# Patient Record
Sex: Male | Born: 1935
Health system: Southern US, Community
[De-identification: ages and names within clinical notes are randomized; demographics above are authoritative.]

## PROBLEM LIST (undated history)

## (undated) DIAGNOSIS — C189 Malignant neoplasm of colon, unspecified: Secondary | ICD-10-CM

## (undated) DIAGNOSIS — I509 Heart failure, unspecified: Secondary | ICD-10-CM

## (undated) DIAGNOSIS — F32A Depression, unspecified: Secondary | ICD-10-CM

## (undated) DIAGNOSIS — F329 Major depressive disorder, single episode, unspecified: Secondary | ICD-10-CM

## (undated) DIAGNOSIS — D649 Anemia, unspecified: Secondary | ICD-10-CM

## (undated) DIAGNOSIS — E119 Type 2 diabetes mellitus without complications: Secondary | ICD-10-CM

## (undated) DIAGNOSIS — I739 Peripheral vascular disease, unspecified: Secondary | ICD-10-CM

## (undated) DIAGNOSIS — I10 Essential (primary) hypertension: Secondary | ICD-10-CM

## (undated) DIAGNOSIS — J449 Chronic obstructive pulmonary disease, unspecified: Secondary | ICD-10-CM

## (undated) DIAGNOSIS — R0600 Dyspnea, unspecified: Secondary | ICD-10-CM

## (undated) DIAGNOSIS — E785 Hyperlipidemia, unspecified: Secondary | ICD-10-CM

## (undated) DIAGNOSIS — J189 Pneumonia, unspecified organism: Secondary | ICD-10-CM

## (undated) DIAGNOSIS — F0280 Dementia in other diseases classified elsewhere without behavioral disturbance: Secondary | ICD-10-CM

## (undated) DIAGNOSIS — G309 Alzheimer's disease, unspecified: Secondary | ICD-10-CM

## (undated) HISTORY — DX: Essential (primary) hypertension: I10

## (undated) HISTORY — DX: Dyspnea, unspecified: R06.00

## (undated) HISTORY — DX: Hyperlipidemia, unspecified: E78.5

## (undated) HISTORY — PX: SPINE SURGERY: SHX786

## (undated) HISTORY — DX: Pneumonia, unspecified organism: J18.9

## (undated) HISTORY — DX: Peripheral vascular disease, unspecified: I73.9

## (undated) HISTORY — PX: FEMORAL BYPASS: SHX50

## (undated) HISTORY — DX: Dementia in other diseases classified elsewhere without behavioral disturbance: F02.80

## (undated) HISTORY — PX: FEMORAL ARTERY - FEMORAL ARTERY BYPASS GRAFT: SUR179

## (undated) HISTORY — DX: Alzheimer's disease, unspecified: G30.9

## (undated) HISTORY — DX: Malignant neoplasm of colon, unspecified: C18.9

## (undated) HISTORY — PX: VASCULAR SURGERY: SHX849

## (undated) HISTORY — DX: Chronic obstructive pulmonary disease, unspecified: J44.9

## (undated) HISTORY — PX: FIBEROPTIC BRONCHOSCOPY: SHX5367

---

## 1994-03-26 HISTORY — PX: COLON SURGERY: SHX602

## 1995-03-27 HISTORY — PX: COLON RESECTION: SHX5231

## 1999-12-28 ENCOUNTER — Ambulatory Visit (HOSPITAL_COMMUNITY): Admission: RE | Admit: 1999-12-28 | Discharge: 1999-12-28 | Payer: Self-pay | Admitting: *Deleted

## 1999-12-28 ENCOUNTER — Encounter (INDEPENDENT_AMBULATORY_CARE_PROVIDER_SITE_OTHER): Payer: Self-pay | Admitting: *Deleted

## 2000-01-11 ENCOUNTER — Ambulatory Visit (HOSPITAL_COMMUNITY): Admission: RE | Admit: 2000-01-11 | Discharge: 2000-01-11 | Payer: Self-pay | Admitting: Family Medicine

## 2000-05-05 ENCOUNTER — Encounter: Payer: Self-pay | Admitting: Internal Medicine

## 2000-05-05 ENCOUNTER — Emergency Department (HOSPITAL_COMMUNITY): Admission: EM | Admit: 2000-05-05 | Discharge: 2000-05-05 | Payer: Self-pay | Admitting: Emergency Medicine

## 2001-03-11 ENCOUNTER — Encounter: Payer: Self-pay | Admitting: Vascular Surgery

## 2001-03-13 ENCOUNTER — Ambulatory Visit (HOSPITAL_COMMUNITY): Admission: RE | Admit: 2001-03-13 | Discharge: 2001-03-13 | Payer: Self-pay | Admitting: Vascular Surgery

## 2001-04-02 ENCOUNTER — Encounter: Payer: Self-pay | Admitting: Vascular Surgery

## 2001-04-02 ENCOUNTER — Inpatient Hospital Stay (HOSPITAL_COMMUNITY): Admission: RE | Admit: 2001-04-02 | Discharge: 2001-04-05 | Payer: Self-pay | Admitting: Vascular Surgery

## 2003-01-19 ENCOUNTER — Ambulatory Visit (HOSPITAL_COMMUNITY): Admission: RE | Admit: 2003-01-19 | Discharge: 2003-01-19 | Payer: Self-pay | Admitting: Gastroenterology

## 2003-01-19 ENCOUNTER — Encounter (INDEPENDENT_AMBULATORY_CARE_PROVIDER_SITE_OTHER): Payer: Self-pay | Admitting: *Deleted

## 2003-03-27 HISTORY — PX: LUMBAR DISC SURGERY: SHX700

## 2003-08-24 ENCOUNTER — Emergency Department (HOSPITAL_COMMUNITY): Admission: EM | Admit: 2003-08-24 | Discharge: 2003-08-24 | Payer: Self-pay | Admitting: Emergency Medicine

## 2004-05-14 ENCOUNTER — Inpatient Hospital Stay (HOSPITAL_COMMUNITY): Admission: EM | Admit: 2004-05-14 | Discharge: 2004-05-16 | Payer: Self-pay | Admitting: Emergency Medicine

## 2004-12-02 ENCOUNTER — Emergency Department (HOSPITAL_COMMUNITY): Admission: EM | Admit: 2004-12-02 | Discharge: 2004-12-02 | Payer: Self-pay | Admitting: Emergency Medicine

## 2005-03-14 ENCOUNTER — Encounter: Payer: Self-pay | Admitting: Emergency Medicine

## 2005-03-15 ENCOUNTER — Inpatient Hospital Stay (HOSPITAL_COMMUNITY): Admission: EM | Admit: 2005-03-15 | Discharge: 2005-03-17 | Payer: Self-pay | Admitting: Internal Medicine

## 2005-03-15 ENCOUNTER — Ambulatory Visit: Payer: Self-pay | Admitting: Pulmonary Disease

## 2005-03-16 ENCOUNTER — Ambulatory Visit: Payer: Self-pay | Admitting: Cardiovascular Disease

## 2005-06-12 ENCOUNTER — Ambulatory Visit: Payer: Self-pay

## 2006-01-28 ENCOUNTER — Ambulatory Visit: Payer: Self-pay | Admitting: Internal Medicine

## 2006-02-17 ENCOUNTER — Ambulatory Visit: Payer: Self-pay | Admitting: Emergency Medicine

## 2006-02-17 ENCOUNTER — Inpatient Hospital Stay (HOSPITAL_COMMUNITY): Admission: EM | Admit: 2006-02-17 | Discharge: 2006-02-27 | Payer: Self-pay | Admitting: Emergency Medicine

## 2006-03-06 ENCOUNTER — Ambulatory Visit: Payer: Self-pay | Admitting: Internal Medicine

## 2006-03-17 ENCOUNTER — Emergency Department (HOSPITAL_COMMUNITY): Admission: EM | Admit: 2006-03-17 | Discharge: 2006-03-17 | Payer: Self-pay | Admitting: Emergency Medicine

## 2006-03-21 ENCOUNTER — Ambulatory Visit: Payer: Self-pay | Admitting: Critical Care Medicine

## 2006-05-10 ENCOUNTER — Ambulatory Visit: Payer: Self-pay | Admitting: Internal Medicine

## 2006-08-13 ENCOUNTER — Ambulatory Visit: Payer: Self-pay | Admitting: Vascular Surgery

## 2006-09-26 ENCOUNTER — Encounter: Admission: RE | Admit: 2006-09-26 | Discharge: 2006-09-26 | Payer: Self-pay | Admitting: Family Medicine

## 2006-10-17 ENCOUNTER — Ambulatory Visit: Payer: Self-pay | Admitting: Vascular Surgery

## 2007-04-19 ENCOUNTER — Encounter: Admission: RE | Admit: 2007-04-19 | Discharge: 2007-04-19 | Payer: Self-pay | Admitting: Family Medicine

## 2007-05-06 ENCOUNTER — Telehealth (INDEPENDENT_AMBULATORY_CARE_PROVIDER_SITE_OTHER): Payer: Self-pay | Admitting: *Deleted

## 2007-05-07 DIAGNOSIS — I739 Peripheral vascular disease, unspecified: Secondary | ICD-10-CM

## 2007-05-07 DIAGNOSIS — I10 Essential (primary) hypertension: Secondary | ICD-10-CM

## 2007-05-07 DIAGNOSIS — E785 Hyperlipidemia, unspecified: Secondary | ICD-10-CM | POA: Insufficient documentation

## 2007-05-07 DIAGNOSIS — J449 Chronic obstructive pulmonary disease, unspecified: Secondary | ICD-10-CM

## 2007-05-07 DIAGNOSIS — R0602 Shortness of breath: Secondary | ICD-10-CM

## 2007-05-08 ENCOUNTER — Encounter: Payer: Self-pay | Admitting: Internal Medicine

## 2007-05-14 ENCOUNTER — Encounter: Admission: RE | Admit: 2007-05-14 | Discharge: 2007-05-14 | Payer: Self-pay | Admitting: Cardiology

## 2007-05-16 ENCOUNTER — Ambulatory Visit: Payer: Self-pay | Admitting: Internal Medicine

## 2007-05-20 ENCOUNTER — Ambulatory Visit (HOSPITAL_COMMUNITY): Admission: RE | Admit: 2007-05-20 | Discharge: 2007-05-20 | Payer: Self-pay | Admitting: Cardiology

## 2007-05-30 ENCOUNTER — Ambulatory Visit: Payer: Self-pay | Admitting: Internal Medicine

## 2007-06-02 DIAGNOSIS — R05 Cough: Secondary | ICD-10-CM

## 2007-06-26 ENCOUNTER — Encounter: Admission: RE | Admit: 2007-06-26 | Discharge: 2007-06-26 | Payer: Self-pay | Admitting: Internal Medicine

## 2007-07-01 ENCOUNTER — Ambulatory Visit: Payer: Self-pay | Admitting: Vascular Surgery

## 2007-07-11 ENCOUNTER — Encounter: Payer: Self-pay | Admitting: Internal Medicine

## 2007-07-24 ENCOUNTER — Ambulatory Visit: Payer: Self-pay | Admitting: Internal Medicine

## 2007-08-29 ENCOUNTER — Ambulatory Visit: Payer: Self-pay | Admitting: Internal Medicine

## 2007-09-30 ENCOUNTER — Inpatient Hospital Stay (HOSPITAL_COMMUNITY): Admission: EM | Admit: 2007-09-30 | Discharge: 2007-10-04 | Payer: Self-pay | Admitting: Emergency Medicine

## 2007-10-08 ENCOUNTER — Telehealth: Payer: Self-pay | Admitting: Internal Medicine

## 2008-01-02 ENCOUNTER — Ambulatory Visit: Payer: Self-pay | Admitting: Vascular Surgery

## 2008-01-28 ENCOUNTER — Inpatient Hospital Stay (HOSPITAL_COMMUNITY): Admission: RE | Admit: 2008-01-28 | Discharge: 2008-02-04 | Payer: Self-pay | Admitting: Neurosurgery

## 2008-07-15 ENCOUNTER — Telehealth (INDEPENDENT_AMBULATORY_CARE_PROVIDER_SITE_OTHER): Payer: Self-pay | Admitting: *Deleted

## 2008-08-18 ENCOUNTER — Ambulatory Visit: Payer: Self-pay | Admitting: Vascular Surgery

## 2008-12-01 ENCOUNTER — Telehealth (INDEPENDENT_AMBULATORY_CARE_PROVIDER_SITE_OTHER): Payer: Self-pay | Admitting: *Deleted

## 2008-12-16 ENCOUNTER — Encounter: Admission: RE | Admit: 2008-12-16 | Discharge: 2008-12-16 | Payer: Self-pay | Admitting: Internal Medicine

## 2009-01-06 ENCOUNTER — Ambulatory Visit: Payer: Self-pay | Admitting: Internal Medicine

## 2009-02-11 ENCOUNTER — Ambulatory Visit: Payer: Self-pay | Admitting: Vascular Surgery

## 2009-07-05 ENCOUNTER — Encounter: Admission: RE | Admit: 2009-07-05 | Discharge: 2009-07-05 | Payer: Self-pay | Admitting: Neurology

## 2009-07-27 ENCOUNTER — Encounter: Admission: RE | Admit: 2009-07-27 | Discharge: 2009-07-27 | Payer: Self-pay | Admitting: Neurosurgery

## 2009-09-14 ENCOUNTER — Telehealth (INDEPENDENT_AMBULATORY_CARE_PROVIDER_SITE_OTHER): Payer: Self-pay | Admitting: *Deleted

## 2010-01-09 ENCOUNTER — Encounter: Admission: RE | Admit: 2010-01-09 | Discharge: 2010-01-09 | Payer: Self-pay | Admitting: Internal Medicine

## 2010-01-23 ENCOUNTER — Ambulatory Visit: Payer: Self-pay | Admitting: Internal Medicine

## 2010-03-28 ENCOUNTER — Ambulatory Visit
Admission: RE | Admit: 2010-03-28 | Discharge: 2010-03-28 | Payer: Self-pay | Source: Home / Self Care | Attending: Vascular Surgery | Admitting: Vascular Surgery

## 2010-03-28 ENCOUNTER — Ambulatory Visit: Admit: 2010-03-28 | Payer: Self-pay | Admitting: Vascular Surgery

## 2010-04-16 ENCOUNTER — Encounter: Payer: Self-pay | Admitting: Neurology

## 2010-04-25 NOTE — Progress Notes (Signed)
Summary: sample  Phone Note Call from Patient   Caller: Patient Call For: wert Summary of Call: sample of symbicort Initial call taken by: Rickard Patience,  September 14, 2009 8:16 AM  Follow-up for Phone Call        1 sample given of symbicort lot [id number] exp 11/2010 Follow-up by: Lacinda Axon,  September 14, 2009 9:13 AM

## 2010-04-25 NOTE — Assessment & Plan Note (Signed)
Summary: Pulmonary/ ext ov with hfa teaching 90% p coaching   Primary Provider/Referring Provider:  Dorothyann Peng, MD  CC:  Dyspnea-the same.  History of Present Illness: 76   yo bm s/p smoking cessation in 1995 previously felt to have COPD with the last spirometry in the chart dated 11/18/2000 showing classic upper airway obstruction with no convincing evidence of significant COPD or other forms of intrathoracic airflow obstruction based on the effort independent portion of the FV loop  January 06, 2009 Followup with PFT's.  Pt states that overall breathing has been okay.  He gets SOB when he gets in a rush.  [name] states that pt gets out of breath just walking from one room to the next.  Also he c/o dry cough- especially at night x 4-6 weeks eval by [name] with cxr and rx cough syrup > no better.  no purulent sputum.   Try higher strength of Symbicort 160 2 puffs first thing  in am and 2 puffs again in pm about 12 hours later and if happy with it fill the prescription. as long as you are coughing take the zegerid otc at bedtime  >  seemed to help to pt and wife's satisfaction.  January 23, 2010 ov cc worse cough / sob since around 1st october p getting flu shot a week earlier and seen [name] twice rx zpak and cough syrup then cxr with copd only.  Pt denies any significant sore throat, dysphagia, itching, sneezing,  nasal congestion or excess secretions,  fever, chills, sweats, unintended wt loss, pleuritic or exertional cp, hempoptysis, change in activity tolerance  orthopnea pnd or leg swelling Pt also denies any obvious fluctuation in symptoms with weather or environmental change or other alleviating or aggravating factors.     saba helps some   Current Medications (verified): 1)  Aricept 10 Mg  Tabs (Donepezil Hcl) .... Take 1 Tablet By Mouth Once A Day 2)  Bayer Aspirin Ec Low Dose 81 Mg  Tbec (Aspirin) .... Take 1 Tablet By Mouth Once A Day 3)  Crestor 10 Mg Tabs (Rosuvastatin  Calcium) .Marland Kitchen.. 1 At Bedtime 4)  Benicar 40 Mg  Tabs (Olmesartan Medoxomil) .... Take 1 Tablet By Mouth Once A Day 5)  Albuterol Sulfate (2.5 Mg/31ml) 0.083% Nebu (Albuterol Sulfate) .Marland Kitchen.. 1 Vial in Nebulizer Once Daily As Needed 6)  Mucinex Dm 30-600 Mg  Tb12 (Dextromethorphan-Guaifenesin) .Marland Kitchen.. 1-2 Every 12 Hours As Needed 7)  Symbicort 160-4.5 Mcg/act  Aero (Budesonide-Formoterol Fumarate) .... 2 Puffs First Thing  in Am and 2 Puffs Again in Pm About 12 Hours Later 8)  Zegerid 20-1680 Mg Pack (Omeprazole-Sodium Bicarbonate) .... One At Bedtime As Long Coughing 9)  Metformin (? Strength) .Marland Kitchen.. 1 Once Daily 10)  Zoloft 50 Mg Tabs (Sertraline Hcl) .... 1/2 Once Daily  Allergies (verified): No Known Drug Allergies  Past History:  Past Medical History: HYPERLIPIDEMIA (ICD-272.4) PVD (ICD-443.9) HYPERTENSION NEC  DYSPNEA (ICD-786.05) COPD        PFT's  11/18/00  FEV1 is 1.94 or 60%     - PFT's 01/06/09  FEV1 1.75 (57%) ratio 49 and 21 % better FVC after B2  DLC0 71      - HFA 90% better p coaching 01/23/10  Vital Signs:  Patient profile:   76 year old male Weight:      186 pounds BMI:     25.32 O2 Sat:      98 % on Room air Temp:  97.8 degrees F oral Pulse rate:   58 / minute BP sitting:   104 / 68  (left arm)  Vitals Entered By: [name] (January 23, 2010 10:19 AM)  O2 Flow:  Room air  Physical Exam  Additional Exam:  elderly black male ambulatory but hoarse NAD Wt 180 > 187 January 06, 2009 > 186  01/23/10 HEENT mild turbinate edema.  Oropharynx no thrush or excess pnd or cobblestoning.  No JVD or cervical adenopathy. Mild accessory muscle hypertrophy. Trachea midline, nl thryroid. Chest was hyperinflated by percussion with diminished breath sounds and moderate increased exp time with trace bilateral mid expiratory wheeze. Hoover sign positive at mid inspiration. Regular rate and rhythm without murmur gallop or rub or increase P2. No edema  Abd: no hsm, nl excursion. Ext  warm without cyanosis or clubbing..     Impression & Recommendations:  Problem # 1:  COPD (ICD-496)   DDX of  difficult airways managment all start with A and  include Adherence, Ace Inhibitors, Acid Reflux, Active Sinus Disease, Alpha 1 Antitripsin deficiency, Anxiety masquerading as Airways dz,  ABPA,  allergy(esp in young), Aspiration (esp in elderly), Adverse effects of DPI,  Active smokers, plus one B  = Beta blocker use..   ? adherence:  worked on hfa technique > improved to 90% with coaching   ? acid reflux:  resume diet/ zegrid   Each maintenance medication was reviewed in detail including most importantly the difference between maintenance and as needed and under what circumstances the prns are to be used. See instructions for specific recommendations   Medications Added to Medication List This Visit: 1)  Zegerid 20-1680 Mg Pack (Omeprazole-sodium bicarbonate) .... One in am and one  at bedtime as long coughing for any reason 2)  Metformin (? Strength)  .Marland Kitchen.. 1 once daily 3)  Zoloft 50 Mg Tabs (Sertraline hcl) .... 1/2 once daily 4)  Albuterol Sulfate (2.5 Mg/60ml) 0.083% Nebu (Albuterol sulfate) .Marland Kitchen.. 1 treatment every 4 hours if needed for breathing or real congested cough 5)  Albuterol Sulfate (2.5 Mg/54ml) 0.083% Nebu (Albuterol sulfate) .Marland Kitchen.. 1 vial in nebulizer once daily as needed 6)  Prednisone 10 Mg Tabs (Prednisone) .... 4 each am x 2days, 2x2days, 1x2days and stop  Complete Medication List: 1)  Aricept 10 Mg Tabs (Donepezil hcl) .... Take 1 tablet by mouth once a day 2)  Bayer Aspirin Ec Low Dose 81 Mg Tbec (Aspirin) .... Take 1 tablet by mouth once a day 3)  Crestor 10 Mg Tabs (Rosuvastatin calcium) .Marland Kitchen.. 1 at bedtime 4)  Benicar 40 Mg Tabs (Olmesartan medoxomil) .... Take 1 tablet by mouth once a day 5)  Symbicort 160-4.5 Mcg/act Aero (Budesonide-formoterol fumarate) .... 2 puffs first thing  in am and 2 puffs again in pm about 12 hours later 6)  Zegerid 20-1680 Mg Pack  (Omeprazole-sodium bicarbonate) .... One in am and one  at bedtime as long coughing for any reason 7)  Metformin (? Strength)  .Marland Kitchen.. 1 once daily 8)  Zoloft 50 Mg Tabs (Sertraline hcl) .... 1/2 once daily 9)  Albuterol Sulfate (2.5 Mg/63ml) 0.083% Nebu (Albuterol sulfate) .Marland Kitchen.. 1 treatment every 4 hours if needed for breathing or real congested cough 10)  Mucinex Dm 30-600 Mg Tb12 (Dextromethorphan-guaifenesin) .Marland Kitchen.. 1-2 every 12 hours as needed 11)  Prednisone 10 Mg Tabs (Prednisone) .... 4 each am x 2days, 2x2days, 1x2days and stop  Other Orders: Est. Patient Level IV ([address]109)  Patient Instructions: 1)  Work on perfect  inhaler technique:  relax and blow all the way out then take a nice smooth deep breath back in, triggering the inhaler at same time you start breathing in  2)  Zergerid should be taken one in am and one in pm until no coughing at all (acid reflux is to cough what gasoline is to fire)  3)  GERD (REFLUX)  is a common cause of respiratory symptoms. It commonly presents without heartburn and can be treated with medication, but also with lifestyle changes including avoidance of late meals, excessive alcohol, smoking cessation, and avoid fatty foods, chocolate, peppermint, colas, red wine, and acidic juices such as orange juice. NO MINT OR MENTHOL PRODUCTS SO NO COUGH DROPS  4)  USE SUGARLESS CANDY INSTEAD (jolley ranchers)  5)  NO OIL BASED VITAMINS  6)  Use nebulizer if needed up to every 4 hours but should be able to resume previous level of use (rare) 7)  Prednisone 10 mg  4 each am x 2days, 2x2days, 1x2days and stop  8)  Please schedule a follow-up appointment as needed. Prescriptions: PREDNISONE 10 MG  TABS (PREDNISONE) 4 each am x 2days, 2x2days, 1x2days and stop  #14 x 0   Entered and Authorized by:   Nyoka Cowden MD   Signed by:   Nyoka Cowden MD on 01/23/2010   Method used:   Electronically to        [facility] [address] (retail)       [phone]       [place], [place]  [address]       Ph: [phone]       Fax: [phone]   RxID:   [id number]

## 2010-08-08 NOTE — H&P (Signed)
NAME:  [name], [name] NO.:  [id number]   MEDICAL RECORD NO.:  [id number]          PATIENT TYPE:  INP   LOCATION:  1436                         FACILITY:  [facility]   PHYSICIAN:  Lucita Ferrara, MD         DATE OF BIRTH:  28-Nov-1934   DATE OF ADMISSION:  09/29/2007  DATE OF DISCHARGE:                              HISTORY & PHYSICAL   The patient is a 76 year old African American male who presents to the  [facility] with a chief complaint of headache that is  intractable in nature.  The patient has a history of Alzheimer's  dementia.  Per family, it has been ongoing, with a progressive yet slow  decline since 2006-2007.  The patient has been evaluated by neurology as  an outpatient.  Family tells me that he has been doing quite well until  several months ago, after which he sustained a fall in [place].  He  then presented to [facility] and followed up here  thereafter.  He has seen orthopedic surgery in regards to his  intractable back pain status post a fall, and in review of his scans he  does look like he has severe spinal stenosis along the posterior  vertebral bodies and severe impinging L5 nerve root.  He is status post  epidural injections, with no relief.  Family tells me that his decline  in his mental status occurred status post the fall, although he had no  head trauma status post fall.  His current headache is accompanied by  generalized weakness and low-grade fever that is 99.2 per family.  He  also has some cough.  He has a history of chronic obstructive pulmonary  disease, not O2 dependent.  Cough is nonproductive.  There is no dysuria  or urinary incontinence.  His weakness is really generalized.  He has no  focal neurological deficits.  There is no posturing or seizure activity  noted.  His headache and generalized weakness is accompanied by  difficulty ambulating and, per family, it is hard to tell whether the  patient's difficulty in ambulating is secondary to his back pain or to  generalized weakness.  His headache is bilateral neck and back, and it  is 9/10 in intensity.  Otherwise, the rest of his review of systems is  only taking from his family's interpretation of his symptoms, and he  cannot tell me any other history secondary to his dementia, but he tells  me he has no stiff neck.  There were no documented fevers.  He has no  focal neurological deficits.  There is no recent travel.  No sick  contacts.   PAST MEDICAL HISTORY:  1. COPD.  2. Hypertension.  3. Emphysema.  4. Alzheimer's dementia.  5. Spinal stenosis.  6. Peripheral vascular disease, status post femoral-femoral bypass and      femoral-bipopliteal bypass graft.  7. Hypertension.   SOCIAL HISTORY:  He is a former smoker.  Currently, he lives with his  wife.  He has a very supportive family.  He denies  drugs or alcohol.  No  current smoking.  He is a retired Physicist, medical.   ALLERGIES:  NO KNOWN DRUG ALLERGIES.   MEDICATIONS AT HOME:  1. Aricept 10 mg p.o. daily  2. Dicyclomine 20 mg p.o. daily.  3. Hydrochlorothiazide 25 mg p.o. daily.  4. Advair 250/50, 1 puff p.o. daily.  5. Aspirin 325 mg daily.  6. Klor-Con 10 mEq p.o. daily.  7. Crestor 20 mg daily.  8. Verapamil 120 mg p.o. daily.  9. Benicar.  10.As needed, he takes ProAir and Mucinex.   REVIEW OF SYSTEMS:  Otherwise negative for 12 points.  He denies any  chest pain, palpitation, shortness of breath.  He denies any nausea,  vomiting, or focal abdominal pain.   PHYSICAL EXAMINATION:  GENERAL:  The patient is in no acute distress.  He seems well hydrated, stated age.  HEENT: Normocephalic, atraumatic.  Sclera anicteric.  NECK:  Supple.  No JVD, no carotid bruits.  PERRLA.  Extraocular muscles  intact.  CARDIOVASCULAR:  S1, S2.  Regular rate and rhythm.  No murmurs, rubs, or  clicks.  LUNGS:  There are bilateral expiratory wheezes that are  mild, diffuse,  in all lung fields.  Upon coughing, there are coarse breath sounds  throughout.  EXTREMITIES:  There is no clubbing, cyanosis, or edema.  NEUROLOGIC:  Cranial nerves II-XII grossly intact.  There is no facial  droop.  There is no nystagmus.  SKIN:  There are no rashes.   EKG:  Rate 82, no ST-T wave changes, unchanged from prior EKG.  The  patient had a chest x-ray which showed COPD, emphysema, without evidence  of acute cardiopulmonary disease.  The patient had a CT scan of the head  secondary to altered mental status and headache which showed no  significant interval changes.  There is stable hydrocephalus when  compared to previous examination, MRI dated September 26, 2006, and there is  stable small vessel disease.   ASSESSMENT AND PLAN:  A 76 year old with:  1. Headache, intractable in nature.  The patient has a CT scan finding      normal-pressure hydrocephalus, unchanged from the past.  No focal      neurological deficits.  2. Altered mental status from baseline, progressive yet gradual      decline, with known history of Alzheimer's disease.  3. Decrease in ambulation/generalized weakness, with known history of      back pain secondary to spinal stenosis, status post evaluation by      orthopedic outpatient, status post epidural injections, with no      improvement  4. Chronic obstructive pulmonary disease, fairly compensated.  5. Cough, low-grade fevers.  Rule out influenza H1N1.  6. Peripheral vascular disease, stable.   PLAN:  Will go ahead and admit the patient to the medical telemetry  unit.  Neuro checks.  Question if the patient needs an MRI, MRA of the  brain to rule any focal lesions.  Will go ahead and institute altered  mental status workup, TSH, B12, folate, RPR.  We will get physical  therapy and occupational therapy involved.  I do think that most of the  patient's problems are gradual, nonfocal, and likely related to  combination of his Alzheimer's  dementia and back  pain.  Yet, because he is having headaches, we must rule out viral  etiologies, viral meningismus. He complains of no back pain or  photophobia to met, yet he is I suppose a poor historian.  The rest of  plans are dependent on his progress.  We may be needing to get neurology  involved at some point.      Lucita Ferrara, MD  Electronically Signed     RR/MEDQ  D:  09/30/2007  T:  09/30/2007  Job:  [id number]

## 2010-08-08 NOTE — Discharge Summary (Signed)
[name], [name]                ACCOUNT NO.:  [id number]   MEDICAL RECORD NO.:  [id number]          PATIENT TYPE:  INP   LOCATION:  1436                         FACILITY:  [facility]   PHYSICIAN:  Lonia Blood, M.D.DATE OF BIRTH:  22-Feb-1935   DATE OF ADMISSION:  09/29/2007  DATE OF DISCHARGE:  10/04/2007                               DISCHARGE SUMMARY   DISCHARGE DIAGNOSES:  1. Viral meningitis.      a.     Primary complaint of headache.      b.     Symptoms resolved with volume resuscitation.  2. Queried normal pressure hydrocephalus.      a.     No response to high-volume lumbar puncture.      b.     Neurology consultation.  3. Chronic low back pain.  4. Chronic obstructive pulmonary disease, well controlled.  5. Peripheral vascular disease, status post fem/fem bypass and fem/pop      bypass.  6. Alzheimer's type dementia.  7. Hypertension.  8. Severe lumbar spinal disease with spinal stenosis at the L5 nerve      root level.   ALLERGIES:  NO KNOWN DRUG ALLERGIES.   DISCHARGE MEDICATIONS:  1. Aricept 10 mg p.o. daily.  2. Crestor 20 mg p.o. daily  3. Dicyclomine 20 mg p.o. t.i.d.  4. Symbicort 2 puffs b.i.d.  5. Aspirin 325 mg p.o. daily.  6. Verapamil 120 mg p.o. daily.  7. Benicar 40 mg p.o. daily   FOLLOW UP:  The patient is advised to follow with his primary care  physician in 7-10 days.   PROCEDURE:  1. CT scan of the head September 29, 2007:  No significant interval change.      Stable hydrocephalus.  Correlate clinically with normal pressure      hydrocephalus.  Stable small vessel ischemic change.  2. MRI and MRA of the brain September 30, 2007:  No acute intracranial      abnormality.  Mild interval progression, nonspecific cerebral white      matter disease, favor small vessel ischemia.  Diffuse ventricular      enlargement is mildly out of portion to the degree of  generalized      volume loss.  This raises the possibility of normal pressure      hydrocephalus.   Clinical correlation is recommended.  Advanced      general volume loss.  Diffuse carotid siphon atherosclerosis with      mild stenosis of the right MCA origin.  No intracranial occlusions.  3. LP under fluoroscopic guidance October 02, 2007.   CONSULTATIONS:  1. Interventional radiology.  2. Neurology/[facility].   HOSPITAL COURSE:  Mr. [name] is a 76 year old gentleman with a  complex medical history as noted above.  With his dementia, he is cared  for by multiple family members including his wife.  He presented to the  hospital on date of admission for evaluation of significant decline in  mental status.  The patient had suffered a fall while visiting in [place] several months ago.  The patient's  family states that he has  simply been failing to thrive since that time.  However, there was no  head trauma appreciated.  The patient began to complain of significant  headaches and noted a low-grade fever at 99.2.  As a result, he was  brought in for evaluation.  The patient was admitted to the acute units.  CT scan of the head was carried out and revealed no evidence of acute  abnormality.  There was concern for the possibility of a CVA.  MRI and  MRA were accomplished.  This revealed no evidence of a CVA.  There was  significant lower extremity weakness appreciated, however.  Given the  concern of normal pressure hydrocephalus on MRI, decision was made to  evaluate further.  Neurology was consulted.  A high-volume LP was  carried out using fluoroscopic guidance in radiology.  In it, there was  concern for viral meningitis.  Not only was this carried out for  evaluation of NPH, but it was also carried out to allow diagnostic  evaluation of cerebrospinal fluid.  While the patient's lower extremity  weakness and mental status did not improve to a significant extent  status post his spinal tap, the lab essay of the obtained fluid was in  fact consistent with  viral meningitis.  Ultimately it is not felt that  the patient suffers with normal pressure hydrocephalus.  Even if he  does, it is not felt that a VP shunt would likely bring about any change  in symptoms at this point.  With ongoing hydration, however, the  patient's symptoms improved significantly in regard to his headache.  This was consistent with resolution of a viral meningitis.  For  completeness sake, an urinalysis was also obtained was found to be  normal.  Sed rate was normal with a normal TSH and a normal B12 level.  Some difficulty was encountered with a moderate hyponatremia during his  hospital stay.  This was felt to be related to the patient's HCTZ  therapy.  This was discontinued and the patient's blood pressure  remained in the 120-130 systolic range without it.  It is therefore not  felt that he needs to continue this medication in the outpatient  setting.   By October 04, 2007, the patient is deemed medically stable for discharge.  He has been suffering some alteration in mental status which is felt to  be due to sundowning.  This has been discussed with the patient's family  and it is explained that this will not improve unless he is able to be  discharged to his home environment.  All arrangements have been made to  assist the family with home health.  The patient is now cleared for  discharge home on the above listed medical regimen.      Lonia Blood, M.D.  Electronically Signed     JTM/MEDQ  D:  10/04/2007  T:  10/04/2007  Job:  [id number]

## 2010-08-08 NOTE — Assessment & Plan Note (Signed)
OFFICE VISIT   [name], [name]  DOB:  [birth year]                                       07/01/2007  ZOXWR#:[id number]   Patient previously underwent right to left femoral-femoral bypass and  left femoropopliteal bypass grafting by me in 2003.  Both grafts are  patent.  He does apparently complain of a lot of his legs without his  legs hurting when he walks at home and is very frustrating for his wife.  He does have some degree of dementia.  He tells me that he is able to  walk from the examining room to the parking lot without discomfort but  tells his wife he is only able to walk short distances.  It is unclear  whether the right leg is worse than the left.  He recently had a cardiac  cath by Dr. Jacinto Halim, who looked at his aorta, and his right to left  femoral-femoral graft, which looked widely patent, but he did not look  below that level.   PHYSICAL EXAMINATION:  Blood pressure 121/69, heart rate 65,  respirations are 14.  Carotid pulses are 3+ with no audible bruits.  Neurologic exam is normal.  He has 2-3+ femoral pulses bilaterally.  Both feet are well perfused with no evidence of any ischemia.   ABIs on both legs have gradually diminished, the right leg being 0.47,  the left leg being 0.68, which are slightly down from July, 2008.   I suspect he has a right superficial femoral occlusion, and his left  femoropopliteal bypass graft is patent, but he has distal disease.  I am  not sure that a right femoropopliteal graft would improve his overall  situation at all, and I discussed that at length with his wife.  If his  symptoms worsen on the right side, we can consider angiography and  femoropopliteal bypass grafting, but I would not recommend at this time.  He will return on a regular basis for the protocol.   [name], M.D.  Electronically Signed   JDL/MEDQ  D:  07/01/2007  T:  07/02/2007  Job:  [id number]

## 2010-08-08 NOTE — Discharge Summary (Signed)
[name], [name]                ACCOUNT NO.:  [id number]   MEDICAL RECORD NO.:  [id number]          PATIENT TYPE:  INP   LOCATION:  3027                         FACILITY:  [facility]   PHYSICIAN:  Coletta Memos, M.D.     DATE OF BIRTH:  [birth year]   DATE OF ADMISSION:  01/28/2008  DATE OF DISCHARGE:  02/04/2008                               DISCHARGE SUMMARY   ADMITTING DIAGNOSES:  1. Lumbar stenosis L4-5.  2. Lumbar spondylolisthesis L4-5.  3. Lumbar spondylosis L4-5 without myelopathy.  4. Dementia.   DISCHARGE DIAGNOSES:  1. Lumbar stenosis L4-5.  2. Lumbar spondylolisthesis L4-5.  3. Lumbar spondylosis L4-5 without myelopathy.   PROCEDURE:  Posterior lumbar interbody arthrodesis with Synthes PEEK  cages filled with morselized allograft 15 x 24 cages and a spider plate  placement large.   COMPLICATIONS:  None.   DISCHARGE STATUS:  Alive and well.   [name] is a very pleasant gentleman who presents with severe  stenosis at L4-5.  He is demented.  His wife states that he has a great  deal of pain in his lower extremities whenever he stands or walks and  see whether or not any surgical intervention was possible.  He had a  good deal of stenosis present at L4-5 with facet arthropathy.  I felt  that in order to perform an adequate decompression, a fusion was  necessary as a result to removing enough bone to make the decompression  adequate.  She is agreed.  He was admitted and taken to the operating  room and had an uncomplicated procedure.  Postoperatively, he did well.  He will receive home health PT.  He is voiding and ambulating at  discharge.  His wound is clean and dry without signs of infection.           ______________________________  Coletta Memos, M.D.     KC/MEDQ  D:  02/04/2008  T:  02/04/2008  Job:  [id number]

## 2010-08-08 NOTE — Procedures (Signed)
BYPASS GRAFT EVALUATION   INDICATION:  Follow-up bypass graft.  Patient complaining of increasing  leg pain bilaterally.   HISTORY:  Diabetes:  No.  Cardiac:  No.  Hypertension:  No.  Smoking:  No.  Previous Surgery:  Left femoral-to-popliteal artery bypass graft and  right to left femoral-femoral bypass graft with Gore-Tex on 04/02/01 by  Dr. Hart Rochester.   SINGLE LEVEL ARTERIAL EXAM                               RIGHT              LEFT  Brachial:                    144                140  Anterior tibial:             44                 96  Posterior tibial:            68                 98  Peroneal:  Ankle/brachial index:        0.47               0.68   PREVIOUS ABI:  Date: 10/17/06  RIGHT:  0.45  LEFT:  0.81   LOWER EXTREMITY BYPASS GRAFT DUPLEX EXAM:   DUPLEX:  Doppler arterial waveforms are monophasic proximal to,  throughout, and distal to the graft.  Velocities are within normal  limits in the femoral-femoral bypass graft and left femoral-to-popliteal  artery bypass graft.   IMPRESSION:  1. Patent femoral-femoral bypass graft.  2. Patent left femoral-popliteal artery bypass graft.  3. Right ankle brachial index stable.  4. Left ankle brachial index decreased from previous examination on      10/17/06.   ___________________________________________  Quita Skye. Hart Rochester, M.D.   DP/MEDQ  D:  07/01/2007  T:  07/01/2007  Job:  [id number]

## 2010-08-08 NOTE — Procedures (Signed)
BYPASS GRAFT EVALUATION   INDICATION:  Follow-up evaluation of lower extremity bypass graft.   HISTORY:  Diabetes:  No.  Cardiac:  No.  Hypertension:  No.  Smoking:  No.  Previous Surgery:  Left fem-pop bypass graft and right-to-left fem-fem  bypass graft with Gore-Tex on 04/02/01 by Dr. Hart Rochester.   SINGLE LEVEL ARTERIAL EXAM                               RIGHT              LEFT  Brachial:                    148                150  Anterior tibial:  Posterior tibial:            80                 112  Peroneal:                    74                 136  Ankle/brachial index:        0.53               0.91   PREVIOUS ABI:  Date: 07/01/07  RIGHT:  0.47  LEFT:  0.68   LOWER EXTREMITY BYPASS GRAFT DUPLEX EXAM:   DUPLEX:  Doppler arterial waveforms are biphasic proximal to, within,  and distal to the right iliac fem-fem bypass graft and left fem-pop  bypass graft.   IMPRESSION:  1. Right ankle brachial index is stable from previous study.  2. The left ankle brachial index is higher than previously recorded.  3. Patent right-to-left femoral-femoral bypass graft.  4. Patent left femoropopliteal bypass graft.   ___________________________________________  Larina Earthly, M.D.   MC/MEDQ  D:  01/02/2008  T:  01/02/2008  Job:  [id number]

## 2010-08-08 NOTE — Procedures (Signed)
BYPASS GRAFT EVALUATION   INDICATION:  Followup lower extremity bypass grafts.   HISTORY:  Diabetes:  No.  Cardiac:  No.  Hypertension:  No.  Smoking:  No.  Previous Surgery:  Left femoral-popliteal artery bypass graft and (right  to left) fem-fem bypass graft 04/02/2001 by Dr. Hart Rochester.   SINGLE LEVEL ARTERIAL EXAM                               RIGHT              LEFT  Brachial:                    156                155  Anterior tibial:             85                 132  Posterior tibial:            96                 133  Peroneal:                    94                 130  Ankle/brachial index:        0.62               0.85   PREVIOUS ABI:  Date:  01/02/2008  RIGHT:  0.53  LEFT:  0.91   LOWER EXTREMITY BYPASS GRAFT DUPLEX EXAM:   DUPLEX:  Doppler arterial waveforms appear biphasic proximal to, within  and distal to fem-fem (right to left) and left femoral-popliteal artery  bypass grafts.   IMPRESSION:  1. Patent right to left fem-fem bypass graft.  2. Patent left fem-pop bypass graft.  3. ABIs appear stable bilaterally.  4. No significant changes from previous study.   ___________________________________________  [name], M.D.   AS/MEDQ  D:  08/18/2008  T:  08/18/2008  Job:  [id number]

## 2010-08-08 NOTE — Procedures (Signed)
BYPASS GRAFT EVALUATION   INDICATION:  Bilateral claudication.  Symptoms have not changed since  his last doctor visit in May 2008.   HISTORY:  Diabetes:  No  Cardiac:  No  Hypertension:  No  Smoking:  No  Previous Surgery:  Left fem-pop bypass graft and right to left fem-fem  bypass graft on April 02, 2001, by Dr. Hart Rochester.   SINGLE LEVEL ARTERIAL EXAM                               RIGHT              LEFT  Brachial:                    157                161  Anterior tibial:             68                 118  Posterior tibial:            73                 131  Peroneal:  Ankle/brachial index:        0.45               0.81   PREVIOUS ABI:  Date: Aug 13, 2006  RIGHT:  0.72  LEFT:  > 1.0   LOWER EXTREMITY BYPASS GRAFT DUPLEX EXAM:   DUPLEX:  Arterial Doppler waveforms are biphasic proximal to, within and  distal to the fem-fem bypass graft and fem-pop bypass graft.   The tibial artery Doppler signal in the right leg was monophasic.  There  appears to be calcified plaque throughout the right superficial femoral  artery.   IMPRESSION:  ABIs are lower than previously recorded bilaterally.   Patent right to left fem-fem bypass graft.   Patent left fem-pop bypass graft.   ___________________________________________  Quita Skye. Hart Rochester, M.D.   MC/MEDQ  D:  10/17/2006  T:  10/18/2006  Job:  [id number]

## 2010-08-08 NOTE — Op Note (Signed)
NAMEBRAYSON, [name]                ACCOUNT NO.:  [id number]   MEDICAL RECORD NO.:  [id number]          PATIENT TYPE:  INP   LOCATION:  3040                         FACILITY:  [facility]   PHYSICIAN:  Coletta Memos, M.D.     DATE OF BIRTH:  15-[name]-1934   DATE OF PROCEDURE:  DATE OF DISCHARGE:                               OPERATIVE REPORT   PREOPERATIVE DIAGNOSES:  Lumbar stenosis L4-5, lumbar spondylolisthesis  L4-5, lumbar spondylosis L4-5 without myelopathy.   POSTOPERATIVE DIAGNOSES:  Lumbar stenosis L4-5, lumbar spondylolisthesis  L4-5, lumbar spondylosis L4-5 without myelopathy.   PROCEDURE:  1. Posterior lumbar interbody arthrodesis with 15- x 24-mm Synthes      PEEK cages filled with morselized allograft, disk space also packed      with morselized allograft.  2. SPIRE plate placement.   INDICATIONS:  [name] is a 76 year old gentleman who presented with  severe pain in his back and lower extremities.  MRI showed significant  lumbar stenosis at L4-5 and facet arthropathy.  Secondary to this, I  recommended and he agreed to undergo operative decompression at L4-5.   OPERATIVE NOTE:  Mr. Edelen was brought to the operating room,  intubated, and placed under general anesthetic.  Foley catheter was  placed under sterile conditions also.  He was rolled prone onto a  Jackson table and all pressure points were properly padded.  His back  was prepped, and he was draped in a sterile fashion.  I opened the skin  with a #10 blade and took this down to the thoracolumbar fascia.  I then  was able to positively identify the L4-5 space.  I then proceeded with a  high-speed drill to perform a semihemilaminectomy of L4 and L5  bilaterally.  This was done, and I removed the ligamentum flavum.  I  exposed the thecal sac and then proceeded to perform diskectomies.   With the interbody arthrodesis, I performed very aggressive diskectomies  bilaterally.  I used curettes, pituitary rongeurs  and disk space shavers  from Synthes.  I removed enough material and had decorticated the bony  surfaces so that I thought I was ready for placement of the cages.  I  first placed a 12-mm cage and felt that was too small.  A 13-mm cage was  placed on the wound left side.  Then, I placed one on the right side and  that actually was again too loose.  I then went up to the 15-mm cages  placing it first on the right side.  That was packed with morselized  allograft.  I then removed the 13-mm cage and placed a 15-mm cage on the  left side, also packed with morselized allograft.  I then placed more  allograft lateral to the cages without difficulty.   I then placed a SPIRE plate between the L4 and L5 spinous processes.  It  was a large plate.  This was locked into position.  X-ray showed that  the plate and interbody cages were in good position.  I then closed the  wound in layered  fashion using Vicryl  sutures to reapproximate the thoracolumbar fascia.  I placed sutures in the subcutaneous and subcuticular layers.  I used  Dermabond for sterile dressing.  The patient was extubated, brought to  the recovery room in stable condition.           ______________________________  Coletta Memos, M.D.     KC/MEDQ  D:  01/28/2008  T:  01/29/2008  Job:  [id number]

## 2010-08-08 NOTE — Consult Note (Signed)
NAMERONON, FERGER                ACCOUNT NO.:  [id number]   MEDICAL RECORD NO.:  [id number]          PATIENT TYPE:  INP   LOCATION:  1436                         [facility]ACILITY:  [facility]   PHYSICIAN:  Catherine A. Orlin Hilding, M.D.DATE OF BIRTH:  1934/11/06   DATE OF CONSULTATION:  DATE OF DISCHARGE:                                 CONSULTATION   REASON FOR CONSULTATION:  Possible normal pressure hydrocephalus.   HISTORY OF PRESENT ILLNESS:  This patient is a 76 year old African-  American male who was admitted on September 29, 2007, due to having a headache  and abnormal gait.  He has a past medical history that is significant  for COPD, hypertension, emphysema, Alzheimer's, spinal stenosis,  peripheral vascular disease.  According to the information gathered from  the H&P in the chart due to the patient having decreased recall of  events, the patient had had a fall several months ago which initiated a  decline in his memory.  He has had a previous slow decline in memory;  however. his fall marked a definite decline in short-term memory,  according to his wife.  On admission, he had a temperature of 99.2, a  nonproductive cough, and a concurrent headache.  On physical examination  at admission, he stated he had a headache that was 3/10.  Today on exam  he states that his headache is approximately a 3/10, right greater than  left, localized over his right orbit.  He is slightly confused at this  time as to where he is.  He states that the headache is tight in  quality.  He has no nausea, vomiting, photophobia, phonophobia, tearing,  scotoma, double vision, blurred vision associated with his headache.  He  denies any current incontinence; however, the patient is slightly  confused at this time.  The patient does state that he does believe that  he had a problem in the past and he might have taken medication for  incontinence in the past.  He denies any trouble with walking, but if  asked if he has  noticed any difference in his walking he does state that  he feels as though there are magnets on his feet and he does admit to  having periods of imbalance.  This patient has been followed by [facility], by Dr. Vickey Huger, for his Alzheimer's and sleep disturbance in  the past.   PAST MEDICAL HISTORY:  COPD, hypertension, emphysema, Alzheimer's,  spinal stenosis, peripheral vascular disease.   SOCIAL HISTORY:  Positive former smoker.  Negative ETOH.  Lives with his  wife at the current time.   ALLERGIES:  Negative.   MEDICATIONS:  Aricept, dicyclomine, hydrochlorothiazide, Advair,  aspirin, Klor-Con, Crestor, verapamil and Benicar.   REVIEW OF SYSTEMS:  GENERAL:  The patient denies any weight gain, fever,  or weakness.  HEMATOLOGICAL:  Denies any anemia, increased bleeding or  coagulopathy.  HEENT:  He does admit to some lightheadedness and  dizziness.  No trauma.  No nausea, vomiting.  No diplopia, decrease or  loss of vision, tinnitus or hearing loss.  RESPIRATORY:  Does admit to  shortness of breath in the past.  No asthma or bronchitis.  CARDIOVASCULAR:  He admits to hypertension.  He denies palpitations,  angina, dyspnea on exertion, murmur.  GASTROINTESTINAL:  Negative for  diarrhea, constipation, abdominal pain.  GENITOURINARY:  Negative for  polyuria, dysuria, nocturia.  He does admit to possibly having urgency  in the past.  ENDOCRINE:  Negative for diabetes.  VASCULAR:  Positive  for peripheral vascular disease.  Negative for claudication or leg  edema.  MUSCULOSKELETAL:  Positive for joint pain, stiffness, and  arthritis.  NEUROLOGICAL:  Negative for numbness, tingling, blackouts.  Positive for dementia.  PSYCHIATRIC:  Negative for anxiety, depression,  but positive for decreased memory.   PHYSICAL EXAMINATION:  VITAL SIGNS:  Temperature 98, pulse 84, blood  pressure 124/66.  NEUROLOGIC:  The patient is confused, but he is alert at this time.  He  is not  oriented to place, date, city or month.  He is appropriately  dressed at this time.  He can hold a conversation but does have  difficulty with recent and past memory.  At this time, he is able to  recall 2 of 3 objects but unable to spell the word world  backwards.  He has difficulty finishing serial 7s.  He is capable of following 2-  step commands.  He scored on a Mini-Mental exam a score of 11.  Cranial  nerves:  PERRLA, EOMI.  TML.  Uvula midline.  Sensation intact.  Auditory nerve intact.  Smile equal and symmetrical.  Head turn and  shoulder shrug equal, 5/5 strength.  Coordination:  Finger to nose, heel  to shin are normal.  No orbiting.  Fast finger motion is normal.  Motor:  He has 5/5 strength globally.  No tremor.  Good tone and bulk.  Negative  clonus.  Negative asterixis.  His gait is wide-based and nonspecific.  He does have difficulty with tandem walking in which he loses balance  quite easily.  His reflexes are 1/4 on patella, Achilles, biceps,  triceps, brachialis.  He has downgoing toes.  He has a negative rooting  reflex.  Negative palmomental reflex.  Negative grasping reflex.  Sensation is globally intact to pinprick and light touch.  CARDIOVASCULAR:  Regular rate and rhythm.  S1 and S2.  RESPIRATORY:  Clear to auscultation bilaterally.  ABDOMEN:  Soft, nontender, nondistended.   LABORATORIES:  No new labs.   CT on September 29, 2007:  Head CT shows no significant internal changes,  stable hydrocephalus and stable small vessel changes.   MRI on September 30, 2007, shows no acute intracranial abnormalities.  Mild  progression of nonspecific white matter disease, diffuse ventricular  enlargement, mildly out of proportion, age-related generalized volume  loss, possibly normal pressure hydrocephalus.   ASSESSMENT/PLAN:  This is a pleasant 72-year African American male who  has had progressive dementia over the last few years, recently was  admitted due to headache and flu-like  symptoms.  MRI does exhibit  enlarged ventricles out of proportion to the degreeof atrophy he has at  his age.  Given that he does have dementia and difficulty recalling  recent events, it is not inappropriate to consider normal pressure  hydrocephalus.  At this time, he is scheduled to receive a lumbar  puncture tomorrow morning in conjunction with physical therapy who will  assess his gait before and after to note any changes.  If no changes are  noted, most likely this is not normal pressure hydrocephalus.  If  changes are noted, consider consulting neurosurgery for shunt placement.  As stated, if no changes, have followup in the office with Dr. Porfirio Mylar  Dohmeier as scheduled.     ______________________________  Felicie Morn, PA      Gustavus Messing. Orlin Hilding, M.D.  Electronically Signed    DS/MEDQ  D:  10/01/2007  T:  10/01/2007  Job:  [id number]

## 2010-08-08 NOTE — Procedures (Signed)
BYPASS GRAFT EVALUATION   INDICATION:  Followup femoral to femoral with Dacron material graft and  left common femoral artery to popliteal artery bypass graft.   HISTORY:  Diabetes:  Yes.  Cardiac:  No.  Hypertension:  Yes.  Smoking:  Previous.  Previous Surgery:  Femoral to femoral with Dacron graft and a left  common femoral to popliteal bypass graft with Gore-Tex material.   SINGLE LEVEL ARTERIAL EXAM                               RIGHT              LEFT  Brachial:                    164                176  Anterior tibial:             Inaudible          Inaudible  Posterior tibial:            120                116  Peroneal:  Ankle/brachial index:        0.68               0.66   PREVIOUS ABI:  Date:  02/11/2009  RIGHT:  0.45  LEFT:  0.65   LOWER EXTREMITY BYPASS GRAFT DUPLEX EXAM:   DUPLEX:  Patent femoral to femoral, left common femoral to popliteal  bypass graft with biphasic and monophasic waveforms and normal  velocities.   IMPRESSION:  Stable ankle brachial indices.  Patent bypass grafts with  normal velocities, biphasic and monophasic waveforms.         ___________________________________________  [name], M.D.   OD/MEDQ  D:  03/31/2010  T:  03/31/2010  Job:  [id number]

## 2010-08-08 NOTE — Procedures (Signed)
BYPASS GRAFT EVALUATION   INDICATION:  Followup of lower extremity bypass graft.   HISTORY:  Diabetes:  No.  Cardiac:  No.  Hypertension:  No.  Smoking:  No.  Previous Surgery:  Left fem to popliteal bypass graft, right to left fem-  fem bypass graft.   SINGLE LEVEL ARTERIAL EXAM                               RIGHT              LEFT  Brachial:                    164                165  Anterior tibial:             74                 65  Posterior tibial:            69                 59  Peroneal:  Ankle/brachial index:        0.45               0.65   PREVIOUS ABI:  Date:  08/18/2008  RIGHT:  0.62  LEFT:  0.85   LOWER EXTREMITY BYPASS GRAFT DUPLEX EXAM:   DUPLEX:  Biphasic Doppler waveforms throughout both bypass grafts and  native arteries.   IMPRESSION:  1. Patent bypass graft with no evidence of stenosis.  2. Ankle brachial index suggests moderate to severe disease      bilaterally.   ___________________________________________  Larina Earthly, M.D.   CJ/MEDQ  D:  02/11/2009  T:  02/11/2009  Job:  [id number]

## 2010-08-08 NOTE — Cardiovascular Report (Signed)
[name], [name]                ACCOUNT NO.:  [id number]   MEDICAL RECORD NO.:  [id number]          PATIENT TYPE:  OIB   LOCATION:  2899                         FACILITY:  [facility]   PHYSICIAN:  Cristy Hilts. Jacinto Halim, MD       DATE OF BIRTH:  1934/07/22   DATE OF PROCEDURE:  05/20/2007  DATE OF DISCHARGE:                            CARDIAC CATHETERIZATION   OPERATION/PROCEDURE:  1. Left heart catheterization including obtuse marginal 1, left.  2. Selective left coronary angiography.  3. Abdominal aortogram.   INDICATIONS:  [name] is a 76 year old gentleman with known  peripheral arterial disease.  He has previously remotely undergone a  right femoral to left femoral bypass and a left femoral to brachial  bypass surgery above the knee January 2003.  He presents for evaluation  of an abnormal stress test and shortness of breath.  The stress test  revealing severe diaphragmatic attenuation artifact with mild ischemia.  Given his peripheral arterial disease, hypertension, hyperlipidemia, he  is now brought back to the cardiac catheterization lab to evaluate his  coronary anatomy.  Abdominal aortogram is performed to evaluate for  abdominal atherosclerosis and to evaluate graft patency as he does have  claudication.   HEMODYNAMIC DATA:  Left ventricular pressure was 129/10 mmHg.  Aortic  pressure was 124/57 with a mean of 85 mmHg.  There is no pressure  gradient across the aortic valve.   ANGIOGRAPHIC DATA:  Left ventricle.  Left ventricular systolic function  was at the lower limit of normal.  Ejection fraction of 50-55% with mild  global hypokinesis.  There is no significant mitral regurgitation.   Right coronary artery. The right coronary artery is a large vessel and  dominant vessel.  It had mild calcification, but is otherwise smooth and  normal.   Left main.  The left main is large vessel.  Smooth and normal.   Left anterior descending.  The left anterior descending is a  large  vessel.  There is ostial and proximal 30% stenosis, but otherwise shows  moderate amount of diffuse calcification.  However, there is no luminal  obstruction.  The LAD gives origin to several small diagonals.   Circumflex artery.  The circumflex artery is a large vessel that origin  to several small obtuse marginals.  Smooth and normal.   ABDOMINAL AORTOGRAM:  Abdominal aortogram revealed presence of two renal  arteries on the left, one renal artery on the right and they are widely  patent.  The aortoiliac bifurcation is widely patent on the right and  left.  The common iliac artery is occluded 100%.   The right femoral-femoral bypass graft is also widely patent.   IMPRESSION:  False positive stress Myoview, probably related to severe  diaphragmatic attenuation artifact.  Hence, ischemia could not be  excluded.  He did have positive EKG response to stress test.  However,  the cardiac catheterization did not reveal any significant coronary  disease.  There is moderate amount of calcification of the LAD,  but no  luminal obstruction.   RECOMMENDATIONS:  The patient will be continued  on aggressive risk  modification.  I suspect his shortness of breath is probably related to  his underlying COPD.   A total of 100 mL of contrast was utilized for diagnostic angiography.   DATA:  Using the usual sterile precautions using a micropuncture  technique and a left brachial approach a short 6-French sheath was  introduced in the brachial artery by micropuncture technique.  A 6-  [place] multipurpose B2 catheter was advanced to the ascending aorta and  then into the left ventricle.  Left ventriculography was performed both  in LAO and RAO projection.  The catheter was then pulled into the  ascending and right coronary selectively engaged angiography was  performed.  Then the left main coronary artery selectively engaged  angiography was performed.  Then the catheter was then pulled back  into  the arch of the aorta and was advanced into the descending aorta over a  J-wire and abdominal aortogram was performed.  Then the catheter was  pulled out of body in the usual fashion.  During the procedure, 2000  units of heparin was administered via brachial sheath.  There was no  immediate complication noted.  I did attempt a right femoral approach.  However, because of inability to obtain access, I proceeded with  obtaining left brachial approach.      Cristy Hilts. Jacinto Halim, MD  Electronically Signed     JRG/MEDQ  D:  05/20/2007  T:  05/21/2007  Job:  [id number]   cc:   R. Roetta Sessions, M.D.  [name], M.D.

## 2010-08-08 NOTE — Assessment & Plan Note (Signed)
OFFICE VISIT   MACKENZIE, [name]  DOB:  January 05, 1935                                       03/28/2010  ZOXWR#:[id number]   Patient is a 76 year old male patient known to me from previous right  and left femoral-femoral bypass grafting and left femoral-popliteal  bypass grafting with Gore-Tex performed January 2003 for severe  claudication in the left leg.  He has done well since that time and has  continued to have patent bypass graft over the years through  surveillance.  A few weeks ago he had some pain in his left leg when  trying to arise, and this occurred a few days in a row.  His wife states  he also had some cramping in his legs at night, requiring him to get up  and ambulate.  She states that the discomfort in the leg was better as  the day progressed, and he walked more.  He has no history of infection,  ulcer, or gangrene.   CHRONIC MEDICAL PROBLEMS:  1. Diabetes.  2. Hypertension.  3. Hyperlipidemia.  4. COPD.  5. Dementia.  6. History of colon cancer.   SOCIAL HISTORY:  The patient is married, has 3 children, is retired.  Does not use tobacco or alcohol.   FAMILY HISTORY:  Positive for coronary artery disease in a brother who  had a massive myocardial infarction.   REVIEW OF SYSTEMS:  Positive for asthma, leg discomfort with instability  at times, dyspnea on exertion, urinary frequency, decreased memory.  All  other systems are negative.   PHYSICAL EXAMINATION:  Blood pressure 143/78, heart rate 69,  respirations 20.  Generally, he is a chronically ill-appearing elderly  male in no apparent distress, alert and oriented x3.  General:  He is a  well-developed and well-nourished male in no apparent distress, alert  and oriented x3.  HEENT:  Normal for age.  EOMs intact.  Lungs:  Clear  to auscultation.  No rhonchi or wheezing.  Cardiovascular:  Regular  rhythm.  No murmurs.  Carotid pulses are 3+.  No bruits.  Abdomen:  Soft, nontender with  no masses.  He has a 3+ pulse in the right-to-left  femoral-femoral graft and a 2+ left femoral-popliteal graft pulse at the  knee level.  Both feet are adequately perfused.   Today I ordered lower extremity arterial Dopplers which revealed patency  of both bypass grafts.  An ABI of 0.68 on the right and 0.66 on the  left.  I have reassured him regarding these findings.  He does have  significant tibial disease bilaterally, but his lower extremity  circulation is quite stable.  We will continue to follow with vascular  labs.     [name], M.D.  Electronically Signed   JDL/MEDQ  D:  03/28/2010  T:  03/28/2010  Job:  [id number]

## 2010-08-11 NOTE — Discharge Summary (Signed)
[name], [name]                ACCOUNT NO.:  [id number]   MEDICAL RECORD NO.:  [id number]          PATIENT TYPE:  INP   LOCATION:  4733                         FACILITY:  [facility]   PHYSICIAN:  Casimiro Needle B. Sherene Sires, MD, FCCPDATE OF BIRTH:  1935/03/23   DATE OF ADMISSION:  02/17/2006  DATE OF DISCHARGE:  02/27/2006                               DISCHARGE SUMMARY   DISCHARGE DIAGNOSES:  1. Asthmatic bronchitis flare of chronic obstructive pulmonary      disease.  2. Hypertension.  3. Probable Alzheimer's disease.   LABORATORY DATA:  February 20, 2006: Sodium 136, potassium 4.7, chloride  103, CO2 26, glucose 126, BUN 23, creatinine 1.0, calcium 9.  February 18, 2006: IgE level 1733.3.  February 18, 2006: BNP 105.  February 18, 2006: CK 259, CK-MB 23.8, relative index 9.2, troponin I 0.09.   RADIOLOGY:  February 22, 2006, CT scan of sinuses: Maxillary and  ethmoidal sinusitis with mucosal thickening in the left frontal and  sphenoid sinus as well.   February 21, 2006, CT of chest: Stable chronic peribronchial thickening  with endobronchial matter in bilateral lower lobes and lingula, greatest  in the left lower lobe, probably representing mucus.  Stable, mildly  enlarged left hilar lymph nodes as well as postinflammatory or  infectious changes and atelectasis or scarring in the right lung base.   BRIEF HISTORY:  [name] is a very pleasant 76 year old man with a  history of former tobacco abuse, chronic obstructive pulmonary disease,  as well as hypertension, colon cancer status post resection in 1997,  peripheral vascular disease status post lower extremity bypass, and very  functional Alzheimer's dementia.  He presented on February 17, 2006, and  was initially evaluated by Dr. Levy Pupa with the Chief Complaint  reporting 2 to 3-day history of increased cough and upper respiratory  infection symptoms.  He had not noted any purulent sputum.  Over the 24  hours prior to  admission, he noted significant shortness of breath to  the point where he could not exert himself to any extent.  He had been  using his albuterol MDI all of the time.  He presented to [facility] in respiratory distress.  He was placed on noninvasive positive-  pressure ventilation.  Arterial blood gas demonstrated mild respiratory  acidosis.  He received nebulized albuterol, Atrovent, and IV Solu-Medrol  in the emergency room in addition to supplemental oxygen.  Chest x-ray  on initial evaluation demonstrated no evidence of focal infiltrate.  He  was admitted to pulmonary critical care service for further evaluation  and therapy.   HOSPITAL COURSE BY DISCHARGE DIAGNOSES:  #1.  ACUTE BRONCHITIS FLARE OF CHRONIC OBSTRUCTIVE PULMONARY DISEASE  WITH SIGNIFICANT ASTHMATIC COMPONENT: [name] was admitted to the  intensive care unit after being evaluated in the emergency room by Dr.  Levy Pupa.  Therapy consisted of noninvasive positive-pressure  ventilation, IV systemic steroids, empiric antibiotics, aggressive  nebulizer regimen, supplemental oxygen, and close observation.  An IgE  level was obtained as reported in the above lab findings.  It was  felt  that Mr. Mayorquin has a significant asthmatic component to his asthmatic  bronchitis flare.  He completed 6 days of empiric antibiotic therapy.  His cough is markedly improved.  He continues to have inspiratory and  expiratory wheeze but feels he is much better and ready for discharge to  home.  He is currently on room air with adequate oxygenation.  He will  be discharged to home on his maintenance respiratory regimen which  consists of twice daily Advair, daily Spiriva, and p.r.n. albuterol.  In  addition to this regimen, Singulair and aggressive proton pump inhibitor  has been added to assist with the asthmatic component of his chronic  obstructive pulmonary disease.  In addition, he will also complete a  slow prednisone  taper.  He will follow up with Dr. Sherene Sires on March 06, 2006.  He was instructed to call the office should he observe increased  shortness of breath, increased cough with discolored sputum, new fever,  or chest discomfort.  Additionally, he was reminded that, should these  symptoms be significant, that he should report to the nearest emergency  room.   #2.  HYPERTENSION:  [name] had been on Avalide prior to admission.  He was trialed on Avapro during the hospitalization.  Several  reevaluations of his medical regimen were carried out by Dr. Sherene Sires during  this hospitalization.  At time of discharge, he will go home on daily  hydrochlorothiazide and daily Pletal.  Adjustments on his medical  reconciliation sheet were made.   #3.  HISTORY OF PROBABLE ALZHEIMER'S DEMENTIA:  He was maintained on  Aricept throughout the hospitalization.  No evidence of acute delirium  was noted during this hospitalization. Although [name] appeared to  have full understanding of discharge instructions, his discharge  instructions were also reviewed with his wife by the nursing staff.   DISCHARGE INSTRUCTIONS:   DIET:  Without restriction.   ACTIVITY:  Increase as tolerated.   SPECIAL INSTRUCTIONS:  Use flutter valve 4 times a day.   DISCHARGE MEDICATIONS:  1. Aricept 10 mg daily.  2. Dicyclomine 20 mg p.o. 3 times a day.  3. Hydrochlorothiazide 12.5 mg once daily.  4. Mucinex 600 mg tablets 2 tablets twice a day.  5. Pletal 100 mg p.o. daily.  6. Protonix 40 mg 1 p.o. twice daily before meals.  7. Reglan 10 mg tablets 1 p.o. before meals or food and nightly.  8. Singulair 10 mg 1 p.o. daily.  9. Advair 500/50 one puff b.i.d.  10.Spiriva 18 mcg capsule, 1 capsule inhaled daily.  11.Potassium chloride 10 mEq 1 p.o. daily.  12.Prednisone tape, 10 mg tablets, instructed to take 6 tablets daily      x3 days, the 4 tablets daily x3 days, then 2 tablets daily x3 days,     the 1 tablet daily x3  days, the discontinue use.  13.Albuterol 90 mcg MDI p.r.n.  He was instructed to take 2 puffs as      needed for increased shortness of breath every 4 hours.   FOLLOW UP:  Appointment with Dr. Sandrea Hughs on March 06, 2006.   PHYSICAL EXAMINATION ON DISCHARGE:  VITAL SIGNS: Temperature 97.8, pulse  rate 71, respirations 18-20 breaths per minute, blood pressure 138/75,  room air saturation 91-97%.  GENERAL:  Mr. Pry is in no distress and eager to go home.  HEENT:  Normocephalic.  Sclerae nonicteric.  NECK:  No JVD or adenopathy.  PULMONARY:  He does have  scattered inspiratory and expiratory wheeze  throughout all lung fields; however, he does have good air movement.  I  hear no rales on exam.  CARDIAC: Regular rate and rhythm.  EXTREMITIES:  Without edema, 2+ pulses, with brisk capillary refill.  ABDOMEN: Soft and nontender with no organomegaly.  GU: Voids without assistance.  NEUROLOGIC: Grossly intact.   DISPOSITION:  Currently stable for discharge to home with followup in  the outpatient setting as described above.   Over 30 minutes of time was dedicated to discharge planning and patient  counseling.      Zenia Resides, NP      Charlaine Dalton. Sherene Sires, MD, [facility]  Electronically Signed    PB/MEDQ  D:  02/27/2006  T:  02/27/2006  Job:  [id number]

## 2010-08-11 NOTE — Assessment & Plan Note (Signed)
[facility]                               PULMONARY OFFICE NOTE   [name], [name]                         MRN:          [id number]  DATE:01/28/2006                            DOB:          09-16-1934    REFERRING PHYSICIAN:  Fleet Contras, M.D.   REASON FOR CONSULTATION:  Cough and dyspnea.   HISTORY:  A 76 year old black male who quit smoking in 1995 with onset of  stiffness when he walks along with dyspnea in August of this year.  He  states it has gotten so bad that he no longer gets out and walks anywhere  with his wife including at grocery stores I let her do all the shopping.  He actually drove himself to the office today without his wife but only  walked about 50 feet before his legs gave out.  He denies any variability  with environmental or weather changes.  Denies any subjective wheeze,  associated orthopnea, cough, fevers, chills, sweats, chest pain or leg  swelling.   PAST MEDICAL HISTORY:  1. Significant for colon cancer, status post resection.  2. COPD.  3. Hypertension.  4. Peripheral vascular disease.   ALLERGIES:  None known.   MEDICATIONS:  Taken on detail, on the worksheet dated January 28, 2006 and  include lisinopril and Zocor.   SOCIAL HISTORY:  He quit smoking in 1995.  He is retired.  No unusual  travel, pet, or hobby exposure.   FAMILY HISTORY:  Negative for respiratory diseases or atypia.   REVIEW OF SYSTEMS:  Taken in detail and on the worksheet.  Significant for  the problems as outlined above.  His main complaint of fatigue and leg  stiffening up while walking is a perfectly symmetric sensation that he has  in both thighs and calves.   PHYSICAL EXAMINATION:  This is a somewhat depressed appearing ambulatory  black male in no acute distress.  Stable vital signs.  HEENT is significant  for dentures.  Oropharynx, however, is clear with no evidence of excessive  postnasal drip.  Nasal turbinates normal.  Ear  canals clear bilaterally.  Neck was supple without cervical adenopathy or tenderness.  Trachea is  midline.  Lung fields reveal slight hyperresonant percussion with diminished  breath sounds but no true wheeze.  He did have prominent pseudowheeze.  There was a regular rhythm without murmur, rub, or gallop present.  Abdomen  was soft and benign.  Extremities were warm without calf tenderness,  cyanosis, clubbing or edema.   IMPRESSION:  1. Poor exercise tolerance:  I believe is multifactorial and can only be      partly blamed on chronic obstructive pulmonary disease.  The most      prominent aspect of his history was the leg stiffness and aching that      occurs with minimal ambulation in the absence of classic claudication      which does not usually cause such symmetric complaints.  This may well      be a reaction to Zocor.  I am going to ask him to  stop the Zocor for up      to four weeks and then review his progress.  2. The pseudowheezing he has on exam is classic for upper airway      dysfunction and is probably related to ACE inhibitors but may be      secondary to occult reflux as well.  I have asked him to stop the      lisinopril, replace it with Avalide 150/12.5 one daily which I would      like him to take in the place of lisinopril plus hydrochlorothiazide.      We will recheck his blood pressure in two weeks and then have him      return for pulmonary function tests to determine whether or not chronic      obstructive pulmonary disease is truly a limiting factor in terms of      his ambulation, which seems unlikely at this point since it did not      limit him when he stopped smoking in 1995.    ______________________________  [name]. Sherene Sires, MD, [facility]    MBW/MedQ  DD: 01/28/2006  DT: 01/29/2006  Job #: [id number]   cc:   Fleet Contras, M.D.

## 2010-08-11 NOTE — H&P (Signed)
[facility]  Patient:    [name], BARKAN Visit Number: [id number] MRN: [id number]          Service Type: DSU Location: [facility] 2899 22 Attending Physician:  Colvin Caroli Dictated by:   [name], M.D. Admit Date:  03/13/2001 Discharge Date: 03/13/2001   CC:         Catheterization Laboratory   History and Physical  PREOPERATIVE DIAGNOSIS:  Severe bilateral lower extremity claudication secondary to aortoiliac and bilateral femoropopliteal occlusive disease.  OPERATION:  Abdominal aortogram with bilateral lower extremity runoff via right common femoral approach.  SURGEON:  Quita Skye. Hart [place], M.D.  ANESTHESIA:  Local with Xylocaine and Versed 2 mg intravenously.  DESCRIPTION OF PROCEDURE:  The patient was taken to [facility] Peripheral Endovascular Laboratory, placed in the supine position at which time both groins were prepped with Betadine solution and draped in routine sterile manner.  After infiltration with 1% Xylocaine, the right common femoral artery was entered percutaneously, guide wire passed into the suprarenal aorta under fluoroscopic guidance.  A 5 French sheath and dilator were cast over the guide wire and the dilator removed and a standard pigtail catheter positioned in the suprarenal aorta.  Flush abdominal aortogram was performed injecting 20 cc of contrast at 20 cc/sec.  This revealed the aorta to be widely patent although slightly small in caliber, with single widely patent renal arteries bilaterally.  The inferior mesenteric artery was patent.  There was severe left iliac occlusive disease involving primarily the left external iliac artery, with the right common, internal, and external iliac arteries all appearing widely patent with mild disease.  The catheter was withdrawn into the terminal aorta and bilateral iliac angiography performed.  This revealed the left common iliac artery to be mildly narrowed with a  severe stenosis at the origin of the external iliac over a distance of about 3 cm but approximately 80% stenotic with a second lesion approximating 95% over 2 cm distal to this point with irregularity down to the inguinal ligament.  There was also disease in the left common femoral artery causing a moderate stenosis.  Bilateral lower extremity runoff was then performed using the step technique with 88 cc of contrast injected at 8 cc/sec.  This revealed the right common femoral artery to be mildly narrowed with patency of the right profunda femoris with total occlusion of the right superficial femoral artery at its origin with reconstitution about 10 cm above the knee joint.  On the right there was significant tibial disease, worse in the anterior tibial artery, the best vessel appearing to be the posterior tibial artery in the peroneal.  On the left side, the common femoral artery had a moderate stenosis with a patent left profunda.  Left superficial femoral artery was totally occluded with reconstitution above the knee approximately 10 cm above the knee joint with significant tibial disease on the left involving mostly the anterior tibial artery with the best vessel appearing to be the posterior tibial and peroneal arteries.  Having tolerated the procedure well, the pigtail catheter was removed over the guide wire, sheath removed after compression applied.  No complications ensued.  FINDINGS: 1. Severe left external iliac occlusive disease with two areas of 80-99%    stenosis. 2. Bilateral superficial femoral occlusions. 3. Moderate left common femoral artery stenosis. 4. Significant tibial disease bilaterally with best vessels being the    posterior tibial and peroneal in both legs. Dictated by:   [name],  M.D. Attending Physician:  Colvin Caroli DD:  03/13/01 TD:  03/13/01 Job: [id number]

## 2010-08-11 NOTE — H&P (Signed)
NAMETERRIS, GERMANO                ACCOUNT NO.:  [id number]   MEDICAL RECORD NO.:  [id number]          PATIENT TYPE:  INP   LOCATION:  1830                         FACILITY:  [facility]   PHYSICIAN:  Leslye Peer, MD    DATE OF BIRTH:  1935/02/24   DATE OF ADMISSION:  02/17/2006  DATE OF DISCHARGE:                                HISTORY & PHYSICAL   CHIEF COMPLAINT:  Respiratory distress and wheezing.   HISTORY:  Mr. Westrup is a 76 year old man with a history of former tobacco  use and COPD, hypertension, colon cancer status post resection and 1997, and  peripheral vascular disease status post lower extremity bypass.  He tells me  that he was feeling well with normal exercise tolerance and no significant  wheezing until about 2 or 3 days ago when he developed cough and upper  respiratory infection symptoms.  He has not had any purulent sputum.  Over  the last 24 hours, he has been very short of breath and has been unable to  exert himself.  He has been using albuterol all the time.  He presented to  [facility] in respiratory distress.  He was placed on BiPAP, and a  subsequent ABG showed a mild respiratory acidosis.  He has received  nebulized albuterol, Atrovent, intravenous Solu-Medrol and supplemental  oxygen.  Chest x-ray in the emergency department shows no evidence of focal  infiltrate.  He will be admitted to critical care service for further care.   PAST MEDICAL HISTORY:  Is as above.  Also, please note the patient has  probable Alzheimer's dementia.  He also has history of a left lower lobe  collapse status post mucus plugging and fiberoptic bronchoscopy.  Finally,  he has been treated for irritable bowel syndrome.   ALLERGIES:  No known drug allergies.   CURRENT MEDICATIONS:  1. Aricept 10 mg daily.  2. Avalide 150/12.5 mg daily.  3. Advair 500/50 b.i.d.  4. Dicyclomine 20 mg 3 times a day.  5. Potassium chloride 10 mEq daily.  6. Pletal 100 orders daily.  7. Spiriva 1 inhalation daily.  8. Albuterol q.4 h p.r.n. for shortness of breath.   SOCIAL HISTORY:  He is a former tobacco user; the amount is not clear, but  he quit 15 years ago.  He is retired and used to sell life insurance.   FAMILY HISTORY:  Noncontributory.   PHYSICAL EXAMINATION:  VITAL SIGNS: Afebrile, blood pressure 140/82, heart  rate 128, respiratory rate 28-35, SPO2 99% on BiPAP at 14/7 with 40% FIO2.  GENERAL: This is a thin, elderly man who is retracting and using his  accessory muscles for breathing.  He looks moderately uncomfortable.  HEENT:  BiPAP is in place.  Pupils equal, round, and reactive to light and  accommodation.  LUNGS:  Have diffuse bilateral expiratory wheezes in all lung fields.  HEART: Tachycardiac, regular without murmur.  ABDOMEN: Soft, nontender, nondistended.  Positive bowel sounds.  EXTREMITIES:  No cyanosis, clubbing, or edema.  SKIN: Dry.  Neurologic: He is awake, alert, a bit anxious but has a  nonfocal exam.   LABORATORY EVALUATION:  White blood cell count is 9.0 with a normal  differential.  Hematocrit is 50.5 which is elevated.  His platelets are 272.  Sodium 136, potassium 4.6, chloride 108, CO2 23.5, BUN 13, creatinine 1.2,  glucose 154.  ABG on 40% FIO2 and a BiPAP was 7.30, 52, 169, 24. INR 1.0,  PTT 28. D-dimer 1.63.   Chest x-ray showed changes consistent with COPD with basilar lucency. There  are no discrete infiltrates.   IMPRESSION:  1. Hypoxemic and hypercapnic respiratory failure.  We will continue him on      noninvasive positive pressure for now, and I will consider changing      this to p.r.n. if he stabilizes.  Will follow his clinical symptoms and      his ABG. I did discuss mechanical ventilation with the family and with      the patient. At this time, he desires to be full code.  2. Chronic obstructive pulmonary disease exacerbation in the setting of      apparent upper respiratory infection.  We will continue  Solu-Medrol 60      mg q.6 h.  He will be given azithromycin, and I suspect he will be able      to tolerate p.o. medications.  He will continue to get bronchodilators      via nebulizer, and I will convert him back to Spiriva and Advair when      he stabilizes.  3. Hypertension.  I will continue his home dose of Avalide.  4. Peripheral vascular disease.  Continue his Pletal.  5. Irritable bowel syndrome.  Continue his Bentyl.  6. Dementia.  Continue his Aricept.  7. Prophylaxis.  He will be started on proton pump inhibitor and      enoxaparin for DVT prophylaxis.      Leslye Peer, MD  Electronically Signed     RSB/MEDQ  D:  02/17/2006  T:  02/17/2006  Job:  [id number]   cc:   Charlaine Dalton. Sherene Sires, MD, FCCP

## 2010-08-11 NOTE — Op Note (Signed)
[place]. [facility]  Patient:    [name], [name] Visit Number: [id number] MRN: [id number]          Service Type: Attending:  Quita Skye. Hart Rochester, M.D. Dictated by:   [name], M.D. Proc. Date: 04/02/01                             Operative Report  PREOPERATIVE DIAGNOSIS:  Severe claudication left leg secondary to left iliac occlusive disease and left superficial occlusion.  POSTOPERATIVE DIAGNOSIS:  Severe claudication left leg secondary to left iliac occlusive disease and left superficial occlusion.  OPERATION: 1. Right femoral to left femoral bypass using an 8 mm Hemashield Dacron graft. 2. Left common femoral to popliteal (above-knee) bypass using 6 mm stretch    Gore-Tex graft with intraoperative arteriogram.  SURGEON:  Quita Skye. Hart Rochester, M.D.  FIRST ASSISTANT:  Caralee Ates, M.D.  SECOND ASSISTANT:  Loura Pardon, P.A.  ANESTHESIA:  General endotracheal.  PROCEDURE:  The patient was taken to the operating room, placed in the supine position, at which time satisfactory general endotracheal anesthesia was administered.  Both groins and the left lower extremity were prepped with Betadine scrub and solution, draped in a routine sterile manner.  A longitudinal incision was made on the right side and the common, superficial, and profunda femoris arteries dissected free.  There was a posterior plaque in the common femoral artery extending up beneath the inguinal ligament but the anterior portion of the vessel was soft, had an excellent pulse.  The right superficial femoral artery was known to be totally occluded.  Similar incision was made on the left side and the femoral vessels were dissected free, and there was a very weak - less than 1+ - pulse in the left common femoral artery with similar disease as on the right side but more severe external iliac disease.  Saphenous vein was then examined.  It was patent with a moderate-sized anterior branch  but was not excellent, appearing to be only about 2.5 to 3 mm in size.  It was decided to use a Gore-Tex graft to the above-knee popliteal artery.  The popliteal artery was then exposed through a medial incision, being careful not to injure the saphenous vein.  The popliteal artery was mildly diseased but widely patent on the angiogram.  A subfascial anatomic tunnel was created and a suprapubic tunnel created, and the patient was heparinized.  An 8 mm Hemashield Dacron graft was delivered through the suprapubic tunnel on the right side, the femoral vessels occluded with vascular clamps, a longitudinal opening made with the 15 blade, extended with the Pott scissors.  The Dacron was slightly spatulated and anastomosed end-to-side using continuous 5-0 Prolene.  A similar procedure was then performed on the left side where the arteriotomy extended down to the origin of the profunda and there was very poor inflow through the native system.  The graft was anastomosed end-to-side to the common femoral artery with 5-0 Prolene.  A site for anastomosis of the Gore-Tex to the distal end of the Dacron was selected and the graft was opened with an 11 blade, a portion of the graft excised with the Metzenbaum scissors, and the 6 mm Gore-Tex graft which had been tunneled from the left inguinal region to the above-knee popliteal artery was spatulated and anastomosed end-to-side using continuous 6-0 Prolene.  Following this it was anastomosed end-to-side with the popliteal artery above the knee, which  was a 4 to 4.5 mm vessel.  When this was completed the clamps were released and there was an excellent pulse in all branches.  There was good Doppler flow in the posterior tibial artery at the ankle with the graft open.  Intraoperative arteriogram revealed a widely patent anastomosis with two-vessel runoff through the posterior tibial and peroneal arteries - both of which appeared diseased.  The anterior  tibial artery was occluded in the proximal third of the leg.  Protamine was given to reverse the heparin.  Following adequate hemostasis, the wounds were irrigated with saline, closed in layers with Vicryl and clips, sterile dressing applied. Patient taken to the recovery room in satisfactory condition. Dictated by:   [name], M.D. Attending:  Quita Skye. Hart Rochester, M.D. DD:  04/02/01 TD:  04/02/01 Job: [id number]

## 2010-08-11 NOTE — H&P (Signed)
NAMEKRAMER, HANRAHAN                ACCOUNT NO.:  [id number]   MEDICAL RECORD NO.:  [id number]          PATIENT TYPE:  EMS   LOCATION:  ED                           FACILITY:  [facility]   PHYSICIAN:  Kela Millin, M.D.DATE OF BIRTH:  July 16, 1934   DATE OF ADMISSION:  03/14/2005  DATE OF DISCHARGE:                                HISTORY & PHYSICAL   PRIMARY CARE PHYSICIAN:  Duncan Dull, M.D.   CHIEF COMPLAINT:  Chest pain.   HISTORY OF PRESENT ILLNESS:  The patient is a 76 year old black male with  past medical history significant for COPD, history of colon cancer - status  post surgical resection in 1996, hypertension, hypercholesterolemia, and  history of peripheral vascular disease - status post bypass graft surgery,  who presents with complaints of chest pain x1 day.  He states that he  started having the chest pain while he was driving, it lasted for less than  5 minutes, described as left precordial in location, squeezing in character,  and 8 out of 10 in intensity.  No radiation, no associated shortness of  breath, diaphoresis, and no nausea and vomiting.  He states that the pain  recurred and so he came to the ER for further evaluation.  He states that he  has had URI symptoms x2 weeks - cough sometimes productive of whitish  phlegm.  He denies fever, denies hemoptysis.  The patient reports that  earlier in the day, he had some shortness of breath which resolved after he  used his inhalers.  He denies orthopnea, also denies PND.   The patient was seen in the ER and his point of care enzymes were negative.  He had a chest x-ray done which showed air space opacity in the medial left  lower lobe and a CT angiogram was done and it showed new left lower lobe  collapse with no obvious mass, negative pulmonary embolus and also negative  for aortic dissection.  The patient is admitted to the [facility] for further evaluation and management.   PAST MEDICAL  HISTORY:  As stated above.   MEDICATIONS:  1.  Lipitor 20 mg daily.  2.  Norvasc 10 mg daily.  3.  Advair.  4.  Albuterol.  5.  Pletal.   ALLERGIES:  No known drug allergies.   SOCIAL HISTORY:  He quit tobacco 20 years ago, occasional alcohol.   FAMILY HISTORY:  His mother and father are deceased and he did not know  their medical conditions.   REVIEW OF SYSTEMS:  As per HPI, otherwise negative.   PHYSICAL EXAMINATION:  GENERAL:  The patient is an elderly black male, alert  and oriented in no acute distress.  VITAL SIGNS:  Temperature 98.1, blood pressure 136/72, pulse 84, and his O2  saturation is 97%.  HEENT:  PERRL, EOMI, moist mucous membranes, sclerae anicteric.  NECK:  Supple, no adenopathy and no thyromegaly.  LUNGS:  Scattered wheezes bilaterally, bronchial breath sounds in the left  lower lung field and also decreased breath sounds.  CARDIOVASCULAR:  Regular rate and rhythm.  Normal S1 and S2.  ABDOMEN:  Soft, bowel sounds present.  Nontender and nondistended, no masses  palpable and no organomegaly.  NEUROLOGY:  Alert and oriented x3.  Cranial nerves II-XII grossly intact.  Nonfocal examination.   LABORATORY DATA:  Chest x-ray; air space opacity in the medial left lower  lobe compatible with atelectasis or infection.  Also COPD/emphysema changes  noted.   CT angiogram; new left lower lobe collapse.  No obvious mass (central tumor  must be excluded per radiology).  Negative for pulmonary embolus, negative  for aortic dissection.   Sodium 132, potassium 3.5, chloride 99, CO2 25, glucose 119, BUN 14,  creatinine 1.2.  Point of care markers are negative.   ASSESSMENT:  Problem 1.  Chest pain/cardiac versus pulmonary.  As noted  above, the patient's chest x-ray with left lower lobe opacity and the CT  angiogram with collapse of the left lower lobe.  We will start the patient  on empiric antibiotics, follow and consult pulmonology for further  recommendations.  Also  noted above is the patient's risk factors for MI,  hypertension, history of hypercholesterolemia.  I will obtain serial cardiac  enzymes and EKG.  Start nitrates, aspirin, follow, and consider cardiology  consultation pending cardiac enzymes.   Problem 2.  Left lower lobe opacity/collapse - as discussed above.   Problem 3.  Chronic obstructive pulmonary disease.  Nebulized  bronchodilators, antibiotics, continue Advair.   Problem 4.  Hypertension.  We will continue Norvasc.   Problem 5.  History of hyperlipidemia.  Continue Lipitor.           ______________________________  Kela Millin, M.D.     ACV/MEDQ  D:  03/15/2005  T:  03/15/2005  Job:  [id number]   cc:   Duncan Dull, M.D.  Fax: [phone]

## 2010-08-11 NOTE — Discharge Summary (Signed)
[place]. [facility]  Patient:    [name], COUFAL Visit Number: [id number] MRN: [id number]          Service Type: SUR Location: 2000 2017 01 Attending Physician:  Colvin Caroli Dictated by:   Adair Patter, P.A. Admit Date:  04/02/2001 Discharge Date: 04/05/2001                             Discharge Summary  DATE OF BIRTH:  December 03, 1934.  ADMISSION DIAGNOSIS:  Bilateral lower extremity claudication.  DISCHARGE DIAGNOSES: 1. Left iliac artery stenosis. 2. Bilateral superficial femoral artery occlusions.  HOSPITAL PROCEDURES: 1. Right to left femoral artery to femoral artery bypass graft. 2. Left femoral popliteal bypass graft.  HOSPITAL COURSE:  The patient was admitted to [facility] on 04/02/01 secondary to bilateral lower extremity claudication.  Dr. Hart Rochester had previously seen this patient as an outpatient and recommended surgical correction of his problem.  On the day of admission the patient underwent a right to left femoral to femoral bypass graft and left femoral to above knee popliteal bypass graft.  There were no complications noted during the procedure.  Postoperatively the patient had an uneventful hospital course. His vascular grafts remained patent throughout his entire postoperative course. His hemoglobin and hematocrit remained stable at 10.6 and 30.5.  His BUN and creatinine were stable at 8 and 1.0 respectively.  The patient ambulated well and was subsequently discharged home in stable condition on postoperative day #3.  MEDICATIONS AT DISCHARGE: 1. Percocet one to two tablets q.4-6h p.r.n. pain. 2. ______ 100 mg daily. 3. Norvasc 10 mg p.o. q.d. 4. Hydrochlorothiazide 25 mg daily. 5. Lipitor 20 mg daily. 6. Lisinopril 10 mg daily. 7. Aspirin 81 mg daily. 8. Advair inhaler one puff as needed.  ACTIVITY:  Patient is told no driving, strenuous activity or heavy lifting of any objects. He is told to walk daily.  DIET:   Low fat, low salt.  WOUND CARE:  The patient was told to go to the CVTS office on April 15, 2001 at 10:00 a.m. for staple removal. He is also to shower and clean his incision with soap and water.  DISPOSITION:  Home.  FOLLOW UP:  Patient is told to see Dr. Hart Rochester at the CVTS office on April 29, 2001 at 9:00 a.m. Dictated by:   Adair Patter, P.A. Attending Physician:  Colvin Caroli DD:  04/28/01 TD:  04/28/01 Job: [id number]

## 2010-08-11 NOTE — H&P (Signed)
NAMEJERMY, COUPER                ACCOUNT NO.:  [id number]   MEDICAL RECORD NO.:  [id number]          PATIENT TYPE:  INP   LOCATION:  3733                         FACILITY:  [facility]   PHYSICIAN:  Charlton Haws, M.D.     DATE OF BIRTH:  February 15, 1935   DATE OF PROCEDURE:  03/16/2005  DATE OF DISCHARGE:                      STAT - MUST CHANGE TO CORRECT WORK TYPE   REASON FOR CONSULTATION:  Chest pain.   HISTORY OF PRESENT ILLNESS:  Mr. Mcginness is a 76 year old patient of Dr.  Kevan Ny. He was admitted to the hospital with chest pain. Chest pain was not  typical of angina.   The patient has significant dementia which makes taking a history somewhat  difficult.   However he was admitted on March 14, 2005 with chest pain while he was  driving. It lasted less than 5 minutes, it was left precordium in location.  It was 8 out of 10 in intensity with no radiation. He has some chronic  shortness of breath.   He has had URI type symptoms for 2 weeks and a cough productive of whitish  sputum. He denies any hemoptysis.   The patient has ruled out for myocardial infarction. He had no acute EKG  changes. He was found to have left lower lobe collapse on x-ray. I actually  saw the patient while he was in recovery from having a bronchoscopy, Dr.  Shelle Iron found a large left lower lobe mucus plug which he aspirated.   The patient's coronary risk factors include peripheral vascular disease. He  has been seen extensively by Dr. Hart Rochester. He has had a fem-fem bypass and a  left fem-pop bypass. I do not know that he has had any cardiac work up  before these procedures.   There are no records of a Myoview study.   He also has a history of colon cancer with resection, underlying COPD,  hypertension and peripheral vascular disease as noted.   ALLERGIES:  No known drug allergies.   MEDICATIONS:  1.  Amlodipine 10 mg daily.  2.  Aspirin daily.  3.  Albuterol.  4.  He is on Lovenox in the hospital SQ.  5.  Advair b.i.d.  6.  Guaifenesin.  7.  Atrovent.  8.  Avelox.  9.  Simvastatin 40 daily.   REVIEW OF SYSTEMS:  Otherwise unremarkable.   ALLERGIES:  No known drug allergies.   PHYSICAL EXAMINATION:  GENERAL:  He is post-bronchoscopy and somewhat  sedated.  VITAL SIGNS:  Blood pressure is 130/70, pulse is 70 and regular.  CHEST:  He has some expiratory wheezes and decreased breath sounds in the  left.  CARDIOVASCULAR:  JVP is normal. There is an S1, S2 with normal heart sounds.  He has a midline scar in the abdomen. He has bruits in both femoral arteries  and across the fem-fem graft below the umbilicus. He is status post left fem-  pop. He does have palpable PTs bilaterally.   The patient's EKG shows sinus rhythm with no acute changes.   His point of care enzymes are negative. His creatinine is 0.9. White count  is 6500, hematocrit is 39.7.   He is currently saturating 98% on 2 liters.   IMPRESSION:  The patient's pain is not typical of angina. Certainly he could  have coronary disease given his peripheral vascular disease.   However I suspect that the entire hospitalization was secondary to his large  mucus plug and left lower lobe collapse.   From a cardiac stand-point I do not think any further inpatient work up is  needed. I would have the medical doctors and pulmonary doctors follow him up  in regards to continued antibiotics and to make sure he does not developed a  post obstructive pneumonia from his Atelectasis.   The patient can be seen as an outpatient to have a Myoview and a 2D  echocardiogram after his hospital discharge.   I would not necessarily start the patient on beta blockers given his  reactive airway disease and severe COPD. His blood pressure seems to be  under reasonable control.  He is already on Simvastatin and a baby aspirin.   The patient will be followed by the [facility] and pulmonary and any  cardiac work up can be done as an  outpatient.           ______________________________  Charlton Haws, M.D.     PN/MEDQ  D:  03/16/2005  T:  03/16/2005  Job:  [id number]   cc:   Candyce Churn, M.D.  Fax: [phone]

## 2010-08-11 NOTE — Op Note (Signed)
NAMEDINO, BORNTREGER                            ACCOUNT NO.:  [id number]   MEDICAL RECORD NO.:  [id number]                   PATIENT TYPE:  AMB   LOCATION:  ENDO                                 FACILITY:  [facility]   PHYSICIAN:  James L. Malon Kindle., M.D.          DATE OF BIRTH:  [birth year]   DATE OF PROCEDURE:  01/19/2003  DATE OF DISCHARGE:                                 OPERATIVE REPORT   PROCEDURE:  Colonoscopy and biopsy.   MEDICATIONS:  Fentanyl 50 mcg and Versed 7.5 mg IV.   INDICATIONS:  Previous history of colon cancer.   SCOPE:  Adult colonoscope.   DESCRIPTION OF PROCEDURE:  The procedure had been explained to the patient  and consent obtained.  He has moved here from [place].  He had colon  cancer resected in [place].  The location of the cancer was unknown.  He  is referred over for colonoscopy.  The last one was five years ago down in  [facility].  A digital exam was performed, and a narrowed area was felt at  the tip of the finger. I inserted the scope, and there was a strictured  area.  We were able to pass it.  It was somewhat friable. It did not appear  to be ulcerated.  We were able to advance easily to the cecum using  abdominal pressure and position changes.  The ileocecal valve and  appendiceal orifice were seen.  The scope was withdrawn to the cecum.  The  ascending colon and transverse colon were seen well.  In the descending  colon was what appeared to be a 2-3 cm lipoma.  The submucosa was covered  with apparently normal mucosa.  Biopsy forceps were pushed in, there was a  positive pillow sign.  Biopsies were taken.  The remainder of the descending  and sigmoid colon were unremarkable.  15 cm from the anal verge was a  strictured area.  Just the passage of the scope did cause friability.  There  was bleeding.  There was a slight ulceration.  Several biopsies were taken.  It had the appearance more of a benign stricture than obvious cancer, but  biopsies were taken any way.  The remainder of the rectum was free of  polyps.  The scope was withdrawn.  The patient tolerated the procedure well.   ASSESSMENT:  1. Rectal stricture, likely benign, based on the endoscopic appearance, but     we will need to rule out recurrent cancer.  2. Descending colon lipoma.    PLAN:  Will check the pathology.  Will start him empirically on MiraLax.  Will see back in the office in three to four weeks.  Will likely need a  repeat colon in five years.  James L. Malon Kindle., M.D.    Waldron Session  D:  01/19/2003  T:  01/19/2003  Job:  [id number]   cc:   Duncan Dull, M.D.  [address]  [place]  [place] [address]  Fax: [phone]

## 2010-08-11 NOTE — H&P (Signed)
[name], [name]                ACCOUNT NO.:  [id number]   MEDICAL RECORD NO.:  [id number]          PATIENT TYPE:  INP   LOCATION:  0356                         FACILITY:  [facility]   PHYSICIAN:  Melissa L. Ladona Ridgel, MD  DATE OF BIRTH:  11/21/1934   DATE OF ADMISSION:  05/14/2004  DATE OF DISCHARGE:  05/16/2004                                HISTORY & PHYSICAL   A full history and physical is already present on the chart.   CHIEF COMPLAINT:  Shortness of breath.   PRIMARY CARE PHYSICIAN:  Duncan Dull, M.D.   HISTORY OF PRESENT ILLNESS:  The patient is a 76 year old African-American  male who began with shortness of breath Thursday both at rest and with  exertion. He has had no sick contacts, he denies any chest pain but  complains of congestion. He denied cough without sputum. The patient has  very poor recall for his medical history but otherwise does not appear  disoriented.  The patient apparently just returned from a cruise and states  that his shortness of breath was present prior to flying to the cruise but  again he is unsure when it really started.   REVIEW OF SYMPTOMS:  No fever, chills, nausea, vomiting, diarrhea. No chest  pain, all other review of systems are negative.   PAST MEDICAL HISTORY:  The patient states he has no emphysema; however,  review of the records indicates that he likely does have underlying COPD. He  denies hypertension but he is taking medication for hypertension. He denies  diabetes, does have a history of colon cancer for which he has had his  surgery and old records indicate that he has ileooclussive disease.   PAST SURGICAL HISTORY:  He had colon resection in 1997, he has had right to  left femoral femoral bypass and left femoral to popliteal bypass.   SOCIAL HISTORY:  He quit tobacco 20 years ago, he drinks socially and is a  retired Chief Financial Officer.   FAMILY HISTORY:  His mom is deceased and dad are deceased. He does not know  their  medical conditions.   ALLERGIES:  No known drug allergies.   MEDICATIONS:  He gets medications at [facility] [employer] and does not know  the appropriate doses but states he takes albuterol, had been on doxycycline  for an unknown reason, lisinopril 10 mg q.d., Lipitor 20 mg q.d.,  hydrochlorothiazide 25 mg q.d., Advair 250/50 b.i.d.   PHYSICAL EXAMINATION:  VITAL SIGNS:  Temperature is 98.5, blood pressure is  142/84, initially it was 178/96.  Pulse is 86, respirations are 20,  saturations are 94%.  GENERAL:  He is in no acute distress, he is well-developed, well-nourished,  normocephalic, atraumatic.  HEENT:  Pupils equal round and reactive to light. Extraocular muscles are  intact. Mucous membranes are moist.  NECK:  Supple, there of no JVD, no lymph nodes.  CHEST:  Shows bilateral wheezing with no rhonchi, rales or wheezes.  CARDIOVASCULAR:  Regular rate and rhythm, positive S1, S2. No S3, S4.  ABDOMEN:  Soft, nontender, nondistended with positive bowel  sounds.  EXTREMITIES:  Show no cyanosis, clubbing or edema.  NEUROLOGIC:  He is awake, alert, oriented x3 but has poor memory for his  medical history.   LABORATORY DATA:  Reveal white count of 6.4, hemoglobin of 16.3, hematocrit  of 48.3, platelets are 332, sodium is 132, potassium is 3.4, chloride is 98,  CO2 is 26, BUN is 9, creatinine 1.0.  His glucose was 142, calcium is 9.1.   His chest x-ray showed COPD without infiltrate, there is a question of a  nodule in the posterior mid shaft.   ASSESSMENT/PLAN:  This 76 year old African-American male with one week of  shortness of breath at rest with no cough or sputum production, he is not  febrile. Past medical history is for colon cancer status post resection.  There is no report of an interim occurrence. At this time; however, his  chest x-ray shows possible pulmonary nodule. The patient also had recent  plane travel and was on a cruise which puts him at risk for pulmonary   emboli.   1.  Pulmonary.  Chronic obstructive pulmonary disease exacerbation with      pulmonary nodule on x-ray.  In light of the nodule and recent air      travel, I would like to rule out pulmonary emboli and also examine this      nodule for potential malignancy.  I will order a chest CT with PE      protocol. Will start him on community acquired pneumonia medications in      light of his recent cruise to cover atypicals.  He will be treated with      nebulizers, flutter valve ,steroids and we will take a D-dimer.  2.  Cardiovascular.  He is stable. He has no complaints.  He has a history      of CAD with bypass.  Will continue his lisinopril, hydrochlorothiazide      and Lipitor.  He also likely should be on an aspirin but we will allow      his primary care physician to address this, he may already be taking      this but with poor recall it likely is not showing up on his medication      list.  3.  GU.  He has no complaints but will check a UA, C&S.  4.  Endocrine.  Increased blood sugars.  I would like to start him on      sliding scale insulin while on steroids, check a hemoglobin A1C and a      fasting lipid panel. We will continue his anticholesterol medications.      MLT/MEDQ  D:  05/17/2004  T:  05/17/2004  Job:  [id number]   cc:   Duncan Dull, M.D.  [address]  [place]  [place] [address]  Fax: [phone]

## 2010-08-11 NOTE — Discharge Summary (Signed)
NAMEZALMEN, WRIGHTSMAN                ACCOUNT NO.:  [id number]   MEDICAL RECORD NO.:  [id number]          PATIENT TYPE:  INP   LOCATION:  3733                         FACILITY:  [facility]   PHYSICIAN:  Kela Millin, M.D.DATE OF BIRTH:  [birth year]   DATE OF ADMISSION:  03/15/2005  DATE OF DISCHARGE:  03/17/2005                                 DISCHARGE SUMMARY   DISCHARGE DIAGNOSES:  1.  Mucous blood/probable pneumonia.  2.  Chronic obstructive pulmonary disease.  3.  Chest pain:  Myocardial infarction ruled out. Patient to follow up as an      outpatient for a Myoview.  4.  Hypertension.  5.  History of hyperlipidemia.  6.  History of peripheral vascular disease-status post bypass graft surgery.   PROCEDURES:  1.  Bronchoscopy-per Dr. Shelle Iron with [facility].  2.  CT angiogram-new left lower lobe prolapse. Mucous plug versus neoplasm.      No evidence of acute pulmonary embolism or thoracic dissection. No      mediastinal mass demonstrated.   HISTORY:  The patient is a 76 year old black male with past medical history  significant for COPD, colon cancer-status post surgical resection in 1996,  hypertension, hypercholesterolemia and history of peripheral vascular  disease who presented with complaints of chest pain x1 day. He also admitted  to URI symptoms x2 weeks and a cough that was sometimes productive of  whitish phlegm. The patient denied fevers, hemoptysis. He also denied  orthopnea and PND. He reported that he had some shortness of breath which  resolved after he used his inhalers that day. The patient was seen in the ER  and his point of care enzymes were negative and a chest x-ray showed air  space opacity in the medial left lower lobe and a CT angiogram was done and  the results are as stated above. The patient was admitted to the [facility] for further evaluation and management.   PHYSICAL EXAM:  Upon admission, revealed a temperature of 98.1, blood  pressure of 136/72, pulse of 84, O2 sat of 96%. The pertinent findings on  exam were on his lung exam. He has scattered wheezes bilaterally, bronchial  breath sounds in the left lower lung field and also diminished breath  sounds. The rest of his physical exam was noted to be within normal limits  and on the laboratory data the CT angiogram and chest x-ray results are as  stated above and his sodium was 132 with the potassium of 3.5, chloride of  99, CO2 of 25, glucose of 119, BUN of 14, creatinine of 1.2 and his point of  care markers were negative. He has a white cell count was 7.2 with a  hemoglobin of 13.9, hematocrit of 41.3, platelet count of 267,000.   HOSPITAL COURSE:  Problem 1:  PROBABLE PNEUMONIA/MUCOUS PLUG:  On admission,  the patient was started on empiric antibiotics and was also started on  expectorants. Pulmonology was consulted regarding the left lower lobe  collapse and [place] [facility]ology saw the patient. A bronchoscopy was done on  March 16, 2005 and it  showed large mucus plug in the left lower lobe. It  was suction and lavaged. Per Dr. Shelle Iron, there was no underlying  endobronchial abnormality and no further follow-up was required. The  patient's symptoms have improved, and he has remained afebrile with no  leukocytosis and hemodynamically stable. He will be discharged on oral  antibiotics and he is to follow up with his primary care physician.   Problem 2:  CHRONIC OBSTRUCTIVE PULMONARY DISEASE:  The patient was  maintained on his Advair and nebulized bronchodilators and antibiotics as  above. The patient's shortness of breath resolved. She will be discharged at  this time and he is to follow up with his primary care physician.   Problem 3:  CHEST PAIN:  The patient had serial cardiac enzymes done and  these were negative. His chest pain resolved and he did not have any chest  pain for the rest of his hospital stay. Cardiology was consulted in light of  his risk  factors for risk stratification. [facility] saw the patient  and he is to follow up with Dr. Eden Emms as an outpatient for a Myoview.   Problem 4:  HYPERTENSION:  The patient was maintained on his outpatient  antihypertensives while in the hospital.   Problem 5:  HISTORY OF HYPERCHOLESTEROLEMIA:  The patient was maintained on  Lipitor in the hospital.   Problem 6:  HISTORY OF PERIPHERAL VASCULAR DISEASE:  Status post bypass  graft surgery. He was maintained on Pletal in the hospital.   Problem 7:  HISTORY OF PROSTATE CANCER:  The patient is status post surgical  resection in 1996.   Problem 8:  HISTORY OF COLON CANCER:  Status post surgical resection in  1996.   DISCHARGE MEDICATIONS:  1.  Avelox 400 grams p.o. q.d. x7 more days.  2.  Guaifenesin 600 milligrams two p.o. b.i.d.  3.  Patient to continue preadmission medications.   FOLLOW-UP CARE:  1.  Dr. Shaune Pollack in 1-2 weeks.  2.  Dr. [facility] for Myoview study.   DISCHARGE CONDITION:  Improved/stable.           ______________________________  Kela Millin, M.D.     ACV/MEDQ  D:  03/17/2005  T:  03/20/2005  Job:  [id number]   cc:   Duncan Dull, M.D.  Fax: [phone]   Charlton Haws, M.D.  [address]  [address]  [place]  [place] [address]

## 2010-08-11 NOTE — Procedures (Signed)
[place]  Patient:    [name], [name]                         MRN: [id number] Proc. Date: 12/28/99 Adm. Date:  [id number] Attending:  Mingo Amber CC:         Duncan Dull, M.D.   Procedure Report  PROCEDURE:  Video colonoscopy.  ENDOSCOPIST:  Roosvelt Harps, M.D.  INDICATIONS:  [age]-year-old male with a history of colon cancer.  It should be noted that preprocedure hemoglobin was normal.  CEA level was negative at less than 0.5 and liver function tests were normal.  PREPARATION:  He is NPO since midnight having taken Phospho-Soda prep and a clear liquid diet.  The mucosa throughout is clean.  DEPTH OF INSERTION:  Cecum.  SEDATION:  He received 70 mg of 7 mg of Versed intravenously.  In addition, he was on two liters of nasal cannula O2.  DESCRIPTION OF PROCEDURE:  The Olympus video colonoscope was inserted via the rectum and advanced easily to the cecum.  Noted immediately was the anastomosis at 15 cm which appeared unremarkable.  The cecal landmarks after insertion were identified and photographed.  On withdrawal, the mucosa was carefully evaluated.  There was a 2 cm probable lipoma at the hepatic flexure that was biopsied.  It did not truly appear to be a polyp.  No other abnormalities were noted.  There were no noted complications.  The patient tolerated the procedure well.  Pulse, blood pressure and oximetry testing were stable throughout.  He will be observed in recovery for one hour and discharged if alert with a benign abdomen.  IMPRESSION:  Probable normal colonoscopy post sigmoid resection for cancer. There is an apparent lipoma at the hepatic flexure.  There is no evidence of recurrent neoplasia.  PLAN:  Repeat colonoscopy in three years is recommended. DD:  12/28/99 TD:  12/28/99 Job: [id number]

## 2010-08-11 NOTE — Assessment & Plan Note (Signed)
[facility]                             PULMONARY OFFICE NOTE   [name], [name]                         MRN:          [id number]  DATE:05/10/2006                            DOB:          [birth year]    This is a pulmonary/extended final followup office visit.   HISTORY:  A 76 year old black male who returns for followup evaluation  of dyspnea, which I thought was probably upper airway-related when I saw  him on March 06, 2006.  ACE inhibitors were eliminated and now he  returns with no significant dyspnea.  He says his legs give out long  before his breathing does.  He denies any exertional chest pain,  orthopnea, PND or leg swelling, cough.   MEDICATIONS:  For a full inventory of medications, please see the face  sheet column dated May 10, 2006, but note that the patient's wife  is the one responsible for his medicines, and he did not bring his wife  or the list that was provided for him in a medicine calendar format with  him to the office today as requested.   PHYSICAL EXAMINATION:  He is a pleasant ambulatory black male in no  acute distress.  He is afebrile, stable vital signs.  HEENT:  Unremarkable, oropharynx is clear.  LUNGS:  Lung fields reveal diminished breath sounds bilaterally but  there is no wheezing, pseudo or otherwise.  CARDIAC:  There is a regular rhythm without murmur, gallop or rub.  ABDOMEN:  Soft, benign.  EXTREMITIES:  Warm without calf tenderness, cyanosis, clubbing or edema.   IMPRESSION:  Complete elimination of all of his pulmonary symptoms on  his present regimen.  My plan was to first wean him off of the high-dose  Advair and metoclopramide to see if at any point his symptoms flared  (using the reverse of the therapeutic challenge to arrive at the lowest  number of medicines needed to maintain the same level of function he  presently enjoys).   However, because he is a bit confused with his medicines and  his wife is  in charge of them anyway, I have asked him to make an appointment to  see Dr. Shaune Pollack with his wife and the medications as well as the  list we made him, and then let one physician titrate the medications  downward to reduce confusion.  Although this patient may have underlying  chronic obstructive pulmonary disease, at this point it is not limiting  him in terms of activity and unless the symptoms arise I would continue  to taper him first off of Advair and then even consider stopping  Spiriva.  Pulmonary followup can be p.r.n.    Charlaine Dalton. Sherene Sires, MD, [facility]  Electronically Signed   MBW/MedQ  DD: 05/10/2006  DT: 05/11/2006  Job #: [id number]   cc:   Duncan Dull, M.D.

## 2010-08-11 NOTE — H&P (Signed)
[place]. [facility]  Patient:    ARSALAN, BRISBIN Visit Number: [id number] MRN: [id number]          Service Type: Attending:  Quita Skye. Hart Rochester, M.D. Dictated by:   Loura Pardon, P.A. Adm. Date:  04/02/01                           History and Physical  DATE OF BIRTH:  02-14-1935  PRIMARY CARE PHYSICIAN:  Duncan Dull, M.D.  HISTORY OF PRESENT ILLNESS:  Mr. Westwood is a 76 year old male who has experienced lifestyle-limiting claudication to both lower extremities for a number of years.  He explains his symptoms as an aching in the thighs that spreads down his legs on ambulation.  He can currently cover about one block before onset of symptoms, which resolve at rest.  Ankle-brachial indexes at the office of [facility] demonstrated 0.49% on the left, 0.64% on the right, with finding of moderate left iliac stenosis and bilateral superficial femoral artery occlusions by ultrasound. He underwent angiography at [facility] on March 13, 2001. Results of this study are unavailable at present.  PAST MEDICAL HISTORY: 1. Known infrainguinal arterial occlusive disease. 2. History of colon cancer. 3. Hypertension. 4. Hypercholesterolemia. 5. COPD/emphysema.  PAST SURGICAL HISTORY:  Status post colectomy, October 1996.  MEDICATIONS: 1. Pletal 100 mg daily. 2. Norvasc 10 mg daily. 3. Hydrochlorothiazide 25 mg daily. 4. Lipitor 20 mg daily. 5. Lisinopril 10 mg daily. 6. Aspirin 81 mg daily. 7. Advair Diskus 250/50 one puff as needed. 8. Albuterol metered dose inhaler two puffs as needed.  ALLERGIES:  No known drug allergies.  REVIEW OF SYSTEMS:  Patient denies any prior history of myocardial infarction, coronary artery disease, cerebrovascular accident, transient ischemic accident, amaurosis fugax, asthma, peptic ulcer disease, GI bleed, nephrolithiasis, diabetes mellitus, syncope, or  presyncope, or cardiac dysrhythmias.  SOCIAL HISTORY:  The patient is married, he has three children and a supportive spouse.  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  He has a remote history of tobacco habituation, having quit in 1993.  Occasionally partakes of wine.  FAMILY HISTORY:  Mother died at age 28 of a cerebrovascular accident.  Father died at age 73 of old age.  He has four brothers living who are in good health, six sisters who are elderly but also in good health.  One brother died in 31 at age 65 of a myocardial infarction.  One brother died at age 41 of pancreatic cancer.  PHYSICAL EXAMINATION:  GENERAL:  This is an alert, oriented, well-nourished, vigorous gentleman in no acute distress presently.  VITAL SIGNS:  He is afebrile, pulse 80 and regular, respirations 20 and even. Blood pressure in the right upper extremity is 126/60, left upper extremity 138/64.  HEENT:  Eyes show pupils are equal, round and reactive to light.  Extraocular movements intact.  Ears and hearing grossly normal.  Oropharynx shows that he wears both upper and lower dentures without lesions.  Nares are patent, sinuses clear.  Ophthalmoscopic exam deferred.  NECK:  No carotid bruits auscultated.  No jugular venous distention, no cervical lymphadenopathy.  LUNGS:  Clear to auscultation and percussion bilaterally.  HEART:  Regular rate and rhythm without murmur.  ABDOMEN:  Soft, nondistended.  Bowel sounds present.  No hepatosplenomegaly. The abdominal aorta is nonpulsatile and nonexpansile.  EXTREMITIES:  Show no evidence of ischemic changes in the lower extremities.  He has a 4/4 radial pulse bilaterally.  The right femoral pulse is 3/4, the left femoral pulse is hard to palpate.  The pedal pulses were not palpated. Upper and lower extremities both have normal range of motion and motor strength.  Patellar reflexes 2+ bilaterally.  NEUROLOGIC:  Grossly intact.  RECTAL:   Deferred.  IMPRESSION:  Lower extremity claudication bilaterally with occlusion of bilateral superficial femoral arteries, moderate left iliac stenosis.  PLAN:  Right to left femoral to femoral bypass, left femoral to popliteal bypass, with recruitment of the greater saphenous vein as conduit; Dr. Quita Skye. Hart Rochester, Careers adviser. Dictated by:   Loura Pardon, P.A. Attending:  Quita Skye. Hart Rochester, M.D. DD:  04/01/01 TD:  04/01/01 Job: [id number]

## 2010-08-11 NOTE — Op Note (Signed)
[name], [name]                ACCOUNT NO.:  [id number]   MEDICAL RECORD NO.:  [id number]          PATIENT TYPE:  INP   LOCATION:  3733                         FACILITY:  [facility]   PHYSICIAN:  Marcelyn Bruins, M.D. [facility] DATE OF BIRTH:  11-10-1934   DATE OF PROCEDURE:  03/16/2005  DATE OF DISCHARGE:  03/17/2005                                 OPERATIVE REPORT   PROCEDURE:  Flexible fiberoptic bronchoscopy.   INDICATION:  Left lower lobe collapse of unknown etiology in a long-time  smoker.   OPERATOR:  [facility].   ANESTHESIA:  Demerol 50 mg IV, Versed 5 mg IV, and topical 1% lidocaine of  vocal cords and airways during the procedure.   DESCRIPTION:  After obtaining informed and under close cardiopulmonary  monitoring, the above preoperative anesthesia was given and the fiberoptic  scope was passed through the left naris and into the posterior pharynx where  there were no lesions or other abnormalities seen.  Vocal cords appeared to  be normal and moved bilaterally on coughing.  The scope was then passed into  the trachea where it was examined down its entire length down to the level  of the carina, all of which was normal.  The left and right tracheobronchial  trees were examined serially to the subsegmental level with no endobronchial  abnormality being found except in the left lower lobe.  In the left lower  lobe there was a very large mucous plug that was thick and tenacious however  nonpurulent.  This plug was removed with aggressive suctioning and lavage as  well as topical Mucomyst.  Once the mucous plug was removed, the underlying  endobronchial epithelium was examined and there was no obvious abnormality.  The left lower lobe was patent at the end of the procedure.  Overall, the  patient tolerated the procedure well and there were no complications.           ______________________________  Marcelyn Bruins, M.D. [facility]     KC/MEDQ  D:  03/16/2005  T:  03/19/2005  Job:   [id number]   cc:   ______, Dr.   Janae Bridgeman. Eloise Harman., M.D.  Fax: [phone]

## 2010-08-11 NOTE — Assessment & Plan Note (Signed)
[facility]                             PULMONARY OFFICE NOTE   HERSHY, FLENNER                         MRN:          [id number]  DATE:03/06/2006                            DOB:          June 10, 1934    HISTORY:  This is a 76 year old black male seen on November 5 with  classic pseudo wheeze that I thought was probably due to ACE inhibitors  and was not able to really process the changes that were made then  including the elimination of ACE inhibitors.  He ended up in the  hospital with a COPD exacerbation and was found at that point to have  an IgE level of 1200 and treated as an asthmatic but continued to have  quite a bit of difficulty with refractory upper airway wheezing.  He  returns today feeling a new man on a complex medical regimen that was  reviewed in the column dated March 06, 2006 and includes Advair  500/50 one b.i.d..  Denies any exertional chest pain, orthopnea, PND or  leg swelling.  He denies any overt sinus or reflux symptoms.  Has  actually no more cough.   MEDICATIONS:  For more list of medications, please see face sheet column  dated March 06, 2006 which correlates with the medication calendar  that he carries with him.   PHYSICAL EXAMINATION:  GENERAL:  He is a pleasant,  minimally hoarse,  black male in no acute distress.  VITAL SIGNS:  Stable.  HEENT:  Unremarkable.  Oropharynx is clear.  CHEST:  Lung fields reveal diminished breath sounds but there is no  wheezing.  HEART:  Regular rhythm without murmur, gallop or rub.  ABDOMEN:  Soft, benign.  EXTREMITIES:  Warm without calf tenderness, cyanosis or clubbing or  edema.   IMPRESSION:  Dramatic improvement in functional status having eliminated  the ACE inhibitor and treated the asthmatic component of his problem.  It remains to be seen whether prednisone can be tapered off completely  and then whether or not the treatment directed at reflux (which probably  may  or may not have been present but certainly if it was present,  destabilized the airway in the contest of chronic ACE exposure).  I  recommended first trying to eliminate Reglan and then tapering down the  Protonix if he is able to successfully wean off the prednisone.  At that  point, I would try to also reduce Advair down to a lower level since it  may irritate the upper airway in such concentrations.   He will return in  two weeks for full medication reconciliation and to  continue to adjust the maintenance medicines to a lower number.   We need to keep in mind, however, that such high levels of IgE may be  seen in allergic bronchopulmonary aspergillosis which may remain  steroid-dependent, regarding of the efforts  to control reflux and eliminate ACE inhibitors from the list of  suspects for airway instability in general.     Casimiro Needle B. Sherene Sires, MD, [facility]  Electronically Signed    MBW/MedQ  DD:  03/06/2006  DT: 03/07/2006  Job #: [id number]   cc:   Duncan Dull, M.D.

## 2010-08-11 NOTE — Discharge Summary (Signed)
[name], [name]                ACCOUNT NO.:  [id number]   MEDICAL RECORD NO.:  [id number]          PATIENT TYPE:  INP   LOCATION:  3733                         FACILITY:  [facility]   PHYSICIAN:  Kela Millin, M.D.DATE OF BIRTH:  [birth year]   DATE OF ADMISSION:  03/15/2005  DATE OF DISCHARGE:  03/17/2005                                 DISCHARGE SUMMARY   DISCHARGE DIAGNOSES:  1.  Mucous plug/probable pneumonia:  Status post bronchoscopy with      suctioning and lavage.  2.  Chest pain:  Myocardial infarction ruled out. Patient to follow up for      outpatient Myoview.  3.  Chronic obstructive pulmonary disease.  4.  History of hyperlipidemia.   Dictation ended at this point.           ______________________________  Kela Millin, M.D.     ACV/MEDQ  D:  03/17/2005  T:  03/20/2005  Job:  [id number]

## 2010-09-23 ENCOUNTER — Emergency Department (HOSPITAL_COMMUNITY): Payer: Medicare Other

## 2010-09-23 ENCOUNTER — Emergency Department (HOSPITAL_COMMUNITY)
Admission: EM | Admit: 2010-09-23 | Discharge: 2010-09-23 | Disposition: A | Discharge status: Home / Self Care | Payer: Medicare Other | Attending: Emergency Medicine | Admitting: Emergency Medicine

## 2010-09-23 DIAGNOSIS — R262 Difficulty in walking, not elsewhere classified: Secondary | ICD-10-CM | POA: Insufficient documentation

## 2010-09-23 DIAGNOSIS — I10 Essential (primary) hypertension: Secondary | ICD-10-CM | POA: Insufficient documentation

## 2010-09-23 DIAGNOSIS — F0280 Dementia in other diseases classified elsewhere without behavioral disturbance: Secondary | ICD-10-CM | POA: Insufficient documentation

## 2010-09-23 DIAGNOSIS — J4489 Other specified chronic obstructive pulmonary disease: Secondary | ICD-10-CM | POA: Insufficient documentation

## 2010-09-23 DIAGNOSIS — G309 Alzheimer's disease, unspecified: Secondary | ICD-10-CM | POA: Insufficient documentation

## 2010-09-23 DIAGNOSIS — M76899 Other specified enthesopathies of unspecified lower limb, excluding foot: Secondary | ICD-10-CM | POA: Insufficient documentation

## 2010-09-23 DIAGNOSIS — I739 Peripheral vascular disease, unspecified: Secondary | ICD-10-CM

## 2010-09-23 DIAGNOSIS — J449 Chronic obstructive pulmonary disease, unspecified: Secondary | ICD-10-CM | POA: Insufficient documentation

## 2010-09-23 DIAGNOSIS — M25559 Pain in unspecified hip: Secondary | ICD-10-CM | POA: Insufficient documentation

## 2010-09-23 LAB — DIFFERENTIAL
Eosinophils Absolute: 0.7 10*3/uL (ref 0.0–0.7)
Lymphocytes Relative: 24 % (ref 12–46)
Lymphs Abs: 1.4 10*3/uL (ref 0.7–4.0)
Neutrophils Relative %: 55 % (ref 43–77)

## 2010-09-23 LAB — BASIC METABOLIC PANEL
CO2: 26 mEq/L (ref 19–32)
Chloride: 105 mEq/L (ref 96–112)
Creatinine, Ser: 1.08 mg/dL (ref 0.50–1.35)

## 2010-09-23 LAB — CBC
MCV: 87.7 fL (ref 78.0–100.0)
Platelets: 234 10*3/uL (ref 150–400)
RBC: 4.38 MIL/uL (ref 4.22–5.81)
WBC: 5.7 10*3/uL (ref 4.0–10.5)

## 2010-09-25 ENCOUNTER — Ambulatory Visit (INDEPENDENT_AMBULATORY_CARE_PROVIDER_SITE_OTHER): Payer: Medicare Other | Admitting: Vascular Surgery

## 2010-09-25 DIAGNOSIS — I70219 Atherosclerosis of native arteries of extremities with intermittent claudication, unspecified extremity: Secondary | ICD-10-CM

## 2010-09-26 NOTE — Assessment & Plan Note (Signed)
OFFICE VISIT  [name], [name] DOB:  11/21/1924                                       09/25/2010 [id number]#:[id number]  The patient is a 76 year old male patient well-known to me having undergone a right to left femoral-femoral bypass and left femoral- popliteal bypass graft in January of 2003 for severe claudication.  I saw him in January of this year and his bypass was stable with ABIs in the 60%-70% range bilaterally.  Recently he developed severe hip discomfort which occurs at rest and when changing positions and his wife took him to the emergency department on June 30.  He had a CT scan of the hip which revealed no acute findings, no dislocation and ABIs were performed which were quite stable from the previous studies at 52% on the right and 71% on the left.  He came in today for further evaluation.  CHRONIC MEDICAL PROBLEMS: 1. Dementia. 2. Diabetes. 3. Hypertension. 4. Hyperlipidemia. 5. COPD. 6. History of colon cancer.  SOCIAL HISTORY:  He is married, has 3 children, is retired.  Does not use tobacco or alcohol.  FAMILY HISTORY:  Positive for coronary artery disease in a brother and a massive MI.  REVIEW OF SYSTEMS:  The patient does have dementia.  He does not ambulate very long distances.  He does have asthma, dyspnea on exertion. All other systems are negative although he has had previous back surgery.  PHYSICAL EXAM:  Vital signs:  Blood pressure is 150/70, heart rate 80, respirations 14.  General:  He is an elderly male who is in no apparent distress, alert and oriented x3.  HEENT:  Normal for age.  EOMs intact. Chest:  Clear to auscultation.  No rhonchi or wheezing.  Cardiovascular: Regular rhythm.  No murmurs.  Abdomen:  Soft, nontender with no masses. Lower extremities:  Exam reveals 3+ femoral-femoral graft pulse with a 1- 2+ left popliteal pulse.  Both feet are well-perfused.  He does have some discomfort in the left hip with  flexion and extension.   DICTATION ENDS HERE    Quita Skye. Hart Rochester, M.D. Electronically Signed  JDL/MEDQ  D:  09/25/2010  T:  09/26/2010  Job:  [id number]  cc:   Candyce Churn. Allyne Gee, M.D.

## 2010-10-06 ENCOUNTER — Telehealth: Payer: Self-pay | Admitting: Internal Medicine

## 2010-10-06 NOTE — Telephone Encounter (Signed)
Samples of Symbicort 160/4.5 mcg left for pt to pick .

## 2010-10-10 ENCOUNTER — Other Ambulatory Visit: Payer: Self-pay | Admitting: Physical Medicine and Rehabilitation

## 2010-10-10 DIAGNOSIS — M48061 Spinal stenosis, lumbar region without neurogenic claudication: Secondary | ICD-10-CM

## 2010-10-12 MED ORDER — DIAZEPAM 2 MG PO TABS
5.0000 mg | ORAL_TABLET | Freq: Once | ORAL | Status: AC
  Administered 2010-10-13: 5 mg via ORAL

## 2010-10-13 ENCOUNTER — Ambulatory Visit
Admission: RE | Admit: 2010-10-13 | Discharge: 2010-10-13 | Disposition: A | Discharge status: Home / Self Care | Payer: Medicare Other | Source: Ambulatory Visit | Attending: Physical Medicine and Rehabilitation | Admitting: Physical Medicine and Rehabilitation

## 2010-10-13 DIAGNOSIS — M48061 Spinal stenosis, lumbar region without neurogenic claudication: Secondary | ICD-10-CM

## 2010-10-13 DIAGNOSIS — M545 Low back pain: Secondary | ICD-10-CM

## 2010-10-13 MED ORDER — IOHEXOL 180 MG/ML  SOLN
15.0000 mL | Freq: Once | INTRAMUSCULAR | Status: AC | PRN
  Administered 2010-10-13: 15 mL via INTRATHECAL

## 2010-10-13 NOTE — Progress Notes (Signed)
Explained discharge orders and headache precautions in detail to pt's daughter post myelo.dd

## 2010-12-21 LAB — DIFFERENTIAL
Basophils Absolute: 0.1
Basophils Relative: 1
Eosinophils Absolute: 0.2
Lymphocytes Relative: 22
Lymphs Abs: 1.5
Monocytes Relative: 11
Neutro Abs: 4.3

## 2010-12-21 LAB — CBC
HCT: 34.6 — ABNORMAL LOW
HCT: 34.6 — ABNORMAL LOW
HCT: 39.9
Hemoglobin: 13.1
MCHC: 34.3
Platelets: 199
Platelets: 210
RBC: 4.42
RDW: 14.6
WBC: 5.1
WBC: 6.8

## 2010-12-21 LAB — VITAMIN B12: Vitamin B-12: 433 (ref 211–911)

## 2010-12-21 LAB — BASIC METABOLIC PANEL
BUN: 10
BUN: 11
Calcium: 8.9
Creatinine, Ser: 0.99
GFR calc Af Amer: >60
GFR calc non Af Amer: >60
GFR calc non Af Amer: >60
Glucose, Bld: 117 — ABNORMAL HIGH
Potassium: 4.1
Potassium: 4.6

## 2010-12-21 LAB — COMPREHENSIVE METABOLIC PANEL
AST: <5
Albumin: 3.6
Calcium: 8.8
Creatinine, Ser: 1.14
GFR calc Af Amer: >60
Total Protein: 6.8

## 2010-12-21 LAB — CSF CULTURE W GRAM STAIN
Culture: NO GROWTH
Gram Stain: NONE SEEN

## 2010-12-21 LAB — AFB CULTURE WITH SMEAR (NOT AT ARMC): Acid Fast Smear: NONE SEEN

## 2010-12-21 LAB — CSF CELL COUNT WITH DIFFERENTIAL
Lymphs, CSF: 95 — ABNORMAL HIGH
Monocyte-Macrophage-Spinal Fluid: 2 — ABNORMAL LOW
RBC Count, CSF: 4 — ABNORMAL HIGH
Tube #: 1
WBC, CSF: 278 — ABNORMAL HIGH

## 2010-12-21 LAB — URINALYSIS, ROUTINE W REFLEX MICROSCOPIC
Glucose, UA: NEGATIVE
Ketones, ur: NEGATIVE
Specific Gravity, Urine: 1.026
pH: 5.5

## 2010-12-21 LAB — VDRL, CSF: VDRL Quant, CSF: NONREACTIVE

## 2010-12-21 LAB — APTT: aPTT: 32

## 2010-12-21 LAB — PROTIME-INR: INR: 0.9

## 2010-12-21 LAB — AMMONIA: Ammonia: 16

## 2010-12-21 LAB — SEDIMENTATION RATE: Sed Rate: 12

## 2010-12-26 LAB — BASIC METABOLIC PANEL
BUN: 12
BUN: 9
Calcium: 10
Chloride: 102
Creatinine, Ser: 1.15
GFR calc non Af Amer: >60
Glucose, Bld: 123 — ABNORMAL HIGH
Potassium: 3.9
Potassium: 5.1

## 2010-12-26 LAB — ABO/RH: ABO/RH(D): O POS

## 2010-12-26 LAB — TYPE AND SCREEN
ABO/RH(D): O POS
Antibody Screen: NEGATIVE

## 2010-12-26 LAB — CBC
HCT: 43.9
Platelets: 246
WBC: 5.8

## 2011-03-27 DIAGNOSIS — F0280 Dementia in other diseases classified elsewhere without behavioral disturbance: Secondary | ICD-10-CM

## 2011-03-27 HISTORY — DX: Dementia in other diseases classified elsewhere, unspecified severity, without behavioral disturbance, psychotic disturbance, mood disturbance, and anxiety: F02.80

## 2011-04-02 ENCOUNTER — Encounter (INDEPENDENT_AMBULATORY_CARE_PROVIDER_SITE_OTHER): Payer: Medicare Other | Admitting: Vascular Surgery

## 2011-04-02 DIAGNOSIS — Z48812 Encounter for surgical aftercare following surgery on the circulatory system: Secondary | ICD-10-CM

## 2011-04-02 DIAGNOSIS — I739 Peripheral vascular disease, unspecified: Secondary | ICD-10-CM

## 2011-04-05 ENCOUNTER — Ambulatory Visit
Admission: RE | Admit: 2011-04-05 | Discharge: 2011-04-05 | Disposition: A | Discharge status: Home / Self Care | Payer: Medicare Other | Source: Ambulatory Visit | Attending: Family Medicine | Admitting: Family Medicine

## 2011-04-05 ENCOUNTER — Other Ambulatory Visit: Payer: Self-pay | Admitting: Family Medicine

## 2011-04-05 ENCOUNTER — Telehealth: Payer: Self-pay | Admitting: Internal Medicine

## 2011-04-05 DIAGNOSIS — J449 Chronic obstructive pulmonary disease, unspecified: Secondary | ICD-10-CM

## 2011-04-05 DIAGNOSIS — R05 Cough: Secondary | ICD-10-CM

## 2011-04-05 DIAGNOSIS — R0600 Dyspnea, unspecified: Secondary | ICD-10-CM

## 2011-04-05 NOTE — Telephone Encounter (Signed)
Will let pt know he has not been seen since 01/23/10 and so therefore he need's OV to be seen since it has been over year

## 2011-04-05 NOTE — Telephone Encounter (Signed)
Pt is scheduled for OV 05/02/11 at 3:00. Nothing further was needed

## 2011-04-05 NOTE — Telephone Encounter (Signed)
Pt is downstairs getting Chest XRays completed right now-would like to pick up symbicort samples this evening.  [phone]  Antionette Fairy

## 2011-04-10 ENCOUNTER — Encounter: Payer: Self-pay | Admitting: Vascular Surgery

## 2011-04-10 NOTE — Procedures (Unsigned)
BYPASS GRAFT EVALUATION  INDICATION:  Follow up peripheral vascular disease.  HISTORY: Diabetes:  Yes. Cardiac:  MI. Hypertension:  Yes. Smoking:  Previous. Previous Surgery:  Right to left femoral-to-femoral bypass graft and left femoral-to-popliteal artery bypass graft on 04/02/2001.  SINGLE LEVEL ARTERIAL EXAM                              RIGHT              LEFT Brachial: Anterior tibial: Posterior tibial: Peroneal: Ankle/brachial index:        0.44               0.49 Toe/brachial index:          0.25               0.38  PREVIOUS ABI:  Date:  03/28/2010  RIGHT:  0.68  LEFT:  0.66  LOWER EXTREMITY BYPASS GRAFT DUPLEX EXAM:  DUPLEX:  Elevated velocities suggestive of stenosis at the proximal anastomosis of the right to left femoral-to-femoral bypass graft with a peak systolic velocity of 223 cm/s.  IMPRESSION: 1. Patent right to left femoral-femoral bypass graft with elevated     velocity as noted above. 2. Left femoral-to-popliteal artery bypass graft appears patent. 3. Dense calcific plaque noted at the right distal external iliac     artery/arterial inflow, making Doppler interrogation difficult and     maybe underestimating disease. 4. Bilateral ankle brachial indices are in the moderate-to-severe     claudication range. 5. Bilateral toe brachial indices are in the claudicant to critical     ischemia range. 6. Decrease in the ankle brachial indices since the previous study on     03/28/2010.        ___________________________________________ Quita Skye. Hart Rochester, M.D.  SH/MEDQ  D:  04/02/2011  T:  04/02/2011  Job:  [id number]

## 2011-05-01 ENCOUNTER — Encounter: Payer: Self-pay | Admitting: Internal Medicine

## 2011-05-02 ENCOUNTER — Ambulatory Visit: Payer: Medicare Other | Admitting: Internal Medicine

## 2011-05-04 ENCOUNTER — Encounter: Payer: Self-pay | Admitting: Internal Medicine

## 2011-05-04 ENCOUNTER — Ambulatory Visit (INDEPENDENT_AMBULATORY_CARE_PROVIDER_SITE_OTHER): Payer: Medicare Other | Admitting: Internal Medicine

## 2011-05-04 VITALS — BP 124/66 | HR 63 | Temp 98.4°F | Ht 71.0 in | Wt 191.0 lb

## 2011-05-04 DIAGNOSIS — J449 Chronic obstructive pulmonary disease, unspecified: Secondary | ICD-10-CM

## 2011-05-04 MED ORDER — BUDESONIDE-FORMOTEROL FUMARATE 160-4.5 MCG/ACT IN AERO: 2.0000 | INHALATION_SPRAY | Freq: Two times a day (BID) | RESPIRATORY_TRACT | Status: DC

## 2011-05-04 NOTE — Progress Notes (Signed)
Subjective:     Patient ID: [name], male   DOB: 1934-12-03  MRN: [id number]  HPI  Brief patient profile:  32  yobm s/p smoking cessation in 1995 previously felt to have COPD with the last spirometry in the chart dated 11/18/2000 showing classic upper airway obstruction with no convincing evidence of significant COPD or other forms of intrathoracic airflow obstruction based on the effort independent portion of the FV loop    HPI January 06, 2009 Followup with PFT's. Pt states that overall breathing has been okay. He gets SOB when he gets in a rush. [name] states that pt gets out of breath just walking from one room to the next. Also he c/o dry cough- especially at night x 4-6 weeks eval by Allyne Gee with cxr and rx cough syrup > no better. no purulent sputum. Try higher strength of Symbicort 160 2 puffs first thing in am and 2 puffs again in pm about 12 hours later and if happy with it fill the prescription.  as long as you are coughing take the zegerid otc at bedtime > seemed to help to pt and wife's satisfaction.  January 23, 2010 ov cc worse cough / sob since around 1st october p getting flu shot a week earlier and seen [facility] twice rx zpak and cough syrup then cxr with copd only. Pt denies any significant sore throat, dysphagia, itching, sneezing, nasal congestion or excess secretions, fever, chills, sweats, unintended wt loss, pleuritic or exertional cp, hempoptysis, change in activity tolerance orthopnea pnd or leg swelling Pt also denies any obvious fluctuation in symptoms with weather or environmental change or other alleviating or aggravating factors.  saba helps some    Past Medical History:  HYPERLIPIDEMIA (ICD-272.4)  PVD (ICD-443.9)  HYPERTENSION NEC  DYSPNEA (ICD-786.05)  COPD  PFT's 11/18/00 FEV1 is 1.94 or 60%  - PFT's 01/06/09 FEV1 1.75 (57%) ratio 49 and 21 % better FVC after B2 DLC0 71  - HFA 90% better p coaching 01/23/10   Problem # 1: COPD (ICD-496)   ?  adherence: worked on hfa technique > improved to 90% with coaching  ? acid reflux: resume diet/ zegrid     05/04/2011 ov/ Jena Tegeler cc sob much on symbicort to point where don't need the neb at all, nothing limited from doing by sob and no cough  Sleeping ok without nocturnal  or early am exacerbation  of respiratory  c/o's or need for noct saba. Also denies any obvious fluctuation of symptoms with weather or environmental changes or other aggravating or alleviating factors except as outlined above   ROS  At present neg for  any significant sore throat, dysphagia, itching, sneezing,  nasal congestion or excess/ purulent secretions,  fever, chills, sweats, unintended wt loss, pleuritic or exertional cp, hempoptysis, orthopnea pnd or leg swelling.  Also denies presyncope, palpitations, heartburn, abdominal pain, nausea, vomiting, diarrhea  or change in bowel or urinary habits, dysuria,hematuria,  rash, arthralgias, visual complaints, headache, numbness weakness or ataxia.      Review of Systems     Objective:   Physical Exam  elderly black male ambulatory mildly hoarse NAD  Wt 180 > 187 January 06, 2009 > 186 01/23/10 > 05/04/2011  191  HEENT mild turbinate edema. Oropharynx no thrush or excess pnd or cobblestoning. No JVD or cervical adenopathy. Mild accessory muscle hypertrophy. Trachea midline, nl thryroid. Chest was hyperinflated by percussion with diminished breath sounds and moderate increased exp time with trace bilateral mid expiratory  wheeze. Hoover sign positive at mid inspiration. Regular rate and rhythm without murmur gallop or rub or increase P2. No edema Abd: no hsm, nl excursion. Ext warm without cyanosis or clubbing.    Assessment:          Plan:

## 2011-05-04 NOTE — Patient Instructions (Signed)
If you are satisfied with your treatment plan let your doctor know and he/she can either refill your medications or you can return here when your prescription runs out.     If in any way you are not 100% satisfied,  please tell us.  If 100% better, tell your friends!  

## 2011-05-05 NOTE — Assessment & Plan Note (Signed)
PFT's 11/18/00 FEV1 is 1.94 or 60%  - PFT's 01/06/09 FEV1 1.75 (57%) ratio 49 and 21 % better FVC after B2 DLC0 71    GOLD II but significant AB component well addressed on symbicort  The proper method of use, as well as anticipated side effects, of this metered-dose inhaler are discussed and demonstrated to the patient. Improved to 90% with coaching.  Contingencies discussed, f/u can be prn

## 2011-10-02 ENCOUNTER — Encounter: Payer: Self-pay | Admitting: Neurosurgery

## 2011-10-03 ENCOUNTER — Ambulatory Visit: Payer: Medicare Other | Admitting: Neurosurgery

## 2011-10-16 ENCOUNTER — Encounter: Payer: Self-pay | Admitting: Neurosurgery

## 2011-10-17 ENCOUNTER — Ambulatory Visit (INDEPENDENT_AMBULATORY_CARE_PROVIDER_SITE_OTHER): Payer: Medicare Other | Admitting: *Deleted

## 2011-10-17 ENCOUNTER — Ambulatory Visit (INDEPENDENT_AMBULATORY_CARE_PROVIDER_SITE_OTHER): Payer: Medicare Other | Admitting: Neurosurgery

## 2011-10-17 ENCOUNTER — Encounter: Payer: Self-pay | Admitting: Neurosurgery

## 2011-10-17 ENCOUNTER — Encounter (INDEPENDENT_AMBULATORY_CARE_PROVIDER_SITE_OTHER): Payer: Medicare Other | Admitting: *Deleted

## 2011-10-17 VITALS — BP 139/72 | HR 62 | Resp 16 | Ht 72.0 in | Wt 176.0 lb

## 2011-10-17 DIAGNOSIS — I739 Peripheral vascular disease, unspecified: Secondary | ICD-10-CM

## 2011-10-17 DIAGNOSIS — Z48812 Encounter for surgical aftercare following surgery on the circulatory system: Secondary | ICD-10-CM

## 2011-10-17 NOTE — Progress Notes (Signed)
[facility] [place] [facility] Office Note  CC: Six-month bypass graft evaluation and ABIs Referring Physician: Hart Rochester  History of Present Illness: 77 year old male patient of Dr. Hart Rochester who status post a right to left fem-fem graft with a left femoropopliteal graft in January 2003. The patient and his wife state his tolerance ambulation has diminished however the patient states he has no pain in his lower extremities he just feels "weak". The patient's wife's states the patient's cardiologist has told him his and tolerance to activities do to his heart disease given the fact his bypass graft is usually shown to be patent when he visits our office. The patient's wife also states his cardiologist has requested the patient walk at least 15 minutes a day but has not been doing so. The patient and his wife deny any new medical diagnoses or recent surgery.  Past Medical History  Diagnosis Date  . Hyperlipidemia   . PVD (peripheral vascular disease)   . Hypertension   . Dyspnea   . COPD (chronic obstructive pulmonary disease)   . Colon cancer     ROS: [x]  Positive   [ ]  Denies    General: [ ]  Weight loss, [ ]  Fever, [ ]  chills Neurologic: [ ]  Dizziness, [ ]  Blackouts, [ ]  Seizure [ ]  Stroke, [ ]  "Mini stroke", [ ]  Slurred speech, [ ]  Temporary blindness; [ ]  weakness in arms or legs, [ ]  Hoarseness Cardiac: [ ]  Chest pain/pressure, [ ]  Shortness of breath at rest [ ]  Shortness of breath with exertion, [ ]  Atrial fibrillation or irregular heartbeat Vascular: [ ]  Pain in legs with walking, [ ]  Pain in legs at rest, [ ]  Pain in legs at night,  [ ]  Non-healing ulcer, [ ]  Blood clot in vein/DVT,   Pulmonary: [ ]  Home oxygen, [ ]  Productive cough, [ ]  Coughing up blood, [ ]  Asthma,  [ ]  Wheezing Musculoskeletal:  [ ]  Arthritis, [ ]  Low back pain, [ ]  Joint pain Hematologic: [ ]  Easy Bruising, [ ]  Anemia; [ ]  Hepatitis Gastrointestinal: [ ]  Blood in stool, [ ]  Gastroesophageal  Reflux/heartburn, [ ]  Trouble swallowing Urinary: [ ]  chronic Kidney disease, [ ]  on HD - [ ]  MWF or [ ]  TTHS, [ ]  Burning with urination, [ ]  Difficulty urinating Skin: [ ]  Rashes, [ ]  Wounds Psychological: [ ]  Anxiety, [ ]  Depression   Social History History  Substance Use Topics  . Smoking status: Former Smoker -- 0.3 packs/day for 40 years    Types: Cigarettes    Quit date: 03/26/1993  . Smokeless tobacco: Never Used  . Alcohol Use: No    Family History Family History  Problem Relation Age of Onset  . Stroke Mother 59    No Known Allergies  Current Outpatient Prescriptions  Medication Sig Dispense Refill  . albuterol (PROVENTIL) (2.5 MG/3ML) 0.083% nebulizer solution Take 2.5 mg by nebulization every 4 (four) hours as needed.      . budesonide-formoterol (SYMBICORT) 160-4.5 MCG/ACT inhaler Inhale 2 puffs into the lungs 2 (two) times daily.  1 Inhaler  11  . cholecalciferol (VITAMIN D) 1000 UNITS tablet Take 5,000 Units by mouth daily.      Marland Kitchen donepezil (ARICEPT) 5 MG tablet Take 12 mg by mouth 2 (two) times daily.       Marland Kitchen METFORMIN HCL PO Take by mouth daily.      Marland Kitchen olmesartan (BENICAR) 40 MG tablet Take 40 mg by mouth daily.      Marland Kitchen  rosuvastatin (CRESTOR) 20 MG tablet Take 20 mg by mouth daily.      . sertraline (ZOLOFT) 50 MG tablet Take 25 mg by mouth daily.        Physical Examination  Filed Vitals:   10/17/11 1605  BP: 139/72  Pulse: 62  Resp: 16    Body mass index is 23.87 kg/(m^2).  General:  WDWN in NAD Gait: Normal HEENT: WNL Eyes: Pupils equal Pulmonary: normal non-labored breathing , without Rales, rhonchi,  wheezing Cardiac: RRR, without  Murmurs, rubs or gallops; No carotid bruits Abdomen: soft, NT, no masses Skin: no rashes, ulcers noted Vascular Exam/Pulses:  Palpable femoral pulses bilaterally, lower extremity pulses to Doppler.  Extremities without ischemic changes, no Gangrene , no cellulitis; no open wounds;  Musculoskeletal: no muscle  wasting or atrophy  Neurologic: A&O X 3; Appropriate Affect ; SENSATION: normal; MOTOR FUNCTION:  moving all extremities equally. Speech is fluent/normal  Non-Invasive Vascular Imaging: Lower extremity duplex of the graft shows a patent right to left fem-fem graft with turbulent waveforms. Patent left femoropopliteal graft with velocities suggestive 50-75% stenosis of the distal anastomosis. ABIs today are unchanged from previous at 0.44 and monophasic on the right, 0.56 and monophasic on the left  ASSESSMENT/PLAN: Patient with significant activity intolerance question primary etiology. The patient reports no pain in his lower extremities, the patient's wife states they do not want to investigate this with further diagnostics at this time and have requested to followup with Dr. Hart Rochester in 6 months. The patient return for the above studies in 6 months, their questions were encouraged and answered.  Lauree Chandler ANP  Clinic M.D.: Edilia Bo

## 2011-10-18 NOTE — Addendum Note (Signed)
Addended by: Sharee Pimple on: 10/18/2011 07:59 AM   Modules accepted: Orders

## 2011-10-22 NOTE — Procedures (Unsigned)
BYPASS GRAFT EVALUATION  INDICATION:  Followup left fem-pop graft.  HISTORY: Diabetes:  Yes Cardiac:  Yes Hypertension:  Yes Smoking:  Previous Previous Surgery:  Right to left fem-fem graft with left fem-pop graft placed 04/02/2001.  SINGLE LEVEL ARTERIAL EXAM                              RIGHT              LEFT Brachial: Anterior tibial: Posterior tibial: Peroneal: Ankle/brachial index:        0.44               0.56  PREVIOUS ABI:  Date: 04/02/2011  RIGHT:  0.44  LEFT:  0.49  LOWER EXTREMITY BYPASS GRAFT DUPLEX EXAM:  DUPLEX:  Patent right to left fem-fem graft with significantly turbulent wave forms in the native right iliac inflow. Elevated velocities observed at the graft to graft anastomosis, likely due to angle of takeoff and caliber change. Patent left fem-pop graft with velocities suggestive of 50% to 75% stenosis at the distal anastomosis. All wave forms are monophasic.  IMPRESSION:  Patent right to left fem-fem graft with evidence of severe inflow stenosis. Patent left fem-pop graft with 50% to 75% stenosis of the distal anastomosis. Please see attached diagram for details.  ___________________________________________ [name], M.D.  LT/MEDQ  D:  10/17/2011  T:  10/17/2011  Job:  [id number]

## 2012-03-26 DIAGNOSIS — J189 Pneumonia, unspecified organism: Secondary | ICD-10-CM

## 2012-03-26 HISTORY — DX: Pneumonia, unspecified organism: J18.9

## 2012-04-08 ENCOUNTER — Other Ambulatory Visit: Payer: Self-pay | Admitting: Gastroenterology

## 2012-04-17 ENCOUNTER — Encounter: Payer: Self-pay | Admitting: Neurosurgery

## 2012-04-18 ENCOUNTER — Encounter (INDEPENDENT_AMBULATORY_CARE_PROVIDER_SITE_OTHER): Payer: Medicare Other | Admitting: *Deleted

## 2012-04-18 ENCOUNTER — Ambulatory Visit (INDEPENDENT_AMBULATORY_CARE_PROVIDER_SITE_OTHER): Payer: Medicare Other | Admitting: Neurosurgery

## 2012-04-18 ENCOUNTER — Encounter: Payer: Self-pay | Admitting: Neurosurgery

## 2012-04-18 VITALS — BP 140/65 | HR 71 | Resp 16 | Ht 72.0 in | Wt 175.0 lb

## 2012-04-18 DIAGNOSIS — I739 Peripheral vascular disease, unspecified: Secondary | ICD-10-CM

## 2012-04-18 DIAGNOSIS — Z48812 Encounter for surgical aftercare following surgery on the circulatory system: Secondary | ICD-10-CM

## 2012-04-18 DIAGNOSIS — M79609 Pain in unspecified limb: Secondary | ICD-10-CM

## 2012-04-18 DIAGNOSIS — M79606 Pain in leg, unspecified: Secondary | ICD-10-CM | POA: Insufficient documentation

## 2012-04-18 NOTE — Progress Notes (Signed)
[facility] [place] [facility] Office Note  CC: AAA surveillance Referring Physician: Hart Rochester  History of Present Illness: 78 year old male patient of Dr. Hart Rochester status post a right to left fem-fem bypass and a left femoral to above-the-knee popliteal bypass in January of 03. The patient has Alzheimer's and per his wife she feels his lower extremity claudication and pain have worsened over the past 3 or 4 months. The patient's wife denies any new medical diagnoses or recent surgery other than his primary care telling them he has "hardening of the arteries".  Past Medical History  Diagnosis Date  . Hyperlipidemia   . PVD (peripheral vascular disease)   . Hypertension   . Dyspnea   . COPD (chronic obstructive pulmonary disease)   . Colon cancer   . Diabetes mellitus   . Alzheimer's dementia 2013  . Pneumonia     ROS: [x]  Positive   [ ]  Denies    General: [ ]  Weight loss, [ ]  Fever, [ ]  chills Neurologic: [ ]  Dizziness, [ ]  Blackouts, [ ]  Seizure [ ]  Stroke, [ ]  "Mini stroke", [ ]  Slurred speech, [ ]  Temporary blindness; [ ]  weakness in arms or legs, [ ]  Hoarseness Cardiac: [ ]  Chest pain/pressure, [ ]  Shortness of breath at rest [ ]  Shortness of breath with exertion, [ ]  Atrial fibrillation or irregular heartbeat Vascular: [ ]  Pain in legs with walking, [ ]  Pain in legs at rest, [ ]  Pain in legs at night,  [ ]  Non-healing ulcer, [ ]  Blood clot in vein/DVT,   Pulmonary: [ ]  Home oxygen, [ ]  Productive cough, [ ]  Coughing up blood, [ ]  Asthma,  [ ]  Wheezing Musculoskeletal:  [ ]  Arthritis, [ ]  Low back pain, [ ]  Joint pain Hematologic: [ ]  Easy Bruising, [ ]  Anemia; [ ]  Hepatitis Gastrointestinal: [ ]  Blood in stool, [ ]  Gastroesophageal Reflux/heartburn, [ ]  Trouble swallowing Urinary: [ ]  chronic Kidney disease, [ ]  on HD - [ ]  MWF or [ ]  TTHS, [ ]  Burning with urination, [ ]  Difficulty urinating Skin: [ ]  Rashes, [ ]  Wounds Psychological: [ ]  Anxiety, [ ]   Depression   Social History History  Substance Use Topics  . Smoking status: Former Smoker -- 0.3 packs/day for 40 years    Types: Cigarettes    Quit date: 03/26/1993  . Smokeless tobacco: Never Used  . Alcohol Use: Yes    Family History Family History  Problem Relation Age of Onset  . Stroke Mother 75  . Hypertension Mother   . Cancer Sister   . Heart disease Brother     Heart Disease before age 64  . Heart attack Brother   . Cancer Sister   . Cancer Brother     No Known Allergies  Current Outpatient Prescriptions  Medication Sig Dispense Refill  . albuterol (PROVENTIL) (2.5 MG/3ML) 0.083% nebulizer solution Take 2.5 mg by nebulization every 4 (four) hours as needed.      . budesonide-formoterol (SYMBICORT) 160-4.5 MCG/ACT inhaler Inhale 2 puffs into the lungs 2 (two) times daily.  1 Inhaler  11  . clopidogrel (PLAVIX) 75 MG tablet 75 mg daily.       Marland Kitchen donepezil (ARICEPT) 5 MG tablet Take 12 mg by mouth 2 (two) times daily.       Marland Kitchen METFORMIN HCL PO Take by mouth daily.      Marland Kitchen olmesartan (BENICAR) 40 MG tablet Take 40 mg by mouth daily.      Marland Kitchen  rosuvastatin (CRESTOR) 20 MG tablet Take 20 mg by mouth daily.      . cholecalciferol (VITAMIN D) 1000 UNITS tablet Take 5,000 Units by mouth daily.      . sertraline (ZOLOFT) 50 MG tablet Take 25 mg by mouth daily.        Physical Examination  Filed Vitals:   04/18/12 1535  BP: 140/65  Pulse: 71  Resp: 16    Body mass index is 23.73 kg/(m^2).  General:  WDWN in NAD Gait: Normal HEENT: WNL Eyes: Pupils equal Pulmonary: normal non-labored breathing , without Rales, rhonchi,  wheezing Cardiac: RRR, without  Murmurs, rubs or gallops; No carotid bruits Abdomen: soft, NT, no masses Skin: no rashes, ulcers noted Vascular Exam/Pulses: Lower extremity pulses are not palpable however there is no discoloration or coolness to his feet  Extremities without ischemic changes, no Gangrene , no cellulitis; no open wounds;    Musculoskeletal: no muscle wasting or atrophy  Neurologic: A&O X 3; Appropriate Affect ; SENSATION: normal; MOTOR FUNCTION:  moving all extremities equally. Speech is fluent/normal  Non-Invasive Vascular Imaging: ABIs today are 0.43 and monophasic on the right, 0.45 and monophasic on the left with an occluded left femoropopliteal bypass in comparison with the last exam in July 2013.  ASSESSMENT/PLAN: I offered the patient's wife discussion with Dr. Imogene Burn, she has declined and asked to return to speak with Dr. Hart Rochester in the next one to 2 weeks. We will have that scheduled So she may return with her husband to discuss treatment options with Dr. Hart Rochester. The patient and his wife are in agreement with this plan.  Lauree Chandler ANP  Clinic M.D.: Imogene Burn

## 2012-04-21 ENCOUNTER — Ambulatory Visit (INDEPENDENT_AMBULATORY_CARE_PROVIDER_SITE_OTHER): Payer: Medicare Other | Admitting: Vascular Surgery

## 2012-04-21 ENCOUNTER — Encounter: Payer: Self-pay | Admitting: Vascular Surgery

## 2012-04-21 VITALS — BP 151/98 | HR 86 | Resp 20 | Ht 72.0 in | Wt 178.0 lb

## 2012-04-21 DIAGNOSIS — I739 Peripheral vascular disease, unspecified: Secondary | ICD-10-CM

## 2012-04-21 NOTE — Progress Notes (Signed)
Subjective:     Patient ID: [name], male   DOB: Apr 24, 1934, 78 y.o.   MRN: [id number]  HPI this 78 year old male and his wife return today to further discuss the recent finding of an occluded left femoral-popliteal bypass graft which I placed in 2003 along with a right to left femoral-femoral bypass. Patient does have dementia. He was seen by Kallie Edward on last Friday, January 24 and they requested further discussion with me. Patient has had some increasing claudication according to the wife. He is limited by his leg discomfort and dyspnea on exertion due to COPD. He has no history of gangrene, infection, cellulitis, or rest pain. He sleeps well at night.  Past Medical History  Diagnosis Date  . Hyperlipidemia   . PVD (peripheral vascular disease)   . Hypertension   . Dyspnea   . COPD (chronic obstructive pulmonary disease)   . Colon cancer   . Diabetes mellitus   . Alzheimer's dementia 2013  . Pneumonia     History  Substance Use Topics  . Smoking status: Former Smoker -- 0.3 packs/day for 40 years    Types: Cigarettes    Quit date: 03/26/1993  . Smokeless tobacco: Never Used  . Alcohol Use: Yes    Family History  Problem Relation Age of Onset  . Stroke Mother 39  . Hypertension Mother   . Cancer Sister   . Heart disease Brother     Heart Disease before age 66  . Heart attack Brother   . Cancer Sister   . Cancer Brother     No Known Allergies  Current outpatient prescriptions:albuterol (PROVENTIL) (2.5 MG/3ML) 0.083% nebulizer solution, Take 2.5 mg by nebulization every 4 (four) hours as needed., Disp: , Rfl: ;  budesonide-formoterol (SYMBICORT) 160-4.5 MCG/ACT inhaler, Inhale 2 puffs into the lungs 2 (two) times daily., Disp: 1 Inhaler, Rfl: 11;  cholecalciferol (VITAMIN D) 1000 UNITS tablet, Take 5,000 Units by mouth daily., Disp: , Rfl:  clopidogrel (PLAVIX) 75 MG tablet, 75 mg daily. , Disp: , Rfl: ;  donepezil (ARICEPT) 5 MG tablet, Take 12 mg by mouth 2 (two)  times daily. , Disp: , Rfl: ;  METFORMIN HCL PO, Take by mouth daily., Disp: , Rfl: ;  olmesartan (BENICAR) 40 MG tablet, Take 40 mg by mouth daily., Disp: , Rfl: ;  rosuvastatin (CRESTOR) 20 MG tablet, Take 20 mg by mouth daily., Disp: , Rfl: ;  sertraline (ZOLOFT) 50 MG tablet, Take 25 mg by mouth daily., Disp: , Rfl:   BP 151/98  Pulse 86  Resp 20  Ht 6' (1.829 m)  Wt 178 lb (80.74 kg)  BMI 24.14 kg/m2  Body mass index is 24.14 kg/(m^2).          Review of Systems denies chest pain but does have dyspnea on exertion and poor memory    Objective:   Physical Exam blood pressure 151/98 heart rate 86 respirations 20 General well-developed well-nourished male no apparent stress alert and oriented x3 Lungs no rhonchi or wheezing Lower extremities with 2-3+ femoral femoral bypass pulse palpable. Both feet have absent popliteal and distal pulses. No evidence of ischemia gangrene or cellulitis in the lower extremity. Motion and sensation intact.  ABI last week was 0.45 on the left and 0.43 on the right similar to previous studies. Most recent study on the left was 0.56 and July 2013 when the graft was patent.    Assessment:     Occlusion of left femoral-popliteal bypass graft  placed 10 years ago-no evidence of limb threatening ischemia-ABI 0.43    Plan:     No indication for redo bypass grafting unless patient develops limb threatening ischemia Discussed this at length with patient and his wife Will return in one year for repeat ABIs

## 2012-04-23 ENCOUNTER — Other Ambulatory Visit: Payer: Self-pay | Admitting: *Deleted

## 2012-04-23 DIAGNOSIS — Z48812 Encounter for surgical aftercare following surgery on the circulatory system: Secondary | ICD-10-CM

## 2012-04-23 DIAGNOSIS — I739 Peripheral vascular disease, unspecified: Secondary | ICD-10-CM

## 2012-06-02 ENCOUNTER — Telehealth: Payer: Self-pay | Admitting: Internal Medicine

## 2012-06-02 MED ORDER — ALBUTEROL SULFATE (2.5 MG/3ML) 0.083% IN NEBU: 2.5000 mg | INHALATION_SOLUTION | RESPIRATORY_TRACT | Status: DC | PRN

## 2012-06-02 NOTE — Telephone Encounter (Signed)
Called and spoke with patient spouse, requesting refill on patient albuterol neb. Pharmacy verified, medication filled and nothing further needed at this time.

## 2012-06-06 ENCOUNTER — Emergency Department (HOSPITAL_COMMUNITY): Payer: Medicare Other

## 2012-06-06 ENCOUNTER — Emergency Department (HOSPITAL_COMMUNITY)
Admission: EM | Admit: 2012-06-06 | Discharge: 2012-06-06 | Disposition: A | Discharge status: Home / Self Care | Payer: Medicare Other | Attending: Emergency Medicine | Admitting: Emergency Medicine

## 2012-06-06 ENCOUNTER — Encounter (HOSPITAL_COMMUNITY): Payer: Self-pay | Admitting: *Deleted

## 2012-06-06 DIAGNOSIS — Z79899 Other long term (current) drug therapy: Secondary | ICD-10-CM | POA: Insufficient documentation

## 2012-06-06 DIAGNOSIS — E785 Hyperlipidemia, unspecified: Secondary | ICD-10-CM | POA: Insufficient documentation

## 2012-06-06 DIAGNOSIS — F0280 Dementia in other diseases classified elsewhere without behavioral disturbance: Secondary | ICD-10-CM | POA: Insufficient documentation

## 2012-06-06 DIAGNOSIS — G309 Alzheimer's disease, unspecified: Secondary | ICD-10-CM | POA: Insufficient documentation

## 2012-06-06 DIAGNOSIS — J189 Pneumonia, unspecified organism: Secondary | ICD-10-CM | POA: Insufficient documentation

## 2012-06-06 DIAGNOSIS — Z85038 Personal history of other malignant neoplasm of large intestine: Secondary | ICD-10-CM | POA: Insufficient documentation

## 2012-06-06 DIAGNOSIS — Z8679 Personal history of other diseases of the circulatory system: Secondary | ICD-10-CM | POA: Insufficient documentation

## 2012-06-06 DIAGNOSIS — Z87891 Personal history of nicotine dependence: Secondary | ICD-10-CM | POA: Insufficient documentation

## 2012-06-06 DIAGNOSIS — E119 Type 2 diabetes mellitus without complications: Secondary | ICD-10-CM | POA: Insufficient documentation

## 2012-06-06 DIAGNOSIS — J441 Chronic obstructive pulmonary disease with (acute) exacerbation: Secondary | ICD-10-CM | POA: Insufficient documentation

## 2012-06-06 DIAGNOSIS — Z8701 Personal history of pneumonia (recurrent): Secondary | ICD-10-CM | POA: Insufficient documentation

## 2012-06-06 LAB — CBC WITH DIFFERENTIAL/PLATELET
Basophils Absolute: 0 10*3/uL (ref 0.0–0.1)
Basophils Relative: 0 % (ref 0–1)
Eosinophils Relative: 1 % (ref 0–5)
HCT: 36.5 % — ABNORMAL LOW (ref 39.0–52.0)
MCH: 24.9 pg — ABNORMAL LOW (ref 26.0–34.0)
MCHC: 31.5 g/dL (ref 30.0–36.0)
MCV: 79.2 fL (ref 78.0–100.0)
Monocytes Absolute: 1 10*3/uL (ref 0.1–1.0)
RDW: 15.2 % (ref 11.5–15.5)

## 2012-06-06 LAB — TROPONIN I: Troponin I: <0.3 ng/mL (ref <0.30)

## 2012-06-06 LAB — BASIC METABOLIC PANEL
Calcium: 9.4 mg/dL (ref 8.4–10.5)
Creatinine, Ser: 0.96 mg/dL (ref 0.50–1.35)
GFR calc Af Amer: >90 mL/min (ref >90)

## 2012-06-06 MED ORDER — PREDNISONE 20 MG PO TABS: 40.0000 mg | ORAL_TABLET | Freq: Every day | ORAL | Status: DC

## 2012-06-06 MED ORDER — IPRATROPIUM BROMIDE 0.02 % IN SOLN
0.5000 mg | Freq: Once | RESPIRATORY_TRACT | Status: AC
  Administered 2012-06-06: 0.5 mg via RESPIRATORY_TRACT
  Filled 2012-06-06: qty 2.5

## 2012-06-06 MED ORDER — LEVOFLOXACIN 750 MG PO TABS: 750.0000 mg | ORAL_TABLET | Freq: Every day | ORAL | Status: DC

## 2012-06-06 MED ORDER — PREDNISONE 20 MG PO TABS
60.0000 mg | ORAL_TABLET | Freq: Once | ORAL | Status: AC
  Administered 2012-06-06: 60 mg via ORAL
  Filled 2012-06-06: qty 3

## 2012-06-06 MED ORDER — ALBUTEROL SULFATE (5 MG/ML) 0.5% IN NEBU
10.0000 mg | INHALATION_SOLUTION | Freq: Once | RESPIRATORY_TRACT | Status: AC
  Administered 2012-06-06: 10 mg via RESPIRATORY_TRACT
  Filled 2012-06-06: qty 0.5
  Filled 2012-06-06: qty 1.5

## 2012-06-06 MED ORDER — SODIUM CHLORIDE 0.9 % IV SOLN
Freq: Once | INTRAVENOUS | Status: AC
  Administered 2012-06-06: 10:00:00 via INTRAVENOUS

## 2012-06-06 MED ORDER — LEVOFLOXACIN IN D5W 750 MG/150ML IV SOLN
750.0000 mg | Freq: Once | INTRAVENOUS | Status: AC
  Administered 2012-06-06: 750 mg via INTRAVENOUS
  Filled 2012-06-06: qty 150

## 2012-06-06 NOTE — ED Notes (Signed)
At the beginning of ambulation patient O2 saturation was 96% and heart rate of 80. During ambulation O2 sats fell to 91% and heart rate 130 but then came back up to 95% while walking back to bed. Pt did not complain of any additional shortness of breath and states "I feel all right".

## 2012-06-06 NOTE — ED Notes (Signed)
Pt ambulated in hallway O2 sat began at 100% then dropped down to 91% but quickly returned to 95 %.

## 2012-06-06 NOTE — ED Provider Notes (Signed)
History     CSN: [id number]  Arrival date & time 06/06/12  4098   First MD Initiated Contact with Patient 06/06/12 [phone]      Chief Complaint  Patient presents with  . Shortness of Breath    (Consider location/radiation/quality/duration/timing/severity/associated sxs/prior treatment) The history is provided by the patient and the spouse.  [name] is a 78 y.o. male hx of COPD, DM here presenting with shortness of breath. Intermittent cough and shortness of breath for the last 4 days. His wife been trying to give him albuterol and symbicort with minimal relief. Overnight he was in the bathroom and was unable to get up due to shortness of breath. No recent admission for COPD. He has not smoked for over 30 years. He has peripheral vascular disease with leg bypass but no CAD or cardiac stents. Denies any chest pain. Patient demented and unable to give a full history history as per wife/   Level V caveat- dementia    Past Medical History  Diagnosis Date  . Hyperlipidemia   . PVD (peripheral vascular disease)   . Hypertension   . Dyspnea   . COPD (chronic obstructive pulmonary disease)   . Colon cancer   . Diabetes mellitus   . Alzheimer's dementia 2013  . Pneumonia     Past Surgical History  Procedure Laterality Date  . Colon surgery  1996    cancer  . Spine surgery  2005    Family History  Problem Relation Age of Onset  . Stroke Mother 14  . Hypertension Mother   . Cancer Sister   . Heart disease Brother     Heart Disease before age 10  . Heart attack Brother   . Cancer Sister   . Cancer Brother     History  Substance Use Topics  . Smoking status: Former Smoker -- 0.30 packs/day for 40 years    Types: Cigarettes    Quit date: 03/26/1993  . Smokeless tobacco: Never Used  . Alcohol Use: Yes      Review of Systems  Respiratory: Positive for shortness of breath.   All other systems reviewed and are negative.    Allergies  Review of patient's  allergies indicates no known allergies.  Home Medications   Current Outpatient Rx  Name  Route  Sig  Dispense  Refill  . albuterol (PROVENTIL) (2.5 MG/3ML) 0.083% nebulizer solution   Nebulization   Take 3 mLs (2.5 mg total) by nebulization every 4 (four) hours as needed.   75 mL   3   . budesonide-formoterol (SYMBICORT) 160-4.5 MCG/ACT inhaler   Inhalation   Inhale 2 puffs into the lungs 2 (two) times daily.   1 Inhaler   11   . clopidogrel (PLAVIX) 75 MG tablet   Oral   Take 75 mg by mouth daily.          Marland Kitchen donepezil (ARICEPT) 5 MG tablet   Oral   Take 12 mg by mouth 2 (two) times daily.          . metFORMIN (GLUCOPHAGE) 500 MG tablet   Oral   Take 500 mg by mouth at bedtime.         Marland Kitchen olmesartan (BENICAR) 40 MG tablet   Oral   Take 40 mg by mouth daily.         . rosuvastatin (CRESTOR) 20 MG tablet   Oral   Take 20 mg by mouth daily.  BP 171/91  Pulse 86  Temp(Src) 97.7 F (36.5 C) (Oral)  Resp 18  SpO2 100%  Physical Exam  Nursing note and vitals reviewed. Constitutional: He is oriented to person, place, and time.  Chronically ill, slightly tachypneic, talking in full sentences   HENT:  Head: Normocephalic.  Mouth/Throat: Oropharynx is clear and moist.  Eyes: Conjunctivae are normal. Pupils are equal, round, and reactive to light.  Neck: Normal range of motion. Neck supple.  Cardiovascular: Normal rate, regular rhythm and normal heart sounds.   Pulmonary/Chest:  Slightly tachypneic, + diffuse wheezing. No retractions. No accessory muscle use   Abdominal: Soft. Bowel sounds are normal. He exhibits no distension. There is no tenderness. There is no rebound and no guarding.  Musculoskeletal: Normal range of motion. He exhibits no edema and no tenderness.  Neurological: He is alert and oriented to person, place, and time.  Skin: Skin is warm and dry.  Psychiatric: He has a normal mood and affect. His behavior is normal. Judgment and  thought content normal.    ED Course  Procedures (including critical care time)  Labs Reviewed  CBC WITH DIFFERENTIAL - Abnormal; Notable for the following:    WBC 14.7 (*)    Hemoglobin 11.5 (*)    HCT 36.5 (*)    MCH 24.9 (*)    Neutrophils Relative 86 (*)    Neutro Abs 12.6 (*)    Lymphocytes Relative 7 (*)    All other components within normal limits  BASIC METABOLIC PANEL - Abnormal; Notable for the following:    Glucose, Bld 152 (*)    GFR calc non Af Amer 79 (*)    All other components within normal limits  TROPONIN I   Dg Chest 2 View  06/06/2012  *RADIOLOGY REPORT*  Clinical Data: Sob, copd, confusion, hx of alzheimers, htn, hx of colon Ca  CHEST - 2 VIEW  Comparison: 04/05/2011  Findings: Atherosclerotic calcification of the aortic arch noted. Cardiac and mediastinal contours appear unremarkable.  Retrocardiac airspace opacity is new.  Faint linear and nodular opacities noted at the right lung base.  Subsegmental atelectasis or scarring in the left lower lobe.  Left posterior costophrenic angle is indistinct.  IMPRESSION:  1.  Retrocardiac airspace opacity compatible with left lower lobe pneumonia or atelectasis.  I recommend either follow-up chest radiography to ensure clearance, or chest CT. 2.  Faint linear and nodular opacities at the right lung base are likely inflammatory but also merits observation to ensure resolution.   Original Report Authenticated By: Gaylyn Rong, M.D.      No diagnosis found.   Date: 06/06/2012  Rate: 93  Rhythm: sinus arrhythmia  QRS Axis: normal  Intervals: normal  ST/T Wave abnormalities: nonspecific ST changes  Conduction Disutrbances:none  Narrative Interpretation: + PVCs new since prior  Old EKG Reviewed: changes noted    MDM  [name] is a 78 y.o. male here with SOB. Likely COPD vs pneumonia. CXR showed pneumonia. Will give nebs, steroids, abx. Will check labs and reassess.   8:30 AM Felt slightly better after  nebs. Levaquin ordered. Became tachycardic with ambulation. Will hydrate and reassess.   11:31 AM No wheezing now. Not short of breath now. When ambulating, PO2 dec to 91-92% but patient not SOB. I think this is a mild COPD exacerbation. Will d/c home on levaquin and course of prednisone and nebs. Return precautions given.          Richardean Canal, MD 06/06/12 [phone]

## 2012-06-06 NOTE — ED Notes (Signed)
Per wife pt got up to go to bathroom; an hour later pt found by wife sitting on toilet having difficulty breathing; denies pain

## 2012-06-08 ENCOUNTER — Other Ambulatory Visit: Payer: Self-pay | Admitting: Internal Medicine

## 2012-06-09 ENCOUNTER — Other Ambulatory Visit: Payer: Self-pay | Admitting: Internal Medicine

## 2012-07-04 ENCOUNTER — Encounter (HOSPITAL_COMMUNITY): Payer: Self-pay | Admitting: Emergency Medicine

## 2012-07-04 ENCOUNTER — Inpatient Hospital Stay (HOSPITAL_COMMUNITY)
Admission: EM | Admit: 2012-07-04 | Discharge: 2012-07-05 | DRG: 153 | Disposition: A | Discharge status: Home Health | Payer: Medicare Other | Attending: Internal Medicine | Admitting: Internal Medicine

## 2012-07-04 ENCOUNTER — Emergency Department (HOSPITAL_COMMUNITY): Payer: Medicare Other

## 2012-07-04 DIAGNOSIS — E785 Hyperlipidemia, unspecified: Secondary | ICD-10-CM

## 2012-07-04 DIAGNOSIS — I739 Peripheral vascular disease, unspecified: Secondary | ICD-10-CM

## 2012-07-04 DIAGNOSIS — F0390 Unspecified dementia without behavioral disturbance: Secondary | ICD-10-CM

## 2012-07-04 DIAGNOSIS — E860 Dehydration: Secondary | ICD-10-CM

## 2012-07-04 DIAGNOSIS — G309 Alzheimer's disease, unspecified: Secondary | ICD-10-CM | POA: Diagnosis present

## 2012-07-04 DIAGNOSIS — J069 Acute upper respiratory infection, unspecified: Secondary | ICD-10-CM

## 2012-07-04 DIAGNOSIS — R0602 Shortness of breath: Secondary | ICD-10-CM

## 2012-07-04 DIAGNOSIS — E119 Type 2 diabetes mellitus without complications: Secondary | ICD-10-CM | POA: Diagnosis present

## 2012-07-04 DIAGNOSIS — R059 Cough, unspecified: Secondary | ICD-10-CM

## 2012-07-04 DIAGNOSIS — IMO0002 Reserved for concepts with insufficient information to code with codable children: Secondary | ICD-10-CM

## 2012-07-04 DIAGNOSIS — R410 Disorientation, unspecified: Secondary | ICD-10-CM

## 2012-07-04 DIAGNOSIS — R05 Cough: Secondary | ICD-10-CM

## 2012-07-04 DIAGNOSIS — F29 Unspecified psychosis not due to a substance or known physiological condition: Secondary | ICD-10-CM | POA: Diagnosis present

## 2012-07-04 DIAGNOSIS — Z87891 Personal history of nicotine dependence: Secondary | ICD-10-CM

## 2012-07-04 DIAGNOSIS — Z79899 Other long term (current) drug therapy: Secondary | ICD-10-CM

## 2012-07-04 DIAGNOSIS — J449 Chronic obstructive pulmonary disease, unspecified: Secondary | ICD-10-CM | POA: Diagnosis present

## 2012-07-04 DIAGNOSIS — J0130 Acute sphenoidal sinusitis, unspecified: Secondary | ICD-10-CM | POA: Diagnosis present

## 2012-07-04 DIAGNOSIS — J441 Chronic obstructive pulmonary disease with (acute) exacerbation: Secondary | ICD-10-CM

## 2012-07-04 DIAGNOSIS — J4489 Other specified chronic obstructive pulmonary disease: Secondary | ICD-10-CM

## 2012-07-04 DIAGNOSIS — F0280 Dementia in other diseases classified elsewhere without behavioral disturbance: Secondary | ICD-10-CM | POA: Diagnosis present

## 2012-07-04 DIAGNOSIS — I10 Essential (primary) hypertension: Secondary | ICD-10-CM | POA: Diagnosis present

## 2012-07-04 DIAGNOSIS — Z85038 Personal history of other malignant neoplasm of large intestine: Secondary | ICD-10-CM

## 2012-07-04 DIAGNOSIS — R531 Weakness: Secondary | ICD-10-CM

## 2012-07-04 LAB — CBC WITH DIFFERENTIAL/PLATELET
Basophils Absolute: 0 10*3/uL (ref 0.0–0.1)
Basophils Relative: 1 % (ref 0–1)
Hemoglobin: 11.3 g/dL — ABNORMAL LOW (ref 13.0–17.0)
MCHC: 31.6 g/dL (ref 30.0–36.0)
Neutro Abs: 2.2 10*3/uL (ref 1.7–7.7)
Neutrophils Relative %: 60 % (ref 43–77)
Platelets: 338 10*3/uL (ref 150–400)
RDW: 16.2 % — ABNORMAL HIGH (ref 11.5–15.5)

## 2012-07-04 LAB — URINALYSIS, ROUTINE W REFLEX MICROSCOPIC
Glucose, UA: NEGATIVE mg/dL
Leukocytes, UA: NEGATIVE
Nitrite: NEGATIVE
Protein, ur: 30 mg/dL — AB

## 2012-07-04 LAB — BLOOD GAS, ARTERIAL
Bicarbonate: 21.7 mEq/L (ref 20.0–24.0)
Drawn by: 295031
FIO2: 0.21 %
Patient temperature: 98.6
pH, Arterial: 7.473 — ABNORMAL HIGH (ref 7.350–7.450)
pO2, Arterial: 77.4 mmHg — ABNORMAL LOW (ref 80.0–100.0)

## 2012-07-04 LAB — COMPREHENSIVE METABOLIC PANEL
ALT: 10 U/L (ref 0–53)
AST: 34 U/L (ref 0–37)
Calcium: 9.1 mg/dL (ref 8.4–10.5)
Sodium: 134 mEq/L — ABNORMAL LOW (ref 135–145)
Total Protein: 7.3 g/dL (ref 6.0–8.3)

## 2012-07-04 LAB — GLUCOSE, CAPILLARY

## 2012-07-04 LAB — CK: Total CK: 172 U/L (ref 7–232)

## 2012-07-04 LAB — PROTIME-INR
INR: 0.93 (ref 0.00–1.49)
Prothrombin Time: 12.4 seconds (ref 11.6–15.2)

## 2012-07-04 LAB — URINE MICROSCOPIC-ADD ON

## 2012-07-04 MED ORDER — ATORVASTATIN CALCIUM 20 MG PO TABS
20.0000 mg | ORAL_TABLET | Freq: Every day | ORAL | Status: DC
  Administered 2012-07-04: 20 mg via ORAL
  Filled 2012-07-04 (×2): qty 1

## 2012-07-04 MED ORDER — LEVOFLOXACIN IN D5W 750 MG/150ML IV SOLN: 750.0000 mg | INTRAVENOUS | Status: DC

## 2012-07-04 MED ORDER — CLOPIDOGREL BISULFATE 75 MG PO TABS
75.0000 mg | ORAL_TABLET | Freq: Every day | ORAL | Status: DC
  Administered 2012-07-05: 75 mg via ORAL
  Filled 2012-07-04: qty 1

## 2012-07-04 MED ORDER — GUAIFENESIN-DM 100-10 MG/5ML PO SYRP: 5.0000 mL | ORAL_SOLUTION | ORAL | Status: DC | PRN

## 2012-07-04 MED ORDER — ALBUTEROL SULFATE (5 MG/ML) 0.5% IN NEBU
2.5000 mg | INHALATION_SOLUTION | Freq: Two times a day (BID) | RESPIRATORY_TRACT | Status: DC
  Administered 2012-07-04 – 2012-07-05 (×2): 2.5 mg via RESPIRATORY_TRACT
  Filled 2012-07-04 (×2): qty 0.5

## 2012-07-04 MED ORDER — ASPIRIN 325 MG PO TABS
325.0000 mg | ORAL_TABLET | Freq: Every day | ORAL | Status: DC
  Administered 2012-07-04 – 2012-07-05 (×2): 325 mg via ORAL
  Filled 2012-07-04 (×3): qty 1

## 2012-07-04 MED ORDER — BUDESONIDE-FORMOTEROL FUMARATE 160-4.5 MCG/ACT IN AERO
2.0000 | INHALATION_SPRAY | Freq: Two times a day (BID) | RESPIRATORY_TRACT | Status: DC
  Administered 2012-07-04 – 2012-07-05 (×2): 2 via RESPIRATORY_TRACT
  Filled 2012-07-04: qty 6

## 2012-07-04 MED ORDER — ALBUTEROL SULFATE (5 MG/ML) 0.5% IN NEBU: 2.5000 mg | INHALATION_SOLUTION | RESPIRATORY_TRACT | Status: DC | PRN

## 2012-07-04 MED ORDER — HALOPERIDOL LACTATE 5 MG/ML IJ SOLN: 1.0000 mg | Freq: Four times a day (QID) | INTRAMUSCULAR | Status: DC | PRN

## 2012-07-04 MED ORDER — ACETAMINOPHEN 650 MG RE SUPP: 650.0000 mg | Freq: Four times a day (QID) | RECTAL | Status: DC | PRN

## 2012-07-04 MED ORDER — LEVOFLOXACIN IN D5W 500 MG/100ML IV SOLN
500.0000 mg | INTRAVENOUS | Status: DC
Start: 2012-07-05 — End: 2012-07-05
  Filled 2012-07-04: qty 100

## 2012-07-04 MED ORDER — IPRATROPIUM BROMIDE 0.02 % IN SOLN
0.5000 mg | Freq: Once | RESPIRATORY_TRACT | Status: AC
  Administered 2012-07-04: 0.5 mg via RESPIRATORY_TRACT
  Filled 2012-07-04: qty 2.5

## 2012-07-04 MED ORDER — ACETAMINOPHEN 325 MG PO TABS
650.0000 mg | ORAL_TABLET | Freq: Once | ORAL | Status: AC
  Administered 2012-07-04: 650 mg via ORAL
  Filled 2012-07-04: qty 2

## 2012-07-04 MED ORDER — ONDANSETRON HCL 4 MG/2ML IJ SOLN: 4.0000 mg | Freq: Four times a day (QID) | INTRAMUSCULAR | Status: DC | PRN

## 2012-07-04 MED ORDER — PREDNISONE 20 MG PO TABS
60.0000 mg | ORAL_TABLET | Freq: Once | ORAL | Status: AC
  Administered 2012-07-04: 60 mg via ORAL
  Filled 2012-07-04: qty 3

## 2012-07-04 MED ORDER — SODIUM CHLORIDE 0.9 % IV BOLUS (SEPSIS)
1000.0000 mL | Freq: Once | INTRAVENOUS | Status: AC
  Administered 2012-07-04: 1000 mL via INTRAVENOUS

## 2012-07-04 MED ORDER — ACETAMINOPHEN 325 MG PO TABS: 650.0000 mg | ORAL_TABLET | Freq: Four times a day (QID) | ORAL | Status: DC | PRN

## 2012-07-04 MED ORDER — ONDANSETRON HCL 4 MG PO TABS: 4.0000 mg | ORAL_TABLET | Freq: Four times a day (QID) | ORAL | Status: DC | PRN

## 2012-07-04 MED ORDER — ALBUTEROL SULFATE (5 MG/ML) 0.5% IN NEBU
5.0000 mg | INHALATION_SOLUTION | Freq: Once | RESPIRATORY_TRACT | Status: AC
  Administered 2012-07-04: 5 mg via RESPIRATORY_TRACT
  Filled 2012-07-04: qty 1

## 2012-07-04 MED ORDER — DONEPEZIL HCL 23 MG PO TABS
12.0000 mg | ORAL_TABLET | Freq: Two times a day (BID) | ORAL | Status: DC
  Administered 2012-07-04 – 2012-07-05 (×2): 11.5 mg via ORAL
  Filled 2012-07-04 (×3): qty 0.5

## 2012-07-04 MED ORDER — LEVOFLOXACIN IN D5W 750 MG/150ML IV SOLN
750.0000 mg | INTRAVENOUS | Status: DC
  Administered 2012-07-04: 750 mg via INTRAVENOUS
  Filled 2012-07-04: qty 150

## 2012-07-04 MED ORDER — IRBESARTAN 150 MG PO TABS
150.0000 mg | ORAL_TABLET | Freq: Every day | ORAL | Status: DC
  Administered 2012-07-05: 150 mg via ORAL
  Filled 2012-07-04: qty 1

## 2012-07-04 MED ORDER — SODIUM CHLORIDE 0.9 % IJ SOLN
3.0000 mL | Freq: Two times a day (BID) | INTRAMUSCULAR | Status: DC
  Administered 2012-07-04 – 2012-07-05 (×2): 3 mL via INTRAVENOUS

## 2012-07-04 MED ORDER — INSULIN ASPART 100 UNIT/ML ~~LOC~~ SOLN
0.0000 [IU] | Freq: Three times a day (TID) | SUBCUTANEOUS | Status: DC
  Administered 2012-07-05: 13:00:00 via SUBCUTANEOUS
  Administered 2012-07-05: 1 [IU] via SUBCUTANEOUS

## 2012-07-04 MED ORDER — METOPROLOL TARTRATE 50 MG PO TABS
50.0000 mg | ORAL_TABLET | Freq: Two times a day (BID) | ORAL | Status: DC
  Administered 2012-07-04 – 2012-07-05 (×2): 50 mg via ORAL
  Filled 2012-07-04 (×3): qty 1

## 2012-07-04 NOTE — H&P (Addendum)
[facility]                                                                                    Patient Demographics  [name], is a 78 y.o. male  CSN: [id number]  MRN: [id number]  DOB - 1934-11-29  Admit Date - 07/04/2012  Outpatient Primary MD for the patient is Gwynneth Aliment, MD   With History of -  Past Medical History  Diagnosis Date  . Hyperlipidemia   . PVD (peripheral vascular disease)   . Hypertension   . Dyspnea   . COPD (chronic obstructive pulmonary disease)   . Colon cancer   . Diabetes mellitus   . Alzheimer's dementia 2013  . Pneumonia       Past Surgical History  Procedure Laterality Date  . Colon surgery  1996    cancer  . Spine surgery  2005    in for   Chief Complaint  Patient presents with  . Weakness  . Fever  . Cough     HPI  [name]  is a 78 y.o. male, with advanced dementia, COPD, hypertension, type 2 diabetes mellitus, dyslipidemia who was diagnosed with a URI by his primary care physician few days ago and was placed on azithromycin yesterday is brought in by his wife for gradually worsening generalized weakness along with worsening confusion. She says sometime last night he should walked out into the hallway of the house and slept there for a few hours, She says that she has noticed some increased nonproductive cough, no fever chills, no shortness of breath, no headache chest pain or abdominal pain. No diarrhea.    Patient himself is lying in hospital bed in a pleasant mood and pleasantly confused, he completely denies any subjective complaints whatsoever, in the ER workup suggestive of URI, I was called to admit the patient.     Review of Systems    In addition to the HPI above,   No Fever-chills, No Headache, No changes with Vision or hearing, No problems swallowing food or Liquids, No Chest pain, Mild non productive Cough but no  Shortness of Breath, No Abdominal pain, No Nausea or Vommitting, Bowel  movements are regular, No Blood in stool or Urine, No dysuria, No new skin rashes or bruises, No new joints pains-aches,  No new weakness, tingling, numbness in any extremity, but generalized weakness and increased confusion. No recent weight gain or loss, No polyuria, polydypsia or polyphagia, No significant Mental Stressors.  A full 10 point Review of Systems was done, except as stated above, all other Review of Systems were negative.   Social History History  Substance Use Topics  . Smoking status: Former Smoker -- 0.30 packs/day for 40 years    Types: Cigarettes    Quit date: 03/26/1993  . Smokeless tobacco: Never Used  . Alcohol Use: Yes      Family History Family History  Problem Relation Age of Onset  . Stroke Mother 53  . Hypertension Mother   . Cancer Sister   . Heart disease Brother     Heart Disease before age 19  . Heart attack Brother   . Cancer  Sister   . Cancer Brother      Prior to Admission medications   Medication Sig Start Date End Date Taking? Authorizing Provider  albuterol (PROVENTIL) (2.5 MG/3ML) 0.083% nebulizer solution Take 3 mLs (2.5 mg total) by nebulization every 4 (four) hours as needed. 06/02/12  Yes Nyoka Cowden, MD  aspirin 325 MG tablet Take 325 mg by mouth daily.   Yes Historical Provider, MD  clopidogrel (PLAVIX) 75 MG tablet Take 75 mg by mouth daily.  04/04/12  Yes Historical Provider, MD  donepezil (ARICEPT) 5 MG tablet Take 12 mg by mouth 2 (two) times daily.    Yes Historical Provider, MD  metFORMIN (GLUCOPHAGE) 500 MG tablet Take 500 mg by mouth at bedtime.   Yes Historical Provider, MD  olmesartan (BENICAR) 40 MG tablet Take 40 mg by mouth daily.   Yes Historical Provider, MD  rosuvastatin (CRESTOR) 20 MG tablet Take 20 mg by mouth daily.   Yes Historical Provider, MD  SYMBICORT 160-4.5 MCG/ACT inhaler INHALE TWO PUFFS BY MOUTH TWICE DAILY 06/09/12  Yes Nyoka Cowden, MD    No Known Allergies  Physical  Exam  Vitals  Blood pressure 171/79, pulse 106, temperature 99.2 F (37.3 C), temperature source Rectal, resp. rate 20, SpO2 95.00%.   1. General pleasant elderly African American male lying in bed in NAD,     2. Normal affect and insight, Not Suicidal or Homicidal, Awake , pleasantly confused, Oriented X 1  3. No F.N deficits, ALL C.Nerves Intact, Strength 5/5 all 4 extremities, Sensation intact all 4 extremities, Plantars down going.  4. Ears and Eyes appear Normal, Conjunctivae clear, PERRLA. Moist Oral Mucosa.  5. Supple Neck, No JVD, No cervical lymphadenopathy appriciated, No Carotid Bruits.  6. Symmetrical Chest wall movement, Good air movement bilaterally, CTAB.  7. RRR, No Gallops, Rubs or Murmurs, No Parasternal Heave.  8. Positive Bowel Sounds, Abdomen Soft, Non tender, No organomegaly appriciated,No rebound -guarding or rigidity.  9.  No Cyanosis, Normal Skin Turgor, No Skin Rash or Bruise.  10. Good muscle tone,  joints appear normal , no effusions, Normal ROM.  11. No Palpable Lymph Nodes in Neck or Axillae     Data Review  CBC  Recent Labs Lab 07/04/12 1349  WBC 3.6*  HGB 11.3*  HCT 35.8*  PLT 338  MCV 76.7*  MCH 24.2*  MCHC 31.6  RDW 16.2*  LYMPHSABS 0.8  MONOABS 0.7  EOSABS 0.0  BASOSABS 0.0   ------------------------------------------------------------------------------------------------------------------  Chemistries   Recent Labs Lab 07/04/12 1349  NA 134*  K 4.0  CL 98  CO2 23  GLUCOSE 150*  BUN 18  CREATININE 0.86  CALCIUM 9.1  AST 34  ALT 10  ALKPHOS 87  BILITOT 0.4   ------------------------------------------------------------------------------------------------------------------ CrCl is unknown because both a height and weight (above a minimum accepted value) are required for this calculation. ------------------------------------------------------------------------------------------------------------------ No results  found for this basename: TSH, T4TOTAL, FREET3, T3FREE, THYROIDAB,  in the last 72 hours   Coagulation profile  Recent Labs Lab 07/04/12 1349  INR 0.93   ------------------------------------------------------------------------------------------------------------------- No results found for this basename: DDIMER,  in the last 72 hours -------------------------------------------------------------------------------------------------------------------  Cardiac Enzymes No results found for this basename: CK, CKMB, TROPONINI, MYOGLOBIN,  in the last 168 hours ------------------------------------------------------------------------------------------------------------------ No components found with this basename: POCBNP,    ---------------------------------------------------------------------------------------------------------------  Urinalysis    Component Value Date/Time   COLORURINE YELLOW 09/29/2007 2145   APPEARANCEUR CLEAR 09/29/2007 2145   LABSPEC 1.026 09/29/2007 2145  PHURINE 5.5 09/29/2007 2145   GLUCOSEU NEGATIVE 09/29/2007 2145   HGBUR NEGATIVE 09/29/2007 2145   BILIRUBINUR NEGATIVE 09/29/2007 2145   KETONESUR NEGATIVE 09/29/2007 2145   PROTEINUR NEGATIVE 09/29/2007 2145   UROBILINOGEN 1.0 09/29/2007 2145   NITRITE NEGATIVE 09/29/2007 2145   LEUKOCYTESUR NEGATIVE MICROSCOPIC NOT DONE ON URINES WITH NEGATIVE PROTEIN, BLOOD, LEUKOCYTES, NITRITE, OR GLUCOSE <1000 mg/dL. 09/29/2007 2145    ----------------------------------------------------------------------------------------------------------------  Imaging results:   Dg Chest 2 View  07/04/2012  *RADIOLOGY REPORT*  Clinical Data: Fever.  Cough.  Generalized weakness.  Current history of COPD, diabetes, and Alzheimer's dementia.  CHEST - 2 VIEW  Comparison: Two-view chest x-ray 06/06/2012, 04/05/2011, 01/09/2010.  Findings: Hyperinflation and emphysematous changes throughout both lungs, unchanged.  Lungs clear.  Bronchovascular markings  normal. Pulmonary vascularity normal.  No pneumothorax.  No pleural effusions.  Cardiac silhouette normal in size, unchanged.  Thoracic aorta atherosclerotic, unchanged.  Mildly prominent central pulmonary arteries, unchanged.  IMPRESSION: COPD/emphysema.  No acute cardiopulmonary disease.   Original Report Authenticated By: Hulan Saas, M.D.    Dg Chest 2 View  06/06/2012  *RADIOLOGY REPORT*  Clinical Data: Sob, copd, confusion, hx of alzheimers, htn, hx of colon Ca  CHEST - 2 VIEW  Comparison: 04/05/2011  Findings: Atherosclerotic calcification of the aortic arch noted. Cardiac and mediastinal contours appear unremarkable.  Retrocardiac airspace opacity is new.  Faint linear and nodular opacities noted at the right lung base.  Subsegmental atelectasis or scarring in the left lower lobe.  Left posterior costophrenic angle is indistinct.  IMPRESSION:  1.  Retrocardiac airspace opacity compatible with left lower lobe pneumonia or atelectasis.  I recommend either follow-up chest radiography to ensure clearance, or chest CT. 2.  Faint linear and nodular opacities at the right lung base are likely inflammatory but also merits observation to ensure resolution.   Original Report Authenticated By: Gaylyn Rong, M.D.      EKG baseline ordered    Assessment & Plan    1.URI in a patient with advanced dementia and underlying COPD. Chest x-ray is unremarkable, lab work appears stable, will obtain sputum and blood cultures but I doubt if they would need any further information, will place him in Levaquin, CT of the head was obtained which suggest acute sinusitis, nebulizer treatments and oxygen as needed.      2. Acute left sphenoidal sinusitis which is noted incidentally on CT of the head. Levaquin as above then outpatient ENT followup, he has no sinus tenderness or subjective complaints. No headache.     3. Generalized weakness and worsening of underlying dementia. Likely due to #1 above plus  gradual progression of his advanced dementia, supportive care as above, PT evaluation, continue home dose Aricept, this could be his new baseline and this has been explained to the wife, wife made aware that patient might get delirious while in the hospital. Will check a UA with culture. Will check baseline TSH, will check CK as he was lying in the hallway for unclear duration. Baseline EKG has also been ordered. Although he has no chest symptoms whatsoever.    4. Hypertension. Place on low-dose Lopressor and monitor.      5. Peripheral vascular disease. Continue aspirin, Plavix & statin at home dose. No acute issues.    6. Dyslipidemia continue home dose statin. Monitor CK levels which were ordered for #3 above.     DVT Prophylaxis  - SCDs plus home aspirin and Plavix  AM Labs Ordered, also please review Full  Orders  Family Communication: Admission, patients condition and plan of care including tests being ordered have been discussed with the patient and family who indicate understanding and agree with the plan and Code Status.  Code Status Full  Likely DC to Home  Time spent in minutes : 35  Condition Marinell Blight K M.D on 07/04/2012 at 3:27 PM  Between 7am to 7pm - Pager - [phone]  After 7pm go to [email or web address] - password [email or web address]  And look for the night coverage person covering me after hours  [facility] Office  [phone]

## 2012-07-04 NOTE — ED Provider Notes (Addendum)
History     CSN: [id number]  Arrival date & time 07/04/12  1259   First MD Initiated Contact with Patient 07/04/12 1328      Chief Complaint  Patient presents with  . Weakness  . Fever  . Cough    (Consider location/radiation/quality/duration/timing/severity/associated sxs/prior treatment) Patient is a 78 y.o. male presenting with cough. The history is provided by the spouse.  Cough Cough characteristics:  Productive Sputum characteristics:  White and yellow Severity:  Moderate Onset quality:  Gradual Duration:  4 days Timing:  Constant Progression:  Worsening Chronicity:  New Smoker: no   Context: upper respiratory infection   Relieved by: mild improvement with inhalers/nebs. Associated symptoms: fever, shortness of breath and wheezing   Associated symptoms: no rhinorrhea   Associated symptoms comment:  Confusion Fever:    Duration:  4 days   Timing:  Intermittent   Max temp PTA (F):  100.7   Temp source:  Oral   Progression:  Unchanged Risk factors comment:  Seen by PCP 2 days ago and started on azithromycin which he took first dose yesterday   Past Medical History  Diagnosis Date  . Hyperlipidemia   . PVD (peripheral vascular disease)   . Hypertension   . Dyspnea   . COPD (chronic obstructive pulmonary disease)   . Colon cancer   . Diabetes mellitus   . Alzheimer's dementia 2013  . Pneumonia     Past Surgical History  Procedure Laterality Date  . Colon surgery  1996    cancer  . Spine surgery  2005    Family History  Problem Relation Age of Onset  . Stroke Mother 71  . Hypertension Mother   . Cancer Sister   . Heart disease Brother     Heart Disease before age 100  . Heart attack Brother   . Cancer Sister   . Cancer Brother     History  Substance Use Topics  . Smoking status: Former Smoker -- 0.30 packs/day for 40 years    Types: Cigarettes    Quit date: 03/26/1993  . Smokeless tobacco: Never Used  . Alcohol Use: Yes      Review  of Systems  Constitutional: Positive for fever.  HENT: Negative for rhinorrhea.   Respiratory: Positive for cough, shortness of breath and wheezing.   Neurological: Positive for weakness.  Psychiatric/Behavioral: Positive for confusion.  All other systems reviewed and are negative.    Allergies  Review of patient's allergies indicates no known allergies.  Home Medications   Current Outpatient Rx  Name  Route  Sig  Dispense  Refill  . albuterol (PROVENTIL) (2.5 MG/3ML) 0.083% nebulizer solution   Nebulization   Take 3 mLs (2.5 mg total) by nebulization every 4 (four) hours as needed.   75 mL   3   . clopidogrel (PLAVIX) 75 MG tablet   Oral   Take 75 mg by mouth daily.          Marland Kitchen donepezil (ARICEPT) 5 MG tablet   Oral   Take 12 mg by mouth 2 (two) times daily.          Marland Kitchen levofloxacin (LEVAQUIN) 750 MG tablet   Oral   Take 1 tablet (750 mg total) by mouth daily. X 7 days   7 tablet   0   . metFORMIN (GLUCOPHAGE) 500 MG tablet   Oral   Take 500 mg by mouth at bedtime.         Marland Kitchen olmesartan (BENICAR)  40 MG tablet   Oral   Take 40 mg by mouth daily.         . predniSONE (DELTASONE) 20 MG tablet   Oral   Take 2 tablets (40 mg total) by mouth daily.   8 tablet   0   . rosuvastatin (CRESTOR) 20 MG tablet   Oral   Take 20 mg by mouth daily.         . SYMBICORT 160-4.5 MCG/ACT inhaler      INHALE TWO PUFFS BY MOUTH TWICE DAILY   1 Inhaler   11     BP 171/79  Pulse 106  Temp(Src) 100.2 F (37.9 C) (Oral)  Resp 20  SpO2 100%  Physical Exam  Nursing note and vitals reviewed. Constitutional: He is oriented to person, place, and time. He appears well-developed and well-nourished. No distress.  HENT:  Head: Normocephalic and atraumatic.  Right Ear: Tympanic membrane and ear canal normal.  Left Ear: Tympanic membrane and ear canal normal.  Mouth/Throat: Oropharynx is clear and moist and mucous membranes are normal.  Eyes: Conjunctivae and EOM are  normal. Pupils are equal, round, and reactive to light.  Neck: Normal range of motion. Neck supple.  Cardiovascular: Regular rhythm and intact distal pulses.  Tachycardia present.   No murmur heard. Pulmonary/Chest: Tachypnea noted. No respiratory distress. He has wheezes. He has no rales.  Abdominal: Soft. He exhibits no distension. There is no tenderness. There is no rebound and no guarding.  Musculoskeletal: Normal range of motion. He exhibits no edema and no tenderness.  Neurological: He is alert and oriented to person, place, and time.  Skin: Skin is warm and dry. No rash noted. No erythema.  Psychiatric: He has a normal mood and affect. His behavior is normal.    ED Course  Procedures (including critical care time)  Labs Reviewed  CBC WITH DIFFERENTIAL - Abnormal; Notable for the following:    WBC 3.6 (*)    Hemoglobin 11.3 (*)    HCT 35.8 (*)    MCV 76.7 (*)    MCH 24.2 (*)    RDW 16.2 (*)    Monocytes Relative 18 (*)    All other components within normal limits  COMPREHENSIVE METABOLIC PANEL - Abnormal; Notable for the following:    Sodium 134 (*)    Glucose, Bld 150 (*)    GFR calc non Af Amer 83 (*)    All other components within normal limits  BLOOD GAS, ARTERIAL - Abnormal; Notable for the following:    pH, Arterial 7.473 (*)    pCO2 arterial 29.9 (*)    pO2, Arterial 77.4 (*)    All other components within normal limits  GLUCOSE, CAPILLARY - Abnormal; Notable for the following:    Glucose-Capillary 138 (*)    All other components within normal limits  PROTIME-INR  CG4 I-STAT (LACTIC ACID)   Dg Chest 2 View  07/04/2012  *RADIOLOGY REPORT*  Clinical Data: Fever.  Cough.  Generalized weakness.  Current history of COPD, diabetes, and Alzheimer's dementia.  CHEST - 2 VIEW  Comparison: Two-view chest x-ray 06/06/2012, 04/05/2011, 01/09/2010.  Findings: Hyperinflation and emphysematous changes throughout both lungs, unchanged.  Lungs clear.  Bronchovascular markings  normal. Pulmonary vascularity normal.  No pneumothorax.  No pleural effusions.  Cardiac silhouette normal in size, unchanged.  Thoracic aorta atherosclerotic, unchanged.  Mildly prominent central pulmonary arteries, unchanged.  IMPRESSION: COPD/emphysema.  No acute cardiopulmonary disease.   Original Report Authenticated By: Hulan Saas, M.D.  Date: 07/04/2012  Rate: 101  Rhythm: sinus tachycardia with PVC  QRS Axis: normal  Intervals: normal  ST/T Wave abnormalities: nonspecific ST/T changes  Conduction Disutrbances:none  Narrative Interpretation:   Old EKG Reviewed: unchanged   1. Dehydration   2. Confusion   3. Weakness   4. COPD exacerbation   5. URI (upper respiratory infection)       MDM   D. the had a fever for the last 4 days and productive cough with worsening shortness of breath. Patient saw his PCP 2 days ago and was prescribed azithromycin which he is taking 2 days. However wife states he's becoming more confused having worsening shortness of breath and persistent fever despite being on antibiotic.  Patient has diffuse wheezing on exam but is otherwise awake and alert without any chest or abdominal pain. Patient given albuterol and Atrovent. Concern for infectious etiology at this time. EKG showed sinus tachycardia but otherwise no significant change. CBC, INR, CMP, lactic acid, chest x-ray pending  2:35 PM Labs are unrevealing including a normal lactic acid and a chest x-ray was within normal limits. Patient had a UA done 2 days ago which was within normal limits.  After albuterol, Atrovent, prednisone and IV fluids patient's wheezing is improved.  2:54 PM However speaking with his wife she does not feel comfortable taking the patient home as he has been unable to ambulate around the home and she has been attempting to carry him. Also she stated last night that she found him in the living room on the floor and is unclear if he fell work laid down due to  weakness. He also had not been eating or drinking. Feel that it is reasonable that patient be admitted for COPD exacerbation fever of unknown origin most likely from an upper respiratory infection and dehydration.    Gwyneth Sprout, MD 07/04/12 1454  Gwyneth Sprout, MD 07/04/12 1455

## 2012-07-04 NOTE — ED Notes (Signed)
Report called to floor.  Patient being cathed to get urine then up to floor.

## 2012-07-04 NOTE — Progress Notes (Addendum)
ANTIBIOTIC CONSULT NOTE - INITIAL  Pharmacy Consult for Levaquin  Indication: URI  No Known Allergies  Patient Measurements: Height: 5\' 11"  (180.3 cm) Weight: 74 lb 4.8 oz (33.702 kg) IBW/kg (Calculated) : 75.3   Vital Signs: Temp: 97.7 F (36.5 C) (04/11 1709) Temp src: Oral (04/11 1709) BP: 145/68 mmHg (04/11 1709) Pulse Rate: 91 (04/11 1545) Intake/Output from previous day:   Intake/Output from this shift:    Labs:  Recent Labs  07/04/12 1349  WBC 3.6*  HGB 11.3*  PLT 338  CREATININE 0.86   Estimated Creatinine Clearance: 35.4 ml/min (by C-G formula based on Cr of 0.86). No results found for this basename: VANCOTROUGH, VANCOPEAK, VANCORANDOM, GENTTROUGH, GENTPEAK, GENTRANDOM, TOBRATROUGH, TOBRAPEAK, TOBRARND, AMIKACINPEAK, AMIKACINTROU, AMIKACIN,  in the last 72 hours   Microbiology: No results found for this or any previous visit (from the past 720 hour(s)).  Medical History: Past Medical History  Diagnosis Date  . Hyperlipidemia   . PVD (peripheral vascular disease)   . Hypertension   . Dyspnea   . COPD (chronic obstructive pulmonary disease)   . Colon cancer   . Diabetes mellitus   . Alzheimer's dementia 2013  . Pneumonia     Medications:  Scheduled:  . [COMPLETED] acetaminophen  650 mg Oral Once  . albuterol  2.5 mg Nebulization BID  . [COMPLETED] albuterol  5 mg Nebulization Once  . aspirin  325 mg Oral Daily  . atorvastatin  20 mg Oral q1800  . budesonide-formoterol  2 puff Inhalation BID  . clopidogrel  75 mg Oral Daily  . donepezil  11.5 mg Oral BID  . [START ON 07/05/2012] insulin aspart  0-9 Units Subcutaneous TID WC  . [COMPLETED] ipratropium  0.5 mg Nebulization Once  . irbesartan  150 mg Oral Daily  . levofloxacin (LEVAQUIN) IV  750 mg Intravenous Q24H  . metoprolol tartrate  50 mg Oral BID  . [COMPLETED] predniSONE  60 mg Oral Once  . [COMPLETED] sodium chloride  1,000 mL Intravenous Once  . sodium chloride  3 mL Intravenous Q12H    Infusions:   PRN: acetaminophen, acetaminophen, albuterol, guaiFENesin-dextromethorphan, haloperidol lactate, ondansetron (ZOFRAN) IV, ondansetron Assessment:  78 yo M with hx of COPD and dementia with upper respiratory infection, starting empiric Levaquin.   SCr is wnl, however patients weight is 33.7 kg (trying verify weight with RN on floor).  This makes his estimated CrCl 35 ml/min, Normalized CrCl is 72 ml/min.  Will reduce his dose to 500 mg Q24h  Blood and urine cultures sent   Goal of Therapy:  Levaquin per pharmacy   Plan:  1.) Levaquin 500 mg IV 24h 2.) Monitor renal function   Clydene Fake PharmD Pager #: [phone] 5:49 PM 07/04/2012

## 2012-07-04 NOTE — ED Notes (Signed)
Pt has not been eating or drinking, confused, not acting his normal per wife, pt was found on the floor unsure if he fell or not, fever, pt was also put on the z pack or possible infection of unknown cause said md checked the urine and said it was ok. Pt was seen her for pneumonia a few weeks ago. Alert  to person.

## 2012-07-05 DIAGNOSIS — F29 Unspecified psychosis not due to a substance or known physiological condition: Secondary | ICD-10-CM

## 2012-07-05 LAB — BASIC METABOLIC PANEL
CO2: 25 mEq/L (ref 19–32)
Calcium: 9.8 mg/dL (ref 8.4–10.5)
Chloride: 100 mEq/L (ref 96–112)
Potassium: 3.8 mEq/L (ref 3.5–5.1)
Sodium: 138 mEq/L (ref 135–145)

## 2012-07-05 LAB — CBC
Hemoglobin: 11.6 g/dL — ABNORMAL LOW (ref 13.0–17.0)
MCV: 77.5 fL — ABNORMAL LOW (ref 78.0–100.0)
Platelets: 391 10*3/uL (ref 150–400)
RBC: 4.8 MIL/uL (ref 4.22–5.81)
WBC: 3.4 10*3/uL — ABNORMAL LOW (ref 4.0–10.5)

## 2012-07-05 LAB — HEMOGLOBIN A1C
Hgb A1c MFr Bld: 6.6 % — ABNORMAL HIGH (ref <5.7)
Mean Plasma Glucose: 143 mg/dL — ABNORMAL HIGH (ref <117)

## 2012-07-05 LAB — MAGNESIUM: Magnesium: 2.3 mg/dL (ref 1.5–2.5)

## 2012-07-05 MED ORDER — LEVOFLOXACIN 500 MG PO TABS: 500.0000 mg | ORAL_TABLET | Freq: Every day | ORAL | Status: DC

## 2012-07-05 MED ORDER — LEVOFLOXACIN IN D5W 750 MG/150ML IV SOLN
750.0000 mg | INTRAVENOUS | Status: DC
  Filled 2012-07-05: qty 150

## 2012-07-05 NOTE — Care Management (Signed)
Cm spoke with patient at bedside with daughter present. Per pt spouse Cm to contact spouse for discharge planning. Spouse offered choice of HH agencies in [place]. Per spouse choice [facility] to provide [facility] services upon discharge. Spouse request RW. MD orders entered for [facility] services and DME. Spouse & daughter to assist in home care. Spouse takes pt to respite care 4 x weekly. No other needs stated.   Roxy Manns Kamea Dacosta,RN,BSN [phone]

## 2012-07-05 NOTE — Discharge Summary (Signed)
[facility]                                                                                   [name], is a 78 y.o. male  DOB [birth year]  MRN [id number].  Admission date:  07/04/2012  Discharge Date:  07/05/2012  Primary MD  Gwynneth Aliment, MD  Admitting Physician  Leroy Sea, MD  Admission Diagnosis  Chronic airway obstruction, not elsewhere classified [496] Complications affecting other specified body systems, hypertension [997.91] Dehydration [276.51] Confusion [298.9] Weakness [780.79] URI (upper respiratory infection) [465.9] Dementia [294.20] COPD exacerbation [491.21]  Discharge Diagnosis     Principal Problem:   URI (upper respiratory infection) Active Problems:   HYPERLIPIDEMIA   COPD   HYPERTENSION NEC   PVD (peripheral vascular disease)   Dementia    Past Medical History  Diagnosis Date  . Hyperlipidemia   . PVD (peripheral vascular disease)   . Hypertension   . Dyspnea   . COPD (chronic obstructive pulmonary disease)   . Colon cancer   . Diabetes mellitus   . Alzheimer's dementia 2013  . Pneumonia     Past Surgical History  Procedure Laterality Date  . Colon surgery  1996    cancer  . Spine surgery  2005     Recommendations for primary care physician for things to follow:   Please repeat CBC BMP in 3-4 days. If his dementia continues to worsen might require nursing home placement at a later date.   Discharge Diagnoses:   Principal Problem:   URI (upper respiratory infection) Active Problems:   HYPERLIPIDEMIA   COPD   HYPERTENSION NEC   PVD (peripheral vascular disease)   Dementia    Discharge Condition: Stable   Diet recommendation: See Discharge Instructions below   Consults PT, case manager    History of present illness and  Hospital Course:     Kindly see H&P for history of present illness and admission details, please review complete Labs, Consult reports and Test reports for all  details in brief [name], is a 78 y.o. male,  with history of advanced dementia, COPD, hypertension, P. 80, type 2 diabetes mellitus who is been living at home and doing fairly well however wife had noticed some cough for the last few days along with mild generalized weakness and worsening confusion, he wandered off and slept in the hallway a day before he came to the hospital, presented to the ER for above complaints in the ER workup was suggestive of URI and left sphenoid sinusitis and I was requested to admit the patient. Patient himself had no subjective complaints whatsoever, he was mildly confused pleasantly to, denied any headache chest pain cough phlegm shortness of breath or diarrhea. His EKG, chest x-ray and CK levels were stable, head CT was unremarkable except for sinusitis.   He was placed on Levaquin with good results this morning he is completely symptom free, Again remains pleasantly confused but conversing, denies any headache, no chest abdominal pain, no cough or shortness of breath. He walked in the hall with minimal assistance, PT will evaluate the patient, I will however order a walker along  with home PT and RN to help family D. With his dementia and generalized weakness, case management will see the patient. I have left a detailed message for patient's wife at home.  He has been advised to follow with his PCP on a close basis. We'll request PCP to kindly repeat CBC BMP in 3-4 days.   Will request PCP to please repeat CBC BMP in 3-4 days. If his dementia continues to worsen might require nursing home placement at a later date.      Today   Subjective:   [name] today has no headache,no chest abdominal pain,no new weakness tingling or numbness, feels much better wants to go home today.    Objective:   Blood pressure 175/86, pulse 69, temperature 97.5 F (36.4 C), temperature source Oral, resp. rate 20, height 5\' 11"  (1.803 m), weight 71.986 kg (158 lb 11.2 oz),  SpO2 100.00%.   Intake/Output Summary (Last 24 hours) at 07/05/12 1016 Last data filed at 07/05/12 0900  Gross per 24 hour  Intake    270 ml  Output    450 ml  Net   -180 ml    Exam Awake Alert, Oriented x 1, No new F.N deficits, Normal affect [place].AT,PERRAL Supple Neck,No JVD, No cervical lymphadenopathy appriciated.  Symmetrical Chest wall movement, Good air movement bilaterally, CTAB RRR,No Gallops,Rubs or new Murmurs, No Parasternal Heave +ve B.Sounds, Abd Soft, Non tender, No organomegaly appriciated, No rebound -guarding or rigidity. No Cyanosis, Clubbing or edema, No new Rash or bruise  Data Review   Major procedures and Radiology Reports - PLEASE review detailed and final reports for all details in brief -      Dg Chest 2 View  07/04/2012  *RADIOLOGY REPORT*  Clinical Data: Fever.  Cough.  Generalized weakness.  Current history of COPD, diabetes, and Alzheimer's dementia.  CHEST - 2 VIEW  Comparison: Two-view chest x-ray 06/06/2012, 04/05/2011, 01/09/2010.  Findings: Hyperinflation and emphysematous changes throughout both lungs, unchanged.  Lungs clear.  Bronchovascular markings normal. Pulmonary vascularity normal.  No pneumothorax.  No pleural effusions.  Cardiac silhouette normal in size, unchanged.  Thoracic aorta atherosclerotic, unchanged.  Mildly prominent central pulmonary arteries, unchanged.  IMPRESSION: COPD/emphysema.  No acute cardiopulmonary disease.   Original Report Authenticated By: Hulan Saas, M.D.        Micro Results     No results found for this or any previous visit (from the past 240 hour(s)).   CBC w Diff: Lab Results  Component Value Date   WBC 3.4* 07/05/2012   HGB 11.6* 07/05/2012   HCT 37.2* 07/05/2012   PLT 391 07/05/2012   LYMPHOPCT 21 07/04/2012   MONOPCT 18* 07/04/2012   EOSPCT 0 07/04/2012   BASOPCT 1 07/04/2012    CMP: Lab Results  Component Value Date   NA 138 07/05/2012   K 3.8 07/05/2012   CL 100 07/05/2012   CO2 25  07/05/2012   BUN 15 07/05/2012   CREATININE 0.88 07/05/2012   PROT 7.3 07/04/2012   ALBUMIN 3.5 07/04/2012   BILITOT 0.4 07/04/2012   ALKPHOS 87 07/04/2012   AST 34 07/04/2012   ALT 10 07/04/2012  .   Discharge Instructions     Follow with Primary MD Gwynneth Aliment, MD in 4 days   Get CBC, CMP, checked 4 days by Primary MD and again as instructed by your Primary MD. Get a 2 view Chest X ray done next visit.  Get Medicines reviewed and adjusted.  Please request your  Prim.MD to go over all Hospital Tests and Procedure/Radiological results at the follow up, please get all Hospital records sent to your Prim MD by signing hospital release before you go home.  Activity: As tolerated with Full fall precautions use walker/cane & assistance as needed   Diet:  Heart Healthy Low carb  For Heart failure patients - Check your Weight same time everyday, if you gain over 2 pounds, or you develop in leg swelling, experience more shortness of breath or chest pain, call your Primary MD immediately. Follow Cardiac Low Salt Diet and 1.8 lit/day fluid restriction.  Disposition Home    If you experience worsening of your admission symptoms, develop shortness of breath, life threatening emergency, suicidal or homicidal thoughts you must seek medical attention immediately by calling 911 or calling your MD immediately  if symptoms less severe.  You Must read complete instructions/literature along with all the possible adverse reactions/side effects for all the Medicines you take and that have been prescribed to you. Take any new Medicines after you have completely understood and accpet all the possible adverse reactions/side effects.   Do not drive and provide baby sitting services if your were admitted for syncope or siezures until you have seen by Primary MD or a Neurologist and advised to do so again.  Do not drive when taking Pain medications.    Do not take more than prescribed Pain, Sleep and Anxiety  Medications  Special Instructions: If you have smoked or chewed Tobacco  in the last 2 yrs please stop smoking, stop any regular Alcohol  and or any Recreational drug use.  Wear Seat belts while driving.    Follow-up Information   Follow up with Gwynneth Aliment, MD. Schedule an appointment as soon as possible for a visit in 4 days.   Contact information:   [address] [place] [address] [phone]         Discharge Medications     Medication List    TAKE these medications       albuterol (2.5 MG/3ML) 0.083% nebulizer solution  Commonly known as:  PROVENTIL  Take 3 mLs (2.5 mg total) by nebulization every 4 (four) hours as needed.     aspirin 325 MG tablet  Take 325 mg by mouth daily.     clopidogrel 75 MG tablet  Commonly known as:  PLAVIX  Take 75 mg by mouth daily.     donepezil 5 MG tablet  Commonly known as:  ARICEPT  Take 12 mg by mouth 2 (two) times daily.     levofloxacin 500 MG tablet  Commonly known as:  LEVAQUIN  Take 1 tablet (500 mg total) by mouth daily.     metFORMIN 500 MG tablet  Commonly known as:  GLUCOPHAGE  Take 500 mg by mouth at bedtime.     olmesartan 40 MG tablet  Commonly known as:  BENICAR  Take 40 mg by mouth daily.     rosuvastatin 20 MG tablet  Commonly known as:  CRESTOR  Take 20 mg by mouth daily.     SYMBICORT 160-4.5 MCG/ACT inhaler  Generic drug:  budesonide-formoterol  INHALE TWO PUFFS BY MOUTH TWICE DAILY           Total Time in preparing paper work, data evaluation and todays exam - 35 minutes  Leroy Sea M.D on 07/05/2012 at 10:16 AM  [facility] Office  [phone]

## 2012-07-05 NOTE — Evaluation (Signed)
Physical Therapy Evaluation Patient Details Name: [name] MRN: [id number] DOB: April 18, 1934 Today's Date: 07/05/2012 Time: 4098-1191 PT Time Calculation (min): 12 min  PT Assessment / Plan / Recommendation Clinical Impression  Pt is a 78 year old male with advanced dementia, COPD, hypertension, type 2 diabetes mellitus, dyslipidemia who was diagnosed with a URI by his primary care physician few days ago and was brought in by his wife for gradually worsening generalized weakness along with worsening confusion.  Pt able to ambulate in hallway without assistive device however intermittently used rail for support.  Pt to d/c home today and uncertain if SPC or RW would help or hinder mobility as pt with advanced dementia so will defer to HHPT.  Recommend 24/7 assist for pt safety upon d/c.  Pt reporting pain in "bottom" with ambulation then stated may be due to cramps as he states he feels weaker and hasn't ambulated in a few days.  Will see in acute care if pt remains for strenghtening, assistive device training, and improving independence with mobility.      PT Assessment  Patient needs continued PT services    Follow Up Recommendations  Home health PT;Supervision/Assistance - 24 hour    Does the patient have the potential to tolerate intense rehabilitation      Barriers to Discharge        Equipment Recommendations  Other (comment) (RW vs. SPC TBD by HHPT)    Recommendations for Other Services     Frequency Min 3X/week    Precautions / Restrictions Precautions Precautions: Fall Restrictions Weight Bearing Restrictions: No   Pertinent Vitals/Pain No pain upon leaving room     Mobility  Bed Mobility Bed Mobility: Supine to Sit;Sit to Supine Supine to Sit: HOB elevated;5: Supervision Sit to Supine: HOB elevated;5: Supervision Details for Bed Mobility Assistance: verbal cues for technique Transfers Transfers: Sit to Stand;Stand to Sit Sit to Stand: 5: Supervision;From  bed Stand to Sit: 5: Supervision;To bed Ambulation/Gait Ambulation/Gait Assistance: 4: Min guard Ambulation Distance (Feet): 90 Feet Assistive device: None Ambulation/Gait Assistance Details: pt ambulated without assistive device however halfway started gracing L rail for more support, able to turn 360* with increased time min/guard, ambulated a little further then pt reports increased "bottom" pain and chair brought behind pt, pt also stated very fatigued Gait Pattern: Step-through pattern;Trunk flexed Gait velocity: decreased General Gait Details: slightly unsteady, fatigues quickly, able to improve posture with one verbal cue    Exercises     PT Diagnosis: Difficulty walking;Generalized weakness  PT Problem List: Decreased strength;Decreased activity tolerance;Decreased mobility;Decreased balance;Decreased knowledge of use of DME;Decreased safety awareness PT Treatment Interventions: DME instruction;Gait training;Functional mobility training;Therapeutic activities;Therapeutic exercise;Stair training;Patient/family education;Balance training;Neuromuscular re-education   PT Goals Acute Rehab PT Goals PT Goal Formulation: With patient Time For Goal Achievement: 07/12/12 Potential to Achieve Goals: Good Pt will go Sit to Stand: with supervision PT Goal: Sit to Stand - Progress: Goal set today Pt will go Stand to Sit: with supervision PT Goal: Stand to Sit - Progress: Goal set today Pt will Ambulate: 51 - 150 feet;with supervision;with least restrictive assistive device PT Goal: Ambulate - Progress: Goal set today Pt will Go Up / Down Stairs: 1-2 stairs;with supervision;with rail(s) PT Goal: Up/Down Stairs - Progress: Goal set today Pt will Perform Home Exercise Program: with supervision, verbal cues required/provided PT Goal: Perform Home Exercise Program - Progress: Goal set today  Visit Information  Last PT Received On: 07/05/12 Assistance Needed: +1  Subjective Data   Subjective: I'm not yet there yet but I'm feeling better (not feeling back to normal)   Prior Functioning  Home Living Lives With: Spouse Home Access: Stairs to enter Entrance Stairs-Number of Steps: 2 Home Layout: One level Home Adaptive Equipment: None Prior Function Level of Independence: Independent Communication Communication: No difficulties    Cognition  Cognition Overall Cognitive Status: History of cognitive impairments - at baseline Arousal/Alertness: Awake/alert Behavior During Session: WFL for tasks performed Cognition - Other Comments: advanced dementia    Extremity/Trunk Assessment Right Lower Extremity Assessment RLE ROM/Strength/Tone: Deficits RLE ROM/Strength/Tone Deficits: not able to MMT due to cognition however pt reports increased weakness in LEs during gait Left Lower Extremity Assessment LLE ROM/Strength/Tone: Deficits LLE ROM/Strength/Tone Deficits: not able to MMT due to cognition however pt reports increased weakness in LEs during gait   Balance    End of Session PT - End of Session Activity Tolerance: Patient limited by fatigue Patient left: in bed;with call bell/phone within reach;with bed alarm set  GP     [name],KATHrine E 07/05/2012, 11:19 AM Zenovia Jarred, PT, DPT 07/05/2012 Pager: [phone]

## 2012-07-07 LAB — URINE CULTURE: Colony Count: 4000

## 2012-07-10 LAB — CULTURE, BLOOD (ROUTINE X 2): Culture: NO GROWTH

## 2013-01-24 HISTORY — PX: CATARACT EXTRACTION W/ INTRAOCULAR LENS IMPLANT: SHX1309

## 2013-02-23 HISTORY — PX: CATARACT EXTRACTION: SUR2

## 2013-03-23 ENCOUNTER — Encounter (HOSPITAL_COMMUNITY): Payer: Self-pay | Admitting: Emergency Medicine

## 2013-03-23 ENCOUNTER — Inpatient Hospital Stay (HOSPITAL_COMMUNITY)
Admission: EM | Admit: 2013-03-23 | Discharge: 2013-03-24 | DRG: 313 | Disposition: A | Discharge status: Home / Self Care | Payer: Medicare Other | Attending: Internal Medicine | Admitting: Internal Medicine

## 2013-03-23 ENCOUNTER — Emergency Department (HOSPITAL_COMMUNITY): Payer: Medicare Other

## 2013-03-23 DIAGNOSIS — R0602 Shortness of breath: Secondary | ICD-10-CM

## 2013-03-23 DIAGNOSIS — E1149 Type 2 diabetes mellitus with other diabetic neurological complication: Secondary | ICD-10-CM | POA: Diagnosis present

## 2013-03-23 DIAGNOSIS — R079 Chest pain, unspecified: Secondary | ICD-10-CM | POA: Diagnosis present

## 2013-03-23 DIAGNOSIS — R05 Cough: Secondary | ICD-10-CM

## 2013-03-23 DIAGNOSIS — I739 Peripheral vascular disease, unspecified: Secondary | ICD-10-CM | POA: Diagnosis present

## 2013-03-23 DIAGNOSIS — G309 Alzheimer's disease, unspecified: Secondary | ICD-10-CM | POA: Diagnosis present

## 2013-03-23 DIAGNOSIS — J449 Chronic obstructive pulmonary disease, unspecified: Secondary | ICD-10-CM | POA: Diagnosis present

## 2013-03-23 DIAGNOSIS — F0390 Unspecified dementia without behavioral disturbance: Secondary | ICD-10-CM | POA: Diagnosis present

## 2013-03-23 DIAGNOSIS — Z85038 Personal history of other malignant neoplasm of large intestine: Secondary | ICD-10-CM

## 2013-03-23 DIAGNOSIS — R1110 Vomiting, unspecified: Secondary | ICD-10-CM

## 2013-03-23 DIAGNOSIS — J4489 Other specified chronic obstructive pulmonary disease: Secondary | ICD-10-CM

## 2013-03-23 DIAGNOSIS — R059 Cough, unspecified: Secondary | ICD-10-CM

## 2013-03-23 DIAGNOSIS — I6529 Occlusion and stenosis of unspecified carotid artery: Secondary | ICD-10-CM | POA: Diagnosis present

## 2013-03-23 DIAGNOSIS — R9431 Abnormal electrocardiogram [ECG] [EKG]: Secondary | ICD-10-CM | POA: Diagnosis present

## 2013-03-23 DIAGNOSIS — I10 Essential (primary) hypertension: Secondary | ICD-10-CM | POA: Diagnosis present

## 2013-03-23 DIAGNOSIS — F0280 Dementia in other diseases classified elsewhere without behavioral disturbance: Secondary | ICD-10-CM | POA: Diagnosis present

## 2013-03-23 DIAGNOSIS — I2510 Atherosclerotic heart disease of native coronary artery without angina pectoris: Secondary | ICD-10-CM | POA: Diagnosis present

## 2013-03-23 DIAGNOSIS — IMO0002 Reserved for concepts with insufficient information to code with codable children: Secondary | ICD-10-CM

## 2013-03-23 DIAGNOSIS — Z7902 Long term (current) use of antithrombotics/antiplatelets: Secondary | ICD-10-CM

## 2013-03-23 DIAGNOSIS — Z7982 Long term (current) use of aspirin: Secondary | ICD-10-CM

## 2013-03-23 DIAGNOSIS — J069 Acute upper respiratory infection, unspecified: Secondary | ICD-10-CM

## 2013-03-23 DIAGNOSIS — D509 Iron deficiency anemia, unspecified: Secondary | ICD-10-CM | POA: Diagnosis present

## 2013-03-23 DIAGNOSIS — Z79899 Other long term (current) drug therapy: Secondary | ICD-10-CM

## 2013-03-23 DIAGNOSIS — R0989 Other specified symptoms and signs involving the circulatory and respiratory systems: Secondary | ICD-10-CM | POA: Diagnosis present

## 2013-03-23 DIAGNOSIS — Z87891 Personal history of nicotine dependence: Secondary | ICD-10-CM

## 2013-03-23 DIAGNOSIS — I70219 Atherosclerosis of native arteries of extremities with intermittent claudication, unspecified extremity: Secondary | ICD-10-CM | POA: Diagnosis present

## 2013-03-23 DIAGNOSIS — D72829 Elevated white blood cell count, unspecified: Secondary | ICD-10-CM | POA: Diagnosis present

## 2013-03-23 DIAGNOSIS — M25579 Pain in unspecified ankle and joints of unspecified foot: Secondary | ICD-10-CM | POA: Diagnosis present

## 2013-03-23 DIAGNOSIS — R0789 Other chest pain: Principal | ICD-10-CM | POA: Diagnosis present

## 2013-03-23 DIAGNOSIS — E785 Hyperlipidemia, unspecified: Secondary | ICD-10-CM | POA: Diagnosis present

## 2013-03-23 DIAGNOSIS — E1142 Type 2 diabetes mellitus with diabetic polyneuropathy: Secondary | ICD-10-CM | POA: Diagnosis present

## 2013-03-23 DIAGNOSIS — R112 Nausea with vomiting, unspecified: Secondary | ICD-10-CM | POA: Diagnosis present

## 2013-03-23 LAB — COMPREHENSIVE METABOLIC PANEL
ALT: 7 U/L (ref 0–53)
AST: 20 U/L (ref 0–37)
CO2: 21 mEq/L (ref 19–32)
Calcium: 9.7 mg/dL (ref 8.4–10.5)
Chloride: 104 mEq/L (ref 96–112)
Creatinine, Ser: 0.83 mg/dL (ref 0.50–1.35)
GFR calc Af Amer: >90 mL/min (ref >90)
GFR calc non Af Amer: 83 mL/min — ABNORMAL LOW (ref >90)
Glucose, Bld: 163 mg/dL — ABNORMAL HIGH (ref 70–99)
Total Bilirubin: 0.6 mg/dL (ref 0.3–1.2)

## 2013-03-23 LAB — CBC WITH DIFFERENTIAL/PLATELET
Basophils Relative: 0 % (ref 0–1)
Eosinophils Absolute: 0.2 10*3/uL (ref 0.0–0.7)
Eosinophils Relative: 1 % (ref 0–5)
HCT: 32.9 % — ABNORMAL LOW (ref 39.0–52.0)
Hemoglobin: 9.9 g/dL — ABNORMAL LOW (ref 13.0–17.0)
MCH: 21.8 pg — ABNORMAL LOW (ref 26.0–34.0)
MCHC: 30.1 g/dL (ref 30.0–36.0)
MCV: 72.3 fL — ABNORMAL LOW (ref 78.0–100.0)
Monocytes Absolute: 0.7 10*3/uL (ref 0.1–1.0)
Neutro Abs: 14.8 10*3/uL — ABNORMAL HIGH (ref 1.7–7.7)
RBC: 4.55 MIL/uL (ref 4.22–5.81)

## 2013-03-23 LAB — URINALYSIS, ROUTINE W REFLEX MICROSCOPIC
Glucose, UA: NEGATIVE mg/dL
Hgb urine dipstick: NEGATIVE
Ketones, ur: NEGATIVE mg/dL
Protein, ur: NEGATIVE mg/dL
Urobilinogen, UA: 0.2 mg/dL (ref 0.0–1.0)

## 2013-03-23 LAB — OCCULT BLOOD, POC DEVICE: Fecal Occult Bld: NEGATIVE

## 2013-03-23 LAB — URINE MICROSCOPIC-ADD ON

## 2013-03-23 LAB — HEMOGLOBIN A1C: Hgb A1c MFr Bld: 6.4 % — ABNORMAL HIGH (ref <5.7)

## 2013-03-23 LAB — POCT I-STAT TROPONIN I: Troponin i, poc: 0 ng/mL (ref 0.00–0.08)

## 2013-03-23 MED ORDER — INSULIN ASPART 100 UNIT/ML ~~LOC~~ SOLN
0.0000 [IU] | Freq: Three times a day (TID) | SUBCUTANEOUS | Status: DC
  Administered 2013-03-23: 17:00:00 1 [IU] via SUBCUTANEOUS

## 2013-03-23 MED ORDER — ASPIRIN 81 MG PO CHEW
324.0000 mg | CHEWABLE_TABLET | Freq: Once | ORAL | Status: AC
  Administered 2013-03-23: 324 mg via ORAL
  Filled 2013-03-23: qty 4

## 2013-03-23 MED ORDER — ACETAMINOPHEN 325 MG PO TABS: 650.0000 mg | ORAL_TABLET | ORAL | Status: DC | PRN

## 2013-03-23 MED ORDER — ATORVASTATIN CALCIUM 40 MG PO TABS
40.0000 mg | ORAL_TABLET | Freq: Every day | ORAL | Status: DC
  Administered 2013-03-23: 40 mg via ORAL
  Filled 2013-03-23 (×2): qty 1

## 2013-03-23 MED ORDER — HEPARIN SODIUM (PORCINE) 5000 UNIT/ML IJ SOLN
5000.0000 [IU] | Freq: Three times a day (TID) | INTRAMUSCULAR | Status: DC
  Administered 2013-03-23 – 2013-03-24 (×4): 5000 [IU] via SUBCUTANEOUS
  Filled 2013-03-23 (×7): qty 1

## 2013-03-23 MED ORDER — ALBUTEROL SULFATE (2.5 MG/3ML) 0.083% IN NEBU
2.5000 mg | INHALATION_SOLUTION | RESPIRATORY_TRACT | Status: DC | PRN
  Filled 2013-03-23: qty 3

## 2013-03-23 MED ORDER — IRBESARTAN 300 MG PO TABS
300.0000 mg | ORAL_TABLET | Freq: Every day | ORAL | Status: DC
  Administered 2013-03-23 – 2013-03-24 (×2): 300 mg via ORAL
  Filled 2013-03-23 (×2): qty 1

## 2013-03-23 MED ORDER — ONDANSETRON HCL 4 MG/2ML IJ SOLN: 4.0000 mg | Freq: Four times a day (QID) | INTRAMUSCULAR | Status: DC | PRN

## 2013-03-23 MED ORDER — GABAPENTIN 100 MG PO CAPS
100.0000 mg | ORAL_CAPSULE | Freq: Two times a day (BID) | ORAL | Status: DC
  Administered 2013-03-23 – 2013-03-24 (×3): 100 mg via ORAL
  Filled 2013-03-23 (×4): qty 1

## 2013-03-23 MED ORDER — METFORMIN HCL 500 MG PO TABS
500.0000 mg | ORAL_TABLET | Freq: Every day | ORAL | Status: DC
  Administered 2013-03-23: 500 mg via ORAL
  Filled 2013-03-23 (×2): qty 1

## 2013-03-23 MED ORDER — CLOPIDOGREL BISULFATE 75 MG PO TABS
75.0000 mg | ORAL_TABLET | Freq: Every day | ORAL | Status: DC
  Administered 2013-03-23 – 2013-03-24 (×2): 75 mg via ORAL
  Filled 2013-03-23 (×3): qty 1

## 2013-03-23 MED ORDER — ASPIRIN 325 MG PO TABS
325.0000 mg | ORAL_TABLET | Freq: Every day | ORAL | Status: DC
  Administered 2013-03-23 – 2013-03-24 (×2): 325 mg via ORAL
  Filled 2013-03-23 (×2): qty 1

## 2013-03-23 MED ORDER — BIOTENE DRY MOUTH MT LIQD
15.0000 mL | Freq: Two times a day (BID) | OROMUCOSAL | Status: DC
  Administered 2013-03-23 – 2013-03-24 (×3): 15 mL via OROMUCOSAL

## 2013-03-23 MED ORDER — BUDESONIDE-FORMOTEROL FUMARATE 160-4.5 MCG/ACT IN AERO
2.0000 | INHALATION_SPRAY | Freq: Two times a day (BID) | RESPIRATORY_TRACT | Status: DC
  Administered 2013-03-23 – 2013-03-24 (×2): 2 via RESPIRATORY_TRACT
  Filled 2013-03-23: qty 6

## 2013-03-23 NOTE — ED Notes (Signed)
Family at bedside. 

## 2013-03-23 NOTE — H&P (Addendum)
[facility] History and Physical  GUTHRIE LEMME NWG:[id number] DOB: July 02, 1934 DOA: 03/23/2013  Referring physician: EDP PCP: Gwynneth Aliment, MD   Chief Complaint: Chest pain, emesis   HPI: [name] is a 78 y.o. male who presents to the ED after and episode of vomiting at home followed by 5-10 mins of severe chest pain.  Unfortunately the patient has severe dementia at baseline, and dosent remember the episode at all (states he feels fine and has no complaints now).  History is taken from the patients wife.  All symptoms resolved spontaneously without intervention PTA to the ED.  Review of Systems: Systems reviewed.  As above, otherwise negative.  Not really effective however given the patients dementia.  Past Medical History  Diagnosis Date  . Hyperlipidemia   . PVD (peripheral vascular disease)   . Hypertension   . Dyspnea   . COPD (chronic obstructive pulmonary disease)   . Colon cancer   . Diabetes mellitus   . Alzheimer's dementia 2013  . Pneumonia    Past Surgical History  Procedure Laterality Date  . Colon surgery  1996    cancer  . Spine surgery  2005  . Vascular surgery     Social History:  reports that he quit smoking about 20 years ago. His smoking use included Cigarettes. He has a 12 pack-year smoking history. He has never used smokeless tobacco. He reports that he does not drink alcohol or use illicit drugs.  No Known Allergies  Family History  Problem Relation Age of Onset  . Stroke Mother 24  . Hypertension Mother   . Cancer Sister   . Heart disease Brother     Heart Disease before age 98  . Heart attack Brother   . Cancer Sister   . Cancer Brother      Prior to Admission medications   Medication Sig Start Date End Date Taking? Authorizing Provider  albuterol (PROVENTIL) (2.5 MG/3ML) 0.083% nebulizer solution Take 3 mLs (2.5 mg total) by nebulization every 4 (four) hours as needed. 06/02/12  Yes Nyoka Cowden, MD  aspirin 325 MG tablet  Take 325 mg by mouth daily.   Yes Historical Provider, MD  clopidogrel (PLAVIX) 75 MG tablet Take 75 mg by mouth daily.  04/04/12  Yes Historical Provider, MD  metFORMIN (GLUCOPHAGE) 500 MG tablet Take 500 mg by mouth at bedtime.   Yes Historical Provider, MD  olmesartan (BENICAR) 40 MG tablet Take 40 mg by mouth daily.   Yes Historical Provider, MD  rosuvastatin (CRESTOR) 20 MG tablet Take 20 mg by mouth daily.   Yes Historical Provider, MD  SYMBICORT 160-4.5 MCG/ACT inhaler INHALE TWO PUFFS BY MOUTH TWICE DAILY 06/09/12  Yes Nyoka Cowden, MD   Physical Exam: Filed Vitals:   03/23/13 0502  BP: 143/48  Pulse: 89  Temp:   Resp: 18    BP 143/48  Pulse 89  Temp(Src) 98 F (36.7 C) (Oral)  Resp 18  SpO2 98%  General Appearance:    Alert, pleasantly confused, no distress, appears stated age  Head:    Normocephalic, atraumatic  Eyes:    PERRL, EOMI, sclera non-icteric        Nose:   Nares without drainage or epistaxis. Mucosa, turbinates normal  Throat:   Moist mucous membranes. Oropharynx without erythema or exudate.  Neck:   Supple. No carotid bruits.  No thyromegaly.  No lymphadenopathy.   Back:     No CVA tenderness, no spinal tenderness  Lungs:     Clear to auscultation bilaterally, without wheezes, rhonchi or rales  Chest wall:    No tenderness to palpitation  Heart:    Regular rate and rhythm without murmurs, gallops, rubs  Abdomen:     Soft, non-tender, nondistended, normal bowel sounds, no organomegaly  Genitalia:    deferred  Rectal:    deferred  Extremities:   No clubbing, cyanosis or edema.  Pulses:   2+ and symmetric all extremities  Skin:   Skin color, texture, turgor normal, no rashes or lesions  Lymph nodes:   Cervical, supraclavicular, and axillary nodes normal  Neurologic:   CNII-XII intact. Normal strength, sensation and reflexes      throughout    Labs on Admission:  Basic Metabolic Panel:  Recent Labs Lab 03/23/13 0240  NA 139  K 4.8  CL 104  CO2  21  GLUCOSE 163*  BUN 23  CREATININE 0.83  CALCIUM 9.7   Liver Function Tests:  Recent Labs Lab 03/23/13 0240  AST 20  ALT 7  ALKPHOS 78  BILITOT 0.6  PROT 7.5  ALBUMIN 3.8    Recent Labs Lab 03/23/13 0240  LIPASE 26   No results found for this basename: AMMONIA,  in the last 168 hours CBC:  Recent Labs Lab 03/23/13 0240  WBC 16.4*  NEUTROABS 14.8*  HGB 9.9*  HCT 32.9*  MCV 72.3*  PLT 304   Cardiac Enzymes: No results found for this basename: CKTOTAL, CKMB, CKMBINDEX, TROPONINI,  in the last 168 hours  BNP (last 3 results) No results found for this basename: PROBNP,  in the last 8760 hours CBG: No results found for this basename: GLUCAP,  in the last 168 hours  Radiological Exams on Admission: Dg Chest 2 View  03/23/2013   CLINICAL DATA:  Chest pain.  Week.  Vomiting.  EXAM: CHEST  2 VIEW  COMPARISON:  07/04/2012  FINDINGS: The heart size and mediastinal contours are within normal limits. Emphysematous changes in the lungs. No focal airspace disease or consolidation. . The visualized skeletal structures are unremarkable.  IMPRESSION: No active cardiopulmonary disease.   Electronically Signed   By: Burman Nieves M.D.   On: 03/23/2013 02:53   Dg Abd 2 Views  03/23/2013   CLINICAL DATA:  Chest pain, weakness, vomiting.  EXAM: ABDOMEN - 2 VIEW  COMPARISON:  None.  FINDINGS: The bowel gas pattern is normal. There is no evidence of free air. No radio-opaque calculi or other significant radiographic abnormality is seen. Postoperative changes in the lower lumbar spine and pelvis. Degenerative changes in the spine and hips. Vascular calcifications.  IMPRESSION: Negative.   Electronically Signed   By: Burman Nieves M.D.   On: 03/23/2013 02:52    EKG: Independently reviewed.  Assessment/Plan Active Problems:   Chest pain   1. Chest pain - patient's Heart score is a 7 at best.  The patient, has never had a cardiac work up nor seen a cardiologist, but has an  almost perfect setup for CAD: he has T wave inversions and ST segment depressions (old) in his inferior and lateral leads, a long history of vascular disease including PAD requiring surgery in his legs.  Likely will require cardiology evaluation and stress test vs cath.  2d echo and serial trops ordered in the mean time.  DDX includes GI causes.    Code Status: Full  Family Communication: Wife at bedside Disposition Plan: Admit to inpatient   Time spent: 70 min  Kilo Eshelman,  Kordell Jafri M. [facility] Pager [phone]  If 7AM-7PM, please contact the day team taking care of the patient [email or web address] Password [facility] 03/23/2013, 5:54 AM

## 2013-03-23 NOTE — ED Provider Notes (Signed)
CSN: [id number]     Arrival date & time 03/23/13  0023 History   First MD Initiated Contact with Patient 03/23/13 0126     Chief Complaint  Patient presents with  . Chest Pain  . Weakness  . Emesis   (Consider location/radiation/quality/duration/timing/severity/associated sxs/prior Treatment) HPI Comments: 78 year old male with a history of "stage IV Alzheimers" presents with multiple episodes of vomiting. The wife and son brought the patient in a provided history. Otherwise history is not obtainable from the patient due to his dementia. Per the family the patient was found by the wife with multiple areas of emesis around him. He then describes 5-10 minutes of clutching his chest and having chest pain. After this he also stated he had some abdominal pain. They've not noticed any diarrhea or constipation. He was weak and seemed to have pain on his legs when he is standing. All the symptoms seemed to resolve prior to arrival to the ER.   Past Medical History  Diagnosis Date  . Hyperlipidemia   . PVD (peripheral vascular disease)   . Hypertension   . Dyspnea   . COPD (chronic obstructive pulmonary disease)   . Colon cancer   . Diabetes mellitus   . Alzheimer's dementia 2013  . Pneumonia    Past Surgical History  Procedure Laterality Date  . Colon surgery  1996    cancer  . Spine surgery  2005  . Vascular surgery     Family History  Problem Relation Age of Onset  . Stroke Mother 64  . Hypertension Mother   . Cancer Sister   . Heart disease Brother     Heart Disease before age 71  . Heart attack Brother   . Cancer Sister   . Cancer Brother    History  Substance Use Topics  . Smoking status: Former Smoker -- 0.30 packs/day for 40 years    Types: Cigarettes    Quit date: 03/26/1993  . Smokeless tobacco: Never Used  . Alcohol Use: No    Review of Systems  Unable to perform ROS: Dementia  Cardiovascular: Positive for chest pain.  Gastrointestinal: Positive for vomiting.   Neurological: Positive for weakness.    Allergies  Review of patient's allergies indicates no known allergies.  Home Medications   Current Outpatient Rx  Name  Route  Sig  Dispense  Refill  . albuterol (PROVENTIL) (2.5 MG/3ML) 0.083% nebulizer solution   Nebulization   Take 3 mLs (2.5 mg total) by nebulization every 4 (four) hours as needed.   75 mL   3   . aspirin 325 MG tablet   Oral   Take 325 mg by mouth daily.         . clopidogrel (PLAVIX) 75 MG tablet   Oral   Take 75 mg by mouth daily.          . metFORMIN (GLUCOPHAGE) 500 MG tablet   Oral   Take 500 mg by mouth at bedtime.         Marland Kitchen olmesartan (BENICAR) 40 MG tablet   Oral   Take 40 mg by mouth daily.         . rosuvastatin (CRESTOR) 20 MG tablet   Oral   Take 20 mg by mouth daily.         . SYMBICORT 160-4.5 MCG/ACT inhaler      INHALE TWO PUFFS BY MOUTH TWICE DAILY   1 Inhaler   11    BP 112/58  Pulse 84  Temp(Src) 98 F (36.7 C) (Oral)  Resp 19  SpO2 100% Physical Exam  Nursing note and vitals reviewed. Constitutional: He appears well-developed and well-nourished.  HENT:  Head: Normocephalic and atraumatic.  Right Ear: External ear normal.  Left Ear: External ear normal.  Nose: Nose normal.  Eyes: Right eye exhibits no discharge. Left eye exhibits no discharge.  Neck: Neck supple.  Cardiovascular: Normal rate, regular rhythm, normal heart sounds and intact distal pulses.   Pulmonary/Chest: Effort normal.  Abdominal: Soft. There is no tenderness.  Musculoskeletal: He exhibits no edema.  Neurological: He is alert. He has normal strength. He is disoriented. No cranial nerve deficit or sensory deficit. GCS eye subscore is 4. GCS verbal subscore is 4. GCS motor subscore is 6.  5 out of 5 strength in all 4 extremities  Skin: Skin is warm and dry.    ED Course  Procedures (including critical care time) Labs Review Labs Reviewed  CBC WITH DIFFERENTIAL - Abnormal; Notable for the  following:    WBC 16.4 (*)    Hemoglobin 9.9 (*)    HCT 32.9 (*)    MCV 72.3 (*)    MCH 21.8 (*)    RDW 18.0 (*)    Neutrophils Relative % 91 (*)    Lymphocytes Relative 4 (*)    Neutro Abs 14.8 (*)    All other components within normal limits  COMPREHENSIVE METABOLIC PANEL - Abnormal; Notable for the following:    Glucose, Bld 163 (*)    GFR calc non Af Amer 83 (*)    All other components within normal limits  URINALYSIS, ROUTINE W REFLEX MICROSCOPIC - Abnormal; Notable for the following:    Leukocytes, UA TRACE (*)    All other components within normal limits  URINE MICROSCOPIC-ADD ON - Abnormal; Notable for the following:    Squamous Epithelial / LPF FEW (*)    All other components within normal limits  LIPASE, BLOOD  POCT I-STAT TROPONIN I   Imaging Review Dg Chest 2 View  03/23/2013   CLINICAL DATA:  Chest pain.  Week.  Vomiting.  EXAM: CHEST  2 VIEW  COMPARISON:  07/04/2012  FINDINGS: The heart size and mediastinal contours are within normal limits. Emphysematous changes in the lungs. No focal airspace disease or consolidation. . The visualized skeletal structures are unremarkable.  IMPRESSION: No active cardiopulmonary disease.   Electronically Signed   By: Burman Nieves M.D.   On: 03/23/2013 02:53   Dg Abd 2 Views  03/23/2013   CLINICAL DATA:  Chest pain, weakness, vomiting.  EXAM: ABDOMEN - 2 VIEW  COMPARISON:  None.  FINDINGS: The bowel gas pattern is normal. There is no evidence of free air. No radio-opaque calculi or other significant radiographic abnormality is seen. Postoperative changes in the lower lumbar spine and pelvis. Degenerative changes in the spine and hips. Vascular calcifications.  IMPRESSION: Negative.   Electronically Signed   By: Burman Nieves M.D.   On: 03/23/2013 02:52    EKG Interpretation    Date/Time:  Monday March 23 2013 00:36:26 EST Ventricular Rate:  84 PR Interval:  123 QRS Duration: 82 QT Interval:  385 QTC Calculation: 455 R  Axis:   59 Text Interpretation:  Sinus rhythm Repol abnrm suggests ischemia, anterolateral Confirmed by Wilhelmina Hark  MD, Andrew Soria ([id number]) on 03/23/2013 1:48:19 AM            MDM   1. Vomiting   2. Chest pain    Patient is well-appearing  here. He has no complaints. It seems that he looked ill according to the wife after the vomiting and was clutching his chest. Rectal exam negative for bleeding. Given his abnormal EKG which has slight changes from before, I recommended admission for observation and serial enzymes. Dr. [name] Reil of the hospitalist will admit.    Audree Camel, MD 03/23/13 [phone]

## 2013-03-23 NOTE — Progress Notes (Signed)
Attempted three times to obtain flu PCR swab.  Patient is refusing and states " I can't stand to have something up my nose."  Dr. Arbutus Leas notified.

## 2013-03-23 NOTE — Progress Notes (Signed)
Echocardiogram 2D Echocardiogram has been performed.  [name] 03/23/2013, 9:25 AM

## 2013-03-23 NOTE — Care Management Note (Addendum)
    Page 1 of 2   03/24/2013     12:58:06 PM   CARE MANAGEMENT NOTE 03/24/2013  Patient:  [name], [name]   Account Number:  [id number]  Date Initiated:  03/23/2013  Documentation initiated by:  Lanier Clam  Subjective/Objective Assessment:   78 Y/O M ADMITTED W/CHEST PAIN.ZO:XWRU,EA,VWU,[id number].     Action/Plan:   FROM HOME W/SPOUSE.   Anticipated DC Date:  03/24/2013   Anticipated DC Plan:  HOME W HOME HEALTH SERVICES      DC Planning Services  CM consult      Choice offered to / List presented to:  C-1 Patient   DME arranged  Levan Hurst      DME agency  [facility]     HH arranged  HH-2 PT      [facility] agency  [facility]   Status of service:  Completed, signed off Medicare Important Message given?   (If response is "NO", the following Medicare IM given date fields will be blank) Date Medicare IM given:   Date Additional Medicare IM given:    Discharge Disposition:  HOME W HOME HEALTH SERVICES  Per UR Regulation:  Reviewed for med. necessity/level of care/duration of stay  If discussed at Long Length of Stay Meetings, dates discussed:    Comments:  03/24/13 Rahi Chandonnet RN,BSN NCM [phone] [facility] CHOSEN FOR HHPT,KRISTEN REP AWARE OF D/C & HHPT ORDER,ALSO ORDERED FOR ROLLATOR-RW W/4 WHEELS,& SEAT.  03/23/13 Smt Lokey RN,BSN NCM [phone]

## 2013-03-23 NOTE — Consult Note (Signed)
CARDIOLOGY CONSULT NOTE  Patient ID: [name] MRN: [id number] DOB/AGE: June 14, 1934 78 y.o.  Admit date: 03/23/2013 Referring Physician  Algis Downs, PA-C Primary Physician:  Gwynneth Aliment, MD Reason for Consultation  Chest pain and abnormal EKG  HPI: [name] is a 78 year old African American male with history of severe peripheral arterial disease and noncritical coronary artery disease by cardiac catheterization in 2009 admitted with new chest pain. Brought in by his wife to the hospital.   Patient has slowed down significantly due to dementia and is pretty much become homebound. He denies PND or orthopnea or chest pain. He does have claudication in his lower extremities but due to inactivity he has not had any pain in his legs. Patient is presently having his dinner at the bedside and states that he's doing well and does not recollect why he is in a hospital. 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  He also C/O chronic dyspnea and fatigue. No change in symptoms except worsening dementia and continued stable claudication. No ulcers or bluish discoloration or rest pain.   Past Medical History  Diagnosis Date  . Hyperlipidemia   . PVD (peripheral vascular disease)   . Hypertension   . Dyspnea   . COPD (chronic obstructive pulmonary disease)   . Colon cancer   . Diabetes mellitus   . Alzheimer's dementia 2013  . Pneumonia      Past Surgical History  Procedure Laterality Date  . Colon surgery  1996    cancer  . Spine surgery  2005  . Vascular surgery       Family History  Problem Relation Age of Onset  . Stroke Mother 73  . Hypertension Mother   . Cancer Sister   . Heart disease Brother     Heart Disease before age 65  . Heart attack Brother   . Cancer Sister   . Cancer Brother      Social History: History   Social History  . Marital Status: Married    Spouse Name: N/A    Number of Children: N/A  . Years  of Education: N/A   Occupational History  . Not on file.   Social History Main Topics  . Smoking status: Former Smoker -- 0.30 packs/day for 40 years    Types: Cigarettes    Quit date: 03/26/1993  . Smokeless tobacco: Never Used  . Alcohol Use: No  . Drug Use: No  . Sexual Activity: Not Currently   Other Topics Concern  . Not on file   Social History Narrative  . No narrative on file     Prescriptions prior to admission  Medication Sig Dispense Refill  . albuterol (PROVENTIL) (2.5 MG/3ML) 0.083% nebulizer solution Take 3 mLs (2.5 mg total) by nebulization every 4 (four) hours as needed.  75 mL  3  . aspirin 325 MG tablet Take 325 mg by mouth daily.      . clopidogrel (PLAVIX) 75 MG tablet Take 75 mg by mouth daily.       . metFORMIN (GLUCOPHAGE) 500 MG tablet Take 500 mg by mouth at bedtime.      Marland Kitchen olmesartan (BENICAR) 40 MG tablet Take 40 mg by mouth daily.      . rosuvastatin (CRESTOR) 20 MG tablet Take 20 mg by mouth daily.      . SYMBICORT 160-4.5 MCG/ACT inhaler INHALE TWO PUFFS BY MOUTH TWICE DAILY  1 Inhaler  11  Scheduled Meds: . antiseptic oral rinse  15 mL Mouth Rinse BID  . aspirin  325 mg Oral Daily  . atorvastatin  40 mg Oral q1800  . budesonide-formoterol  2 puff Inhalation BID  . clopidogrel  75 mg Oral Daily  . gabapentin  100 mg Oral BID  . heparin  5,000 Units Subcutaneous Q8H  . insulin aspart  0-9 Units Subcutaneous TID WC  . irbesartan  300 mg Oral Daily  . metFORMIN  500 mg Oral QHS   Continuous Infusions:  PRN Meds:.acetaminophen, albuterol, ondansetron (ZOFRAN) IV  ROS: General: no fevers/chills/night sweats Eyes: no blurry vision, diplopia, or amaurosis ENT: no sore throat or hearing loss Resp: no cough, wheezing, or hemoptysis CV: no edema or palpitations GI: no abdominal pain, nausea, vomiting, diarrhea, or constipation GU: no dysuria, frequency, or hematuria Skin: no rash Neuro: no headache, numbness, tingling, or weakness of  extremities Musculoskeletal: no joint pain or swelling Heme: no bleeding, DVT, or easy bruising Endo: no polydipsia or polyuria    Physical Exam: Blood pressure 141/60, pulse 86, temperature 97.7 F (36.5 C), temperature source Axillary, resp. rate 18, height 6' (1.829 m), weight 75.8 kg (167 lb 1.7 oz), SpO2 100.00%.   Well build and normal body habitus who is in no acute distress. Appears stated age. Alert Ox3.   There is no cyanosis. HEENT: normal limits. PERRLA, No JVD.   CARDIAC EXAM: S1 S2 normal, no gallop or murmur.   CHEST EXAM: No tenderness of chest wall. LUNGS: Clear to percuss and auscultate. No rales or ronchi.   ABDOMEN: No hepatosplenomegaly. BS normal in all 4 quadrants. Abdomen is non-tender.   EXTREMITY: Full range of movementes, No edema. MUSCULOSKELETAL EXAM: Intact with full range of motion in all 4 extremities.   NEUROLOGIC EXAM: Grossly intact without any focal deficits. Alert O x 2. Responds appropriately, does not recollect when he is.   VASCULAR EXAM: No skin breakdown. Carotids bilateral soft bruit. Extremities: Femoral pulse right 2 plus with bruit and left not felt. Popliteal pulse absent bilateral ; Pedal pulse absent bilateral. No prominent pulse felt in the abdomen. No varicose veins.  Labs:   Lab Results  Component Value Date   WBC 16.4* 03/23/2013   HGB 9.9* 03/23/2013   HCT 32.9* 03/23/2013   MCV 72.3* 03/23/2013   PLT 304 03/23/2013    Recent Labs Lab 03/23/13 0240  NA 139  K 4.8  CL 104  CO2 21  BUN 23  CREATININE 0.83  CALCIUM 9.7  PROT 7.5  BILITOT 0.6  ALKPHOS 78  ALT 7  AST 20  GLUCOSE 163*   Lab Results  Component Value Date   CKTOTAL 172 07/04/2012   TROPONINI <0.30 03/23/2013    EKG: 03/23/2013: Normal sinus rhythm, Normal axis, inferior and anterolateral ST segment depression with T wave inversion, consistent with ischemia.  Compared to prior EKG lateral ST segment changes and T-wave inversion is  new.   Radiology: Dg Chest 2 View  03/23/2013   CLINICAL DATA:  Chest pain.  Week.  Vomiting.  EXAM: CHEST  2 VIEW  COMPARISON:  07/04/2012  FINDINGS: The heart size and mediastinal contours are within normal limits. Emphysematous changes in the lungs. No focal airspace disease or consolidation. . The visualized skeletal structures are unremarkable.  IMPRESSION: No active cardiopulmonary disease.   Electronically Signed   By: Burman Nieves M.D.   On: 03/23/2013 02:53   Dg Abd 2 Views  03/23/2013   CLINICAL DATA:  Chest pain, weakness, vomiting.  EXAM: ABDOMEN - 2 VIEW  COMPARISON:  None.  FINDINGS: The bowel gas pattern is normal. There is no evidence of free air. No radio-opaque calculi or other significant radiographic abnormality is seen. Postoperative changes in the lower lumbar spine and pelvis. Degenerative changes in the spine and hips. Vascular calcifications.  IMPRESSION: Negative.   Electronically Signed   By: Burman Nieves M.D.   On: 03/23/2013 02:52   Scheduled Meds: . antiseptic oral rinse  15 mL Mouth Rinse BID  . aspirin  325 mg Oral Daily  . atorvastatin  40 mg Oral q1800  . budesonide-formoterol  2 puff Inhalation BID  . clopidogrel  75 mg Oral Daily  . gabapentin  100 mg Oral BID  . heparin  5,000 Units Subcutaneous Q8H  . insulin aspart  0-9 Units Subcutaneous TID WC  . irbesartan  300 mg Oral Daily  . metFORMIN  500 mg Oral QHS   Continuous Infusions:  PRN Meds:.acetaminophen, albuterol, ondansetron (ZOFRAN) IV  ASSESSMENT AND PLAN:   1. Chest pain, atypical however cannot exclude unstable angina pectoris with new EKG changes. 2.  Abnormal EKG 3. Other dyspnea and respiratory abnormality. Shortness of breath/Dyspnea on exertion. Due to deconditioning. Anginal equivalent probably less likely.  Echo 06/19/11: Mild LV systolic dysfunction EF 45-50%. Inferior and inf lat hypokinesis. Poor echo window.  Lexiscan stress 06/21/11: Inferior wall scar. No ischemia. EF  43%. Low risk.  Heart cath 05/20/07: Mid LAD 30% stenosis. Normal LVEF.  4. Atherosclerosis of native arteries of the extremities with intermittent claudication (440.21)  5. Carotid bruit   Carotid duplex 10/23/12: 1. Moderate stenosis of the bilateral distal internal carotid artery and mid internal carotid artery (>50% stenosis in the range of 50-69%). Mild stenosis of the bilateral proximal internal carotid artery and bulb (<50% stenosis in the range of 16-49%). The bilateral vessel geometry is tortuous.Moderate bilateral intimal thickening. 2. Follow up in six months is appropriate if clinically indicated. Compared to the study done on 04/24/2012, left ICA stenosis has progressed.  6. Dementia Moderate  Recommendation: Patient essentially asymptomatic and appears to be clinically stable, EKG does reveal new ST segment changes in the lateral leads, would recommend continued observation, and excluding myocardial injury by serial cardiac markers. Unless the cardiac markers are positive or patient has recurrent chest discomfort that he complains about while being observed in the hospital, I would recommend conservative therapy with continue medications which he is on presently. Would save invasive strategy only if he is clearly symptomatic or the cardiac markers show evidence of myocardial injury.  Pamella Pert, MD 03/23/2013, 6:09 PM [facility] Pager: [phone] Office: [phone] If no answer Cell [phone]

## 2013-03-23 NOTE — ED Notes (Signed)
Per wife pt was in the bathroom, vomit x1 then developed chest pain and weakness to both legs, was unable to ambulate by himself, pt denies CP at this time

## 2013-03-23 NOTE — ED Notes (Signed)
Pt has advanced Alzheimer disease and is a poor historian.  Family is at bedside.

## 2013-03-23 NOTE — Progress Notes (Addendum)
[facility] PROGRESS NOTE  [name] ZOX:[id number] DOB: [birth year] DOA: 03/23/2013 PCP: Gwynneth Aliment, MD  Assessment/Plan:  78 yo male with history of PAD, DM, COPD, HTN, HLD, Pleasantly demented but lives at home with wife.  Have a large episode of vomitus last night followed by approximately 10 minutes of severe chest pain.  Further the patient had pain in his feet so severe he could not stand.  His wife checked his BP and found it to be elevated 178 / 70.  She called her son and they brought him to the Emergency Department.  POC T1 were WNL, but EKG was abnormal.  Chest pain. Resolved. Abnormal EKG Patient is on Aspirin & Plavix for PAD ? Unstable angina.  Concerned due to patient's risk factors.  Have requested Cardiology to evaluate him.  Dr. Jacinto Halim.  Vomiting Single episode.  ?Viral vs something he ate. Appears resolved.  Supportive care. Patient's daughter has been ill. Wife requests Influenza PCR  Bilateral Foot pain. On exam toes 2,3 on right and toe 5 on left have significant pain with palpation.  Not erythematous or bruised. Diabetic neuropathy Will start low dose gabapentin. Request PT evaluation as patient was unable to walk.  Leukocytosis Uncertain etiology Likely infection given elevation in neutrophils Urinalysis - negative Chest xray - negative. AM cbc.  Microcytic Anemia Baseline hgb 11.6 Admission Hgb 9.9 guiac is negative. Likely stable for outpatient work up.  DM Continue Metformin Check Hgb A1C Add SSI-S   DVT Prophylaxis:  Heparin  Code Status: full Family Communication: Wife at bedside Disposition Plan: PT to eval.  Cardiology to eval.  Likely home when appropriate.   Consultants:  Cardiology  Procedures:    Antibiotics:    HPI/Subjective: Patient is pleasantly demented "I'm doing fine"  Objective: Filed Vitals:   03/23/13 0633 03/23/13 0647 03/23/13 0950 03/23/13 1402  BP:  160/77 151/66 141/60  Pulse:   85  86  Temp: 99.4 F (37.4 C) 98.7 F (37.1 C) 97.9 F (36.6 C) 97.7 F (36.5 C)  TempSrc: Oral Oral Axillary   Resp:  16 18 18   Height:  6' (1.829 m)    Weight:  75.8 kg (167 lb 1.7 oz)    SpO2:  100% 99% 100%    Intake/Output Summary (Last 24 hours) at 03/23/13 1406 Last data filed at 03/23/13 1300  Gross per 24 hour  Intake    120 ml  Output      0 ml  Net    120 ml   Filed Weights   03/23/13 0647  Weight: 75.8 kg (167 lb 1.7 oz)    Exam: General: Well developed, well nourished, NAD, appears stated age.  Awake, alert, not a reliable historian. HEENT:  PERR, EOMI, Anicteic Sclera, MMM. No pharyngeal erythema or exudates  Neck: Supple, no JVD, no masses  Cardiovascular: RRR, S1 S2 auscultated, no rubs, murmurs or gallops.   Respiratory: Clear to auscultation bilaterally with equal chest rise  Abdomen: Soft, nontender, nondistended, + bowel sounds  Extremities: warm dry without cyanosis clubbing or edema. Pain to palpation in toes (as mentioned above) Neuro: AAOx3, cranial nerves grossly intact. Strength 5/5 in upper and lower extremities.  Decreased sensation in heels bilaterally. Skin: Without rashes exudates or nodules.     Data Reviewed: Basic Metabolic Panel:  Recent Labs Lab 03/23/13 0240  NA 139  K 4.8  CL 104  CO2 21  GLUCOSE 163*  BUN 23  CREATININE 0.83  CALCIUM 9.7  Liver Function Tests:  Recent Labs Lab 03/23/13 0240  AST 20  ALT 7  ALKPHOS 78  BILITOT 0.6  PROT 7.5  ALBUMIN 3.8    Recent Labs Lab 03/23/13 0240  LIPASE 26   CBC:  Recent Labs Lab 03/23/13 0240  WBC 16.4*  NEUTROABS 14.8*  HGB 9.9*  HCT 32.9*  MCV 72.3*  PLT 304     Studies: Dg Chest 2 View  03/23/2013   CLINICAL DATA:  Chest pain.  Week.  Vomiting.  EXAM: CHEST  2 VIEW  COMPARISON:  07/04/2012  FINDINGS: The heart size and mediastinal contours are within normal limits. Emphysematous changes in the lungs. No focal airspace disease or consolidation. . The  visualized skeletal structures are unremarkable.  IMPRESSION: No active cardiopulmonary disease.   Electronically Signed   By: Burman Nieves M.D.   On: 03/23/2013 02:53   Dg Abd 2 Views  03/23/2013   CLINICAL DATA:  Chest pain, weakness, vomiting.  EXAM: ABDOMEN - 2 VIEW  COMPARISON:  None.  FINDINGS: The bowel gas pattern is normal. There is no evidence of free air. No radio-opaque calculi or other significant radiographic abnormality is seen. Postoperative changes in the lower lumbar spine and pelvis. Degenerative changes in the spine and hips. Vascular calcifications.  IMPRESSION: Negative.   Electronically Signed   By: Burman Nieves M.D.   On: 03/23/2013 02:52    Scheduled Meds: . antiseptic oral rinse  15 mL Mouth Rinse BID  . aspirin  325 mg Oral Daily  . atorvastatin  40 mg Oral q1800  . budesonide-formoterol  2 puff Inhalation BID  . clopidogrel  75 mg Oral Daily  . gabapentin  100 mg Oral BID  . heparin  5,000 Units Subcutaneous Q8H  . insulin aspart  0-9 Units Subcutaneous TID WC  . irbesartan  300 mg Oral Daily  . metFORMIN  500 mg Oral QHS   Continuous Infusions:   Active Problems:   HYPERLIPIDEMIA   HYPERTENSION NEC   PVD (peripheral vascular disease)   Dementia   Chest pain   Nausea with vomiting   Pain in joint, ankle and foot    Conley Canal  [facility] Pager [phone]. If 7PM-7AM, please contact night-coverage at [email or web address], password [facility] 03/23/2013, 2:06 PM  LOS: 0 days

## 2013-03-24 DIAGNOSIS — I739 Peripheral vascular disease, unspecified: Secondary | ICD-10-CM

## 2013-03-24 DIAGNOSIS — F0390 Unspecified dementia without behavioral disturbance: Secondary | ICD-10-CM

## 2013-03-24 LAB — GLUCOSE, CAPILLARY
Glucose-Capillary: 104 mg/dL — ABNORMAL HIGH (ref 70–99)
Glucose-Capillary: 103 mg/dL — ABNORMAL HIGH (ref 70–99)

## 2013-03-24 LAB — BASIC METABOLIC PANEL
BUN: 20 mg/dL (ref 6–23)
CO2: 25 mEq/L (ref 19–32)
Calcium: 8.9 mg/dL (ref 8.4–10.5)
Chloride: 104 mEq/L (ref 96–112)
Creatinine, Ser: 0.88 mg/dL (ref 0.50–1.35)
Sodium: 140 mEq/L (ref 137–147)

## 2013-03-24 LAB — CBC
HCT: 29.9 % — ABNORMAL LOW (ref 39.0–52.0)
MCH: 21.4 pg — ABNORMAL LOW (ref 26.0–34.0)
MCHC: 29.8 g/dL — ABNORMAL LOW (ref 30.0–36.0)
MCV: 72 fL — ABNORMAL LOW (ref 78.0–100.0)
Platelets: 305 10*3/uL (ref 150–400)
RBC: 4.15 MIL/uL — ABNORMAL LOW (ref 4.22–5.81)
WBC: 5 10*3/uL (ref 4.0–10.5)

## 2013-03-24 MED ORDER — GABAPENTIN 100 MG PO CAPS: 100.0000 mg | ORAL_CAPSULE | Freq: Two times a day (BID) | ORAL | Status: DC

## 2013-03-24 NOTE — Evaluation (Addendum)
Physical Therapy Evaluation Patient Details Name: [name] MRN: [id number] DOB: [birth year] Today's Date: 03/24/2013 Time: 0865-7846 PT Time Calculation (min): 23 min  PT Assessment / Plan / Recommendation History of Present Illness  78 yo male admitted with chest pain. Hx of Alz, PVD, COPD, colon cancer.   Clinical Impression  On eval, pt required Min assist for mobility-able to ambulate ~150 feet in hallway. Pt unsteady at times and held onto rail to stabilize intermittently. Pt also c/o bil foot pain during ambulation-so distance limited. Wife arrived at end of session. Explained to her that pt will require 24/7 assist/supervision for safety-close guard/hands on when pt is ambulating. Wife states she is able to provide current level of care. Encouraged her to follow up with PCP for 4 wheeled walker prescription (wife mentioned this to therapist during session)-also mentioned this to in house care manager.  Recommend HHPT and home health aide.     PT Assessment  Patient needs continued PT services    Follow Up Recommendations  Home health PT;Supervision/Assistance - 24 hour; Home health aide    Does the patient have the potential to tolerate intense rehabilitation      Barriers to Discharge        Equipment Recommendations  Other (comment) (wife states PCP recommended pt use 4 wheeled walker for longer distances. Encouraged wife to follow up with MD  to get this ordered for pt)    Recommendations for Other Services OT consult   Frequency Min 3X/week    Precautions / Restrictions Precautions Precautions: Fall Restrictions Weight Bearing Restrictions: No   Pertinent Vitals/Pain Feet-pt unable to rate. Gait appeared mildly antalgic at times.       Mobility  Bed Mobility Bed Mobility: Supine to Sit;Sit to Supine Supine to Sit: 6: Modified independent (Device/Increase time) Sit to Supine: 6: Modified independent (Device/Increase time) Transfers Transfers: Sit to  Stand;Stand to Sit Sit to Stand: 5: Supervision;From bed Stand to Sit: 6: Modified independent (Device/Increase time);To bed Details for Transfer Assistance: VCs safety. x 3 for hygiene-loss of bowels.  Ambulation/Gait Ambulation/Gait Assistance: 4: Min assist Ambulation Distance (Feet): 150 Feet Assistive device: None Ambulation/Gait Assistance Details: Pt held onto handrail intermittently. Unsteady at times. Pt c/o pain bilaterally in feet. slow gait speed.  Gait Pattern: Step-through pattern;wide BOS    Exercises     PT Diagnosis: Altered mental status;Generalized weakness;Abnormality of gait;Difficulty walking  PT Problem List: Decreased strength;Decreased activity tolerance;Decreased balance;Decreased mobility;Decreased safety awareness;Decreased cognition PT Treatment Interventions: Gait training;Functional mobility training;Therapeutic activities;Therapeutic exercise;Balance training;Patient/family education     PT Goals(Current goals can be found in the care plan section) Acute Rehab PT Goals Patient Stated Goal: wife states plan is for home.  PT Goal Formulation: With family Time For Goal Achievement: 04/07/13 Potential to Achieve Goals: Good  Visit Information  Last PT Received On: 03/24/13 Assistance Needed: +1 History of Present Illness: 78 yo male admitted with chest pain. Hx of Alz, PVD, COPD, colon cancer.        Prior Functioning  Home Living Family/patient expects to be discharged to:: Private residence Living Arrangements: Spouse/significant other Available Help at Discharge: Family Type of Home: House Home Access: Stairs to enter Secretary/administrator of Steps: 4 Entrance Stairs-Rails: Right Home Layout: One level Home Equipment: Bedside commode;Wheelchair - manual Additional Comments: WC borrowed from church Prior Function Level of Independence: Needs assistance ADL's / Homemaking Assistance Needed: aide comes in to bathe/dress  patient Communication Communication: No difficulties    Cognition  Cognition Arousal/Alertness: Awake/alert Behavior During Therapy: WFL for tasks assessed/performed Overall Cognitive Status: History of cognitive impairments - at baseline (Hx of Alz) Memory: Decreased short-term memory    Extremity/Trunk Assessment Upper Extremity Assessment Upper Extremity Assessment: Overall WFL for tasks assessed Lower Extremity Assessment Lower Extremity Assessment: Generalized weakness Cervical / Trunk Assessment Cervical / Trunk Assessment: Normal   Balance Balance Balance Assessed: Yes Static Standing Balance Static Standing - Balance Support: Right upper extremity supported Static Standing - Level of Assistance: 5: Stand by assistance Static Standing - Comment/# of Minutes: Pt held onto armrest of chair at times while therapist performed toilet hygiene. No LOB.  Dynamic Standing Balance Dynamic Standing - Balance Support: No upper extremity supported;Right upper extremity supported Dynamic Standing - Level of Assistance: 4: Min assist  End of Session PT - End of Session Equipment Utilized During Treatment: Gait belt Activity Tolerance: Patient tolerated treatment well Patient left: in bed;with call bell/phone within reach;with family/visitor present  GP     [name] Alert, MPT Pager: [phone]

## 2013-03-24 NOTE — Progress Notes (Signed)
Received report from previous nurse, agree with her assessment and will continue to monitor. 

## 2013-03-24 NOTE — Progress Notes (Signed)
Two attempts to obtain flu PCR but patient is not cooperating.  I even tried to get him to swab his nose himself and he rubbed it everyone on his face other than his nose.

## 2013-03-24 NOTE — Discharge Summary (Signed)
Physician Discharge Summary  [name] XBJ:[id number] DOB: 02-[name]-1934 DOA: 03/23/2013  PCP: Gwynneth Aliment, MD  Admit date: 03/23/2013 Discharge date: 03/24/2013  Recommendations for Outpatient Follow-up:  1. Pt will need to follow up with PCP in 2 weeks post discharge 2. Please obtain BMP to evaluate electrolytes and kidney function 3. Please also check CBC to evaluate Hg and Hct levels   Discharge Diagnoses:  Active Problems:   HYPERLIPIDEMIA   HYPERTENSION NEC   PVD (peripheral vascular disease)   Dementia   Chest pain   Nausea with vomiting   Pain in joint, ankle and foot  atypical Chest pain.  Resolved.  Abnormal EKG--seen by cardiology--Dr. Jacinto Halim during hospitalization -Dr. Jacinto Halim recommended conservative therapy unless he had abnormal cardiac markers or became symptomatic again -Patient is on Aspirin & Plavix for PAD  -The patient remained hemodynamically stable and asymptomatic throughout the hospitalization. -troponins neg x 2, iSTAT troponin neg x 2 Vomiting  Single episode. ?Viral vs something he ate.  Appears resolved. Supportive care.  -The patient's diet was advanced and he tolerated it without any further vomiting Patient's daughter has been ill.  Wife requests Influenza PCR--however, the patient refused testing x2 Bilateral Foot pain.  On exam toes 2,3 on right and toe 5 on left have significant pain with palpation. Not erythematous or bruised nor open wounds likelyDiabetic neuropathy  Will start low dose gabapentin.  -On the day of discharge, the patient denied any further foot pain Request PT evaluation as patient was unable to walk.  Leukocytosis  -Likely reactive from the patient's acute medical condition -Patient remained afebrile and hemodynamically stable -Improved without any antibiotics or anti-infectives -He remained afebrile and hemodynamically stable -WBC 5.0 on the day of discharge Urinalysis - negative  Chest xray - negative.  AM cbc.   Microcytic Anemia  Baseline hgb 11.6  Admission Hgb 9.9  guiac is negative.  stable for outpatient work up.  DM  Continue Metformin  Check Hgb A1C--6.4  Add SSI-S during the hospitalization Prior pertinent testing Echo 06/19/11: Mild LV systolic dysfunction EF 45-50%. Inferior and inf lat hypokinesis. Poor echo window.  Lexiscan stress 06/21/11: Inferior wall scar. No ischemia. EF 43%. Low risk.  Heart cath 05/20/07: Mid LAD 30% stenosis. Normal LVEF.  - Atherosclerosis of native arteries of the extremities with intermittent claudication  Carotid duplex 10/23/12:  1. Moderate stenosis of the bilateral distal internal carotid artery and mid internal carotid artery (>50% stenosis in the range of 50-69%). Mild stenosis of the bilateral proximal internal carotid artery and bulb (<50% stenosis in the range of 16-49%). The bilateral vessel geometry is tortuous.Moderate bilateral intimal thickening.  2. Follow up in six months is appropriate if clinically indicated. Compared to the study done on 04/24/2012, left ICA stenosis has progressed.  Discharge Condition: Stable  Disposition:  discharge home  Diet: Heart healthy Wt Readings from Last 3 Encounters:  03/23/13 75.8 kg (167 lb 1.7 oz)  07/05/12 71.986 kg (158 lb 11.2 oz)  04/21/12 80.74 kg (178 lb)    History of present illness:  78 y.o. male with a history of nonobstructive CAD, PVD, diabetes mellitus, hypertension, COPD, and dementia who presents to the ED after and episode of vomiting at home followed by 5-10 mins of severe chest pain. The patient was not able to provide any significant history because of his advanced dementia. EKG showed T wave inversions II, III, avF, V3-V6. During admission, troponins were negative x2. I-STAT troponins were also negative x2. The  patient was seen by cardiology, Dr. Jacinto Halim. He recommended continued observation to exclude myocardial injury. He did not feel that the patient needed any invasive testing unless  he became more symptomatic or the cardiac markers showed any evidence of myocardial injury. The patient denied any further chest discomfort or shortness of breath during the hospitalization. Dr. Jacinto Halim recommended conservative therapy and continued antiplatelet therapy. The patient's diet was advanced. He was able to eat well without vomiting. He did not have any further diarrhea. Influenza PCR was attempted x2, but the patient was not cooperative and refused testing. He remained in couple isolation throughout the hospitalization, but he remained afebrile and hemodynamically stable. The patient was discharged in stable condition.     Consultants: cardiology  Discharge Exam: Filed Vitals:   03/24/13 0500  BP: 146/63  Pulse: 80  Temp: 97.8 F (36.6 C)  Resp: 18   Filed Vitals:   03/23/13 1402 03/23/13 2142 03/24/13 0500 03/24/13 0802  BP: 141/60 132/60 146/63   Pulse: 86 79 80   Temp: 97.7 F (36.5 C) 97.4 F (36.3 C) 97.8 F (36.6 C)   TempSrc:  Axillary Oral   Resp: 18 18 18    Height:      Weight:      SpO2: 100% 100% 100% 100%   General: A&O x 3, NAD, pleasant, cooperative Cardiovascular: RRR, no rub, no gallop, no S3 Respiratory: CTAB, no wheeze, no rhonchi Abdomen:soft, nontender, nondistended, positive bowel sounds Extremities: No edema, No lymphangitis, no petechiae  Discharge Instructions      Discharge Orders   Future Appointments Provider Department Dept Phone   04/21/2013 3:00 PM [facility] [address] [phone]   04/21/2013 3:40 PM Carma Lair Nickel, NP [facility] [phone]   Future Orders Complete By Expires   Diet - low sodium heart healthy  As directed    Increase activity slowly  As directed        Medication List         albuterol (2.5 MG/3ML) 0.083% nebulizer solution  Commonly known as:  PROVENTIL  Take 3 mLs (2.5 mg total) by nebulization every 4 (four) hours as needed.      aspirin 325 MG tablet  Take 325 mg by mouth daily.     clopidogrel 75 MG tablet  Commonly known as:  PLAVIX  Take 75 mg by mouth daily.     gabapentin 100 MG capsule  Commonly known as:  NEURONTIN  Take 1 capsule (100 mg total) by mouth 2 (two) times daily.     metFORMIN 500 MG tablet  Commonly known as:  GLUCOPHAGE  Take 500 mg by mouth at bedtime.     olmesartan 40 MG tablet  Commonly known as:  BENICAR  Take 40 mg by mouth daily.     rosuvastatin 20 MG tablet  Commonly known as:  CRESTOR  Take 20 mg by mouth daily.     SYMBICORT 160-4.5 MCG/ACT inhaler  Generic drug:  budesonide-formoterol  INHALE TWO PUFFS BY MOUTH TWICE DAILY         The results of significant diagnostics from this hospitalization (including imaging, microbiology, ancillary and laboratory) are listed below for reference.    Significant Diagnostic Studies: Dg Chest 2 View  03/23/2013   CLINICAL DATA:  Chest pain.  Week.  Vomiting.  EXAM: CHEST  2 VIEW  COMPARISON:  07/04/2012  FINDINGS: The heart size and mediastinal contours are within normal limits. Emphysematous changes in  the lungs. No focal airspace disease or consolidation. . The visualized skeletal structures are unremarkable.  IMPRESSION: No active cardiopulmonary disease.   Electronically Signed   By: Burman Nieves M.D.   On: 03/23/2013 02:53   Dg Abd 2 Views  03/23/2013   CLINICAL DATA:  Chest pain, weakness, vomiting.  EXAM: ABDOMEN - 2 VIEW  COMPARISON:  None.  FINDINGS: The bowel gas pattern is normal. There is no evidence of free air. No radio-opaque calculi or other significant radiographic abnormality is seen. Postoperative changes in the lower lumbar spine and pelvis. Degenerative changes in the spine and hips. Vascular calcifications.  IMPRESSION: Negative.   Electronically Signed   By: Burman Nieves M.D.   On: 03/23/2013 02:52     Microbiology: No results found for this or any previous visit (from the past 240 hour(s)).    Labs: Basic Metabolic Panel:  Recent Labs Lab 03/23/13 0240 03/24/13 0513  NA 139 140  K 4.8 4.1  CL 104 104  CO2 21 25  GLUCOSE 163* 111*  BUN 23 20  CREATININE 0.83 0.88  CALCIUM 9.7 8.9   Liver Function Tests:  Recent Labs Lab 03/23/13 0240  AST 20  ALT 7  ALKPHOS 78  BILITOT 0.6  PROT 7.5  ALBUMIN 3.8    Recent Labs Lab 03/23/13 0240  LIPASE 26   No results found for this basename: AMMONIA,  in the last 168 hours CBC:  Recent Labs Lab 03/23/13 0240 03/24/13 0513  WBC 16.4* 5.0  NEUTROABS 14.8*  --   HGB 9.9* 8.9*  HCT 32.9* 29.9*  MCV 72.3* 72.0*  PLT 304 305   Cardiac Enzymes:  Recent Labs Lab 03/23/13 1405 03/24/13 0902  TROPONINI <0.30 <0.30   BNP: No components found with this basename: POCBNP,  CBG:  Recent Labs Lab 03/23/13 1638 03/23/13 2002 03/24/13 0740 03/24/13 1155  GLUCAP 124* 124* 104* 103*    Time coordinating discharge:  Greater than 30 minutes  Signed:  Meghanne Pletz, DO [facility] Pager: [phone] 03/24/2013, 12:41 PM

## 2013-03-27 MED ORDER — UNABLE TO FIND: Status: DC

## 2013-04-17 ENCOUNTER — Emergency Department (HOSPITAL_COMMUNITY)
Admission: EM | Admit: 2013-04-17 | Discharge: 2013-04-17 | Disposition: A | Discharge status: Home / Self Care | Payer: Medicare HMO | Attending: Emergency Medicine | Admitting: Emergency Medicine

## 2013-04-17 ENCOUNTER — Encounter (HOSPITAL_COMMUNITY): Payer: Self-pay | Admitting: Emergency Medicine

## 2013-04-17 DIAGNOSIS — E785 Hyperlipidemia, unspecified: Secondary | ICD-10-CM | POA: Insufficient documentation

## 2013-04-17 DIAGNOSIS — Z85038 Personal history of other malignant neoplasm of large intestine: Secondary | ICD-10-CM | POA: Insufficient documentation

## 2013-04-17 DIAGNOSIS — Z79899 Other long term (current) drug therapy: Secondary | ICD-10-CM | POA: Insufficient documentation

## 2013-04-17 DIAGNOSIS — Z7902 Long term (current) use of antithrombotics/antiplatelets: Secondary | ICD-10-CM | POA: Insufficient documentation

## 2013-04-17 DIAGNOSIS — I739 Peripheral vascular disease, unspecified: Secondary | ICD-10-CM | POA: Insufficient documentation

## 2013-04-17 DIAGNOSIS — Z8701 Personal history of pneumonia (recurrent): Secondary | ICD-10-CM | POA: Insufficient documentation

## 2013-04-17 DIAGNOSIS — F0280 Dementia in other diseases classified elsewhere without behavioral disturbance: Secondary | ICD-10-CM | POA: Insufficient documentation

## 2013-04-17 DIAGNOSIS — E119 Type 2 diabetes mellitus without complications: Secondary | ICD-10-CM | POA: Insufficient documentation

## 2013-04-17 DIAGNOSIS — J449 Chronic obstructive pulmonary disease, unspecified: Secondary | ICD-10-CM | POA: Insufficient documentation

## 2013-04-17 DIAGNOSIS — Z7982 Long term (current) use of aspirin: Secondary | ICD-10-CM | POA: Insufficient documentation

## 2013-04-17 DIAGNOSIS — I998 Other disorder of circulatory system: Secondary | ICD-10-CM | POA: Insufficient documentation

## 2013-04-17 DIAGNOSIS — I70229 Atherosclerosis of native arteries of extremities with rest pain, unspecified extremity: Secondary | ICD-10-CM

## 2013-04-17 DIAGNOSIS — IMO0002 Reserved for concepts with insufficient information to code with codable children: Secondary | ICD-10-CM | POA: Insufficient documentation

## 2013-04-17 DIAGNOSIS — I10 Essential (primary) hypertension: Secondary | ICD-10-CM | POA: Insufficient documentation

## 2013-04-17 DIAGNOSIS — Z9889 Other specified postprocedural states: Secondary | ICD-10-CM | POA: Insufficient documentation

## 2013-04-17 DIAGNOSIS — Z87891 Personal history of nicotine dependence: Secondary | ICD-10-CM | POA: Insufficient documentation

## 2013-04-17 DIAGNOSIS — J4489 Other specified chronic obstructive pulmonary disease: Secondary | ICD-10-CM | POA: Insufficient documentation

## 2013-04-17 DIAGNOSIS — G309 Alzheimer's disease, unspecified: Secondary | ICD-10-CM | POA: Insufficient documentation

## 2013-04-17 LAB — BASIC METABOLIC PANEL
BUN: 16 mg/dL (ref 6–23)
CALCIUM: 9.2 mg/dL (ref 8.4–10.5)
CO2: 24 mEq/L (ref 19–32)
CREATININE: 0.85 mg/dL (ref 0.50–1.35)
Chloride: 103 mEq/L (ref 96–112)
GFR calc Af Amer: >90 mL/min (ref >90)
GFR, EST NON AFRICAN AMERICAN: 83 mL/min — AB (ref >90)
Glucose, Bld: 121 mg/dL — ABNORMAL HIGH (ref 70–99)
Potassium: 4.4 mEq/L (ref 3.7–5.3)
SODIUM: 140 meq/L (ref 137–147)

## 2013-04-17 LAB — CBC WITH DIFFERENTIAL/PLATELET
Basophils Absolute: 0.1 10*3/uL (ref 0.0–0.1)
Basophils Relative: 1 % (ref 0–1)
EOS ABS: 0.7 10*3/uL (ref 0.0–0.7)
Eosinophils Relative: 9 % — ABNORMAL HIGH (ref 0–5)
HCT: 29.4 % — ABNORMAL LOW (ref 39.0–52.0)
Hemoglobin: 8.9 g/dL — ABNORMAL LOW (ref 13.0–17.0)
LYMPHS ABS: 1.8 10*3/uL (ref 0.7–4.0)
Lymphocytes Relative: 24 % (ref 12–46)
MCH: 21.5 pg — AB (ref 26.0–34.0)
MCHC: 30.3 g/dL (ref 30.0–36.0)
MCV: 71.2 fL — AB (ref 78.0–100.0)
MONO ABS: 0.7 10*3/uL (ref 0.1–1.0)
Monocytes Relative: 10 % (ref 3–12)
Neutro Abs: 4.1 10*3/uL (ref 1.7–7.7)
Neutrophils Relative %: 56 % (ref 43–77)
PLATELETS: 323 10*3/uL (ref 150–400)
RBC: 4.13 MIL/uL — ABNORMAL LOW (ref 4.22–5.81)
RDW: 18.1 % — AB (ref 11.5–15.5)
WBC: 7.4 10*3/uL (ref 4.0–10.5)

## 2013-04-17 LAB — SAMPLE TO BLOOD BANK

## 2013-04-17 LAB — PROTIME-INR
INR: 0.9 (ref 0.00–1.49)
Prothrombin Time: 12 seconds (ref 11.6–15.2)

## 2013-04-17 MED ORDER — OXYCODONE-ACETAMINOPHEN 5-325 MG PO TABS: 1.0000 | ORAL_TABLET | Freq: Four times a day (QID) | ORAL | Status: DC | PRN

## 2013-04-17 MED ORDER — OXYCODONE-ACETAMINOPHEN 5-325 MG PO TABS
1.0000 | ORAL_TABLET | Freq: Once | ORAL | Status: AC
  Administered 2013-04-17: 1 via ORAL
  Filled 2013-04-17: qty 1

## 2013-04-17 NOTE — Discharge Instructions (Signed)
Bypass Surgery, Arteries of the Legs Care After Please read the instructions outlined below. Refer to these instructions for the next few weeks. These discharge instructions provide you with general information on caring for yourself after surgery. Your caregiver may also give you specific instructions. While your treatment has been planned according to the most current medical practices available, unavoidable complications occasionally occur. If you have any problems or questions after discharge, please call your caregiver. HOME CARE INSTRUCTIONS   It will be normal to be sore for a couple weeks following surgery. See your caregiver if this seems to be getting worse rather than better.  Only take over-the-counter or prescription medicines for pain, discomfort, or fever as directed by your caregiver.  Use showers for bathing, until seen or as instructed. Pat incision sites dry after shower, do not rub.  Elevate your legs when sitting down and avoid standing for prolonged periods of time. This will help with pain and swelling.  Change dressings if necessary or as directed.  You may resume normal diet and activities as directed or allowed.  Avoid lifting or driving until you are instructed otherwise. Never drive while taking pain medicine.  Make an appointment to see your caregiver for stitches (suture) or staple removal when instructed. SEEK MEDICAL CARE IF:   You have increased bleeding from the wound.  You see redness, swelling, or have increasing pain in the wound.  You have pus coming from your wound.  You have an oral temperature above 102 F (38.9 C).  You have a bad smell coming from the wound or dressing.  Your leg becomes numb or tingles. SEEK IMMEDIATE MEDICAL CARE IF:   You develop a rash.  You have difficulty breathing, feel short of breath, or develop chest pain.  You develop lightheadedness or feel faint.  You develop any reaction or side effects to medications  given.  You develop pain or swelling in your leg or your leg turns cold and blue. Document Released: 03/12/2005 Document Revised: 06/04/2011 Document Reviewed: 07/29/2007 [facility] Patient Information 2014 [place].  Acute Ischemic Limb Acute ischemic limb develops when an arm or leg (limb) has trouble getting enough blood. This happens when an artery (a large blood vessel that carries oxygen and nutrients that feed your body) is partially or totally blocked. It is also called critical limb ischemia. The problem affects the legs more often than the arms. It is described as acute when the condition comes on suddenly. When this happens, it is considered a medical emergency. CAUSES  An ischemic limb almost always is the result of underlying atherosclerotic disease (when artery walls become thick and hard). It develops because of the buildup of fatty material that hardens (plaque) in arteries and veins. This buildup narrows the blood vessels and keeps tissues in the limb from getting enough blood. Small amounts of plaque can break off from the walls of the blood vessels and create a clot. This blocks the blood flow and causes ischemic limb. Other factors that can make ischemic limb more likely are:  Smoking.  High blood pressure.  Diabetes.  High cholesterol levels.  Abnormal heart rhythms (for example, a common one called "atrial fibrillation")  Past operations on the heart and/or blood vessels.  Past problems with blood clots.  Past injury (such as burns or a broken bone) that damaged blood vessels in the limb. Other conditions can cause ischemic limb, although these are rare. They include:  Buerger's disease, caused by inflamed blood vessels in  the hands and feet. This is also called thromboangiitis obliterans.  Some forms of arthritis.  Rare birth defects affecting the arteries of the legs. SYMPTOMS  The major signs and symptoms of ischemic limb are:   Pain in the leg while  resting (also called "rest pain"). This is sometimes described as a burning pain in the foot that gets worse when lying down.  The leg or arm is cool to the touch.  The leg or arm lacks color.  The leg or arm will not move.  There is numbness, a tingling or a prickling sensation in the limb.  Blood flow or pulse cannot be found by your caregiver in the arm or leg. DIAGNOSIS  In diagnosing ischemic limb a caregiver considers:   The person's description of how the leg or arm has been feeling.  The results of the caregiver's own examination.  Blood tests. PROGNOSIS  With timely emergency treatment, there is a greater than 50-50 chance that a caregiver can fix or remove the block in the artery that is causing the ischemic limb. This could require medication and, possibly, an operation. If medical care is not gotten quickly enough, the limb might need to be surgically removed (amputated). TREATMENT  Possible treatments for ischemic limb include:  Medications to break down blood clots and let blood flow more easily. These medicines must be given directly into a blood vessel and requires staying the hospital.  Revascularization, which is surgery to improve blood flow by putting in new blood vessels.  Angioplasty, which is surgery that places a small balloon in an artery to open blood flow.  Surgery to place a stent (a flexible metal tube) in an artery to keep it open.  Amputation, which is surgical removal of some or all of the affected limb. RELATED COMPLICATIONS People who are treated for acute ischemic limb are more likely to:  Have more-than-normal bleeding caused by blood-thinning medications.  Have ongoing pain.  Develop an infection in the arm or leg. If surgery is needed, possible complications could include:  A possible need for blood transfusions.  Infection.  Swelling inside the limb that squeezes the blood vessels. This is called "Compartment Syndrome", a  condition that requires more surgery.  A long recovery time.  Loss of mobility.  A need for amputation.  The need for more surgery. Document Released: 12/20/2007 Document Revised: 06/04/2011 Document Reviewed: 12/20/2007 [facility] Patient Information 2014 [place].

## 2013-04-17 NOTE — Consult Note (Signed)
Patient name: [name] MRN: [id number] DOB: 03-18-1935 Sex: male   Referred by: Tawnya Crook  Reason for referral:  Chief Complaint  Patient presents with  . no pulse rt leg     HISTORY OF PRESENT ILLNESS: The patient presents to the emergency room physician with rest pain in his lower extremity. This is worse on the right than on the left. He has a very complex past history. He does have a prior history of femoral to femoral bypass and left femoral to above-knee popliteal bypass with Gore-Tex graft. He was last seen in our office by Dr. Kellie Simmering approximately one year ago. At that time he had included his left femoropopliteal bypass which was present since 2003. He was noted to have 2-3+ femoral pulses bilaterally. He was not having any limiting claudication or rest pain and therefore was to be seen again in approximately this time this year. He did have an appointment for [place] in one week. He has had difficulty over the last several weeks with a recent admission for vomiting and some exacerbation of COPD. Does have a history of colon cancer. He has had no evidence of recurrence for 17 years. He does have dementia but does live at home. Over the past several lites he has had a worsening rest pain. His wife reports this is been present for approximately one week. He was taking Tylenol with Codeine with minimal relief. He was having some relief with Advil. He is chronically on Plavix last dose yesterday.  Past Medical History  Diagnosis Date  . Hyperlipidemia   . PVD (peripheral vascular disease)   . Hypertension   . Dyspnea   . COPD (chronic obstructive pulmonary disease)   . Colon cancer   . Diabetes mellitus   . Alzheimer's dementia 2013  . Pneumonia     Past Surgical History  Procedure Laterality Date  . Colon surgery  1996    cancer  . Spine surgery  2005  . Vascular surgery      History   Social History  . Marital Status: Married    Spouse Name: N/A    Number of  Children: N/A  . Years of Education: N/A   Occupational History  . Not on file.   Social History Main Topics  . Smoking status: Former Smoker -- 0.30 packs/day for 40 years    Types: Cigarettes    Quit date: 03/26/1993  . Smokeless tobacco: Never Used  . Alcohol Use: No  . Drug Use: No  . Sexual Activity: Not Currently   Other Topics Concern  . Not on file   Social History Narrative  . No narrative on file    Family History  Problem Relation Age of Onset  . Stroke Mother 22  . Hypertension Mother   . Cancer Sister   . Heart disease Brother     Heart Disease before age 1  . Heart attack Brother   . Cancer Sister   . Cancer Brother     Allergies as of 04/17/2013  . (No Known Allergies)    No current facility-administered medications on file prior to encounter.   Current Outpatient Prescriptions on File Prior to Encounter  Medication Sig Dispense Refill  . aspirin 325 MG tablet Take 325 mg by mouth daily.      . clopidogrel (PLAVIX) 75 MG tablet Take 75 mg by mouth daily.       Marland Kitchen gabapentin (NEURONTIN) 100 MG capsule Take 1 capsule (  100 mg total) by mouth 2 (two) times daily.  60 capsule  0  . metFORMIN (GLUCOPHAGE) 500 MG tablet Take 500 mg by mouth at bedtime.      Marland Kitchen olmesartan (BENICAR) 40 MG tablet Take 40 mg by mouth daily.      . rosuvastatin (CRESTOR) 20 MG tablet Take 20 mg by mouth at bedtime.          REVIEW OF SYSTEMS:  Review of systems all negative except for those listed in the past medical history  PHYSICAL EXAMINATION:  General: The patient is a well-nourished male, in no acute distress. Comfortable. A leak and he Vital signs are BP 169/74  Pulse 81  Temp(Src) 98.3 F (36.8 C)  Resp 18  Ht 5\' 11"  (1.803 m)  Wt 168 lb (76.204 kg)  BMI 23.44 kg/m2  SpO2 100% Pulmonary: There is a good air exchange  Abdomen: Soft and non-tender no masses noted. Fem-fem bypass occluded physical exam Musculoskeletal: There are no major deformities.     Neurologic: No focal weakness or paresthesias are detected, Skin: Does have some superficial blistering on the tips of several toes on the right foot. Nothing on the left. Psychiatric: The patient has normal affect. He is confused. He does carry on a conversation. Cardiovascular: 2+ radial pulses bilaterally. Absent femoral popliteal and pedal pulses Does have dorsalis pedis Doppler flow on the left foot and posterior tibial Doppler flow on the right foot. Both feet are warm and he has no tenderness in his calves.   Impression and Plan:  Rest pain right greater than left. Clearly has had progression of his occlusive disease. Next this does this at length with the patient and his wife present and also with their son. He does have motor and sensory function intact. I have recommended that we proceed with arteriogram on Monday morning with probable need for access fem-fem bypass. I explained that he has clearly lost inflow and this is why is having worsening symptoms. Fortunately has a creatinine was totally normal on his recent admission. He did not have any critical coronary disease but was to see his cardiologist back in several weeks. I explained to the wife that this really is not an elective with him having a rest pain. He will be discharged to home with Percocet 1-2 every 4 hours. I gave to the patient's wife my contact information that he is having difficulty with pain control over the weekend we will admit him for pain control. Plan is for outpatient arteriogram on Monday and surgery to follow.    Kohen Reither [facility] Office: [phone]

## 2013-04-17 NOTE — ED Notes (Signed)
The pt was sent from his doctors office with no pulse in his rt foot and leg with blue toes.  The pt has had rt leg pain since Sunday and just got an appointment today.  The pt has dementia

## 2013-04-17 NOTE — ED Provider Notes (Signed)
CSN: [id number]     Arrival date & time 04/17/13  1717 History   First MD Initiated Contact with Patient 04/17/13 1756     Chief Complaint  Patient presents with  . no pulse rt leg    (Consider location/radiation/quality/duration/timing/severity/associated sxs/prior Treatment) Patient is a 79 y.o. male presenting with leg pain.  Leg Pain Location:  Foot and toe Foot location:  L foot, R foot, sole of L foot, sole of R foot, dorsum of L foot and dorsum of R foot Toe pain location: all, R>L. Pain details:    Quality:  Dull and aching   Radiates to:  Does not radiate   Severity:  Moderate   Onset quality:  Gradual   Duration:  1 week   Timing:  Constant   Progression:  Worsening Chronicity:  New Dislocation: no   Foreign body present:  No foreign bodies Tetanus status:  Unknown Prior injury to area:  No Relieved by:  Nothing (advil) Worsened by:  Activity and bearing weight Ineffective treatments:  None tried Associated symptoms: no back pain, no decreased ROM, no fatigue, no fever, no muscle weakness, no numbness, no stiffness, no swelling and no tingling   Risk factors comment:  PVD   Past Medical History  Diagnosis Date  . Hyperlipidemia   . PVD (peripheral vascular disease)   . Hypertension   . Dyspnea   . COPD (chronic obstructive pulmonary disease)   . Colon cancer   . Diabetes mellitus   . Alzheimer's dementia 2013  . Pneumonia    Past Surgical History  Procedure Laterality Date  . Colon surgery  1996    cancer  . Spine surgery  2005  . Vascular surgery     Family History  Problem Relation Age of Onset  . Stroke Mother 71  . Hypertension Mother   . Cancer Sister   . Heart disease Brother     Heart Disease before age 45  . Heart attack Brother   . Cancer Sister   . Cancer Brother    History  Substance Use Topics  . Smoking status: Former Smoker -- 0.30 packs/day for 40 years    Types: Cigarettes    Quit date: 03/26/1993  . Smokeless tobacco:  Never Used  . Alcohol Use: No    Review of Systems  Constitutional: Negative for fever, activity change, appetite change and fatigue.  HENT: Negative for congestion, facial swelling, rhinorrhea and trouble swallowing.   Eyes: Negative for photophobia and pain.  Respiratory: Negative for cough, chest tightness and shortness of breath.   Cardiovascular: Negative for chest pain and leg swelling.  Gastrointestinal: Negative for nausea, vomiting, abdominal pain, diarrhea and constipation.  Endocrine: Negative for polydipsia and polyuria.  Genitourinary: Negative for dysuria, urgency, decreased urine volume and difficulty urinating.  Musculoskeletal: Negative for back pain, gait problem and stiffness.  Skin: Positive for color change. Negative for rash and wound.  Allergic/Immunologic: Negative for immunocompromised state.  Neurological: Negative for dizziness, facial asymmetry, speech difficulty, weakness, numbness and headaches.  Psychiatric/Behavioral: Negative for confusion, decreased concentration and agitation.    Allergies  Review of patient's allergies indicates no known allergies.  Home Medications   Current Outpatient Rx  Name  Route  Sig  Dispense  Refill  . acetaminophen-codeine (TYLENOL #3) 300-30 MG per tablet   Oral   Take 1 tablet by mouth every 4 (four) hours as needed for moderate pain.         Marland Kitchen albuterol (PROVENTIL) (2.5  MG/3ML) 0.083% nebulizer solution   Nebulization   Take 2.5 mg by nebulization every 6 (six) hours as needed for wheezing or shortness of breath.         Marland Kitchen aspirin 325 MG tablet   Oral   Take 325 mg by mouth daily.         . budesonide-formoterol (SYMBICORT) 160-4.5 MCG/ACT inhaler   Inhalation   Inhale 2 puffs into the lungs 2 (two) times daily.         . clopidogrel (PLAVIX) 75 MG tablet   Oral   Take 75 mg by mouth daily.          Marland Kitchen gabapentin (NEURONTIN) 100 MG capsule   Oral   Take 1 capsule (100 mg total) by mouth 2 (two)  times daily.   60 capsule   0   . metFORMIN (GLUCOPHAGE) 500 MG tablet   Oral   Take 500 mg by mouth at bedtime.         Marland Kitchen olmesartan (BENICAR) 40 MG tablet   Oral   Take 40 mg by mouth daily.         . rosuvastatin (CRESTOR) 20 MG tablet   Oral   Take 20 mg by mouth at bedtime.          Marland Kitchen oxyCODONE-acetaminophen (PERCOCET) 5-325 MG per tablet   Oral   Take 1 tablet by mouth every 6 (six) hours as needed for moderate pain.   30 tablet   0    BP 152/79  Pulse 84  Temp(Src) 98.3 F (36.8 C)  Resp 18  Ht 5\' 11"  (1.803 m)  Wt 168 lb (76.204 kg)  BMI 23.44 kg/m2  SpO2 100% Physical Exam  Constitutional: He is oriented to person, place, and time. He appears well-developed and well-nourished. No distress.  HENT:  Head: Normocephalic and atraumatic.  Mouth/Throat: No oropharyngeal exudate.  Eyes: Pupils are equal, round, and reactive to light.  Neck: Normal range of motion. Neck supple.  Cardiovascular: Normal rate, regular rhythm and normal heart sounds.  Exam reveals no gallop and no friction rub.   No murmur heard. Pulmonary/Chest: Effort normal and breath sounds normal. No respiratory distress. He has no wheezes. He has no rales.  Abdominal: Soft. Bowel sounds are normal. He exhibits no distension and no mass. There is no tenderness. There is no rebound and no guarding.  Musculoskeletal: Normal range of motion. He exhibits no edema and no tenderness.       Feet:  Decreased   Neurological: He is alert and oriented to person, place, and time.  Skin: Skin is warm and dry.  Psychiatric: He has a normal mood and affect.    ED Course  Procedures (including critical care time) Labs Review Labs Reviewed  CBC WITH DIFFERENTIAL - Abnormal; Notable for the following:    RBC 4.13 (*)    Hemoglobin 8.9 (*)    HCT 29.4 (*)    MCV 71.2 (*)    MCH 21.5 (*)    RDW 18.1 (*)    Eosinophils Relative 9 (*)    All other components within normal limits  BASIC METABOLIC  PANEL - Abnormal; Notable for the following:    Glucose, Bld 121 (*)    GFR calc non Af Amer 83 (*)    All other components within normal limits  PROTIME-INR  SAMPLE TO BLOOD BANK   Imaging Review No results found.  EKG Interpretation   None  MDM   1. PVD (peripheral vascular disease)   2. Ischemia of foot    Pt is a 79 y.o. male with Pmhx as above who presents with painful, blue toes. He has known severe PVD, worsening pain in feet for last 1 week, now w/ blue discoloration of toes R>L first noticed by wife 2 nights ago & worsening.  No known injury, no fever.  On PE, able to doppler L DP pulse but no others in BL feet. He has ischemia of multiple toes of R foot, and poor perfusion of BL feet.  Vascular, Dr. Donnetta Hutching consulted.  He reccomends improving pain control giving pt now having rest pain.  He would like to see him in F/u Early for arteriogram, likely axillary-fem, fem-fem bypass. Will d/c home with percocet. Return precautions given for new or worsening symptoms including fever, worsening redness, uncontrolled pain.        Neta Ehlers, MD 04/18/13 1102

## 2013-04-20 ENCOUNTER — Other Ambulatory Visit: Payer: Self-pay | Admitting: *Deleted

## 2013-04-20 ENCOUNTER — Encounter: Payer: Self-pay | Admitting: Family

## 2013-04-20 ENCOUNTER — Encounter (HOSPITAL_COMMUNITY)
Admission: AD | Disposition: A | Discharge status: Home / Self Care | Payer: Self-pay | Source: Ambulatory Visit | Attending: Vascular Surgery

## 2013-04-20 ENCOUNTER — Ambulatory Visit (HOSPITAL_COMMUNITY)
Admission: AD | Admit: 2013-04-20 | Discharge: 2013-04-20 | Disposition: A | Discharge status: Home / Self Care | Payer: Medicare HMO | Source: Ambulatory Visit | Attending: Vascular Surgery | Admitting: Vascular Surgery

## 2013-04-20 DIAGNOSIS — Z87891 Personal history of nicotine dependence: Secondary | ICD-10-CM | POA: Insufficient documentation

## 2013-04-20 DIAGNOSIS — Z7902 Long term (current) use of antithrombotics/antiplatelets: Secondary | ICD-10-CM | POA: Insufficient documentation

## 2013-04-20 DIAGNOSIS — I70219 Atherosclerosis of native arteries of extremities with intermittent claudication, unspecified extremity: Secondary | ICD-10-CM | POA: Insufficient documentation

## 2013-04-20 DIAGNOSIS — E119 Type 2 diabetes mellitus without complications: Secondary | ICD-10-CM | POA: Insufficient documentation

## 2013-04-20 DIAGNOSIS — Z7982 Long term (current) use of aspirin: Secondary | ICD-10-CM | POA: Insufficient documentation

## 2013-04-20 DIAGNOSIS — E785 Hyperlipidemia, unspecified: Secondary | ICD-10-CM | POA: Insufficient documentation

## 2013-04-20 DIAGNOSIS — J449 Chronic obstructive pulmonary disease, unspecified: Secondary | ICD-10-CM | POA: Insufficient documentation

## 2013-04-20 DIAGNOSIS — I739 Peripheral vascular disease, unspecified: Secondary | ICD-10-CM

## 2013-04-20 DIAGNOSIS — Z48812 Encounter for surgical aftercare following surgery on the circulatory system: Secondary | ICD-10-CM

## 2013-04-20 DIAGNOSIS — J4489 Other specified chronic obstructive pulmonary disease: Secondary | ICD-10-CM | POA: Insufficient documentation

## 2013-04-20 DIAGNOSIS — Z85038 Personal history of other malignant neoplasm of large intestine: Secondary | ICD-10-CM | POA: Insufficient documentation

## 2013-04-20 DIAGNOSIS — G309 Alzheimer's disease, unspecified: Secondary | ICD-10-CM | POA: Insufficient documentation

## 2013-04-20 DIAGNOSIS — F0280 Dementia in other diseases classified elsewhere without behavioral disturbance: Secondary | ICD-10-CM | POA: Insufficient documentation

## 2013-04-20 HISTORY — PX: ABDOMINAL ANGIOGRAM: SHX5499

## 2013-04-20 HISTORY — PX: PERCUTANEOUS STENT INTERVENTION: SHX5500

## 2013-04-20 HISTORY — PX: LOWER EXTREMITY ANGIOGRAM: SHX5508

## 2013-04-20 LAB — POCT I-STAT, CHEM 8
BUN: 19 mg/dL (ref 6–23)
Calcium, Ion: 1.28 mmol/L (ref 1.13–1.30)
Chloride: 102 mEq/L (ref 96–112)
Creatinine, Ser: 1 mg/dL (ref 0.50–1.35)
Glucose, Bld: 112 mg/dL — ABNORMAL HIGH (ref 70–99)
HEMATOCRIT: 33 % — AB (ref 39.0–52.0)
HEMOGLOBIN: 11.2 g/dL — AB (ref 13.0–17.0)
Potassium: 4.5 mEq/L (ref 3.7–5.3)
Sodium: 139 mEq/L (ref 137–147)
TCO2: 24 mmol/L (ref 0–100)

## 2013-04-20 LAB — POCT ACTIVATED CLOTTING TIME: ACTIVATED CLOTTING TIME: 171 s

## 2013-04-20 SURGERY — ABDOMINAL ANGIOGRAM
Anesthesia: LOCAL

## 2013-04-20 MED ORDER — HEPARIN (PORCINE) IN NACL 2-0.9 UNIT/ML-% IJ SOLN
INTRAMUSCULAR | Status: AC
  Filled 2013-04-20: qty 1000

## 2013-04-20 MED ORDER — LIDOCAINE HCL (PF) 1 % IJ SOLN
INTRAMUSCULAR | Status: AC
  Filled 2013-04-20: qty 30

## 2013-04-20 MED ORDER — SODIUM CHLORIDE 0.9 % IV SOLN: INTRAVENOUS | Status: DC

## 2013-04-20 MED ORDER — CLOPIDOGREL BISULFATE 75 MG PO TABS: 75.0000 mg | ORAL_TABLET | Freq: Every day | ORAL | Status: DC

## 2013-04-20 MED ORDER — HEPARIN SODIUM (PORCINE) 1000 UNIT/ML IJ SOLN
INTRAMUSCULAR | Status: AC
  Filled 2013-04-20: qty 1

## 2013-04-20 MED ORDER — NITROGLYCERIN 0.2 MG/ML ON CALL CATH LAB
INTRAVENOUS | Status: AC
  Filled 2013-04-20: qty 1

## 2013-04-20 MED ORDER — ONDANSETRON HCL 4 MG/2ML IJ SOLN: 4.0000 mg | Freq: Four times a day (QID) | INTRAMUSCULAR | Status: DC | PRN

## 2013-04-20 MED ORDER — ACETAMINOPHEN 325 MG PO TABS: 650.0000 mg | ORAL_TABLET | ORAL | Status: DC | PRN

## 2013-04-20 NOTE — Interval H&P Note (Signed)
History and Physical Interval Note:  04/20/2013 11:43 AM  [name]  has presented today for surgery, with the diagnosis of claudication  The various methods of treatment have been discussed with the patient and family. After consideration of risks, benefits and other options for treatment, the patient has consented to  Procedure(s): ABDOMINAL ANGIOGRAM (N/A) as a surgical intervention .  The patient's history has been reviewed, patient examined, no change in status, stable for surgery.  I have reviewed the patient's chart and labs.  Questions were answered to the patient's satisfaction.     Kaidyn Javid

## 2013-04-20 NOTE — Op Note (Signed)
OPERATIVE REPORT  DATE OF SURGERY: 04/20/2013  PATIENT: [name], 79 y.o. male MRN: [id number]  DOB: Jan 23, 1935  PRE-OPERATIVE DIAGNOSIS: Bilateral lower extremity arterial insufficiency right greater than left  POST-OPERATIVE DIAGNOSIS:  Same  PROCEDURE: #1 aortogram with bilateral lower extremity runoff via left brachial approach #2 angioplasty and balloon expandable stenting of right common iliac artery stenosis  SURGEON:  Curt Jews, M.D.  PHYSICIAN ASSISTANT: Nurse  ANESTHESIA:  1% lidocaine local  EBL: Minimal ml     BLOOD ADMINISTERED: None  DRAINS: None  SPECIMEN: None  COUNTS CORRECT:  YES  PLAN OF CARE: Holding area   PATIENT DISPOSITION:  PACU - hemodynamically stable  PROCEDURE DETAILS: The patient was taken to the professor cath lab placed supine position where the area of the both groins and the left arm were prepped and draped in usual sterile fashion. Patient had a history of a prior right to left fem-fem bypass and also a known occlusion of his left femoral popliteal bypass. Patient had no palpable femoral pulses assuming that he is occluded his inflow. Using local anesthesia the SonoSite for guidance the brachial artery was accessed just above the elbow. This was with a micropuncture sheath. The this was exchanged for [employer] sheath. The long Foley wire was passed centrally and this would not pass down the descending arch. For this reason a headhunter catheter was used to direct the wire down the descending thoracic aorta. This was taken down to the level of the suprarenal aorta. The pigtail catheter was then positioned at the level of the renal arteries and AP projections undertaken. This revealed widely patent single renal arteries and also widely patent infrarenal aorta. There was suggestion of irregularity in the common iliac artery on the right. There was complete occlusion of the common iliac artery on the left. The patient in fact did have a  patent right to left fem-fem bypass. A long leg runoff was obtained. This revealed bilateral superficial femoral artery occlusions. Oblique projections of the pelvis showed no technical difficulties regarding the bypass from the right to left femoral arteries. There was reconstitution of the above-knee popliteal on the right with posterior tibial being the dominant runoff vessel. The left femoropopliteal was occluded as none. The popliteal artery reconstituted at the above-knee position with smaller caliber. The peroneal artery was a dominant runoff vessel on the left. Oblique films of the pelvis revealed high-grade stenosis within the centric plaque in the common iliac artery on the right. Decision was made to intervene on this to improve flow to his feet.  The pigtail catheter was removed. The lesion was crossed using the headhunter catheter. Guidewire was left cross the lesion in the common iliac artery on the right. The 5 French sheath was removed and a long 6 [place] sheath was used to positioned through the level of the stenosis. This was then withdrawn back and a hand injection revealed the location of the the centric plaque stenosis. A 7 x 19 Omni link balloon and stent a combination were positioned at the level of the stenosis and inflated. Hand injection through the sheath again revealed excellent positioning and excellent result with no significant residual stenosis. The long sheath was removed and replaced with a short 6 sheath. The patient was taken to the cath lab holding area for sheath removal. There were no immediate complications.  Findings #1 high-grade common iliac artery stenosis on the right #2 patent right to left fem-fem bypass #3 bladder superficial femoral  artery occlusions with reconstituted popliteal and tibial runoff as described #4 successful angioplasty and stenting with a 7 mm balloon of the right common iliac artery stenosis   Curt Jews, M.D. 04/20/2013 1:30 PM

## 2013-04-20 NOTE — Discharge Instructions (Signed)
Angiography, Care After °Refer to this sheet in the next few weeks. These instructions provide you with information on caring for yourself after your procedure. Your health care provider may also give you more specific instructions. Your treatment has been planned according to current medical practices, but problems sometimes occur. Call your health care provider if you have any problems or questions after your procedure.  °WHAT TO EXPECT AFTER THE PROCEDURE °After your procedure, it is typical to have the following sensations: °· Minor discomfort or tenderness and a small bump at the catheter insertion site. The bump should usually decrease in size and tenderness within 1 to 2 weeks. °· Any bruising will usually fade within 2 to 4 weeks. °HOME CARE INSTRUCTIONS  °· You may need to keep taking blood thinners if they were prescribed for you. Only take over-the-counter or prescription medicines for pain, fever, or discomfort as directed by your health care provider. °· Do not apply powder or lotion to the site. °· Do not sit in a bathtub, swimming pool, or whirlpool for 5 to 7 days. °· You may shower 24 hours after the procedure. Remove the bandage (dressing) and gently wash the site with plain soap and water. Gently pat the site dry. °· Inspect the site at least twice daily. °· Limit your activity for the first 48 hours. Do not bend, squat, or lift anything over 20 lb (9 kg) or as directed by your health care provider. °· Do not drive home if you are discharged the day of the procedure. Have someone else drive you. Follow instructions about when you can drive or return to work. °SEEK MEDICAL CARE IF: °· You get lightheaded when standing up. °· You have drainage (other than a small amount of blood on the dressing). °· You have chills. °· You have a fever. °· You have redness, warmth, swelling, or pain at the insertion site. °SEEK IMMEDIATE MEDICAL CARE IF:  °· You develop chest pain or shortness of breath, feel faint,  or pass out. °· You have bleeding, swelling larger than a walnut, or drainage from the catheter insertion site. °· You develop pain, discoloration, coldness, or severe bruising in the leg or arm that held the catheter. °· You develop bleeding from any other place, such as the bowels. You may see bright red blood in your urine or stools, or your stools may appear black and tarry. °· You have heavy bleeding from the site. If this happens, hold pressure on the site. °MAKE SURE YOU: °· Understand these instructions. °· Will watch your condition. °· Will get help right away if you are not doing well or get worse. °Document Released: 09/28/2004 Document Revised: 11/12/2012 Document Reviewed: 08/04/2012 °Exit[employer]® Patient Information ©2014 [employer]. ° °

## 2013-04-20 NOTE — H&P (View-Only) (Signed)
    Patient name: [name] MRN: [id number] DOB: 06/17/1934 Sex: male   Referred by: Docherty  Reason for referral:  Chief Complaint  Patient presents with  . no pulse rt leg     HISTORY OF PRESENT ILLNESS: The patient presents to the emergency room physician with rest pain in his lower extremity. This is worse on the right than on the left. He has a very complex past history. He does have a prior history of femoral to femoral bypass and left femoral to above-knee popliteal bypass with Gore-Tex graft. He was last seen in our office by Dr. Lawson approximately one year ago. At that time he had included his left femoropopliteal bypass which was present since 2003. He was noted to have 2-3+ femoral pulses bilaterally. He was not having any limiting claudication or rest pain and therefore was to be seen again in approximately this time this year. He did have an appointment for us in one week. He has had difficulty over the last several weeks with a recent admission for vomiting and some exacerbation of COPD. Does have a history of colon cancer. He has had no evidence of recurrence for 17 years. He does have dementia but does live at home. Over the past several lites he has had a worsening rest pain. His wife reports this is been present for approximately one week. He was taking Tylenol with Codeine with minimal relief. He was having some relief with Advil. He is chronically on Plavix last dose yesterday.  Past Medical History  Diagnosis Date  . Hyperlipidemia   . PVD (peripheral vascular disease)   . Hypertension   . Dyspnea   . COPD (chronic obstructive pulmonary disease)   . Colon cancer   . Diabetes mellitus   . Alzheimer's dementia 2013  . Pneumonia     Past Surgical History  Procedure Laterality Date  . Colon surgery  1996    cancer  . Spine surgery  2005  . Vascular surgery      History   Social History  . Marital Status: Married    Spouse Name: N/A    Number of  Children: N/A  . Years of Education: N/A   Occupational History  . Not on file.   Social History Main Topics  . Smoking status: Former Smoker -- 0.30 packs/day for 40 years    Types: Cigarettes    Quit date: 03/26/1993  . Smokeless tobacco: Never Used  . Alcohol Use: No  . Drug Use: No  . Sexual Activity: Not Currently   Other Topics Concern  . Not on file   Social History Narrative  . No narrative on file    Family History  Problem Relation Age of Onset  . Stroke Mother 75  . Hypertension Mother   . Cancer Sister   . Heart disease Brother     Heart Disease before age 60  . Heart attack Brother   . Cancer Sister   . Cancer Brother     Allergies as of 04/17/2013  . (No Known Allergies)    No current facility-administered medications on file prior to encounter.   Current Outpatient Prescriptions on File Prior to Encounter  Medication Sig Dispense Refill  . aspirin 325 MG tablet Take 325 mg by mouth daily.      . clopidogrel (PLAVIX) 75 MG tablet Take 75 mg by mouth daily.       . gabapentin (NEURONTIN) 100 MG capsule Take 1 capsule (  100 mg total) by mouth 2 (two) times daily.  60 capsule  0  . metFORMIN (GLUCOPHAGE) 500 MG tablet Take 500 mg by mouth at bedtime.      Marland Kitchen olmesartan (BENICAR) 40 MG tablet Take 40 mg by mouth daily.      . rosuvastatin (CRESTOR) 20 MG tablet Take 20 mg by mouth at bedtime.          REVIEW OF SYSTEMS:  Review of systems all negative except for those listed in the past medical history  PHYSICAL EXAMINATION:  General: The patient is a well-nourished male, in no acute distress. Comfortable. A leak and he Vital signs are BP 169/74  Pulse 81  Temp(Src) 98.3 F (36.8 C)  Resp 18  Ht 5\' 11"  (1.803 m)  Wt 168 lb (76.204 kg)  BMI 23.44 kg/m2  SpO2 100% Pulmonary: There is a good air exchange  Abdomen: Soft and non-tender no masses noted. Fem-fem bypass occluded physical exam Musculoskeletal: There are no major deformities.     Neurologic: No focal weakness or paresthesias are detected, Skin: Does have some superficial blistering on the tips of several toes on the right foot. Nothing on the left. Psychiatric: The patient has normal affect. He is confused. He does carry on a conversation. Cardiovascular: 2+ radial pulses bilaterally. Absent femoral popliteal and pedal pulses Does have dorsalis pedis Doppler flow on the left foot and posterior tibial Doppler flow on the right foot. Both feet are warm and he has no tenderness in his calves.   Impression and Plan:  Rest pain right greater than left. Clearly has had progression of his occlusive disease. Next this does this at length with the patient and his wife present and also with their son. He does have motor and sensory function intact. I have recommended that we proceed with arteriogram on Monday morning with probable need for access fem-fem bypass. I explained that he has clearly lost inflow and this is why is having worsening symptoms. Fortunately has a creatinine was totally normal on his recent admission. He did not have any critical coronary disease but was to see his cardiologist back in several weeks. I explained to the wife that this really is not an elective with him having a rest pain. He will be discharged to home with Percocet 1-2 every 4 hours. I gave to the patient's wife my contact information that he is having difficulty with pain control over the weekend we will admit him for pain control. Plan is for outpatient arteriogram on Monday and surgery to follow.    Halli Equihua [facility] Office: [phone]

## 2013-04-21 ENCOUNTER — Ambulatory Visit: Payer: Medicare Other | Admitting: Family

## 2013-04-21 ENCOUNTER — Encounter (HOSPITAL_COMMUNITY): Payer: Medicare Other

## 2013-04-21 LAB — GLUCOSE, CAPILLARY: GLUCOSE-CAPILLARY: 84 mg/dL (ref 70–99)

## 2013-04-22 ENCOUNTER — Telehealth: Payer: Self-pay | Admitting: Vascular Surgery

## 2013-04-22 NOTE — Telephone Encounter (Addendum)
Message copied by Gena Fray on Wed Apr 22, 2013 10:24 AM ------      Message from: Peter Minium K      Created: Mon Apr 20, 2013  5:04 PM      Regarding: schedule                   ----- Message -----         From: Sherrye Payor, RN         Sent: 04/20/2013   1:52 PM           To: Mena Goes, CMA                        ----- Message -----         From: Rosetta Posner, MD         Sent: 04/20/2013   1:25 PM           To: Patrici Ranks, Sherrye Payor, RN            Patient underwent brachial artery access with SonoSite ultrasound, had aortogram with bilateral extremity runoff, had selective catheterization of right common iliac artery. At angioplasty and stenting of his right common iliac artery.            The patient needs to see Dr. Kellie Simmering in the office in 2-3 weeks better to be 2 weeks with lower extremity to ABI. Established patient of Dr. Kellie Simmering. ------  04/22/13: unable to reach patient by phone- vm not set up. Mailed letter for appt on 02/17 @ 2:30pm, dpm

## 2013-04-27 ENCOUNTER — Telehealth: Payer: Self-pay

## 2013-04-27 NOTE — Telephone Encounter (Signed)
[name] called to report (R) foot very swollen/tight, warm, and hot since Sunday.  States the (R) 2nd and 3rd toes have the skin peeling off, with the 3rd toe having dried blood on the toe and beneath the toenail; the 5th toenail is falling off/ and has bloody drainage. Reports BP 138/80, Pulse "OK" / cannot remember the pulse rate, Temp. 98.9.  States pt. has dementia, and isn't able to communicate any specific complaints.  Discussed w/ Dr. Kellie Simmering.  Recommends to schedule appt. 2/3 at 8:30 AM.  Discussed w/ Wife.  Agrees w/ plan.

## 2013-04-28 ENCOUNTER — Inpatient Hospital Stay (HOSPITAL_COMMUNITY)
Admission: AD | Admit: 2013-04-28 | Discharge: 2013-05-02 | DRG: 254 | Disposition: A | Discharge status: Home / Self Care | Payer: Medicare HMO | Source: Ambulatory Visit | Attending: Vascular Surgery | Admitting: Vascular Surgery

## 2013-04-28 ENCOUNTER — Encounter (HOSPITAL_COMMUNITY): Payer: Self-pay | Admitting: General Practice

## 2013-04-28 ENCOUNTER — Ambulatory Visit (INDEPENDENT_AMBULATORY_CARE_PROVIDER_SITE_OTHER): Payer: Commercial Managed Care - HMO | Admitting: Vascular Surgery

## 2013-04-28 ENCOUNTER — Inpatient Hospital Stay (HOSPITAL_COMMUNITY): Payer: Medicare HMO

## 2013-04-28 ENCOUNTER — Encounter: Payer: Self-pay | Admitting: Vascular Surgery

## 2013-04-28 VITALS — BP 181/88 | HR 95 | Temp 97.1°F | Ht 72.0 in | Wt 168.0 lb

## 2013-04-28 DIAGNOSIS — I739 Peripheral vascular disease, unspecified: Secondary | ICD-10-CM | POA: Diagnosis present

## 2013-04-28 DIAGNOSIS — E119 Type 2 diabetes mellitus without complications: Secondary | ICD-10-CM | POA: Diagnosis present

## 2013-04-28 DIAGNOSIS — I70269 Atherosclerosis of native arteries of extremities with gangrene, unspecified extremity: Principal | ICD-10-CM | POA: Diagnosis present

## 2013-04-28 DIAGNOSIS — Z87891 Personal history of nicotine dependence: Secondary | ICD-10-CM

## 2013-04-28 DIAGNOSIS — G309 Alzheimer's disease, unspecified: Secondary | ICD-10-CM | POA: Diagnosis present

## 2013-04-28 DIAGNOSIS — J4489 Other specified chronic obstructive pulmonary disease: Secondary | ICD-10-CM | POA: Diagnosis present

## 2013-04-28 DIAGNOSIS — Z85038 Personal history of other malignant neoplasm of large intestine: Secondary | ICD-10-CM

## 2013-04-28 DIAGNOSIS — E785 Hyperlipidemia, unspecified: Secondary | ICD-10-CM | POA: Diagnosis present

## 2013-04-28 DIAGNOSIS — J449 Chronic obstructive pulmonary disease, unspecified: Secondary | ICD-10-CM | POA: Diagnosis present

## 2013-04-28 DIAGNOSIS — F0280 Dementia in other diseases classified elsewhere without behavioral disturbance: Secondary | ICD-10-CM | POA: Diagnosis present

## 2013-04-28 DIAGNOSIS — R791 Abnormal coagulation profile: Secondary | ICD-10-CM | POA: Diagnosis present

## 2013-04-28 DIAGNOSIS — I10 Essential (primary) hypertension: Secondary | ICD-10-CM | POA: Diagnosis present

## 2013-04-28 HISTORY — DX: Major depressive disorder, single episode, unspecified: F32.9

## 2013-04-28 HISTORY — DX: Depression, unspecified: F32.A

## 2013-04-28 HISTORY — DX: Type 2 diabetes mellitus without complications: E11.9

## 2013-04-28 LAB — COMPREHENSIVE METABOLIC PANEL
ALK PHOS: 72 U/L (ref 39–117)
ALT: 5 U/L (ref 0–53)
AST: 15 U/L (ref 0–37)
Albumin: 3.1 g/dL — ABNORMAL LOW (ref 3.5–5.2)
BILIRUBIN TOTAL: 0.5 mg/dL (ref 0.3–1.2)
BUN: 14 mg/dL (ref 6–23)
CHLORIDE: 102 meq/L (ref 96–112)
CO2: 22 mEq/L (ref 19–32)
Calcium: 9.1 mg/dL (ref 8.4–10.5)
Creatinine, Ser: 0.93 mg/dL (ref 0.50–1.35)
GFR calc non Af Amer: 80 mL/min — ABNORMAL LOW (ref >90)
GLUCOSE: 143 mg/dL — AB (ref 70–99)
POTASSIUM: 4.3 meq/L (ref 3.7–5.3)
Sodium: 139 mEq/L (ref 137–147)
Total Protein: 7 g/dL (ref 6.0–8.3)

## 2013-04-28 LAB — URINE MICROSCOPIC-ADD ON

## 2013-04-28 LAB — CBC
HCT: 29.7 % — ABNORMAL LOW (ref 39.0–52.0)
HEMOGLOBIN: 8.8 g/dL — AB (ref 13.0–17.0)
MCH: 21 pg — ABNORMAL LOW (ref 26.0–34.0)
MCHC: 29.6 g/dL — ABNORMAL LOW (ref 30.0–36.0)
MCV: 70.9 fL — ABNORMAL LOW (ref 78.0–100.0)
Platelets: 388 10*3/uL (ref 150–400)
RBC: 4.19 MIL/uL — ABNORMAL LOW (ref 4.22–5.81)
RDW: 17.5 % — ABNORMAL HIGH (ref 11.5–15.5)
WBC: 8.9 10*3/uL (ref 4.0–10.5)

## 2013-04-28 LAB — GLUCOSE, CAPILLARY
GLUCOSE-CAPILLARY: 129 mg/dL — AB (ref 70–99)
Glucose-Capillary: 143 mg/dL — ABNORMAL HIGH (ref 70–99)
Glucose-Capillary: 105 mg/dL — ABNORMAL HIGH (ref 70–99)

## 2013-04-28 LAB — URINALYSIS, ROUTINE W REFLEX MICROSCOPIC
BILIRUBIN URINE: NEGATIVE
GLUCOSE, UA: NEGATIVE mg/dL
Ketones, ur: NEGATIVE mg/dL
Nitrite: NEGATIVE
PROTEIN: NEGATIVE mg/dL
Specific Gravity, Urine: 1.018 (ref 1.005–1.030)
UROBILINOGEN UA: 2 mg/dL — AB (ref 0.0–1.0)
pH: 6 (ref 5.0–8.0)

## 2013-04-28 LAB — PROTIME-INR
INR: 1 (ref 0.00–1.49)
PROTHROMBIN TIME: 13 s (ref 11.6–15.2)

## 2013-04-28 LAB — MRSA PCR SCREENING: MRSA by PCR: NEGATIVE

## 2013-04-28 MED ORDER — GABAPENTIN 100 MG PO CAPS
100.0000 mg | ORAL_CAPSULE | Freq: Two times a day (BID) | ORAL | Status: DC
  Administered 2013-04-28 – 2013-05-02 (×8): 100 mg via ORAL
  Filled 2013-04-28 (×10): qty 1

## 2013-04-28 MED ORDER — HYDRALAZINE HCL 20 MG/ML IJ SOLN: 10.0000 mg | INTRAMUSCULAR | Status: DC | PRN

## 2013-04-28 MED ORDER — DEXTROSE 5 % IV SOLN
1.5000 g | INTRAVENOUS | Status: AC
  Administered 2013-04-29: 1.5 g via INTRAVENOUS
  Filled 2013-04-28: qty 1.5

## 2013-04-28 MED ORDER — METFORMIN HCL 500 MG PO TABS
500.0000 mg | ORAL_TABLET | Freq: Every day | ORAL | Status: DC
  Administered 2013-04-28: 500 mg via ORAL
  Filled 2013-04-28 (×2): qty 1

## 2013-04-28 MED ORDER — ALBUTEROL SULFATE (2.5 MG/3ML) 0.083% IN NEBU: 2.5000 mg | INHALATION_SOLUTION | Freq: Four times a day (QID) | RESPIRATORY_TRACT | Status: DC | PRN

## 2013-04-28 MED ORDER — OXYCODONE-ACETAMINOPHEN 5-325 MG PO TABS: 1.0000 | ORAL_TABLET | Freq: Four times a day (QID) | ORAL | Status: DC | PRN

## 2013-04-28 MED ORDER — SODIUM CHLORIDE 0.9 % IV SOLN
INTRAVENOUS | Status: DC
  Administered 2013-04-28: 20 mL via INTRAVENOUS

## 2013-04-28 MED ORDER — PANTOPRAZOLE SODIUM 40 MG PO TBEC
40.0000 mg | DELAYED_RELEASE_TABLET | Freq: Every day | ORAL | Status: DC
  Administered 2013-04-28: 40 mg via ORAL
  Filled 2013-04-28: qty 1

## 2013-04-28 MED ORDER — METOPROLOL TARTRATE 1 MG/ML IV SOLN: 2.0000 mg | INTRAVENOUS | Status: DC | PRN

## 2013-04-28 MED ORDER — ASPIRIN 325 MG PO TABS
325.0000 mg | ORAL_TABLET | Freq: Every day | ORAL | Status: DC
  Filled 2013-04-28 (×2): qty 1

## 2013-04-28 MED ORDER — LABETALOL HCL 5 MG/ML IV SOLN
10.0000 mg | INTRAVENOUS | Status: DC | PRN
  Filled 2013-04-28: qty 4

## 2013-04-28 MED ORDER — ALUM & MAG HYDROXIDE-SIMETH 200-200-20 MG/5ML PO SUSP: 15.0000 mL | ORAL | Status: DC | PRN

## 2013-04-28 MED ORDER — BUDESONIDE-FORMOTEROL FUMARATE 160-4.5 MCG/ACT IN AERO
2.0000 | INHALATION_SPRAY | Freq: Two times a day (BID) | RESPIRATORY_TRACT | Status: DC
  Administered 2013-04-28 – 2013-05-02 (×7): 2 via RESPIRATORY_TRACT
  Filled 2013-04-28 (×2): qty 6

## 2013-04-28 MED ORDER — ACETAMINOPHEN 650 MG RE SUPP: 325.0000 mg | RECTAL | Status: DC | PRN

## 2013-04-28 MED ORDER — ONDANSETRON HCL 4 MG/2ML IJ SOLN: 4.0000 mg | Freq: Four times a day (QID) | INTRAMUSCULAR | Status: DC | PRN

## 2013-04-28 MED ORDER — ACETAMINOPHEN-CODEINE #3 300-30 MG PO TABS: 1.0000 | ORAL_TABLET | ORAL | Status: DC | PRN

## 2013-04-28 MED ORDER — ACETAMINOPHEN 325 MG PO TABS: 325.0000 mg | ORAL_TABLET | ORAL | Status: DC | PRN

## 2013-04-28 MED ORDER — OXYCODONE-ACETAMINOPHEN 5-325 MG PO TABS: 1.0000 | ORAL_TABLET | ORAL | Status: DC | PRN

## 2013-04-28 MED ORDER — ATORVASTATIN CALCIUM 40 MG PO TABS
40.0000 mg | ORAL_TABLET | Freq: Every day | ORAL | Status: DC
  Administered 2013-04-29 – 2013-05-01 (×3): 40 mg via ORAL
  Filled 2013-04-28 (×5): qty 1

## 2013-04-28 MED ORDER — IRBESARTAN 300 MG PO TABS
300.0000 mg | ORAL_TABLET | Freq: Every day | ORAL | Status: DC
  Administered 2013-04-28 – 2013-05-02 (×4): 300 mg via ORAL
  Filled 2013-04-28 (×5): qty 1

## 2013-04-28 MED ORDER — GUAIFENESIN-DM 100-10 MG/5ML PO SYRP: 15.0000 mL | ORAL_SOLUTION | ORAL | Status: DC | PRN

## 2013-04-28 MED ORDER — CLOPIDOGREL BISULFATE 75 MG PO TABS
75.0000 mg | ORAL_TABLET | Freq: Every day | ORAL | Status: DC
  Filled 2013-04-28: qty 1

## 2013-04-28 MED ORDER — PHENOL 1.4 % MT LIQD
1.0000 | OROMUCOSAL | Status: DC | PRN
  Filled 2013-04-28: qty 177

## 2013-04-28 MED ORDER — POTASSIUM CHLORIDE CRYS ER 20 MEQ PO TBCR
20.0000 meq | EXTENDED_RELEASE_TABLET | Freq: Once | ORAL | Status: AC
  Administered 2013-04-28: 20 meq via ORAL
  Filled 2013-04-28: qty 1

## 2013-04-28 MED ORDER — MORPHINE SULFATE 2 MG/ML IJ SOLN: 2.0000 mg | INTRAMUSCULAR | Status: DC | PRN

## 2013-04-28 NOTE — H&P (Signed)
Vascular Surgery H&P  Chief Complaint: Gangrene right third fourth and fifth toes with right femoral-popliteal occlusive disease  HPI: [name] is a 79 y.o. male who presents for evaluation of gangrene right third toe with ischemic fourth and fifth toes. Patient had angioplasty and stenting of right common iliac artery by Dr. early last week. He has right superficial femoral occlusion. He does have chronic dementia. He is scheduled for discussion of right femoral-popliteal bypass graft today   Past Medical History  Diagnosis Date  . Hyperlipidemia   . PVD (peripheral vascular disease)   . Hypertension   . Dyspnea   . COPD (chronic obstructive pulmonary disease)   . Colon cancer   . Diabetes mellitus   . Alzheimer's dementia 2013  . Pneumonia    Past Surgical History  Procedure Laterality Date  . Colon surgery  1996    cancer  . Spine surgery  2005  . Vascular surgery     History   Social History  . Marital Status: Married    Spouse Name: N/A    Number of Children: N/A  . Years of Education: N/A   Social History Main Topics  . Smoking status: Former Smoker -- 0.30 packs/day for 40 years    Types: Cigarettes    Quit date: 03/26/1993  . Smokeless tobacco: Never Used  . Alcohol Use: No  . Drug Use: No  . Sexual Activity: Not Currently   Other Topics Concern  . Not on file   Social History Narrative  . No narrative on file   Family History  Problem Relation Age of Onset  . Stroke Mother 3  . Hypertension Mother   . Cancer Sister   . Heart disease Brother     Heart Disease before age 36  . Heart attack Brother   . Cancer Sister   . Cancer Brother    No Known Allergies Prior to Admission medications   Medication Sig Start Date End Date Taking? Authorizing Provider  albuterol (PROVENTIL) (2.5 MG/3ML) 0.083% nebulizer solution Take 2.5 mg by nebulization every 6 (six) hours as needed for wheezing or shortness of breath.   Yes Historical Provider, MD   budesonide-formoterol (SYMBICORT) 160-4.5 MCG/ACT inhaler Inhale 2 puffs into the lungs 2 (two) times daily.   Yes Historical Provider, MD  Cholecalciferol (VITAMIN D) 2000 UNITS CAPS Take 1 capsule by mouth at bedtime.   Yes Historical Provider, MD  clopidogrel (PLAVIX) 75 MG tablet Take 75 mg by mouth daily.  04/04/12  Yes Historical Provider, MD  gabapentin (NEURONTIN) 100 MG capsule Take 1 capsule (100 mg total) by mouth 2 (two) times daily. 03/24/13  Yes Orson Eva, MD  metFORMIN (GLUCOPHAGE) 500 MG tablet Take 500 mg by mouth at bedtime.   Yes Historical Provider, MD  olmesartan (BENICAR) 40 MG tablet Take 40 mg by mouth daily.   Yes Historical Provider, MD  oxyCODONE-acetaminophen (PERCOCET) 5-325 MG per tablet Take 1 tablet by mouth every 6 (six) hours as needed for moderate pain. 04/17/13  Yes Neta Ehlers, MD  rosuvastatin (CRESTOR) 20 MG tablet Take 20 mg by mouth at bedtime.    Yes Historical Provider, MD     Positive ROS: Patient is not reliable historian-has dementia-denies chest pain or dyspnea on exertion, PND, orthopnea  All other systems have been reviewed and were otherwise negative with the exception of those mentioned in the HPI and as above.  Physical Exam: There were no vitals filed for this visit.  General: Alert, no acute distress HEENT: Normal for age Cardiovascular: Regular rate and rhythm. Carotid pulses 2+, no bruits audible Respiratory: Clear to auscultation. No cyanosis, no use of accessory musculature GI: No organomegaly, abdomen is soft and non-tender Skin: No lesions in the area of chief complaint Neurologic: Sensation intact distally Psychiatric: Patient is competent for consent with normal mood and affect Musculoskeletal: No obvious deformities Extremities: Right leg with 2+ femoral absent popliteal and distal pulses. Dry gangrene on anterior aspect right third toe with ischemic changes fourth and fifth toes on the plantar surface. Left leg with 2+  femoral pulse absent popliteal and distal pulses no evidence of ischemia.    Imaging reviewed: Angiograms performed Dr. early last week were reviewed and revealed patient is candidate for right femoral-popliteal bypass graft. He has disease runoff vessels but does have posterior tibial artery patent to the ankle.   Assessment/Plan:  Admit patient today because of dementia and elevated INR on Coumadin-will need to correct this prior to surgery in a.m. Plan right femoral-popliteal bypass graft tomorrow-with saphenous vein or Gore-Tex depending on the quality of vein Discuss this with the patient and his wife and they do wish to proceed   [name], MD 04/28/2013 11:12 AM

## 2013-04-28 NOTE — Progress Notes (Signed)
Subjective:     Patient ID: [name], male   DOB: Aug 16, 1934, 79 y.o.   MRN: [id number]  HPI this 55 her old male with dementia returns today for followup regarding Edwin ischemic ulcer in the right foot. He has gangrenous changes in Edwin right third fourth and fifth toes. He had PTA and angioplasty and stenting of Edwin right common iliac artery by Dr. early one week ago. The patient has also right superficial femoral occlusion with patent popliteal artery and diseased tibial vessels. He returns for discussion of possible surgery for limb salvage.  Past Medical History  Diagnosis Date  . Hyperlipidemia   . PVD (peripheral vascular disease)   . Hypertension   . Dyspnea   . COPD (chronic obstructive pulmonary disease)   . Colon cancer   . Diabetes mellitus   . Alzheimer's dementia 2013  . Pneumonia     History  Substance Use Topics  . Smoking status: Former Smoker -- 0.30 packs/day for 40 years    Types: Cigarettes    Quit date: 03/26/1993  . Smokeless tobacco: Never Used  . Alcohol Use: No    Family History  Problem Relation Age of Onset  . Stroke Mother 60  . Hypertension Mother   . Cancer Sister   . Heart disease Brother     Heart Disease before age 63  . Heart attack Brother   . Cancer Sister   . Cancer Brother     No Known Allergies  Current outpatient prescriptions:acetaminophen-codeine (TYLENOL #3) 300-30 MG per tablet, Take 1 tablet by mouth every 4 (four) hours as needed for moderate pain., Disp: , Rfl: ;  albuterol (PROVENTIL) (2.5 MG/3ML) 0.083% nebulizer solution, Take 2.5 mg by nebulization every 6 (six) hours as needed for wheezing or shortness of breath., Disp: , Rfl: ;  aspirin 325 MG tablet, Take 325 mg by mouth daily., Disp: , Rfl:  budesonide-formoterol (SYMBICORT) 160-4.5 MCG/ACT inhaler, Inhale 2 puffs into the lungs 2 (two) times daily., Disp: , Rfl: ;  clopidogrel (PLAVIX) 75 MG tablet, Take 75 mg by mouth daily. , Disp: , Rfl: ;  gabapentin (NEURONTIN)  100 MG capsule, Take 1 capsule (100 mg total) by mouth 2 (two) times daily., Disp: 60 capsule, Rfl: 0;  metFORMIN (GLUCOPHAGE) 500 MG tablet, Take 500 mg by mouth at bedtime., Disp: , Rfl:  olmesartan (BENICAR) 40 MG tablet, Take 40 mg by mouth daily., Disp: , Rfl: ;  oxyCODONE-acetaminophen (PERCOCET) 5-325 MG per tablet, Take 1 tablet by mouth every 6 (six) hours as needed for moderate pain., Disp: 30 tablet, Rfl: 0;  rosuvastatin (CRESTOR) 20 MG tablet, Take 20 mg by mouth at bedtime. , Disp: , Rfl:   BP 181/88  Pulse 95  Temp(Src) 97.1 F (36.2 C)  Ht 6' (1.829 m)  Wt 168 lb (76.204 kg)  BMI 22.78 kg/m2  SpO2 100%  Body mass index is 22.78 kg/(m^2).          Review of Systems patient has chronic dementia is cared for by [name]. Denies active chest pain or dyspnea on exertion PND orthopnea or hemoptysis. Patient does have pain and swelling right foot as discussed. As COPD with no recent exacerbation. Other systems negative and complete review of systems    Objective:   Physical Exam BP 181/88  Pulse 95  Temp(Src) 97.1 F (36.2 C)  Ht 6' (1.829 m)  Wt 168 lb (76.204 kg)  BMI 22.78 kg/m2  SpO2 100%  Gen.-alert and oriented  x3 in no apparent distress-does not answer all questions appropriately HEENT normal for age Lungs no rhonchi or wheezing Cardiovascular regular rhythm no murmurs carotid pulses 3+ palpable no bruits audible Abdomen soft nontender no palpable masses Musculoskeletal free of  major deformities Skin clear -no rashes Neurologic normal Lower extremities 2+ femoral pulses bilaterally. Patent right to left femoral-femoral bypass. No popliteal or distal pulses palpable in either lower extremity. Dry gangrene anterior aspect right third toe with ischemic changes fourth and fifth toe plantar surface with no fluctuance or drainage.       Assessment:     Ischemic ulcers toes right foot with successful PTA stenting right common iliac artery one week ago by Dr.  Mayer Camel with known superficial femoral occlusion    Plan:     Plan right femoral-popliteal bypass graft tomorrow Will admit the patient today to check on PT-INR to see if this needs correction with fresh frozen plasma Patient has dementia and patient [name] will not be able to get him to the hospital in early a.m. Discussed situation with the patient's [name] and she understands and agrees to proceed

## 2013-04-29 ENCOUNTER — Encounter (HOSPITAL_COMMUNITY): Payer: Self-pay | Admitting: Certified Registered Nurse Anesthetist

## 2013-04-29 ENCOUNTER — Inpatient Hospital Stay (HOSPITAL_COMMUNITY): Payer: Medicare HMO

## 2013-04-29 ENCOUNTER — Inpatient Hospital Stay (HOSPITAL_COMMUNITY): Payer: Medicare HMO | Admitting: Anesthesiology

## 2013-04-29 ENCOUNTER — Encounter (HOSPITAL_COMMUNITY): Payer: Medicare HMO | Admitting: Anesthesiology

## 2013-04-29 ENCOUNTER — Encounter (HOSPITAL_COMMUNITY)
Admission: AD | Disposition: A | Discharge status: Home / Self Care | Payer: Commercial Managed Care - HMO | Source: Ambulatory Visit | Attending: Vascular Surgery

## 2013-04-29 DIAGNOSIS — I70269 Atherosclerosis of native arteries of extremities with gangrene, unspecified extremity: Secondary | ICD-10-CM | POA: Diagnosis not present

## 2013-04-29 HISTORY — PX: INTRAOPERATIVE ARTERIOGRAM: SHX5157

## 2013-04-29 HISTORY — PX: FEMORAL-POPLITEAL BYPASS GRAFT: SHX937

## 2013-04-29 LAB — CBC
HCT: 34 % — ABNORMAL LOW (ref 39.0–52.0)
Hemoglobin: 10.4 g/dL — ABNORMAL LOW (ref 13.0–17.0)
MCH: 22.8 pg — AB (ref 26.0–34.0)
MCHC: 30.6 g/dL (ref 30.0–36.0)
MCV: 74.4 fL — AB (ref 78.0–100.0)
PLATELETS: 346 10*3/uL (ref 150–400)
RBC: 4.57 MIL/uL (ref 4.22–5.81)
RDW: 19.3 % — AB (ref 11.5–15.5)
WBC: 10.9 10*3/uL — ABNORMAL HIGH (ref 4.0–10.5)

## 2013-04-29 LAB — GLUCOSE, CAPILLARY
Glucose-Capillary: 103 mg/dL — ABNORMAL HIGH (ref 70–99)
Glucose-Capillary: 102 mg/dL — ABNORMAL HIGH (ref 70–99)
Glucose-Capillary: 111 mg/dL — ABNORMAL HIGH (ref 70–99)
Glucose-Capillary: 128 mg/dL — ABNORMAL HIGH (ref 70–99)

## 2013-04-29 LAB — CREATININE, SERUM
Creatinine, Ser: 0.89 mg/dL (ref 0.50–1.35)
GFR calc Af Amer: >90 mL/min (ref >90)
GFR calc non Af Amer: 81 mL/min — ABNORMAL LOW (ref >90)

## 2013-04-29 LAB — PREPARE RBC (CROSSMATCH)

## 2013-04-29 SURGERY — BYPASS GRAFT FEMORAL-POPLITEAL ARTERY
Anesthesia: General | Site: Leg Upper | Laterality: Right

## 2013-04-29 MED ORDER — POTASSIUM CHLORIDE CRYS ER 20 MEQ PO TBCR: 20.0000 meq | EXTENDED_RELEASE_TABLET | Freq: Every day | ORAL | Status: DC | PRN

## 2013-04-29 MED ORDER — LIDOCAINE HCL (CARDIAC) 20 MG/ML IV SOLN
INTRAVENOUS | Status: AC
Start: 2013-04-29 — End: 2013-04-29
  Filled 2013-04-29: qty 5

## 2013-04-29 MED ORDER — FENTANYL CITRATE 0.05 MG/ML IJ SOLN
INTRAMUSCULAR | Status: DC | PRN
  Administered 2013-04-29 (×3): 50 ug via INTRAVENOUS
  Administered 2013-04-29: 100 ug via INTRAVENOUS

## 2013-04-29 MED ORDER — GLYCOPYRROLATE 0.2 MG/ML IJ SOLN
INTRAMUSCULAR | Status: DC | PRN
  Administered 2013-04-29: .6 mg via INTRAVENOUS

## 2013-04-29 MED ORDER — ENOXAPARIN SODIUM 40 MG/0.4ML ~~LOC~~ SOLN
40.0000 mg | SUBCUTANEOUS | Status: DC
  Administered 2013-04-30 – 2013-05-02 (×3): 40 mg via SUBCUTANEOUS
  Filled 2013-04-29 (×3): qty 0.4

## 2013-04-29 MED ORDER — METOPROLOL TARTRATE 1 MG/ML IV SOLN: 2.0000 mg | INTRAVENOUS | Status: DC | PRN

## 2013-04-29 MED ORDER — ARTIFICIAL TEARS OP OINT
TOPICAL_OINTMENT | OPHTHALMIC | Status: DC | PRN
  Administered 2013-04-29: 1 via OPHTHALMIC

## 2013-04-29 MED ORDER — PHENYLEPHRINE 40 MCG/ML (10ML) SYRINGE FOR IV PUSH (FOR BLOOD PRESSURE SUPPORT)
PREFILLED_SYRINGE | INTRAVENOUS | Status: AC
  Filled 2013-04-29: qty 10

## 2013-04-29 MED ORDER — ALUM & MAG HYDROXIDE-SIMETH 200-200-20 MG/5ML PO SUSP: 15.0000 mL | ORAL | Status: DC | PRN

## 2013-04-29 MED ORDER — ROCURONIUM BROMIDE 100 MG/10ML IV SOLN
INTRAVENOUS | Status: DC | PRN
  Administered 2013-04-29: 40 mg via INTRAVENOUS
  Administered 2013-04-29: 20 mg via INTRAVENOUS

## 2013-04-29 MED ORDER — ROCURONIUM BROMIDE 50 MG/5ML IV SOLN
INTRAVENOUS | Status: AC
  Filled 2013-04-29: qty 1

## 2013-04-29 MED ORDER — ARTIFICIAL TEARS OP OINT
TOPICAL_OINTMENT | OPHTHALMIC | Status: AC
  Filled 2013-04-29: qty 3.5

## 2013-04-29 MED ORDER — PROTAMINE SULFATE 10 MG/ML IV SOLN
INTRAVENOUS | Status: DC | PRN
  Administered 2013-04-29: 10 mg via INTRAVENOUS
  Administered 2013-04-29: 20 mg via INTRAVENOUS
  Administered 2013-04-29 (×2): 10 mg via INTRAVENOUS

## 2013-04-29 MED ORDER — MORPHINE SULFATE 2 MG/ML IJ SOLN
2.0000 mg | INTRAMUSCULAR | Status: DC | PRN
Start: 2013-04-29 — End: 2013-05-02

## 2013-04-29 MED ORDER — FENTANYL CITRATE 0.05 MG/ML IJ SOLN
INTRAMUSCULAR | Status: AC
  Filled 2013-04-29: qty 5

## 2013-04-29 MED ORDER — PROPOFOL 10 MG/ML IV BOLUS
INTRAVENOUS | Status: AC
  Filled 2013-04-29: qty 20

## 2013-04-29 MED ORDER — INSULIN ASPART 100 UNIT/ML ~~LOC~~ SOLN
0.0000 [IU] | Freq: Three times a day (TID) | SUBCUTANEOUS | Status: DC
Start: 2013-04-29 — End: 2013-05-02
  Administered 2013-04-30: 2 [IU] via SUBCUTANEOUS

## 2013-04-29 MED ORDER — HEPARIN SODIUM (PORCINE) 1000 UNIT/ML IJ SOLN
INTRAMUSCULAR | Status: DC | PRN
  Administered 2013-04-29: 6 mL via INTRAVENOUS

## 2013-04-29 MED ORDER — NEOSTIGMINE METHYLSULFATE 1 MG/ML IJ SOLN
INTRAMUSCULAR | Status: AC
  Filled 2013-04-29: qty 20

## 2013-04-29 MED ORDER — DEXTROSE 5 % IV SOLN
1.5000 g | Freq: Two times a day (BID) | INTRAVENOUS | Status: AC
  Administered 2013-04-29 – 2013-04-30 (×2): 1.5 g via INTRAVENOUS
  Filled 2013-04-29 (×2): qty 1.5

## 2013-04-29 MED ORDER — SODIUM CHLORIDE 0.9 % IV SOLN: 500.0000 mL | Freq: Once | INTRAVENOUS | Status: AC | PRN

## 2013-04-29 MED ORDER — GUAIFENESIN-DM 100-10 MG/5ML PO SYRP: 15.0000 mL | ORAL_SOLUTION | ORAL | Status: DC | PRN

## 2013-04-29 MED ORDER — ASPIRIN 325 MG PO TABS
325.0000 mg | ORAL_TABLET | Freq: Every day | ORAL | Status: DC
  Administered 2013-04-30 – 2013-05-02 (×3): 325 mg via ORAL
  Filled 2013-04-29 (×3): qty 1

## 2013-04-29 MED ORDER — MIDAZOLAM HCL 2 MG/2ML IJ SOLN
INTRAMUSCULAR | Status: AC
  Filled 2013-04-29: qty 2

## 2013-04-29 MED ORDER — SODIUM CHLORIDE 0.9 % IV SOLN
INTRAVENOUS | Status: DC
  Administered 2013-04-29: 12:00:00 via INTRAVENOUS

## 2013-04-29 MED ORDER — PROTAMINE SULFATE 10 MG/ML IV SOLN
INTRAVENOUS | Status: AC
  Filled 2013-04-29: qty 5

## 2013-04-29 MED ORDER — ALBUTEROL SULFATE HFA 108 (90 BASE) MCG/ACT IN AERS
INHALATION_SPRAY | RESPIRATORY_TRACT | Status: DC | PRN
  Administered 2013-04-29: 6 via RESPIRATORY_TRACT

## 2013-04-29 MED ORDER — ACETAMINOPHEN 325 MG PO TABS: 325.0000 mg | ORAL_TABLET | ORAL | Status: DC | PRN

## 2013-04-29 MED ORDER — DOCUSATE SODIUM 100 MG PO CAPS
100.0000 mg | ORAL_CAPSULE | Freq: Every day | ORAL | Status: DC
Start: 2013-04-30 — End: 2013-05-02
  Administered 2013-04-30 – 2013-05-02 (×3): 100 mg via ORAL
  Filled 2013-04-29 (×3): qty 1

## 2013-04-29 MED ORDER — ONDANSETRON HCL 4 MG/2ML IJ SOLN: 4.0000 mg | Freq: Four times a day (QID) | INTRAMUSCULAR | Status: DC | PRN

## 2013-04-29 MED ORDER — HEPARIN SODIUM (PORCINE) 1000 UNIT/ML IJ SOLN
INTRAMUSCULAR | Status: AC
  Filled 2013-04-29: qty 1

## 2013-04-29 MED ORDER — PHENYLEPHRINE HCL 10 MG/ML IJ SOLN
INTRAMUSCULAR | Status: DC | PRN
  Administered 2013-04-29 (×3): 80 ug via INTRAVENOUS

## 2013-04-29 MED ORDER — 0.9 % SODIUM CHLORIDE (POUR BTL) OPTIME
TOPICAL | Status: DC | PRN
  Administered 2013-04-29: 1000 mL

## 2013-04-29 MED ORDER — LACTATED RINGERS IV SOLN
INTRAVENOUS | Status: DC | PRN
  Administered 2013-04-29: 08:00:00 via INTRAVENOUS

## 2013-04-29 MED ORDER — ALBUTEROL SULFATE HFA 108 (90 BASE) MCG/ACT IN AERS
INHALATION_SPRAY | RESPIRATORY_TRACT | Status: AC
Start: 2013-04-29 — End: 2013-04-29
  Filled 2013-04-29: qty 6.7

## 2013-04-29 MED ORDER — OXYCODONE HCL 5 MG PO TABS
5.0000 mg | ORAL_TABLET | ORAL | Status: DC | PRN
  Administered 2013-04-29: 5 mg via ORAL
  Filled 2013-04-29: qty 1
  Filled 2013-04-29: qty 2

## 2013-04-29 MED ORDER — METFORMIN HCL 500 MG PO TABS
500.0000 mg | ORAL_TABLET | Freq: Every day | ORAL | Status: DC
  Filled 2013-04-29: qty 1

## 2013-04-29 MED ORDER — DOPAMINE-DEXTROSE 3.2-5 MG/ML-% IV SOLN
3.0000 ug/kg/min | INTRAVENOUS | Status: DC
  Filled 2013-04-29: qty 250

## 2013-04-29 MED ORDER — STERILE WATER FOR INJECTION IJ SOLN
INTRAMUSCULAR | Status: AC
  Filled 2013-04-29: qty 10

## 2013-04-29 MED ORDER — NEOSTIGMINE METHYLSULFATE 1 MG/ML IJ SOLN
INTRAMUSCULAR | Status: DC | PRN
Start: 2013-04-29 — End: 2013-04-29
  Administered 2013-04-29: 4 mg via INTRAVENOUS

## 2013-04-29 MED ORDER — IOHEXOL 300 MG/ML  SOLN
INTRAMUSCULAR | Status: DC | PRN
  Administered 2013-04-29: 50 mL via INTRAVENOUS

## 2013-04-29 MED ORDER — PANTOPRAZOLE SODIUM 40 MG PO TBEC
40.0000 mg | DELAYED_RELEASE_TABLET | Freq: Every day | ORAL | Status: DC
  Administered 2013-04-29 – 2013-05-01 (×3): 40 mg via ORAL
  Filled 2013-04-29: qty 1

## 2013-04-29 MED ORDER — GLYCOPYRROLATE 0.2 MG/ML IJ SOLN
INTRAMUSCULAR | Status: AC
  Filled 2013-04-29: qty 3

## 2013-04-29 MED ORDER — PHENOL 1.4 % MT LIQD
1.0000 | OROMUCOSAL | Status: DC | PRN
  Filled 2013-04-29: qty 177

## 2013-04-29 MED ORDER — ACETAMINOPHEN 650 MG RE SUPP: 325.0000 mg | RECTAL | Status: DC | PRN

## 2013-04-29 MED ORDER — LABETALOL HCL 5 MG/ML IV SOLN
10.0000 mg | INTRAVENOUS | Status: DC | PRN
  Filled 2013-04-29: qty 4

## 2013-04-29 MED ORDER — PROPOFOL 10 MG/ML IV BOLUS
INTRAVENOUS | Status: DC | PRN
  Administered 2013-04-29: 70 mg via INTRAVENOUS

## 2013-04-29 MED ORDER — HEPARIN SODIUM (PORCINE) 5000 UNIT/ML IJ SOLN
INTRAMUSCULAR | Status: DC | PRN
  Administered 2013-04-29: 08:00:00

## 2013-04-29 MED ORDER — ONDANSETRON HCL 4 MG/2ML IJ SOLN
INTRAMUSCULAR | Status: DC | PRN
  Administered 2013-04-29: 4 mg via INTRAVENOUS

## 2013-04-29 MED ORDER — EPHEDRINE SULFATE 50 MG/ML IJ SOLN
INTRAMUSCULAR | Status: AC
  Filled 2013-04-29: qty 1

## 2013-04-29 MED ORDER — HYDRALAZINE HCL 20 MG/ML IJ SOLN: 10.0000 mg | INTRAMUSCULAR | Status: DC | PRN

## 2013-04-29 MED ORDER — ONDANSETRON HCL 4 MG/2ML IJ SOLN
INTRAMUSCULAR | Status: AC
  Filled 2013-04-29: qty 2

## 2013-04-29 MED ORDER — LIDOCAINE HCL (CARDIAC) 20 MG/ML IV SOLN
INTRAVENOUS | Status: DC | PRN
  Administered 2013-04-29: 40 mg via INTRAVENOUS

## 2013-04-29 SURGICAL SUPPLY — 58 items
BANDAGE ESMARK 6X9 LF (GAUZE/BANDAGES/DRESSINGS) IMPLANT
BANDAGE GAUZE ELAST BULKY 4 IN (GAUZE/BANDAGES/DRESSINGS) ×2 IMPLANT
BNDG ESMARK 6X9 LF (GAUZE/BANDAGES/DRESSINGS)
BOOT SUTURE AID YELLOW STND (SUTURE) IMPLANT
CANISTER SUCTION 2500CC (MISCELLANEOUS) ×2 IMPLANT
CLIP TI MEDIUM 24 (CLIP) ×2 IMPLANT
CLIP TI WIDE RED SMALL 24 (CLIP) ×2 IMPLANT
COVER SURGICAL LIGHT HANDLE (MISCELLANEOUS) ×2 IMPLANT
DECANTER SPIKE VIAL GLASS SM (MISCELLANEOUS) IMPLANT
DERMABOND ADHESIVE PROPEN (GAUZE/BANDAGES/DRESSINGS) ×1
DERMABOND ADVANCED (GAUZE/BANDAGES/DRESSINGS) ×1
DERMABOND ADVANCED .7 DNX12 (GAUZE/BANDAGES/DRESSINGS) ×1 IMPLANT
DERMABOND ADVANCED .7 DNX6 (GAUZE/BANDAGES/DRESSINGS) ×1 IMPLANT
DRAIN SNY 10X20 3/4 PERF (WOUND CARE) IMPLANT
DRAPE WARM FLUID 44X44 (DRAPE) ×2 IMPLANT
DRAPE X-RAY CASS 24X20 (DRAPES) ×2 IMPLANT
ELECT REM PT RETURN 9FT ADLT (ELECTROSURGICAL) ×2
ELECTRODE REM PT RTRN 9FT ADLT (ELECTROSURGICAL) ×1 IMPLANT
EVACUATOR SILICONE 100CC (DRAIN) IMPLANT
GLOVE BIO SURGEON STRL SZ 6.5 (GLOVE) ×14 IMPLANT
GLOVE BIOGEL PI IND STRL 6.5 (GLOVE) ×1 IMPLANT
GLOVE BIOGEL PI INDICATOR 6.5 (GLOVE) ×1
GLOVE SKINSENSE NS SZ7.0 (GLOVE) ×1
GLOVE SKINSENSE STRL SZ7.0 (GLOVE) ×1 IMPLANT
GLOVE SS BIOGEL STRL SZ 7 (GLOVE) ×1 IMPLANT
GLOVE SUPERSENSE BIOGEL SZ 7 (GLOVE) ×1
GOWN STRL REUS W/ TWL LRG LVL3 (GOWN DISPOSABLE) ×5 IMPLANT
GOWN STRL REUS W/TWL LRG LVL3 (GOWN DISPOSABLE) ×10
GRAFT PROPATEN THIN WALL 6X80 (Vascular Products) ×2 IMPLANT
INSERT FOGARTY SM (MISCELLANEOUS) ×2 IMPLANT
KIT BASIN OR (CUSTOM PROCEDURE TRAY) ×2 IMPLANT
KIT ROOM TURNOVER OR (KITS) ×2 IMPLANT
NS IRRIG 1000ML POUR BTL (IV SOLUTION) ×4 IMPLANT
PACK PERIPHERAL VASCULAR (CUSTOM PROCEDURE TRAY) ×2 IMPLANT
PAD ARMBOARD 7.5X6 YLW CONV (MISCELLANEOUS) ×4 IMPLANT
PADDING CAST COTTON 6X4 STRL (CAST SUPPLIES) IMPLANT
PUNCH AORTIC ROTATE 5MM 8IN (MISCELLANEOUS) ×2 IMPLANT
SET COLLECT BLD 21X3/4 12 (NEEDLE) ×2 IMPLANT
SPONGE GAUZE 4X4 12PLY (GAUZE/BANDAGES/DRESSINGS) ×2 IMPLANT
STOPCOCK 4 WAY LG BORE MALE ST (IV SETS) ×2 IMPLANT
SUT PROLENE 6 0 BV (SUTURE) ×4 IMPLANT
SUT PROLENE 6 0 CC (SUTURE) ×4 IMPLANT
SUT PROLENE 7 0 BV 1 (SUTURE) IMPLANT
SUT PROLENE 7 0 BV1 MDA (SUTURE) IMPLANT
SUT SILK 2 0 SH (SUTURE) ×2 IMPLANT
SUT SILK 3 0 (SUTURE)
SUT SILK 3-0 18XBRD TIE 12 (SUTURE) IMPLANT
SUT VIC AB 2-0 CTX 36 (SUTURE) ×4 IMPLANT
SUT VIC AB 3-0 SH 27 (SUTURE) ×2
SUT VIC AB 3-0 SH 27X BRD (SUTURE) ×2 IMPLANT
SUT VICRYL 4-0 PS2 18IN ABS (SUTURE) ×2 IMPLANT
TAPE CLOTH SURG 4X10 WHT LF (GAUZE/BANDAGES/DRESSINGS) ×2 IMPLANT
TOWEL OR 17X24 6PK STRL BLUE (TOWEL DISPOSABLE) ×4 IMPLANT
TOWEL OR 17X26 10 PK STRL BLUE (TOWEL DISPOSABLE) ×4 IMPLANT
TRAY FOLEY CATH 16FRSI W/METER (SET/KITS/TRAYS/PACK) ×2 IMPLANT
TUBING EXTENTION W/L.L. (IV SETS) ×2 IMPLANT
UNDERPAD 30X30 INCONTINENT (UNDERPADS AND DIAPERS) ×2 IMPLANT
WATER STERILE IRR 1000ML POUR (IV SOLUTION) ×2 IMPLANT

## 2013-04-29 NOTE — Preoperative (Signed)
Beta Blockers   Reason not to administer Beta Blockers:Not Applicable 

## 2013-04-29 NOTE — Op Note (Signed)
OPERATIVE REPORT  Date of Surgery: 04/28/2013 - 04/29/2013  Surgeon: [name], MD  Assistant: Dionicio Stall Pre-op Diagnosis: Gangrenous toes, ischemia right leg with right superficial femoral occlusion and tibial occlusive disease  Post-op Diagnosis  same Procedure: Procedure(s):  Right femoral-popliteal bypass graft (above-knee) using 6 mm propaten Gore-Tex graft with INTRA OPERATIVE ARTERIOGRAM  Anesthesia: General  EBL: 678 cc  Complications: None  Procedure Details: The patient to the operating room placed in supine position at which time satisfactory general endotracheal anesthesia was administered. The   right leg was prepped with Betadine scrub and solution draped in a routine sterile manner. The SonoSite ultrasound was then utilized to was visualized the greater saphenous vein. It was patent in the thigh of borderline size but very thickened intraluminally and not felt to be a satisfactory conduit. Patient did have an above-the-knee patent popliteal artery it was decided to use PTFE-cortex tibia the knee popliteal segment. Incision was made through the right inguinal crease previous scar were right-to-left femoral-femoral graft had been placed previously. Right-to-left femoral-femoral graft was dissected free adnexal and pulse. Superficial femoral artery was small but patent. Medial incision was made above the knee carried down to subcutaneous tissue and the popliteal artery and vein were then exposed in the above-knee position. The artery was of good size and relatively soft with some disease posteriorly but widely patent on angiogram with one-vessel runoff to the posterior tibial artery. Subfascial anatomic tunnel was created 6 mm Gore-Tex-propaten graft was delivered through the tunnel and the patient was heparinized. Operative arteries and occluded proximally and distally a 15 blade extended with Potts scissors. It would easily accept a 4-1/2 mm dilator. Gore-Tex was spatulated  and anastomosed end to side with 6-0 Prolene. Attention turned to the inguinal area where the vessels were occluded with vascular clamps. A location on the inferior aspect of the femoral-femoral graft was selected it was 11 blade large 5 mm punch cortex slightly spatulated and anastomosed inside with 6-0 Prolene. Clamps were then released there was excellent pulse and excellent Doppler flow in the popliteal artery below the graft and brisk posterior tibial flow at the ankle. Protamine was given to reverse the heparin. Intraoperative arteriogram was performed which revealed widely patent anastomosis with one-vessel runoff to the posterior tibial artery with a very diseased peroneal artery. Adequate hemostasis was achieved wounds closed in layers with Vicryl in subcuticular fashion Dermabond patient taken to the recovery room in stable condition  [name], MD 04/29/2013 10:39 AM

## 2013-04-29 NOTE — Evaluation (Signed)
Physical Therapy Evaluation Patient Details Name: [name] MRN: [id number] DOB: 1934-07-05 Today's Date: 04/29/2013 Time: 0630-1601 PT Time Calculation (min): 20 min  PT Assessment / Plan / Recommendation History of Present Illness  Gangrenous toes, ischemia right leg with right superficial femoral occlusion and tibial occlusive disease.  S/p R fem-pop BPG.  Clinical Impression  Pt admitted with ischemic toes on R s/p fem-pop BPG.  Pt currently limited functionally due to the problems listed below.  (see problems list.)  Pt will benefit from PT to maximize function and safety to be able to get home safely with available assist of wife and family.     PT Assessment  Patient needs continued PT services    Follow Up Recommendations  Home health PT;No PT follow up (depending on progress and family's comfort level)    Does the patient have the potential to tolerate intense rehabilitation      Barriers to Discharge        Equipment Recommendations   (TBD whether needs RW)    Recommendations for Other Services     Frequency Min 3X/week    Precautions / Restrictions Precautions Precautions: Fall   Pertinent Vitals/Pain       Mobility  Bed Mobility Overal bed mobility: Needs Assistance Bed Mobility: Supine to Sit;Sit to Supine Supine to sit: Supervision Sit to supine: Supervision General bed mobility comments: cues for guidance; no assist needed; pt even scooted up to [facility] Transfers Overall transfer level: Needs assistance Transfers: Sit to/from Stand Sit to Stand: Min guard General transfer comment: cues for safety, no assist Ambulation/Gait Ambulation/Gait assistance: Min guard Ambulation Distance (Feet): 180 Feet Assistive device: Rolling walker (2 wheeled) Gait Pattern/deviations: Step-through pattern;Antalgic;Trunk flexed Gait velocity: slower Gait velocity interpretation: Below normal speed for age/gender    Exercises     PT Diagnosis: Acute pain  PT  Problem List: Decreased strength;Decreased activity tolerance;Decreased balance;Decreased mobility;Pain PT Treatment Interventions: DME instruction;Gait training;Stair training;Functional mobility training;Therapeutic activities;Patient/family education     PT Goals(Current goals can be found in the care plan section) Acute Rehab PT Goals Patient Stated Goal: get home PT Goal Formulation: With patient/family Time For Goal Achievement: 05/06/13 Potential to Achieve Goals: Good  Visit Information  Last PT Received On: 04/29/13 Assistance Needed: +1 History of Present Illness: Gangrenous toes, ischemia right leg with right superficial femoral occlusion and tibial occlusive disease.  S/p R fem-pop BPG.       Prior [place] expects to be discharged to:: Private residence Living Arrangements: Spouse/significant other;Other relatives Available Help at Discharge: Family Type of Home: House Home Access: Stairs to enter [employer] of Steps: 4 Entrance Stairs-Rails: Right Home Layout: One level Home Equipment: Bedside commode;Wheelchair - manual Additional Comments: WC borrowed from church Prior Function Level of Independence: Needs assistance ADL's / Homemaking Assistance Needed: aide comes in to bathe/dress patient Communication Communication: No difficulties    Cognition  Cognition Arousal/Alertness: Awake/alert Behavior During Therapy: WFL for tasks assessed/performed Overall Cognitive Status: Within Functional Limits for tasks assessed    Extremity/Trunk Assessment Lower Extremity Assessment Lower Extremity Assessment: Overall WFL for tasks assessed (R painful enough not to be able to exert normal force.)   Balance Balance Overall balance assessment: Needs assistance Sitting-balance support: No upper extremity supported;Feet supported Sitting balance-Leahy Scale: Good Standing balance support: Bilateral upper extremity  supported;During functional activity Standing balance-Leahy Scale: Fair  End of Session PT - End of Session Activity Tolerance: Patient tolerated treatment well Patient left: in  bed;with family/visitor present;with call bell/phone within reach Nurse Communication: Mobility status  GP     Ericson Nafziger, Tessie Fass 04/29/2013, 4:47 PM 04/29/2013  Donnella Sham, PT [phone]  (pager)

## 2013-04-29 NOTE — Interval H&P Note (Signed)
History and Physical Interval Note:  04/29/2013 8:20 AM  [name]  has presented today for surgery, with the diagnosis of Gangrenous toes, ischemia right leg  The various methods of treatment have been discussed with the patient and family. After consideration of risks, benefits and other options for treatment, the patient has consented to  Procedure(s): BYPASS GRAFT FEMORAL-POPLITEAL ARTERY (Right) as a surgical intervention .  The patient's history has been reviewed, patient examined, no change in status, stable for surgery.  I have reviewed the patient's chart and labs.  Questions were answered to the patient's satisfaction.     [name]

## 2013-04-29 NOTE — Progress Notes (Signed)
Utilization Review Completed.Donne Anon T2/06/2013

## 2013-04-29 NOTE — Anesthesia Postprocedure Evaluation (Signed)
  Anesthesia Post-op Note  Patient: [name]  Procedure(s) Performed: Procedure(s):  FEMORAL-POPLITEAL ARTERY Bypass Graft with intraoperative ultrasound and arteriogarm (Right) INTRA OPERATIVE ARTERIOGRAM (Right)  Patient Location: PACU  Anesthesia Type:General  Level of Consciousness: awake, alert  and patient cooperative  Airway and Oxygen Therapy: Patient Spontanous Breathing and Patient connected to nasal cannula oxygen  Post-op Pain: none  Post-op Assessment: Post-op Vital signs reviewed, Patient's Cardiovascular Status Stable, Respiratory Function Stable, Patent Airway, No signs of Nausea or vomiting and Pain level controlled  Post-op Vital Signs: Reviewed and stable  Complications: No apparent anesthesia complications

## 2013-04-29 NOTE — Transfer of Care (Signed)
Immediate Anesthesia Transfer of Care Note  Patient: [name]  Procedure(s) Performed: Procedure(s):  FEMORAL-POPLITEAL ARTERY Bypass Graft with intraoperative ultrasound and arteriogarm (Right) INTRA OPERATIVE ARTERIOGRAM (Right)  Patient Location: PACU  Anesthesia Type:General  Level of Consciousness: awake and alert   Airway & Oxygen Therapy: Patient Spontanous Breathing and Patient connected to face mask oxygen  Post-op Assessment: Report given to PACU RN, Post -op Vital signs reviewed and stable and Patient moving all extremities X 4  Post vital signs: Reviewed and stable  Complications: No apparent anesthesia complications

## 2013-04-29 NOTE — Anesthesia Preprocedure Evaluation (Addendum)
Anesthesia Evaluation  Patient identified by MRN, date of birth, ID band Patient awake    Reviewed: Allergy & Precautions, H&P , NPO status , Patient's Chart, lab work & pertinent test results  History of Anesthesia Complications Negative for: history of anesthetic complications  Airway Mallampati: I TM Distance: >3 FB Neck ROM: Full    Dental  (+) Edentulous Upper and Edentulous Lower   Pulmonary COPD COPD inhaler, former smoker (quit 20 years),  breath sounds clear to auscultation  Pulmonary exam normal       Cardiovascular hypertension, Pt. on medications + Peripheral Vascular Disease Rhythm:Regular Rate:Normal  12/14 ECHO: EF 45-50%, inferior hypokinesis '09 cath: non-obstructive   Neuro/Psych Depression dementia    GI/Hepatic negative GI ROS, Neg liver ROS,   Endo/Other  diabetes, Oral Hypoglycemic Agents  Renal/GU negative Renal ROS     Musculoskeletal   Abdominal   Peds  Hematology  (+) Blood dyscrasia (Hb 8.8), anemia ,   Anesthesia Other Findings   Reproductive/Obstetrics                         Anesthesia Physical Anesthesia Plan  ASA: III  Anesthesia Plan: General   Post-op Pain Management:    Induction: Intravenous  Airway Management Planned: Oral ETT  Additional Equipment:   Intra-op Plan:   Post-operative Plan: Extubation in OR  Informed Consent: I have reviewed the patients History and Physical, chart, labs and discussed the procedure including the risks, benefits and alternatives for the proposed anesthesia with the patient or authorized representative who has indicated his/her understanding and acceptance.   Consent reviewed with POA  Plan Discussed with: CRNA and Surgeon  Anesthesia Plan Comments: (Plan routine monitors, GETA)       Anesthesia Quick Evaluation

## 2013-04-29 NOTE — Anesthesia Procedure Notes (Signed)
Procedure Name: Intubation Date/Time: 04/29/2013 8:39 AM Performed by: Ollen Bowl Pre-anesthesia Checklist: Patient identified, Timeout performed, Emergency Drugs available, Suction available and Patient being monitored Patient Re-evaluated:Patient Re-evaluated prior to inductionOxygen Delivery Method: Circle system utilized and Simple face mask Preoxygenation: Pre-oxygenation with 100% oxygen Intubation Type: IV induction Ventilation: Mask ventilation without difficulty Laryngoscope Size: Miller and 3 Grade View: Grade I Tube type: Oral Tube size: 7.5 mm Number of attempts: 1 Airway Equipment and Method: Patient positioned with wedge pillow and Stylet Placement Confirmation: ETT inserted through vocal cords under direct vision,  positive ETCO2 and breath sounds checked- equal and bilateral Secured at: 22 cm Tube secured with: Tape Dental Injury: Teeth and Oropharynx as per pre-operative assessment

## 2013-04-30 DIAGNOSIS — Z48812 Encounter for surgical aftercare following surgery on the circulatory system: Secondary | ICD-10-CM

## 2013-04-30 LAB — BASIC METABOLIC PANEL
BUN: 10 mg/dL (ref 6–23)
CALCIUM: 8.5 mg/dL (ref 8.4–10.5)
CO2: 23 meq/L (ref 19–32)
Chloride: 106 mEq/L (ref 96–112)
Creatinine, Ser: 0.98 mg/dL (ref 0.50–1.35)
GFR calc Af Amer: >90 mL/min (ref >90)
GFR calc non Af Amer: 78 mL/min — ABNORMAL LOW (ref >90)
GLUCOSE: 149 mg/dL — AB (ref 70–99)
Potassium: 4.5 mEq/L (ref 3.7–5.3)
Sodium: 141 mEq/L (ref 137–147)

## 2013-04-30 LAB — GLUCOSE, CAPILLARY
GLUCOSE-CAPILLARY: 132 mg/dL — AB (ref 70–99)
GLUCOSE-CAPILLARY: 119 mg/dL — AB (ref 70–99)
Glucose-Capillary: 91 mg/dL (ref 70–99)
Glucose-Capillary: 136 mg/dL — ABNORMAL HIGH (ref 70–99)

## 2013-04-30 LAB — URINE CULTURE: Colony Count: >=100000

## 2013-04-30 LAB — CBC
HCT: 30.4 % — ABNORMAL LOW (ref 39.0–52.0)
HEMOGLOBIN: 9.1 g/dL — AB (ref 13.0–17.0)
MCH: 22.1 pg — AB (ref 26.0–34.0)
MCHC: 29.9 g/dL — ABNORMAL LOW (ref 30.0–36.0)
MCV: 74 fL — ABNORMAL LOW (ref 78.0–100.0)
PLATELETS: 338 10*3/uL (ref 150–400)
RBC: 4.11 MIL/uL — AB (ref 4.22–5.81)
RDW: 18.7 % — ABNORMAL HIGH (ref 11.5–15.5)
WBC: 10.7 10*3/uL — ABNORMAL HIGH (ref 4.0–10.5)

## 2013-04-30 MED ORDER — OXYCODONE HCL 5 MG PO TABS: 5.0000 mg | ORAL_TABLET | Freq: Four times a day (QID) | ORAL | Status: DC | PRN

## 2013-04-30 NOTE — Progress Notes (Signed)
Physical Therapy Treatment Patient Details Name: [name] MRN: [id number] DOB: 20-Mar-1935 Today's Date: 04/30/2013 Time: 0998-3382 PT Time Calculation (min): 20 min  PT Assessment / Plan / Recommendation  History of Present Illness Gangrenous toes, ischemia right leg with right superficial femoral occlusion and tibial occlusive disease.  S/p R fem-pop BPG.   PT Comments   Progressing well, but still needs cuing and supervision/guard assist due to mentation.  Follow Up Recommendations  No PT follow up     Does the patient have the potential to tolerate intense rehabilitation     Barriers to Discharge        Equipment Recommendations  Other (comment) (TBA whether needs RW)    Recommendations for Other Services    Frequency Min 3X/week   Progress towards PT Goals Progress towards PT goals: Progressing toward goals  Plan Current plan remains appropriate    Precautions / Restrictions Precautions Precautions: Fall   Pertinent Vitals/Pain It a 10  ( the amount of pain), but then pt doesn't acknowledge the pain for a while after    Mobility  Bed Mobility Overal bed mobility: Needs Assistance Bed Mobility: Supine to Sit;Sit to Supine Supine to sit: Supervision Sit to supine: Supervision General bed mobility comments: cues for direction ; no assist needed Transfers Overall transfer level: Needs assistance Transfers: Sit to/from Stand Sit to Stand: Min guard General transfer comment: cues for safety, no assist Ambulation/Gait Ambulation/Gait assistance: Min guard Ambulation Distance (Feet): 200 Feet Assistive device: Rolling walker (2 wheeled) Gait Pattern/deviations: Step-through pattern;Scissoring;Trunk flexed Gait velocity: slower Gait velocity interpretation: Below normal speed for age/gender    Exercises     PT Diagnosis:    PT Problem List:   PT Treatment Interventions:     PT Goals (current goals can now be found in the care plan section) Acute Rehab PT  Goals PT Goal Formulation: With patient/family Time For Goal Achievement: 05/06/13 Potential to Achieve Goals: Good  Visit Information  Last PT Received On: 04/30/13 Assistance Needed: +1 History of Present Illness: Gangrenous toes, ischemia right leg with right superficial femoral occlusion and tibial occlusive disease.  S/p R fem-pop BPG.    Subjective Data  Subjective: What's this hurting for.Marland KitchenMarland KitchenMarland KitchenI can't understand why it's hurting   Cognition  Cognition Arousal/Alertness: Awake/alert Behavior During Therapy: WFL for tasks assessed/performed Overall Cognitive Status: History of cognitive impairments - at baseline Memory: Decreased short-term memory    Balance  Balance Overall balance assessment: Needs assistance Sitting balance-Leahy Scale: Good Standing balance support: Bilateral upper extremity supported Standing balance-Leahy Scale: Fair  End of Session PT - End of Session Activity Tolerance: Patient tolerated treatment well Patient left: with call bell/phone within reach;in chair Nurse Communication: Mobility status   GP     Shalinda Burkholder, Tessie Fass 04/30/2013, 4:10 PM

## 2013-04-30 NOTE — Progress Notes (Signed)
[facility] Progress Note  04/30/2013 8:38 AM 1 Day Post-Op  Subjective:  Patient feeling well today. Has minimal pain that is well-controlled. Has been ambulating to the restroom.   Tmax 99 BP sys 131-158 HR 70s-80s O2 100% RA  Filed Vitals:   04/30/13 0443  BP: 131/76  Pulse: 93  Temp: 98.4 F (36.9 C)  Resp: 16    Physical Exam: Incisions: Right groin and thigh incisions clean, dry and intact Extremities: Pulses non-palpable bilaterally. Right DP/PT doppler signals brisk. Gangrenous right 3rd toe. Minor bleeding from right 5th toe. Left anterior tib and peroneal doppler signals faint. Left foot is warm without lesions.  Neuro: sensation and  control intact lower extremities bilaterally.   CBC    Component Value Date/Time   WBC 10.7* 04/30/2013 0107   RBC 4.11* 04/30/2013 0107   HGB 9.1* 04/30/2013 0107   HCT 30.4* 04/30/2013 0107   PLT 338 04/30/2013 0107   MCV 74.0* 04/30/2013 0107   MCH 22.1* 04/30/2013 0107   MCHC 29.9* 04/30/2013 0107   RDW 18.7* 04/30/2013 0107   LYMPHSABS 1.8 04/17/2013 1813   MONOABS 0.7 04/17/2013 1813   EOSABS 0.7 04/17/2013 1813   BASOSABS 0.1 04/17/2013 1813    BMET    Component Value Date/Time   NA 141 04/30/2013 0107   K 4.5 04/30/2013 0107   CL 106 04/30/2013 0107   CO2 23 04/30/2013 0107   GLUCOSE 149* 04/30/2013 0107   BUN 10 04/30/2013 0107   CREATININE 0.98 04/30/2013 0107   CALCIUM 8.5 04/30/2013 0107   GFRNONAA 78* 04/30/2013 0107   GFRAA >90 04/30/2013 0107    INR    Component Value Date/Time   INR 1.00 04/28/2013 1127     Intake/Output Summary (Last 24 hours) at 04/30/13 0838 Last data filed at 04/30/13 0600  Gross per 24 hour  Intake 3004.66 ml  Output   1110 ml  Net 1894.66 ml     Assessment:  79 y.o. male is s/p Right femoral-popliteal bypass graft (above-knee) using 6 mm propaten Gore-Tex graft with INTRA OPERATIVE ARTERIOGRAM  1 Day Post-Op  Plan: -Continue pain control.  -PT/OT -Good UOP. -post-op ABIs pending;  doppler signals not strong on left side, re-evaluate left extremity.  -DVT prophylaxis:  Lovenox   Virgina Jock, PA-S [facility] [phone] 04/30/2013 8:38 AM   Agree with above assessment ABI on the right 0.65 left 0.46 2+ popliteal pulse palpable on the right with brisk Doppler flow PT-getting daily dressing changes for right foot Beginning ambulation in halls.  Plan DC the and continue to mobilize patient up to DC home by Saturday

## 2013-04-30 NOTE — Evaluation (Signed)
Occupational Therapy Evaluation Patient Details Name: [name] MRN: [id number] DOB: 12-25-1934 Today's Date: 04/30/2013 Time: 7829-5621 OT Time Calculation (min): 30 min  OT Assessment / Plan / Recommendation History of present illness Gangrenous toes, ischemia right leg with right superficial femoral occlusion and tibial occlusive disease.  S/p R fem-pop BPG.   Clinical Impression   Pt presents with impaired mobility and balance following the above procedure, but overall is moving well.  Pt relies on a caregiver for assist with bathing and dressing, but takes himself to the bathroom normally.  Wife would like to practice simulated tub transfer and toilet transfers prior to return home.  Will follow.    OT Assessment  Patient needs continued OT Services    Follow Up Recommendations  No OT follow up    Barriers to Discharge      Equipment Recommendations  None recommended by OT    Recommendations for Other Services    Frequency  Min 2X/week    Precautions / Restrictions Precautions Precautions: Fall   Pertinent Vitals/Pain VSS, no pain    ADL  Eating/Feeding: Set up Where Assessed - Eating/Feeding: Bed level Grooming: Wash/dry hands;Wash/dry face;Supervision/safety Where Assessed - Grooming: Unsupported sitting Upper Body Bathing: Minimal assistance Where Assessed - Upper Body Bathing: Unsupported sitting Lower Body Bathing: Maximal assistance Where Assessed - Lower Body Bathing: Unsupported sitting;Supported sit to stand Upper Body Dressing: Minimal assistance Where Assessed - Upper Body Dressing: Unsupported sitting Lower Body Dressing: Maximal assistance Where Assessed - Lower Body Dressing: Unsupported sitting;Supported sit to stand Equipment Used: Rolling walker Transfers/Ambulation Related to ADLs: did not transfer pt, left for vascular test ADL Comments: Pt is dependent at baseline on aide for bathing an dressing. Will need to practice toilet transfer and tub  transfer.    OT Diagnosis: Generalized weakness;Cognitive deficits  OT Problem List: Decreased strength;Impaired balance (sitting and/or standing);Decreased cognition OT Treatment Interventions: Self-care/ADL training;DME and/or AE instruction;Patient/family education;Balance training   OT Goals(Current goals can be found in the care plan section) Acute Rehab OT Goals Patient Stated Goal: get home OT Goal Formulation: With patient Time For Goal Achievement: 05/07/13 Potential to Achieve Goals: Good ADL Goals Pt Will Transfer to Toilet: with supervision;ambulating;regular height toilet Pt Will Perform Toileting - Clothing Manipulation and hygiene: with supervision;sit to/from stand Pt Will Perform Tub/Shower Transfer: Tub transfer;with supervision;ambulating;shower seat;rolling walker  Visit Information  Last OT Received On: 04/30/13 Assistance Needed: +1 History of Present Illness: Gangrenous toes, ischemia right leg with right superficial femoral occlusion and tibial occlusive disease.  S/p R fem-pop BPG.       Prior [place] expects to be discharged to:: Private residence Living Arrangements: Spouse/significant other;Other relatives Available Help at Discharge: Family;Personal care attendant Type of Home: House Home Access: Stairs to enter Entrance Stairs-Number of Steps: 4 Entrance Stairs-Rails: Right Home Layout: One level Home Equipment: Bedside commode;Wheelchair - [employer] - 2 wheels;Shower seat Additional Comments: WC borrowed from church Prior Function Level of Independence: Needs Licensed conveyancer / Transfers Assistance Needed: ambulates with RW, aide supervises tub transfer ADL's / Homemaking Assistance Needed: aide comes in to bathe/dress patient Communication Communication: No difficulties         Vision/Perception Vision - History Patient Visual Report: No change from baseline   Cognition   Cognition Arousal/Alertness: Awake/alert Behavior During Therapy: WFL for tasks assessed/performed Overall Cognitive Status: History of cognitive impairments - at baseline (hx of dementia) Memory: Decreased short-term memory    Extremity/Trunk  Assessment Upper Extremity Assessment Upper Extremity Assessment: Overall WFL for tasks assessed Lower Extremity Assessment Lower Extremity Assessment: Defer to PT evaluation     Mobility Bed Mobility Overal bed mobility: Needs Assistance Bed Mobility: Supine to Sit;Sit to Supine Supine to sit: Supervision Sit to supine: Supervision General bed mobility comments: no physical assist needed, slow Transfers Overall transfer level: Needs assistance Equipment used: Rolling walker (2 wheeled) Transfers: Sit to/from Stand Sit to Stand: Min guard     Exercise     Balance Balance Sitting balance-Leahy Scale: Good Standing balance-Leahy Scale: Fair   End of Session OT - End of Session Activity Tolerance: Patient tolerated treatment well Patient left: in bed;with call bell/phone within reach (going for test)  GO     Malka So 04/30/2013, 9:20 AM [phone]

## 2013-04-30 NOTE — Care Management Note (Unsigned)
    Page 1 of 1   04/30/2013     4:15:53 PM   CARE MANAGEMENT NOTE 04/30/2013  Patient:  [name], [name]   Account Number:  [id number]  Date Initiated:  04/30/2013  Documentation initiated by:  Bettie Swavely  Subjective/Objective Assessment:   PT S/P RT FEM POP ON 04/29/13.  PTA, PT INDEPENDENT, LIVES WITH SPOUSE.     Action/Plan:   PT/OT EVALUATIONS COMPLETED.  NO FOLLOW UP RECOMMENDED. WILL CONT TO FOLLOW FOR DC NEEDS.   Anticipated DC Date:  05/01/2013   Anticipated DC Plan:  [place]  CM consult      Choice offered to / List presented to:             Status of service:  In process, will continue to follow Medicare Important Message given?   (If response is "NO", the following Medicare IM given date fields will be blank) Date Medicare IM given:   Date Additional Medicare IM given:    Discharge Disposition:    Per UR Regulation:  Reviewed for med. necessity/level of care/duration of stay  If discussed at [place] of Stay Meetings, dates discussed:    Comments:

## 2013-04-30 NOTE — Progress Notes (Signed)
VASCULAR LAB PRELIMINARY  ARTERIAL  ABI completed:Right ABI indicates moderate reduction of arterial flow.  Left: ABI indicates severe reduction in arterial flow.    RIGHT    LEFT    PRESSURE WAVEFORM  PRESSURE WAVEFORM  BRACHIAL 135 T BRACHIAL 141 T  DP 85 DM DP 65 DM  AT   AT    PT 92 DM PT    PER   PER 60 DM  GREAT TOE  NA GREAT TOE  NA    RIGHT LEFT  ABI 0.65 0.46     Lional Icenogle, RVT 04/30/2013, 9:51 AM

## 2013-05-01 ENCOUNTER — Telehealth: Payer: Self-pay | Admitting: Vascular Surgery

## 2013-05-01 LAB — GLUCOSE, CAPILLARY
Glucose-Capillary: 127 mg/dL — ABNORMAL HIGH (ref 70–99)
Glucose-Capillary: 121 mg/dL — ABNORMAL HIGH (ref 70–99)

## 2013-05-01 NOTE — Discharge Summary (Signed)
[facility] Discharge Summary  [name] [birth year] 79 y.o. male  [id number]  Admission Date: 04/28/2013  Discharge Date: 05/02/13  Physician: Mal Misty, MD  Admission Diagnosis: PVD with gangrene Gangrenous toes, ischemia right leg   HPI:   This is a 79 y.o. male patient presents to the emergency room physician with rest pain in his lower extremity. This is worse on the right than on the left. He has a very complex past history. He does have a prior history of femoral to femoral bypass and left femoral to above-knee popliteal bypass with Gore-Tex graft. He was last seen in our office by Dr. Kellie Simmering approximately one year ago. At that time he had included his left femoropopliteal bypass which was present since 2003. He was noted to have 2-3+ femoral pulses bilaterally. He was not having any limiting claudication or rest pain and therefore was to be seen again in approximately this time this year. He did have an appointment for [place] in one week. He has had difficulty over the last several weeks with a recent admission for vomiting and some exacerbation of COPD. Does have a history of colon cancer. He has had no evidence of recurrence for 17 years. He does have dementia but does live at home. Over the past several lites he has had a worsening rest pain. His wife reports this is been present for approximately one week. He was taking Tylenol with Codeine with minimal relief. He was having some relief with Advil. He is chronically on Plavix last dose yesterday.  Hospital Course:  The patient was admitted to the hospital and taken to the operating room on 04/28/2013 - 04/29/2013 and underwent: Right femoral-popliteal bypass graft (above-knee) using 6 mm propaten Gore-Tex graft with INTRA OPERATIVE ARTERIOGRAM    The pt tolerated the procedure well and was transported to the PACU in good condition. By POD 1, he had a 2+ popliteal palpable pulses on the right with brisk doppler  flow PT.    Postoperative ABI's on 04/30/13 are as follows:  RIGHT    LEFT     PRESSURE  WAVEFORM   PRESSURE  WAVEFORM   BRACHIAL  135  T  BRACHIAL  141  T   DP  85  DM  DP  65  DM   AT    AT     PT  92  DM  PT     PER    PER  60  DM   GREAT TOE   NA  GREAT TOE   NA     RIGHT  LEFT   ABI  0.65  0.46    Pre operative ABI's were 0.43 on the right.  He is discharged on POD 3.  The remainder of the hospital course consisted of increasing mobilization and increasing intake of solids without difficulty.  CBC    Component Value Date/Time   WBC 10.7* 04/30/2013 0107   RBC 4.11* 04/30/2013 0107   HGB 9.1* 04/30/2013 0107   HCT 30.4* 04/30/2013 0107   PLT 338 04/30/2013 0107   MCV 74.0* 04/30/2013 0107   MCH 22.1* 04/30/2013 0107   MCHC 29.9* 04/30/2013 0107   RDW 18.7* 04/30/2013 0107   LYMPHSABS 1.8 04/17/2013 1813   MONOABS 0.7 04/17/2013 1813   EOSABS 0.7 04/17/2013 1813   BASOSABS 0.1 04/17/2013 1813    BMET    Component Value Date/Time   NA 141 04/30/2013 0107   K 4.5 04/30/2013 0107  CL 106 04/30/2013 0107   CO2 23 04/30/2013 0107   GLUCOSE 149* 04/30/2013 0107   BUN 10 04/30/2013 0107   CREATININE 0.98 04/30/2013 0107   CALCIUM 8.5 04/30/2013 0107   GFRNONAA 78* 04/30/2013 0107   GFRAA >90 04/30/2013 0107     Discharge Instructions:   The patient is discharged to home with extensive instructions on wound care and progressive ambulation.  They are instructed not to drive or perform any heavy lifting until returning to see the physician in his office.  Discharge Orders   Future Appointments Provider Department Dept Phone   05/12/2013 2:30 PM [facility] [place] [phone]   05/12/2013 3:00 PM [facility] [place] [phone]   05/12/2013 3:30 PM Mal Misty, MD [facility] [phone]   Future Orders Complete By Expires   Call MD for:  redness, tenderness, or signs of infection (pain, swelling, bleeding,  redness, odor or green/yellow discharge around incision site)  As directed    Call MD for:  severe or increased pain, loss or decreased feeling  in affected limb(s)  As directed    Call MD for:  temperature >100.5  As directed    Discharge instructions  As directed    Comments:     Patient is okay for return to adult center after discharge from the hospital.   Discharge wound care:  As directed    Comments:     Wash the groin wound with soap and water daily and pat dry. (No tub bath-only shower)  Then put a dry gauze or washcloth there to keep this area dry daily and as needed.  Do not use Vaseline or neosporin on your incisions.  Only use soap and water on your incisions and then protect and keep dry.   Driving Restrictions  As directed    Comments:     No driving for 2 weeks   Lifting restrictions  As directed    Comments:     No lifting for 4 weeks   Resume previous diet  As directed       Discharge Diagnosis:  PVD with gangrene Gangrenous toes, ischemia right leg  Secondary Diagnosis: Patient Active Problem List   Diagnosis Date Noted  . PAD (peripheral artery disease) 04/28/2013  . Chest pain 03/23/2013  . Nausea with vomiting 03/23/2013  . Pain in joint, ankle and foot 03/23/2013  . Dementia 07/04/2012  . URI (upper respiratory infection) 07/04/2012  . PVD (peripheral vascular disease) 04/21/2012  . Leg pain 04/18/2012  . COUGH 06/02/2007  . HYPERLIPIDEMIA 05/07/2007  . PVD 05/07/2007  . COPD 05/07/2007  . DYSPNEA 05/07/2007  . HYPERTENSION NEC 05/07/2007   Past Medical History  Diagnosis Date  . Hyperlipidemia   . PVD (peripheral vascular disease)   . Hypertension   . COPD (chronic obstructive pulmonary disease)   . Colon cancer   . Alzheimer's dementia 2013  . Pneumonia 2014  . Dyspnea   . Type II diabetes mellitus   . Depression        Medication List         albuterol (2.5 MG/3ML) 0.083% nebulizer solution  Commonly known as:  PROVENTIL  Take  2.5 mg by nebulization every 6 (six) hours as needed for wheezing or shortness of breath.     budesonide-formoterol 160-4.5 MCG/ACT inhaler  Commonly known as:  SYMBICORT  Inhale 2 puffs into the lungs 2 (two) times daily.  clopidogrel 75 MG tablet  Commonly known as:  PLAVIX  Take 75 mg by mouth daily.     gabapentin 100 MG capsule  Commonly known as:  NEURONTIN  Take 1 capsule (100 mg total) by mouth 2 (two) times daily.     metFORMIN 500 MG tablet  Commonly known as:  GLUCOPHAGE  Take 500 mg by mouth at bedtime.     olmesartan 40 MG tablet  Commonly known as:  BENICAR  Take 40 mg by mouth daily.     oxyCODONE 5 MG immediate release tablet  Commonly known as:  ROXICODONE  Take 1 tablet (5 mg total) by mouth every 6 (six) hours as needed for severe pain.     oxyCODONE-acetaminophen 5-325 MG per tablet  Commonly known as:  PERCOCET  Take 1 tablet by mouth every 6 (six) hours as needed for moderate pain.     rosuvastatin 20 MG tablet  Commonly known as:  CRESTOR  Take 20 mg by mouth at bedtime.     Vitamin D 2000 UNITS Caps  Take 1 capsule by mouth at bedtime.        Roxicodone #30 No Refill  Disposition: home  Patient's condition: is Good  Follow up: 1. Dr. Kellie Simmering in 2 weeks   Leontine Locket, PA-C [facility] [phone] 05/01/2013  3:32 PM  - For VQI Registry use --- Instructions: Press F2 to tab through selections.  Delete question if not applicable.   Post-op:  Wound infection: No  Graft infection: No  Transfusion: Yes  If yes, 1 units given New Arrhythmia: No Ipsilateral amputation: No, [ ]  Minor, [ ]  BKA, [ ]  AKA Discharge patency: [x ] Primary, [ ]  Primary assisted, [ ]  Secondary, [ ]  Occluded Patency judged by: [ ]  Dopper only, [ ]  Palpable graft pulse, [ ]  Palpable distal pulse, [ ]  ABI inc. > 0.15, [ ]  Duplex Discharge ABI: R 0.65, L 0.46 Discharge TBI: R , L  D/C Ambulatory Status: Ambulatory  Complications: MI: No,  [ ]  Troponin only, [ ]  EKG or Clinical CHF: No Resp failure:No, [ ]  Pneumonia, [ ]  Ventilator Chg in renal function: No, [ ]  Inc. Cr > 0.5, [ ]  Temp. Dialysis, [ ]  Permanent dialysis Stroke: No, [ ]  Minor, [ ]  Major Return to OR: No  Reason for return to OR: [ ]  Bleeding, [ ]  Infection, [ ]  Thrombosis, [ ]  Revision  Discharge medications: Statin use:  yes ASA use:  no Plavix use:  yes Beta blocker use: no Coumadin use: no

## 2013-05-01 NOTE — Telephone Encounter (Signed)
Message copied by Gena Fray on Fri May 01, 2013  4:21 PM ------      Message from: Peter Minium K      Created: Thu Apr 30, 2013  1:33 PM      Regarding: schedule                   ----- Message -----         From: Gabriel Earing, PA-C         Sent: 04/30/2013  12:54 PM           To: [facility] Charge Pool            Right femoral-popliteal bypass graft (above-knee) using 6 mm propaten Gore-Tex graft with INTRA OPERATIVE ARTERIOGRAM on 04/29/13.              F/u with Dr. Kellie Simmering in 2-3 weeks.            Thanks,      Aldona Bar ------

## 2013-05-01 NOTE — Progress Notes (Signed)
[facility] Progress Note  05/01/2013 9:38 AM 2 Days Post-Op  Subjective:  No complaints  Afebrile VSS  Filed Vitals:   05/01/13 0349  BP: 166/78  Pulse: 87  Temp: 98.6 F (37 C)  Resp: 18    Physical Exam: Incisions:  Right groin wound is c/d/i as well as above knee incision.   CBC    Component Value Date/Time   WBC 10.7* 04/30/2013 0107   RBC 4.11* 04/30/2013 0107   HGB 9.1* 04/30/2013 0107   HCT 30.4* 04/30/2013 0107   PLT 338 04/30/2013 0107   MCV 74.0* 04/30/2013 0107   MCH 22.1* 04/30/2013 0107   MCHC 29.9* 04/30/2013 0107   RDW 18.7* 04/30/2013 0107   LYMPHSABS 1.8 04/17/2013 1813   MONOABS 0.7 04/17/2013 1813   EOSABS 0.7 04/17/2013 1813   BASOSABS 0.1 04/17/2013 1813    BMET    Component Value Date/Time   NA 141 04/30/2013 0107   K 4.5 04/30/2013 0107   CL 106 04/30/2013 0107   CO2 23 04/30/2013 0107   GLUCOSE 149* 04/30/2013 0107   BUN 10 04/30/2013 0107   CREATININE 0.98 04/30/2013 0107   CALCIUM 8.5 04/30/2013 0107   GFRNONAA 78* 04/30/2013 0107   GFRAA >90 04/30/2013 0107    INR    Component Value Date/Time   INR 1.00 04/28/2013 1127     Intake/Output Summary (Last 24 hours) at 05/01/13 2595 Last data filed at 04/30/13 1300  Gross per 24 hour  Intake    120 ml  Output      0 ml  Net    120 ml   ABI's 04/30/13:  RIGHT    LEFT     PRESSURE  WAVEFORM   PRESSURE  WAVEFORM   BRACHIAL  135  T  BRACHIAL  141  T   DP  85  DM  DP  65  DM   AT    AT     PT  92  DM  PT     PER    PER  60  DM   GREAT TOE   NA  GREAT TOE   NA     RIGHT  LEFT   ABI  0.65  0.46       Assessment:  79 y.o. male is s/p:  Right femoral-popliteal bypass graft (above-knee) using 6 mm propaten Gore-Tex graft with INTRA OPERATIVE ARTERIOGRAM  2 Days Post-Op  Plan: -ABI's improved on right from 0.43 to 0.65 post operatively -DVT prophylaxis:  Lovenox -pt doing well ambulating in halls -home soon   Leontine Locket, [place] [facility] [phone] 05/01/2013 9:38 AM

## 2013-05-01 NOTE — Progress Notes (Signed)
Occupational Therapy Treatment Patient Details Name: [name] MRN: [id number] DOB: 04-07-1934 Today's Date: 05/01/2013 Time: 2778-2423 OT Time Calculation (min): 26 min  OT Assessment / Plan / Recommendation  History of present illness Gangrenous toes, ischemia right leg with right superficial femoral occlusion and tibial occlusive disease.  S/p R fem-pop BPG.   OT comments  Pt making progress with functional goals and should continue with acute OT services to increase level of function and safety to return home  Follow Up Recommendations  No OT follow up    Barriers to Discharge   none    Equipment Recommendations  None recommended by OT    Recommendations for Other Services    Frequency Min 2X/week   Progress towards OT Goals Progress towards OT goals: Progressing toward goals  Plan Discharge plan remains appropriate    Precautions / Restrictions Precautions Precautions: Fall   Pertinent Vitals/Pain No c/o pain    ADL  Grooming: Wash/dry hands;Wash/dry face;Supervision/safety;Min guard Where Assessed - Grooming: Supported standing Toilet Transfer: Performed;Min Psychiatric nurse Method: Sit to stand;Stand Ecologist: Therapist, occupational and Hygiene: Performed;Min guard Where Assessed - Best boy and Hygiene: Standing Tub/Shower Transfer: Simulated;Other (comment);Minimal assistance (with 3 in 1, mod cues) Equipment Used: Other (comment);Gait belt (3 in 1) Transfers/Ambulation Related to ADLs: cues for safety, sequencing. Mod verbal and tactile cues for direction/seqeuncing for safety during simulated tub transfer ADL Comments: Pt required increased time due to confusion, required verbal and tactile cues for safety, direction/sequencing    OT Diagnosis:    OT Problem List:   OT Treatment Interventions:     OT Goals(current goals can now be found in the care plan section) Acute Rehab OT  Goals Patient Stated Goal: get home  Visit Information  Last OT Received On: 05/01/13 Assistance Needed: +1 History of Present Illness: Gangrenous toes, ischemia right leg with right superficial femoral occlusion and tibial occlusive disease.  S/p R fem-pop BPG.    Subjective Data      Prior Functioning       Cognition  Cognition Arousal/Alertness: Awake/alert Behavior During Therapy: WFL for tasks assessed/performed Overall Cognitive Status: History of cognitive impairments - at baseline Memory: Decreased short-term memory;Decreased recall of precautions. Pt not oriented to time, place, date or situation. He believes it's July 1973 and that name of hospital is [facility]. Pt pleasantly confused    Mobility  Bed Mobility Overal bed mobility: Needs Assistance Bed Mobility: Supine to Sit;Sit to Supine Supine to sit: Supervision Sit to supine: Supervision General bed mobility comments: cues for sequencing Transfers Overall transfer level: Needs assistance Transfers: Sit to/from Stand;Stand Pivot Transfers Sit to Stand: Min guard General transfer comment: cues for safety, sequencing          Balance Balance Overall balance assessment: Needs assistance Sitting-balance support: No upper extremity supported;Feet supported Sitting balance-Leahy Scale: Good Standing balance support: Single extremity supported;During functional activity Standing balance-Leahy Scale: Fair  End of Session OT - End of Session Equipment Utilized During Treatment: Gait belt;Other (comment) (BSC) Activity Tolerance: Patient tolerated treatment well Patient left: in bed;with call bell/phone within reach;with bed alarm set  GO     Britt Bottom 05/01/2013, 2:16 PM

## 2013-05-02 LAB — GLUCOSE, CAPILLARY: GLUCOSE-CAPILLARY: 117 mg/dL — AB (ref 70–99)

## 2013-05-02 NOTE — Progress Notes (Signed)
   Daily Progress Note  Assessment/Planning: POD #3 s/p R fem-pop BPG w/ propaten   Home today  Subjective  - 3 Days Post-Op  No events, no complaints  Objective Filed Vitals:   05/01/13 1406 05/01/13 2005 05/01/13 2149 05/02/13 0436  BP: 135/78 144/81  155/78  Pulse: 85 89  87  Temp: 98.4 F (36.9 C) 98.5 F (36.9 C)  98.6 F (37 C)  TempSrc: Oral Oral  Oral  Resp: 18 18  18   Height:      Weight:      SpO2: 100% 100% 100% 99%    Intake/Output Summary (Last 24 hours) at 05/02/13 0859 Last data filed at 05/01/13 1833  Gross per 24 hour  Intake    360 ml  Output      0 ml  Net    360 ml    PULM  CTAB CV  RRR GI  soft, NTND VASC  Inc x 2 c/d/i, warm right foot  Laboratory CBC    Component Value Date/Time   WBC 10.7* 04/30/2013 0107   HGB 9.1* 04/30/2013 0107   HCT 30.4* 04/30/2013 0107   PLT 338 04/30/2013 0107    BMET    Component Value Date/Time   NA 141 04/30/2013 0107   K 4.5 04/30/2013 0107   CL 106 04/30/2013 0107   CO2 23 04/30/2013 0107   GLUCOSE 149* 04/30/2013 0107   BUN 10 04/30/2013 0107   CREATININE 0.98 04/30/2013 0107   CALCIUM 8.5 04/30/2013 0107   GFRNONAA 78* 04/30/2013 0107   GFRAA >90 04/30/2013 0107    Adele Barthel, MD [facility]: [phone] Pager: [phone]  05/02/2013, 8:59 AM

## 2013-05-03 LAB — TYPE AND SCREEN
ABO/RH(D): O POS
ANTIBODY SCREEN: NEGATIVE
Unit division: 0
Unit division: 0

## 2013-05-04 ENCOUNTER — Encounter (HOSPITAL_COMMUNITY): Payer: Self-pay | Admitting: Vascular Surgery

## 2013-05-12 ENCOUNTER — Telehealth: Payer: Self-pay | Admitting: Vascular Surgery

## 2013-05-12 ENCOUNTER — Encounter: Payer: Medicare HMO | Admitting: Vascular Surgery

## 2013-05-12 ENCOUNTER — Other Ambulatory Visit (HOSPITAL_COMMUNITY): Payer: Medicare HMO

## 2013-05-12 ENCOUNTER — Encounter (HOSPITAL_COMMUNITY): Payer: Medicare HMO

## 2013-05-12 NOTE — Telephone Encounter (Signed)
Patient's wife called regarding appointment schedule for 05/12/13. She stated that it had not been two weeks from her husbands surgery on 04/29/13. When I told the patient that tomorrow would be two weeks exactly from the procedure but Dr. Kellie Simmering is only in the office on Tuesday's, she was not happy and stated that no one called her regarding the appointment although she called our office today and specifically stated that her husbands appointment on 05/12/13 was not two weeks. Once I pointed this out, she stated that she would not come out in the snow. I rescheduled the appointment for the patient to 05/26/13 as it was the next available opening and the patient's wife was fine with that date.

## 2013-05-25 ENCOUNTER — Encounter: Payer: Self-pay | Admitting: Vascular Surgery

## 2013-05-26 ENCOUNTER — Encounter: Payer: Self-pay | Admitting: Vascular Surgery

## 2013-05-26 ENCOUNTER — Ambulatory Visit (INDEPENDENT_AMBULATORY_CARE_PROVIDER_SITE_OTHER)
Admission: RE | Admit: 2013-05-26 | Discharge: 2013-05-26 | Disposition: A | Discharge status: Home / Self Care | Payer: Medicare HMO | Source: Ambulatory Visit | Attending: Vascular Surgery | Admitting: Vascular Surgery

## 2013-05-26 ENCOUNTER — Ambulatory Visit (HOSPITAL_COMMUNITY)
Admission: RE | Admit: 2013-05-26 | Discharge: 2013-05-26 | Disposition: A | Discharge status: Home / Self Care | Payer: Medicare HMO | Source: Ambulatory Visit | Attending: Vascular Surgery | Admitting: Vascular Surgery

## 2013-05-26 ENCOUNTER — Ambulatory Visit (INDEPENDENT_AMBULATORY_CARE_PROVIDER_SITE_OTHER): Payer: Medicare HMO | Admitting: Vascular Surgery

## 2013-05-26 VITALS — BP 141/70 | HR 79 | Temp 97.7°F | Ht 72.0 in | Wt 169.2 lb

## 2013-05-26 DIAGNOSIS — Z48812 Encounter for surgical aftercare following surgery on the circulatory system: Secondary | ICD-10-CM

## 2013-05-26 DIAGNOSIS — Z9889 Other specified postprocedural states: Secondary | ICD-10-CM | POA: Insufficient documentation

## 2013-05-26 DIAGNOSIS — I739 Peripheral vascular disease, unspecified: Secondary | ICD-10-CM

## 2013-05-26 DIAGNOSIS — I70209 Unspecified atherosclerosis of native arteries of extremities, unspecified extremity: Secondary | ICD-10-CM | POA: Insufficient documentation

## 2013-05-26 NOTE — Progress Notes (Signed)
Subjective:     Patient ID: [name], male   DOB: [birth year], 79 y.o.   MRN: [id number]  HPI this 79 year old male returns 1 month post right femoral-popliteal bypass using 6 mm Gore-Tex to the above-knee popliteal artery. He has one-vessel runoff through diseased posterior tibial artery. He had gangrenous changes of the right fifth and third toes. He is having some swelling but states that his leg is feeling much better.   Review of Systems     Objective:   Physical Exam BP 141/70  Pulse 79  Temp(Src) 97.7 F (36.5 C) (Oral)  Ht 6' (1.829 m)  Wt 169 lb 3.2 oz (76.749 kg)  BMI 22.94 kg/m2  SpO2 98%  General well-developed well-nourished male no apparent stress alert and oriented x3 Right leg with well-healed incisions in the inguinal and above-knee area. Right foot is well perfused. There is 1+ edema from the cath distally. Dry gangrene involving the right third and the tip of the right fifth toe with no evidence of infection or saline this     Assessment:     Nicely functioning right femoral popliteal Gore-Tex bypass with dry gangrene right third and fifth toes    Plan:     Return in 2 months with duplex scan of right femoral popliteal graft in repeat ABIs and will then further make decision regarding status of toes If these become infected or moist in the interim the patient's wife will be in touch with [place]

## 2013-06-04 ENCOUNTER — Other Ambulatory Visit: Payer: Self-pay | Admitting: *Deleted

## 2013-06-04 DIAGNOSIS — I739 Peripheral vascular disease, unspecified: Secondary | ICD-10-CM

## 2013-06-04 DIAGNOSIS — Z48812 Encounter for surgical aftercare following surgery on the circulatory system: Secondary | ICD-10-CM

## 2013-06-11 ENCOUNTER — Institutional Professional Consult (permissible substitution): Payer: Medicare HMO | Admitting: Internal Medicine

## 2013-06-16 ENCOUNTER — Ambulatory Visit (INDEPENDENT_AMBULATORY_CARE_PROVIDER_SITE_OTHER): Payer: Medicare HMO | Admitting: Internal Medicine

## 2013-06-16 ENCOUNTER — Encounter: Payer: Self-pay | Admitting: Internal Medicine

## 2013-06-16 VITALS — BP 118/60 | HR 70 | Temp 97.5°F | Ht 71.0 in | Wt 170.0 lb

## 2013-06-16 DIAGNOSIS — J449 Chronic obstructive pulmonary disease, unspecified: Secondary | ICD-10-CM

## 2013-06-16 NOTE — Patient Instructions (Addendum)
Continue symbicort Take 2 puffs first thing in am and then another 2 puffs about 12 hours later.   Only use your albuterol as a rescue medication to be used if you can't catch your breath by resting or doing a relaxed purse lip breathing pattern.  - The less you use it, the better it will work when you need it. - Ok to use up every 4 hours if you must but call for immediate appointment if use goes up over your usual need

## 2013-06-16 NOTE — Progress Notes (Signed)
Subjective:     Patient ID: [name], male   DOB: April 02, 1934  MRN: [id number]     Brief patient profile:  [age]  [age]obm s/p smoking cessation in 1995 previously felt to have COPD with the last spirometry in the chart dated 11/18/2000 showing classic upper airway obstruction with no convincing evidence of significant COPD or other forms of intrathoracic airflow obstruction based on the effort independent portion of the FV loop    HPI January 06, 2009 Followup with PFT's. Pt states that overall breathing has been okay. He gets SOB when he gets in a rush. [name] states that pt gets out of breath just walking from one room to the next. Also he c/o dry cough- especially at night x 4-6 weeks eval by [facility] with cxr and rx cough syrup > no better. no purulent sputum. Try higher strength of Symbicort 160 2 puffs first thing in am and 2 puffs again in pm about 12 hours later and if happy with it fill the prescription.  as long as you are coughing take the zegerid otc at bedtime > seemed to help to pt and wife's satisfaction.    05/04/2011 ov/ [name] cc sob much on symbicort to point where don't need the neb at all, nothing limited from doing by sob and no cough rec f/u prn   . 06/16/2013 f/u ov/[name] re: GOLD II  COPD on symbicort 160 2bid  Chief Complaint  Patient presents with  . Pulmonary Consult    Pt states here to f/u since he has new insurance. He reports breahting is doing well and no new co's today.     Walking around the house and ok at [place] s walker/ 02 Never needs rescue but has neb "in case"  Very rarely uses it.   No obvious day to day or daytime variabilty or assoc chronic cough or cp or chest tightness, subjective wheeze overt sinus or hb symptoms. No unusual exp hx or h/o childhood pna/ asthma or knowledge of premature birth.  Sleeping ok without nocturnal  or early am exacerbation  of respiratory  c/o's or need for noct saba. Also denies any obvious fluctuation of symptoms with  weather or environmental changes or other aggravating or alleviating factors except as outlined above   Current Medications, Allergies, Complete Past Medical History, Past Surgical History, Family History, and Social History were reviewed in [employer] record.  ROS  The following are not active complaints unless bolded sore throat, dysphagia, dental problems, itching, sneezing,  nasal congestion or excess/ purulent secretions, ear ache,   fever, chills, sweats, unintended wt loss, pleuritic or exertional cp, hemoptysis,  orthopnea pnd or leg swelling, presyncope, palpitations, heartburn, abdominal pain, anorexia, nausea, vomiting, diarrhea  or change in bowel or urinary habits, change in stools or urine, dysuria,hematuria,  rash, arthralgias, visual complaints, headache, numbness weakness or ataxia or problems with walking or coordination,  change in mood/affect or memory.        Past Medical History:  HYPERLIPIDEMIA (ICD-272.4)  PVD (ICD-443.9)  HYPERTENSION NEC  DYSPNEA (ICD-786.05)  COPD  PFT's 11/18/00 FEV1 is 1.94 or 60%  - PFT's 01/06/09 FEV1 1.75 (57%) ratio 49 and 21 % better FVC after B2 DLC0 71  - HFA 90% better p coaching 01/23/10             Objective:   Physical Exam   elderly black male ambulatory mildly hoarse NAD struggling with questions re specific symptom issues  Wt 180 > 187 January 06, 2009 > 186 01/23/10 > 05/04/2011  191 > 06/16/2013  170   HEENT mild turbinate edema. Edentulous,  Oropharynx no thrush or excess pnd or cobblestoning. No JVD or cervical adenopathy. Mild accessory muscle hypertrophy. Trachea midline, nl thryroid. Chest was hyperinflated by percussion with diminished breath sounds and moderate increased exp time with trace bilateral mid expiratory wheeze. Hoover sign positive at mid inspiration. Regular rate and rhythm without murmur gallop or rub or increase P2. No edema Abd: no hsm, nl excursion. Ext warm without cyanosis or  clubbing.  04/28/13 pcxr No active disease.     Assessment:

## 2013-06-17 NOTE — Assessment & Plan Note (Signed)
PFT's 11/18/00 FEV1 is 1.94 or 60%  - PFT's 01/06/09 FEV1 1.75 (57%) ratio 49 and 21 % better FVC after B2 DLC0 71   The proper method of use, as well as anticipated side effects, of a metered-dose inhaler are discussed and demonstrated to the patient. Improved effectiveness after extensive coaching during this visit to a level of approximately  75% so ok to continue symbicort but with decline in cognitive fxn might be easier to use breo with the alternative being ICS/LABA neb.    Each maintenance medication was reviewed in detail including most importantly the difference between maintenance and as needed and under what circumstances the prns are to be used.  Please see instructions for details which were reviewed in writing and the patient given a copy.    Pulmonary f/u is prn

## 2013-06-20 ENCOUNTER — Ambulatory Visit (INDEPENDENT_AMBULATORY_CARE_PROVIDER_SITE_OTHER): Payer: Commercial Managed Care - HMO | Admitting: Emergency Medicine

## 2013-06-20 VITALS — BP 134/58 | HR 96 | Temp 97.7°F | Resp 18 | Ht 68.0 in | Wt 168.0 lb

## 2013-06-20 DIAGNOSIS — H00039 Abscess of eyelid unspecified eye, unspecified eyelid: Secondary | ICD-10-CM

## 2013-06-20 MED ORDER — OFLOXACIN 0.3 % OP SOLN: 1.0000 [drp] | OPHTHALMIC | Status: DC

## 2013-06-20 NOTE — Progress Notes (Signed)
This chart was scribed for [name] A. Everlene Farrier, MD by Stacy Gardner, [facility] Scribe. The patient was seen in room and the patient's care was started at 4:52 PM.   Conjunctivitis  Associated symptoms include eye redness. Pertinent negatives include no double vision, no photophobia and no eye pain.   HPI Comments: [name] is a 79 y.o. male with dementia whose wife arrives to the [facility] complaining of left eye redness, onset last night. Denies injury or trauma.  Pt denies pain currently. Pt reports that having pain during the holidays. [name] states the eye did not look as severe last night. She is unaware of anything that he may have applied around his eye and reports that she is always with him. Pt complains that his eye is itching and he scratches his eye constantly. Denies VI.Pt had cataract surgery last year. Denies any known allergies to medications.   Review of Systems  Eyes: Positive for redness. Negative for blurred vision, double vision, photophobia and pain.    Physical Exam Left eye exam. There is some. Limited. Around the upper and the lower lid. There are no vesicles seen. He is status post cataract surgery. There is no conjunctival irritation. No foreign body was seen beneath the left upper lid. Fluorescein staining was accomplished and no abrasion was seen.  Filed Vitals:   06/20/13 1625  BP: 134/58  Pulse: 96  Temp: 97.7 F (36.5 C)  Resp: 18    Assessment   COORDINATION OF CARE:  4:52 PM Discussed course of care with pt . Pt understands and agrees.    Plan Patient to keep the periodic material cleaned off of his upper and lower lid. He will use Ocuflox drops 1-2 drops in the left 4 times a day culture was sent. He is to follow up with Dr. Carolynn Sayers on Monday

## 2013-06-20 NOTE — Patient Instructions (Signed)
Please call Monday morning and followup with Dr. Katy Fitch

## 2013-06-22 ENCOUNTER — Other Ambulatory Visit: Payer: Self-pay

## 2013-06-22 MED ORDER — CEPHALEXIN 500 MG PO CAPS: 500.0000 mg | ORAL_CAPSULE | Freq: Two times a day (BID) | ORAL | Status: DC

## 2013-06-23 LAB — WOUND CULTURE: GRAM STAIN: NONE SEEN

## 2013-06-23 MED ORDER — DOXYCYCLINE HYCLATE 100 MG PO TABS: 100.0000 mg | ORAL_TABLET | Freq: Two times a day (BID) | ORAL | Status: DC

## 2013-06-23 NOTE — Addendum Note (Signed)
Addended by: Jethro Bolus A on: 06/23/2013 08:27 AM   Modules accepted: Orders

## 2013-06-24 ENCOUNTER — Encounter: Payer: Self-pay | Admitting: Family

## 2013-06-24 ENCOUNTER — Ambulatory Visit (INDEPENDENT_AMBULATORY_CARE_PROVIDER_SITE_OTHER): Payer: Self-pay | Admitting: Family

## 2013-06-24 VITALS — BP 133/72 | HR 84 | Temp 97.3°F | Resp 16 | Ht 72.0 in | Wt 171.9 lb

## 2013-06-24 DIAGNOSIS — I739 Peripheral vascular disease, unspecified: Secondary | ICD-10-CM

## 2013-06-24 DIAGNOSIS — I7025 Atherosclerosis of native arteries of other extremities with ulceration: Secondary | ICD-10-CM | POA: Insufficient documentation

## 2013-06-24 DIAGNOSIS — L089 Local infection of the skin and subcutaneous tissue, unspecified: Secondary | ICD-10-CM

## 2013-06-24 DIAGNOSIS — L98499 Non-pressure chronic ulcer of skin of other sites with unspecified severity: Secondary | ICD-10-CM

## 2013-06-24 MED ORDER — CEPHALEXIN 500 MG PO CAPS: 500.0000 mg | ORAL_CAPSULE | Freq: Two times a day (BID) | ORAL | Status: DC

## 2013-06-24 NOTE — Progress Notes (Signed)
[facility] HISTORY AND PHYSICAL -PAD  History of Present Illness [name] is a 79 y.o. male patient of Dr. Kellie Simmering who is s/p right Fem-Pop BPG 04-29-13. He returns today for C/O Right 3rd toe non-healing wound, drainage,odor and pain, duration 6-8 weeks. He was otherwise to return in May for duplex scan of right femoral popliteal graft in repeat ABIs and will then further make decision regarding status of toes . If these become infected or moist in the interim the patient's wife was advised to be in touch with [place]. He had a nicely functioning right femoral popliteal Gore-Tex bypass with dry gangrene right third and fifth toes.  Wife states he does not complain of much pain, gives him OTC Aleve prn. Wife denies that he has had fever or chills. Wife has been applying daily dry gauze dressings to right third toe as advised, but states pt has been removing dressing and bed sheets rub against open wound, noted that patient has dementia. Wife states pt rarely elevates legs.  Pt Diabetic: Yes, A1C three months ago was 6.4, in control Pt smoker: former smoker, quit in 1995  Pt meds include: Statin :Yes ASA: No Other anticoagulants/antiplatelets: Plavix  Past Medical History  Diagnosis Date  . Hyperlipidemia   . PVD (peripheral vascular disease)   . Hypertension   . COPD (chronic obstructive pulmonary disease)   . Colon cancer   . Alzheimer's dementia 2013  . Pneumonia 2014  . Dyspnea   . Type II diabetes mellitus   . Depression     Social History History  Substance Use Topics  . Smoking status: Former Smoker -- 0.30 packs/day for 40 years    Types: Cigarettes    Quit date: 03/26/1993  . Smokeless tobacco: Never Used  . Alcohol Use: No    Family History Family History  Problem Relation Age of Onset  . Stroke Mother 28  . Hypertension Mother   . Cancer Sister   . Heart disease Brother     Heart Disease before age 28  . Heart attack Brother    . Cancer Sister   . Cancer Brother     Past Surgical History  Procedure Laterality Date  . Colon surgery  1996    cancer  . Lumbar disc surgery  2005  . Vascular surgery    . Colon resection  1997    /enc. notes 05/17/2004 (04/28/2013)  . Femoral artery - femoral artery bypass graft      right to left/enc. notes 05/17/2004 (04/28/2013)  . Femoral bypass Right     /enc. notes 05/17/2004 (04/28/2013)  . Fiberoptic bronchoscopy      /enc. notes 03/16/2005 (04/28/2013)  . Cataract extraction w/ intraocular lens implant Left 01/2013  . Cataract extraction Right 02/2013  . Femoral-popliteal bypass graft Right 04/29/2013    Procedure:  FEMORAL-POPLITEAL ARTERY Bypass Graft with intraoperative ultrasound and arteriogarm;  Surgeon: Mal Misty, MD;  Location: [facility];  Service: Vascular;  Laterality: Right;  . Intraoperative arteriogram Right 04/29/2013    Procedure: INTRA OPERATIVE ARTERIOGRAM;  Surgeon: Mal Misty, MD;  Location: [facility];  Service: Vascular;  Laterality: Right;    No Known Allergies  Current Outpatient Prescriptions  Medication Sig Dispense Refill  . albuterol (PROVENTIL) (2.5 MG/3ML) 0.083% nebulizer solution Take 2.5 mg by nebulization every 6 (six) hours as needed for wheezing or shortness of breath.      . budesonide-formoterol (SYMBICORT) 160-4.5 MCG/ACT inhaler Inhale 2  puffs into the lungs 2 (two) times daily.      . cephALEXin (KEFLEX) 500 MG capsule Take 1 capsule (500 mg total) by mouth 2 (two) times daily.  14 capsule  0  . Cholecalciferol (VITAMIN D) 2000 UNITS CAPS Take 1 capsule by mouth at bedtime.      . clopidogrel (PLAVIX) 75 MG tablet Take 75 mg by mouth daily.       Marland Kitchen doxycycline (VIBRA-TABS) 100 MG tablet Take 1 tablet (100 mg total) by mouth 2 (two) times daily.  14 tablet  0  . metFORMIN (GLUCOPHAGE) 500 MG tablet Take 500 mg by mouth at bedtime.      Marland Kitchen ofloxacin (OCUFLOX) 0.3 % ophthalmic solution Place 1 drop into the left eye every 4 (four) hours.  5 mL   0  . olmesartan (BENICAR) 40 MG tablet Take 40 mg by mouth daily.      . rosuvastatin (CRESTOR) 20 MG tablet Take 20 mg by mouth at bedtime.        No current facility-administered medications for this visit.    ROS: See HPI for pertinent positives and negatives.   Physical Examination  Filed Vitals:   06/24/13 0925  BP: 133/72  Pulse: 84  Temp: 97.3 F (36.3 C)  Resp: 16   Filed Weights   06/24/13 0925  Weight: 171 lb 14.4 oz (77.973 kg)   Body mass index is 23.31 kg/(m^2).  General: A&O x 3, WDWN. Gait: normal Pulmonary: Respirations non labored, occasional moist cough Cardiac: regular Rythm   Radial pulses: are  2+ and palpable                        VASCULAR EXAM: Extremities with ischemic changes in right third toe, dusky at base of toe, moist open shallow wound on dorsal aspect of right third toe, scant serous/thin/yellow/bloody drainage on dressing, foul odor.                                                                                                           LE Pulses LEFT RIGHT       FEMORAL  faintly palpable  faintly palpable        POPLITEAL  not palpable   not palpable       POSTERIOR TIBIAL  monophasic by Doppler and not palpable   monophasic by Doppler and not palpable        DORSALIS PEDIS      ANTERIOR TIBIAL monophasic by Doppler and not palpable  monophasic by Doppler and not palpable     Musculoskeletal: no muscle wasting or atrophy.  Neurologic: A&O X 2; Appropriate Affect ; SENSATION: normal; MOTOR FUNCTION:  moving all extremities equally, Speech is fluent/normal.    ASSESSMENT: [name] is a 79 y.o. male who is s/p right Fem-Pop BPG 04-29-13. He returns today for C/O Right 3rd toe non-healing wound, drainage,odor and pain, duration 6-8 weeks. He was otherwise to return in May for duplex scan of right femoral popliteal graft in  repeat ABIs and will then further make decision regarding status of toes . If these become infected  or moist in the interim the patient's wife was advised to be in touch with [place]. Wife has been applying daily dry gauze dressings to right third toe as advised, but states pt has been removing dressing and bed sheets rub against open wound, noted that patient has dementia. Wife states pt rarely elevates legs. Right 5th toe no longer has any evidence of gangrene, no evidence of ischemia. Right third toe is being exposed to bed linen abrasions as patient sleeps since he takes of the dressings that his wife applies, dorsal aspect of toe with shallow ulcer, foul smelling, evidence of gangrene.  Dr. Scot Dock examined patient and spoke with patient and wife.  PLAN:  Keflex 500 mg bid x 2 weeks, no refills. Start daily dial soap soaks to right foot, dry thoroughly, apply gauze dressing, then sock to keep in place. Elevate legs above heart when not walking, as best as is feasibly possible.  Based on the patient's vascular studies and examination, pt will return to clinic in 2 weeks to see Dr. Kellie Simmering.  The patient was given information about PAD including signs, symptoms, treatment, what symptoms should prompt the patient to seek immediate medical care, and risk reduction measures to take.  Clemon Chambers, RN, MSN, FNP-C [facility] Phone: [phone]  Clinic MD: Scot Dock  06/24/2013 9:17 AM

## 2013-06-24 NOTE — Patient Instructions (Signed)
Peripheral Vascular Disease Peripheral Vascular Disease (PVD), also called Peripheral Arterial Disease (PAD), is a circulation problem caused by cholesterol (atherosclerotic plaque) deposits in the arteries. PVD commonly occurs in the lower extremities (legs) but it can occur in other areas of the body, such as your arms. The cholesterol buildup in the arteries reduces blood flow which can cause pain and other serious problems. The presence of PVD can place a person at risk for Coronary Artery Disease (CAD).  CAUSES  Causes of PVD can be many. It is usually associated with more than one risk factor such as:   High Cholesterol.  Smoking.  Diabetes.  Lack of exercise or inactivity.  High blood pressure (hypertension).  Obesity.  Family history. SYMPTOMS   When the lower extremities are affected, patients with PVD may experience:  Leg pain with exertion or physical activity. This is called INTERMITTENT CLAUDICATION. This may present as cramping or numbness with physical activity. The location of the pain is associated with the level of blockage. For example, blockage at the abdominal level (distal abdominal aorta) may result in buttock or hip pain. Lower leg arterial blockage may result in calf pain.  As PVD becomes more severe, pain can develop with less physical activity.  In people with severe PVD, leg pain may occur at rest.  Other PVD signs and symptoms:  Leg numbness or weakness.  Coldness in the affected leg or foot, especially when compared to the other leg.  A change in leg color.  Patients with significant PVD are more prone to ulcers or sores on toes, feet or legs. These may take longer to heal or may reoccur. The ulcers or sores can become infected.  If signs and symptoms of PVD are ignored, gangrene may occur. This can result in the loss of toes or loss of an entire limb.  Not all leg pain is related to PVD. Other medical conditions can cause leg pain such  as:  Blood clots (embolism) or Deep Vein Thrombosis.  Inflammation of the blood vessels (vasculitis).  Spinal stenosis. DIAGNOSIS  Diagnosis of PVD can involve several different types of tests. These can include:  Pulse Volume Recording Method (PVR). This test is simple, painless and does not involve the use of X-rays. PVR involves measuring and comparing the blood pressure in the arms and legs. An ABI (Ankle-Brachial Index) is calculated. The normal ratio of blood pressures is 1. As this number becomes smaller, it indicates more severe disease.  < 0.95  indicates significant narrowing in one or more leg vessels.  <0.8 there will usually be pain in the foot, leg or buttock with exercise.  <0.4 will usually have pain in the legs at rest.  <0.25  usually indicates limb threatening PVD.  Doppler detection of pulses in the legs. This test is painless and checks to see if you have a pulses in your legs/feet.  A dye or contrast material (a substance that highlights the blood vessels so they show up on x-ray) may be given to help your caregiver better see the arteries for the following tests. The dye is eliminated from your body by the kidney's. Your caregiver may order blood work to check your kidney function and other laboratory values before the following tests are performed:  Magnetic Resonance Angiography (MRA). An MRA is a picture study of the blood vessels and arteries. The MRA machine uses a large magnet to produce images of the blood vessels.  Computed Tomography Angiography (CTA). A CTA is a   specialized x-ray that looks at how the blood flows in your blood vessels. An IV may be inserted into your arm so contrast dye can be injected.  Angiogram. Is a procedure that uses x-rays to look at your blood vessels. This procedure is minimally invasive, meaning a small incision (cut) is made in your groin. A small tube (catheter) is then inserted into the artery of your groin. The catheter is  guided to the blood vessel or artery your caregiver wants to examine. Contrast dye is injected into the catheter. X-rays are then taken of the blood vessel or artery. After the images are obtained, the catheter is taken out. TREATMENT  Treatment of PVD involves many interventions which may include:  Lifestyle changes:  Quitting smoking.  Exercise.  Following a low fat, low cholesterol diet.  Control of diabetes.  Foot care is very important to the PVD patient. Good foot care can help prevent infection.  Medication:  Cholesterol-lowering medicine.  Blood pressure medicine.  Anti-platelet drugs.  Certain medicines may reduce symptoms of Intermittent Claudication.  Interventional/Surgical options:  Angioplasty. An Angioplasty is a procedure that inflates a balloon in the blocked artery. This opens the blocked artery to improve blood flow.  Stent Implant. A wire mesh tube (stent) is placed in the artery. The stent expands and stays in place, allowing the artery to remain open.  Peripheral Bypass Surgery. This is a surgical procedure that reroutes the blood around a blocked artery to help improve blood flow. This type of procedure may be performed if Angioplasty or stent implants are not an option. SEEK IMMEDIATE MEDICAL CARE IF:   You develop pain or numbness in your arms or legs.  Your arm or leg turns cold, becomes blue in color.  You develop redness, warmth, swelling and pain in your arms or legs. MAKE SURE YOU:   Understand these instructions.  Will watch your condition.  Will get help right away if you are not doing well or get worse. Document Released: 04/19/2004 Document Revised: 06/04/2011 Document Reviewed: 03/16/2008 Exit[employer] Patient Information 2014 [employer].  

## 2013-07-02 ENCOUNTER — Other Ambulatory Visit: Payer: Self-pay | Admitting: Internal Medicine

## 2013-07-06 ENCOUNTER — Encounter: Payer: Self-pay | Admitting: Vascular Surgery

## 2013-07-07 ENCOUNTER — Ambulatory Visit (INDEPENDENT_AMBULATORY_CARE_PROVIDER_SITE_OTHER): Payer: Commercial Managed Care - HMO | Admitting: Vascular Surgery

## 2013-07-07 ENCOUNTER — Encounter: Payer: Self-pay | Admitting: Vascular Surgery

## 2013-07-07 VITALS — BP 132/63 | HR 95 | Ht 72.0 in | Wt 174.3 lb

## 2013-07-07 DIAGNOSIS — L98499 Non-pressure chronic ulcer of skin of other sites with unspecified severity: Principal | ICD-10-CM

## 2013-07-07 DIAGNOSIS — I739 Peripheral vascular disease, unspecified: Secondary | ICD-10-CM

## 2013-07-07 NOTE — Progress Notes (Signed)
Subjective:     Patient ID: CAPERS HAGMANN, male   DOB: 1935/03/01, 79 y.o.   MRN: [id number]  HPI this 79 year old male returns for continued followup regarding his right femoral-popliteal Gore-Tex graft performed 04/29/2013. He does have dry gangrene of the right third toe which we're following. He has no specific complaints.  Review of Systems     Objective:   Physical Exam BP 132/63  Pulse 95  Ht 6' (1.829 m)  Wt 174 lb 4.8 oz (79.062 kg)  BMI 23.63 kg/m2  SpO2 97%  General well-developed well-nourished male no apparent stress alert oriented x3 Inguinal and distal thigh wounds are healing nicely. Dry gangrene anterior aspect of the right third toe. No evidence of infection. Right foot adequately perfused.      Assessment:     Dry gangrene of the anterior aspect of the right third toe. This may slough and not require formal amputation. There is no evidence of infection. Bypass is functioning nicely.    Plan:     Patient will return in 6 weeks for continued followup unless there is evidence of infection. Wife we'll watch this closely.

## 2013-07-28 ENCOUNTER — Other Ambulatory Visit (HOSPITAL_COMMUNITY): Payer: Medicare HMO

## 2013-07-28 ENCOUNTER — Encounter (HOSPITAL_COMMUNITY): Payer: Medicare HMO

## 2013-07-28 ENCOUNTER — Ambulatory Visit: Payer: Medicare HMO | Admitting: Vascular Surgery

## 2013-08-11 ENCOUNTER — Ambulatory Visit: Payer: Medicare HMO | Admitting: Vascular Surgery

## 2013-08-24 ENCOUNTER — Encounter: Payer: Self-pay | Admitting: Vascular Surgery

## 2013-08-25 ENCOUNTER — Inpatient Hospital Stay (HOSPITAL_COMMUNITY): Admission: RE | Admit: 2013-08-25 | Payer: Medicare HMO | Source: Ambulatory Visit

## 2013-08-25 ENCOUNTER — Ambulatory Visit: Payer: Medicare HMO | Admitting: Vascular Surgery

## 2013-09-01 ENCOUNTER — Emergency Department (HOSPITAL_COMMUNITY): Payer: Medicare HMO

## 2013-09-01 ENCOUNTER — Other Ambulatory Visit: Payer: Self-pay

## 2013-09-01 ENCOUNTER — Inpatient Hospital Stay (HOSPITAL_COMMUNITY)
Admission: EM | Admit: 2013-09-01 | Discharge: 2013-09-07 | DRG: 811 | Disposition: A | Discharge status: Home Health | Payer: Medicare HMO | Attending: Family Medicine | Admitting: Family Medicine

## 2013-09-01 ENCOUNTER — Encounter: Payer: Self-pay | Admitting: Adult Health

## 2013-09-01 ENCOUNTER — Ambulatory Visit (INDEPENDENT_AMBULATORY_CARE_PROVIDER_SITE_OTHER): Payer: Commercial Managed Care - HMO | Admitting: Adult Health

## 2013-09-01 ENCOUNTER — Encounter (HOSPITAL_COMMUNITY): Payer: Self-pay | Admitting: Emergency Medicine

## 2013-09-01 VITALS — BP 104/84 | HR 98 | Temp 98.1°F | Ht 71.0 in | Wt 169.8 lb

## 2013-09-01 DIAGNOSIS — F17200 Nicotine dependence, unspecified, uncomplicated: Secondary | ICD-10-CM | POA: Diagnosis present

## 2013-09-01 DIAGNOSIS — J449 Chronic obstructive pulmonary disease, unspecified: Secondary | ICD-10-CM | POA: Diagnosis present

## 2013-09-01 DIAGNOSIS — Z961 Presence of intraocular lens: Secondary | ICD-10-CM

## 2013-09-01 DIAGNOSIS — Z8249 Family history of ischemic heart disease and other diseases of the circulatory system: Secondary | ICD-10-CM

## 2013-09-01 DIAGNOSIS — J9611 Chronic respiratory failure with hypoxia: Secondary | ICD-10-CM | POA: Insufficient documentation

## 2013-09-01 DIAGNOSIS — I10 Essential (primary) hypertension: Secondary | ICD-10-CM | POA: Diagnosis present

## 2013-09-01 DIAGNOSIS — I5022 Chronic systolic (congestive) heart failure: Secondary | ICD-10-CM

## 2013-09-01 DIAGNOSIS — D509 Iron deficiency anemia, unspecified: Principal | ICD-10-CM

## 2013-09-01 DIAGNOSIS — I5023 Acute on chronic systolic (congestive) heart failure: Secondary | ICD-10-CM | POA: Diagnosis present

## 2013-09-01 DIAGNOSIS — Z85038 Personal history of other malignant neoplasm of large intestine: Secondary | ICD-10-CM

## 2013-09-01 DIAGNOSIS — F3289 Other specified depressive episodes: Secondary | ICD-10-CM | POA: Diagnosis present

## 2013-09-01 DIAGNOSIS — R0609 Other forms of dyspnea: Secondary | ICD-10-CM

## 2013-09-01 DIAGNOSIS — R799 Abnormal finding of blood chemistry, unspecified: Secondary | ICD-10-CM | POA: Diagnosis present

## 2013-09-01 DIAGNOSIS — J9601 Acute respiratory failure with hypoxia: Secondary | ICD-10-CM

## 2013-09-01 DIAGNOSIS — J189 Pneumonia, unspecified organism: Secondary | ICD-10-CM

## 2013-09-01 DIAGNOSIS — K259 Gastric ulcer, unspecified as acute or chronic, without hemorrhage or perforation: Secondary | ICD-10-CM | POA: Diagnosis present

## 2013-09-01 DIAGNOSIS — R0989 Other specified symptoms and signs involving the circulatory and respiratory systems: Secondary | ICD-10-CM

## 2013-09-01 DIAGNOSIS — F329 Major depressive disorder, single episode, unspecified: Secondary | ICD-10-CM | POA: Diagnosis present

## 2013-09-01 DIAGNOSIS — Z9849 Cataract extraction status, unspecified eye: Secondary | ICD-10-CM

## 2013-09-01 DIAGNOSIS — I739 Peripheral vascular disease, unspecified: Secondary | ICD-10-CM | POA: Diagnosis present

## 2013-09-01 DIAGNOSIS — R05 Cough: Secondary | ICD-10-CM

## 2013-09-01 DIAGNOSIS — Z823 Family history of stroke: Secondary | ICD-10-CM

## 2013-09-01 DIAGNOSIS — I509 Heart failure, unspecified: Secondary | ICD-10-CM | POA: Diagnosis present

## 2013-09-01 DIAGNOSIS — J9600 Acute respiratory failure, unspecified whether with hypoxia or hypercapnia: Secondary | ICD-10-CM | POA: Diagnosis present

## 2013-09-01 DIAGNOSIS — D649 Anemia, unspecified: Secondary | ICD-10-CM | POA: Diagnosis present

## 2013-09-01 DIAGNOSIS — R059 Cough, unspecified: Secondary | ICD-10-CM

## 2013-09-01 DIAGNOSIS — K2960 Other gastritis without bleeding: Secondary | ICD-10-CM | POA: Diagnosis present

## 2013-09-01 DIAGNOSIS — K2980 Duodenitis without bleeding: Secondary | ICD-10-CM | POA: Diagnosis present

## 2013-09-01 DIAGNOSIS — R0902 Hypoxemia: Secondary | ICD-10-CM | POA: Diagnosis present

## 2013-09-01 DIAGNOSIS — R0600 Dyspnea, unspecified: Secondary | ICD-10-CM

## 2013-09-01 DIAGNOSIS — F0280 Dementia in other diseases classified elsewhere without behavioral disturbance: Secondary | ICD-10-CM | POA: Diagnosis present

## 2013-09-01 DIAGNOSIS — Z79899 Other long term (current) drug therapy: Secondary | ICD-10-CM

## 2013-09-01 DIAGNOSIS — IMO0002 Reserved for concepts with insufficient information to code with codable children: Secondary | ICD-10-CM

## 2013-09-01 DIAGNOSIS — F0390 Unspecified dementia without behavioral disturbance: Secondary | ICD-10-CM

## 2013-09-01 DIAGNOSIS — E785 Hyperlipidemia, unspecified: Secondary | ICD-10-CM | POA: Diagnosis present

## 2013-09-01 DIAGNOSIS — R079 Chest pain, unspecified: Secondary | ICD-10-CM

## 2013-09-01 DIAGNOSIS — I2589 Other forms of chronic ischemic heart disease: Secondary | ICD-10-CM | POA: Diagnosis present

## 2013-09-01 DIAGNOSIS — J4489 Other specified chronic obstructive pulmonary disease: Secondary | ICD-10-CM

## 2013-09-01 DIAGNOSIS — Z7982 Long term (current) use of aspirin: Secondary | ICD-10-CM

## 2013-09-01 DIAGNOSIS — J441 Chronic obstructive pulmonary disease with (acute) exacerbation: Secondary | ICD-10-CM

## 2013-09-01 DIAGNOSIS — E119 Type 2 diabetes mellitus without complications: Secondary | ICD-10-CM | POA: Diagnosis present

## 2013-09-01 DIAGNOSIS — B3781 Candidal esophagitis: Secondary | ICD-10-CM

## 2013-09-01 DIAGNOSIS — G309 Alzheimer's disease, unspecified: Secondary | ICD-10-CM | POA: Diagnosis present

## 2013-09-01 LAB — COMPREHENSIVE METABOLIC PANEL
ALT: 6 U/L (ref 0–53)
AST: 19 U/L (ref 0–37)
Albumin: 3.4 g/dL — ABNORMAL LOW (ref 3.5–5.2)
Alkaline Phosphatase: 80 U/L (ref 39–117)
BUN: 18 mg/dL (ref 6–23)
CALCIUM: 9.2 mg/dL (ref 8.4–10.5)
CO2: 21 mEq/L (ref 19–32)
CREATININE: 0.95 mg/dL (ref 0.50–1.35)
Chloride: 107 mEq/L (ref 96–112)
GFR calc Af Amer: >90 mL/min (ref >90)
GFR, EST NON AFRICAN AMERICAN: 79 mL/min — AB (ref >90)
Glucose, Bld: 121 mg/dL — ABNORMAL HIGH (ref 70–99)
Potassium: 4.1 mEq/L (ref 3.7–5.3)
Sodium: 142 mEq/L (ref 137–147)
TOTAL PROTEIN: 7.1 g/dL (ref 6.0–8.3)
Total Bilirubin: 0.5 mg/dL (ref 0.3–1.2)

## 2013-09-01 LAB — CBC
HCT: 26.9 % — ABNORMAL LOW (ref 39.0–52.0)
Hemoglobin: 7.7 g/dL — ABNORMAL LOW (ref 13.0–17.0)
MCH: 19.8 pg — AB (ref 26.0–34.0)
MCHC: 28.6 g/dL — ABNORMAL LOW (ref 30.0–36.0)
MCV: 69.2 fL — ABNORMAL LOW (ref 78.0–100.0)
PLATELETS: 434 10*3/uL — AB (ref 150–400)
RBC: 3.89 MIL/uL — AB (ref 4.22–5.81)
RDW: 17 % — ABNORMAL HIGH (ref 11.5–15.5)
WBC: 8.5 10*3/uL (ref 4.0–10.5)

## 2013-09-01 LAB — POC OCCULT BLOOD, ED: FECAL OCCULT BLD: NEGATIVE

## 2013-09-01 LAB — I-STAT TROPONIN, ED: Troponin i, poc: 0.15 ng/mL (ref 0.00–0.08)

## 2013-09-01 LAB — PREPARE RBC (CROSSMATCH)

## 2013-09-01 LAB — TROPONIN I

## 2013-09-01 MED ORDER — ONDANSETRON HCL 4 MG/2ML IJ SOLN
4.0000 mg | Freq: Four times a day (QID) | INTRAMUSCULAR | Status: DC | PRN
Start: 2013-09-01 — End: 2013-09-07

## 2013-09-01 MED ORDER — ATORVASTATIN CALCIUM 40 MG PO TABS
40.0000 mg | ORAL_TABLET | Freq: Every day | ORAL | Status: DC
  Administered 2013-09-02 – 2013-09-06 (×5): 40 mg via ORAL
  Filled 2013-09-01 (×6): qty 1

## 2013-09-01 MED ORDER — LEVALBUTEROL HCL 0.63 MG/3ML IN NEBU
0.6300 mg | INHALATION_SOLUTION | Freq: Once | RESPIRATORY_TRACT | Status: AC
  Administered 2013-09-01: 0.63 mg via RESPIRATORY_TRACT

## 2013-09-01 MED ORDER — ALBUTEROL SULFATE (2.5 MG/3ML) 0.083% IN NEBU
2.5000 mg | INHALATION_SOLUTION | Freq: Four times a day (QID) | RESPIRATORY_TRACT | Status: DC | PRN
  Administered 2013-09-02: 2.5 mg via RESPIRATORY_TRACT
  Filled 2013-09-01: qty 3

## 2013-09-01 MED ORDER — IRBESARTAN 300 MG PO TABS
300.0000 mg | ORAL_TABLET | Freq: Every day | ORAL | Status: DC
  Administered 2013-09-01 – 2013-09-07 (×7): 300 mg via ORAL
  Filled 2013-09-01 (×7): qty 1

## 2013-09-01 MED ORDER — ACETAMINOPHEN 325 MG PO TABS: 650.0000 mg | ORAL_TABLET | ORAL | Status: DC | PRN

## 2013-09-01 MED ORDER — BUDESONIDE-FORMOTEROL FUMARATE 160-4.5 MCG/ACT IN AERO
2.0000 | INHALATION_SPRAY | Freq: Two times a day (BID) | RESPIRATORY_TRACT | Status: DC
  Administered 2013-09-01 – 2013-09-07 (×12): 2 via RESPIRATORY_TRACT
  Filled 2013-09-01 (×2): qty 6

## 2013-09-01 MED ORDER — CLOPIDOGREL BISULFATE 75 MG PO TABS
75.0000 mg | ORAL_TABLET | Freq: Every day | ORAL | Status: DC
  Administered 2013-09-02 – 2013-09-04 (×3): 75 mg via ORAL
  Filled 2013-09-01 (×4): qty 1

## 2013-09-01 MED ORDER — HEPARIN SODIUM (PORCINE) 5000 UNIT/ML IJ SOLN
5000.0000 [IU] | Freq: Three times a day (TID) | INTRAMUSCULAR | Status: DC
  Administered 2013-09-02 (×2): 5000 [IU] via SUBCUTANEOUS
  Filled 2013-09-01 (×7): qty 1

## 2013-09-01 MED ORDER — IOHEXOL 350 MG/ML SOLN
100.0000 mL | Freq: Once | INTRAVENOUS | Status: AC | PRN
  Administered 2013-09-01: 100 mL via INTRAVENOUS

## 2013-09-01 NOTE — ED Notes (Addendum)
Per EMS- pt was at pulmonology MD today for SOB with exertion for 3 weeks. Pt was noted to have sats in the 70% and HR increased to 150 when he ambulated. Pt received a breathing treatment at MD office. Pt has hx of dementia and is at baseline.

## 2013-09-01 NOTE — ED Notes (Signed)
Report attempted 

## 2013-09-01 NOTE — Progress Notes (Signed)
Subjective:     Patient ID: [name], male   DOB: 08-Apr-1934  MRN: [id number]     Brief patient profile:  55  yobm s/p smoking cessation in 1995 previously felt to have COPD with the last spirometry in the chart dated 11/18/2000 showing classic upper airway obstruction with no convincing evidence of significant COPD or other forms of intrathoracic airflow obstruction based on the effort independent portion of the FV loop    HPI January 06, 2009 Followup with PFT's. Pt states that overall breathing has been okay. He gets SOB when he gets in a rush. [name] states that pt gets out of breath just walking from one room to the next. Also he c/o dry cough- especially at night x 4-6 weeks eval by [facility] with cxr and rx cough syrup > no better. no purulent sputum. Try higher strength of Symbicort 160 2 puffs first thing in am and 2 puffs again in pm about 12 hours later and if happy with it fill the prescription.  as long as you are coughing take the zegerid otc at bedtime > seemed to help to pt and wife's satisfaction.    05/04/2011 ov/ Wert cc sob much on symbicort to point where don't need the neb at all, nothing limited from doing by sob and no cough rec f/u prn   . 06/16/2013 f/u ov/Wert re: GOLD II  COPD on symbicort 160 2bid  Chief Complaint  Patient presents with  . Pulmonary Consult    Pt states here to f/u since he has new insurance. He reports breahting is doing well and no new co's today.     Walking around the house and ok at [employer] s walker/ 02 Never needs rescue but has neb "in case"  Very rarely uses it. >no changes   09/01/2013 Acute OV  Complains of increased SOB especially w/ exertion, wheezing, some dry cough x2-3weeks. No fever or discolored mucus  Occurred while at [facility]. No leg swelling, calf pain or edema.  At rest no significant dyspnea. But cant walk 10 feet without severe dyspnea, cough and wheezing.  Today in office with desats in 70% with  ambulation.  EKG shows ST depression .  O2 sats at rest 100% on RA  No chest pain, n/v/d, hemoptysis , fever.  Denies f/c/s, hemoptysis, nausea, vomiting. No calf pain    Current Medications, Allergies, Complete Past Medical History, Past Surgical History, Family History, and Social History were reviewed in [employer] record.  ROS  The following are not active complaints unless bolded sore throat, dysphagia, dental problems, itching, sneezing,  nasal congestion or excess/ purulent secretions, ear ache,   fever, chills, sweats, unintended wt loss, pleuritic or exertional cp, hemoptysis,  orthopnea pnd or leg swelling, presyncope, palpitations, heartburn, abdominal pain, anorexia, nausea, vomiting, diarrhea  or change in bowel or urinary habits, change in stools or urine, dysuria,hematuria,  rash, arthralgias, visual complaints, headache, numbness weakness or ataxia or problems with walking or coordination,  change in mood/affect or memory.        Past Medical History:  HYPERLIPIDEMIA (ICD-272.4)  PVD (ICD-443.9)  HYPERTENSION NEC  DYSPNEA (ICD-786.05)  COPD  PFT's 11/18/00 FEV1 is 1.94 or 60%  - PFT's 01/06/09 FEV1 1.75 (57%) ratio 49 and 21 % better FVC after B2 DLC0 71  - HFA 90% better p coaching 01/23/10             Objective:   Physical Exam  elderly black male ambulatory mildly hoarse NAD struggling with questions re specific symptom issues Wt 180 > 187 January 06, 2009 > 186 01/23/10 > 05/04/2011  191 > 06/16/2013  170 >169 09/01/2013   HEENT mild turbinate edema. Edentulous,  Oropharynx no thrush or excess pnd or cobblestoning. No JVD or cervical adenopathy. Mild accessory muscle hypertrophy. Trachea midline, nl thryroid. Chest was hyperinflated by percussion with diminished breath sounds and moderate increased exp time with trace bilateral mid expiratory wheeze. Hoover sign positive at mid inspiration. Regular rate and rhythm without murmur gallop or rub  or increase P2. No edema , neg homans sign Abd: no hsm, nl excursion. Ext warm without cyanosis or clubbing.  04/28/13 pcxr No active disease.  EKG NSR HR 100, frequent PVC , ST depression      Assessment:

## 2013-09-01 NOTE — Progress Notes (Signed)
Peak flow performed with patient.  Explained and demonstrated back.  Performed 3 times, with score of 150.  Patient stated he had no further questions.

## 2013-09-01 NOTE — ED Notes (Signed)
Elevated troponin reported To Dr. Mingo Amber

## 2013-09-01 NOTE — ED Provider Notes (Signed)
CSN: [id number]     Arrival date & time 09/01/13  1804 History   First MD Initiated Contact with Patient 09/01/13 1812     Chief Complaint  Patient presents with  . Shortness of Breath     (Consider location/radiation/quality/duration/timing/severity/associated sxs/prior Treatment) HPI  79 yo M w/ h/o COPD, HTN, colon cancer, DM II here with DOE worsening over last 3 weeks after a car trip to the beach. No chest pain, fever or productive cough. No leg swelling, pain or orhter related symptoms. Was at pulmonology office for it today and found to have desaturations and tachycardia upon ambulation, but normal at rest. Sent here for further eval.   Past Medical History  Diagnosis Date  . Hyperlipidemia   . PVD (peripheral vascular disease)   . Hypertension   . COPD (chronic obstructive pulmonary disease)   . Colon cancer   . Alzheimer's dementia 2013  . Pneumonia 2014  . Dyspnea   . Type II diabetes mellitus   . Depression    Past Surgical History  Procedure Laterality Date  . Colon surgery  1996    cancer  . Lumbar disc surgery  2005  . Vascular surgery    . Colon resection  1997    /enc. notes 05/17/2004 (04/28/2013)  . Femoral artery - femoral artery bypass graft      right to left/enc. notes 05/17/2004 (04/28/2013)  . Femoral bypass Right     /enc. notes 05/17/2004 (04/28/2013)  . Fiberoptic bronchoscopy      /enc. notes 03/16/2005 (04/28/2013)  . Cataract extraction w/ intraocular lens implant Left 01/2013  . Cataract extraction Right 02/2013  . Femoral-popliteal bypass graft Right 04/29/2013    Procedure:  FEMORAL-POPLITEAL ARTERY Bypass Graft with intraoperative ultrasound and arteriogarm;  Surgeon: Mal Misty, MD;  Location: [place];  Service: Vascular;  Laterality: Right;  . Intraoperative arteriogram Right 04/29/2013    Procedure: INTRA OPERATIVE ARTERIOGRAM;  Surgeon: Mal Misty, MD;  Location: [place];  Service: Vascular;  Laterality: Right;  . Spine surgery     Family  History  Problem Relation Age of Onset  . Stroke Mother 38  . Hypertension Mother   . Cancer Sister   . Heart disease Sister   . Heart disease Brother     Heart Disease before age 34  . Heart attack Brother   . Cancer Sister   . Cancer Brother    History  Substance Use Topics  . Smoking status: Former Smoker -- 0.30 packs/day for 40 years    Types: Cigarettes    Quit date: 03/26/1993  . Smokeless tobacco: Never Used  . Alcohol Use: No    Review of Systems  Constitutional: Positive for diaphoresis (with ambulation). Negative for fever and chills.  HENT: Negative for congestion and rhinorrhea.   Eyes: Negative for pain.  Respiratory: Positive for cough and shortness of breath (with ambulation).   Cardiovascular: Negative for chest pain and palpitations.  Gastrointestinal: Negative for vomiting, abdominal pain, diarrhea and constipation.  Endocrine: Negative for polydipsia and polyuria.  Genitourinary: Negative for dysuria and flank pain.  Musculoskeletal: Negative for back pain and neck pain.  Skin: Negative for color change and wound.  Neurological: Negative for dizziness, numbness and headaches.      Allergies  Review of patient's allergies indicates no known allergies.  Home Medications   Prior to Admission medications   Medication Sig Start Date End Date Taking? Authorizing Provider  albuterol (PROVENTIL) (2.5 MG/3ML) 0.083% nebulizer  solution Take 2.5 mg by nebulization every 6 (six) hours as needed for wheezing or shortness of breath.    Historical Provider, MD  albuterol (PROVENTIL) (2.5 MG/3ML) 0.083% nebulizer solution USE ONE VIAL IN NEBULIZER EVERY 4 HOURS AS NEEDED 07/02/13   Tanda Rockers, MD  budesonide-formoterol [facility]) 160-4.5 MCG/ACT inhaler Inhale 2 puffs into the lungs 2 (two) times daily.    Historical Provider, MD  Cholecalciferol (VITAMIN D) 2000 UNITS CAPS Take 1 capsule by mouth at bedtime.    Historical Provider, MD  clopidogrel (PLAVIX) 75  MG tablet Take 75 mg by mouth daily.  04/04/12   Historical Provider, MD  metFORMIN (GLUCOPHAGE) 500 MG tablet Take 500 mg by mouth at bedtime.    Historical Provider, MD  olmesartan (BENICAR) 40 MG tablet Take 40 mg by mouth daily.    Historical Provider, MD  rosuvastatin (CRESTOR) 20 MG tablet Take 20 mg by mouth at bedtime.     Historical Provider, MD   BP 160/67  Temp(Src) 98 F (36.7 C) (Oral)  Resp 21  SpO2 100% Physical Exam  Nursing note and vitals reviewed. Constitutional: He is oriented to person, place, and time. He appears well-developed and well-nourished.  HENT:  Head: Normocephalic and atraumatic.  Eyes: Conjunctivae and EOM are normal. Pupils are equal, round, and reactive to light.  Neck: Normal range of motion.  Cardiovascular: Normal rate and regular rhythm.   Pulmonary/Chest: Effort normal and breath sounds normal.  Abdominal: Soft. He exhibits no distension. There is no tenderness.  Musculoskeletal: Normal range of motion. He exhibits no edema and no tenderness.  Neurological: He is alert and oriented to person, place, and time.  Skin: Skin is warm and dry.    ED Course  Procedures (including critical care time) Labs Review Labs Reviewed  CBC - Abnormal; Notable for the following:    RBC 3.89 (*)    Hemoglobin 7.7 (*)    HCT 26.9 (*)    MCV 69.2 (*)    MCH 19.8 (*)    MCHC 28.6 (*)    RDW 17.0 (*)    Platelets 434 (*)    All other components within normal limits  COMPREHENSIVE METABOLIC PANEL - Abnormal; Notable for the following:    Glucose, Bld 121 (*)    Albumin 3.4 (*)    GFR calc non Af Amer 79 (*)    All other components within normal limits  FERRITIN - Abnormal; Notable for the following:    Ferritin 8 (*)    All other components within normal limits  IRON AND TIBC - Abnormal; Notable for the following:    Iron <10 (*)    All other components within normal limits  CBC WITH DIFFERENTIAL - Abnormal; Notable for the following:    RBC 4.10 (*)     Hemoglobin 8.5 (*)    HCT 28.8 (*)    MCV 70.2 (*)    MCH 20.7 (*)    MCHC 29.5 (*)    RDW 17.9 (*)    Eosinophils Relative 7 (*)    All other components within normal limits  PRO B NATRIURETIC PEPTIDE - Abnormal; Notable for the following:    Pro B Natriuretic peptide (BNP) 3066.0 (*)    All other components within normal limits  GLUCOSE, CAPILLARY - Abnormal; Notable for the following:    Glucose-Capillary 156 (*)    All other components within normal limits  I-STAT TROPOININ, ED - Abnormal; Notable for the following:  Troponin i, poc 0.15 (*)    All other components within normal limits  TROPONIN I  TROPONIN I  TROPONIN I  POC OCCULT BLOOD, ED  TYPE AND SCREEN  PREPARE RBC (CROSSMATCH)    Imaging Review Dg Chest 2 View  09/01/2013   CLINICAL DATA:  Shortness of breath, congestion, COPD  EXAM: CHEST  2 VIEW  COMPARISON:  04/28/2013  FINDINGS: Stable hyperinflation noted compatible with COPD/ emphysema. Mild apical scarring noted. Ill-defined streaky right basilar density partially obscures the hemidiaphragm suspicious for right lower lobe airspace process or pneumonia. This is posterior on the lateral view. Left lung appears clear. Normal heart size and vascularity. Atherosclerosis of the aorta. Trachea is midline. No pneumothorax.  IMPRESSION: Hyperinflation compatible with COPD/ emphysema.  Streaky ill-defined right base opacities overlying the hemidiaphragm suspicious for subtle right basilar pneumonia.   Electronically Signed   By: Daryll Brod M.D.   On: 09/01/2013 20:10   Ct Angio Chest Pe W/cm &/or Wo Cm  09/01/2013   CLINICAL DATA:  Shortness of breath. Rule out pulmonary embolism. Dementia.  EXAM: CT ANGIOGRAPHY CHEST WITH CONTRAST  TECHNIQUE: Multidetector CT imaging of the chest was performed using the standard protocol during bolus administration of intravenous contrast. Multiplanar CT image reconstructions and MIPs were obtained to evaluate the vascular anatomy.   CONTRAST:  112mL OMNIPAQUE IOHEXOL 350 MG/ML SOLN  COMPARISON:  02/21/2006  FINDINGS: THORACIC INLET/BODY WALL:  No acute abnormality.  MEDIASTINUM:  Normal heart size. No pericardial effusion. Multi focal atherosclerosis, including the coronary arteries. No acute vascular abnormality, including pulmonary embolism or aortic dissection. Prominent mediastinal lymph nodes, likely reactive to the following.  LUNG WINDOWS:  Mild interlobular septal thickening and small layering bilateral pleural effusions. Previously noted opacity at the right base has a predominantly linear morphology, favoring atelectasis. Diffuse bronchial wall thickening which could be smoking-related or secondary to vascular congestion. There is a small area of ill-defined nodularity in the posterior segment right upper lobe.  UPPER ABDOMEN:  Possible cholelithiasis, although incomplete imaging limits certainty.  OSSEOUS:  No acute fracture.  No suspicious lytic or blastic lesions.  Review of the MIP images confirms the above findings.  IMPRESSION: 1. Negative for pulmonary embolism. 2. Mild pulmonary edema and small bilateral pleural effusion. 3. Minimal airspace disease in the right upper lobe which could be infectious or inflammatory.   Electronically Signed   By: Jorje Guild M.D.   On: 09/01/2013 21:22   Dg Chest Port 1 View  09/02/2013   CLINICAL DATA:  Dyspnea.  EXAM: PORTABLE CHEST - 1 VIEW  COMPARISON:  CT 09/01/2013 and chest radiograph 04/28/2013  FINDINGS: Single view of the chest was obtained. There are enlarged interstitial densities in the lower lungs bilaterally, right side greater the left. Heart size is normal. Atherosclerotic calcifications at the aortic arch. The trachea is midline. Blunting at the right costophrenic angle suggests a small effusion.  IMPRESSION: Enlarged interstitial lung markings, particularly in the right lower lung. Differential diagnosis would include asymmetric pulmonary edema versus atypical  infection.  Small right pleural effusion.   Electronically Signed   By: Markus Daft M.D.   On: 09/02/2013 13:27     EKG Interpretation None      MDM   Final diagnoses:  Acute respiratory failure with hypoxia  DOE (dyspnea on exertion)  Cough  Dementia  Chest pain    79 yo M w/ h/o COPD adn colon cancer here for exertional dyspnea worsening over last few weeks,  tachycardic and hypoxic with ambulation at pulmonologists office. Stable here. No evidence of dvt in ble. Not hypoxic or tachycardic at rest. Anemia found on cbc and attempted admission. Medicine wanted cta to rule out pe performed first. After discussion the cta was ordered and negative. Hemoccult negative. Patient admitted to medicine for transfusion and further workup.     Merrily Pew, MD 09/02/13 2042

## 2013-09-01 NOTE — ED Notes (Signed)
Ordered patient meal tray

## 2013-09-01 NOTE — ED Notes (Signed)
Patient transported to CT 

## 2013-09-01 NOTE — Assessment & Plan Note (Signed)
Possible flare vs progressive COPD changes  xopenex neb x 1 without significant improvement .

## 2013-09-01 NOTE — ED Notes (Signed)
Dr. Gardener at bedside. 

## 2013-09-01 NOTE — H&P (Signed)
[facility] History and Physical  [name] QQV:[id number] DOB: 1934-09-01 DOA: 09/01/2013  Referring physician: EDP PCP: [name], MD   Chief Complaint: DOE   HPI: [name] is a 79 y.o. male h/o PAD, CAD, who presents to the ED from his pulmonlogists office with 3 week history of progressively worsening DOE.  Patient is generally asymptomatic at rest (though per wife he did wake up in the middle of the night a couple of times over the past weeks with c/o CP and SOB); however, with ambulation today at his pulmonologist's office he was noted to desat down to 70%.  He was sent to the ED for the new onset hypoxia on exertion and pulmonologist noted concern for possible PE given his recent travel 3 weeks ago and unimpressive pulmonary findings on exam (no wheezing, etc).  Review of Systems: Systems reviewed.  As above, otherwise negative  Past Medical History  Diagnosis Date  . Hyperlipidemia   . PVD (peripheral vascular disease)   . Hypertension   . COPD (chronic obstructive pulmonary disease)   . Colon cancer   . Alzheimer's dementia 2013  . Pneumonia 2014  . Dyspnea   . Type II diabetes mellitus   . Depression    Past Surgical History  Procedure Laterality Date  . Colon surgery  1996    cancer  . Lumbar disc surgery  2005  . Vascular surgery    . Colon resection  1997    /enc. notes 05/17/2004 (04/28/2013)  . Femoral artery - femoral artery bypass graft      right to left/enc. notes 05/17/2004 (04/28/2013)  . Femoral bypass Right     /enc. notes 05/17/2004 (04/28/2013)  . Fiberoptic bronchoscopy      /enc. notes 03/16/2005 (04/28/2013)  . Cataract extraction w/ intraocular lens implant Left 01/2013  . Cataract extraction Right 02/2013  . Femoral-popliteal bypass graft Right 04/29/2013    Procedure:  FEMORAL-POPLITEAL ARTERY Bypass Graft with intraoperative ultrasound and arteriogarm;  Surgeon: Mal Misty, MD;  Location: [place];  Service: Vascular;   Laterality: Right;  . Intraoperative arteriogram Right 04/29/2013    Procedure: INTRA OPERATIVE ARTERIOGRAM;  Surgeon: Mal Misty, MD;  Location: [facility] OR;  Service: Vascular;  Laterality: Right;  . Spine surgery     Social History:  reports that he quit smoking about 20 years ago. His smoking use included Cigarettes. He has a 12 pack-year smoking history. He has never used smokeless tobacco. He reports that he does not drink alcohol or use illicit drugs.  No Known Allergies  Family History  Problem Relation Age of Onset  . Stroke Mother 33  . Hypertension Mother   . Cancer Sister   . Heart disease Sister   . Heart disease Brother     Heart Disease before age 81  . Heart attack Brother   . Cancer Sister   . Cancer Brother      Prior to Admission medications   Medication Sig Start Date End Date Taking? Authorizing Provider  albuterol (PROVENTIL) (2.5 MG/3ML) 0.083% nebulizer solution Take 2.5 mg by nebulization every 6 (six) hours as needed for wheezing or shortness of breath.   Yes Historical Provider, MD  budesonide-formoterol (SYMBICORT) 160-4.5 MCG/ACT inhaler Inhale 2 puffs into the lungs 2 (two) times daily.   Yes Historical Provider, MD  Cholecalciferol (VITAMIN D) 2000 UNITS CAPS Take 1 capsule by mouth at bedtime.   Yes Historical Provider, MD  clopidogrel (PLAVIX) 75 MG tablet  Take 75 mg by mouth daily.  04/04/12  Yes Historical Provider, MD  metFORMIN (GLUCOPHAGE) 500 MG tablet Take 500 mg by mouth at bedtime.   Yes Historical Provider, MD  olmesartan (BENICAR) 40 MG tablet Take 40 mg by mouth daily.   Yes Historical Provider, MD  rosuvastatin (CRESTOR) 20 MG tablet Take 20 mg by mouth at bedtime.    Yes Historical Provider, MD   Physical Exam: Filed Vitals:   09/01/13 2124  BP: 105/86  Pulse: 109  Temp:   Resp: 20    BP 105/86  Pulse 109  Temp(Src) 98 F (36.7 C) (Oral)  Resp 20  SpO2 98%  General Appearance:    Alert, oriented, no distress, appears stated age   Head:    Normocephalic, atraumatic  Eyes:    PERRL, EOMI, sclera non-icteric        Nose:   Nares without drainage or epistaxis. Mucosa, turbinates normal  Throat:   Moist mucous membranes. Oropharynx without erythema or exudate.  Neck:   Supple. No carotid bruits.  No thyromegaly.  No lymphadenopathy.   Back:     No CVA tenderness, no spinal tenderness  Lungs:     Clear to auscultation bilaterally, without wheezes, rhonchi or rales  Chest wall:    No tenderness to palpitation  Heart:    Regular rate and rhythm without murmurs, gallops, rubs  Abdomen:     Soft, non-tender, nondistended, normal bowel sounds, no organomegaly  Genitalia:    deferred  Rectal:    deferred  Extremities:   No clubbing, cyanosis or edema.  Pulses:   2+ and symmetric all extremities  Skin:   Skin color, texture, turgor normal, no rashes or lesions  Lymph nodes:   Cervical, supraclavicular, and axillary nodes normal  Neurologic:   CNII-XII intact. Normal strength, sensation and reflexes      throughout    Labs on Admission:  Basic Metabolic Panel:  Recent Labs Lab 09/01/13 1834  NA 142  K 4.1  CL 107  CO2 21  GLUCOSE 121*  BUN 18  CREATININE 0.95  CALCIUM 9.2   Liver Function Tests:  Recent Labs Lab 09/01/13 1834  AST 19  ALT 6  ALKPHOS 80  BILITOT 0.5  PROT 7.1  ALBUMIN 3.4*   No results found for this basename: LIPASE, AMYLASE,  in the last 168 hours No results found for this basename: AMMONIA,  in the last 168 hours CBC:  Recent Labs Lab 09/01/13 1834  WBC 8.5  HGB 7.7*  HCT 26.9*  MCV 69.2*  PLT 434*   Cardiac Enzymes: No results found for this basename: CKTOTAL, CKMB, CKMBINDEX, TROPONINI,  in the last 168 hours  BNP (last 3 results) No results found for this basename: PROBNP,  in the last 8760 hours CBG: No results found for this basename: GLUCAP,  in the last 168 hours  Radiological Exams on Admission: Dg Chest 2 View  09/01/2013   CLINICAL DATA:  Shortness of  breath, congestion, COPD  EXAM: CHEST  2 VIEW  COMPARISON:  04/28/2013  FINDINGS: Stable hyperinflation noted compatible with COPD/ emphysema. Mild apical scarring noted. Ill-defined streaky right basilar density partially obscures the hemidiaphragm suspicious for right lower lobe airspace process or pneumonia. This is posterior on the lateral view. Left lung appears clear. Normal heart size and vascularity. Atherosclerosis of the aorta. Trachea is midline. No pneumothorax.  IMPRESSION: Hyperinflation compatible with COPD/ emphysema.  Streaky ill-defined right base opacities overlying the hemidiaphragm suspicious  for subtle right basilar pneumonia.   Electronically Signed   By: Daryll Brod M.D.   On: 09/01/2013 20:10   Ct Angio Chest Pe W/cm &/or Wo Cm  09/01/2013   CLINICAL DATA:  Shortness of breath. Rule out pulmonary embolism. Dementia.  EXAM: CT ANGIOGRAPHY CHEST WITH CONTRAST  TECHNIQUE: Multidetector CT imaging of the chest was performed using the standard protocol during bolus administration of intravenous contrast. Multiplanar CT image reconstructions and MIPs were obtained to evaluate the vascular anatomy.  CONTRAST:  154mL OMNIPAQUE IOHEXOL 350 MG/ML SOLN  COMPARISON:  02/21/2006  FINDINGS: THORACIC INLET/BODY WALL:  No acute abnormality.  MEDIASTINUM:  Normal heart size. No pericardial effusion. Multi focal atherosclerosis, including the coronary arteries. No acute vascular abnormality, including pulmonary embolism or aortic dissection. Prominent mediastinal lymph nodes, likely reactive to the following.  LUNG WINDOWS:  Mild interlobular septal thickening and small layering bilateral pleural effusions. Previously noted opacity at the right base has a predominantly linear morphology, favoring atelectasis. Diffuse bronchial wall thickening which could be smoking-related or secondary to vascular congestion. There is a small area of ill-defined nodularity in the posterior segment right upper lobe.  UPPER  ABDOMEN:  Possible cholelithiasis, although incomplete imaging limits certainty.  OSSEOUS:  No acute fracture.  No suspicious lytic or blastic lesions.  Review of the MIP images confirms the above findings.  IMPRESSION: 1. Negative for pulmonary embolism. 2. Mild pulmonary edema and small bilateral pleural effusion. 3. Minimal airspace disease in the right upper lobe which could be infectious or inflammatory.   Electronically Signed   By: Jorje Guild M.D.   On: 09/01/2013 21:22    EKG: Independently reviewed.  Assessment/Plan Principal Problem:   Acute respiratory failure with hypoxia Active Problems:   Symptomatic anemia   DOE (dyspnea on exertion)   1. Acute respiratory failure with hypoxia, DOE - CT chest suggestive of a mild CHF like picture, suspicious for cardiac cause (exertional angina).  Especially with the EKG findings of ST segment depression he has had over the past couple of months, and reported ST changes with activity today at the pulmonologist's office.  Additionally his initial POC troponin is slightly elevated at 0.15 (formal lab troponin is pending). 1. Will begin treatment as a symptomatic anemia given his HGB of 7.7 (was 9.1 in Feb) and transfuse PRBC to see if this improves his symptoms.  Did warn family there is a chance that fluid overload from transfusion could make symptoms worse thus requiring use of lasix to diurese him.  Microcytic anemia: iron studies ordered, occult blood test is negative. 2. High degree of suspicion for ischemic cardiomyopathy in this patient, his HEART score even giving him only 1 point for history is a 7 at this point.  With ST segment depressions noted in inferior leads, slight troponin elevation.  There has been ongoing suspicion for CAD for a couple of months now (see notes from December admit).  Almost certainly needs cards consult in AM.  Will keep him NPO after midnight and on tele monitor.  If lab troponin comes back significantly elevated,  start heparin gtt.    Code Status: Full Code  Family Communication: Wife at bedside Disposition Plan: Admit to inpatient   Time spent: 58 min  [facility] Pager [phone]  If 7AM-7PM, please contact the day team taking care of the patient [email or web address] Password [facility] 09/01/2013, 10:23 PM

## 2013-09-01 NOTE — Assessment & Plan Note (Signed)
New onset Hypoxia with associated ST depression on EKG (? Demand ischemia )  Progressive DOE x 3 weeks s/p [place]  Pt will need acute workup for etiology of ambulatory hypoxia  Along w/ EKG changes  ?possible PE  Pt to be transported to ER for workup w/ consideration of stat CTA of chest .  Case discussed with Dr. Lamonte Sakai  With agreement in plan  Discussed with pt and wife EMS to transport emergency transport to [facility] ER .

## 2013-09-01 NOTE — ED Notes (Signed)
ED resident in room with pt.

## 2013-09-02 ENCOUNTER — Inpatient Hospital Stay (HOSPITAL_COMMUNITY): Payer: Medicare HMO

## 2013-09-02 ENCOUNTER — Other Ambulatory Visit: Payer: Self-pay

## 2013-09-02 DIAGNOSIS — F0390 Unspecified dementia without behavioral disturbance: Secondary | ICD-10-CM

## 2013-09-02 DIAGNOSIS — R05 Cough: Secondary | ICD-10-CM

## 2013-09-02 DIAGNOSIS — R059 Cough, unspecified: Secondary | ICD-10-CM

## 2013-09-02 LAB — CBC WITH DIFFERENTIAL/PLATELET
BASOS ABS: 0.1 10*3/uL (ref 0.0–0.1)
Basophils Relative: 1 % (ref 0–1)
EOS PCT: 7 % — AB (ref 0–5)
Eosinophils Absolute: 0.7 10*3/uL (ref 0.0–0.7)
HCT: 28.8 % — ABNORMAL LOW (ref 39.0–52.0)
Hemoglobin: 8.5 g/dL — ABNORMAL LOW (ref 13.0–17.0)
LYMPHS PCT: 12 % (ref 12–46)
Lymphs Abs: 1.2 10*3/uL (ref 0.7–4.0)
MCH: 20.7 pg — ABNORMAL LOW (ref 26.0–34.0)
MCHC: 29.5 g/dL — ABNORMAL LOW (ref 30.0–36.0)
MCV: 70.2 fL — ABNORMAL LOW (ref 78.0–100.0)
MONOS PCT: 9 % (ref 3–12)
Monocytes Absolute: 0.9 10*3/uL (ref 0.1–1.0)
NEUTROS PCT: 71 % (ref 43–77)
Neutro Abs: 6.8 10*3/uL (ref 1.7–7.7)
PLATELETS: 394 10*3/uL (ref 150–400)
RBC: 4.1 MIL/uL — AB (ref 4.22–5.81)
RDW: 17.9 % — AB (ref 11.5–15.5)
WBC: 9.7 10*3/uL (ref 4.0–10.5)

## 2013-09-02 LAB — TROPONIN I

## 2013-09-02 LAB — IRON AND TIBC
Iron: <10 ug/dL — ABNORMAL LOW (ref 42–135)
UIBC: 339 ug/dL (ref 125–400)

## 2013-09-02 LAB — PRO B NATRIURETIC PEPTIDE: PRO B NATRI PEPTIDE: 3066 pg/mL — AB (ref 0–450)

## 2013-09-02 LAB — GLUCOSE, CAPILLARY: GLUCOSE-CAPILLARY: 156 mg/dL — AB (ref 70–99)

## 2013-09-02 LAB — FERRITIN: Ferritin: 8 ng/mL — ABNORMAL LOW (ref 22–322)

## 2013-09-02 MED ORDER — HYDRALAZINE HCL 20 MG/ML IJ SOLN: 5.0000 mg | Freq: Four times a day (QID) | INTRAMUSCULAR | Status: DC | PRN

## 2013-09-02 MED ORDER — LEVALBUTEROL HCL 0.63 MG/3ML IN NEBU
0.6300 mg | INHALATION_SOLUTION | Freq: Four times a day (QID) | RESPIRATORY_TRACT | Status: DC
Start: 2013-09-02 — End: 2013-09-04
  Administered 2013-09-02 – 2013-09-04 (×8): 0.63 mg via RESPIRATORY_TRACT
  Filled 2013-09-02 (×16): qty 3

## 2013-09-02 MED ORDER — LEVOFLOXACIN IN D5W 500 MG/100ML IV SOLN
500.0000 mg | INTRAVENOUS | Status: DC
  Administered 2013-09-02 – 2013-09-03 (×2): 500 mg via INTRAVENOUS
  Filled 2013-09-02 (×3): qty 100

## 2013-09-02 MED ORDER — LEVALBUTEROL HCL 0.63 MG/3ML IN NEBU: 0.6300 mg | INHALATION_SOLUTION | Freq: Four times a day (QID) | RESPIRATORY_TRACT | Status: DC | PRN

## 2013-09-02 MED ORDER — LEVALBUTEROL HCL 0.63 MG/3ML IN NEBU: 0.6300 mg | INHALATION_SOLUTION | RESPIRATORY_TRACT | Status: DC | PRN

## 2013-09-02 MED ORDER — FUROSEMIDE 10 MG/ML IJ SOLN
40.0000 mg | Freq: Once | INTRAMUSCULAR | Status: AC
  Administered 2013-09-02: 40 mg via INTRAVENOUS
  Filled 2013-09-02: qty 4

## 2013-09-02 MED ORDER — IPRATROPIUM BROMIDE 0.02 % IN SOLN
0.5000 mg | Freq: Four times a day (QID) | RESPIRATORY_TRACT | Status: DC
  Administered 2013-09-02 – 2013-09-04 (×8): 0.5 mg via RESPIRATORY_TRACT
  Filled 2013-09-02 (×8): qty 2.5

## 2013-09-02 MED ORDER — LORAZEPAM 1 MG PO TABS: 1.0000 mg | ORAL_TABLET | Freq: Three times a day (TID) | ORAL | Status: DC | PRN

## 2013-09-02 MED ORDER — IPRATROPIUM BROMIDE 0.02 % IN SOLN
0.5000 mg | Freq: Four times a day (QID) | RESPIRATORY_TRACT | Status: DC | PRN
  Administered 2013-09-02: 0.5 mg via RESPIRATORY_TRACT

## 2013-09-02 MED ORDER — METHYLPREDNISOLONE SODIUM SUCC 125 MG IJ SOLR
125.0000 mg | Freq: Once | INTRAMUSCULAR | Status: AC
  Administered 2013-09-02: 125 mg via INTRAVENOUS
  Filled 2013-09-02: qty 2

## 2013-09-02 MED ORDER — DIPHENHYDRAMINE HCL 25 MG PO CAPS
ORAL_CAPSULE | ORAL | Status: AC
  Administered 2013-09-02: 01:00:00
  Filled 2013-09-02: qty 1

## 2013-09-02 MED ORDER — IPRATROPIUM BROMIDE 0.02 % IN SOLN
RESPIRATORY_TRACT | Status: AC
  Filled 2013-09-02: qty 2.5

## 2013-09-02 NOTE — Significant Event (Signed)
Rapid Response Event Note  Overview:Called to see patient in resp distress Time Called: 1218 Arrival Time: 1220 Event Type: Respiratory  Initial Focused Assessment:  On arrival alert - skin cool and moist - pale - getting Albuterol nebulizer  RR 38-42 - using accessory muscles - able to speak - broken sentences - has dementia but answers appropriately - denies pain - just states he cant' get breathe - happened suddenly when pulling self up in bed according to NT sitter and RT states he had just had his Symbicort inhaler.  ST - BP 204/97 HR 118.  12 lead done but wandering secondary to distress.  Abd soft - bil BS very distant - not moving much air - decreased chest expansion.  Trachea midline.  RN states patient had PRBC last pm.     Interventions:  Stat call for PCXR.  Dr. Karleen Hampshire on phone with RN Adrianne - Atrovent neb added.  Becoming less tachypnic - HR coming down - patient with increased chest expansion - states he feels some better - 167/103 HR 106 RR 28.  Dr. Tera Mater present - patient much better - PCXR done.  Repeated 12 lead.  BP 117/85 HR 104 ST with some PVC's.  Patient returned to baseline resp status - no distress - moving better air -lungs sound clear - still with some congested cough.  Resting without distress.  HR 94 RR 20 BP 134/88.  40 mg Lasix IV given - sitter instructed to measure urine.  Solumedrol 125 mg IV given per order.  Handoff to [employer].     Event Summary:  Stabilized and stayed in room   at  Dr. Karleen Hampshire - Dr. Conley Canal    at  1218.          Quin Hoop

## 2013-09-02 NOTE — Consult Note (Signed)
CARDIOLOGY CONSULT NOTE  Patient ID: [name] MRN: [id number] DOB/AGE: June 13, 1934 79 y.o.  Admit date: 09/01/2013 Referring Physician  [id number] Primary Physician:  Edwin Greenland, MD Reason for Consultation  Shortness of breath  HPI: Mr. [name] is a very pleasant 79 year old African American male with history of prior tobacco use disorder, severe peripheral arterial disease, emphysema, history of colon cancer in the past, Alzheimer's disease with recent progression of Alzheimer's, who was admitted directly from the office of the pulmonary medicine due to worsening shortness of breath and dyspnea on exertion and fatigue ongoing for the past 3 weeks to one month.  On admission he was found to have severe anemia, patient was hypoxemic and with the concern for pulmonary embolism, he was sent total emergency room, CT angiogram revealing no evidence of PE. During evaluation his point-of-care troponin was minimally positive and I was asked to opine. He also had mildly abnormal EKG.  Patient has no known significant coronary artery disease. Patient denies any chest pain, palpitations. Complaint is fatigue and shortness of breath. No symptoms to suggest leg edema painful swelling of the lower extremities. No PND or orthopnea.  He does have severe peripheral artery disease, but after recent revascularization to his lower extremity his, he has not complained of any leg fatigue or claudication. No ulceration or bruise discoloration. Patient's daughter present the bedside. Patient's wife over the phone.  Past Medical History  Diagnosis Date  . Hyperlipidemia   . PVD (peripheral vascular disease)   . Hypertension   . COPD (chronic obstructive pulmonary disease)   . Colon cancer   . Alzheimer's dementia 2013  . Pneumonia 2014  . Dyspnea   . Type II diabetes mellitus   . Depression      Past Surgical History  Procedure Laterality Date  . Colon surgery  1996    cancer  . Lumbar disc surgery   2005  . Vascular surgery    . Colon resection  1997    /enc. notes 05/17/2004 (04/28/2013)  . Femoral artery - femoral artery bypass graft      right to left/enc. notes 05/17/2004 (04/28/2013)  . Femoral bypass Right     /enc. notes 05/17/2004 (04/28/2013)  . Fiberoptic bronchoscopy      /enc. notes 03/16/2005 (04/28/2013)  . Cataract extraction w/ intraocular lens implant Left 01/2013  . Cataract extraction Right 02/2013  . Femoral-popliteal bypass graft Right 04/29/2013    Procedure:  FEMORAL-POPLITEAL ARTERY Bypass Graft with intraoperative ultrasound and arteriogarm;  Surgeon: Mal Misty, MD;  Location: [place];  Service: Vascular;  Laterality: Right;  . Intraoperative arteriogram Right 04/29/2013    Procedure: INTRA OPERATIVE ARTERIOGRAM;  Surgeon: Mal Misty, MD;  Location: [place];  Service: Vascular;  Laterality: Right;  . Spine surgery       Family History  Problem Relation Age of Onset  . Stroke Mother 52  . Hypertension Mother   . Cancer Sister   . Heart disease Sister   . Heart disease Brother     Heart Disease before age 36  . Heart attack Brother   . Cancer Sister   . Cancer Brother      Social History: History   Social History  . Marital Status: Married    Spouse Name: N/A    Number of Children: N/A  . Years of Education: N/A   Occupational History  . Not on file.   Social History Main Topics  . Smoking status: Former Smoker --  0.30 packs/day for 40 years    Types: Cigarettes    Quit date: 03/26/1993  . Smokeless tobacco: Never Used  . Alcohol Use: No  . Drug Use: No  . Sexual Activity: No   Other Topics Concern  . Not on file   Social History Narrative  . No narrative on file     Prescriptions prior to admission  Medication Sig Dispense Refill  . albuterol (PROVENTIL) (2.5 MG/3ML) 0.083% nebulizer solution Take 2.5 mg by nebulization every 6 (six) hours as needed for wheezing or shortness of breath.      . budesonide-formoterol (SYMBICORT) 160-4.5  MCG/ACT inhaler Inhale 2 puffs into the lungs 2 (two) times daily.      . Cholecalciferol (VITAMIN D) 2000 UNITS CAPS Take 1 capsule by mouth at bedtime.      . clopidogrel (PLAVIX) 75 MG tablet Take 75 mg by mouth daily.       . metFORMIN (GLUCOPHAGE) 500 MG tablet Take 500 mg by mouth at bedtime.      Marland Kitchen olmesartan (BENICAR) 40 MG tablet Take 40 mg by mouth daily.      . rosuvastatin (CRESTOR) 20 MG tablet Take 20 mg by mouth at bedtime.         Scheduled Meds: . atorvastatin  40 mg Oral q1800  . budesonide-formoterol  2 puff Inhalation BID  . clopidogrel  75 mg Oral Daily  . heparin  5,000 Units Subcutaneous 3 times per day  . irbesartan  300 mg Oral Daily   Continuous Infusions:  PRN Meds:.acetaminophen, albuterol, ondansetron (ZOFRAN) IV  ROS: General: no fevers/chills/night sweats Eyes: no blurry vision, diplopia, or amaurosis ENT: no sore throat or hearing loss CV: no edema or palpitations GI: no abdominal pain, nausea, vomiting, diarrhea, or constipation GU: no dysuria, frequency, or hematuria Skin: no rash Neuro: no headache, numbness, tingling, or weakness of extremities Musculoskeletal: no joint pain or swelling Heme: no bleeding, DVT, or easy bruising Endo: no polydipsia or polyuria Since negative, patient himself denies any symptoms at all.    Physical Exam: Blood pressure 139/79, pulse 98, temperature 98.4 F (36.9 C), temperature source Oral, resp. rate 18, height 5\' 11"  (1.803 m), weight 75.3 kg (166 lb 0.1 oz), SpO2 100.00%.   General appearance: alert, cooperative, appears stated age and no distress Lungs: Faint expiratory wheezes, right base slightly more prominent on the left. Emphysematous chest. Heart: regular rate and rhythm, S1, S2 normal, no murmur, click, rub or gallop and Distant heart sounds. Extremities: extremities normal, atraumatic, no cyanosis or edema Pulses: Carotid pulse normal, femoral pulse normal, popliteal pulse and pedal pulse absent.  Capillary fill normal. Neurologic: Grossly normal  Labs:   Lab Results  Component Value Date   WBC 9.7 09/02/2013   HGB 8.5* 09/02/2013   HCT 28.8* 09/02/2013   MCV 70.2* 09/02/2013   PLT 394 09/02/2013    Recent Labs Lab 09/01/13 1834  NA 142  K 4.1  CL 107  CO2 21  BUN 18  CREATININE 0.95  CALCIUM 9.2  PROT 7.1  BILITOT 0.5  ALKPHOS 80  ALT 6  AST 19  GLUCOSE 121*   Lab Results  Component Value Date   CKTOTAL 172 07/04/2012   TROPONINI <0.30 09/02/2013    Lipid Panel  No results found for this basename: chol, trig, hdl, cholhdl, vldl, ldlcalc    EKG: Normal sinus rhythm, frequent PVCs and PACs. Nonspecific T. abnormality..    Radiology: Dg Chest 2 View  09/01/2013  CLINICAL DATA:  Shortness of breath, congestion, COPD  EXAM: CHEST  2 VIEW  COMPARISON:  04/28/2013  FINDINGS: Stable hyperinflation noted compatible with COPD/ emphysema. Mild apical scarring noted. Ill-defined streaky right basilar density partially obscures the hemidiaphragm suspicious for right lower lobe airspace process or pneumonia. This is posterior on the lateral view. Left lung appears clear. Normal heart size and vascularity. Atherosclerosis of the aorta. Trachea is midline. No pneumothorax.  IMPRESSION: Hyperinflation compatible with COPD/ emphysema.  Streaky ill-defined right base opacities overlying the hemidiaphragm suspicious for subtle right basilar pneumonia.   Electronically Signed   By: Daryll Brod M.D.   On: 09/01/2013 20:10   Ct Angio Chest Pe W/cm &/or Wo Cm  09/01/2013   CLINICAL DATA:  Shortness of breath. Rule out pulmonary embolism. Dementia.  EXAM: CT ANGIOGRAPHY CHEST WITH CONTRAST  TECHNIQUE: Multidetector CT imaging of the chest was performed using the standard protocol during bolus administration of intravenous contrast. Multiplanar CT image reconstructions and MIPs were obtained to evaluate the vascular anatomy.  CONTRAST:  116mL OMNIPAQUE IOHEXOL 350 MG/ML SOLN  COMPARISON:   02/21/2006  FINDINGS: THORACIC INLET/BODY WALL:  No acute abnormality.  MEDIASTINUM:  Normal heart size. No pericardial effusion. Multi focal atherosclerosis, including the coronary arteries. No acute vascular abnormality, including pulmonary embolism or aortic dissection. Prominent mediastinal lymph nodes, likely reactive to the following.  LUNG WINDOWS:  Mild interlobular septal thickening and small layering bilateral pleural effusions. Previously noted opacity at the right base has a predominantly linear morphology, favoring atelectasis. Diffuse bronchial wall thickening which could be smoking-related or secondary to vascular congestion. There is a small area of ill-defined nodularity in the posterior segment right upper lobe.  UPPER ABDOMEN:  Possible cholelithiasis, although incomplete imaging limits certainty.  OSSEOUS:  No acute fracture.  No suspicious lytic or blastic lesions.  Review of the MIP images confirms the above findings.  IMPRESSION: 1. Negative for pulmonary embolism. 2. Mild pulmonary edema and small bilateral pleural effusion. 3. Minimal airspace disease in the right upper lobe which could be infectious or inflammatory.   Electronically Signed   By: Jorje Guild M.D.   On: 09/01/2013 21:22   Scheduled Meds: . atorvastatin  40 mg Oral q1800  . budesonide-formoterol  2 puff Inhalation BID  . clopidogrel  75 mg Oral Daily  . heparin  5,000 Units Subcutaneous 3 times per day  . irbesartan  300 mg Oral Daily   Continuous Infusions:  PRN Meds:.acetaminophen, albuterol, ondansetron (ZOFRAN) IV  ASSESSMENT AND PLAN:  1. Shortness of breath and dyspnea on exertion due to underlying COPD and severe anemia 2. Hypertension 3. History of colon cancer 4. Alzheimer's disease 5. Peripheral artery disease  Recommendation: My suspicion that this is coronary artery disease and non-ST elevation myocardial infarction and acute coronary syndrome is very very unlikely given the fact that his  troponins are negative, no obvious congestive heart failure on chest x-ray, patient laying flat in bed without any clinical evidence of congestive heart failure. His main clinical presentation of dyspnea and fatigue is probably related to severe anemia and evaluation for the etiology of anemia is indicated at this time.  Indeed if cardiac markers were to be positive for non-ST elevation myocardial infarction, I would not recommend further invasive procedures, patient with multiple medical comorbidities. Would recommend conservative therapy only. I had a long discussion with the patient's daughter and also his wife over the telephone. Patient's daughter present the bedside. Patient presently not on any antiplatelet  agents, patient has severe anemia, appears to be an and deficient, has GI bleeding needs to be excluded prior to starting antiplatelet therapy.   Laverda Page, MD 09/02/2013, 8:58 AM [facility]. [place] Pager: [phone] Office: [phone] If no answer Cell [phone]

## 2013-09-02 NOTE — Progress Notes (Signed)
Echocardiogram 2D Echocardiogram has been performed.  [name] 09/02/2013, 3:06 PM

## 2013-09-02 NOTE — Progress Notes (Signed)
Respiratory therapy note- Called to room for respiratory distress by RN. Albuterol nebulizer treatment given. Rapid response called and MD. Atrovent added to treatment per order. HR 113, RR 30 sp02 100%. BBS equal and diminished throughout with a mild end expiratory wheeze. Post treatment patient appears more comfortable and can speak in full sentences. Continue to monitor.

## 2013-09-02 NOTE — Progress Notes (Signed)
[facility] PROGRESS NOTE  [name] BHA:[id number] DOB: 25-Oct-1934 DOA: 09/01/2013 PCP: [name], MD Brief HPI: [name] is a 79 y.o. male h/o PAD, CAD, who presents to the ED from his pulmonlogists office with 3 week history of progressively worsening DOE. Patient is generally asymptomatic at rest (though per wife he did wake up in the middle of the night a couple of times over the past weeks with c/o CP and SOB); however, with ambulation today at his pulmonologist's office he was noted to desat down to 70%.  He was sent to the ED for the new onset hypoxia on exertion and pulmonologist noted concern for possible PE given his recent travel 3 weeks ago and unimpressive pulmonary findings on exam (no wheezing, etc).     Assessment/Plan:  Dyspnea on Exertion: Probably secondary to anemia. Received 1 unit of prbc transfusion.  Admitted to telemetry for evaluation of ACS. 2 sets of troponins are negative. His CXR does not appear to show fulminant CHF picture.  CT angio negative for PE. ACS is ruled out. Cardiology consulted and recommendations given. Medical management for now. Continue with plavix.   Anemia: - microcytic, low ferritin, stool for occult negative. Has a h/o colon cancer and last colonoscopy is less than 3 years ago as per the patient's wife.   Type 2 Diabetes Mellitus: -hgba1c ordered.  - SSI.   Copd: - continues to smoke.  - coughing. Will add levaquin for bronchitis.   Dementia: Pt is pleasantly confused.   PVD: - STOP smoking and continue with plavix.   DVT prophylaxis.    Code Status: full code.  Family Communication: discussed in detail with the patient;s daughter and wife  Disposition Plan: pending.    Consultants:  Cardiology Dr Einar Gip.   Procedures: none Antibiotics:  none  HPI/Subjective: comfortable denies new complaints. Daughter at bedside.  Objective: Filed Vitals:   09/02/13 0545  BP: 139/79  Pulse: 98  Temp:  98.4 F (36.9 C)  Resp: 18    Intake/Output Summary (Last 24 hours) at 09/02/13 1036 Last data filed at 09/02/13 0430  Gross per 24 hour  Intake    350 ml  Output      0 ml  Net    350 ml   Filed Weights   09/01/13 2306  Weight: 75.3 kg (166 lb 0.1 oz)    Exam:   General:  Alert confused, not agitated with safety sitter at bedside  Cardiovascular: s1s2  Respiratory: ctab  Abdomen: soft NT nd bs+  Musculoskeletal: no pedal edema.   Data Reviewed: Basic Metabolic Panel:  Recent Labs Lab 09/01/13 1834  NA 142  K 4.1  CL 107  CO2 21  GLUCOSE 121*  BUN 18  CREATININE 0.95  CALCIUM 9.2   Liver Function Tests:  Recent Labs Lab 09/01/13 1834  AST 19  ALT 6  ALKPHOS 80  BILITOT 0.5  PROT 7.1  ALBUMIN 3.4*   No results found for this basename: LIPASE, AMYLASE,  in the last 168 hours No results found for this basename: AMMONIA,  in the last 168 hours CBC:  Recent Labs Lab 09/01/13 1834 09/02/13 0700  WBC 8.5 9.7  NEUTROABS  --  6.8  HGB 7.7* 8.5*  HCT 26.9* 28.8*  MCV 69.2* 70.2*  PLT 434* 394   Cardiac Enzymes:  Recent Labs Lab 09/01/13 2207 09/02/13 0700  TROPONINI <0.30 <0.30   BNP (last 3 results) No results found for this basename: PROBNP,  in the last 8760 hours CBG: No results found for this basename: GLUCAP,  in the last 168 hours  No results found for this or any previous visit (from the past 240 hour(s)).   Studies: Dg Chest 2 View  09/01/2013   CLINICAL DATA:  Shortness of breath, congestion, COPD  EXAM: CHEST  2 VIEW  COMPARISON:  04/28/2013  FINDINGS: Stable hyperinflation noted compatible with COPD/ emphysema. Mild apical scarring noted. Ill-defined streaky right basilar density partially obscures the hemidiaphragm suspicious for right lower lobe airspace process or pneumonia. This is posterior on the lateral view. Left lung appears clear. Normal heart size and vascularity. Atherosclerosis of the aorta. Trachea is midline. No  pneumothorax.  IMPRESSION: Hyperinflation compatible with COPD/ emphysema.  Streaky ill-defined right base opacities overlying the hemidiaphragm suspicious for subtle right basilar pneumonia.   Electronically Signed   By: Daryll Brod M.D.   On: 09/01/2013 20:10   Ct Angio Chest Pe W/cm &/or Wo Cm  09/01/2013   CLINICAL DATA:  Shortness of breath. Rule out pulmonary embolism. Dementia.  EXAM: CT ANGIOGRAPHY CHEST WITH CONTRAST  TECHNIQUE: Multidetector CT imaging of the chest was performed using the standard protocol during bolus administration of intravenous contrast. Multiplanar CT image reconstructions and MIPs were obtained to evaluate the vascular anatomy.  CONTRAST:  137mL OMNIPAQUE IOHEXOL 350 MG/ML SOLN  COMPARISON:  02/21/2006  FINDINGS: THORACIC INLET/BODY WALL:  No acute abnormality.  MEDIASTINUM:  Normal heart size. No pericardial effusion. Multi focal atherosclerosis, including the coronary arteries. No acute vascular abnormality, including pulmonary embolism or aortic dissection. Prominent mediastinal lymph nodes, likely reactive to the following.  LUNG WINDOWS:  Mild interlobular septal thickening and small layering bilateral pleural effusions. Previously noted opacity at the right base has a predominantly linear morphology, favoring atelectasis. Diffuse bronchial wall thickening which could be smoking-related or secondary to vascular congestion. There is a small area of ill-defined nodularity in the posterior segment right upper lobe.  UPPER ABDOMEN:  Possible cholelithiasis, although incomplete imaging limits certainty.  OSSEOUS:  No acute fracture.  No suspicious lytic or blastic lesions.  Review of the MIP images confirms the above findings.  IMPRESSION: 1. Negative for pulmonary embolism. 2. Mild pulmonary edema and small bilateral pleural effusion. 3. Minimal airspace disease in the right upper lobe which could be infectious or inflammatory.   Electronically Signed   By: Jorje Guild M.D.    On: 09/01/2013 21:22    Scheduled Meds: . atorvastatin  40 mg Oral q1800  . budesonide-formoterol  2 puff Inhalation BID  . clopidogrel  75 mg Oral Daily  . heparin  5,000 Units Subcutaneous 3 times per day  . irbesartan  300 mg Oral Daily   Continuous Infusions:   Principal Problem:   Acute respiratory failure with hypoxia Active Problems:   Symptomatic anemia   DOE (dyspnea on exertion)    Time spent: 35 minutes    [facility] Pager [phone]. If 7PM-7AM, please contact night-coverage at [email or web address], password [facility] 09/02/2013, 10:36 AM  LOS: 1 day

## 2013-09-02 NOTE — Progress Notes (Addendum)
Asked by Dr. Karleen Hampshire to examine patient, as she is at [facility].  Patient reportedly had sudden onset respiratory distress after repositioning in bed. Rapid response nurse at bedside. Sats reportedly normal, but patient initially apparently had little air movement and accessory muscle usage. He was diaphoretic and unable to speak. He had no complaints of pain. He received a nebulizer treatment and is currently feeling better. Chart reviewed.  Patient currently without complaints. He appears pale. Oxygen saturation 100% on nasal cannula.  Filed Vitals:   09/02/13 0545 09/02/13 1212 09/02/13 1230 09/02/13 1231  BP: 139/79 204/97 167/103   Pulse: 98 119    Temp: 98.4 F (36.9 C)     TempSrc: Oral     Resp: 18     Height:      Weight:      SpO2: 100% 100%  100%   Slightly tachypnea, but able to speak in full sentences. Lungs clear to auscultation with good air movement and no wheezes or rhonchi or rales. Extremities without edema. Heart is regular rate rhythm. Repeat EKG essentially unchanged from previous. Chest x-ray is pending. We will give a dose of Lasix, as initial CT angiogram of the chest showed pulmonary edema. We'll also give a dose of Solu-Medrol, and schedule bronchodilators. Check pro BNP and get echocardiogram. Discussed with Dr. Karleen Hampshire.  Time 30 minutes  Doree Barthel, MD [facility]

## 2013-09-02 NOTE — Progress Notes (Signed)
Pt found to be in resp distress, extremely diaphoretic. Called rapid response nurse and dr. Karleen Hampshire, orders given.

## 2013-09-02 NOTE — Progress Notes (Signed)
Utilization Review Completed.Donne Anon T6/12/2013

## 2013-09-03 DIAGNOSIS — J449 Chronic obstructive pulmonary disease, unspecified: Secondary | ICD-10-CM

## 2013-09-03 DIAGNOSIS — D509 Iron deficiency anemia, unspecified: Secondary | ICD-10-CM

## 2013-09-03 LAB — TYPE AND SCREEN
ABO/RH(D): O POS
Antibody Screen: NEGATIVE
Unit division: 0

## 2013-09-03 LAB — GLUCOSE, CAPILLARY: GLUCOSE-CAPILLARY: 152 mg/dL — AB (ref 70–99)

## 2013-09-03 MED ORDER — PREDNISONE 20 MG PO TABS
40.0000 mg | ORAL_TABLET | Freq: Every day | ORAL | Status: DC
  Administered 2013-09-04: 40 mg via ORAL
  Filled 2013-09-03 (×4): qty 2

## 2013-09-03 MED ORDER — INSULIN ASPART 100 UNIT/ML ~~LOC~~ SOLN
0.0000 [IU] | Freq: Three times a day (TID) | SUBCUTANEOUS | Status: DC
  Administered 2013-09-06: 1 [IU] via SUBCUTANEOUS

## 2013-09-03 NOTE — ED Provider Notes (Signed)
I saw and evaluated the patient, reviewed the resident's note and I agree with the findings and plan.   EKG Interpretation None      Demented male here with DOE, worse over past few weeks per wife. Sent from [facility] clinic. PE study negative. CBC shows anemia, likely culprit. Admitted.  Osvaldo Shipper, MD 09/03/13 [phone]

## 2013-09-03 NOTE — Consult Note (Signed)
Subjective:   HPI  The patient is a 79 year old male who was admitted to the hospital with progressive shortness of breath over the last several weeks. He was found to be severely anemic. He has a history of progressive Alzheimer's disease. At the time I am seeing him there is no family present and I tried to call the number that was given but there was no answer from his daughter who's number I had. There is no report that I am aware of rectal bleeding or active gastrointestinal bleeding at this time. The patient is very pleasant but is unable to give a reliable history due to his Alzheimer's. From review of records he does have a history of colon cancer resected years ago. He sees my partner Dr. Oletta Lamas and the patient's last colonoscopy was on April 08, 2012 and there was a rectal ulcer found, and a lipoma in the descending colon. There was no evidence of recurrent malignancy or adenomatous polyps. The patient denies any abdominal pain at this time. There is no report to me or vomiting. In reviewing his labs it is noted that his stool for occult blood was negative. His anemia however is compatible with iron deficiency as his iron percent saturation is low as well as the serum ferratin.  Review of Systems Recent shortness of breath  Past Medical History  Diagnosis Date  . Hyperlipidemia   . PVD (peripheral vascular disease)   . Hypertension   . COPD (chronic obstructive pulmonary disease)   . Colon cancer   . Alzheimer's dementia 2013  . Pneumonia 2014  . Dyspnea   . Type II diabetes mellitus   . Depression    Past Surgical History  Procedure Laterality Date  . Colon surgery  1996    cancer  . Lumbar disc surgery  2005  . Vascular surgery    . Colon resection  1997    /enc. notes 05/17/2004 (04/28/2013)  . Femoral artery - femoral artery bypass graft      right to left/enc. notes 05/17/2004 (04/28/2013)  . Femoral bypass Right     /enc. notes 05/17/2004 (04/28/2013)  . Fiberoptic  bronchoscopy      /enc. notes 03/16/2005 (04/28/2013)  . Cataract extraction w/ intraocular lens implant Left 01/2013  . Cataract extraction Right 02/2013  . Femoral-popliteal bypass graft Right 04/29/2013    Procedure:  FEMORAL-POPLITEAL ARTERY Bypass Graft with intraoperative ultrasound and arteriogarm;  Surgeon: Mal Misty, MD;  Location: [facility];  Service: Vascular;  Laterality: Right;  . Intraoperative arteriogram Right 04/29/2013    Procedure: INTRA OPERATIVE ARTERIOGRAM;  Surgeon: Mal Misty, MD;  Location: [facility];  Service: Vascular;  Laterality: Right;  . Spine surgery     History   Social History  . Marital Status: Married    Spouse Name: N/A    Number of Children: N/A  . Years of Education: N/A   Occupational History  . Not on file.   Social History Main Topics  . Smoking status: Former Smoker -- 0.30 packs/day for 40 years    Types: Cigarettes    Quit date: 03/26/1993  . Smokeless tobacco: Never Used  . Alcohol Use: No  . Drug Use: No  . Sexual Activity: No   Other Topics Concern  . Not on file   Social History Narrative  . No narrative on file   family history includes Cancer in his brother, sister, and sister; Heart attack in his brother; Heart disease in his brother and  sister; Hypertension in his mother; Stroke (age of onset: 10) in his mother. Current facility-administered medications:acetaminophen (TYLENOL) tablet 650 mg, 650 mg, Oral, Q4H PRN, Etta Quill, DO;  atorvastatin (LIPITOR) tablet 40 mg, 40 mg, Oral, q1800, Etta Quill, DO, 40 mg at 09/03/13 1711;  budesonide-formoterol (SYMBICORT) 160-4.5 MCG/ACT inhaler 2 puff, 2 puff, Inhalation, BID, Etta Quill, DO, 2 puff at 09/03/13 0844 clopidogrel (PLAVIX) tablet 75 mg, 75 mg, Oral, Daily, Jared M Gardner, DO, 75 mg at 09/03/13 1023;  heparin injection 5,000 Units, 5,000 Units, Subcutaneous, 3 times per day, Etta Quill, DO, 5,000 Units at 09/02/13 2212;  hydrALAZINE (APRESOLINE) injection 5  mg, 5 mg, Intravenous, Q6H PRN, Hosie Poisson, MD;  ipratropium (ATROVENT) nebulizer solution 0.5 mg, 0.5 mg, Nebulization, QID, Delfina Redwood, MD, 0.5 mg at 09/03/13 1706 irbesartan (AVAPRO) tablet 300 mg, 300 mg, Oral, Daily, Etta Quill, DO, 300 mg at 09/03/13 1023;  levalbuterol (XOPENEX) nebulizer solution 0.63 mg, 0.63 mg, Nebulization, QID, Delfina Redwood, MD, 0.63 mg at 09/03/13 1706;  levalbuterol (XOPENEX) nebulizer solution 0.63 mg, 0.63 mg, Nebulization, Q4H PRN, Hosie Poisson, MD;  levofloxacin (LEVAQUIN) IVPB 500 mg, 500 mg, Intravenous, Q24H, Hosie Poisson, MD, 500 mg at 09/03/13 1024 LORazepam (ATIVAN) tablet 1 mg, 1 mg, Oral, Q8H PRN, Hosie Poisson, MD;  ondansetron (ZOFRAN) injection 4 mg, 4 mg, Intravenous, Q6H PRN, Etta Quill, DO No Known Allergies   Objective:     BP 134/77  Pulse 92  Temp(Src) 97.5 F (36.4 C) (Oral)  Resp 19  Ht 5\' 11"  (1.803 m)  Wt 72.3 kg (159 lb 6.3 oz)  BMI 22.24 kg/m2  SpO2 96%  He does not appear in any acute distress  Nonicteric  Heart regular rhythm no murmurs  Lungs clear  Abdomen: Bowel sounds normal, soft, nontender  Laboratory No components found with this basename: d1      Assessment:     Iron deficiency anemia      Plan:     He appears to be stable at this time with no evidence of active bleeding. He had a colonoscopy in January 2014 with the above-mentioned findings. I will discuss with his family tomorrow if I can get in touch with them as to further evaluation, and try to find out if they haven't noticed any particular symptoms related to the upper GI tract which may lead [place] to evaluate that as the next step.  Lab Results  Component Value Date   HGB 8.5* 09/02/2013   HGB 7.7* 09/01/2013   HGB 9.1* 04/30/2013   HCT 28.8* 09/02/2013   HCT 26.9* 09/01/2013   HCT 30.4* 04/30/2013   ALKPHOS 80 09/01/2013   ALKPHOS 72 04/28/2013   ALKPHOS 78 03/23/2013   AST 19 09/01/2013   AST 15 04/28/2013   AST 20 03/23/2013    ALT 6 09/01/2013   ALT 5 04/28/2013   ALT 7 03/23/2013

## 2013-09-03 NOTE — Progress Notes (Signed)
PROGRESS NOTE  EASTIN SWING MLJ:[id number] DOB: [birth year] DOA: 09/01/2013 PCP: [name], MD  Summary: 79 year old man presented with progressive dyspnea on exertion for 3 weeks, asymptomatic at rest, hypoxic with ambulation his pulmonologist office. He was sent to the emergency department for further evaluation of hypoxia. Initial evaluation revealed acute respiratory failure with hypoxia, mild CHF , possible exertional angina., Positive troponin and reported EKG changes, anemia  Assessment/Plan: 1. Acute hypoxic respiratory failure. Thought secondary to COPD. 2. Symptomatic anemia. Per cardiology acute coronary syndrome doubted. History of colon cancer with last colonoscopy less than 3 years ago per wife. 3. Elevated troponin. Not currently on antiplatelet agents. Recommend GI evaluation before starting antiplatelet agents per cardiology. No further evaluation suggested by cardiology. 4. COPD possible exacerbation. Appears much improved with bronchodilators and steroid treatment. 5. Diabetes mellitus type 2 6. Alzheimer's dementia. Appears to baseline.  7. Tobacco dependence. Recommend cessation.   Continue bronchodilators, antibiotics, add steroids. Overall improving. Hypoxic respiratory failure resolved.  GI consultation for further evaluation of suspected symptomatic anemia, suspect chronic GI bleed.  Sliding-scale insulin.  Physical therapy evaluation  Discussed in detail with wife and daughter at bedside.  Code Status: Full code DVT prophylaxis: SCDs Family Communication:  Disposition Plan: Pending  Murray Hodgkins, MD  [facility]  Pager [phone] If 7PM-7AM, please contact night-coverage at [email or web address], password [email or web address] 09/03/2013, 1:07 PM  LOS: 2 days   Consultants:  Cardiology  Procedures:    Antibiotics:  Levaquin  HPI/Subjective: Episode of respiratory distress yesterday improved with bronchodilators.  Much better today, breathing better,  denies pain, but history unreliable secondary to dementia.  Objective: Filed Vitals:   09/03/13 0500 09/03/13 0630 09/03/13 0852 09/03/13 1221  BP:  139/84    Pulse:  87    Temp:  98.3 F (36.8 C)    TempSrc:  Oral    Resp:  19    Height:      Weight: 72.3 kg (159 lb 6.3 oz)     SpO2:  99% 99% 98%    Intake/Output Summary (Last 24 hours) at 09/03/13 1307 Last data filed at 09/02/13 1700  Gross per 24 hour  Intake     60 ml  Output   1750 ml  Net  -1690 ml     Filed Weights   09/01/13 2306 09/03/13 0500  Weight: 75.3 kg (166 lb 0.1 oz) 72.3 kg (159 lb 6.3 oz)    Exam:   Afebrile, vital signs are stable. No hypoxia. Gen. Appears calm and comfortable sitting in chair Psych. Grossly normal mood and affect Cardiovascular. RRR no m/r/g, no LE edema Respiratory.  Few wheezes, no rales or rhonchi. Normal resp effort.  Data Reviewed:  Complete metabolic panel unremarkable on admission  Troponins negative after initial point of care  BNP 3066 yesterday  Iron level less than 10, ferritin 8  Hemoglobin 8.5 yesterday.  No labs today. Chest x-ray yesterday with asymmetric edema or infection.  CT chest angiogram on admission with possible right upper lobe airspace disease. Pulmonary edema. No PE.   Scheduled Meds: . atorvastatin  40 mg Oral q1800  . budesonide-formoterol  2 puff Inhalation BID  . clopidogrel  75 mg Oral Daily  . heparin  5,000 Units Subcutaneous 3 times per day  . ipratropium  0.5 mg Nebulization QID  . irbesartan  300 mg Oral Daily  . levalbuterol  0.63 mg Nebulization QID  . levofloxacin (LEVAQUIN) IV  500 mg Intravenous Q24H   Continuous Infusions:  Principal Problem:   Acute respiratory failure with hypoxia Active Problems:   Symptomatic anemia   DOE (dyspnea on exertion)   Microcytic anemia   Time spent 20 minutes

## 2013-09-04 DIAGNOSIS — J441 Chronic obstructive pulmonary disease with (acute) exacerbation: Secondary | ICD-10-CM

## 2013-09-04 LAB — CBC
HCT: 30.6 % — ABNORMAL LOW (ref 39.0–52.0)
Hemoglobin: 8.8 g/dL — ABNORMAL LOW (ref 13.0–17.0)
MCH: 20.1 pg — ABNORMAL LOW (ref 26.0–34.0)
MCHC: 28.8 g/dL — ABNORMAL LOW (ref 30.0–36.0)
MCV: 69.9 fL — ABNORMAL LOW (ref 78.0–100.0)
PLATELETS: 434 10*3/uL — AB (ref 150–400)
RBC: 4.38 MIL/uL (ref 4.22–5.81)
RDW: 18.1 % — ABNORMAL HIGH (ref 11.5–15.5)
WBC: 11.7 10*3/uL — AB (ref 4.0–10.5)

## 2013-09-04 LAB — GLUCOSE, CAPILLARY
GLUCOSE-CAPILLARY: 142 mg/dL — AB (ref 70–99)
Glucose-Capillary: 112 mg/dL — ABNORMAL HIGH (ref 70–99)
Glucose-Capillary: 147 mg/dL — ABNORMAL HIGH (ref 70–99)
Glucose-Capillary: 155 mg/dL — ABNORMAL HIGH (ref 70–99)

## 2013-09-04 MED ORDER — LEVOFLOXACIN 500 MG PO TABS
500.0000 mg | ORAL_TABLET | Freq: Every day | ORAL | Status: DC
  Administered 2013-09-04 – 2013-09-07 (×4): 500 mg via ORAL
  Filled 2013-09-04 (×4): qty 1

## 2013-09-04 MED ORDER — IPRATROPIUM BROMIDE 0.02 % IN SOLN
0.5000 mg | Freq: Four times a day (QID) | RESPIRATORY_TRACT | Status: DC
  Administered 2013-09-04: 0.5 mg via RESPIRATORY_TRACT
  Filled 2013-09-04: qty 2.5

## 2013-09-04 MED ORDER — IPRATROPIUM BROMIDE 0.02 % IN SOLN
0.5000 mg | Freq: Three times a day (TID) | RESPIRATORY_TRACT | Status: DC
  Administered 2013-09-05 – 2013-09-07 (×7): 0.5 mg via RESPIRATORY_TRACT
  Filled 2013-09-04 (×7): qty 2.5

## 2013-09-04 MED ORDER — LEVALBUTEROL HCL 0.63 MG/3ML IN NEBU
0.6300 mg | INHALATION_SOLUTION | Freq: Three times a day (TID) | RESPIRATORY_TRACT | Status: DC
  Administered 2013-09-05 – 2013-09-07 (×7): 0.63 mg via RESPIRATORY_TRACT
  Filled 2013-09-04 (×15): qty 3

## 2013-09-04 MED ORDER — SODIUM CHLORIDE 0.9 % IV SOLN
INTRAVENOUS | Status: DC
  Administered 2013-09-05: 06:00:00 via INTRAVENOUS

## 2013-09-04 MED ORDER — LEVALBUTEROL HCL 0.63 MG/3ML IN NEBU
0.6300 mg | INHALATION_SOLUTION | Freq: Four times a day (QID) | RESPIRATORY_TRACT | Status: DC
  Administered 2013-09-04: 0.63 mg via RESPIRATORY_TRACT
  Filled 2013-09-04: qty 3

## 2013-09-04 NOTE — Progress Notes (Signed)
Pt ambulated in hallway around nursing station approx 260ft. Pt became sob o2 sats decreased down into the 84%, applied 2lnc pt sats increase to the 90's. Pt resting sats 96% on rma.                          5

## 2013-09-04 NOTE — Progress Notes (Signed)
SATURATION QUALIFICATIONS: (This note is used to comply with regulatory documentation for home oxygen)  Patient Saturations on Room Air at Rest = 96%  Patient Saturations on Room Air while Ambulating = 84%  Patient Saturations on 2 Liters of oxygen while Ambulating = 94%  Please briefly explain why patient needs home oxygen:

## 2013-09-04 NOTE — Progress Notes (Signed)
  PROGRESS NOTE  [name] ZLD:[id number] DOB: Aug 27, 1934 DOA: 09/01/2013 PCP: [name], MD  Summary: 79 year old man presented with progressive dyspnea on exertion for 3 weeks, asymptomatic at rest, hypoxic with ambulation his pulmonologist office. He was sent to the emergency department for further evaluation of hypoxia. Initial evaluation revealed acute respiratory failure with hypoxia, mild CHF , possible exertional angina., Positive troponin and reported EKG changes, anemia  Assessment/Plan: 1. Acute hypoxic respiratory failure. Thought secondary to COPD. resolved with standard treatment. 2. Symptomatic anemia. Further investigation per GI with EGD planned tomorrow. History of colon cancer with last colonoscopy less than 3 years ago per wife. 3. Elevated troponin on admission. Subsequent troponins negative. No further evaluation suggested by cardiology. 4. COPD possible exacerbation. Appears resolved with bronchodilators and steroid treatment. 5. Diabetes mellitus type 2. Will control. 6. Alzheimer's dementia. Appears to baseline.  7. Tobacco dependence. Recommend cessation.   Continue treatment for COPD.  Further investigation of anemia per gastroenterology.  Code Status: Full code DVT prophylaxis: SCDs Family Communication:  Disposition Plan: Pending  Edwin Hodgkins, MD  [facility]  Pager [phone] If 7PM-7AM, please contact night-coverage at [email or web address], password [facility] 09/04/2013, 1:31 PM  LOS: 3 days   Consultants:  Cardiology  Procedures:    Antibiotics:  Levaquin 6/10 >>   HPI/Subjective: No complaints. Breathing fine. History is unreliable secondary to dementia.  Objective: Filed Vitals:   09/04/13 0346 09/04/13 0720 09/04/13 0847 09/04/13 1135  BP: 143/56     Pulse: 89 90 90 91  Temp: 97.5 F (36.4 C)     TempSrc: Oral     Resp: 20 20 20 20   Height:      Weight: 74.707 kg (164 lb 11.2 oz)     SpO2: 100% 98% 98% 98%     Intake/Output Summary (Last 24 hours) at 09/04/13 1331 Last data filed at 09/04/13 1300  Gross per 24 hour  Intake    480 ml  Output    350 ml  Net    130 ml     Filed Weights   09/01/13 2306 09/03/13 0500 09/04/13 0346  Weight: 75.3 kg (166 lb 0.1 oz) 72.3 kg (159 lb 6.3 oz) 74.707 kg (164 lb 11.2 oz)    Exam:  Gen. Appears calm and comfortable. Speech fluent and clear. Cardiovascular. Regular rate and rhythm. No murmur, rub or gallop. No lower extremity edema. Telemetry PVCs. Respiratory. Clear to auscultation bilaterally. No wheezes, rales or rhonchi. Normal respiratory effort. Neurologic. Appears pleasantly confused.  Data Reviewed:  Hemoglobin stable 8.8.  Scheduled Meds: . atorvastatin  40 mg Oral q1800  . budesonide-formoterol  2 puff Inhalation BID  . clopidogrel  75 mg Oral Daily  . insulin aspart  0-9 Units Subcutaneous TID WC  . ipratropium  0.5 mg Nebulization Q6H  . irbesartan  300 mg Oral Daily  . levalbuterol  0.63 mg Nebulization Q6H  . levofloxacin (LEVAQUIN) IV  500 mg Intravenous Q24H  . predniSONE  40 mg Oral Q breakfast   Continuous Infusions:   Principal Problem:   Acute respiratory failure with hypoxia Active Problems:   COPD   Symptomatic anemia   DOE (dyspnea on exertion)   Microcytic anemia   COPD exacerbation   Time spent 15 minutes

## 2013-09-04 NOTE — Progress Notes (Signed)
[facility] Progress Note  Subjective: No complaints. I was able to speak to his daughter and his wife today. They do not report any signs of any bleeding that they are aware of. No obvious complaints of definite pains either. I discussed further evaluation of his iron deficiency anemia. I mentioned that he had a colonoscopy in January 2014. I think the next logical thing to do would be an upper endoscopy to see if anything might be going on in the upper GI tract which might explain his anemia.  Objective: Vital signs in last 24 hours: Temp:  [97.5 F (36.4 C)-97.9 F (36.6 C)] 97.5 F (36.4 C) (06/12 0346) Pulse Rate:  [89-99] 90 (06/12 0847) Resp:  [18-20] 20 (06/12 0847) BP: (134-144)/(56-83) 143/56 mmHg (06/12 0346) SpO2:  [92 %-100 %] 98 % (06/12 0847) Weight:  [74.707 kg (164 lb 11.2 oz)] 74.707 kg (164 lb 11.2 oz) (06/12 0346) Weight change: 2.407 kg (5 lb 4.9 oz)   PE  No distress  Nonicteric  Heart regular rhythm  Lungs clear  Abdomen: Soft nontender  Lab Results: Results for orders placed during the hospital encounter of 09/01/13 (from the past 24 hour(s))  GLUCOSE, CAPILLARY     Status: Abnormal   Collection Time    09/03/13  9:23 PM      Result Value Ref Range   Glucose-Capillary 152 (*) 70 - 99 mg/dL  CBC     Status: Abnormal   Collection Time    09/04/13  4:30 AM      Result Value Ref Range   WBC 11.7 (*) 4.0 - 10.5 K/uL   RBC 4.38  4.22 - 5.81 MIL/uL   Hemoglobin 8.8 (*) 13.0 - 17.0 g/dL   HCT 30.6 (*) 39.0 - 52.0 %   MCV 69.9 (*) 78.0 - 100.0 fL   MCH 20.1 (*) 26.0 - 34.0 pg   MCHC 28.8 (*) 30.0 - 36.0 g/dL   RDW 18.1 (*) 11.5 - 15.5 %   Platelets 434 (*) 150 - 400 K/uL  GLUCOSE, CAPILLARY     Status: Abnormal   Collection Time    09/04/13  6:11 AM      Result Value Ref Range   Glucose-Capillary 112 (*) 70 - 99 mg/dL    Studies/Results: No results found.    Assessment: Iron deficiency anemia of uncertain etiology  Plan: We  will plan for EGD tomorrow. My partner Dr. Michail Sermon will do it. I discussed the procedure with his wife and daughter along with potential risks. They will consent.    Lacie Landry F 09/04/2013, 11:00 AM

## 2013-09-05 ENCOUNTER — Encounter (HOSPITAL_COMMUNITY)
Admission: EM | Disposition: A | Discharge status: Home Health | Payer: Self-pay | Source: Home / Self Care | Attending: Family Medicine

## 2013-09-05 ENCOUNTER — Encounter (HOSPITAL_COMMUNITY): Payer: Self-pay

## 2013-09-05 DIAGNOSIS — I059 Rheumatic mitral valve disease, unspecified: Secondary | ICD-10-CM

## 2013-09-05 DIAGNOSIS — J189 Pneumonia, unspecified organism: Secondary | ICD-10-CM

## 2013-09-05 HISTORY — PX: ESOPHAGOGASTRODUODENOSCOPY: SHX5428

## 2013-09-05 LAB — MAGNESIUM: Magnesium: 2.1 mg/dL (ref 1.5–2.5)

## 2013-09-05 LAB — BASIC METABOLIC PANEL
BUN: 22 mg/dL (ref 6–23)
CHLORIDE: 102 meq/L (ref 96–112)
CO2: 22 mEq/L (ref 19–32)
CREATININE: 0.88 mg/dL (ref 0.50–1.35)
Calcium: 9.1 mg/dL (ref 8.4–10.5)
GFR calc Af Amer: >90 mL/min (ref >90)
GFR calc non Af Amer: 81 mL/min — ABNORMAL LOW (ref >90)
Glucose, Bld: 120 mg/dL — ABNORMAL HIGH (ref 70–99)
POTASSIUM: 4.1 meq/L (ref 3.7–5.3)
Sodium: 139 mEq/L (ref 137–147)

## 2013-09-05 LAB — GLUCOSE, CAPILLARY
GLUCOSE-CAPILLARY: 100 mg/dL — AB (ref 70–99)
Glucose-Capillary: 121 mg/dL — ABNORMAL HIGH (ref 70–99)
Glucose-Capillary: 99 mg/dL (ref 70–99)

## 2013-09-05 SURGERY — EGD (ESOPHAGOGASTRODUODENOSCOPY)
Anesthesia: Moderate Sedation

## 2013-09-05 MED ORDER — PANTOPRAZOLE SODIUM 40 MG IV SOLR
40.0000 mg | Freq: Two times a day (BID) | INTRAVENOUS | Status: DC
  Administered 2013-09-05 – 2013-09-06 (×3): 40 mg via INTRAVENOUS
  Filled 2013-09-05 (×4): qty 40

## 2013-09-05 MED ORDER — NYSTATIN 100000 UNIT/ML MT SUSP
5.0000 mL | Freq: Four times a day (QID) | OROMUCOSAL | Status: DC
  Administered 2013-09-05 – 2013-09-07 (×8): 500000 [IU] via ORAL
  Filled 2013-09-05 (×12): qty 5

## 2013-09-05 MED ORDER — DIGOXIN 125 MCG PO TABS
0.1250 mg | ORAL_TABLET | Freq: Every day | ORAL | Status: DC
  Administered 2013-09-06 – 2013-09-07 (×2): 0.125 mg via ORAL
  Filled 2013-09-05 (×2): qty 1

## 2013-09-05 MED ORDER — PREDNISONE 20 MG PO TABS
20.0000 mg | ORAL_TABLET | Freq: Every day | ORAL | Status: DC
  Administered 2013-09-06 – 2013-09-07 (×2): 20 mg via ORAL
  Filled 2013-09-05 (×3): qty 1

## 2013-09-05 MED ORDER — FENTANYL CITRATE 0.05 MG/ML IJ SOLN
INTRAMUSCULAR | Status: AC
  Filled 2013-09-05: qty 2

## 2013-09-05 MED ORDER — MIDAZOLAM HCL 10 MG/2ML IJ SOLN
INTRAMUSCULAR | Status: DC | PRN
  Administered 2013-09-05: 2 mg via INTRAVENOUS
  Administered 2013-09-05: 1 mg via INTRAVENOUS

## 2013-09-05 MED ORDER — BUTAMBEN-TETRACAINE-BENZOCAINE 2-2-14 % EX AERO
INHALATION_SPRAY | CUTANEOUS | Status: DC | PRN
  Administered 2013-09-05: 2 via TOPICAL

## 2013-09-05 MED ORDER — METOPROLOL TARTRATE 12.5 MG HALF TABLET
12.5000 mg | ORAL_TABLET | Freq: Two times a day (BID) | ORAL | Status: DC
  Administered 2013-09-05 – 2013-09-07 (×5): 12.5 mg via ORAL
  Filled 2013-09-05 (×6): qty 1

## 2013-09-05 MED ORDER — MIDAZOLAM HCL 5 MG/ML IJ SOLN
INTRAMUSCULAR | Status: AC
Start: 2013-09-05 — End: 2013-09-05
  Filled 2013-09-05: qty 2

## 2013-09-05 MED ORDER — FENTANYL CITRATE 0.05 MG/ML IJ SOLN
INTRAMUSCULAR | Status: DC | PRN
  Administered 2013-09-05: 25 ug via INTRAVENOUS

## 2013-09-05 NOTE — H&P (View-Only) (Signed)
[facility] Progress Note  Subjective: No complaints. I was able to speak to his daughter and his wife today. They do not report any signs of any bleeding that they are aware of. No obvious complaints of definite pains either. I discussed further evaluation of his iron deficiency anemia. I mentioned that he had a colonoscopy in January 2014. I think the next logical thing to do would be an upper endoscopy to see if anything might be going on in the upper GI tract which might explain his anemia.  Objective: Vital signs in last 24 hours: Temp:  [97.5 F (36.4 C)-97.9 F (36.6 C)] 97.5 F (36.4 C) (06/12 0346) Pulse Rate:  [89-99] 90 (06/12 0847) Resp:  [18-20] 20 (06/12 0847) BP: (134-144)/(56-83) 143/56 mmHg (06/12 0346) SpO2:  [92 %-100 %] 98 % (06/12 0847) Weight:  [74.707 kg (164 lb 11.2 oz)] 74.707 kg (164 lb 11.2 oz) (06/12 0346) Weight change: 2.407 kg (5 lb 4.9 oz)   PE  No distress  Nonicteric  Heart regular rhythm  Lungs clear  Abdomen: Soft nontender  Lab Results: Results for orders placed during the hospital encounter of 09/01/13 (from the past 24 hour(s))  GLUCOSE, CAPILLARY     Status: Abnormal   Collection Time    09/03/13  9:23 PM      Result Value Ref Range   Glucose-Capillary 152 (*) 70 - 99 mg/dL  CBC     Status: Abnormal   Collection Time    09/04/13  4:30 AM      Result Value Ref Range   WBC 11.7 (*) 4.0 - 10.5 K/uL   RBC 4.38  4.22 - 5.81 MIL/uL   Hemoglobin 8.8 (*) 13.0 - 17.0 g/dL   HCT 30.6 (*) 39.0 - 52.0 %   MCV 69.9 (*) 78.0 - 100.0 fL   MCH 20.1 (*) 26.0 - 34.0 pg   MCHC 28.8 (*) 30.0 - 36.0 g/dL   RDW 18.1 (*) 11.5 - 15.5 %   Platelets 434 (*) 150 - 400 K/uL  GLUCOSE, CAPILLARY     Status: Abnormal   Collection Time    09/04/13  6:11 AM      Result Value Ref Range   Glucose-Capillary 112 (*) 70 - 99 mg/dL    Studies/Results: No results found.    Assessment: Iron deficiency anemia of uncertain etiology  Plan: We  will plan for EGD tomorrow. My partner Dr. Michail Sermon will do it. I discussed the procedure with his wife and daughter along with potential risks. They will consent.    Meko Masterson F 09/04/2013, 11:00 AM

## 2013-09-05 NOTE — Brief Op Note (Addendum)
Candida esophagitis. Erosive esophagitis. Minimal gastritis. Tiny gastric ulceration. No active bleeding or blood products seen. Discussed with wife and son in endoscopy.

## 2013-09-05 NOTE — Interval H&P Note (Signed)
History and Physical Interval Note:  09/05/2013 9:38 AM  [name]  has presented today for surgery, with the diagnosis of iron def anemia  The various methods of treatment have been discussed with the patient and family. After consideration of risks, benefits and other options for treatment, the patient has consented to  Procedure(s): ESOPHAGOGASTRODUODENOSCOPY (EGD) (N/A) as a surgical intervention .  The patient's history has been reviewed, patient examined, no change in status, stable for surgery.  I have reviewed the patient's chart and labs.  Questions were answered to the patient's satisfaction.     Porum C.

## 2013-09-05 NOTE — Progress Notes (Addendum)
PROGRESS NOTE  [name] WUJ:[id number] DOB: 02/01/1935 DOA: 09/01/2013 PCP: [name], MD  Addendum Echo discussed with Dr. Einar Gip, he will follow-up in AM, for now recommends beta-blocker, ARB, low dose digoxin.  Summary: 79 year old man presented with progressive dyspnea on exertion for 3 weeks, asymptomatic at rest, hypoxic with ambulation his pulmonologist office. He was sent to the emergency department for further evaluation of hypoxia. Initial evaluation revealed acute respiratory failure with hypoxia, mild CHF , possible exertional angina., Positive troponin and reported EKG changes, anemia  Assessment/Plan: 1. Acute hypoxic respiratory failure. No hypoxia except with ambulation as documented. Etiology unclear, favor progression of COPD, possible pneumonia, anemia in combination. Also consider heart failure with elevated BNP but thought less likely, see discussion below. Will likely need home oxygen at time of discharge. 2. Symptomatic anemia. Further investigation per GI with EGD planned today. History of colon cancer with last colonoscopy less than 3 years ago per wife. 3. Elevated troponin on admission. Subsequent troponins negative.  4. COPD possible exacerbation. Appears resolved with bronchodilators and steroid treatment. 5. Community acquired pneumonia. 6. Borderline left ventricular dysfunction. LVEF 45-50% with severe hypokinesis of the inferolateral and inferior myocardium in the distribution of the right coronary artery by echocardiogram December 2014. Appears euvolemic. 7. Diabetes mellitus type 2. Well controlled. 8. Alzheimer's dementia. Stable, at baseline 9. Tobacco dependence. Recommend cessation.   Consult case management for home oxygen  Physical therapy consultation  EGD today to further assess anemia, CBC in the morning  History of borderline LV dysfunction as well as ongoing trigeminy and PVCs. Magnesium and potassium normal. Case discussed in detail  today with the patient's cardiologist Dr. Einar Gip, he recommends repeating echocardiogram to exclude worsening of systolic function, low-dose beta blocker (metoprolol). Although the BNP was elevated on admission and imaging with equivocal findings, his clinical impression at this point is more COPD, deconditioning and anemia. Patient was on Plavix for PAD, recommends stopping and starting ASA 81 if ok with GI.  Continue antibiotics total 8 days. Wean steroids.  Continue bronchodilators.  Possible discharge one to 2 days.  Code Status: Full code DVT prophylaxis: SCDs Family Communication:  Disposition Plan: Pending  Murray Hodgkins, MD  [facility]  Pager [phone] If 7PM-7AM, please contact night-coverage at [email or web address], password [email or web address] 09/05/2013, 8:42 AM  LOS: 4 days   Consultants:  Cardiology  Procedures:  Transfusion one unit packed red blood cells 6/  Antibiotics:  Levaquin 6/10 >> 6/17  HPI/Subjective: No problems overnight. Patient denies complaints, no shortness of breath or pain. He was able to ambulate well with nursing yesterday but was hypoxic and dipstick on room air with ambulation.  Objective: Filed Vitals:   09/04/13 1135 09/04/13 1413 09/04/13 2055 09/05/13 0400  BP:  115/73 117/59 136/91  Pulse: 91 82 87 98  Temp:  97 F (36.1 C) 98.1 F (36.7 C) 97.8 F (36.6 C)  TempSrc:  Oral Oral Oral  Resp: 20 18 20 18   Height:      Weight:    74.707 kg (164 lb 11.2 oz)  SpO2: 98% 99% 93% 98%    Intake/Output Summary (Last 24 hours) at 09/05/13 0842 Last data filed at 09/04/13 1900  Gross per 24 hour  Intake    600 ml  Output      0 ml  Net    600 ml     Filed Weights   09/03/13 0500 09/04/13 0346 09/05/13 0400  Weight: 72.3 kg (159 lb 6.3 oz) 74.707 kg (  164 lb 11.2 oz) 74.707 kg (164 lb 11.2 oz)    Exam:  Afebrile, vital signs are stable. No hypoxia on rare arrest. Gen. Appears calm and comfortable. Psych. Grossly normal mood and affect.  Speech fluent and clear. Cardiovascular. Regular rate and rhythm. No murmur, rub or gallop. No lower extremity edema. Telemetry trigeminy. Respiratory. Clear to auscultation bilaterally, no wheezes rales or rhonchi. Normal respiratory effort. Basic metabolic panel unremarkable. Normal potassium and magnesium.  Data Reviewed:  Capillary blood sugars are stable.  Basic metabolic panel unremarkable  Scheduled Meds: . atorvastatin  40 mg Oral q1800  . budesonide-formoterol  2 puff Inhalation BID  . clopidogrel  75 mg Oral Daily  . insulin aspart  0-9 Units Subcutaneous TID WC  . ipratropium  0.5 mg Nebulization TID  . irbesartan  300 mg Oral Daily  . levalbuterol  0.63 mg Nebulization TID  . levofloxacin  500 mg Oral Daily  . predniSONE  40 mg Oral Q breakfast   Continuous Infusions: . sodium chloride 20 mL/hr at 09/05/13 5397    Principal Problem:   Acute respiratory failure with hypoxia Active Problems:   COPD   Symptomatic anemia   DOE (dyspnea on exertion)   Microcytic anemia   COPD exacerbation   CAP (community acquired pneumonia)   Time spent 35 minutes, greater than 50% in counseling and coordination of care.

## 2013-09-05 NOTE — Care Management Note (Signed)
CARE MANAGEMENT NOTE 09/05/2013  Patient:  [name], [name]   Account Number:  [id number]  Date Initiated:  09/05/2013  Documentation initiated by:  Marthenia Rolling  Subjective/Objective Assessment:   acute hypoxic respiratory failure. copd     Action/Plan:   lives with wife. will need home02 and home health   Anticipated DC Date:  09/06/2013   Anticipated DC Plan:  [place]  In-house referral  NA      DC Planning Services  CM consult      [facility] Choice  [place]   Choice offered to / List presented to:  C-3 Spouse   DME arranged  [facility]      DME agency  [place]     HH arranged  [place] RN      [place].   Status of service:  Completed, signed off Medicare Important Message given?   (If response is "NO", the following Medicare IM given date fields will be blank) Date Medicare IM given:   Date Additional Medicare IM given:    Discharge Disposition:  [place]  Per UR Regulation:    If discussed at Long Length of Stay Meetings, dates discussed:    Comments:  09/05/13--1448-- ATIKAHALLRNCM [phone] Call to [facility] to confirm that orders and paperwork had been received for home o2. States they are submitting information through insurance authorization and next step will be for delivery of 02. They have case manager's number to contact if there is an issue. Patient likely for discharge tomorrow or so. ATIKAHALLRNCM [phone]    09/05/13-- 1315-- [place] [phone] Patient to have home health RN/PT services and home o2. Spoke with patient's nurse. Likely discharge tomorrow. [employer] for DME due to contract with [employer]. Spoke with Imelda Pillow-- [phone] to request 02 be delivered to patient's room prior to discharge. Necessary paperwork faxed to [facility] at [phone] with faxed confirmation of receipt received. SPoke with patient's wife to offer choice regarding home  health. [facility] used in the past and wife chooses [facility] again. Call made to [facility] with [facility] to make aware of referral for PT/RN services. Elsie Ra, [place]

## 2013-09-05 NOTE — Progress Notes (Signed)
  Echocardiogram 2D Echocardiogram has been performed.  Marquia Costello FRANCES 09/05/2013, 12:25 PM

## 2013-09-05 NOTE — Op Note (Signed)
[facility] [place], [address]   ENDOSCOPY PROCEDURE REPORT  PATIENT: [name], [name]  MR#: [id number] BIRTHDATE: June 27, 1934 , 81  yrs. old GENDER: Male  ENDOSCOPIST: Wilford Corner, MD REFERRED BY:  PROCEDURE DATE:  09/05/2013 PROCEDURE:   EGD, diagnostic ASA CLASS:   Class III INDICATIONS:Iron deficiency anemia. MEDICATIONS: Fentanyl 25 mcg IV, Versed 3 mg IV, and Cetacaine spray x 2  TOPICAL ANESTHETIC:  DESCRIPTION OF PROCEDURE:   After the risks benefits and alternatives of the procedure were thoroughly explained, informed consent was obtained.  The Pentax Gastroscope [id number]  endoscope was introduced through the mouth and advanced to the second portion of the duodenum , limited by Without limitations.   The instrument was slowly withdrawn as the mucosa was fully examined.     FINDINGS: The endoscope was inserted into the oropharynx and esophagus was intubated.  Diffuse white plaques noted in the proximal and mid-esophagus consistent with Candida esophagitis. In the distal esophagus were patchy shallow ulcers without active bleeding consistent with erosive esophagitis. The gastroesophageal junction was noted to be 45 cm from the incisors.  Endoscope was advanced into the stomach, which revealed a tiny clean-based ulceration in the antrum. Minimal erythema noted in the distal stomach.  The endoscope was advanced to the duodenal bulb, which revealed a few small red spots and erythema consistent with minimal duodenitis. The second portion of duodenum was unremarkable.  The endoscope was withdrawn back into the stomach and retroflexion revealed a small hiatal hernia. No active bleeding or blood products seen.  COMPLICATIONS: None  ENDOSCOPIC IMPRESSION:     1. Candida Esophagitis 2. Moderate Erosive Esophagitis 3. Mild Antral Gastritis 4. Gastric Ulceration 5. Small Hiatal Hernia 6. Minimal Duodenitis 7. Anemia likely  multifactorial but esophagitis likely contributing to it  RECOMMENDATIONS: Start IV PPI Q 12 hours; Start PO Nystatin; Clear liquids; advance as tolerated; No further GI eval at this time.   REPEAT EXAM: N/A  _______________________________ Wilford Corner, MD eSigned:  Wilford Corner, MD 09/05/2013 10:11 AM    CC:  PATIENT NAME:  [name], [name] MR#: [id number]

## 2013-09-05 NOTE — Care Management Note (Signed)
CARE MANAGEMENT NOTE 09/05/2013  Patient:  TREVEON, BOURCIER   Account Number:  [id number]  Date Initiated:  09/05/2013  Documentation initiated by:  Marthenia Rolling  Subjective/Objective Assessment:   acute hypoxic respiratory failure. copd     Action/Plan:   lives with wife. will need home02 and home health   Anticipated DC Date:  09/06/2013   Anticipated DC Plan:  [place]  In-house referral  NA      DC Planning Services  CM consult      [facility] Choice  [place]   Choice offered to / List presented to:  C-3 Spouse   DME arranged  [facility]      DME agency  [place]     HH arranged  Home RN      [place].   Status of service:  In process, will continue to follow Medicare Important Message given?   (If response is "NO", the following Medicare IM given date fields will be blank) Date Medicare IM given:   Date Additional Medicare IM given:    Discharge Disposition:  [place]  Per UR Regulation:    If discussed at Long Length of Stay Meetings, dates discussed:    Comments:  09/05/13-- 1315-- [place] [phone] Patient to have home health RN/PT services and home o2. Spoke with patient's nurse. Likely discharge tomorrow. [facility] for DME due to contract with [employer]. Spoke with Imelda Pillow-- [phone] to request 02 be delivered to patient's room prior to discharge. Necessary paperwork faxed to [facility] at [phone] with faxed confirmation of receipt received. SPoke with patient's wife to offer choice regarding home health. [facility] used in the past and wife chooses [facility] again. Call made to [facility] with [facility] to make aware of referral for PT/RN services. Elsie Ra, [place]

## 2013-09-06 DIAGNOSIS — I5022 Chronic systolic (congestive) heart failure: Secondary | ICD-10-CM

## 2013-09-06 DIAGNOSIS — B3781 Candidal esophagitis: Secondary | ICD-10-CM

## 2013-09-06 LAB — GLUCOSE, CAPILLARY
GLUCOSE-CAPILLARY: 115 mg/dL — AB (ref 70–99)
GLUCOSE-CAPILLARY: 146 mg/dL — AB (ref 70–99)
Glucose-Capillary: 93 mg/dL (ref 70–99)
Glucose-Capillary: 145 mg/dL — ABNORMAL HIGH (ref 70–99)

## 2013-09-06 LAB — CBC
HCT: 34.6 % — ABNORMAL LOW (ref 39.0–52.0)
Hemoglobin: 10 g/dL — ABNORMAL LOW (ref 13.0–17.0)
MCH: 20.3 pg — AB (ref 26.0–34.0)
MCHC: 28.9 g/dL — AB (ref 30.0–36.0)
MCV: 70.2 fL — ABNORMAL LOW (ref 78.0–100.0)
PLATELETS: 479 10*3/uL — AB (ref 150–400)
RBC: 4.93 MIL/uL (ref 4.22–5.81)
RDW: 18.7 % — ABNORMAL HIGH (ref 11.5–15.5)
WBC: 11.9 10*3/uL — ABNORMAL HIGH (ref 4.0–10.5)

## 2013-09-06 LAB — PRO B NATRIURETIC PEPTIDE: Pro B Natriuretic peptide (BNP): 1900 pg/mL — ABNORMAL HIGH (ref 0–450)

## 2013-09-06 LAB — TSH: TSH: 1.26 u[IU]/mL (ref 0.350–4.500)

## 2013-09-06 MED ORDER — PANTOPRAZOLE SODIUM 40 MG PO TBEC
40.0000 mg | DELAYED_RELEASE_TABLET | Freq: Two times a day (BID) | ORAL | Status: DC
  Administered 2013-09-06 – 2013-09-07 (×2): 40 mg via ORAL
  Filled 2013-09-06 (×2): qty 1

## 2013-09-06 MED ORDER — ASPIRIN EC 81 MG PO TBEC
81.0000 mg | DELAYED_RELEASE_TABLET | Freq: Every day | ORAL | Status: DC
  Administered 2013-09-07: 81 mg via ORAL
  Filled 2013-09-06: qty 1

## 2013-09-06 NOTE — Progress Notes (Signed)
PROGRESS NOTE  [name]:[id number] DOB: 01-29-1935 DOA: 09/01/2013 PCP: [name], MD  Summary: 79 year old man presented with progressive dyspnea on exertion for 3 weeks, asymptomatic at rest, hypoxic with ambulation his pulmonologist office. He was sent to the emergency department for further evaluation of hypoxia. Initial evaluation revealed acute respiratory failure with hypoxia, mild CHF , possible exertional angina., Positive troponin and reported EKG changes, anemia  Assessment/Plan: 1. Acute hypoxic respiratory failure. No hypoxia except with ambulation as documented. Etiology unclear, favor progression of COPD, possible pneumonia, anemia in combination. Also consider heart failure with elevated BNP but thought less likely, see discussion below. Will likely need home oxygen at time of discharge. 2. Symptomatic anemia. EGD revealed Candida esophagitis, gastric ulceration, moderate erosive esophagitis. Anemia felt to be multifactorial but esophagitis likely contributing. 3. Suspect subacute systolic congestive heart failure on admission.  Echocardiogram with significant decrease in left ventricular ejection fraction. Suspect several week history of dyspnea on exertion related to decreased heart function. Discussed in detail with the patient's cardiologist yesterday, recommended starting beta blocker, digoxin, he will evaluate today. We also discussed ventricular trigeminy, PVCs and SVT. Recommendations as above. 4. Candida esophagitis. 5. Elevated troponin on admission. Subsequent troponins negative. No evidence of ACS on admission. 6. COPD possible exacerbation. Appears resolved. 7. Possible Community acquired pneumonia. Appears resolved. Finish Levaquin. 8. Diabetes mellitus type 2. Well controlled. 9. Alzheimer's dementia. Stable, at baseline 10. Tobacco dependence. Recommend cessation.   Continue oxygen especially with ambulation.  Iron on discharge, continue nystatin  for Candida esophagitis  Further recommendations for worsened systolic heart failure per cardiology for further recommendations for trigeminy and PVCs per cardiology.  Continue empiric antibiotics.  Anticipate discharge home 6/15  Code Status: Full code DVT prophylaxis: SCDs Family Communication:  Disposition Plan: Pending  Murray Hodgkins, MD  [facility]  Pager [phone] If 7PM-7AM, please contact night-coverage at [email or web address], password [facility] 09/06/2013, 7:46 AM  LOS: 5 days   Consultants:  Cardiology  Procedures:  Transfusion one unit packed red blood cells  EGD ENDOSCOPIC IMPRESSION: 1. Candida Esophagitis  2. Moderate Erosive Esophagitis  3. Mild Antral Gastritis  4. Gastric Ulceration  5. Small Hiatal Hernia  6. Minimal Duodenitis  7. Anemia likely multifactorial but esophagitis likely contributing  to it  Antibiotics:  Levaquin 6/10 >> 6/17  HPI/Subjective: No complaints.  Objective: Filed Vitals:   09/05/13 2020 09/05/13 2047 09/05/13 2048 09/06/13 0410  BP: 104/71   132/84  Pulse: 84   88  Temp: 97.8 F (36.6 C)   98.5 F (36.9 C)  TempSrc: Oral   Oral  Resp: 19   19  Height:      Weight:    74.2 kg (163 lb 9.3 oz)  SpO2: 95% 94% 94% 100%    Intake/Output Summary (Last 24 hours) at 09/06/13 0746 Last data filed at 09/06/13 0411  Gross per 24 hour  Intake      0 ml  Output    100 ml  Net   -100 ml     Filed Weights   09/04/13 0346 09/05/13 0400 09/06/13 0410  Weight: 74.707 kg (164 lb 11.2 oz) 74.707 kg (164 lb 11.2 oz) 74.2 kg (163 lb 9.3 oz)    Exam:  Afebrile, vital signs are stable. No hypoxia on room air at rest. Gen. Appears calm and comfortable, sitting in chair. Psych. Pleasantly confused, alert, speech fluent and clear. Cardiovascular. Regular rate and rhythm. No murmur, rub or gallop. No lower extremity edema.  Telemetry with frequent PVCs, trigeminy, one episode of nonsustained ventricular tachycardia. Respiratory.  Clear to auscultation bilaterally. No wheezes, rales or rhonchi. Normal respiratory effort.  Data Reviewed: Heme: Hemoglobin stable, 10.0  Scheduled Meds: . atorvastatin  40 mg Oral q1800  . budesonide-formoterol  2 puff Inhalation BID  . digoxin  0.125 mg Oral Daily  . insulin aspart  0-9 Units Subcutaneous TID WC  . ipratropium  0.5 mg Nebulization TID  . irbesartan  300 mg Oral Daily  . levalbuterol  0.63 mg Nebulization TID  . levofloxacin  500 mg Oral Daily  . metoprolol tartrate  12.5 mg Oral BID  . nystatin  5 mL Oral QID  . pantoprazole (PROTONIX) IV  40 mg Intravenous Q12H  . predniSONE  20 mg Oral Q breakfast   Continuous Infusions:    Principal Problem:   Acute respiratory failure with hypoxia Active Problems:   COPD   Symptomatic anemia   DOE (dyspnea on exertion)   Microcytic anemia   COPD exacerbation   CAP (community acquired pneumonia)   Candida esophagitis   Chronic systolic CHF (congestive heart failure)   Time spent 20 minutes

## 2013-09-06 NOTE — Progress Notes (Addendum)
PT Note Evaluation complete.  Pt will need home O2.  Also needs prescription for pulse oximeter so wife can try and get it covered by insurance.  Full note to follow.  Thanks. [facility] [phone] (pager)

## 2013-09-06 NOTE — Progress Notes (Signed)
Subjective:  Patient states he is feeling fine. Denies dyspnea, but drop in O2 sat with ambulation noted. On tele has PVC and occasional V- Couplets  Objective:  Vital Signs in the last 24 hours: Temp:  [97.8 F (36.6 C)-98.5 F (36.9 C)] 98.5 F (36.9 C) (06/14 0410) Pulse Rate:  [80-89] 89 (06/14 0800) Resp:  [18-20] 19 (06/14 0410) BP: (104-132)/(58-84) 132/84 mmHg (06/14 0410) SpO2:  [77 %-100 %] 77 % (06/14 0910) Weight:  [74.2 kg (163 lb 9.3 oz)] 74.2 kg (163 lb 9.3 oz) (06/14 0410)  Intake/Output from previous day: 06/13 0701 - 06/14 0700 In: -  Out: 100 [Urine:100]  Physical Exam: General appearance: alert, cooperative, appears stated age and no distress  Lungs: Faint expiratory wheezes, right base slightly more prominent on the left. Emphysematous chest.  Heart: regular rate and rhythm, S1, S2 normal, no murmur, click, rub or gallop and Distant heart sounds.  Extremities: extremities normal, atraumatic, no cyanosis or edema  Pulses: Carotid pulse normal, femoral pulse normal, popliteal pulse and pedal pulse absent. Capillary fill normal.  Neurologic: Grossly normal   Lab Results: BMP  Recent Labs  04/30/13 0107 09/01/13 1834 09/05/13 0402  NA 141 142 139  K 4.5 4.1 4.1  CL 106 107 102  CO2 23 21 22   GLUCOSE 149* 121* 120*  BUN 10 18 22   CREATININE 0.98 0.95 0.88  CALCIUM 8.5 9.2 9.1  GFRNONAA 78* 79* 81*  GFRAA >90 >90 >90    CBC  Recent Labs Lab 09/02/13 0700  09/06/13 0323  WBC 9.7  < > 11.9*  RBC 4.10*  < > 4.93  HGB 8.5*  < > 10.0*  HCT 28.8*  < > 34.6*  PLT 394  < > 479*  MCV 70.2*  < > 70.2*  MCH 20.7*  < > 20.3*  MCHC 29.5*  < > 28.9*  RDW 17.9*  < > 18.7*  LYMPHSABS 1.2  --   --   MONOABS 0.9  --   --   EOSABS 0.7  --   --   BASOSABS 0.1  --   --   < > = values in this interval not displayed.  HEMOGLOBIN A1C Lab Results  Component Value Date   HGBA1C 6.4* 03/23/2013   MPG 137* 03/23/2013    Cardiac Panel (last 3  results)  Recent Labs  09/01/13 2207 09/02/13 0700 09/02/13 1327  TROPONINI <0.30 <0.30 <0.30    BNP (last 3 results)  Recent Labs  09/02/13 1327  PROBNP 3066.0*    TSH No results found for this basename: TSH,  in the last 8760 hours  CHOLESTEROL No results found for this basename: CHOL,  in the last 8760 hours  Hepatic Function Panel  Recent Labs  03/23/13 0240 04/28/13 1127 09/01/13 1834  PROT 7.5 7.0 7.1  ALBUMIN 3.8 3.1* 3.4*  AST 20 15 19   ALT 7 5 6   ALKPHOS 78 72 80  BILITOT 0.6 0.5 0.5   Scheduled Meds: . [START ON 09/07/2013] aspirin EC  81 mg Oral Daily  . atorvastatin  40 mg Oral q1800  . budesonide-formoterol  2 puff Inhalation BID  . digoxin  0.125 mg Oral Daily  . insulin aspart  0-9 Units Subcutaneous TID WC  . ipratropium  0.5 mg Nebulization TID  . irbesartan  300 mg Oral Daily  . levalbuterol  0.63 mg Nebulization TID  . levofloxacin  500 mg Oral Daily  . metoprolol tartrate  12.5 mg Oral BID  .  nystatin  5 mL Oral QID  . pantoprazole (PROTONIX) IV  40 mg Intravenous Q12H  . predniSONE  20 mg Oral Q breakfast   Continuous Infusions:  PRN Meds:.acetaminophen, levalbuterol, LORazepam, ondansetron (ZOFRAN) IV   Cardiac Studies: echocardiogram 09/05/2013: Reported EF 20-25%.  Grade 2 diastolic dysfunction.  Moderate mitral regurgitation, eccentric, inferior and inferolateral akinesis.  I have reviewed the echocardiogram, the ejection fraction may be an error, very poor echo window, contrast was not utilized.  Suspect EF to be around 35% to 40%, would recommend repeating echo with contrast, overall ejection fraction does appear to be decreased compared to prior studies.  Telemetry: Sinus rhythm, PVCs, occasional ventricular couplets.  No episodes of nonsustained VT.   Assessment/Plan:  1.  Acute systolic and Bystolic cardiac failure, ejection fraction is clearly decreased from 45% in 2013.  Inferior and inferolateral wall motion abnormality  was evident previously.  Coronary angiography about 5-6 years ago had revealed 20-30% mid LAD stenosis. 2.  Shortness of breath and dyspnea on exertion, multifactorial etiology.  Suspect underlying COPD, continued smoking, exacerbated by acute systolic and diastolic heart failure. 3.  Peripheral arterial disease, diabetes mellitus with PAD  Recommendation: I discussed with the treatment team and rounding team regarding starting low-dose of beta blocker, continuation of ARB and addition of digoxin 0.125 mg by mouth daily.  Clinically patient does not appear to be in acutely decompensated heart failure.  There is no JVD, lungs are much clearer and patient is laying flat in bed without any distress.  Although his ejection fraction may have changed, would recommend medical therapy given his multiple medical comorbidities, gastric ulcer, dementia.  I'll be happy to continue to follow him if there is any questions, he can certainly call me directly on my cell phone also.  My information is as below.  I would also recommend obtaining a TSH and repeating a BNP prior to discharge.   Laverda Page, M.D. 09/06/2013, 10:06 AM [facility] Pager: [phone] Office: [phone] If no answer: [phone]

## 2013-09-06 NOTE — Progress Notes (Signed)
Wife, [name], called and informed that safety sitter not available@[phone] , will make sure patient is in bed with bed alarm for safety, Hazle Nordmann RN

## 2013-09-06 NOTE — Evaluation (Addendum)
Physical Therapy Evaluation Patient Details Name: [name] MRN: [id number] DOB: 1935/03/14 Today's Date: 09/06/2013   History of Present Illness  Pt admit with CHF/COPD.    Clinical Impression  Pt admitted with above. Pt currently with functional limitations due to the deficits listed below (see PT Problem List). Spoke to family about need for close to 24 hour supervision. They feel he is close to baseline other than pt does need home O2 now and did not need O2 at home PTA.  Needs prescription for pulse oximeter.      Pt will benefit from skilled PT to increase their independence and safety with mobility to allow discharge to the venue listed below.     Follow Up Recommendations Home health PT;Supervision/Assistance - 24 hour    Equipment Recommendations  Other (comment) (home O2) Pulse oximeter   Recommendations for Other Services       Precautions / Restrictions Precautions Precautions: Fall Restrictions Weight Bearing Restrictions: No      Mobility  Bed Mobility Overal bed mobility: Needs Assistance Bed Mobility: Supine to Sit     Supine to sit: Min assist     General bed mobility comments: incr time and cues for technique  Transfers Overall transfer level: Needs assistance Equipment used: Rolling walker (2 wheeled) Transfers: Sit to/from Stand Sit to Stand: Min assist         General transfer comment: Pt needed steadying assist for standing.    Ambulation/Gait Ambulation/Gait assistance: Min assist Ambulation Distance (Feet): 120 Feet Assistive device: Rolling walker (2 wheeled) Gait Pattern/deviations: Step-through pattern;Decreased stride length;Drifts right/left;Wide base of support   Gait velocity interpretation: Below normal speed for age/gender General Gait Details: Pt ambulates with flexed trunk and knees.  In open spaces does well with RW with occasional cues to stay close to RW but in tight spaces, needs steadying assist with RW as he cannot  problem solve safe way to maneuver RW.  Without RW, he does ok but can tend to get overbalanced as he ambulates too fast and needs cues to slow down.    Stairs            Wheelchair Mobility    Modified Rankin (Stroke Patients Only)       Balance Overall balance assessment: Needs assistance;History of Falls Sitting-balance support: No upper extremity supported;Feet supported Sitting balance-Leahy Scale: Fair     Standing balance support: Bilateral upper extremity supported;During functional activity Standing balance-Leahy Scale: Poor Standing balance comment: Needs UE support for static and dynamic standing balance.               High level balance activites: Turns;Direction changes;Sudden stops;Backward walking High Level Balance Comments: Needs min assist for safety with high level activities.              Pertinent Vitals/Pain SATURATION QUALIFICATIONS: (This note is used to comply with regulatory documentation for home oxygen)  Patient Saturations on Room Air at Rest = 90%  Patient Saturations on Room Air while Ambulating = 80%  Patient Saturations on 2-3 Liters of oxygen while Ambulating = 90-93%  Please briefly explain why patient needs home oxygen:Pt needs O2 with activity and appears at rest as well.  Other VSS, no pain    Home Living Family/patient expects to be discharged to:: Private residence Living Arrangements: Spouse/significant other Available Help at Discharge: Family;Personal care attendant Type of Home: House Home Access: Stairs to enter Entrance Stairs-Rails: Right Entrance Stairs-Number of Steps: 4 Home Layout: One level  Home Equipment: Bedside commode;Wheelchair - [employer] - 2 wheels;Shower seat Additional Comments: WC borrowed from church    Prior Function Level of Independence: Needs Water engineer / Transfers Assistance Needed: ambulates with RW, aide supervises tub transfer  ADL's / Homemaking Assistance Needed: aide  comes in to bathe/dress patient        Hand Dominance        Extremity/Trunk Assessment   Upper Extremity Assessment: Defer to OT evaluation           Lower Extremity Assessment: Generalized weakness      Cervical / Trunk Assessment: Kyphotic  Communication   Communication: No difficulties  Cognition Arousal/Alertness: Awake/alert Behavior During Therapy: WFL for tasks assessed/performed Overall Cognitive Status: History of cognitive impairments - at baseline       Memory: Decreased short-term memory              General Comments General comments (skin integrity, edema, etc.): Educated in incentive spirometer with pt able to perform 10x an hour.      Exercises        Assessment/Plan    PT Assessment Patient needs continued PT services  PT Diagnosis Generalized weakness   PT Problem List Decreased activity tolerance;Decreased balance;Decreased mobility;Decreased knowledge of use of DME;Decreased safety awareness;Decreased knowledge of precautions;Decreased cognition  PT Treatment Interventions DME instruction;Gait training;Functional mobility training;Therapeutic activities;Therapeutic exercise;Balance training;Patient/family education   PT Goals (Current goals can be found in the Care Plan section) Acute Rehab PT Goals Patient Stated Goal: to go home PT Goal Formulation: With patient/family Time For Goal Achievement: 09/20/13 Potential to Achieve Goals: Good    Frequency Min 3X/week   Barriers to discharge        Co-evaluation               End of Session Equipment Utilized During Treatment: Gait belt;Oxygen Activity Tolerance: Patient limited by fatigue Patient left: in chair;with call bell/phone within reach;with family/visitor present;with nursing/sitter in room Nurse Communication: Mobility status         Time: 8242-3536 PT Time Calculation (min): 21 min   Charges:   PT Evaluation $Initial PT Evaluation Tier I: 1  Procedure PT Treatments $Gait Training: 8-22 mins   PT G Codes:          INGOLD,Delno Blaisdell 13-Sep-2013, 2:40 PM  [facility] [phone] (pager)

## 2013-09-06 NOTE — Progress Notes (Signed)
SATURATION QUALIFICATIONS: (This note is used to comply with regulatory documentation for home oxygen)  Patient Saturations on Room Air at Rest = 90%  Patient Saturations on Room Air while Ambulating = 80%  Patient Saturations on 2-3 Liters of oxygen while Ambulating = 90-93%  Please briefly explain why patient needs home oxygen:Pt needs O2 with activity and appears at rest as well. Thanks. [facility] [phone] (pager)

## 2013-09-06 NOTE — Progress Notes (Signed)
Patient ID: [name], male   DOB: April 08, 1934, 79 y.o.   MRN: [id number]  Sitting in chair eating lunch and reading the newspaper. No complaints. Hgb 10 (8.8).  Ok to d/c from GI standpoint. Will need 7 days of Nystatin. PPI BID for 3 months and then QD.  F/U with GI prn. Will sign off. Call if questions.

## 2013-09-07 ENCOUNTER — Encounter (HOSPITAL_COMMUNITY): Payer: Self-pay | Admitting: Gastroenterology

## 2013-09-07 ENCOUNTER — Telehealth: Payer: Self-pay | Admitting: Internal Medicine

## 2013-09-07 DIAGNOSIS — J449 Chronic obstructive pulmonary disease, unspecified: Secondary | ICD-10-CM

## 2013-09-07 DIAGNOSIS — I509 Heart failure, unspecified: Secondary | ICD-10-CM

## 2013-09-07 DIAGNOSIS — I5022 Chronic systolic (congestive) heart failure: Secondary | ICD-10-CM

## 2013-09-07 DIAGNOSIS — B3781 Candidal esophagitis: Secondary | ICD-10-CM

## 2013-09-07 LAB — GLUCOSE, CAPILLARY
Glucose-Capillary: 119 mg/dL — ABNORMAL HIGH (ref 70–99)
Glucose-Capillary: 125 mg/dL — ABNORMAL HIGH (ref 70–99)

## 2013-09-07 MED ORDER — ASPIRIN 81 MG PO TBEC: 81.0000 mg | DELAYED_RELEASE_TABLET | Freq: Every day | ORAL | Status: DC

## 2013-09-07 MED ORDER — PREDNISONE 10 MG PO TABS: ORAL_TABLET | ORAL | Status: DC

## 2013-09-07 MED ORDER — LEVOFLOXACIN 500 MG PO TABS: 500.0000 mg | ORAL_TABLET | Freq: Every day | ORAL | Status: DC

## 2013-09-07 MED ORDER — NYSTATIN 100000 UNIT/ML MT SUSP: 5.0000 mL | Freq: Four times a day (QID) | OROMUCOSAL | Status: DC

## 2013-09-07 MED ORDER — DIGOXIN 125 MCG PO TABS: 0.1250 mg | ORAL_TABLET | Freq: Every day | ORAL | Status: DC

## 2013-09-07 MED ORDER — FERROUS SULFATE 325 (65 FE) MG PO TBEC: 325.0000 mg | DELAYED_RELEASE_TABLET | Freq: Every day | ORAL | Status: DC

## 2013-09-07 MED ORDER — METOPROLOL TARTRATE 25 MG PO TABS: 12.5000 mg | ORAL_TABLET | Freq: Two times a day (BID) | ORAL | Status: DC

## 2013-09-07 MED ORDER — PANTOPRAZOLE SODIUM 40 MG PO TBEC: 40.0000 mg | DELAYED_RELEASE_TABLET | Freq: Two times a day (BID) | ORAL | Status: DC

## 2013-09-07 NOTE — Discharge Summary (Signed)
Physician Discharge Summary  [name] YQM:[id number] DOB: 1934-11-18 DOA: 09/01/2013  PCP: [name], MD Cardiologist: Dr. Einar Gip  Admit date: 09/01/2013 Discharge date: 09/07/2013  Recommendations for Outpatient Follow-up:  1. Acute hypoxic respiratory failure, multifactorial, patient newly started on home oxygen 2 L per minute continuous 2. Symptomatic microcytic anemia, status post iron infusion, consider repeat CBC in the next few weeks to assess response to oral iron therapy. 3. Evidence of worsened systolic congestive heart failure currently compensated. Currently euvolemic without diuretics. 4. Resolution of COPD exacerbation and possible community acquired pneumonia. 5. Metformin has been discontinued secondary to his heart failure, blood sugars have been well controlled during this hospitalization without significant use of insulin or other agents. For now suggest continue monitor blood sugars and hold medication.   Follow-up Information   Follow up with [facility]. (PT/RN services)    Contact information:   [address] [place] [address] [phone]       Follow up with [facility]. (home oxygen)    Contact information:   [place] [address] [phone]       Follow up with Laverda Page, MD On 09/21/2013. (@ 1115)    Specialty:  Cardiology   Contact information:   [place] [place]  [address] [phone]       Follow up with PARRETT,TAMMY, NP On 09/15/2013. (@ 2 pm)    Specialty:  Nurse Practitioner   Contact information:   [place]. [place] [address] [phone]       Follow up with [name], MD In 1 week.   Specialty:  Internal Medicine   Contact information:   [address] [place] [address] [phone]      Discharge Diagnoses:  1. Acute hypoxic respiratory failure 2. Symptomatic microcytic anemia secondary to Candida  esophagitis 3. Possible acute on chronic systolic congestive heart failure 4. Candida esophagitis 5. COPD, possible exacerbation 6. Possible [facility] pneumonia 7. Diabetes mellitus type 2 currently diet controlled  Discharge Condition: Improved Disposition: Home with home health RN, PT  Diet recommendation: Heart healthy diabetic diet  Filed Weights   09/05/13 0400 09/06/13 0410 09/07/13 0403  Weight: 74.707 kg (164 lb 11.2 oz) 74.2 kg (163 lb 9.3 oz) 72.7 kg (160 lb 4.4 oz)    History of present illness:  79 year old man presented with progressive dyspnea on exertion for 3 weeks, asymptomatic at rest, hypoxic with ambulation his pulmonologist office. He was sent to the emergency department for further evaluation of hypoxia. Initial evaluation revealed acute respiratory failure with hypoxia, mild CHF , possible exertional angina.  Hospital Course:  [name] was admitted for further evaluation of respiratory failure with hypoxia, abnormal EKG and possible COPD. He was seen in consultation with cardiology, cardiac enzymes were negative and there were no signs or symptoms to suggest acute coronary syndrome. 2-D echocardiogram did reveal worsening of chronic systolic heart failure, cardiology recommended medical management with no further intervention. His respiratory status has remained stable without evidence of volume overload without diuretic therapy. He is to have microcytic anemia and seen in consultation with gastroenterology, underwent EGD which revealed Candida esophagitis, see full report below. He is also felt to have a COPD exacerbation and possible pneumonia. With treatment of these issues his condition is improved at the time of discharge she is quite stable but will her car home oxygen. Individual issues as below. Beta blocker was recommended for his  heart disease, he appears to be tolerating this well from a pulmonary standpoint.  1. Acute hypoxic respiratory failure. No  hypoxia except with ambulation. Multifactorial, favor progression of COPD, possible pneumonia, anemia, systolic heart failure. 2. Symptomatic anemia. EGD revealed Candida esophagitis, gastric ulceration, moderate erosive esophagitis. Anemia felt to be multifactorial but esophagitis likely contributing. Stable. 3. Suspect subacute systolic congestive heart failure on admission. Echocardiogram with significant decrease in left ventricular ejection fraction. Suspect several week history of dyspnea on exertion related to decreased heart function combined with anemia and COPD. Discussed in detail with the patient's cardiologist recommended starting beta blocker, digoxin 4. Candida esophagitis. 5. Elevated troponin on admission. Subsequent troponins negative. No evidence of ACS on admission. 6. COPD possible exacerbation. Appears resolved. 7. Possible Community acquired pneumonia. Appears resolved. Finish Levaquin. 8. Diabetes mellitus type 2. Well controlled. 9. Alzheimer's dementia. Stable, at baseline 10. Tobacco dependence. Recommend cessation. Plan discharge home today with home health RN, PT.  Complete steroid taper, antibiotics Iron on discharge, will need 7 days of Nystatin. PPI BID for 3 months and then QD. Followup with GI as needed.  Consultants:  Cardiology Procedures:  Transfusion one unit packed red blood cells EGD ENDOSCOPIC IMPRESSION: 1. Candida Esophagitis  2. Moderate Erosive Esophagitis  3. Mild Antral Gastritis  4. Gastric Ulceration  5. Small Hiatal Hernia  6. Minimal Duodenitis  7. Anemia likely multifactorial but esophagitis likely contributing  to it  Antibiotics:  Levaquin 6/10 >> 6/17  Discharge Instructions  Discharge Instructions   Diet - low sodium heart healthy    Complete by:  As directed      Diet Carb Modified    Complete by:  As directed      Discharge instructions    Complete by:  As directed   Call physician or seek immediate medical attention for  increased shortness of breath, chest pain, bleeding or worsening in condition. Continue to check blood sugars every day. Metformin has been discontinued because of your heart weakness. However your blood sugars have been well controlled during her hospital stay. Call your physician for blood sugars greater than 400 or less than 70.     Increase activity slowly    Complete by:  As directed             Medication List    STOP taking these medications       clopidogrel 75 MG tablet  Commonly known as:  PLAVIX     metFORMIN 500 MG tablet  Commonly known as:  GLUCOPHAGE      TAKE these medications       albuterol (2.5 MG/3ML) 0.083% nebulizer solution  Commonly known as:  PROVENTIL  Take 2.5 mg by nebulization every 6 (six) hours as needed for wheezing or shortness of breath.     aspirin 81 MG EC tablet  Take 1 tablet (81 mg total) by mouth daily.     budesonide-formoterol 160-4.5 MCG/ACT inhaler  Commonly known as:  SYMBICORT  Inhale 2 puffs into the lungs 2 (two) times daily.     digoxin 0.125 MG tablet  Commonly known as:  LANOXIN  Take 1 tablet (0.125 mg total) by mouth daily.     ferrous sulfate 325 (65 FE) MG EC tablet  Take 1 tablet (325 mg total) by mouth daily with breakfast.     levofloxacin 500 MG tablet  Commonly known as:  LEVAQUIN  Take 1 tablet (500 mg total) by mouth daily. Start 6/16 in the morning.  metoprolol tartrate 25 MG tablet  Commonly known as:  LOPRESSOR  Take 0.5 tablets (12.5 mg total) by mouth 2 (two) times daily.     nystatin 100000 UNIT/ML suspension  Commonly known as:  MYCOSTATIN  Take 5 mLs (500,000 Units total) by mouth 4 (four) times daily.     olmesartan 40 MG tablet  Commonly known as:  BENICAR  Take 40 mg by mouth daily.     pantoprazole 40 MG tablet  Commonly known as:  PROTONIX  Take 1 tablet (40 mg total) by mouth 2 (two) times daily.     predniSONE 10 MG tablet  Commonly known as:  DELTASONE  Start 6/16 in AM. Take  20 mg by mouth daily for 3 days. Then take 10 mg by mouth daily for 3 days. Then stop.     rosuvastatin 20 MG tablet  Commonly known as:  CRESTOR  Take 20 mg by mouth at bedtime.     Vitamin D 2000 UNITS Caps  Take 1 capsule by mouth at bedtime.       No Known Allergies  The results of significant diagnostics from this hospitalization (including imaging, microbiology, ancillary and laboratory) are listed below for reference.    Significant Diagnostic Studies: Dg Chest 2 View  09/01/2013   CLINICAL DATA:  Shortness of breath, congestion, COPD  EXAM: CHEST  2 VIEW  COMPARISON:  04/28/2013  FINDINGS: Stable hyperinflation noted compatible with COPD/ emphysema. Mild apical scarring noted. Ill-defined streaky right basilar density partially obscures the hemidiaphragm suspicious for right lower lobe airspace process or pneumonia. This is posterior on the lateral view. Left lung appears clear. Normal heart size and vascularity. Atherosclerosis of the aorta. Trachea is midline. No pneumothorax.  IMPRESSION: Hyperinflation compatible with COPD/ emphysema.  Streaky ill-defined right base opacities overlying the hemidiaphragm suspicious for subtle right basilar pneumonia.   Electronically Signed   By: Daryll Brod M.D.   On: 09/01/2013 20:10   Ct Angio Chest Pe W/cm &/or Wo Cm  09/01/2013   CLINICAL DATA:  Shortness of breath. Rule out pulmonary embolism. Dementia.  EXAM: CT ANGIOGRAPHY CHEST WITH CONTRAST  TECHNIQUE: Multidetector CT imaging of the chest was performed using the standard protocol during bolus administration of intravenous contrast. Multiplanar CT image reconstructions and MIPs were obtained to evaluate the vascular anatomy.  CONTRAST:  124mL OMNIPAQUE IOHEXOL 350 MG/ML SOLN  COMPARISON:  02/21/2006  FINDINGS: THORACIC INLET/BODY WALL:  No acute abnormality.  MEDIASTINUM:  Normal heart size. No pericardial effusion. Multi focal atherosclerosis, including the coronary arteries. No acute  vascular abnormality, including pulmonary embolism or aortic dissection. Prominent mediastinal lymph nodes, likely reactive to the following.  LUNG WINDOWS:  Mild interlobular septal thickening and small layering bilateral pleural effusions. Previously noted opacity at the right base has a predominantly linear morphology, favoring atelectasis. Diffuse bronchial wall thickening which could be smoking-related or secondary to vascular congestion. There is a small area of ill-defined nodularity in the posterior segment right upper lobe.  UPPER ABDOMEN:  Possible cholelithiasis, although incomplete imaging limits certainty.  OSSEOUS:  No acute fracture.  No suspicious lytic or blastic lesions.  Review of the MIP images confirms the above findings.  IMPRESSION: 1. Negative for pulmonary embolism. 2. Mild pulmonary edema and small bilateral pleural effusion. 3. Minimal airspace disease in the right upper lobe which could be infectious or inflammatory.   Electronically Signed   By: Jorje Guild M.D.   On: 09/01/2013 21:22   Dg Chest Port 1  View  09/02/2013   CLINICAL DATA:  Dyspnea.  EXAM: PORTABLE CHEST - 1 VIEW  COMPARISON:  CT 09/01/2013 and chest radiograph 04/28/2013  FINDINGS: Single view of the chest was obtained. There are enlarged interstitial densities in the lower lungs bilaterally, right side greater the left. Heart size is normal. Atherosclerotic calcifications at the aortic arch. The trachea is midline. Blunting at the right costophrenic angle suggests a small effusion.  IMPRESSION: Enlarged interstitial lung markings, particularly in the right lower lung. Differential diagnosis would include asymmetric pulmonary edema versus atypical infection.  Small right pleural effusion.   Electronically Signed   By: Markus Daft M.D.   On: 09/02/2013 13:27     Labs: Basic Metabolic Panel:  Recent Labs Lab 09/01/13 1834 09/05/13 0402  NA 142 139  K 4.1 4.1  CL 107 102  CO2 21 22  GLUCOSE 121* 120*  BUN  18 22  CREATININE 0.95 0.88  CALCIUM 9.2 9.1  MG  --  2.1   Liver Function Tests:  Recent Labs Lab 09/01/13 1834  AST 19  ALT 6  ALKPHOS 80  BILITOT 0.5  PROT 7.1  ALBUMIN 3.4*   CBC:  Recent Labs Lab 09/01/13 1834 09/02/13 0700 09/04/13 0430 09/06/13 0323  WBC 8.5 9.7 11.7* 11.9*  NEUTROABS  --  6.8  --   --   HGB 7.7* 8.5* 8.8* 10.0*  HCT 26.9* 28.8* 30.6* 34.6*  MCV 69.2* 70.2* 69.9* 70.2*  PLT 434* 394 434* 479*   Cardiac Enzymes:  Recent Labs Lab 09/01/13 2207 09/02/13 0700 09/02/13 1327  TROPONINI <0.30 <0.30 <0.30     Recent Labs  09/02/13 1327 09/06/13 1038  PROBNP 3066.0* 1900.0*   CBG:  Recent Labs Lab 09/06/13 1116 09/06/13 1621 09/06/13 2056 09/07/13 0610 09/07/13 1117  GLUCAP 93 146* 145* 119* 125*    Principal Problem:   Acute respiratory failure with hypoxia Active Problems:   COPD   Symptomatic anemia   DOE (dyspnea on exertion)   Microcytic anemia   COPD exacerbation   CAP (community acquired pneumonia)   Candida esophagitis   Chronic systolic CHF (congestive heart failure)   Time coordinating discharge: 35 minutes  Signed:  Murray Hodgkins, MD [facility] 09/07/2013, 1:46 PM

## 2013-09-07 NOTE — Progress Notes (Signed)
Pt and wife given d/c instructions; wife verbalized understanding of d/c instructions; IV and tele monitor d/c at this time; will cont. To monitor.

## 2013-09-07 NOTE — Care Management Note (Signed)
    Page 1 of 2   09/07/2013     12:08:42 PM CARE MANAGEMENT NOTE 09/07/2013  Patient:  [name], [name]   Account Number:  [id number]  Date Initiated:  09/05/2013  Documentation initiated by:  Marthenia Rolling  Subjective/Objective Assessment:   acute hypoxic respiratory failure. copd     Action/Plan:   lives with wife. will need home02 and home health   Anticipated DC Date:  09/06/2013   Anticipated DC Plan:  [place]  In-house referral  NA      DC Planning Services  CM consult      [facility] Choice  [place]   Choice offered to / List presented to:  C-3 Spouse   DME arranged  [facility]      DME agency  [facility]     HH arranged  [place] RN      [facility].   Status of service:  Completed, signed off Medicare Important Message given?  YES (If response is "NO", the following Medicare IM given date fields will be blank) Date Medicare IM given:  09/07/2013 Date Additional Medicare IM given:    Discharge Disposition:  [place]  Per UR Regulation:  Reviewed for med. necessity/level of care/duration of stay  If discussed at [place] of Stay Meetings, dates discussed:    Comments:  09/05/13--1448-- Raylene Miyamoto [phone] Call to [facility] to confirm that orders and paperwork had been received for home o2. States they are submitting information through insurance authorization and next step will be for delivery of 02. They have case manager's number to contact if there is an issue. Patient likely for discharge tomorrow or so. [email or web address] [phone]    09/05/13-- 1315-- [place] [phone] Patient to have home health RN/PT services and home o2. Spoke with patient's nurse. Likely discharge tomorrow. [facility] for DME due to contract with [employer]. Spoke with Imelda Pillow-- [phone] to request 02 be delivered to patient's room prior to discharge. Necessary paperwork faxed to [facility]  at [phone] with faxed confirmation of receipt received. SPoke with patient's wife to offer choice regarding home health. [facility] used in the past and wife chooses [facility] again. Call made to [facility] with [facility] to make aware of referral for PT/RN services. Elsie Ra, [place]

## 2013-09-07 NOTE — Progress Notes (Signed)
PROGRESS NOTE  [name] CXK:[id number] DOB: 12/06/1934 DOA: 09/01/2013 PCP: [name], MD  Summary: 79 year old man presented with progressive dyspnea on exertion for 3 weeks, asymptomatic at rest, hypoxic with ambulation his pulmonologist office. He was sent to the emergency department for further evaluation of hypoxia. Initial evaluation revealed acute respiratory failure with hypoxia, mild CHF , possible exertional angina., Positive troponin and reported EKG changes, anemia  Assessment/Plan: 1. Acute hypoxic respiratory failure. No hypoxia except with ambulation. Multifactorial, favor progression of COPD, possible pneumonia, anemia, systolic heart failure. 2. Symptomatic anemia. EGD revealed Candida esophagitis, gastric ulceration, moderate erosive esophagitis. Anemia felt to be multifactorial but esophagitis likely contributing. Stable. 3. Suspect subacute systolic congestive heart failure on admission.  Echocardiogram with significant decrease in left ventricular ejection fraction. Suspect several week history of dyspnea on exertion related to decreased heart function combined with anemia and COPD. Discussed in detail with the patient's cardiologist recommended starting beta blocker, digoxin 4. Candida esophagitis. 5. Elevated troponin on admission. Subsequent troponins negative. No evidence of ACS on admission. 6. COPD possible exacerbation. Appears resolved. 7. Possible Community acquired pneumonia. Appears resolved. Finish Levaquin. 8. Diabetes mellitus type 2. Well controlled. 9. Alzheimer's dementia. Stable, at baseline 10. Tobacco dependence. Recommend cessation.   Plan discharge home today with home health RN, PT.  Complete steroid taper, antibiotics  Iron on discharge, will need 7 days of Nystatin. PPI BID for 3 months and then QD. Followup with GI as needed.  Medications for heart failure as above discussed in detail with wife at bedside.  Discussed with  pulmonology team Ms. Parrett to coordinate outpatient follow-up  Code Status: Full code DVT prophylaxis: SCDs Family Communication:  Disposition Plan: Pending  Murray Hodgkins, MD  [facility]  Pager [phone] If 7PM-7AM, please contact night-coverage at [email or web address], password [facility] 09/07/2013, 12:48 PM  LOS: 6 days   Consultants:  Cardiology  Procedures:  Transfusion one unit packed red blood cells  EGD ENDOSCOPIC IMPRESSION: 1. Candida Esophagitis  2. Moderate Erosive Esophagitis  3. Mild Antral Gastritis  4. Gastric Ulceration  5. Small Hiatal Hernia  6. Minimal Duodenitis  7. Anemia likely multifactorial but esophagitis likely contributing  to it  Antibiotics:  Levaquin 6/10 >> 6/17  HPI/Subjective: No complaints. Eating well.  Objective: Filed Vitals:   09/07/13 0403 09/07/13 0833 09/07/13 0835 09/07/13 1000  BP: 158/77   121/51  Pulse: 92 87  78  Temp: 98.8 F (37.1 C)     TempSrc: Oral     Resp: 18 17    Height:      Weight: 72.7 kg (160 lb 4.4 oz)     SpO2: 100% 100% 100%     Intake/Output Summary (Last 24 hours) at 09/07/13 1248 Last data filed at 09/06/13 1735  Gross per 24 hour  Intake    360 ml  Output      0 ml  Net    360 ml     Filed Weights   09/05/13 0400 09/06/13 0410 09/07/13 0403  Weight: 74.707 kg (164 lb 11.2 oz) 74.2 kg (163 lb 9.3 oz) 72.7 kg (160 lb 4.4 oz)    Exam:  Afebrile, vital signs are stable. Minimal hypoxia on room air. Gen. Appears calm, comfortable sitting in chair, doing word search puzzle. Psych. Pleasantly confused. Alert, speech and appropriate at times. Cardiovascular. Regular rate and rhythm. No murmur, rub or gallop. No lower extremity edema of note. Telemetry sinus rhythm with PVCs. Respiratory. Clear to auscultation bilaterally. No  wheezes, rales or rhonchi. Normal respiratory effort.  Data Reviewed: Capillary blood sugars stable Repeat BNP significantly improved  Scheduled Meds: . aspirin  EC  81 mg Oral Daily  . atorvastatin  40 mg Oral q1800  . budesonide-formoterol  2 puff Inhalation BID  . digoxin  0.125 mg Oral Daily  . insulin aspart  0-9 Units Subcutaneous TID WC  . ipratropium  0.5 mg Nebulization TID  . irbesartan  300 mg Oral Daily  . levalbuterol  0.63 mg Nebulization TID  . levofloxacin  500 mg Oral Daily  . metoprolol tartrate  12.5 mg Oral BID  . nystatin  5 mL Oral QID  . pantoprazole  40 mg Oral BID  . predniSONE  20 mg Oral Q breakfast   Continuous Infusions:    Principal Problem:   Acute respiratory failure with hypoxia Active Problems:   COPD   Symptomatic anemia   DOE (dyspnea on exertion)   Microcytic anemia   COPD exacerbation   CAP (community acquired pneumonia)   Candida esophagitis   Chronic systolic CHF (congestive heart failure)

## 2013-09-07 NOTE — Telephone Encounter (Signed)
Attempted to call the pts home but the line keeps disconnecting.  Will try back tomorrow.

## 2013-09-09 NOTE — Telephone Encounter (Signed)
I spoke with the pt spouse and she is asking for an order for portable oxygen to be sent to [facility]. She states the pt was discharged from recent hospital stay with oxygen. She states he attends an adult day program and will need a more portable system to take with him. Qualifying sats were obtained at visit on 09-01-13. I spoke with Dr. Melvyn Novas and he advised ok to send order.

## 2013-09-14 ENCOUNTER — Telehealth: Payer: Self-pay | Admitting: Internal Medicine

## 2013-09-14 NOTE — Telephone Encounter (Signed)
Arbie Cookey from [facility] called back and stated that they have all of the papers that they need to set the pt up they have just been behind in their orders.  She stated that they will call the pt today before 5 pm.  i have called and spoke with pts wife and she is aware that Arbie Cookey from [facility] will call today before 5.  Nothing further is needed.

## 2013-09-14 NOTE — Telephone Encounter (Signed)
Called [facility] and spoke with Yale  He will check on what it needed and call me back

## 2013-09-15 ENCOUNTER — Telehealth: Payer: Self-pay | Admitting: Adult Health

## 2013-09-15 ENCOUNTER — Inpatient Hospital Stay: Payer: Commercial Managed Care - HMO | Admitting: Adult Health

## 2013-09-15 NOTE — Telephone Encounter (Signed)
Spoke with the pt's spouse  She is concerned about bringing the pt in today due to heat and humidity It's supposed to reach 100 today  She reports that the pt is doing well and has no co's  She feels comfortable waiting for f/u until next wk I scheduled him for wed 09/23/13 with MW  I advised her to call sooner if he needs anything

## 2013-09-21 ENCOUNTER — Encounter: Payer: Self-pay | Admitting: Vascular Surgery

## 2013-09-22 ENCOUNTER — Ambulatory Visit (HOSPITAL_COMMUNITY)
Admission: RE | Admit: 2013-09-22 | Discharge: 2013-09-22 | Disposition: A | Discharge status: Home / Self Care | Payer: Medicare HMO | Source: Ambulatory Visit | Attending: Vascular Surgery | Admitting: Vascular Surgery

## 2013-09-22 ENCOUNTER — Ambulatory Visit (INDEPENDENT_AMBULATORY_CARE_PROVIDER_SITE_OTHER): Payer: Commercial Managed Care - HMO | Admitting: Vascular Surgery

## 2013-09-22 ENCOUNTER — Ambulatory Visit (INDEPENDENT_AMBULATORY_CARE_PROVIDER_SITE_OTHER)
Admission: RE | Admit: 2013-09-22 | Discharge: 2013-09-22 | Disposition: A | Discharge status: Home / Self Care | Payer: Medicare HMO | Source: Ambulatory Visit | Attending: Vascular Surgery | Admitting: Vascular Surgery

## 2013-09-22 ENCOUNTER — Encounter: Payer: Self-pay | Admitting: Vascular Surgery

## 2013-09-22 VITALS — BP 143/71 | HR 74 | Resp 16 | Ht 72.0 in | Wt 164.0 lb

## 2013-09-22 DIAGNOSIS — I739 Peripheral vascular disease, unspecified: Secondary | ICD-10-CM

## 2013-09-22 DIAGNOSIS — Z48812 Encounter for surgical aftercare following surgery on the circulatory system: Secondary | ICD-10-CM

## 2013-09-22 NOTE — Addendum Note (Signed)
Addended by: Mena Goes on: 09/22/2013 03:56 PM   Modules accepted: Orders

## 2013-09-22 NOTE — Progress Notes (Signed)
Subjective:     Patient ID: [name], male   DOB: 12/23/1934, 79 y.o.   MRN: [id number]  HPI this 79 year old male returns for continued followup regarding his right femoral-popliteal bypass graft done with Gore-Tex 04/28/2013 42 ischemic toes. He is tender well since that time. He does have now completely healed. He is ambulating on improving basis. He has no complaints regarding his left lower extremity. He has known occlusion of left femoral-popliteal graft placed in 2003. He has a functioning right to left femoral-femoral bypass.  Past Medical History  Diagnosis Date  . Hyperlipidemia   . PVD (peripheral vascular disease)   . Hypertension   . COPD (chronic obstructive pulmonary disease)   . Colon cancer   . Alzheimer's dementia 2013  . Pneumonia 2014  . Dyspnea   . Type II diabetes mellitus   . Depression     History  Substance Use Topics  . Smoking status: Former Smoker -- 0.30 packs/day for 40 years    Types: Cigarettes    Quit date: 03/26/1993  . Smokeless tobacco: Never Used  . Alcohol Use: No    Family History  Problem Relation Age of Onset  . Stroke Mother 38  . Hypertension Mother   . Cancer Sister   . Heart disease Sister   . Heart disease Brother     Heart Disease before age 77  . Heart attack Brother   . Cancer Sister   . Cancer Brother     No Known Allergies  Current outpatient prescriptions:albuterol (PROVENTIL) (2.5 MG/3ML) 0.083% nebulizer solution, Take 2.5 mg by nebulization every 6 (six) hours as needed for wheezing or shortness of breath., Disp: , Rfl: ;  aspirin EC 81 MG EC tablet, Take 1 tablet (81 mg total) by mouth daily., Disp: , Rfl: ;  budesonide-formoterol (SYMBICORT) 160-4.5 MCG/ACT inhaler, Inhale 2 puffs into the lungs 2 (two) times daily., Disp: , Rfl:  Cholecalciferol (VITAMIN D) 2000 UNITS CAPS, Take 1 capsule by mouth at bedtime., Disp: , Rfl: ;  digoxin (LANOXIN) 0.125 MG tablet, Take 1 tablet (0.125 mg total) by mouth daily.,  Disp: 30 tablet, Rfl: 0;  ferrous sulfate 325 (65 FE) MG EC tablet, Take 1 tablet (325 mg total) by mouth daily with breakfast., Disp: 30 tablet, Rfl: 0 levofloxacin (LEVAQUIN) 500 MG tablet, Take 1 tablet (500 mg total) by mouth daily. Start 6/16 in the morning., Disp: 2 tablet, Rfl: 0;  metoprolol tartrate (LOPRESSOR) 25 MG tablet, Take 0.5 tablets (12.5 mg total) by mouth 2 (two) times daily., Disp: 30 tablet, Rfl: 0;  nystatin (MYCOSTATIN) 100000 UNIT/ML suspension, Take 5 mLs (500,000 Units total) by mouth 4 (four) times daily., Disp: 180 mL, Rfl: 0 olmesartan (BENICAR) 40 MG tablet, Take 40 mg by mouth daily., Disp: , Rfl: ;  pantoprazole (PROTONIX) 40 MG tablet, Take 1 tablet (40 mg total) by mouth 2 (two) times daily., Disp: 60 tablet, Rfl: 0;  predniSONE (DELTASONE) 10 MG tablet, Start 6/16 in AM. Take 20 mg by mouth daily for 3 days. Then take 10 mg by mouth daily for 3 days. Then stop., Disp: 9 tablet, Rfl: 0 rosuvastatin (CRESTOR) 20 MG tablet, Take 20 mg by mouth at bedtime. , Disp: , Rfl:   BP 143/71  Pulse 74  Resp 16  Ht 6' (1.829 m)  Wt 164 lb (74.39 kg)  BMI 22.24 kg/m2  Body mass index is 22.24 kg/(m^2).           Review of Systems  denies chest pain but does have mild dyspnea on exertion. Patient has known dementia. As productive cough, asthma, wheezing, and diabetes mellitus. Other systems negative and complete review of systems    Objective:   Physical Exam BP 143/71  Pulse 74  Resp 16  Ht 6' (1.829 m)  Wt 164 lb (74.39 kg)  BMI 22.24 kg/m2  Gen. well-developed well-nourished elderly male no apparent stress alert and oriented x3 Lungs no rhonchi or wheezing Right leg with 3+ femoral pulse palpable. 2+ pulse in femoral-femoral bypass. Popliteal pulse is 1-2+ palpable. Right third and fifth toes have completely healed where the ischemia was located distally. Left foot is well perfused.  Today I ordered lower strandy arterial duplex and ABIs which are reviewed  and interpreted. ABI on the right is 0.69 similar to previous study of 0.70. On the left it has increased from 0.3 6.51. The right femoral-popliteal graft on the right to left femoral-femoral bypass graft are both widely patent      Assessment:     Nice healing of ischemic toes right foot-third and fifth toes following right femoral-popliteal Gore-Tex graft with functioning right to left femoral-femoral bypass    Plan:     Patient will return in 6 months for duplex scan right lower extremity bypass and ABIs be seen by nurse practitioner Continue daily aspirin

## 2013-09-23 ENCOUNTER — Encounter: Payer: Self-pay | Admitting: Internal Medicine

## 2013-09-23 ENCOUNTER — Ambulatory Visit (INDEPENDENT_AMBULATORY_CARE_PROVIDER_SITE_OTHER)
Admission: RE | Admit: 2013-09-23 | Discharge: 2013-09-23 | Disposition: A | Discharge status: Home / Self Care | Payer: Commercial Managed Care - HMO | Source: Ambulatory Visit | Attending: Internal Medicine | Admitting: Internal Medicine

## 2013-09-23 ENCOUNTER — Ambulatory Visit (INDEPENDENT_AMBULATORY_CARE_PROVIDER_SITE_OTHER): Payer: Commercial Managed Care - HMO | Admitting: Internal Medicine

## 2013-09-23 ENCOUNTER — Other Ambulatory Visit (INDEPENDENT_AMBULATORY_CARE_PROVIDER_SITE_OTHER): Payer: Commercial Managed Care - HMO

## 2013-09-23 VITALS — BP 108/60 | HR 73 | Temp 98.0°F | Ht 72.0 in | Wt 169.0 lb

## 2013-09-23 DIAGNOSIS — I5022 Chronic systolic (congestive) heart failure: Secondary | ICD-10-CM

## 2013-09-23 DIAGNOSIS — Z23 Encounter for immunization: Secondary | ICD-10-CM

## 2013-09-23 DIAGNOSIS — J449 Chronic obstructive pulmonary disease, unspecified: Secondary | ICD-10-CM

## 2013-09-23 DIAGNOSIS — I509 Heart failure, unspecified: Secondary | ICD-10-CM

## 2013-09-23 DIAGNOSIS — J9611 Chronic respiratory failure with hypoxia: Secondary | ICD-10-CM

## 2013-09-23 DIAGNOSIS — R0902 Hypoxemia: Secondary | ICD-10-CM

## 2013-09-23 DIAGNOSIS — R0602 Shortness of breath: Secondary | ICD-10-CM

## 2013-09-23 DIAGNOSIS — D509 Iron deficiency anemia, unspecified: Secondary | ICD-10-CM

## 2013-09-23 DIAGNOSIS — J9610 Chronic respiratory failure, unspecified whether with hypoxia or hypercapnia: Secondary | ICD-10-CM

## 2013-09-23 LAB — CBC WITH DIFFERENTIAL/PLATELET
Basophils Absolute: 0 10*3/uL (ref 0.0–0.1)
Basophils Relative: 0 % (ref 0.0–3.0)
Eosinophils Absolute: 0.8 10*3/uL — ABNORMAL HIGH (ref 0.0–0.7)
Eosinophils Relative: 8.5 % — ABNORMAL HIGH (ref 0.0–5.0)
HCT: 30.4 % — ABNORMAL LOW (ref 39.0–52.0)
HEMOGLOBIN: 9.5 g/dL — AB (ref 13.0–17.0)
Lymphocytes Relative: 13.9 % (ref 12.0–46.0)
Lymphs Abs: 1.3 10*3/uL (ref 0.7–4.0)
MCHC: 31.2 g/dL (ref 30.0–36.0)
MCV: 71 fl — ABNORMAL LOW (ref 78.0–100.0)
Monocytes Absolute: 0.9 10*3/uL (ref 0.1–1.0)
Monocytes Relative: 8.8 % (ref 3.0–12.0)
NEUTROS ABS: 6.7 10*3/uL (ref 1.4–7.7)
Neutrophils Relative %: 68.8 % (ref 43.0–77.0)
Platelets: 302 10*3/uL (ref 150.0–400.0)
RBC: 4.29 Mil/uL (ref 4.22–5.81)
RDW: 24.9 % — ABNORMAL HIGH (ref 11.5–15.5)
WBC: 9.7 10*3/uL (ref 4.0–10.5)

## 2013-09-23 LAB — BASIC METABOLIC PANEL
BUN: 18 mg/dL (ref 6–23)
CO2: 25 mEq/L (ref 19–32)
Calcium: 9 mg/dL (ref 8.4–10.5)
Chloride: 108 mEq/L (ref 96–112)
Creatinine, Ser: 1.1 mg/dL (ref 0.4–1.5)
GFR: 80.96 mL/min (ref >60.00)
Glucose, Bld: 112 mg/dL — ABNORMAL HIGH (ref 70–99)
Potassium: 4.1 mEq/L (ref 3.5–5.1)
Sodium: 140 mEq/L (ref 135–145)

## 2013-09-23 LAB — SEDIMENTATION RATE: Sed Rate: 25 mm/hr — ABNORMAL HIGH (ref 0–22)

## 2013-09-23 LAB — BRAIN NATRIURETIC PEPTIDE: Pro B Natriuretic peptide (BNP): 673 pg/mL — ABNORMAL HIGH (ref 0.0–100.0)

## 2013-09-23 NOTE — Progress Notes (Signed)
Quick Note:  Spoke with the pt's spouse and notified of results and she verbalize ______

## 2013-09-23 NOTE — Patient Instructions (Addendum)
Work on inhaler technique:  relax and gently blow all the way out then take a nice smooth deep breath back in, triggering the inhaler at same time you start breathing in.  Hold for up to 5 seconds if you can.  Rinse and gargle with water when done   Only use your albuterol ne as a rescue medication to be used if you can't catch your breath by resting or doing a relaxed purse lip breathing pattern.  - The less you use it, the better it will work when you need it. - Ok to use up to 2  every 4 hours if you must but call for immediate appointment if use goes up over your usual need  Please remember to go to the lab and x-ray department downstairs for your tests - we will call you with the results when they are available.     Please schedule a follow up office visit in 4 weeks, sooner if needed

## 2013-09-23 NOTE — Progress Notes (Signed)
Subjective:     Patient ID: [name], male   DOB: 1935/01/20  MRN: [id number]     Brief patient profile:  79  yobm s/p smoking cessation in 1995 previously felt to have COPD  GOLD II criteria with reversibility 01/06/09    HPI January 06, 2009 Followup with PFT's. Pt states that overall breathing has been okay. He gets SOB when he gets in a rush. [name] states that pt gets out of breath just walking from one room to the next. Also he c/o dry cough- especially at night x 4-6 weeks eval by [facility] with cxr and rx cough syrup > no better. no purulent sputum. Try higher strength of Symbicort 160 2 puffs first thing in am and 2 puffs again in pm about 12 hours later and if happy with it fill the prescription.  as long as you are coughing take the zegerid otc at bedtime > seemed to help to pt and wife's satisfaction.    05/04/2011 ov/ Wert cc sob much on symbicort to point where don't need the neb at all, nothing limited from doing by sob and no cough rec f/u prn   . 06/16/2013 f/u ov/Wert re: GOLD II  COPD on symbicort 160 2bid  Chief Complaint  Patient presents with  . Pulmonary Consult    Pt states here to f/u since he has new insurance. He reports breahting is doing well and no new co's today.    Walking around the house and ok at [place] s walker/ 02 Never needs rescue but has neb "in case"  Very rarely uses it. >no changes  rec Continue symbicort Take 2 puffs first thing in am and then another 2 puffs about 12 hours later.  Only use your albuterol if needed > avg use was every 6 months, increased to 4 h p mem day weekend      09/01/2013 Acute OV  Complains of increased SOB especially w/ exertion, wheezing, some dry cough x2-3weeks. No fever or discolored mucus  Occurred while at [facility]. No leg swelling, calf pain or edema.  At rest no significant dyspnea. But cant walk 10 feet without severe dyspnea, cough and wheezing.  Today in office with desats in 70% with  ambulation.  EKG shows ST depression .  O2 sats at rest 100% on RA  No chest pain, n/v/d, hemoptysis , fever.  Denies f/c/s, hemoptysis, nausea, vomiting. No calf pain rec admit  Admit date: 09/01/2013  Discharge date: 09/07/2013  Recommendations for Outpatient Follow-up:  1. Acute hypoxic respiratory failure, multifactorial, patient newly started on home oxygen 2 L per minute continuous 2. Symptomatic microcytic anemia, status post iron infusion, consider repeat CBC in the next few weeks to assess response to oral iron therapy. 3. Evidence of worsened systolic congestive heart failure currently compensated. Currently euvolemic without diuretics. 4. Resolution of COPD exacerbation and possible community acquired pneumonia. 5. Metformin has been discontinued secondary to his heart failure, blood sugars have been well controlled during this hospitalization without significant use of insulin or other agents. For now suggest continue monitor blood sugars and hold medication  6.  Discharge Diagnoses:  1. Acute hypoxic respiratory failure 2. Symptomatic microcytic anemia secondary to Candida esophagitis 3. Possible acute on chronic systolic congestive heart failure 4. Candida esophagitis 5. COPD, possible exacerbation 6. Possible [facility] pneumonia 7. Diabetes mellitus type 2 currently diet controlled Discharge Condition: Improved  Disposition: Home with home health RN, PT  Diet recommendation: Heart healthy  diabetic diet  Filed Weights    09/05/13 0400  09/06/13 0410  09/07/13 0403   Weight:  74.707 kg (164 lb 11.2 oz)  74.2 kg (163 lb 9.3 oz)  72.7 kg (160 lb 4.4 oz)   History of present illness:  79 year old man presented with progressive dyspnea on exertion for 3 weeks, asymptomatic at rest, hypoxic with ambulation his pulmonologist office. He was sent to the emergency department for further evaluation of hypoxia. Initial evaluation revealed acute respiratory failure with hypoxia, mild  CHF , possible exertional angina.  Hospital Course:  [name] was admitted for further evaluation of respiratory failure with hypoxia, abnormal EKG and possible COPD. He was seen in consultation with cardiology, cardiac enzymes were negative and there were no signs or symptoms to suggest acute coronary syndrome. 2-D echocardiogram did reveal worsening of chronic systolic heart failure, cardiology recommended medical management with no further intervention. His respiratory status has remained stable without evidence of volume overload without diuretic therapy. He is to have microcytic anemia and seen in consultation with gastroenterology, underwent EGD which revealed Candida esophagitis, see full report below. He is also felt to have a COPD exacerbation and possible pneumonia. With treatment of these issues his condition is improved at the time of discharge she is quite stable but will her car home oxygen. Individual issues as below. Beta blocker was recommended for his heart disease, he appears to be tolerating this well from a pulmonary standpoint.  1. Acute hypoxic respiratory failure. No hypoxia except with ambulation. Multifactorial, favor progression of COPD, possible pneumonia, anemia, systolic heart failure. 2. Symptomatic anemia. EGD revealed Candida esophagitis, gastric ulceration, moderate erosive esophagitis. Anemia felt to be multifactorial but esophagitis likely contributing. Stable. 3. Suspect subacute systolic congestive heart failure on admission. Echocardiogram with significant decrease in left ventricular ejection fraction. Suspect several week history of dyspnea on exertion related to decreased heart function combined with anemia and COPD. Discussed in detail with the patient's cardiologist recommended starting beta blocker, digoxin 4. Candida esophagitis. 5. Elevated troponin on admission. Subsequent troponins negative. No evidence of ACS on admission. 6. COPD possible exacerbation.  Appears resolved. 7. Possible Community acquired pneumonia. Appears resolved. Finish Levaquin. 8. Diabetes mellitus type 2. Well controlled. 9. Alzheimer's dementia. Stable, at baseline 10. Tobacco dependence. Recommend cessation. Plan discharge home today with home health RN, PT.  Complete steroid taper, antibiotics  Iron on discharge, will need 7 days of Nystatin. PPI BID for 3 months and then QD. Followup with GI as needed. Consultants:  Cardiology Procedures:  Transfusion one unit packed red blood cells  EGD ENDOSCOPIC IMPRESSION: 1. Candida Esophagitis  2. Moderate Erosive Esophagitis  3. Mild Antral Gastritis  4. Gastric Ulceration  5. Small Hiatal Hernia  6. Minimal Duodenitis  7. Anemia likely multifactorial but esophagitis likely contributing  to it  Antibiotics:  Levaquin 6/10 >> 6/17 Discharge Instructions  Discharge Instructions    Diet - low sodium heart healthy  Complete by: As directed       Diet Carb Modified  Complete by: As directed       Discharge instructions  Complete by: As directed    Call physician or seek immediate medical attention for increased shortness of breath, chest pain, bleeding or worsening in condition. Continue to check blood sugars every day. Metformin has been discontinued because of your heart weakness. However your blood sugars have been well controlled during her hospital stay. Call your physician for blood sugars greater than 400 or less than  70.     Increase activity slowly  Complete by: As directed              Medication List     STOP taking these medications       clopidogrel 75 MG tablet    Commonly known as: PLAVIX    metFORMIN 500 MG tablet    Commonly known as: GLUCOPHAGE     TAKE these medications       albuterol (2.5 MG/3ML) 0.083% nebulizer solution    Commonly known as: PROVENTIL    Take 2.5 mg by nebulization every 6 (six) hours as needed for wheezing or shortness of breath.    aspirin 81 MG EC tablet    Take 1  tablet (81 mg total) by mouth daily.    budesonide-formoterol 160-4.5 MCG/ACT inhaler    Commonly known as: SYMBICORT    Inhale 2 puffs into the lungs 2 (two) times daily.    digoxin 0.125 MG tablet    Commonly known as: LANOXIN    Take 1 tablet (0.125 mg total) by mouth daily.    ferrous sulfate 325 (65 FE) MG EC tablet    Take 1 tablet (325 mg total) by mouth daily with breakfast.    levofloxacin 500 MG tablet    Commonly known as: LEVAQUIN    Take 1 tablet (500 mg total) by mouth daily. Start 6/16 in the morning.    metoprolol tartrate 25 MG tablet    Commonly known as: LOPRESSOR    Take 0.5 tablets (12.5 mg total) by mouth 2 (two) times daily.    nystatin 100000 UNIT/ML suspension    Commonly known as: MYCOSTATIN    Take 5 mLs (500,000 Units total) by mouth 4 (four) times daily.    olmesartan 40 MG tablet    Commonly known as: BENICAR    Take 40 mg by mouth daily.    pantoprazole 40 MG tablet    Commonly known as: PROTONIX    Take 1 tablet (40 mg total) by mouth 2 (two) times daily.    predniSONE 10 MG tablet    Commonly known as: DELTASONE    Start 6/16 in AM. Take 20 mg by mouth daily for 3 days. Then take 10 mg by mouth daily for 3 days. Then stop.    rosuvastatin 20 MG tablet    Commonly known as: CRESTOR    Take 20 mg by mouth at bedtime.    Vitamin D 2000 UNITS Caps    Take 1 capsule by mouth at bedtime.      09/23/2013 post f/u ov/extensive ov at transition of care/Wert re: 02 24/7 at 2lpm Chief Complaint  Patient presents with  . HFU    Pt states breathing is some better, but not back to baseline. He c/o increased cough that started before he was d/c'ed- cough is non prod.   no worse since finished prednisone  Now on 02 continuously  @ 2lpm  Cough is mostly daytime, not assoc with meals Confused with meds  No obvious day to day or daytime variabilty or assoc  cp or chest tightness, subjective wheeze overt sinus or hb symptoms. No unusual exp hx or h/o childhood  pna/ asthma or knowledge of premature birth.  Sleeping ok without nocturnal  or early am exacerbation  of respiratory  c/o's or need for noct saba. Also denies any obvious fluctuation of symptoms with weather or environmental changes or other aggravating or alleviating factors except as outlined  above   Current Medications, Allergies, Complete Past Medical History, Past Surgical History, Family History, and Social History were reviewed in [employer] record.  ROS  The following are not active complaints unless bolded sore throat, dysphagia, dental problems, itching, sneezing,  nasal congestion or excess/ purulent secretions, ear ache,   fever, chills, sweats, unintended wt loss, pleuritic or exertional cp, hemoptysis,  orthopnea pnd or leg swelling, presyncope, palpitations, heartburn, abdominal pain, anorexia, nausea, vomiting, diarrhea  or change in bowel or urinary habits, change in stools or urine, dysuria,hematuria,  rash, arthralgias, visual complaints, headache, numbness weakness or ataxia or problems with walking or coordination,  change in mood/affect or memory.                  Past Medical History:  HYPERLIPIDEMIA (ICD-272.4)  PVD (ICD-443.9)  HYPERTENSION NEC  DYSPNEA (ICD-786.05)  COPD  PFT's 11/18/00 FEV1 is 1.94 or 60%  - PFT's 01/06/09 FEV1 1.75 (57%) ratio 49 and 21 % better FVC after B2 DLC0 71  - HFA 90% better p coaching 01/23/10             Objective:   Physical Exam   elderly black male ambulatory mildly hoarse NAD struggling with questions re specific symptom issues Wt 180 > 187 January 06, 2009 > 186 01/23/10 > 05/04/2011  191 > 06/16/2013  170 >169 09/01/2013 >   169 09/23/2013   HEENT mild turbinate edema. Edentulous,  Oropharynx no thrush or excess pnd or cobblestoning. No JVD or cervical adenopathy. Mild accessory muscle hypertrophy. Trachea midline, nl thryroid. Chest was hyperinflated by percussion with diminished breath sounds and  moderate increased exp time with trace bilateral mid expiratory wheeze. Hoover sign positive at mid inspiration. Regular rate and rhythm without murmur gallop or rub or increase P2. No edema , neg homans sign Abd: no hsm, nl excursion. Ext warm without cyanosis or clubbing.    CXR  09/23/2013 :  No active cardiopulmonary disease.  Marland Kitchen  Recent Labs Lab 09/23/13 1522  NA 140  K 4.1  CL 108  CO2 25  BUN 18  CREATININE 1.1  GLUCOSE 112*    Recent Labs Lab 09/23/13 1522  HGB 9.5*  HCT 30.4*  WBC 9.7  PLT 302.0      Lab Results  Component Value Date   PROBNP 673.0* 09/23/2013       Assessment:

## 2013-09-24 NOTE — Progress Notes (Signed)
Quick Note:  LMTCB ______ 

## 2013-09-24 NOTE — Progress Notes (Signed)
Quick Note:  Spoke with pt and notified of results per Dr. Wert. Pt verbalized understanding and denied any questions.  ______ 

## 2013-09-25 NOTE — Assessment & Plan Note (Addendum)
bnp trending down nicely vs prev levels (1900 2 weeks ago) , no change rx needed

## 2013-09-25 NOTE — Assessment & Plan Note (Signed)
See GI w/u as inpt   Recent Labs Lab 09/23/13 1522  HGB 9.5*    Still microcytic on fe but reasonably stable s overt GIB > no change rx

## 2013-09-25 NOTE — Assessment & Plan Note (Signed)
New onset Hypoxia 09/01/2013 >.admit and d/c on 2lpm  Adequate control on present rx, reviewed > no change in rx needed

## 2013-09-25 NOTE — Assessment & Plan Note (Addendum)
PFT's 11/18/00 FEV1 is 1.94 or 60%  - PFT's 01/06/09 FEV1 1.75 (57%) ratio 49 and 21 % better FVC after B2 DLC0 71  - Prevnar given 09/23/13   DDX of  difficult airways management all start with A and  include Adherence, Ace Inhibitors, Acid Reflux, Active Sinus Disease, Alpha 1 Antitripsin deficiency, Anxiety masquerading as Airways dz,  ABPA,  allergy(esp in young), Aspiration (esp in elderly), Adverse effects of DPI,  Active smokers, plus two Bs  = Bronchiectasis and Beta blocker use..and one C= CHF  Adherence is always the initial "prime suspect" and is a multilayered concern that requires a "trust but verify" approach in every patient - starting with knowing how to use medications, especially inhalers, correctly, keeping up with refills and understanding the fundamental difference between maintenance and prns vs those medications only taken for a very short course and then stopped and not refilled.  - cognitive impairment challenging and wife did not activate action plan that might have prevented this last admit (she was supposed to call if saba use increased, which happened for sev weeks pta - The proper method of use, as well as anticipated side effects, of a metered-dose inhaler are discussed and demonstrated to the patient. Improved effectiveness after extensive coaching during this visit to a level of approximately  75%  ? Acid (or non-acid) GERD > always difficult to exclude as up to 75% of pts in some series report no assoc GI/ Heartburn symptoms> rec continue max (24h)  acid suppression     Extensive discussion wit pt and wife lasting 6 m of 58 m visit:   Each maintenance medication was reviewed in detail including most importantly the difference between maintenance and as needed and under what circumstances the prns are to be used.  Please see instructions for details which were reviewed in writing and the patient given a copy.

## 2013-09-29 ENCOUNTER — Ambulatory Visit: Payer: Commercial Managed Care - HMO | Admitting: Internal Medicine

## 2013-10-27 ENCOUNTER — Encounter (HOSPITAL_COMMUNITY): Payer: Self-pay | Admitting: Emergency Medicine

## 2013-10-27 ENCOUNTER — Emergency Department (HOSPITAL_COMMUNITY)
Admission: EM | Admit: 2013-10-27 | Discharge: 2013-10-28 | Disposition: A | Discharge status: Home / Self Care | Payer: Medicare HMO | Attending: Emergency Medicine | Admitting: Emergency Medicine

## 2013-10-27 ENCOUNTER — Emergency Department (HOSPITAL_COMMUNITY): Payer: Medicare HMO

## 2013-10-27 DIAGNOSIS — E119 Type 2 diabetes mellitus without complications: Secondary | ICD-10-CM | POA: Insufficient documentation

## 2013-10-27 DIAGNOSIS — Z85038 Personal history of other malignant neoplasm of large intestine: Secondary | ICD-10-CM | POA: Insufficient documentation

## 2013-10-27 DIAGNOSIS — J449 Chronic obstructive pulmonary disease, unspecified: Secondary | ICD-10-CM | POA: Insufficient documentation

## 2013-10-27 DIAGNOSIS — J4489 Other specified chronic obstructive pulmonary disease: Secondary | ICD-10-CM | POA: Insufficient documentation

## 2013-10-27 DIAGNOSIS — Z87891 Personal history of nicotine dependence: Secondary | ICD-10-CM | POA: Diagnosis not present

## 2013-10-27 DIAGNOSIS — Z792 Long term (current) use of antibiotics: Secondary | ICD-10-CM | POA: Insufficient documentation

## 2013-10-27 DIAGNOSIS — F0280 Dementia in other diseases classified elsewhere without behavioral disturbance: Secondary | ICD-10-CM | POA: Diagnosis not present

## 2013-10-27 DIAGNOSIS — R339 Retention of urine, unspecified: Secondary | ICD-10-CM | POA: Diagnosis present

## 2013-10-27 DIAGNOSIS — IMO0002 Reserved for concepts with insufficient information to code with codable children: Secondary | ICD-10-CM | POA: Diagnosis not present

## 2013-10-27 DIAGNOSIS — Z79899 Other long term (current) drug therapy: Secondary | ICD-10-CM | POA: Diagnosis not present

## 2013-10-27 DIAGNOSIS — Z7982 Long term (current) use of aspirin: Secondary | ICD-10-CM | POA: Diagnosis not present

## 2013-10-27 DIAGNOSIS — G309 Alzheimer's disease, unspecified: Secondary | ICD-10-CM | POA: Insufficient documentation

## 2013-10-27 DIAGNOSIS — Z8659 Personal history of other mental and behavioral disorders: Secondary | ICD-10-CM | POA: Insufficient documentation

## 2013-10-27 DIAGNOSIS — I10 Essential (primary) hypertension: Secondary | ICD-10-CM | POA: Insufficient documentation

## 2013-10-27 DIAGNOSIS — Z9889 Other specified postprocedural states: Secondary | ICD-10-CM | POA: Insufficient documentation

## 2013-10-27 DIAGNOSIS — Z8701 Personal history of pneumonia (recurrent): Secondary | ICD-10-CM | POA: Insufficient documentation

## 2013-10-27 DIAGNOSIS — I509 Heart failure, unspecified: Secondary | ICD-10-CM | POA: Insufficient documentation

## 2013-10-27 LAB — CBC WITH DIFFERENTIAL/PLATELET
Basophils Absolute: 0.1 10*3/uL (ref 0.0–0.1)
Basophils Relative: 1 % (ref 0–1)
EOS PCT: 9 % — AB (ref 0–5)
Eosinophils Absolute: 0.7 10*3/uL (ref 0.0–0.7)
HCT: 35.6 % — ABNORMAL LOW (ref 39.0–52.0)
Hemoglobin: 10.4 g/dL — ABNORMAL LOW (ref 13.0–17.0)
LYMPHS PCT: 21 % (ref 12–46)
Lymphs Abs: 1.7 10*3/uL (ref 0.7–4.0)
MCH: 23.1 pg — ABNORMAL LOW (ref 26.0–34.0)
MCHC: 29.2 g/dL — ABNORMAL LOW (ref 30.0–36.0)
MCV: 78.9 fL (ref 78.0–100.0)
MONOS PCT: 10 % (ref 3–12)
Monocytes Absolute: 0.8 10*3/uL (ref 0.1–1.0)
Neutro Abs: 5 10*3/uL (ref 1.7–7.7)
Neutrophils Relative %: 59 % (ref 43–77)
PLATELETS: 423 10*3/uL — AB (ref 150–400)
RBC: 4.51 MIL/uL (ref 4.22–5.81)
RDW: 26.7 % — ABNORMAL HIGH (ref 11.5–15.5)
WBC: 8.3 10*3/uL (ref 4.0–10.5)

## 2013-10-27 LAB — BASIC METABOLIC PANEL
ANION GAP: 13 (ref 5–15)
BUN: 18 mg/dL (ref 6–23)
CO2: 22 mEq/L (ref 19–32)
Calcium: 9.3 mg/dL (ref 8.4–10.5)
Chloride: 102 mEq/L (ref 96–112)
Creatinine, Ser: 0.91 mg/dL (ref 0.50–1.35)
GFR calc Af Amer: >90 mL/min (ref >90)
GFR, EST NON AFRICAN AMERICAN: 80 mL/min — AB (ref >90)
GLUCOSE: 106 mg/dL — AB (ref 70–99)
POTASSIUM: 4.2 meq/L (ref 3.7–5.3)
SODIUM: 137 meq/L (ref 137–147)

## 2013-10-27 LAB — PRO B NATRIURETIC PEPTIDE: Pro B Natriuretic peptide (BNP): 3057 pg/mL — ABNORMAL HIGH (ref 0–450)

## 2013-10-27 NOTE — ED Notes (Signed)
Patient transported to X-ray 

## 2013-10-27 NOTE — ED Notes (Signed)
Pt states he cannot urinate since Sunday, pt will have urge to go but nothing will come out. Pt has CHF and dementia.pt denies pain.

## 2013-10-27 NOTE — ED Notes (Signed)
Pt given urinal to attempt to get urine sample

## 2013-10-28 LAB — URINALYSIS, ROUTINE W REFLEX MICROSCOPIC
Bilirubin Urine: NEGATIVE
Glucose, UA: NEGATIVE mg/dL
Ketones, ur: NEGATIVE mg/dL
Nitrite: NEGATIVE
Protein, ur: 30 mg/dL — AB
SPECIFIC GRAVITY, URINE: 1.026 (ref 1.005–1.030)
UROBILINOGEN UA: 1 mg/dL (ref 0.0–1.0)
pH: 5.5 (ref 5.0–8.0)

## 2013-10-28 LAB — URINE MICROSCOPIC-ADD ON

## 2013-10-28 MED ORDER — FUROSEMIDE 20 MG PO TABS: 20.0000 mg | ORAL_TABLET | Freq: Every day | ORAL | Status: DC

## 2013-10-28 NOTE — Discharge Instructions (Signed)
Take lasix every morninig for next 5 days.  Re check with your Primary Physician within the next week.  Return to ER with any additional difficulties.

## 2013-10-28 NOTE — ED Provider Notes (Signed)
CSN: [id number]     Arrival date & time 10/27/13  1855 History   First MD Initiated Contact with Patient 10/27/13 2154     Chief Complaint  Patient presents with  . Congestive Heart Failure  . Urinary Retention     HPI  Shortness of his wife. Wife is concerned because he is not "urinated much" for last 2 days. She was concerned that his "bladder was obstructed". No history of urinary retention. Had an episode of dyspnea when he was admitted in June is multifactorial. Is not on Lasix chronically. No chest pain. No other symptoms lately.  Past Medical History  Diagnosis Date  . Hyperlipidemia   . PVD (peripheral vascular disease)   . Hypertension   . COPD (chronic obstructive pulmonary disease)   . Colon cancer   . Alzheimer's dementia 2013  . Pneumonia 2014  . Dyspnea   . Type II diabetes mellitus   . Depression    Past Surgical History  Procedure Laterality Date  . Colon surgery  1996    cancer  . Lumbar disc surgery  2005  . Vascular surgery    . Colon resection  1997    /enc. notes 05/17/2004 (04/28/2013)  . Femoral artery - femoral artery bypass graft      right to left/enc. notes 05/17/2004 (04/28/2013)  . Femoral bypass Right     /enc. notes 05/17/2004 (04/28/2013)  . Fiberoptic bronchoscopy      /enc. notes 03/16/2005 (04/28/2013)  . Cataract extraction w/ intraocular lens implant Left 01/2013  . Cataract extraction Right 02/2013  . Femoral-popliteal bypass graft Right 04/29/2013    Procedure:  FEMORAL-POPLITEAL ARTERY Bypass Graft with intraoperative ultrasound and arteriogarm;  Surgeon: Mal Misty, MD;  Location: [place];  Service: Vascular;  Laterality: Right;  . Intraoperative arteriogram Right 04/29/2013    Procedure: INTRA OPERATIVE ARTERIOGRAM;  Surgeon: Mal Misty, MD;  Location: [place];  Service: Vascular;  Laterality: Right;  . Spine surgery    . Esophagogastroduodenoscopy N/A 09/05/2013    Procedure: ESOPHAGOGASTRODUODENOSCOPY (EGD);  Surgeon: Lear Ng,  MD;  Location: [facility] ENDOSCOPY;  Service: Endoscopy;  Laterality: N/A;   Family History  Problem Relation Age of Onset  . Stroke Mother 51  . Hypertension Mother   . Cancer Sister   . Heart disease Sister   . Heart disease Brother     Heart Disease before age 46  . Heart attack Brother   . Cancer Sister   . Cancer Brother    History  Substance Use Topics  . Smoking status: Former Smoker -- 0.30 packs/day for 40 years    Types: Cigarettes    Quit date: 03/26/1993  . Smokeless tobacco: Never Used  . Alcohol Use: No    Review of Systems  Constitutional: Negative for fever, chills, diaphoresis, appetite change and fatigue.  HENT: Negative for mouth sores, sore throat and trouble swallowing.   Eyes: Negative for visual disturbance.  Respiratory: Negative for cough, chest tightness, shortness of breath and wheezing.   Cardiovascular: Negative for chest pain.  Gastrointestinal: Negative for nausea, vomiting, abdominal pain, diarrhea and abdominal distention.  Endocrine: Negative for polydipsia, polyphagia and polyuria.  Genitourinary: Negative for dysuria, frequency, hematuria and decreased urine volume.  Musculoskeletal: Negative for gait problem.  Skin: Negative for color change, pallor and rash.  Neurological: Negative for dizziness, syncope, light-headedness and headaches.  Hematological: Does not bruise/bleed easily.  Psychiatric/Behavioral: Negative for behavioral problems and confusion.  Allergies  Review of patient's allergies indicates no known allergies.  Home Medications   Prior to Admission medications   Medication Sig Start Date End Date Taking? Authorizing Provider  albuterol (PROVENTIL) (2.5 MG/3ML) 0.083% nebulizer solution Take 2.5 mg by nebulization every 6 (six) hours as needed for wheezing or shortness of breath.   Yes Historical Provider, MD  aspirin EC 81 MG EC tablet Take 1 tablet (81 mg total) by mouth daily. 09/07/13  Yes Samuella Cota, MD   budesonide-formoterol [facility]) 160-4.5 MCG/ACT inhaler Inhale 2 puffs into the lungs 2 (two) times daily.   Yes Historical Provider, MD  Cholecalciferol (VITAMIN D) 2000 UNITS CAPS Take 1 capsule by mouth at bedtime.   Yes Historical Provider, MD  ciprofloxacin (CIPRO) 500 MG tablet Take 1 tablet by mouth 2 (two) times daily. For 14 days 10/24/13  Yes Historical Provider, MD  ferrous sulfate 325 (65 FE) MG EC tablet Take 1 tablet (325 mg total) by mouth daily with breakfast. 09/07/13  Yes Samuella Cota, MD  metFORMIN (GLUCOPHAGE) 500 MG tablet Take 500 mg by mouth daily.   Yes Historical Provider, MD  metoprolol tartrate (LOPRESSOR) 25 MG tablet Take 0.5 tablets (12.5 mg total) by mouth 2 (two) times daily. 09/07/13  Yes Samuella Cota, MD  Multiple Vitamin (MULTIVITAMIN) capsule Take 1 capsule by mouth daily.   Yes Historical Provider, MD  nystatin (MYCOSTATIN) 100000 UNIT/ML suspension Take 5 mLs (500,000 Units total) by mouth 4 (four) times daily. 09/07/13  Yes Samuella Cota, MD  olmesartan (BENICAR) 40 MG tablet Take 40 mg by mouth daily.   Yes Historical Provider, MD  rosuvastatin (CRESTOR) 20 MG tablet Take 20 mg by mouth at bedtime.    Yes Historical Provider, MD  furosemide (LASIX) 20 MG tablet Take 1 tablet (20 mg total) by mouth daily. 10/27/13   Tanna Furry, MD   BP 156/97  Pulse 93  Temp(Src) 97.4 F (36.3 C) (Oral)  Resp 18  SpO2 100% Physical Exam  Constitutional: He appears well-developed and well-nourished. No distress.  Patient with dementia. Very pleasant. Calm.  HENT:  Head: Normocephalic.  Eyes: Conjunctivae are normal. Pupils are equal, round, and reactive to light. No scleral icterus.  Neck: Normal range of motion. Neck supple. No thyromegaly present.  Cardiovascular: Normal rate and regular rhythm.  Exam reveals no gallop and no friction rub.   No murmur heard. Pulmonary/Chest: Effort normal and breath sounds normal. No respiratory distress. He has no  wheezes. He has no rales.  Abdominal: Soft. Bowel sounds are normal. He exhibits no distension. There is no tenderness. There is no rebound.  Musculoskeletal: Normal range of motion.  Neurological: He is alert.  Skin: Skin is warm and dry. No rash noted.  Psychiatric: He has a normal mood and affect. His behavior is normal.    ED Course  Procedures (including critical care time) Labs Review Labs Reviewed  BASIC METABOLIC PANEL - Abnormal; Notable for the following:    Glucose, Bld 106 (*)    GFR calc non Af Amer 80 (*)    All other components within normal limits  CBC WITH DIFFERENTIAL - Abnormal; Notable for the following:    Hemoglobin 10.4 (*)    HCT 35.6 (*)    MCH 23.1 (*)    MCHC 29.2 (*)    RDW 26.7 (*)    Platelets 423 (*)    Eosinophils Relative 9 (*)    All other components within normal limits  PRO B NATRIURETIC PEPTIDE - Abnormal; Notable for the following:    Pro B Natriuretic peptide (BNP) 3057.0 (*)    All other components within normal limits  URINE CULTURE  URINALYSIS, ROUTINE W REFLEX MICROSCOPIC    Imaging Review Dg Chest 2 View  10/27/2013   CLINICAL DATA:  CONGESTIVE HEART FAILURE URINARY RETENTION  EXAM: CHEST  2 VIEW  COMPARISON:  Prior radiograph from 09/23/2013  FINDINGS: The cardiac and mediastinal silhouettes are stable in size and contour, and remain within normal limits.  The lungs are normally inflated. Irregular bibasilar opacities are favored to reflect mild basilar predominant interstitial edema and/ or atelectasis or scarring. No focal infiltrate. Small right pleural effusion present. No pulmonary edema or pneumothorax.  No acute osseous abnormality.  IMPRESSION: 1. Small right pleural effusion. 2. Irregular bibasilar opacities, favored to reflect mild basilar predominant interstitial vascular congestion and/or atelectasis. No overt pulmonary edema or definite focal infiltrate.   Electronically Signed   By: Jeannine Boga M.D.   On: 10/27/2013  23:53     EKG Interpretation None      MDM   Final diagnoses:  Congestive heart failure, unspecified congestive heart failure chronicity, unspecified congestive heart failure type    Pt without Urinary retention.  No post void residual. Mild elevation of BNP. No obvious CHF on exam. No crackles. No hypoxemia. No dyspnea. No JVD. No gallop. Plan a.m. dose Lasix next 5 days. Primary care followup.    Tanna Furry, MD 10/28/13 [phone]

## 2013-10-29 LAB — URINE CULTURE
COLONY COUNT: NO GROWTH
CULTURE: NO GROWTH

## 2013-11-12 ENCOUNTER — Telehealth: Payer: Self-pay | Admitting: Internal Medicine

## 2013-11-12 DIAGNOSIS — J9611 Chronic respiratory failure with hypoxia: Secondary | ICD-10-CM

## 2013-11-12 NOTE — Telephone Encounter (Signed)
lmtcb for [facility]

## 2013-11-12 NOTE — Telephone Encounter (Signed)
I spoke with the pt spouse and she states that the pt went to see Dr. Einar Gip and he advised that the pt was no longer in CHF. They did a walking sat test and the pt sats stayed 100% per pt spouse. She states Dr. Einar Gip is faxing these results. She states Dr. Einar Gip advised that the pt no longer needs oxygen, but they wanted to check with Dr. Melvyn Novas first before they stop it. If it is ok to stop oxygen they would like an order sent to D/C this. Please advise. [place], CMA

## 2013-11-12 NOTE — Telephone Encounter (Signed)
Fine with me to d/c  It

## 2013-11-13 NOTE — Telephone Encounter (Signed)
[phone] let it ring a few more times

## 2013-11-13 NOTE — Telephone Encounter (Signed)
lmtcb x1 

## 2013-11-13 NOTE — Telephone Encounter (Signed)
Spouse aware of below.  Order sent

## 2013-12-09 ENCOUNTER — Inpatient Hospital Stay (HOSPITAL_COMMUNITY): Payer: Medicare HMO

## 2013-12-09 ENCOUNTER — Ambulatory Visit (INDEPENDENT_AMBULATORY_CARE_PROVIDER_SITE_OTHER): Payer: Commercial Managed Care - HMO

## 2013-12-09 ENCOUNTER — Ambulatory Visit (INDEPENDENT_AMBULATORY_CARE_PROVIDER_SITE_OTHER): Payer: Commercial Managed Care - HMO | Admitting: Family Medicine

## 2013-12-09 ENCOUNTER — Inpatient Hospital Stay (HOSPITAL_COMMUNITY)
Admission: RE | Admit: 2013-12-09 | Discharge: 2013-12-14 | DRG: 194 | Disposition: A | Discharge status: Skilled Nursing Facility | Payer: Medicare HMO | Source: Ambulatory Visit | Attending: Internal Medicine | Admitting: Internal Medicine

## 2013-12-09 VITALS — BP 132/82 | HR 84 | Temp 97.6°F | Resp 17 | Ht 69.5 in | Wt 163.0 lb

## 2013-12-09 DIAGNOSIS — R4182 Altered mental status, unspecified: Secondary | ICD-10-CM

## 2013-12-09 DIAGNOSIS — I82409 Acute embolism and thrombosis of unspecified deep veins of unspecified lower extremity: Secondary | ICD-10-CM

## 2013-12-09 DIAGNOSIS — J449 Chronic obstructive pulmonary disease, unspecified: Secondary | ICD-10-CM | POA: Diagnosis present

## 2013-12-09 DIAGNOSIS — R05 Cough: Secondary | ICD-10-CM

## 2013-12-09 DIAGNOSIS — F0280 Dementia in other diseases classified elsewhere without behavioral disturbance: Secondary | ICD-10-CM | POA: Diagnosis present

## 2013-12-09 DIAGNOSIS — E119 Type 2 diabetes mellitus without complications: Secondary | ICD-10-CM | POA: Diagnosis present

## 2013-12-09 DIAGNOSIS — Z85038 Personal history of other malignant neoplasm of large intestine: Secondary | ICD-10-CM

## 2013-12-09 DIAGNOSIS — B3781 Candidal esophagitis: Secondary | ICD-10-CM

## 2013-12-09 DIAGNOSIS — D649 Anemia, unspecified: Secondary | ICD-10-CM

## 2013-12-09 DIAGNOSIS — I509 Heart failure, unspecified: Secondary | ICD-10-CM | POA: Diagnosis present

## 2013-12-09 DIAGNOSIS — M79605 Pain in left leg: Secondary | ICD-10-CM

## 2013-12-09 DIAGNOSIS — R0602 Shortness of breath: Secondary | ICD-10-CM

## 2013-12-09 DIAGNOSIS — E785 Hyperlipidemia, unspecified: Secondary | ICD-10-CM

## 2013-12-09 DIAGNOSIS — I499 Cardiac arrhythmia, unspecified: Secondary | ICD-10-CM

## 2013-12-09 DIAGNOSIS — Z87891 Personal history of nicotine dependence: Secondary | ICD-10-CM

## 2013-12-09 DIAGNOSIS — J189 Pneumonia, unspecified organism: Principal | ICD-10-CM | POA: Diagnosis present

## 2013-12-09 DIAGNOSIS — J441 Chronic obstructive pulmonary disease with (acute) exacerbation: Secondary | ICD-10-CM

## 2013-12-09 DIAGNOSIS — I824Y9 Acute embolism and thrombosis of unspecified deep veins of unspecified proximal lower extremity: Secondary | ICD-10-CM | POA: Diagnosis not present

## 2013-12-09 DIAGNOSIS — I10 Essential (primary) hypertension: Secondary | ICD-10-CM | POA: Diagnosis present

## 2013-12-09 DIAGNOSIS — R0609 Other forms of dyspnea: Secondary | ICD-10-CM

## 2013-12-09 DIAGNOSIS — IMO0002 Reserved for concepts with insufficient information to code with codable children: Secondary | ICD-10-CM

## 2013-12-09 DIAGNOSIS — L98499 Non-pressure chronic ulcer of skin of other sites with unspecified severity: Secondary | ICD-10-CM

## 2013-12-09 DIAGNOSIS — D509 Iron deficiency anemia, unspecified: Secondary | ICD-10-CM

## 2013-12-09 DIAGNOSIS — J069 Acute upper respiratory infection, unspecified: Secondary | ICD-10-CM

## 2013-12-09 DIAGNOSIS — G309 Alzheimer's disease, unspecified: Secondary | ICD-10-CM | POA: Diagnosis present

## 2013-12-09 DIAGNOSIS — R059 Cough, unspecified: Secondary | ICD-10-CM

## 2013-12-09 DIAGNOSIS — J9611 Chronic respiratory failure with hypoxia: Secondary | ICD-10-CM

## 2013-12-09 DIAGNOSIS — I5022 Chronic systolic (congestive) heart failure: Secondary | ICD-10-CM | POA: Diagnosis present

## 2013-12-09 DIAGNOSIS — I739 Peripheral vascular disease, unspecified: Secondary | ICD-10-CM | POA: Diagnosis present

## 2013-12-09 DIAGNOSIS — J4489 Other specified chronic obstructive pulmonary disease: Secondary | ICD-10-CM | POA: Diagnosis present

## 2013-12-09 HISTORY — DX: Anemia, unspecified: D64.9

## 2013-12-09 HISTORY — DX: Heart failure, unspecified: I50.9

## 2013-12-09 LAB — POCT CBC
GRANULOCYTE PERCENT: 8.1 % — AB (ref 37–80)
HCT, POC: 44.8 % (ref 43.5–53.7)
Hemoglobin: 13.1 g/dL — AB (ref 14.1–18.1)
Lymph, poc: 1.6 (ref 0.6–3.4)
MCH: 24.9 pg — AB (ref 27–31.2)
MCHC: 29.1 g/dL — AB (ref 31.8–35.4)
MCV: 85.3 fL (ref 80–97)
MID (CBC): 0.9 (ref 0–0.9)
MPV: 8.8 fL (ref 0–99.8)
PLATELET COUNT, POC: 257 10*3/uL (ref 142–424)
POC Granulocyte: 76.9 — AB (ref 2–6.9)
POC LYMPH PERCENT: 14.8 %L (ref 10–50)
POC MID %: 8.3 % (ref 0–12)
RBC: 5.26 M/uL (ref 4.69–6.13)
RDW, POC: 23 %
WBC: 10.5 10*3/uL — AB (ref 4.6–10.2)

## 2013-12-09 LAB — GLUCOSE, POCT (MANUAL RESULT ENTRY): POC Glucose: 138 mg/dl — AB (ref 70–99)

## 2013-12-09 LAB — BASIC METABOLIC PANEL
Anion gap: 15 (ref 5–15)
BUN: 14 mg/dL (ref 6–23)
CO2: 26 mEq/L (ref 19–32)
Calcium: 9.8 mg/dL (ref 8.4–10.5)
Chloride: 98 mEq/L (ref 96–112)
Creatinine, Ser: 1.03 mg/dL (ref 0.50–1.35)
GFR, EST AFRICAN AMERICAN: 79 mL/min — AB (ref >90)
GFR, EST NON AFRICAN AMERICAN: 68 mL/min — AB (ref >90)
Glucose, Bld: 105 mg/dL — ABNORMAL HIGH (ref 70–99)
POTASSIUM: 3.9 meq/L (ref 3.7–5.3)
Sodium: 139 mEq/L (ref 137–147)

## 2013-12-09 LAB — GLUCOSE, CAPILLARY: GLUCOSE-CAPILLARY: 110 mg/dL — AB (ref 70–99)

## 2013-12-09 MED ORDER — DEXTROSE 5 % IV SOLN
500.0000 mg | INTRAVENOUS | Status: DC
  Administered 2013-12-09 – 2013-12-12 (×4): 500 mg via INTRAVENOUS
  Filled 2013-12-09 (×6): qty 500

## 2013-12-09 MED ORDER — FERROUS SULFATE 325 (65 FE) MG PO TBEC: 325.0000 mg | DELAYED_RELEASE_TABLET | Freq: Every day | ORAL | Status: DC

## 2013-12-09 MED ORDER — FERROUS SULFATE 325 (65 FE) MG PO TABS
325.0000 mg | ORAL_TABLET | Freq: Every day | ORAL | Status: DC
  Administered 2013-12-10 – 2013-12-14 (×5): 325 mg via ORAL
  Filled 2013-12-09 (×6): qty 1

## 2013-12-09 MED ORDER — METOPROLOL TARTRATE 12.5 MG HALF TABLET
12.5000 mg | ORAL_TABLET | Freq: Two times a day (BID) | ORAL | Status: DC
  Administered 2013-12-09 – 2013-12-14 (×10): 12.5 mg via ORAL
  Filled 2013-12-09 (×11): qty 1

## 2013-12-09 MED ORDER — DEXTROSE 5 % IV SOLN
1.0000 g | INTRAVENOUS | Status: DC
  Administered 2013-12-09 – 2013-12-13 (×5): 1 g via INTRAVENOUS
  Filled 2013-12-09 (×7): qty 10

## 2013-12-09 MED ORDER — NYSTATIN 100000 UNIT/ML MT SUSP
5.0000 mL | Freq: Four times a day (QID) | OROMUCOSAL | Status: DC
  Administered 2013-12-09 – 2013-12-14 (×20): 500000 [IU] via ORAL
  Filled 2013-12-09 (×21): qty 5

## 2013-12-09 MED ORDER — SODIUM CHLORIDE 0.9 % IV SOLN
INTRAVENOUS | Status: DC
  Administered 2013-12-09 – 2013-12-11 (×4): via INTRAVENOUS
  Administered 2013-12-12: 75 mL/h via INTRAVENOUS

## 2013-12-09 MED ORDER — ASPIRIN EC 81 MG PO TBEC
81.0000 mg | DELAYED_RELEASE_TABLET | Freq: Every day | ORAL | Status: DC
  Administered 2013-12-09 – 2013-12-14 (×6): 81 mg via ORAL
  Filled 2013-12-09 (×6): qty 1

## 2013-12-09 MED ORDER — ATORVASTATIN CALCIUM 40 MG PO TABS
40.0000 mg | ORAL_TABLET | Freq: Every day | ORAL | Status: DC
  Administered 2013-12-10 – 2013-12-14 (×5): 40 mg via ORAL
  Filled 2013-12-09 (×5): qty 1

## 2013-12-09 MED ORDER — ALBUTEROL SULFATE (2.5 MG/3ML) 0.083% IN NEBU: 2.5000 mg | INHALATION_SOLUTION | Freq: Four times a day (QID) | RESPIRATORY_TRACT | Status: DC | PRN

## 2013-12-09 MED ORDER — BUDESONIDE-FORMOTEROL FUMARATE 160-4.5 MCG/ACT IN AERO
2.0000 | INHALATION_SPRAY | Freq: Two times a day (BID) | RESPIRATORY_TRACT | Status: DC
  Administered 2013-12-10 – 2013-12-14 (×9): 2 via RESPIRATORY_TRACT
  Filled 2013-12-09 (×3): qty 6

## 2013-12-09 MED ORDER — HEPARIN SODIUM (PORCINE) 5000 UNIT/ML IJ SOLN
5000.0000 [IU] | Freq: Three times a day (TID) | INTRAMUSCULAR | Status: DC
  Administered 2013-12-10 – 2013-12-12 (×5): 5000 [IU] via SUBCUTANEOUS
  Filled 2013-12-09 (×10): qty 1

## 2013-12-09 MED ORDER — DIGOXIN 125 MCG PO TABS
0.1250 mg | ORAL_TABLET | Freq: Every day | ORAL | Status: DC
  Administered 2013-12-10 – 2013-12-14 (×5): 0.125 mg via ORAL
  Filled 2013-12-09 (×5): qty 1

## 2013-12-09 NOTE — Progress Notes (Signed)
Schorr (Provider) notified of elevated BP, will continue to monitor.

## 2013-12-09 NOTE — Progress Notes (Signed)
ANTIBIOTIC CONSULT NOTE - INITIAL  Pharmacy Consult for antibiotic renal dose adjustment  Indication: CAP  No Known Allergies   Labs:  Recent Labs  12/09/13 1641  WBC 10.5*  HGB 13.1*   The CrCl is unknown because both a height and weight (above a minimum accepted value) are required for this calculation. No results found for this basename: VANCOTROUGH, VANCOPEAK, VANCORANDOM, GENTTROUGH, GENTPEAK, GENTRANDOM, TOBRATROUGH, TOBRAPEAK, TOBRARND, AMIKACINPEAK, AMIKACINTROU, AMIKACIN,  in the last 72 hours    Assessment: 79 y/o male who was admitted for CAP and started on azithromycin and ceftriaxone. These antibiotics do not need renal adjustment and dose is appropriate.  Plan:  - Continue current antibiotics - Pharmacy signing off, please re-consult if needed  [facility], [place].D., BCPS Clinical Pharmacist Pager: [phone] 12/09/2013 7:52 PM

## 2013-12-09 NOTE — H&P (Addendum)
[facility] History and Physical  [name] DDU:[id number] DOB: 22-Feb-1935 DOA: 12/09/2013  Referring physician: EDP PCP: [name], MD   Chief Complaint: AMS   HPI: [name] is a 79 y.o. male who is brought in to UC by wife with c/o cough, congestion, SOB, decreased PO intake, decreased UOP, and altered mental status.  Wife notes that about 10 days ago he seems to have come down with a URI like cold.  This progressed into wet sounding cough, and congestion as well as AMS.  Patient is somewhat demented at baseline and not able to give great history.  He was seen at [facility] today and found to have LLL opacity suspect for PNA.  Review of Systems: Unable to perform due to dementia.  Past Medical History  Diagnosis Date  . Hyperlipidemia   . PVD (peripheral vascular disease)   . Hypertension   . COPD (chronic obstructive pulmonary disease)   . Colon cancer   . Alzheimer's dementia 2013  . Pneumonia 2014  . Dyspnea   . Type II diabetes mellitus   . Depression    Past Surgical History  Procedure Laterality Date  . Colon surgery  1996    cancer  . Lumbar disc surgery  2005  . Vascular surgery    . Colon resection  1997    /enc. notes 05/17/2004 (04/28/2013)  . Femoral artery - femoral artery bypass graft      right to left/enc. notes 05/17/2004 (04/28/2013)  . Femoral bypass Right     /enc. notes 05/17/2004 (04/28/2013)  . Fiberoptic bronchoscopy      /enc. notes 03/16/2005 (04/28/2013)  . Cataract extraction w/ intraocular lens implant Left 01/2013  . Cataract extraction Right 02/2013  . Femoral-popliteal bypass graft Right 04/29/2013    Procedure:  FEMORAL-POPLITEAL ARTERY Bypass Graft with intraoperative ultrasound and arteriogarm;  Surgeon: Mal Misty, MD;  Location: [place];  Service: Vascular;  Laterality: Right;  . Intraoperative arteriogram Right 04/29/2013    Procedure: INTRA OPERATIVE ARTERIOGRAM;  Surgeon: Mal Misty, MD;  Location: [place];  Service:  Vascular;  Laterality: Right;  . Spine surgery    . Esophagogastroduodenoscopy N/A 09/05/2013    Procedure: ESOPHAGOGASTRODUODENOSCOPY (EGD);  Surgeon: Lear Ng, MD;  Location: [facility] ENDOSCOPY;  Service: Endoscopy;  Laterality: N/A;   Social History:  reports that he quit smoking about 20 years ago. His smoking use included Cigarettes. He has a 12 pack-year smoking history. He has never used smokeless tobacco. He reports that he does not drink alcohol or use illicit drugs.  No Known Allergies  Family History  Problem Relation Age of Onset  . Stroke Mother 58  . Hypertension Mother   . Cancer Sister   . Heart disease Sister   . Heart disease Brother     Heart Disease before age 38  . Heart attack Brother   . Cancer Sister   . Cancer Brother      Prior to Admission medications   Medication Sig Start Date End Date Taking? Authorizing Provider  albuterol (PROVENTIL) (2.5 MG/3ML) 0.083% nebulizer solution Take 2.5 mg by nebulization every 6 (six) hours as needed for wheezing or shortness of breath.    Historical Provider, MD  aspirin EC 81 MG EC tablet Take 1 tablet (81 mg total) by mouth daily. 09/07/13   Samuella Cota, MD  budesonide-formoterol [facility]) 160-4.5 MCG/ACT inhaler Inhale 2 puffs into the lungs 2 (two) times daily.    Historical Provider,  MD  Cholecalciferol (VITAMIN D) 2000 UNITS CAPS Take 1 capsule by mouth at bedtime.    Historical Provider, MD  digoxin (LANOXIN) 0.125 MG tablet Take by mouth daily.    Historical Provider, MD  ferrous sulfate 325 (65 FE) MG EC tablet Take 1 tablet (325 mg total) by mouth daily with breakfast. 09/07/13   Samuella Cota, MD  furosemide (LASIX) 20 MG tablet Take 1 tablet (20 mg total) by mouth daily. 10/27/13   Tanna Furry, MD  metFORMIN (GLUCOPHAGE) 500 MG tablet Take 500 mg by mouth daily.    Historical Provider, MD  metoprolol tartrate (LOPRESSOR) 25 MG tablet Take 0.5 tablets (12.5 mg total) by mouth 2 (two) times daily.  09/07/13   Samuella Cota, MD  nystatin (MYCOSTATIN) 100000 UNIT/ML suspension Take 5 mLs (500,000 Units total) by mouth 4 (four) times daily. 09/07/13   Samuella Cota, MD  olmesartan (BENICAR) 40 MG tablet Take 40 mg by mouth daily.    Historical Provider, MD  rosuvastatin (CRESTOR) 20 MG tablet Take 20 mg by mouth at bedtime.     Historical Provider, MD   Physical Exam: There were no vitals filed for this visit.  There were no vitals taken for this visit.  General Appearance:    Alert, demented, no distress, appears stated age  Head:    Normocephalic, atraumatic  Eyes:    PERRL, EOMI, sclera non-icteric        Nose:   Nares without drainage or epistaxis. Mucosa, turbinates normal  Throat:   Dry mucous membranes. Oropharynx without erythema or exudate.  Neck:   Supple. No carotid bruits.  No thyromegaly.  No lymphadenopathy.   Back:     No CVA tenderness, no spinal tenderness  Lungs:     Diminished at left base.  Chest wall:    No tenderness to palpitation  Heart:    Regular rate and rhythm without murmurs, gallops, rubs  Abdomen:     Soft, non-tender, nondistended, normal bowel sounds, no organomegaly  Genitalia:    deferred  Rectal:    deferred  Extremities:   No clubbing, cyanosis or edema.  Pulses:   2+ and symmetric all extremities  Skin:   Skin color, texture, turgor normal, no rashes or lesions  Lymph nodes:   Cervical, supraclavicular, and axillary nodes normal  Neurologic:   CNII-XII intact. Normal strength, sensation and reflexes      throughout    Labs on Admission:  Basic Metabolic Panel: No results found for this basename: NA, K, CL, CO2, GLUCOSE, BUN, CREATININE, CALCIUM, MG, PHOS,  in the last 168 hours Liver Function Tests: No results found for this basename: AST, ALT, ALKPHOS, BILITOT, PROT, ALBUMIN,  in the last 168 hours No results found for this basename: LIPASE, AMYLASE,  in the last 168 hours No results found for this basename: AMMONIA,  in the last  168 hours CBC:  Recent Labs Lab 12/09/13 1641  WBC 10.5*  HGB 13.1*  HCT 44.8  MCV 85.3   Cardiac Enzymes: No results found for this basename: CKTOTAL, CKMB, CKMBINDEX, TROPONINI,  in the last 168 hours  BNP (last 3 results)  Recent Labs  09/06/13 1038 09/23/13 1522 10/27/13 2303  PROBNP 1900.0* 673.0* 3057.0*   CBG: No results found for this basename: GLUCAP,  in the last 168 hours  Radiological Exams on Admission: No results found.  EKG: Independently reviewed.  Assessment/Plan Principal Problem:   CAP (community acquired pneumonia) Active Problems:  HYPERTENSION NEC   Chronic systolic CHF (congestive heart failure)   1. CAP - given the history of URI progressing to lower symptoms as well as the report of LLL PNA on CXR, patient most likely has CAP.  DDX includes CHF exacerbation. 1. BMP, CXR, cultures, UA, all pending 2. Starting rocephin and azithromycin for CAP coverage, patient is just over 3 months since last admit so not treating as HCAP at this point. 3. Will start NS at 75 cc/hr as he certainly seems dry on exam and history.  This may have to be increased or decreased based on results of BMP. 2. DM2 - holding metformin until BMP is back 3. HTN - holding lasix and lisinopril until BMP is back, suspect patient may have AKI given the report of poor PO intake and diminished UOP.    Code Status: Full  Family Communication: Wife at bedside Disposition Plan: Admit to inpatient   Time spent: 5 min  Reneka Nebergall M. [facility] Pager [phone]  If 7AM-7PM, please contact the day team taking care of the patient [email or web address] Password [facility] 12/09/2013, 8:18 PM

## 2013-12-09 NOTE — Patient Instructions (Signed)
Please proceed to the [facility]- admitting for direct admission to [facility] team 10

## 2013-12-09 NOTE — Progress Notes (Signed)
[name] [id number] Code Status: Full   Admission Data: 12/09/2013 7:27 PM Attending Provider:  Vega NIO:[id number],XFGHW N, MD Consults/ Treatment Team:    [name] is a 79 y.o. male patient admitted from ED awake, alert - oriented  X 3 - no acute distress noted.  VSS - There were no vitals taken for this visit.    IV in place, occlusive dsg intact without redness.  Orientation to room, and floor completed with information packet given to patient/family.  Admission INP armband ID verified with patient/family, and in place.   SR up x 2, fall assessment complete, with patient and family able to verbalize understanding of risk associated with falls, and verbalized understanding to call nsg before up out of bed.  Call light within reach, patient able to voice, and demonstrate understanding.  Skin, clean-dry- intact without evidence of bruising, or skin tears.   No evidence of skin break down noted on exam.     Will cont to eval and treat per MD orders.  Henriette Combs, [place] 12/09/2013 7:27 PM

## 2013-12-09 NOTE — Progress Notes (Signed)
[facility] [address], [place] [address] [phone]  Date:  12/09/2013   Name:  [name]   DOB:  Aug 03, 1934   MRN:  [id number]  PCP:  [name], MD    Chief Complaint: Shortness of Breath and URI   History of Present Illness:  [name] is a 79 y.o. very pleasant male patient who presents with the following:  Here today with his wife.  He has been ill for about 10 days with "a cold" and has been using cough medication. He continues to cough a lot, he is not sleeping well.  He has seemed to not feel well but due to his dementia he is not able to communicate his exact sx.   . He has been sleeping more than usual and has not seemed like himself; his wife reports that he keeps falling asleep.  He did drink a few sips of water today but has not been able to eat.   Wife reports that his mental status is well below baseline.   They have not noted a fever.   He vomited once this am when he tried to eat, otherwise no vomiting,   His PCP is DR. Sanders.   He was in the hospital for several days over the summer with pneumonia, respiratory failure and COPD exacerbation.  He was sent home with O2 for a few weeks but this has since been stopped  Patient Active Problem List   Diagnosis Date Noted  . Candida esophagitis 09/06/2013  . Chronic systolic CHF (congestive heart failure) 09/06/2013  . CAP (community acquired pneumonia) 09/05/2013  . COPD exacerbation 09/04/2013  . Microcytic anemia 09/03/2013  . Chronic respiratory failure with hypoxia 09/01/2013  . Symptomatic anemia 09/01/2013  . DOE (dyspnea on exertion) 09/01/2013  . Atherosclerosis of native arteries of the extremities with ulceration(440.23) 06/24/2013  . PAD (peripheral artery disease) 04/28/2013  . Chest pain 03/23/2013  . Nausea with vomiting 03/23/2013  . Pain in joint, ankle and foot 03/23/2013  . Dementia 07/04/2012  . URI (upper respiratory infection) 07/04/2012  . PVD  (peripheral vascular disease) 04/21/2012  . Leg pain 04/18/2012  . COUGH 06/02/2007  . HYPERLIPIDEMIA 05/07/2007  . PVD 05/07/2007  . COPD COPD II  05/07/2007  . DYSPNEA 05/07/2007  . HYPERTENSION NEC 05/07/2007    Past Medical History  Diagnosis Date  . Hyperlipidemia   . PVD (peripheral vascular disease)   . Hypertension   . COPD (chronic obstructive pulmonary disease)   . Colon cancer   . Alzheimer's dementia 2013  . Pneumonia 2014  . Dyspnea   . Type II diabetes mellitus   . Depression     Past Surgical History  Procedure Laterality Date  . Colon surgery  1996    cancer  . Lumbar disc surgery  2005  . Vascular surgery    . Colon resection  1997    /enc. notes 05/17/2004 (04/28/2013)  . Femoral artery - femoral artery bypass graft      right to left/enc. notes 05/17/2004 (04/28/2013)  . Femoral bypass Right     /enc. notes 05/17/2004 (04/28/2013)  . Fiberoptic bronchoscopy      /enc. notes 03/16/2005 (04/28/2013)  . Cataract extraction w/ intraocular lens implant Left 01/2013  . Cataract extraction Right 02/2013  . Femoral-popliteal bypass graft Right 04/29/2013    Procedure:  FEMORAL-POPLITEAL ARTERY Bypass Graft with intraoperative ultrasound and arteriogarm;  Surgeon: Mal Misty, MD;  Location: [facility] OR;  Service: Vascular;  Laterality: Right;  . Intraoperative arteriogram Right 04/29/2013    Procedure: INTRA OPERATIVE ARTERIOGRAM;  Surgeon: Mal Misty, MD;  Location: [place];  Service: Vascular;  Laterality: Right;  . Spine surgery    . Esophagogastroduodenoscopy N/A 09/05/2013    Procedure: ESOPHAGOGASTRODUODENOSCOPY (EGD);  Surgeon: Lear Ng, MD;  Location: [facility] ENDOSCOPY;  Service: Endoscopy;  Laterality: N/A;    History  Substance Use Topics  . Smoking status: Former Smoker -- 0.30 packs/day for 40 years    Types: Cigarettes    Quit date: 03/26/1993  . Smokeless tobacco: Never Used  . Alcohol Use: No    Family History  Problem Relation Age of Onset   . Stroke Mother 15  . Hypertension Mother   . Cancer Sister   . Heart disease Sister   . Heart disease Brother     Heart Disease before age 9  . Heart attack Brother   . Cancer Sister   . Cancer Brother     No Known Allergies  Medication list has been reviewed and updated.  Current Outpatient Prescriptions on File Prior to Visit  Medication Sig Dispense Refill  . aspirin EC 81 MG EC tablet Take 1 tablet (81 mg total) by mouth daily.      . budesonide-formoterol (SYMBICORT) 160-4.5 MCG/ACT inhaler Inhale 2 puffs into the lungs 2 (two) times daily.      . Cholecalciferol (VITAMIN D) 2000 UNITS CAPS Take 1 capsule by mouth at bedtime.      . ferrous sulfate 325 (65 FE) MG EC tablet Take 1 tablet (325 mg total) by mouth daily with breakfast.  30 tablet  0  . furosemide (LASIX) 20 MG tablet Take 1 tablet (20 mg total) by mouth daily.  5 tablet  0  . metoprolol tartrate (LOPRESSOR) 25 MG tablet Take 0.5 tablets (12.5 mg total) by mouth 2 (two) times daily.  30 tablet  0  . nystatin (MYCOSTATIN) 100000 UNIT/ML suspension Take 5 mLs (500,000 Units total) by mouth 4 (four) times daily.  180 mL  0  . olmesartan (BENICAR) 40 MG tablet Take 40 mg by mouth daily.      . rosuvastatin (CRESTOR) 20 MG tablet Take 20 mg by mouth at bedtime.       Marland Kitchen albuterol (PROVENTIL) (2.5 MG/3ML) 0.083% nebulizer solution Take 2.5 mg by nebulization every 6 (six) hours as needed for wheezing or shortness of breath.      . metFORMIN (GLUCOPHAGE) 500 MG tablet Take 500 mg by mouth daily.       No current facility-administered medications on file prior to visit.    Review of Systems:  As per HPI- otherwise negative.   Physical Examination: Filed Vitals:   12/09/13 1503  BP: 132/82  Pulse: 84  Temp: 97.6 F (36.4 C)  Resp: 17   Filed Vitals:   12/09/13 1503  Height: 5' 9.5" (1.765 m)  Weight: 163 lb (73.936 kg)   Body mass index is 23.73 kg/(m^2). Ideal Body Weight: Weight in (lb) to have BMI =  25: 171.4  GEN: WDWN, NAD, Non-toxic, A & O x 3, thin and frail.  Sitting slumped over in chair and seems to be asleep.  However he did then rouse and was able to speak some.  He is not able to answer most questions due to dementia.   HEENT: Atraumatic, Normocephalic. Neck supple. No masses, No LAD. Ears and Nose: No external deformity. CV: RRR,  No M/G/R. No JVD. No thrill. No extra heart sounds. PULM: CTA B, no wheezes, crackles, rhonchi. No retractions. No resp. distress. No accessory muscle use. ABD: S, NT, ND. No rebound. No HSM. EXTR: No c/c/e NEURO Normal gait.  PSYCH: Normally interactive. Conversant. Not depressed or anxious appearing.  Calm demeanor.   EKG:  Sinus rhythm, frequent PVCs.  See paper copy  [facility] reading (PRIMARY) by  Dr. Lorelei Pont. CXR: there is an effusion vs pneumonia in the LLL  Results for orders placed in visit on 12/09/13  POCT CBC      Result Value Ref Range   WBC 10.5 (*) 4.6 - 10.2 K/uL   Lymph, poc 1.6  0.6 - 3.4   POC LYMPH PERCENT 14.8  10 - 50 %L   MID (cbc) 0.9  0 - 0.9   POC MID % 8.3  0 - 12 %M   POC Granulocyte 76.9 (*) 2 - 6.9   Granulocyte percent 8.1 (*) 37 - 80 %G   RBC 5.26  4.69 - 6.13 M/uL   Hemoglobin 13.1 (*) 14.1 - 18.1 g/dL   HCT, POC 44.8  43.5 - 53.7 %   MCV 85.3  80 - 97 fL   MCH, POC 24.9 (*) 27 - 31.2 pg   MCHC 29.1 (*) 31.8 - 35.4 g/dL   RDW, POC 23.0     Platelet Count, POC 257  142 - 424 K/uL   MPV 8.8  0 - 99.8 fL  GLUCOSE, POCT (MANUAL RESULT ENTRY)      Result Value Ref Range   POC Glucose 138 (*) 70 - 99 mg/dl   Attempted to get urine sample; pt could not void, but then apparently did urinate before I was able to try and in and out cath- he did not remember to use cup to collect sample  Assessment and Plan: Altered mental status, unspecified altered mental status type - Plan: POCT CBC, POCT glucose (manual entry), POCT UA - Microscopic Only, POCT urinalysis dipstick, Urine culture  Irregular heart rhythm - Plan:  EKG 12-Lead, DG Chest 2 View  Victory is here today with altered mental status, poor/ no urine output and lack of PO intake, as well as apparent pneumonia on today's CXR.  His wife is stressed and not able to handle his care by herself at home.  Hospitalist service kindly agreed to admit this gentleman for further evaluation and treatment.    Signed Lamar Blinks, MD

## 2013-12-10 ENCOUNTER — Encounter (HOSPITAL_COMMUNITY): Payer: Self-pay | Admitting: General Practice

## 2013-12-10 LAB — URINALYSIS, ROUTINE W REFLEX MICROSCOPIC
GLUCOSE, UA: NEGATIVE mg/dL
Ketones, ur: 15 mg/dL — AB
LEUKOCYTES UA: NEGATIVE
Nitrite: NEGATIVE
Protein, ur: >300 mg/dL — AB
Specific Gravity, Urine: 1.026 (ref 1.005–1.030)
UROBILINOGEN UA: 1 mg/dL (ref 0.0–1.0)
pH: 5.5 (ref 5.0–8.0)

## 2013-12-10 LAB — GLUCOSE, CAPILLARY
GLUCOSE-CAPILLARY: 112 mg/dL — AB (ref 70–99)
GLUCOSE-CAPILLARY: 177 mg/dL — AB (ref 70–99)
Glucose-Capillary: 144 mg/dL — ABNORMAL HIGH (ref 70–99)
Glucose-Capillary: 139 mg/dL — ABNORMAL HIGH (ref 70–99)

## 2013-12-10 LAB — URINE MICROSCOPIC-ADD ON

## 2013-12-10 LAB — HIV ANTIBODY (ROUTINE TESTING W REFLEX): HIV: NONREACTIVE

## 2013-12-10 LAB — STREP PNEUMONIAE URINARY ANTIGEN: Strep Pneumo Urinary Antigen: NEGATIVE

## 2013-12-10 MED ORDER — IRBESARTAN 300 MG PO TABS
300.0000 mg | ORAL_TABLET | Freq: Every day | ORAL | Status: DC
  Administered 2013-12-10 – 2013-12-14 (×5): 300 mg via ORAL
  Filled 2013-12-10 (×5): qty 1

## 2013-12-10 MED ORDER — GUAIFENESIN ER 600 MG PO TB12
600.0000 mg | ORAL_TABLET | Freq: Two times a day (BID) | ORAL | Status: DC
  Administered 2013-12-10 – 2013-12-14 (×9): 600 mg via ORAL
  Filled 2013-12-10 (×10): qty 1

## 2013-12-10 NOTE — Progress Notes (Addendum)
Patient Demographics  [name], is a 79 y.o. male, DOB - 1934/12/31, ERX:[id number]  Admit date - 12/09/2013   Admitting Physician Velvet Bathe, MD  Outpatient Primary MD for the patient is [name], MD  LOS - 1   No chief complaint on file.  brief history of present illness: 79 year old male presents with cough, congestion, shortness of breath, poor appetite, chest x-ray show left lower lung opacity suspicious for pneumonia, a shunt was started on IV Rocephin and azithromycin for community-acquired pneumonia 9/16, remains a febrile.   Subjective:   [name] today has, No headache, No chest pain, No abdominal pain - No Nausea, No new weakness , still complains of cough and productive sputum.  Assessment & Plan    Principal Problem:   CAP (community acquired pneumonia) Active Problems:   HYPERTENSION NEC   Chronic systolic CHF (congestive heart failure)  Community-acquired pneumonia - Continue with IV Rocephin and azithromycin, start Mucinex, and flutter valve,  Is complaining of productive sputum, a febrile, no hypoxia. Diabetes mellitus - Patient is off his oral hypoglycemic agent, seems to be controlled with diet, continue to monitor. Hypertension - Acceptable, continue with home. Hyper lipidemia - Continue with statin  Code Status: Full Code.  Family Communication: Wife at bedside  Disposition Plan: home in 48 hours   Procedures    Consults     Medications  Scheduled Meds: . aspirin EC  81 mg Oral Daily  . atorvastatin  40 mg Oral q1800  . azithromycin  500 mg Intravenous Q24H  . budesonide-formoterol  2 puff Inhalation BID  . cefTRIAXone (ROCEPHIN)  IV  1 g Intravenous Q24H  . digoxin  0.125 mg Oral Daily  . ferrous sulfate  325 mg Oral Q breakfast  . guaiFENesin  600 mg Oral BID  . heparin  5,000 Units Subcutaneous 3  times per day  . metoprolol tartrate  12.5 mg Oral BID  . nystatin  5 mL Oral QID   Continuous Infusions: . sodium chloride 75 mL/hr at 12/09/13 2128   PRN Meds:.albuterol  DVT Prophylaxis   Heparin - SCDs   Lab Results  Component Value Date   PLT 423* 10/27/2013    Antibiotics   Anti-infectives   Start     Dose/Rate Route Frequency Ordered Stop   12/09/13 2000  cefTRIAXone (ROCEPHIN) 1 g in dextrose 5 % 50 mL IVPB     1 g 100 mL/hr over 30 Minutes Intravenous Every 24 hours 12/09/13 1947 12/16/13 1959   12/09/13 2000  azithromycin (ZITHROMAX) 500 mg in dextrose 5 % 250 mL IVPB     500 mg 250 mL/hr over 60 Minutes Intravenous Every 24 hours 12/09/13 1947 12/16/13 1959          Objective:   Filed Vitals:   12/10/13 0508 12/10/13 0823 12/10/13 0824 12/10/13 0955  BP: 142/84   152/85  Pulse: 89 88  91  Temp: 97.8 F (36.6 C)     TempSrc: Oral     Resp: 18     Height:      Weight:      SpO2: 99% 97% 97%     Wt Readings from Last 3 Encounters:  12/09/13 72.938 kg (160 lb 12.8 oz)  12/09/13 73.936 kg (163 lb)  09/23/13 76.658 kg (169 lb)     Intake/Output Summary (Last 24 hours) at 12/10/13 1158 Last data filed at 12/10/13 1009  Gross per 24 hour  Intake 918.75 ml  Output      0 ml  Net 918.75 ml     Physical Exam  Awake Alert, Oriented X 3, No new F.N deficits, Normal affect [place].AT,PERRAL Supple Neck,No JVD, No cervical lymphadenopathy appriciated.  Symmetrical Chest wall movement, Good air movement bilaterally, mild rales at bases. RRR,No Gallops,Rubs or new Murmurs, No Parasternal Heave +ve B.Sounds, Abd Soft, No tenderness, No organomegaly appriciated, No rebound - guarding or rigidity. No Cyanosis, Clubbing or edema, No new Rash or bruise     Data Review   Micro Results No results found for this or any previous visit (from the past 240 hour(s)).  Radiology Reports Dg Chest 1 View  12/09/2013   CLINICAL DATA:  Shortness of breath.  EXAM:  CHEST - 1 VIEW  COMPARISON:  Chest radiograph performed earlier today at 3:44 p.m.  FINDINGS: There is a persistent small left pleural effusion, with minimal left basilar opacity that may reflect atelectasis or mild pneumonia. The right lung appears clear. No pneumothorax is seen  The cardiomediastinal silhouette is normal in size. No acute osseous abnormalities are identified.  IMPRESSION: Persistent small left pleural effusion. Minimal left basilar airspace opacity may reflect atelectasis or mild pneumonia.   Electronically Signed   By: Garald Balding M.D.   On: 12/09/2013 22:02   Dg Chest 2 View  12/09/2013   CLINICAL DATA:  Irregular heart rhythm.  Cough.  EXAM: CHEST  2 VIEW  COMPARISON:  10/27/2013 and chest CT dated 09/01/2013  FINDINGS: Heart size and pulmonary vascularity are normal. There is chronic pleural thickening at the right lung base. There is a new small left effusion. There is chronic accentuation of the interstitial markings at both bases. No acute osseous abnormality.  IMPRESSION: New small left effusion. Chronic bibasilar interstitial lung disease with scarring at the right base.   Electronically Signed   By: Rozetta Nunnery M.D.   On: 12/09/2013 20:23    CBC  Recent Labs Lab 12/09/13 1641  WBC 10.5*  HGB 13.1*  HCT 44.8  MCV 85.3  MCH 24.9*  MCHC 29.1*    Chemistries   Recent Labs Lab 12/09/13 2036  NA 139  K 3.9  CL 98  CO2 26  GLUCOSE 105*  BUN 14  CREATININE 1.03  CALCIUM 9.8   ------------------------------------------------------------------------------------------------------------------ estimated creatinine clearance is 61.9 ml/min (by C-G formula based on Cr of 1.03). ------------------------------------------------------------------------------------------------------------------ No results found for this basename: HGBA1C,  in the last 72  hours ------------------------------------------------------------------------------------------------------------------ No results found for this basename: CHOL, HDL, LDLCALC, TRIG, CHOLHDL, LDLDIRECT,  in the last 72 hours ------------------------------------------------------------------------------------------------------------------ No results found for this basename: TSH, T4TOTAL, FREET3, T3FREE, THYROIDAB,  in the last 72 hours ------------------------------------------------------------------------------------------------------------------ No results found for this basename: VITAMINB12, FOLATE, FERRITIN, TIBC, IRON, RETICCTPCT,  in the last 72 hours  Coagulation profile No results found for this basename: INR, PROTIME,  in the last 168 hours  No results found for this basename: DDIMER,  in the last 72 hours  Cardiac Enzymes No results found for this basename: CK, CKMB, TROPONINI, MYOGLOBIN,  in the last 168 hours ------------------------------------------------------------------------------------------------------------------ No components found with this basename: POCBNP,      Time Spent in minutes   35 Minutes.   [facility], Danitza Schoenfeldt M.D on 12/10/2013 at 11:58 AM  Between 7am to 7pm - Pager - [phone]  After 7pm go to [email or web address] - password [email or web address]  And look for the night coverage person covering for me after hours  [facility] Office  [phone]   **Disclaimer: This note may have been dictated with voice recognition software. Similar sounding words can inadvertently be transcribed and this note may contain transcription errors which may not have been corrected upon publication of note.**

## 2013-12-10 NOTE — Progress Notes (Signed)
Utilization review completed. Desarae Placide, RN, BSN. 

## 2013-12-11 DIAGNOSIS — J189 Pneumonia, unspecified organism: Secondary | ICD-10-CM | POA: Diagnosis present

## 2013-12-11 LAB — BASIC METABOLIC PANEL
Anion gap: 19 — ABNORMAL HIGH (ref 5–15)
BUN: 16 mg/dL (ref 6–23)
CALCIUM: 8.7 mg/dL (ref 8.4–10.5)
CO2: 19 mEq/L (ref 19–32)
Chloride: 101 mEq/L (ref 96–112)
Creatinine, Ser: 0.87 mg/dL (ref 0.50–1.35)
GFR calc Af Amer: >90 mL/min (ref >90)
GFR, EST NON AFRICAN AMERICAN: 81 mL/min — AB (ref >90)
GLUCOSE: 125 mg/dL — AB (ref 70–99)
Potassium: 4.2 mEq/L (ref 3.7–5.3)
Sodium: 139 mEq/L (ref 137–147)

## 2013-12-11 LAB — CBC
HEMATOCRIT: 40.9 % (ref 39.0–52.0)
Hemoglobin: 13 g/dL (ref 13.0–17.0)
MCH: 25.1 pg — AB (ref 26.0–34.0)
MCHC: 31.8 g/dL (ref 30.0–36.0)
MCV: 79.1 fL (ref 78.0–100.0)
Platelets: 271 10*3/uL (ref 150–400)
RBC: 5.17 MIL/uL (ref 4.22–5.81)
RDW: 19.5 % — ABNORMAL HIGH (ref 11.5–15.5)
WBC: 10 10*3/uL (ref 4.0–10.5)

## 2013-12-11 LAB — GLUCOSE, CAPILLARY
GLUCOSE-CAPILLARY: 118 mg/dL — AB (ref 70–99)
GLUCOSE-CAPILLARY: 156 mg/dL — AB (ref 70–99)
Glucose-Capillary: 149 mg/dL — ABNORMAL HIGH (ref 70–99)
Glucose-Capillary: 166 mg/dL — ABNORMAL HIGH (ref 70–99)
Glucose-Capillary: 145 mg/dL — ABNORMAL HIGH (ref 70–99)

## 2013-12-11 LAB — LEGIONELLA ANTIGEN, URINE: Legionella Antigen, Urine: NEGATIVE

## 2013-12-11 MED ORDER — CETYLPYRIDINIUM CHLORIDE 0.05 % MT LIQD
7.0000 mL | Freq: Two times a day (BID) | OROMUCOSAL | Status: DC
  Administered 2013-12-11 – 2013-12-14 (×5): 7 mL via OROMUCOSAL

## 2013-12-11 NOTE — Clinical Social Work Psychosocial (Addendum)
Clinical Social Work Department BRIEF PSYCHOSOCIAL ASSESSMENT 12/11/2013  Patient:  [name], [name]     Account Number:  [id number]     Admit date:  12/09/2013  Clinical Social Worker:  Jennette Dubin  Date/Time:  12/11/2013 06:04 PM  Referred by:  Physician  Date Referred:  12/11/2013 Referred for  SNF Placement   Other Referral:   N/A   Interview type:  Other - See comment Other interview type:   Pt.'s wife, Gladys Gutman    PSYCHOSOCIAL DATA Living Status:  WIFE Admitted from facility:   Level of care:   Primary support name:  [name] Primary support relationship to patient:  SPOUSE Degree of support available:   Good support    CURRENT CONCERNS  Other Concerns:    SOCIAL WORK ASSESSMENT / PLAN Pt admitted to hospital for altered mental status. Pt is married and lives wit his wife, [name]. Pt has 3 children that reside in [place]. Pt was seen by Physical and Speech Therapy who are recommending SNF for short term rehab. SW spoke with pt.'s wife in regards to SNF choice, pt.'s wife unsure and asked that pt be faxed out to facilities in [facility] and SW will present bed offers to family. Pt has [employer] Medicare-[employer]. SW spoke with Cephus Richer w/ [employer] [phone], who reported an authorization will be given but not until we have a firm discharge date.   Assessment/plan status:  Psychosocial Support/Ongoing Assessment of Needs Other assessment/ plan:   N/A   Information/referral to community resources:   N/a    PATIENT'S/FAMILY'S RESPONSE TO PLAN OF CARE: SW will continue to follow.     Charlene Brooke, MSW Clinical Social Worker [phone]

## 2013-12-11 NOTE — Clinical Social Work Placement (Signed)
Clinical Social Work Department CLINICAL SOCIAL WORK PLACEMENT NOTE 12/11/2013  Patient:  [name], [name]  Account Number:  [id number] Admit date:  12/09/2013  Clinical Social Worker:  Charlene Brooke, LCSW  Date/time:  12/11/2013 05:25 PM  Clinical Social Work is seeking post-discharge placement for this patient at the following level of care:   [place]   (*CSW will update this form in Epic as items are completed)   12/11/2013  Patient/family provided with [facility] Department of Clinical Social Work's list of facilities offering this level of care within the geographic area requested by the patient (or if unable, by the patient's family).  12/11/2013  Patient/family informed of their freedom to choose among providers that offer the needed level of care, that participate in Medicare, Medicaid or managed care program needed by the patient, have an available bed and are willing to accept the patient.  12/11/2013  Patient/family informed of [facility]' ownership interest in [facility], as well as of the fact that they are under no obligation to receive care at this facility.  PASARR submitted to EDS on 12/11/2013 PASARR number received on 12/11/2013  FL2 transmitted to all facilities in geographic area requested by pt/family on  12/11/2013 FL2 transmitted to all facilities within larger geographic area on 12/11/2013  Patient informed that his/her managed care company has contracts with or will negotiate with  certain facilities, including the following:     Patient/family informed of bed offers received:   Patient chooses bed at  Physician recommends and patient chooses bed at    Patient to be transferred to  on   Patient to be transferred to facility by  Patient and family notified of transfer on  Name of family member notified:    The following physician request were entered in Epic:   Additional Comments:   Charlene Brooke, MSW Clinical Social  Worker [phone]

## 2013-12-11 NOTE — Evaluation (Signed)
Clinical/Bedside Swallow Evaluation Patient Details  Name: [name] MRN: [id number] Date of Birth: 1934/07/02  Today's Date: 12/11/2013 Time: 4098-1191 SLP Time Calculation (min): 37 min  Past Medical History:  Past Medical History  Diagnosis Date  . Hyperlipidemia   . PVD (peripheral vascular disease)   . Hypertension   . COPD (chronic obstructive pulmonary disease)   . Colon cancer   . Alzheimer's dementia 2013  . Pneumonia 2014  . Dyspnea   . Type II diabetes mellitus   . Depression   . CHF (congestive heart failure)   . Anemia     HX OF ANEMIA   Past Surgical History:  Past Surgical History  Procedure Laterality Date  . Colon surgery  1996    cancer  . Lumbar disc surgery  2005  . Vascular surgery    . Colon resection  1997    /enc. notes 05/17/2004 (04/28/2013)  . Femoral artery - femoral artery bypass graft      right to left/enc. notes 05/17/2004 (04/28/2013)  . Femoral bypass Right     /enc. notes 05/17/2004 (04/28/2013)  . Fiberoptic bronchoscopy      /enc. notes 03/16/2005 (04/28/2013)  . Cataract extraction w/ intraocular lens implant Left 01/2013  . Cataract extraction Right 02/2013  . Femoral-popliteal bypass graft Right 04/29/2013    Procedure:  FEMORAL-POPLITEAL ARTERY Bypass Graft with intraoperative ultrasound and arteriogarm;  Surgeon: Mal Misty, MD;  Location: [place];  Service: Vascular;  Laterality: Right;  . Intraoperative arteriogram Right 04/29/2013    Procedure: INTRA OPERATIVE ARTERIOGRAM;  Surgeon: Mal Misty, MD;  Location: [place];  Service: Vascular;  Laterality: Right;  . Spine surgery    . Esophagogastroduodenoscopy N/A 09/05/2013    Procedure: ESOPHAGOGASTRODUODENOSCOPY (EGD);  Surgeon: Lear Ng, MD;  Location: [facility] ENDOSCOPY;  Service: Endoscopy;  Laterality: N/A;   HPI:  Patient is a 79 y/o male brought to ER by wife with c/o cough, congestion, SOB, decreased PO intake and AMS. Pt found to have LLL opacity suspect for PNA.    Assessment / Plan / Recommendation Clinical Impression  Pt presents with baseline cough and SOB prior to initiation of PO trials. An immediate throat clear was noted x1 after several large consecutive straw sips, with pt requiring Mod cues from SLP throughout intake due to impulsivity. Small cup sips of thin liquid and Dys 1 and 3 textures were consumed without overt signs of aspiration. Coordination of breath/swallow appeared to be intact, although SLP provided Mod cues for breaks for SOB. Recommend to continue Dys 3 textures and thin liquids with no straws and full supervision for slower rate of intake with rest breaks PRN. SLP to follow for tolerance.    Aspiration Risk  Moderate    Diet Recommendation Dysphagia 3 (Mechanical Soft);Thin liquid   Liquid Administration via: Cup;No straw Medication Administration: Whole meds with puree Supervision: Patient able to self feed;Full supervision/cueing for compensatory strategies Compensations: Slow rate;Small sips/bites Postural Changes and/or Swallow Maneuvers: Seated upright 90 degrees    Other  Recommendations Oral Care Recommendations: Oral care BID   Follow Up Recommendations  Skilled Nursing facility    Frequency and Duration min 2x/week  1 week   Pertinent Vitals/Pain n/a    SLP Swallow Goals     Swallow Study Prior Functional Status       General Date of Onset: 12/09/13 HPI: Patient is a 79 y/o male brought to ER by wife with c/o cough, congestion, SOB, decreased PO  intake and AMS. Pt found to have LLL opacity suspect for PNA. Type of Study: Bedside swallow evaluation Previous Swallow Assessment: none in chart Diet Prior to this Study: Dysphagia 3 (soft);Thin liquids Temperature Spikes Noted: No Respiratory Status: Room air History of Recent Intubation: No Behavior/Cognition: Alert;Cooperative;Pleasant mood;Confused Oral Cavity - Dentition: Dentures, top;Dentures, bottom Self-Feeding Abilities: Able to feed  self Patient Positioning: Upright in bed Baseline Vocal Quality: Clear Volitional Swallow: Able to elicit    Oral/Motor/Sensory Function Overall Oral Motor/Sensory Function: Appears within functional limits for tasks assessed   Ice Chips Ice chips: Not tested   Thin Liquid Thin Liquid: Impaired Presentation: Cup;Self Fed;Straw Pharyngeal  Phase Impairments: Suspected delayed Swallow;Throat Clearing - Immediate    Nectar Thick Nectar Thick Liquid: Not tested   Honey Thick Honey Thick Liquid: Not tested   Puree Puree: Impaired Presentation: Self Fed;Spoon Pharyngeal Phase Impairments: Suspected delayed Swallow   Solid   GO    Solid: Impaired Presentation: Self Fed Pharyngeal Phase Impairments: Suspected delayed Swallow       [name], M.A. CCC-SLP [phone]  [name] 12/11/2013,4:54 PM

## 2013-12-11 NOTE — Progress Notes (Signed)
MD made aware of pt's coughing and struggle with swallowing of lunch. Orders received for a swallow evaluation and diet order change. Also MD made aware of BP 172/92.

## 2013-12-11 NOTE — Progress Notes (Signed)
In to visit with patient for [facility] consult.  He deferred me to talk with his wife who was not in the room.  Called patients home and left a message requesting a call back.  Will follow up with wife on next week.  Of note, [facility] Care Management services does not replace or interfere with any services that are arranged by inpatient case management or social work.  For additional questions or referrals please contact Corliss Blacker BSN RN [facility] Liaison at [phone].

## 2013-12-11 NOTE — Progress Notes (Signed)
Patient Demographics  [name], is a 79 y.o. male, DOB - [birth year], WHQ:[id number]  Admit date - 12/09/2013   Admitting Physician Velvet Bathe, MD  Outpatient Primary MD for the patient is [name], MD  LOS - 2   No chief complaint on file.  brief history of present illness: 79 year old male presents with cough, congestion, shortness of breath, poor appetite, chest x-ray show left lower lung opacity suspicious for pneumonia, a shunt was started on IV Rocephin and azithromycin for community-acquired pneumonia 9/16, remains a febrile.   Subjective:   [name] today has, No headache, No chest pain, No abdominal pain - No Nausea, No new weakness , still complains of cough and productive sputum.  Assessment & Plan    Principal Problem:   CAP (community acquired pneumonia) Active Problems:   HYPERTENSION NEC   Chronic systolic CHF (congestive heart failure)   PNA (pneumonia)  Community-acquired pneumonia - Continue with IV Rocephin and azithromycin, start Mucinex, and flutter valve,  Is complaining of productive sputum, a febrile, no hypoxia. Diabetes mellitus - Patient is off his oral hypoglycemic agent, seems to be controlled with diet, continue to monitor. Hypertension - Acceptable, continue with home. Hyper lipidemia - Continue with statin  Code Status: Full Code.  Family Communication: none at bedside.  Disposition Plan: home in 24 hours   Procedures    Consults     Medications  Scheduled Meds: . aspirin EC  81 mg Oral Daily  . atorvastatin  40 mg Oral q1800  . azithromycin  500 mg Intravenous Q24H  . budesonide-formoterol  2 puff Inhalation BID  . cefTRIAXone (ROCEPHIN)  IV  1 g Intravenous Q24H  . digoxin  0.125 mg Oral Daily  . ferrous sulfate  325 mg Oral Q breakfast  . guaiFENesin  600 mg Oral BID  . heparin  5,000  Units Subcutaneous 3 times per day  . irbesartan  300 mg Oral Daily  . metoprolol tartrate  12.5 mg Oral BID  . nystatin  5 mL Oral QID   Continuous Infusions: . sodium chloride 75 mL/hr at 12/11/13 0522   PRN Meds:.albuterol  DVT Prophylaxis   Heparin - SCDs   Lab Results  Component Value Date   PLT 271 12/11/2013    Antibiotics   Anti-infectives   Start     Dose/Rate Route Frequency Ordered Stop   12/09/13 2000  cefTRIAXone (ROCEPHIN) 1 g in dextrose 5 % 50 mL IVPB     1 g 100 mL/hr over 30 Minutes Intravenous Every 24 hours 12/09/13 1947 12/16/13 1959   12/09/13 2000  azithromycin (ZITHROMAX) 500 mg in dextrose 5 % 250 mL IVPB     500 mg 250 mL/hr over 60 Minutes Intravenous Every 24 hours 12/09/13 1947 12/16/13 1959          Objective:   Filed Vitals:   12/10/13 2106 12/10/13 2132 12/11/13 0501 12/11/13 1010  BP:  148/80 162/89   Pulse:  94 85 94  Temp:  97.9 F (36.6 C) 97.8 F (36.6 C)   TempSrc:  Oral Oral   Resp:  17 20   Height:      Weight:      SpO2: 98% 100% 100%  Wt Readings from Last 3 Encounters:  12/09/13 72.938 kg (160 lb 12.8 oz)  12/09/13 73.936 kg (163 lb)  09/23/13 76.658 kg (169 lb)     Intake/Output Summary (Last 24 hours) at 12/11/13 1047 Last data filed at 12/11/13 1043  Gross per 24 hour  Intake   3485 ml  Output    400 ml  Net   3085 ml     Physical Exam  Awake Alert, Oriented X 3, No new F.N deficits, Normal affect [place].AT,PERRAL Supple Neck,No JVD, No cervical lymphadenopathy appriciated.  Symmetrical Chest wall movement, Good air movement bilaterally, mild rales at bases. RRR,No Gallops,Rubs or new Murmurs, No Parasternal Heave +ve B.Sounds, Abd Soft, No tenderness, No organomegaly appriciated, No rebound - guarding or rigidity. No Cyanosis, Clubbing or edema, No new Rash or bruise     Data Review   Micro Results Recent Results (from the past 240 hour(s))  CULTURE, BLOOD (ROUTINE X 2)     Status: None    Collection Time    12/09/13  8:36 PM      Result Value Ref Range Status   Specimen Description BLOOD LEFT ARM   Final   Special Requests BOTTLES DRAWN AEROBIC ONLY 10CC   Final   Culture  Setup Time     Final   Value: 12/10/2013 00:55     Performed at [facility]   Culture     Final   Value:        BLOOD CULTURE RECEIVED NO GROWTH TO DATE CULTURE WILL BE HELD FOR 5 DAYS BEFORE ISSUING A FINAL NEGATIVE REPORT     Performed at [facility]   Report Status PENDING   Incomplete  CULTURE, BLOOD (ROUTINE X 2)     Status: None   Collection Time    12/09/13  8:53 PM      Result Value Ref Range Status   Specimen Description BLOOD LEFT HAND   Final   Special Requests BOTTLES DRAWN AEROBIC ONLY 5CC   Final   Culture  Setup Time     Final   Value: 12/10/2013 00:55     Performed at [facility]   Culture     Final   Value:        BLOOD CULTURE RECEIVED NO GROWTH TO DATE CULTURE WILL BE HELD FOR 5 DAYS BEFORE ISSUING A FINAL NEGATIVE REPORT     Performed at [facility]   Report Status PENDING   Incomplete    Radiology Reports Dg Chest 1 View  12/09/2013   CLINICAL DATA:  Shortness of breath.  EXAM: CHEST - 1 VIEW  COMPARISON:  Chest radiograph performed earlier today at 3:44 p.m.  FINDINGS: There is a persistent small left pleural effusion, with minimal left basilar opacity that may reflect atelectasis or mild pneumonia. The right lung appears clear. No pneumothorax is seen  The cardiomediastinal silhouette is normal in size. No acute osseous abnormalities are identified.  IMPRESSION: Persistent small left pleural effusion. Minimal left basilar airspace opacity may reflect atelectasis or mild pneumonia.   Electronically Signed   By: Garald Balding M.D.   On: 12/09/2013 22:02   Dg Chest 2 View  12/09/2013   CLINICAL DATA:  Irregular heart rhythm.  Cough.  EXAM: CHEST  2 VIEW  COMPARISON:  10/27/2013 and chest CT dated 09/01/2013  FINDINGS: Heart size and pulmonary  vascularity are normal. There is chronic pleural thickening at the right lung base. There is a new  small left effusion. There is chronic accentuation of the interstitial markings at both bases. No acute osseous abnormality.  IMPRESSION: New small left effusion. Chronic bibasilar interstitial lung disease with scarring at the right base.   Electronically Signed   By: Rozetta Nunnery M.D.   On: 12/09/2013 20:23    CBC  Recent Labs Lab 12/09/13 1641 12/11/13 0628  WBC 10.5* 10.0  HGB 13.1* 13.0  HCT 44.8 40.9  PLT  --  271  MCV 85.3 79.1  MCH 24.9* 25.1*  MCHC 29.1* 31.8  RDW  --  19.5*    Chemistries   Recent Labs Lab 12/09/13 2036 12/11/13 0628  NA 139 139  K 3.9 4.2  CL 98 101  CO2 26 19  GLUCOSE 105* 125*  BUN 14 16  CREATININE 1.03 0.87  CALCIUM 9.8 8.7   ------------------------------------------------------------------------------------------------------------------ estimated creatinine clearance is 73.3 ml/min (by C-G formula based on Cr of 0.87). ------------------------------------------------------------------------------------------------------------------ No results found for this basename: HGBA1C,  in the last 72 hours ------------------------------------------------------------------------------------------------------------------ No results found for this basename: CHOL, HDL, LDLCALC, TRIG, CHOLHDL, LDLDIRECT,  in the last 72 hours ------------------------------------------------------------------------------------------------------------------ No results found for this basename: TSH, T4TOTAL, FREET3, T3FREE, THYROIDAB,  in the last 72 hours ------------------------------------------------------------------------------------------------------------------ No results found for this basename: VITAMINB12, FOLATE, FERRITIN, TIBC, IRON, RETICCTPCT,  in the last 72 hours  Coagulation profile No results found for this basename: INR, PROTIME,  in the last 168  hours  No results found for this basename: DDIMER,  in the last 72 hours  Cardiac Enzymes No results found for this basename: CK, CKMB, TROPONINI, MYOGLOBIN,  in the last 168 hours ------------------------------------------------------------------------------------------------------------------ No components found with this basename: POCBNP,      Time Spent in minutes   35 Minutes.   [facility], Emmory Solivan M.D on 12/11/2013 at 10:47 AM  Between 7am to 7pm - Pager - [phone]  After 7pm go to [email or web address] - password [email or web address]  And look for the night coverage person covering for me after hours  [facility] Office  [phone]   **Disclaimer: This note may have been dictated with voice recognition software. Similar sounding words can inadvertently be transcribed and this note may contain transcription errors which may not have been corrected upon publication of note.**

## 2013-12-11 NOTE — Evaluation (Signed)
Physical Therapy Evaluation Patient Details Name: [name] MRN: [id number] DOB: 11-24-1934 Today's Date: 12/11/2013   History of Present Illness  Patient is a 79 y/o male brought to ER by wife with c/o cough, congestion, SOB, decreased PO intake and AMS. Pt found to have LLL opacity suspect for PNA.   Clinical Impression  Patient presents with functional limitations due deficits listed in PT problem list (see below). Pt with baseline cognitive deficits secondary to dementia. Pt with difficulty swallowing- RN notified. Pt with balance deficits, impaired safety awareness and grunting- which appears to be dyspnea however pt unable to state due to cognition. Lengthy discussion with wife about disposition and concerns about her husband. Pt would benefit from acute PT and follow up ST SNF to improve transfers, gait and balance so pt can maximize independence, ease burden of care and return to PLOF. Wife agreeable to plan as she cannot provide the necessary care at home due to current mobility level.    Follow Up Recommendations SNF;Supervision/Assistance - 24 hour    Equipment Recommendations  None recommended by PT    Recommendations for Other Services Speech consult     Precautions / Restrictions Precautions Precautions: Fall Restrictions Weight Bearing Restrictions: No      Mobility  Bed Mobility Overal bed mobility: Needs Assistance Bed Mobility: Sit to Supine       Sit to supine: Min guard   General bed mobility comments: HOB flat, no rails. Min guard and cues for technique.  Transfers Overall transfer level: Needs assistance Equipment used: Rolling walker (2 wheeled) Transfers: Sit to/from Stand Sit to Stand: Min assist;Min guard         General transfer comment: Min A to rise and steady from toilet. Total A for peri care. Able to stand from recliner with Min guard.  Ambulation/Gait Ambulation/Gait assistance: Min assist Ambulation Distance (Feet): 22 Feet  (+16' with seated rest break for toileting.) Assistive device: Rolling walker (2 wheeled) Gait Pattern/deviations: Step-through pattern;Shuffle;Trunk flexed Gait velocity: decreased   General Gait Details: Verbal and manual cues for RW proximity and safety. Requires constant cues to stay within RW and for forward momentum. Assist with direction. Unsafe and increased speed noted when ambulating towards bathroom.  Stairs            Wheelchair Mobility    Modified Rankin (Stroke Patients Only)       Balance Overall balance assessment: Needs assistance   Sitting balance-Leahy Scale: Fair       Standing balance-Leahy Scale: Poor Standing balance comment: Requires UE support for dynamic standing activities - leaning on sink when washing hands, cues to upright. UE support on RW during peri care. Pt attempted to perform pericare independently but feces all over hands.                             Pertinent Vitals/Pain Pain Assessment: No/denies pain    Home Living Family/patient expects to be discharged to:: Skilled nursing facility                 Additional Comments: Per wife. pt is (I) household ambulator and uses RW for community ambulation. Pt has an aide come in daily to assist with ADLs - dressing/bathing. Pt with baseline dementia.     Prior Function Level of Independence: Needs assistance               Hand Dominance  Extremity/Trunk Assessment   Upper Extremity Assessment: Overall WFL for tasks assessed           Lower Extremity Assessment: Generalized weakness         Communication      Cognition Arousal/Alertness: Awake/alert Behavior During Therapy: WFL for tasks assessed/performed Overall Cognitive Status: History of cognitive impairments - at baseline Area of Impairment: Orientation;Following commands;Safety/judgement;Problem solving Orientation Level: Disoriented to;Place;Time;Situation     Following Commands:  Follows one step commands with increased time Safety/Judgement: Decreased awareness of safety;Decreased awareness of deficits   Problem Solving: Difficulty sequencing;Requires verbal cues      General Comments General comments (skin integrity, edema, etc.): Upon entering room, pt's wife reports pt having difficulty swallowing food. Observed pt chewing extra time on french fries and needed cues to swallow. Upon swallowing, gagging noted. Had pt spit out food and notified RN. Requesting speech therapy and swallowing eval. Groaning and grunting noticed throughout eval.     Exercises        Assessment/Plan    PT Assessment Patient needs continued PT services  PT Diagnosis Generalized weakness;Difficulty walking   PT Problem List Decreased strength;Cardiopulmonary status limiting activity;Decreased cognition;Decreased activity tolerance;Decreased knowledge of use of DME;Decreased safety awareness;Decreased balance;Decreased mobility;Decreased knowledge of precautions  PT Treatment Interventions DME instruction;Balance training;Gait training;Patient/family education;Functional mobility training;Therapeutic exercise;Therapeutic activities;Cognitive remediation   PT Goals (Current goals can be found in the Care Plan section) Acute Rehab PT Goals Patient Stated Goal: Pt unable to state goals but wife wants pt to return to PLOF. PT Goal Formulation: With patient/family Time For Goal Achievement: 12/25/13 Potential to Achieve Goals: Good    Frequency Min 3X/week   Barriers to discharge        Co-evaluation               End of Session Equipment Utilized During Treatment: Gait belt Activity Tolerance: Patient limited by fatigue Patient left: in bed;with call bell/phone within reach;with bed alarm set;with nursing/sitter in room Nurse Communication: Mobility status         Time: 7209-4709 PT Time Calculation (min): 57 min   Charges:   PT Evaluation $Initial PT Evaluation  Tier I: 1 Procedure PT Treatments $Gait Training: 8-22 mins $Therapeutic Activity: 8-22 mins   PT G CodesCandy [name] A 12/11/2013, 2:56 PM [name], [place], DPT [phone]

## 2013-12-11 NOTE — Care Management Note (Signed)
    Page 1 of 1   12/14/2013     5:14:18 PM CARE MANAGEMENT NOTE 12/14/2013  Patient:  [name], [name]   Account Number:  [id number]  Date Initiated:  12/11/2013  Documentation initiated by:  Tomi Bamberger  Subjective/Objective Assessment:   dx dehydration , fever, pna, dementia  admit- lives with spouse.     Action/Plan:   pt eval- recd snf- wife in agreement.   Anticipated DC Date:  12/14/2013   Anticipated DC Plan:  SKILLED NURSING FACILITY  In-house referral  Clinical Social Worker      DC Planning Services  CM consult      Choice offered to / List presented to:             Status of service:  Completed, signed off Medicare Important Message given?  YES (If response is "NO", the following Medicare IM given date fields will be blank) Date Medicare IM given:  12/11/2013 Medicare IM given by:  Tomi Bamberger Date Additional Medicare IM given:  12/14/2013 Additional Medicare IM given by:  Tomi Bamberger  Discharge Disposition:  [place]  Per UR Regulation:  Reviewed for med. necessity/level of care/duration of stay  If discussed at [place] of Stay Meetings, dates discussed:    Comments:  12/11/13 [place], BSN [phone] per physical therapy recs snf, CSW referral,  Doris notified. Patient has dementia, wife states patient needs to go to snf.

## 2013-12-12 DIAGNOSIS — M7989 Other specified soft tissue disorders: Secondary | ICD-10-CM

## 2013-12-12 DIAGNOSIS — I82409 Acute embolism and thrombosis of unspecified deep veins of unspecified lower extremity: Secondary | ICD-10-CM

## 2013-12-12 LAB — GLUCOSE, CAPILLARY
GLUCOSE-CAPILLARY: 161 mg/dL — AB (ref 70–99)
GLUCOSE-CAPILLARY: 165 mg/dL — AB (ref 70–99)
Glucose-Capillary: 128 mg/dL — ABNORMAL HIGH (ref 70–99)
Glucose-Capillary: 153 mg/dL — ABNORMAL HIGH (ref 70–99)

## 2013-12-12 MED ORDER — AMLODIPINE BESYLATE 10 MG PO TABS
10.0000 mg | ORAL_TABLET | Freq: Every day | ORAL | Status: DC
  Administered 2013-12-12 – 2013-12-14 (×3): 10 mg via ORAL
  Filled 2013-12-12 (×3): qty 1

## 2013-12-12 MED ORDER — RIVAROXABAN 15 MG PO TABS
15.0000 mg | ORAL_TABLET | Freq: Two times a day (BID) | ORAL | Status: DC
  Administered 2013-12-12 – 2013-12-14 (×5): 15 mg via ORAL
  Filled 2013-12-12 (×6): qty 1

## 2013-12-12 MED ORDER — RIVAROXABAN 20 MG PO TABS: 20.0000 mg | ORAL_TABLET | Freq: Every day | ORAL | Status: DC

## 2013-12-12 MED ORDER — RIVAROXABAN (XARELTO) EDUCATION KIT FOR DVT/PE PATIENTS
PACK | Freq: Once | Status: AC
  Administered 2013-12-12: 1
  Filled 2013-12-12: qty 1

## 2013-12-12 NOTE — Clinical Social Work Note (Signed)
CSW continues to follow for d/c planning needs. CSW contacted patient's wife and provided her with current bed offer: [facility]. Per wife, she does not know about the facility, but if it is the only one offering a bed and patient is ready per MD, she will accept offer with [facility]. CSW made MD and RN, patient PASARR still in review. CSW to continue to follow for d/c planning needs.  [place], [place] Weekend Clinical Social Worker [phone]

## 2013-12-12 NOTE — Progress Notes (Signed)
Patient Demographics  [name], is a 79 y.o. male, DOB - [birth year], KMM:[id number]  Admit date - 12/09/2013   Admitting Physician Velvet Bathe, MD  Outpatient Primary MD for the patient is [name], MD  LOS - 3   No chief complaint on file.  brief history of present illness: 79 year old male presents with cough, congestion, shortness of breath, poor appetite, chest x-ray show left lower lung opacity suspicious for pneumonia, a shunt was started on IV Rocephin and azithromycin for community-acquired pneumonia 9/16, remains a febrile. Patient was noticed to have left lower extremity edema and physical exam 9/19, preliminary report of the venous Doppler showing acute DVT femoral vein, patient was started on Xarelto.   Subjective:   Dushawn Pusey today has, No headache, No chest pain, No abdominal pain - No Nausea, No new weakness , still complains of cough , producing more sputum with assistance of respiratory therapy.  Assessment & Plan    Principal Problem:   CAP (community acquired pneumonia) Active Problems:   HYPERTENSION NEC   Chronic systolic CHF (congestive heart failure)   PNA (pneumonia)  Community-acquired pneumonia - Continue with IV Rocephin and azithromycin, start Mucinex, and flutter valve,  Is complaining of productive sputum, a febrile, no hypoxia. -Improved phlegm secretions with flutter valve and respiratory therapy.  Acute DVT -Despite being on subcutaneous heparin for DVT prophylaxis, we'll start on Xarelto.  Diabetes mellitus - Patient is off his oral hypoglycemic agent, seems to be controlled with diet, continue to monitor.  Hypertension - Uncontrolled, will start on Norvasc.  Hyper lipidemia - Continue with statin  Code Status: Full Code.  Family Communication: Discussed with wife and daughter.  Disposition Plan:  Subacute rehabilitation in 24-48 hours.   Procedures  Venous Doppler  Consults   PT count  Medications  Scheduled Meds: . amLODipine  10 mg Oral Daily  . antiseptic oral rinse  7 mL Mouth Rinse BID  . aspirin EC  81 mg Oral Daily  . atorvastatin  40 mg Oral q1800  . azithromycin  500 mg Intravenous Q24H  . budesonide-formoterol  2 puff Inhalation BID  . cefTRIAXone (ROCEPHIN)  IV  1 g Intravenous Q24H  . digoxin  0.125 mg Oral Daily  . ferrous sulfate  325 mg Oral Q breakfast  . guaiFENesin  600 mg Oral BID  . irbesartan  300 mg Oral Daily  . metoprolol tartrate  12.5 mg Oral BID  . nystatin  5 mL Oral QID   Continuous Infusions:   PRN Meds:.albuterol  DVT Prophylaxis   Heparin - SCDs   Lab Results  Component Value Date   PLT 271 12/11/2013    Antibiotics   Anti-infectives   Start     Dose/Rate Route Frequency Ordered Stop   12/09/13 2000  cefTRIAXone (ROCEPHIN) 1 g in dextrose 5 % 50 mL IVPB     1 g 100 mL/hr over 30 Minutes Intravenous Every 24 hours 12/09/13 1947 12/16/13 1959   12/09/13 2000  azithromycin (ZITHROMAX) 500 mg in dextrose 5 % 250 mL IVPB     500 mg 250 mL/hr over 60 Minutes Intravenous Every 24 hours 12/09/13 1947 12/16/13 1959          Objective:  Filed Vitals:   12/11/13 2146 12/12/13 0605 12/12/13 0831 12/12/13 1021  BP: 160/88 162/80  179/87  Pulse: 85 80  77  Temp: 97.8 F (36.6 C) 97.6 F (36.4 C)    TempSrc: Oral Oral    Resp: 20 20    Height:      Weight:      SpO2: 100% 100% 100%     Wt Readings from Last 3 Encounters:  12/09/13 72.938 kg (160 lb 12.8 oz)  12/09/13 73.936 kg (163 lb)  09/23/13 76.658 kg (169 lb)     Intake/Output Summary (Last 24 hours) at 12/12/13 1323 Last data filed at 12/12/13 1225  Gross per 24 hour  Intake 1033.75 ml  Output   2650 ml  Net -1616.25 ml     Physical Exam  Awake Alert, Oriented X 3, No new F.N deficits, Normal affect [place].AT,PERRAL Supple Neck,No JVD, No cervical  lymphadenopathy appriciated.  Symmetrical Chest wall movement, Good air movement bilaterally, mild rales at bases. RRR,No Gallops,Rubs or new Murmurs, No Parasternal Heave +ve B.Sounds, Abd Soft, No tenderness, No organomegaly appriciated, No rebound - guarding or rigidity. No Cyanosis, Clubbing, No new Rash or bruise  , has +1 left lower extremity edema.   Data Review   Micro Results Recent Results (from the past 240 hour(s))  CULTURE, BLOOD (ROUTINE X 2)     Status: None   Collection Time    12/09/13  8:36 PM      Result Value Ref Range Status   Specimen Description BLOOD LEFT ARM   Final   Special Requests BOTTLES DRAWN AEROBIC ONLY 10CC   Final   Culture  Setup Time     Final   Value: 12/10/2013 00:55     Performed at [facility]   Culture     Final   Value:        BLOOD CULTURE RECEIVED NO GROWTH TO DATE CULTURE WILL BE HELD FOR 5 DAYS BEFORE ISSUING A FINAL NEGATIVE REPORT     Performed at [facility]   Report Status PENDING   Incomplete  CULTURE, BLOOD (ROUTINE X 2)     Status: None   Collection Time    12/09/13  8:53 PM      Result Value Ref Range Status   Specimen Description BLOOD LEFT HAND   Final   Special Requests BOTTLES DRAWN AEROBIC ONLY 5CC   Final   Culture  Setup Time     Final   Value: 12/10/2013 00:55     Performed at [facility]   Culture     Final   Value:        BLOOD CULTURE RECEIVED NO GROWTH TO DATE CULTURE WILL BE HELD FOR 5 DAYS BEFORE ISSUING A FINAL NEGATIVE REPORT     Performed at [facility]   Report Status PENDING   Incomplete    Radiology Reports No results found.  CBC  Recent Labs Lab 12/09/13 1641 12/11/13 0628  WBC 10.5* 10.0  HGB 13.1* 13.0  HCT 44.8 40.9  PLT  --  271  MCV 85.3 79.1  MCH 24.9* 25.1*  MCHC 29.1* 31.8  RDW  --  19.5*    Chemistries   Recent Labs Lab 12/09/13 2036 12/11/13 0628  NA 139 139  K 3.9 4.2  CL 98 101  CO2 26 19  GLUCOSE 105* 125*  BUN 14 16    CREATININE 1.03 0.87  CALCIUM 9.8 8.7   ------------------------------------------------------------------------------------------------------------------  estimated creatinine clearance is 73.3 ml/min (by C-G formula based on Cr of 0.87). ------------------------------------------------------------------------------------------------------------------ No results found for this basename: HGBA1C,  in the last 72 hours ------------------------------------------------------------------------------------------------------------------ No results found for this basename: CHOL, HDL, LDLCALC, TRIG, CHOLHDL, LDLDIRECT,  in the last 72 hours ------------------------------------------------------------------------------------------------------------------ No results found for this basename: TSH, T4TOTAL, FREET3, T3FREE, THYROIDAB,  in the last 72 hours ------------------------------------------------------------------------------------------------------------------ No results found for this basename: VITAMINB12, FOLATE, FERRITIN, TIBC, IRON, RETICCTPCT,  in the last 72 hours  Coagulation profile No results found for this basename: INR, PROTIME,  in the last 168 hours  No results found for this basename: DDIMER,  in the last 72 hours  Cardiac Enzymes No results found for this basename: CK, CKMB, TROPONINI, MYOGLOBIN,  in the last 168 hours ------------------------------------------------------------------------------------------------------------------ No components found with this basename: POCBNP,      Time Spent in minutes   35 Minutes.   [facility], DAWOOD M.D on 12/12/2013 at 1:23 PM  Between 7am to 7pm - Pager - [phone]  After 7pm go to [email or web address] - password [email or web address]  And look for the night coverage person covering for me after hours  [facility] Office  [phone]   **Disclaimer: This note may have been dictated with voice recognition software. Similar sounding  words can inadvertently be transcribed and this note may contain transcription errors which may not have been corrected upon publication of note.**

## 2013-12-12 NOTE — Progress Notes (Addendum)
VASCULAR LAB PRELIMINARY  PRELIMINARY  PRELIMINARY  PRELIMINARY  Left lower extremity venous Doppler completed.    Preliminary report:  There is acute DVT noted in the left common femoral and femoral veins.  All other veins appear thrombus free.  There is no propagation noted to the right lower extremity.   Quintyn Dombek, RVT 12/12/2013, 10:58 AM

## 2013-12-12 NOTE — Progress Notes (Signed)
ANTICOAGULATION CONSULT NOTE - Initial Consult  Pharmacy Consult for Xarelto Indication: DVT  No Known Allergies  Patient Measurements: Height: 5\' 11"  (180.3 cm) Weight: 160 lb 12.8 oz (72.938 kg) IBW/kg (Calculated) : 75.3  Vital Signs: Temp: 97.6 F (36.4 C) (09/19 0605) Temp src: Oral (09/19 0605) BP: 179/87 mmHg (09/19 1021) Pulse Rate: 77 (09/19 1021)  Labs:  Recent Labs  12/09/13 1641 12/09/13 2036 12/11/13 0628  HGB 13.1*  --  13.0  HCT 44.8  --  40.9  PLT  --   --  271  CREATININE  --  1.03 0.87    Estimated Creatinine Clearance: 73.3 ml/min (by C-G formula based on Cr of 0.87).   Medical History: Past Medical History  Diagnosis Date  . Hyperlipidemia   . PVD (peripheral vascular disease)   . Hypertension   . COPD (chronic obstructive pulmonary disease)   . Colon cancer   . Alzheimer's dementia 2013  . Pneumonia 2014  . Dyspnea   . Type II diabetes mellitus   . Depression   . CHF (congestive heart failure)   . Anemia     HX OF ANEMIA    Medications:  Scheduled:  . amLODipine  10 mg Oral Daily  . antiseptic oral rinse  7 mL Mouth Rinse BID  . aspirin EC  81 mg Oral Daily  . atorvastatin  40 mg Oral q1800  . azithromycin  500 mg Intravenous Q24H  . budesonide-formoterol  2 puff Inhalation BID  . cefTRIAXone (ROCEPHIN)  IV  1 g Intravenous Q24H  . digoxin  0.125 mg Oral Daily  . ferrous sulfate  325 mg Oral Q breakfast  . guaiFENesin  600 mg Oral BID  . irbesartan  300 mg Oral Daily  . metoprolol tartrate  12.5 mg Oral BID  . nystatin  5 mL Oral QID   Assessment: [name] is a 44 yom who presented to [facility] for cough, congestion, SOB, decreased PO intake, decreased UOP, and altered mental status.  Patient was noted to have left lower extremity edema.  Preliminary report of the venous Doppler showing acute DVT femoral vein, therefore pharmacy was consulted to initiate and dose xarelto.  No signs or symptoms of major bleeding.  H/H wnl, plt  wnl.  Renal function appears stable, Scr 0.87, CrCl > 30 mL/min.    Goal of Therapy:  Resolution of DVT Monitor platelets by anticoagulation protocol: Yes   Plan:  - Xarelto 15 mg by mouth twice daily x 21 days, then 20 mg once daily - Monitor for s/sx of bleeding - F/U CBC - Will need Xarelto education

## 2013-12-13 ENCOUNTER — Inpatient Hospital Stay (HOSPITAL_COMMUNITY): Payer: Medicare HMO

## 2013-12-13 LAB — GLUCOSE, CAPILLARY
GLUCOSE-CAPILLARY: 154 mg/dL — AB (ref 70–99)
GLUCOSE-CAPILLARY: 162 mg/dL — AB (ref 70–99)
GLUCOSE-CAPILLARY: 132 mg/dL — AB (ref 70–99)
Glucose-Capillary: 125 mg/dL — ABNORMAL HIGH (ref 70–99)

## 2013-12-13 LAB — CBC
HEMATOCRIT: 41.6 % (ref 39.0–52.0)
HEMOGLOBIN: 12.9 g/dL — AB (ref 13.0–17.0)
MCH: 25 pg — ABNORMAL LOW (ref 26.0–34.0)
MCHC: 31 g/dL (ref 30.0–36.0)
MCV: 80.5 fL (ref 78.0–100.0)
Platelets: 300 10*3/uL (ref 150–400)
RBC: 5.17 MIL/uL (ref 4.22–5.81)
RDW: 19.2 % — ABNORMAL HIGH (ref 11.5–15.5)
WBC: 8 10*3/uL (ref 4.0–10.5)

## 2013-12-13 LAB — BASIC METABOLIC PANEL
Anion gap: 13 (ref 5–15)
BUN: 12 mg/dL (ref 6–23)
CO2: 24 meq/L (ref 19–32)
Calcium: 8.8 mg/dL (ref 8.4–10.5)
Chloride: 106 mEq/L (ref 96–112)
Creatinine, Ser: 1 mg/dL (ref 0.50–1.35)
GFR calc Af Amer: 82 mL/min — ABNORMAL LOW (ref >90)
GFR calc non Af Amer: 70 mL/min — ABNORMAL LOW (ref >90)
GLUCOSE: 113 mg/dL — AB (ref 70–99)
POTASSIUM: 3.9 meq/L (ref 3.7–5.3)
Sodium: 143 mEq/L (ref 137–147)

## 2013-12-13 MED ORDER — AZITHROMYCIN 500 MG PO TABS
500.0000 mg | ORAL_TABLET | Freq: Every day | ORAL | Status: DC
  Administered 2013-12-13: 500 mg via ORAL
  Filled 2013-12-13 (×2): qty 1

## 2013-12-13 NOTE — Progress Notes (Signed)
This patient is receiving Azithromycin 500 mg IV (D#5). Based on criteria approved by the Pharmacy and Therapeutics Committee, this medication is being converted to the equivalent oral dose form. These criteria include:   . The patient is eating (either orally or per tube) and/or has been taking other orally administered medications for at least 24 hours.  . This patient has no evidence of active gastrointestinal bleeding or impaired GI absorption (gastrectomy, short bowel, patient on TNA or NPO).   If you have questions about this conversion, please contact the pharmacy department.  Hassie Bruce, Pharm. D. Clinical Pharmacy Resident Pager: [phone] Ph: [phone] 12/13/2013 11:43 AM

## 2013-12-13 NOTE — Progress Notes (Signed)
Patient Demographics  [name], is a 79 y.o. male, DOB - 12-27-1934, MBW:[id number]  Admit date - 12/09/2013   Admitting Physician Velvet Bathe, MD  Outpatient Primary MD for the patient is [name], MD  LOS - 4   No chief complaint on file.  brief history of present illness: 79 year old male presents with cough, congestion, shortness of breath, poor appetite, chest x-ray show left lower lung opacity suspicious for pneumonia, a shunt was started on IV Rocephin and azithromycin for community-acquired pneumonia 9/16, remains a febrile. Patient was noticed to have left lower extremity edema and physical exam 9/19, preliminary report of the venous Doppler showing acute DVT femoral vein, patient was started on Xarelto.   Subjective:   [name] today has, No headache, No chest pain, No abdominal pain - No Nausea, No new weakness , cough much improved.  Assessment & Plan    Principal Problem:   CAP (community acquired pneumonia) Active Problems:   HYPERTENSION NEC   Chronic systolic CHF (congestive heart failure)   PNA (pneumonia)   Acute DVT (deep venous thrombosis)  Community-acquired pneumonia - Continue with IV Rocephin and azithromycin, start Mucinex, and flutter valve,  a febrile, no hypoxia. -Improved phlegm secretions with flutter valve and respiratory therapy.  Acute DVT -Despite being on subcutaneous heparin for DVT prophylaxis, started on Xarelto.  Diabetes mellitus - Patient is off his oral hypoglycemic agent, seems to be controlled with diet, continue to monitor.  Hypertension - Better controlled once Norvasc was started.  Hyper lipidemia - Continue with statin  Code Status: Full Code.  Family Communication: Discussed with son at bedside.  Disposition Plan: Ready for discharge, awaiting bed availability.   Procedures    Venous Doppler  Consults   PT count  Medications  Scheduled Meds: . amLODipine  10 mg Oral Daily  . antiseptic oral rinse  7 mL Mouth Rinse BID  . aspirin EC  81 mg Oral Daily  . atorvastatin  40 mg Oral q1800  . azithromycin  500 mg Intravenous Q24H  . budesonide-formoterol  2 puff Inhalation BID  . cefTRIAXone (ROCEPHIN)  IV  1 g Intravenous Q24H  . digoxin  0.125 mg Oral Daily  . ferrous sulfate  325 mg Oral Q breakfast  . guaiFENesin  600 mg Oral BID  . irbesartan  300 mg Oral Daily  . metoprolol tartrate  12.5 mg Oral BID  . nystatin  5 mL Oral QID  . Rivaroxaban  15 mg Oral BID WC  . [START ON 01/03/2014] rivaroxaban  20 mg Oral Q supper   Continuous Infusions:   PRN Meds:.albuterol  DVT Prophylaxis   Xarelto.  Lab Results  Component Value Date   PLT 300 12/13/2013    Antibiotics   Anti-infectives   Start     Dose/Rate Route Frequency Ordered Stop   12/09/13 2000  cefTRIAXone (ROCEPHIN) 1 g in dextrose 5 % 50 mL IVPB     1 g 100 mL/hr over 30 Minutes Intravenous Every 24 hours 12/09/13 1947 12/16/13 1959   12/09/13 2000  azithromycin (ZITHROMAX) 500 mg in dextrose 5 % 250 mL IVPB     500 mg 250 mL/hr over 60 Minutes Intravenous Every 24 hours 12/09/13 1947 12/16/13 1959  Objective:   Filed Vitals:   12/12/13 2116 12/12/13 2123 12/13/13 0553 12/13/13 0719  BP: 145/96  142/94   Pulse: 78  86   Temp: 97.7 F (36.5 C)  98.1 F (36.7 C)   TempSrc: Oral  Oral   Resp: 18  18   Height:      Weight:      SpO2: 100% 100% 97% 98%    Wt Readings from Last 3 Encounters:  12/09/13 72.938 kg (160 lb 12.8 oz)  12/09/13 73.936 kg (163 lb)  09/23/13 76.658 kg (169 lb)     Intake/Output Summary (Last 24 hours) at 12/13/13 0914 Last data filed at 12/13/13 0554  Gross per 24 hour  Intake    720 ml  Output   1700 ml  Net   -980 ml     Physical Exam  Awake Alert, Oriented X 3, No new F.N deficits, Normal affect [place].AT,PERRAL Supple Neck,No  JVD, No cervical lymphadenopathy appriciated.  Symmetrical Chest wall movement, Good air movement bilaterally, mild rales at bases. RRR,No Gallops,Rubs or new Murmurs, No Parasternal Heave +ve B.Sounds, Abd Soft, No tenderness, No organomegaly appriciated, No rebound - guarding or rigidity. No Cyanosis, Clubbing, No new Rash or bruise  , has +1 left lower extremity edema.   Data Review   Micro Results Recent Results (from the past 240 hour(s))  CULTURE, BLOOD (ROUTINE X 2)     Status: None   Collection Time    12/09/13  8:36 PM      Result Value Ref Range Status   Specimen Description BLOOD LEFT ARM   Final   Special Requests BOTTLES DRAWN AEROBIC ONLY 10CC   Final   Culture  Setup Time     Final   Value: 12/10/2013 00:55     Performed at [facility]   Culture     Final   Value:        BLOOD CULTURE RECEIVED NO GROWTH TO DATE CULTURE WILL BE HELD FOR 5 DAYS BEFORE ISSUING A FINAL NEGATIVE REPORT     Performed at [facility]   Report Status PENDING   Incomplete  CULTURE, BLOOD (ROUTINE X 2)     Status: None   Collection Time    12/09/13  8:53 PM      Result Value Ref Range Status   Specimen Description BLOOD LEFT HAND   Final   Special Requests BOTTLES DRAWN AEROBIC ONLY 5CC   Final   Culture  Setup Time     Final   Value: 12/10/2013 00:55     Performed at [facility]   Culture     Final   Value:        BLOOD CULTURE RECEIVED NO GROWTH TO DATE CULTURE WILL BE HELD FOR 5 DAYS BEFORE ISSUING A FINAL NEGATIVE REPORT     Performed at [facility]   Report Status PENDING   Incomplete    Radiology Reports No results found.  CBC  Recent Labs Lab 12/09/13 1641 12/11/13 0628 12/13/13 0545  WBC 10.5* 10.0 8.0  HGB 13.1* 13.0 12.9*  HCT 44.8 40.9 41.6  PLT  --  271 300  MCV 85.3 79.1 80.5  MCH 24.9* 25.1* 25.0*  MCHC 29.1* 31.8 31.0  RDW  --  19.5* 19.2*    Chemistries   Recent Labs Lab 12/09/13 2036 12/11/13 0628 12/13/13 0545    NA 139 139 143  K 3.9 4.2 3.9  CL 98 101 106  CO2 26 19 24   GLUCOSE 105* 125* 113*  BUN 14 16 12   CREATININE 1.03 0.87 1.00  CALCIUM 9.8 8.7 8.8   ------------------------------------------------------------------------------------------------------------------ estimated creatinine clearance is 63.8 ml/min (by C-G formula based on Cr of 1). ------------------------------------------------------------------------------------------------------------------ No results found for this basename: HGBA1C,  in the last 72 hours ------------------------------------------------------------------------------------------------------------------ No results found for this basename: CHOL, HDL, LDLCALC, TRIG, CHOLHDL, LDLDIRECT,  in the last 72 hours ------------------------------------------------------------------------------------------------------------------ No results found for this basename: TSH, T4TOTAL, FREET3, T3FREE, THYROIDAB,  in the last 72 hours ------------------------------------------------------------------------------------------------------------------ No results found for this basename: VITAMINB12, FOLATE, FERRITIN, TIBC, IRON, RETICCTPCT,  in the last 72 hours  Coagulation profile No results found for this basename: INR, PROTIME,  in the last 168 hours  No results found for this basename: DDIMER,  in the last 72 hours  Cardiac Enzymes No results found for this basename: CK, CKMB, TROPONINI, MYOGLOBIN,  in the last 168 hours ------------------------------------------------------------------------------------------------------------------ No components found with this basename: POCBNP,      Time Spent in minutes   30 Minutes.   [facility], Karlie Aung M.D on 12/13/2013 at 9:14 AM  Between 7am to 7pm - Pager - [phone]  After 7pm go to [email or web address] - password [email or web address]  And look for the night coverage person covering for me after hours  [facility] Office   [phone]   **Disclaimer: This note may have been dictated with voice recognition software. Similar sounding words can inadvertently be transcribed and this note may contain transcription errors which may not have been corrected upon publication of note.**

## 2013-12-13 NOTE — Discharge Instructions (Addendum)
Follow with Primary MD or facility M.D. in 3 days   Get CBC, CMP, 2 view Chest X ray checked in 3 days, to follow on left lower lung pneumonia, and to check CBC as on Xarelto.  Please have the facility M.D. will follow on Xarelto dosing, and to adjust on 01/03/2014 to 20 mg oral once daily.  Activity: As per PT/OT recommendation   Disposition SNF   Diet: Heart Healthy /carb modified dysphagia 3 with thin liquid, with feeding assistance and aspiration precautions .  For Heart failure patients - Check your Weight same time everyday, if you gain over 2 pounds, or you develop in leg swelling, experience more shortness of breath or chest pain, call your Primary MD immediately. Follow Cardiac Low Salt Diet and 1.8 lit/day fluid restriction.   On your next visit with her primary care physician please Get Medicines reviewed and adjusted.  Please request your Prim.MD to go over all Hospital Tests and Procedure/Radiological results at the follow up, please get all Hospital records sent to your Prim MD by signing hospital release before you go home.   If you experience worsening of your admission symptoms, develop shortness of breath, life threatening emergency, suicidal or homicidal thoughts you must seek medical attention immediately by calling 911 or calling your MD immediately  if symptoms less severe.  You Must read complete instructions/literature along with all the possible adverse reactions/side effects for all the Medicines you take and that have been prescribed to you. Take any new Medicines after you have completely understood and accpet all the possible adverse reactions/side effects.   Do not drive, operating heavy machinery, perform activities at heights, swimming or participation in water activities or provide baby sitting services if your were admitted for syncope or siezures until you have seen by Primary MD or a Neurologist and advised to do so again.  Do not drive when taking Pain  medications.    Do not take more than prescribed Pain, Sleep and Anxiety Medications  Special Instructions: If you have smoked or chewed Tobacco  in the last 2 yrs please stop smoking, stop any regular Alcohol  and or any Recreational drug use.  Wear Seat belts while driving.   Please note  You were cared for by a hospitalist during your hospital stay. If you have any questions about your discharge medications or the care you received while you were in the hospital after you are discharged, you can call the unit and asked to speak with the hospitalist on call if the hospitalist that took care of you is not available. Once you are discharged, your primary care physician will handle any further medical issues. Please note that NO REFILLS for any discharge medications will be authorized once you are discharged, as it is imperative that you return to your primary care physician (or establish a relationship with a primary care physician if you do not have one) for your aftercare needs so that they can reassess your need for medications and monitor your lab values. Information on my medicine - XARELTO (rivaroxaban)  This medication education was reviewed with me or my healthcare representative as part of my discharge preparation.  The pharmacist that spoke with me during my hospital stay was:  Blossom Hoops, [place]? Xarelto was prescribed to treat blood clots that may have been found in the veins of your legs (deep vein thrombosis) or in your lungs (pulmonary embolism) and to reduce the risk  of them occurring again.  What do you need to know about Xarelto? The starting dose is one 15 mg tablet taken TWICE daily with food for the FIRST 21 DAYS then on 01/02/2014  the dose is changed to one 20 mg tablet taken ONCE A DAY with your evening meal.  DO NOT stop taking Xarelto without talking to the health care provider who prescribed the medication.  Refill your  prescription for 20 mg tablets before you run out.  After discharge, you should have regular check-up appointments with your healthcare provider that is prescribing your Xarelto.  In the future your dose may need to be changed if your kidney function changes by a significant amount.  What do you do if you miss a dose? If you are taking Xarelto TWICE DAILY and you miss a dose, take it as soon as you remember. You may take two 15 mg tablets (total 30 mg) at the same time then resume your regularly scheduled 15 mg twice daily the next day.  If you are taking Xarelto ONCE DAILY and you miss a dose, take it as soon as you remember on the same day then continue your regularly scheduled once daily regimen the next day. Do not take two doses of Xarelto at the same time.   Important Safety Information Xarelto is a blood thinner medicine that can cause bleeding. You should call your healthcare provider right away if you experience any of the following:   Bleeding from an injury or your nose that does not stop.   Unusual colored urine (red or dark brown) or unusual colored stools (red or black).   Unusual bruising for unknown reasons.   A serious fall or if you hit your head (even if there is no bleeding).  Some medicines may interact with Xarelto and might increase your risk of bleeding while on Xarelto. To help avoid this, consult your healthcare provider or pharmacist prior to using any new prescription or non-prescription medications, including herbals, vitamins, non-steroidal anti-inflammatory drugs (NSAIDs) and supplements.  This website has more information on Xarelto: [email or web address].[email or web address]

## 2013-12-14 LAB — GLUCOSE, CAPILLARY
GLUCOSE-CAPILLARY: 135 mg/dL — AB (ref 70–99)
GLUCOSE-CAPILLARY: 125 mg/dL — AB (ref 70–99)
Glucose-Capillary: 116 mg/dL — ABNORMAL HIGH (ref 70–99)

## 2013-12-14 MED ORDER — AMLODIPINE BESYLATE 10 MG PO TABS: 10.0000 mg | ORAL_TABLET | Freq: Every day | ORAL | Status: DC

## 2013-12-14 MED ORDER — RIVAROXABAN 15 MG PO TABS: 15.0000 mg | ORAL_TABLET | Freq: Two times a day (BID) | ORAL | Status: DC

## 2013-12-14 MED ORDER — RIVAROXABAN 20 MG PO TABS: 20.0000 mg | ORAL_TABLET | Freq: Every day | ORAL | Status: DC

## 2013-12-14 MED ORDER — LEVOFLOXACIN 500 MG PO TABS: 500.0000 mg | ORAL_TABLET | Freq: Every day | ORAL | Status: AC

## 2013-12-14 MED ORDER — GUAIFENESIN ER 600 MG PO TB12: 600.0000 mg | ORAL_TABLET | Freq: Two times a day (BID) | ORAL | Status: AC

## 2013-12-14 MED ORDER — LACTINEX PO CHEW: 1.0000 | CHEWABLE_TABLET | Freq: Three times a day (TID) | ORAL | Status: AC

## 2013-12-14 NOTE — Discharge Summary (Signed)
[name], 79 y.o., DOB 1934/11/30, MRN [id number]. Admission date: 12/09/2013 Discharge Date 12/14/2013 Primary MD [name], MD Admitting Physician Velvet Bathe, MD  Admission Diagnosis  dehydration  Discharge Diagnosis   Principal Problem:   CAP (community acquired pneumonia) Active Problems:   HYPERTENSION NEC   Chronic systolic CHF (congestive heart failure)   PNA (pneumonia)   Acute DVT (deep venous thrombosis)      Past Medical History  Diagnosis Date  . Hyperlipidemia   . PVD (peripheral vascular disease)   . Hypertension   . COPD (chronic obstructive pulmonary disease)   . Colon cancer   . Alzheimer's dementia 2013  . Pneumonia 2014  . Dyspnea   . Type II diabetes mellitus   . Depression   . CHF (congestive heart failure)   . Anemia     HX OF ANEMIA    Past Surgical History  Procedure Laterality Date  . Colon surgery  1996    cancer  . Lumbar disc surgery  2005  . Vascular surgery    . Colon resection  1997    /enc. notes 05/17/2004 (04/28/2013)  . Femoral artery - femoral artery bypass graft      right to left/enc. notes 05/17/2004 (04/28/2013)  . Femoral bypass Right     /enc. notes 05/17/2004 (04/28/2013)  . Fiberoptic bronchoscopy      /enc. notes 03/16/2005 (04/28/2013)  . Cataract extraction w/ intraocular lens implant Left 01/2013  . Cataract extraction Right 02/2013  . Femoral-popliteal bypass graft Right 04/29/2013    Procedure:  FEMORAL-POPLITEAL ARTERY Bypass Graft with intraoperative ultrasound and arteriogarm;  Surgeon: Mal Misty, MD;  Location: [place];  Service: Vascular;  Laterality: Right;  . Intraoperative arteriogram Right 04/29/2013    Procedure: INTRA OPERATIVE ARTERIOGRAM;  Surgeon: Mal Misty, MD;  Location: [place];  Service: Vascular;  Laterality: Right;  . Spine surgery    . Esophagogastroduodenoscopy N/A 09/05/2013    Procedure: ESOPHAGOGASTRODUODENOSCOPY (EGD);  Surgeon: Lear Ng, MD;  Location: [facility] ENDOSCOPY;   Service: Endoscopy;  Laterality: N/A;   brief history of present illness:  79 year old male presents with cough, congestion, shortness of breath, poor appetite, chest x-ray show left lower lung opacity suspicious for pneumonia,  was started on IV Rocephin and azithromycin for community-acquired pneumonia 9/16 till 9/21, remains a febrile, no leukocytosis upon discharge. Patient was noticed to have left lower extremity edema and physical exam 9/19,  report of the venous Doppler showing acute DVT femoral vein, patient was started on Xarelto.  As well patient has been evaluated by speech and swallow service, and was recommended dysphagia 3 diet.     Hospital Course See H&P, Labs, Consult and Test reports for all details in brief, patient was admitted for **  Principal Problem:   CAP (community acquired pneumonia) Active Problems:   HYPERTENSION NEC   Chronic systolic CHF (congestive heart failure)   PNA (pneumonia)   Acute DVT (deep venous thrombosis)  Community-acquired pneumonia  - Finished 5 days of IV Rocephin and azithromycin, afebrile, no hypoxia, no leukocytosis. -Improved phlegm secretions with flutter valve and respiratory therapy.  - Will be discharged on 5 days total of oral levofloxacin. Acute DVT  -Despite being on subcutaneous heparin for DVT prophylaxis, patient developed left lower extremity DVT , was started on Xarelto, it is thought to be provoked DVT given his hospital stay and having poor ambulatotion, so decision of anticoagulation should be addressed by his primary M.D. in 6 months.  Diabetes mellitus  - Patient has not been taking any oral hypoglycemic agents at home, his blood glucose was controlled while inpatient without any intervention, so continue to monitor as an outpatient. Hypertension  -Patient blood pressure was uncontrolled, so added Norvasc to his home antihypertensive regimen Hyper lipidemia  - Continue with statin   Consults   PT consult Social  worker consult   Significant Tests:  See full reports for all details    Dg Chest 1 View  12/09/2013   CLINICAL DATA:  Shortness of breath.  EXAM: CHEST - 1 VIEW  COMPARISON:  Chest radiograph performed earlier today at 3:44 p.m.  FINDINGS: There is a persistent small left pleural effusion, with minimal left basilar opacity that may reflect atelectasis or mild pneumonia. The right lung appears clear. No pneumothorax is seen  The cardiomediastinal silhouette is normal in size. No acute osseous abnormalities are identified.  IMPRESSION: Persistent small left pleural effusion. Minimal left basilar airspace opacity may reflect atelectasis or mild pneumonia.   Electronically Signed   By: Garald Balding M.D.   On: 12/09/2013 22:02   Dg Chest 2 View  12/09/2013   CLINICAL DATA:  Irregular heart rhythm.  Cough.  EXAM: CHEST  2 VIEW  COMPARISON:  10/27/2013 and chest CT dated 09/01/2013  FINDINGS: Heart size and pulmonary vascularity are normal. There is chronic pleural thickening at the right lung base. There is a new small left effusion. There is chronic accentuation of the interstitial markings at both bases. No acute osseous abnormality.  IMPRESSION: New small left effusion. Chronic bibasilar interstitial lung disease with scarring at the right base.   Electronically Signed   By: Rozetta Nunnery M.D.   On: 12/09/2013 20:23   Dg Chest Port 1 View  12/13/2013   CLINICAL DATA:  Cough. Followup left pleural effusion and left basilar atelectasis versus pneumonia.  EXAM: PORTABLE CHEST - 1 VIEW  COMPARISON:  Portable chest x-ray 12/09/2013. Two-view chest x-ray of that same date, 10/27/2013, 7/1/1,015.  FINDINGS: Cardiac silhouette mildly enlarged but stable. Pulmonary vascularity normal. Emphysematous changes in the upper lobes. Interval increase in size of the now moderate size left pleural effusion. Worsening patchy opacities in the left lung base. No new pulmonary parenchymal abnormalities elsewhere.  IMPRESSION:  Worsening left basilar pneumonia and enlarging left pleural effusion, superimposed upon COPD/emphysema.   Electronically Signed   By: Evangeline Dakin M.D.   On: 12/13/2013 09:12    Lower extremity venous Doppler: 12/12/2013 Summary:  - Findings consistent with acute deep vein thrombosis involving the common femoral and femoral veins of the left lower extremity. No propagation noted to the right lower extremity. - No evidence of Baker&'s cyst on the left.  Other specific details can be found in the table(s) above. Prepared and Electronically Authenticated by  Curt Jews 2015-09-20T11:05:22   Today   Subjective:   [name] today has no headache,no chest abdominal pain,no new weakness tingling or numbness, feels much better today.  Objective:   Blood pressure 170/78, pulse 86, temperature 97.8 F (36.6 C), temperature source Oral, resp. rate 18, height 5\' 11"  (1.803 m), weight 72.938 kg (160 lb 12.8 oz), SpO2 97.00%.  Intake/Output Summary (Last 24 hours) at 12/14/13 0838 Last data filed at 12/14/13 0452  Gross per 24 hour  Intake      0 ml  Output    700 ml  Net   -700 ml    Exam Awake Alert, Oriented , No new F.N deficits, Normal  affect, mildly confused which is his baseline. [place].AT,PERRAL Supple Neck,No JVD, No cervical lymphadenopathy appriciated.  Symmetrical Chest wall movement, Good air movement bilaterally, CTAB RRR,No Gallops,Rubs or new Murmurs, No Parasternal Heave +ve B.Sounds, Abd Soft, Non tender, No organomegaly appriciated, No rebound -guarding or rigidity. No Cyanosis, Clubbing or edema, No new Rash or bruise  Data Review     CBC w Diff: Lab Results  Component Value Date   WBC 8.0 12/13/2013   WBC 10.5* 12/09/2013   HGB 12.9* 12/13/2013   HGB 13.1* 12/09/2013   HCT 41.6 12/13/2013   HCT 44.8 12/09/2013   PLT 300 12/13/2013   LYMPHOPCT 21 10/27/2013   MONOPCT 10 10/27/2013   EOSPCT 9* 10/27/2013   BASOPCT 1 10/27/2013   CMP: Lab Results  Component  Value Date   NA 143 12/13/2013   K 3.9 12/13/2013   CL 106 12/13/2013   CO2 24 12/13/2013   BUN 12 12/13/2013   CREATININE 1.00 12/13/2013   PROT 7.1 09/01/2013   ALBUMIN 3.4* 09/01/2013   BILITOT 0.5 09/01/2013   ALKPHOS 80 09/01/2013   AST 19 09/01/2013   ALT 6 09/01/2013  .  Micro Results Recent Results (from the past 240 hour(s))  CULTURE, BLOOD (ROUTINE X 2)     Status: None   Collection Time    12/09/13  8:36 PM      Result Value Ref Range Status   Specimen Description BLOOD LEFT ARM   Final   Special Requests BOTTLES DRAWN AEROBIC ONLY 10CC   Final   Culture  Setup Time     Final   Value: 12/10/2013 00:55     Performed at [facility]   Culture     Final   Value:        BLOOD CULTURE RECEIVED NO GROWTH TO DATE CULTURE WILL BE HELD FOR 5 DAYS BEFORE ISSUING A FINAL NEGATIVE REPORT     Performed at [facility]   Report Status PENDING   Incomplete  CULTURE, BLOOD (ROUTINE X 2)     Status: None   Collection Time    12/09/13  8:53 PM      Result Value Ref Range Status   Specimen Description BLOOD LEFT HAND   Final   Special Requests BOTTLES DRAWN AEROBIC ONLY 5CC   Final   Culture  Setup Time     Final   Value: 12/10/2013 00:55     Performed at [facility]   Culture     Final   Value:        BLOOD CULTURE RECEIVED NO GROWTH TO DATE CULTURE WILL BE HELD FOR 5 DAYS BEFORE ISSUING A FINAL NEGATIVE REPORT     Performed at [facility]   Report Status PENDING   Incomplete     Discharge Instructions     Repeat CBC, and chest x-ray in 3 days, to follow on left lower lobe pneumonia, and to check CBC as patient is on Xarelto.  Follow-up Information   Follow up with [name], MD.   Specialty:  Internal Medicine   Contact information:   [address] [place] [address] [phone]       Discharge Medications     Medication List    STOP taking these medications       aspirin 81 MG EC tablet      TAKE these  medications       amLODipine 10 MG tablet  Commonly known  as:  NORVASC  Take 1 tablet (10 mg total) by mouth daily.     budesonide-formoterol 160-4.5 MCG/ACT inhaler  Commonly known as:  SYMBICORT  Inhale 2 puffs into the lungs 2 (two) times daily.     digoxin 0.125 MG tablet  Commonly known as:  LANOXIN  Take 0.125 mg by mouth daily.     FERRALET 90 90-1 MG Tabs  Take 1 tablet by mouth daily.     furosemide 20 MG tablet  Commonly known as:  LASIX  Take 1 tablet (20 mg total) by mouth daily.     guaiFENesin 600 MG 12 hr tablet  Commonly known as:  MUCINEX  Take 1 tablet (600 mg total) by mouth 2 (two) times daily.     lactobacillus acidophilus & bulgar chewable tablet  Chew 1 tablet by mouth 3 (three) times daily with meals.     levofloxacin 500 MG tablet  Commonly known as:  LEVAQUIN  Take 1 tablet (500 mg total) by mouth daily.     metoprolol tartrate 25 MG tablet  Commonly known as:  LOPRESSOR  Take 0.5 tablets (12.5 mg total) by mouth 2 (two) times daily.     olmesartan 40 MG tablet  Commonly known as:  BENICAR  Take 40 mg by mouth at bedtime.     Rivaroxaban 15 MG Tabs tablet  Commonly known as:  XARELTO  Take 1 tablet (15 mg total) by mouth 2 (two) times daily with a meal.     rivaroxaban 20 MG Tabs tablet  Commonly known as:  XARELTO  Take 1 tablet (20 mg total) by mouth daily with supper.  Start taking on:  01/03/2014     rosuvastatin 20 MG tablet  Commonly known as:  CRESTOR  Take 20 mg by mouth at bedtime.     Vitamin D 2000 UNITS Caps  Take 2,000 Units by mouth daily.         Total Time in preparing paper work, data evaluation and todays exam -40 minutes  Arthea Nobel M.D on 12/14/2013 at 8:38 AM  [facility] Office  [phone]

## 2013-12-14 NOTE — Progress Notes (Signed)
Pt prepared for d/c to SNF. IV d/c'd. Skin intact except as most recently charted. Vitals are stable. Left message for receiving facility supervisor to call back for report on the patient. Pt transported by [facility].

## 2013-12-14 NOTE — Progress Notes (Signed)
Patient evaluated for community based chronic disease management services with [facility] as a benefit of patient's [employer]. Spoke with patient's wife at bedside to explain [facility] services.  Wife is unsure if services are needed at this time.  Rehab facility representative was in at bedside as well as patient is possible discharging today.  Left [facility] literature with wife for review.  Will collaborate with [facility] team to assist with engagement post SNF discharge.  Of note, [facility] Care Management services does not replace or interfere with any services that are arranged by inpatient case management or social work.  For additional questions or referrals please contact Corliss Blacker BSN RN [facility] Liaison at [phone].

## 2013-12-14 NOTE — Clinical Social Work Note (Signed)
Per MD patient ready for DC to [facility]. RN, patient, patient's family, and facility notified of DC. RN given number for report. DC packet on chart. [facility] transport requested for patient. CSW signing off.    Liz Beach MSW, [place], [place], [phone]

## 2013-12-14 NOTE — Clinical Social Work Placement (Signed)
Clinical Social Work Department CLINICAL SOCIAL WORK PLACEMENT NOTE 12/14/2013  Patient:  [name], [name]  Account Number:  [id number] Admit date:  12/09/2013  Clinical Social Worker:  Charlene Brooke, LCSW  Date/time:  12/11/2013 05:25 PM  Clinical Social Work is seeking post-discharge placement for this patient at the following level of care:   [place]   (*CSW will update this form in Epic as items are completed)   12/11/2013  Patient/family provided with [facility] Department of Clinical Social Work's list of facilities offering this level of care within the geographic area requested by the patient (or if unable, by the patient's family).  12/11/2013  Patient/family informed of their freedom to choose among providers that offer the needed level of care, that participate in Medicare, Medicaid or managed care program needed by the patient, have an available bed and are willing to accept the patient.  12/11/2013  Patient/family informed of [facility]' ownership interest in [facility], as well as of the fact that they are under no obligation to receive care at this facility.  PASARR submitted to EDS on 12/11/2013 PASARR number received on 12/11/2013  FL2 transmitted to all facilities in geographic area requested by pt/family on  12/11/2013 FL2 transmitted to all facilities within larger geographic area on 12/11/2013  Patient informed that his/her managed care company has contracts with or will negotiate with  certain facilities, including the following:     Patient/family informed of bed offers received:  12/14/2013 Patient chooses bed at [facility] Physician recommends and patient chooses bed at    Patient to be transferred to [facility] on  12/14/2013 Patient to be transferred to facility by Ambulance Patient and family notified of transfer on 12/14/2013 Name of family member notified:  Mrs. Dubie (wife) and daughter  The following physician request were  entered in Epic:   Additional Comments:    Per MD patient ready for DC to [facility]. RN, patient, patient's family, and facility notified of DC. RN given number for report. DC packet on chart. [facility] transport requested for patient. CSW signing off.    Liz Beach MSW, [place], [place], [phone]

## 2013-12-16 LAB — CULTURE, BLOOD (ROUTINE X 2)
CULTURE: NO GROWTH
Culture: NO GROWTH

## 2013-12-17 ENCOUNTER — Non-Acute Institutional Stay (SKILLED_NURSING_FACILITY): Payer: Commercial Managed Care - HMO | Admitting: Internal Medicine

## 2013-12-17 DIAGNOSIS — E1059 Type 1 diabetes mellitus with other circulatory complications: Secondary | ICD-10-CM

## 2013-12-17 DIAGNOSIS — I82409 Acute embolism and thrombosis of unspecified deep veins of unspecified lower extremity: Secondary | ICD-10-CM

## 2013-12-17 DIAGNOSIS — J189 Pneumonia, unspecified organism: Secondary | ICD-10-CM

## 2013-12-17 DIAGNOSIS — I5022 Chronic systolic (congestive) heart failure: Secondary | ICD-10-CM

## 2013-12-17 DIAGNOSIS — I82402 Acute embolism and thrombosis of unspecified deep veins of left lower extremity: Secondary | ICD-10-CM

## 2013-12-17 DIAGNOSIS — I509 Heart failure, unspecified: Secondary | ICD-10-CM

## 2013-12-18 DIAGNOSIS — E1051 Type 1 diabetes mellitus with diabetic peripheral angiopathy without gangrene: Secondary | ICD-10-CM | POA: Insufficient documentation

## 2013-12-18 DIAGNOSIS — E1059 Type 1 diabetes mellitus with other circulatory complications: Secondary | ICD-10-CM | POA: Insufficient documentation

## 2013-12-18 NOTE — Progress Notes (Signed)
HISTORY & PHYSICAL  DATE: 12/17/2013   FACILITY: [facility]  LEVEL OF CARE: SNF (31)  ALLERGIES:  No Known Allergies  CHIEF COMPLAINT:  Manage pneumonia, left lower extremity DVT and diabetes   HISTORY OF PRESENT ILLNESS: 79 year old African American male was hospitalized secondary to pneumonia. After hospitalization he is admitted to this facility for short-term rehabilitation.  PNEUMONIA: The pneumonia remains stable.  The patient denies ongoing chest pain, cough, shortness of breath, fever, chills or night sweats. No complications reported from the current antibiotic being used.  DVT: The DVTs remains stable.  Patient denies calf pain, chest pain or shortness of breath.  Patient is currently on anti-coagulation and does not report any complications from that. Patient was diagnosed with an acute left lower extremity DVT and started on xarelto.  DM:pt's DM remains stable.  Pt denies polyuria, polydipsia, polyphagia, changes in vision or hypoglycemic episodes.  No complications noted from the medication presently being used.  Last hemoglobin A1c is: Not available.  PAST MEDICAL HISTORY :  Past Medical History  Diagnosis Date  . Hyperlipidemia   . PVD (peripheral vascular disease)   . Hypertension   . COPD (chronic obstructive pulmonary disease)   . Colon cancer   . Alzheimer's dementia 2013  . Pneumonia 2014  . Dyspnea   . Type II diabetes mellitus   . Depression   . CHF (congestive heart failure)   . Anemia     HX OF ANEMIA    PAST SURGICAL HISTORY: Past Surgical History  Procedure Laterality Date  . Colon surgery  1996    cancer  . Lumbar disc surgery  2005  . Vascular surgery    . Colon resection  1997    /enc. notes 05/17/2004 (04/28/2013)  . Femoral artery - femoral artery bypass graft      right to left/enc. notes 05/17/2004 (04/28/2013)  . Femoral bypass Right     /enc. notes 05/17/2004 (04/28/2013)  . Fiberoptic bronchoscopy     /enc. notes 03/16/2005 (04/28/2013)  . Cataract extraction w/ intraocular lens implant Left 01/2013  . Cataract extraction Right 02/2013  . Femoral-popliteal bypass graft Right 04/29/2013    Procedure:  FEMORAL-POPLITEAL ARTERY Bypass Graft with intraoperative ultrasound and arteriogarm;  Surgeon: Mal Misty, MD;  Location: [facility];  Service: Vascular;  Laterality: Right;  . Intraoperative arteriogram Right 04/29/2013    Procedure: INTRA OPERATIVE ARTERIOGRAM;  Surgeon: Mal Misty, MD;  Location: [place];  Service: Vascular;  Laterality: Right;  . Spine surgery    . Esophagogastroduodenoscopy N/A 09/05/2013    Procedure: ESOPHAGOGASTRODUODENOSCOPY (EGD);  Surgeon: Lear Ng, MD;  Location: [facility];  Service: Endoscopy;  Laterality: N/A;    SOCIAL HISTORY:  reports that he quit smoking about 20 years ago. His smoking use included Cigarettes. He has a 12 pack-year smoking history. He has never used smokeless tobacco. He reports that he does not drink alcohol or use illicit drugs.  FAMILY HISTORY:  Family History  Problem Relation Age of Onset  . Stroke Mother 35  . Hypertension Mother   . Cancer Sister   . Heart disease Sister   . Heart disease Brother     Heart Disease before age 58  . Heart attack Brother   . Cancer Sister   . Cancer Brother     CURRENT MEDICATIONS: Reviewed per MAR/see medication list  REVIEW OF SYSTEMS:  See HPI otherwise 14 point ROS is  negative.  PHYSICAL EXAMINATION  VS:  See VS section  GENERAL: no acute distress, moderately obese body habitus EYES: conjunctivae normal, sclerae normal, normal eye lids MOUTH/THROAT: lips without lesions,no lesions in the mouth,tongue is without lesions,uvula elevates in midline NECK: supple, trachea midline, no neck masses, no thyroid tenderness, no thyromegaly LYMPHATICS: no LAN in the neck, no supraclavicular LAN RESPIRATORY: breathing is even & unlabored, BS CTAB CARDIAC: RRR, no murmur,no extra heart  sounds, no edema GI:  ABDOMEN: abdomen soft, normal BS, no masses, no tenderness  LIVER/SPLEEN: no hepatomegaly, no splenomegaly MUSCULOSKELETAL: HEAD: normal to inspection  EXTREMITIES: LEFT UPPER EXTREMITY: full range of motion, normal strength & tone RIGHT UPPER EXTREMITY:  full range of motion, normal strength & tone LEFT LOWER EXTREMITY:  full range of motion, normal strength & tone RIGHT LOWER EXTREMITY:  full range of motion, normal strength & tone PSYCHIATRIC: the patient is alert & oriented to person, affect & behavior appropriate  LABS/RADIOLOGY:  Labs reviewed: Basic Metabolic Panel:  Recent Labs  09/05/13 0402  12/09/13 2036 12/11/13 0628 12/13/13 0545  NA 139  < > 139 139 143  K 4.1  < > 3.9 4.2 3.9  CL 102  < > 98 101 106  CO2 22  < > 26 19 24   GLUCOSE 120*  < > 105* 125* 113*  BUN 22  < > 14 16 12   CREATININE 0.88  < > 1.03 0.87 1.00  CALCIUM 9.1  < > 9.8 8.7 8.8  MG 2.1  --   --   --   --   < > = values in this interval not displayed. Liver Function Tests:  Recent Labs  03/23/13 0240 04/28/13 1127 09/01/13 1834  AST 20 15 19   ALT 7 5 6   ALKPHOS 78 72 80  BILITOT 0.6 0.5 0.5  PROT 7.5 7.0 7.1  ALBUMIN 3.8 3.1* 3.4*    Recent Labs  03/23/13 0240  LIPASE 26   CBC:  Recent Labs  09/02/13 0700  09/23/13 1522 10/27/13 2302 12/09/13 1641 12/11/13 0628 12/13/13 0545  WBC 9.7  < > 9.7 8.3 10.5* 10.0 8.0  NEUTROABS 6.8  --  6.7 5.0  --   --   --   HGB 8.5*  < > 9.5* 10.4* 13.1* 13.0 12.9*  HCT 28.8*  < > 30.4* 35.6* 44.8 40.9 41.6  MCV 70.2*  < > 71.0* 78.9 85.3 79.1 80.5  PLT 394  < > 302.0 423*  --  271 300  < > = values in this interval not displayed.  Cardiac Enzymes:  Recent Labs  09/01/13 2207 09/02/13 0700 09/02/13 1327  TROPONINI <0.30 <0.30 <0.30   CBG:  Recent Labs  12/14/13 0752 12/14/13 1134 12/14/13 1706  GLUCAP 116* 135* 125*    Left Lower Extremity Venous Duplex Evaluation  Patient:    [name], [name] MR #:       [id number] Study Date: 12/12/2013 Gender:     M Age:        79 Height: Weight: BSA: Pt. Status: Room:       5W12C   SONOGRAPHER  Sharion Dove, RVS  ATTENDING    Elgergawy, Donne Hazel     Elgergawy, Dawood S  REFERRING    Elgergawy, [place], [place]  Reports also to:  ------------------------------------------------------------------- History and indications:  Indications  729.81 Swelling of limb.  History  Diagnostic evaluation.  ------------------------------------------------------------------- Study information:  Portable.  Study status:  Urgent.  Procedure:  A vascular evaluation was performed with the patient in the supine position. The right common femoral, left common femoral, left femoral, left greater saphenous, left lesser saphenous, left profunda femoral, left popliteal, left peroneal, and left posterior tibial veins were studied. Image quality was adequate.    Left lower extremity venous duplex evaluation.     Doppler flow study including B-mode compression maneuvers of all visualized segments, color flow Doppler and selected views of pulsed wave Doppler.  Birthdate: Patient birthdate: 1934/11/10.  Age:  Patient is 79 yr old.  Sex: Gender: male.  Study date:  Study date: 12/12/2013. Study time: 10:55 AM.  Location:  Bedside.  Patient status:  Inpatient. Venous flow:  +----------------+----------+----------------------+--------------+ Location        Overall   Flow properties       Thrombus       +----------------+----------+----------------------+--------------+ Left common     ThrombosedNoncompressible       Acute thrombus femoral                                                        +----------------+----------+----------------------+--------------+ Left femoral    ThrombosedNoncompressible       Acute  thrombus +----------------+----------+----------------------+--------------+ Left profunda   Patent    Compressible          -------------- femoral                                                        +----------------+----------+----------------------+--------------+ Left popliteal  Patent    Phasic; spontaneous;  --------------                           compressible                         +----------------+----------+----------------------+--------------+ Left posterior  Patent    Compressible          -------------- tibial                                                         +----------------+----------+----------------------+--------------+ Left peroneal   Patent    Compressible          -------------- +----------------+----------+----------------------+--------------+ Left            Patent    Compressible          -------------- saphenofemoral                                                 junction                                                       +----------------+----------+----------------------+--------------+  Left greater    Patent    Compressible          -------------- saphenous                                                      +----------------+----------+----------------------+--------------+ Left lesser     Patent    Compressible          -------------- saphenous                                                      +----------------+----------+----------------------+--------------+ Right common    Patent    Phasic; spontaneous;  -------------- femoral                   compressible                         +----------------+----------+----------------------+--------------+ Right femoral   Patent    Phasic; spontaneous;  --------------                           compressible                          +----------------+----------+----------------------+--------------+  ------------------------------------------------------------------- Summary:  - Findings consistent with acute deep vein thrombosis involving the   common femoral and femoral veins of the left lower extremity. No   propagation noted to the right lower extremity. - No evidence of Baker&'s cyst on the left.  PORTABLE CHEST - 1 VIEW   COMPARISON:  Portable chest x-ray 12/09/2013. Two-view chest x-ray of that same date, 10/27/2013, 7/1/1,015.   FINDINGS: Cardiac silhouette mildly enlarged but stable. Pulmonary vascularity normal. Emphysematous changes in the upper lobes. Interval increase in size of the now moderate size left pleural effusion. Worsening patchy opacities in the left lung base. No new pulmonary parenchymal abnormalities elsewhere.   IMPRESSION: Worsening left basilar pneumonia and enlarging left pleural effusion, superimposed upon COPD/emphysema.    ASSESSMENT/PLAN:  Pneumonia-continue Levaquin as prescribed Left lower extremity DVT-continue xarelto diabetes mellitus-continue current medications CHF-compensated Alzheimer's dementia-stable COPD-compensated Check CBC  I have reviewed patient's medical records received at admission/from hospitalization.  CPT CODE: 29924  Gayani Y Dasanayaka, [place] [phone]

## 2013-12-23 ENCOUNTER — Encounter: Payer: Self-pay | Admitting: Adult Health

## 2013-12-23 ENCOUNTER — Non-Acute Institutional Stay (SKILLED_NURSING_FACILITY): Payer: Commercial Managed Care - HMO | Admitting: Adult Health

## 2013-12-23 DIAGNOSIS — J181 Lobar pneumonia, unspecified organism: Principal | ICD-10-CM

## 2013-12-23 DIAGNOSIS — J189 Pneumonia, unspecified organism: Secondary | ICD-10-CM

## 2013-12-23 NOTE — Progress Notes (Signed)
Patient ID: [name], male   DOB: 1934/05/31, 79 y.o.   MRN: [id number]   12/23/2013  [facility]ility[facility] Location:  [place] Room Number: 207-P LEVEL OF CARE:  SNF (31)   Chief Complaint  Patient presents with  . Acute Visit    Pneumonia    PAST MEDICAL HISTORY:  Past Medical History  Diagnosis Date  . Hyperlipidemia   . PVD (peripheral vascular disease)   . Hypertension   . COPD (chronic obstructive pulmonary disease)   . Colon cancer   . Alzheimer's dementia 2013  . Pneumonia 2014  . Dyspnea   . Type II diabetes mellitus   . Depression   . CHF (congestive heart failure)   . Anemia     HX OF ANEMIA    CURRENT MEDICATIONS: Reviewed per MAR/see medication list  No Known Allergies   REVIEW OF SYSTEMS:  GENERAL: no change in appetite, no fatigue, no weight changes, no fever, chills or weakness RESPIRATORY: no DOE, wheezing, hemoptysis, +cough & SOB CARDIAC: no chest pain, edema or palpitations GI: no abdominal pain, diarrhea, constipation, heart burn, nausea or vomiting  PHYSICAL EXAMINATION  GENERAL: no acute distress, normal body habitus EYES: conjunctivae normal, sclerae normal, normal eye lids NECK: supple, trachea midline, no neck masses, no thyroid tenderness, no thyromegaly LYMPHATICS: no LAN in the neck, no supraclavicular LAN RESPIRATORY: breathing is even & unlabored CARDIAC: RRR, no murmur,no extra heart sounds, no edema GI: abdomen soft, normal BS, no masses, no tenderness, no hepatomegaly, no splenomegaly EXTREMITIES: Able to move x4 extremities; uses a rolling walker when ambulating PSYCHIATRIC: the patient is alert & oriented to person, affect & behavior appropriate  LABS/RADIOLOGY: 12/22/13  chest x-ray impression shows that basilar pneumonia Labs reviewed: Basic Metabolic Panel:  Recent Labs  09/05/13 0402  12/09/13 2036 12/11/13 0628 12/13/13 0545  NA 139  < > 139 139 143  K 4.1  < > 3.9 4.2  3.9  CL 102  < > 98 101 106  CO2 22  < > 26 19 24   GLUCOSE 120*  < > 105* 125* 113*  BUN 22  < > 14 16 12   CREATININE 0.88  < > 1.03 0.87 1.00  CALCIUM 9.1  < > 9.8 8.7 8.8  MG 2.1  --   --   --   --   < > = values in this interval not displayed. Liver Function Tests:  Recent Labs  03/23/13 0240 04/28/13 1127 09/01/13 1834  AST 20 15 19   ALT 7 5 6   ALKPHOS 78 72 80  BILITOT 0.6 0.5 0.5  PROT 7.5 7.0 7.1  ALBUMIN 3.8 3.1* 3.4*    Recent Labs  03/23/13 0240  LIPASE 26   CBC:  Recent Labs  09/02/13 0700  09/23/13 1522 10/27/13 2302 12/09/13 1641 12/11/13 0628 12/13/13 0545  WBC 9.7  < > 9.7 8.3 10.5* 10.0 8.0  NEUTROABS 6.8  --  6.7 5.0  --   --   --   HGB 8.5*  < > 9.5* 10.4* 13.1* 13.0 12.9*  HCT 28.8*  < > 30.4* 35.6* 44.8 40.9 41.6  MCV 70.2*  < > 71.0* 78.9 85.3 79.1 80.5  PLT 394  < > 302.0 423*  --  271 300  < > = values in this interval not displayed.  Cardiac Enzymes:  Recent Labs  09/01/13 2207 09/02/13 0700 09/02/13 1327  TROPONINI <0.30 <0.30 <0.30   CBG:  Recent Labs  12/14/13 0752 12/14/13 1134 12/14/13 1706  GLUCAP 116* 135* 125*   Dg Chest 1 View  12/09/2013   CLINICAL DATA:  Shortness of breath.  EXAM: CHEST - 1 VIEW  COMPARISON:  Chest radiograph performed earlier today at 3:44 p.m.  FINDINGS: There is a persistent small left pleural effusion, with minimal left basilar opacity that may reflect atelectasis or mild pneumonia. The right lung appears clear. No pneumothorax is seen  The cardiomediastinal silhouette is normal in size. No acute osseous abnormalities are identified.  IMPRESSION: Persistent small left pleural effusion. Minimal left basilar airspace opacity may reflect atelectasis or mild pneumonia.   Electronically Signed   By: Garald Balding M.D.   On: 12/09/2013 22:02   Dg Chest 2 View  12/09/2013   CLINICAL DATA:  Irregular heart rhythm.  Cough.  EXAM: CHEST  2 VIEW  COMPARISON:  10/27/2013 and chest CT dated 09/01/2013   FINDINGS: Heart size and pulmonary vascularity are normal. There is chronic pleural thickening at the right lung base. There is a new small left effusion. There is chronic accentuation of the interstitial markings at both bases. No acute osseous abnormality.  IMPRESSION: New small left effusion. Chronic bibasilar interstitial lung disease with scarring at the right base.   Electronically Signed   By: Rozetta Nunnery M.D.   On: 12/09/2013 20:23   Dg Chest Port 1 View  12/13/2013   CLINICAL DATA:  Cough. Followup left pleural effusion and left basilar atelectasis versus pneumonia.  EXAM: PORTABLE CHEST - 1 VIEW  COMPARISON:  Portable chest x-ray 12/09/2013. Two-view chest x-ray of that same date, 10/27/2013, 7/1/1,015.  FINDINGS: Cardiac silhouette mildly enlarged but stable. Pulmonary vascularity normal. Emphysematous changes in the upper lobes. Interval increase in size of the now moderate size left pleural effusion. Worsening patchy opacities in the left lung base. No new pulmonary parenchymal abnormalities elsewhere.  IMPRESSION: Worsening left basilar pneumonia and enlarging left pleural effusion, superimposed upon COPD/emphysema.   Electronically Signed   By: Evangeline Dakin M.D.   On: 12/13/2013 09:12    ASSESSMENT/PLAN:  Pneumonia - start Levaquin 500 mg 1 tab by mouth daily x10 days and Florasto 250 mg PO BID X 13 days  CPT CODE: 62694    [facility], NP [facility] [phone]

## 2013-12-25 ENCOUNTER — Non-Acute Institutional Stay (SKILLED_NURSING_FACILITY): Payer: Commercial Managed Care - HMO | Admitting: Adult Health

## 2013-12-25 ENCOUNTER — Encounter: Payer: Self-pay | Admitting: Adult Health

## 2013-12-25 DIAGNOSIS — I10 Essential (primary) hypertension: Secondary | ICD-10-CM

## 2013-12-25 DIAGNOSIS — I5022 Chronic systolic (congestive) heart failure: Secondary | ICD-10-CM

## 2013-12-25 DIAGNOSIS — E118 Type 2 diabetes mellitus with unspecified complications: Secondary | ICD-10-CM

## 2013-12-25 DIAGNOSIS — E785 Hyperlipidemia, unspecified: Secondary | ICD-10-CM

## 2013-12-25 DIAGNOSIS — E119 Type 2 diabetes mellitus without complications: Secondary | ICD-10-CM | POA: Insufficient documentation

## 2013-12-25 DIAGNOSIS — J449 Chronic obstructive pulmonary disease, unspecified: Secondary | ICD-10-CM

## 2013-12-25 DIAGNOSIS — J181 Lobar pneumonia, unspecified organism: Principal | ICD-10-CM

## 2013-12-25 DIAGNOSIS — J189 Pneumonia, unspecified organism: Secondary | ICD-10-CM

## 2013-12-25 DIAGNOSIS — I82402 Acute embolism and thrombosis of unspecified deep veins of left lower extremity: Secondary | ICD-10-CM

## 2013-12-25 NOTE — Progress Notes (Signed)
Patient ID: [name], male   DOB: 1934-08-14, 79 y.o.   MRN: [id number]   12/25/2013  Facility:  [facility] Location:  [place] Room Number: 207-P LEVEL OF CARE:  SNF (31)   Chief Complaint  Patient presents with  . Discharge Note   HISTORY OF PRESENT ILLNESS:  This is a 79 year old male who has been admitted to [facility] on 12/14/13 from [facility] with community-acquired pneumonia. Patient was admitted to this facility for short-term rehabilitation after the patient's recent hospitalization.  Patient has completed SNF rehabilitation and therapy has cleared the patient for discharge.  Reassessment of ongoing problem(s):  PNEUMONIA: The pneumonia remains stable.  The patient denies ongoing chest pain, cough, shortness of breath, fever, chills or night sweats. No complications reported from the current antibiotic being used.  HTN: Pt 's HTN remains stable.  Denies CP, sob, DOE, headaches, dizziness or visual disturbances.  No complications from the medications currently being used.  Last BP : 126/68  DVT: The DVTs remains stable.  Patient denies calf pain, chest pain or shortness of breath.  Patient is currently on anti-coagulation and does not report any complications from that.   PAST MEDICAL HISTORY:  Past Medical History  Diagnosis Date  . Hyperlipidemia   . PVD (peripheral vascular disease)   . Hypertension   . COPD (chronic obstructive pulmonary disease)   . Colon cancer   . Alzheimer's dementia 2013  . Pneumonia 2014  . Dyspnea   . Type II diabetes mellitus   . Depression   . CHF (congestive heart failure)   . Anemia     HX OF ANEMIA    CURRENT MEDICATIONS: Reviewed per MAR/see medication list  No Known Allergies   REVIEW OF SYSTEMS:  GENERAL: no change in appetite, no fatigue, no weight changes, no fever, chills or weakness RESPIRATORY: no DOE, wheezing, hemoptysis CARDIAC: no chest pain,  palpitations GI:  no abdominal pain, diarrhea, constipation, heart burn, nausea or vomiting  PHYSICAL EXAMINATION  GENERAL: no acute distress, normal body habitus NECK: supple, trachea midline, no neck masses, no thyroid tenderness, no thyromegaly LYMPHATICS: no LAN in the neck, no supraclavicular LAN RESPIRATORY: breathing is even & unlabored CARDIAC: RRR, no murmur,no extra heart sounds, no edema GI: abdomen soft, normal BS, no masses, no tenderness, no hepatomegaly, no splenomegaly EXTREMITIES: Able to move x4 extremities; uses a rolling walker when ambulating PSYCHIATRIC: the patient is alert & oriented to person, affect & behavior appropriate  LABS/RADIOLOGY: 12/22/13  chest x-ray impression shows that basilar pneumonia 12/18/13  WBC 7.8 hemoglobin 12.6 hematocrit 43.2 MCV 84.5 Labs reviewed: Basic Metabolic Panel:  Recent Labs  09/05/13 0402  12/09/13 2036 12/11/13 0628 12/13/13 0545  NA 139  < > 139 139 143  K 4.1  < > 3.9 4.2 3.9  CL 102  < > 98 101 106  CO2 22  < > 26 19 24   GLUCOSE 120*  < > 105* 125* 113*  BUN 22  < > 14 16 12   CREATININE 0.88  < > 1.03 0.87 1.00  CALCIUM 9.1  < > 9.8 8.7 8.8  MG 2.1  --   --   --   --   < > = values in this interval not displayed. Liver Function Tests:  Recent Labs  03/23/13 0240 04/28/13 1127 09/01/13 1834  AST 20 15 19   ALT 7 5 6   ALKPHOS 78 72 80  BILITOT 0.6 0.5  0.5  PROT 7.5 7.0 7.1  ALBUMIN 3.8 3.1* 3.4*    Recent Labs  03/23/13 0240  LIPASE 26   CBC:  Recent Labs  09/02/13 0700  09/23/13 1522 10/27/13 2302 12/09/13 1641 12/11/13 0628 12/13/13 0545  WBC 9.7  < > 9.7 8.3 10.5* 10.0 8.0  NEUTROABS 6.8  --  6.7 5.0  --   --   --   HGB 8.5*  < > 9.5* 10.4* 13.1* 13.0 12.9*  HCT 28.8*  < > 30.4* 35.6* 44.8 40.9 41.6  MCV 70.2*  < > 71.0* 78.9 85.3 79.1 80.5  PLT 394  < > 302.0 423*  --  271 300  < > = values in this interval not displayed.  Cardiac Enzymes:  Recent Labs  09/01/13 2207 09/02/13 0700 09/02/13 1327   TROPONINI <0.30 <0.30 <0.30   CBG:  Recent Labs  12/14/13 0752 12/14/13 1134 12/14/13 1706  GLUCAP 116* 135* 125*   Dg Chest 1 View  12/09/2013   CLINICAL DATA:  Shortness of breath.  EXAM: CHEST - 1 VIEW  COMPARISON:  Chest radiograph performed earlier today at 3:44 p.m.  FINDINGS: There is a persistent small left pleural effusion, with minimal left basilar opacity that may reflect atelectasis or mild pneumonia. The right lung appears clear. No pneumothorax is seen  The cardiomediastinal silhouette is normal in size. No acute osseous abnormalities are identified.  IMPRESSION: Persistent small left pleural effusion. Minimal left basilar airspace opacity may reflect atelectasis or mild pneumonia.   Electronically Signed   By: Garald Balding M.D.   On: 12/09/2013 22:02   Dg Chest 2 View  12/09/2013   CLINICAL DATA:  Irregular heart rhythm.  Cough.  EXAM: CHEST  2 VIEW  COMPARISON:  10/27/2013 and chest CT dated 09/01/2013  FINDINGS: Heart size and pulmonary vascularity are normal. There is chronic pleural thickening at the right lung base. There is a new small left effusion. There is chronic accentuation of the interstitial markings at both bases. No acute osseous abnormality.  IMPRESSION: New small left effusion. Chronic bibasilar interstitial lung disease with scarring at the right base.   Electronically Signed   By: Rozetta Nunnery M.D.   On: 12/09/2013 20:23   Dg Chest Port 1 View  12/13/2013   CLINICAL DATA:  Cough. Followup left pleural effusion and left basilar atelectasis versus pneumonia.  EXAM: PORTABLE CHEST - 1 VIEW  COMPARISON:  Portable chest x-ray 12/09/2013. Two-view chest x-ray of that same date, 10/27/2013, 7/1/1,015.  FINDINGS: Cardiac silhouette mildly enlarged but stable. Pulmonary vascularity normal. Emphysematous changes in the upper lobes. Interval increase in size of the now moderate size left pleural effusion. Worsening patchy opacities in the left lung base. No new pulmonary  parenchymal abnormalities elsewhere.  IMPRESSION: Worsening left basilar pneumonia and enlarging left pleural effusion, superimposed upon COPD/emphysema.   Electronically Signed   By: Evangeline Dakin M.D.   On: 12/13/2013 09:12    ASSESSMENT/PLAN:  Pneumonia - stable; continue Levaquin Chronic systolic CHF - stable; continue digoxin and Lasix Acute DVT, LLE - continue Xarelto Diabetes mellitus, type II - diet controlled Hypertension - well controlled; continue Norvasc, Lopressor and Benicar Hyperlipidemia - continue Crestor COPD - compensated; continue Symbicort   I have filled out patient's discharge paperwork and written prescriptions.  Patient will receive home health PT, OT, ST,  and CNA.  Total discharge time: Less than 30 minutes  Discharge time involved coordination of the discharge process with Education officer, museum, nursing staff  and therapy department. Medical justification for home health services verified.    CPT CODE: 20601    MEDINA-VARGAS,MONINA, [facility] [phone]

## 2014-01-05 DIAGNOSIS — J440 Chronic obstructive pulmonary disease with acute lower respiratory infection: Secondary | ICD-10-CM

## 2014-01-05 DIAGNOSIS — I509 Heart failure, unspecified: Secondary | ICD-10-CM

## 2014-01-05 DIAGNOSIS — J181 Lobar pneumonia, unspecified organism: Secondary | ICD-10-CM

## 2014-01-05 DIAGNOSIS — E119 Type 2 diabetes mellitus without complications: Secondary | ICD-10-CM

## 2014-01-21 ENCOUNTER — Other Ambulatory Visit: Payer: Self-pay | Admitting: Internal Medicine

## 2014-01-21 DIAGNOSIS — R609 Edema, unspecified: Secondary | ICD-10-CM

## 2014-01-22 ENCOUNTER — Ambulatory Visit
Admission: RE | Admit: 2014-01-22 | Discharge: 2014-01-22 | Disposition: A | Discharge status: Home / Self Care | Payer: Commercial Managed Care - HMO | Source: Ambulatory Visit | Attending: Internal Medicine | Admitting: Internal Medicine

## 2014-01-22 DIAGNOSIS — R609 Edema, unspecified: Secondary | ICD-10-CM

## 2014-01-29 ENCOUNTER — Other Ambulatory Visit: Payer: Self-pay | Admitting: Internal Medicine

## 2014-02-16 ENCOUNTER — Other Ambulatory Visit: Payer: Self-pay | Admitting: Internal Medicine

## 2014-03-04 ENCOUNTER — Encounter (HOSPITAL_COMMUNITY): Payer: Self-pay | Admitting: Vascular Surgery

## 2014-03-23 ENCOUNTER — Encounter: Payer: Self-pay | Admitting: Family

## 2014-03-23 ENCOUNTER — Encounter (HOSPITAL_COMMUNITY): Payer: Commercial Managed Care - HMO

## 2014-03-23 ENCOUNTER — Other Ambulatory Visit: Payer: Self-pay | Admitting: Internal Medicine

## 2014-03-23 ENCOUNTER — Ambulatory Visit: Payer: Commercial Managed Care - HMO | Admitting: Family

## 2014-03-23 ENCOUNTER — Other Ambulatory Visit (HOSPITAL_COMMUNITY): Payer: Commercial Managed Care - HMO

## 2014-03-30 ENCOUNTER — Other Ambulatory Visit (HOSPITAL_COMMUNITY): Payer: Commercial Managed Care - HMO

## 2014-03-30 ENCOUNTER — Ambulatory Visit: Payer: Commercial Managed Care - HMO | Admitting: Family

## 2014-03-30 ENCOUNTER — Encounter (HOSPITAL_COMMUNITY): Payer: Commercial Managed Care - HMO

## 2014-04-19 ENCOUNTER — Other Ambulatory Visit: Payer: Self-pay | Admitting: Internal Medicine

## 2014-04-22 ENCOUNTER — Encounter: Payer: Self-pay | Admitting: Family

## 2014-04-23 ENCOUNTER — Encounter: Payer: Self-pay | Admitting: Vascular Surgery

## 2014-04-23 ENCOUNTER — Ambulatory Visit (INDEPENDENT_AMBULATORY_CARE_PROVIDER_SITE_OTHER)
Admission: RE | Admit: 2014-04-23 | Discharge: 2014-04-23 | Disposition: A | Discharge status: Home / Self Care | Payer: Commercial Managed Care - HMO | Source: Ambulatory Visit | Attending: Vascular Surgery | Admitting: Vascular Surgery

## 2014-04-23 ENCOUNTER — Ambulatory Visit (INDEPENDENT_AMBULATORY_CARE_PROVIDER_SITE_OTHER): Payer: Commercial Managed Care - HMO | Admitting: Vascular Surgery

## 2014-04-23 ENCOUNTER — Other Ambulatory Visit: Payer: Self-pay | Admitting: Vascular Surgery

## 2014-04-23 ENCOUNTER — Ambulatory Visit (HOSPITAL_COMMUNITY)
Admission: RE | Admit: 2014-04-23 | Discharge: 2014-04-23 | Disposition: A | Discharge status: Home / Self Care | Payer: Commercial Managed Care - HMO | Source: Ambulatory Visit | Attending: Family | Admitting: Family

## 2014-04-23 VITALS — BP 164/69 | HR 64 | Temp 97.8°F | Resp 18 | Ht 72.0 in | Wt 178.0 lb

## 2014-04-23 DIAGNOSIS — E119 Type 2 diabetes mellitus without complications: Secondary | ICD-10-CM | POA: Insufficient documentation

## 2014-04-23 DIAGNOSIS — L98499 Non-pressure chronic ulcer of skin of other sites with unspecified severity: Secondary | ICD-10-CM

## 2014-04-23 DIAGNOSIS — Z48812 Encounter for surgical aftercare following surgery on the circulatory system: Secondary | ICD-10-CM | POA: Diagnosis present

## 2014-04-23 DIAGNOSIS — I739 Peripheral vascular disease, unspecified: Secondary | ICD-10-CM

## 2014-04-23 DIAGNOSIS — I70203 Unspecified atherosclerosis of native arteries of extremities, bilateral legs: Secondary | ICD-10-CM | POA: Insufficient documentation

## 2014-04-23 DIAGNOSIS — I10 Essential (primary) hypertension: Secondary | ICD-10-CM | POA: Insufficient documentation

## 2014-04-23 DIAGNOSIS — Z87891 Personal history of nicotine dependence: Secondary | ICD-10-CM | POA: Diagnosis not present

## 2014-04-23 DIAGNOSIS — E785 Hyperlipidemia, unspecified: Secondary | ICD-10-CM | POA: Insufficient documentation

## 2014-04-23 DIAGNOSIS — Z86718 Personal history of other venous thrombosis and embolism: Secondary | ICD-10-CM

## 2014-04-23 DIAGNOSIS — I7025 Atherosclerosis of native arteries of other extremities with ulceration: Secondary | ICD-10-CM

## 2014-04-23 NOTE — Progress Notes (Signed)
    Established Previous Bypass  History of Present Illness  [name] is a 80 y.o. (1935-01-18) male who presents with chief complaint: follow-up.  Previous operation(s) have been completed by Dr. Kellie Simmering and Dr. Donnetta Hutching include: R to L fem-fem BPG, R CIA stenting, R fem-pop BPG and L fem-pop BPG.  The patient is demented so history is primarily obtained from the wife.  Reportedly, the patient is have pain at night--time that wakes him up.  The patient does not ambulate extensive.  Wife is not certain if the patient gets intermittent claudication.    Additionally, the wife has concerns in regards Xarelto for a DVT.  She stopped the Xarelto due to nose bleeding.    The patient's PMH, PSH, SH, FamHx, Med, and Allergies are unchanged from 09/22/13.  On ROS today: possible rest pain, unclear if he has intermittent claudication, no wounds noted  Physical Examination  Filed Vitals:   04/23/14 1605  BP: 164/69  Pulse: 64  Temp: 97.8 F (36.6 C)  TempSrc: Oral  Resp: 18  Height: 6' (1.829 m)  Weight: 178 lb (80.74 kg)  SpO2: 98%   Body mass index is 24.14 kg/(m^2).  Physical Examination  Filed Vitals:   04/23/14 1605  BP: 164/69  Pulse: 64  Temp: 97.8 F (36.6 C)  TempSrc: Oral  Resp: 18  Height: 6' (1.829 m)  Weight: 178 lb (80.74 kg)  SpO2: 98%   Body mass index is 24.14 kg/(m^2).  General: Alert, WD, thin, pleasantly demented  Pulmonary: Sym exp, good air movt, CTAB, no rales, rhonchi, & wheezing  Cardiac: RRR, Nl S1, S2, no Murmurs, rubs or gallops  Vascular: Vessel Right Left  Radial Faintly Palpable Palpable  Brachial Faintly Palpable Palpable  Carotid Palpable, without bruit Palpable, without bruit  Aorta Not palpable N/A  Femoral Palpable Palpable  Popliteal Not palpable Not palpable  PT Not Palpable Not Palpable  DP Not Palpable Not Palpable   Gastrointestinal: soft, NTND, -G/R, - HSM, - masses, - CVAT B  Musculoskeletal: limited exam due to dementia  , Extremities without ischemic changes   Neurologic: limited exam due dementia  Non-Invasive Vascular Imaging ABI (Date: 04/23/2014) R: 0.51 (0.69), DP: mono, PT: mono, TBI: 0.54 L: 0.35 (00.51), DP: mono, PT: mono, TBI: 0.13  Medical Decision Making  [name] is a 80 y.o. male who presents with: possible L leg rest pain, s/p R fem-pop BPG, R-to-L fem-fem BPG, thrombosed L fem-pop BPG, h/o DVT.   It's not clear to me if this patient really has rest pain.  I gave the wife strict instructions to document this patient's sx at night time to try to determine if he truly has rest pain in the L leg.  In regards to the DVT question, there are conflicting venous duplex studies with a negative study one month after the positive study.    Regardless, >3 months have elapsed without the patient taking Xarelto so I recommended we just repeat the BLE venous duplex.  The patient and wife will follow up with Dr. Kellie Simmering in 2 weeks with the repeat BLE venous duplex and to see Dr. Kellie Simmering again in regards to his Left leg symptoms.  Thank you for allowing [place] to participate in this patient's care.  Adele Barthel, MD [facility] Office: [phone] Pager: [phone]  04/23/2014, 5:28 PM

## 2014-05-04 ENCOUNTER — Other Ambulatory Visit: Payer: Self-pay | Admitting: Internal Medicine

## 2014-05-14 ENCOUNTER — Other Ambulatory Visit: Payer: Self-pay | Admitting: Internal Medicine

## 2014-05-17 ENCOUNTER — Encounter: Payer: Self-pay | Admitting: Vascular Surgery

## 2014-05-18 ENCOUNTER — Ambulatory Visit (HOSPITAL_COMMUNITY)
Admission: RE | Admit: 2014-05-18 | Discharge: 2014-05-18 | Disposition: A | Discharge status: Home / Self Care | Payer: Commercial Managed Care - HMO | Source: Ambulatory Visit | Attending: Vascular Surgery | Admitting: Vascular Surgery

## 2014-05-18 ENCOUNTER — Ambulatory Visit (INDEPENDENT_AMBULATORY_CARE_PROVIDER_SITE_OTHER): Payer: Medicare HMO | Admitting: Vascular Surgery

## 2014-05-18 ENCOUNTER — Other Ambulatory Visit: Payer: Self-pay | Admitting: Family

## 2014-05-18 VITALS — BP 124/63 | HR 81 | Resp 16 | Ht 71.0 in | Wt 175.0 lb

## 2014-05-18 DIAGNOSIS — M79604 Pain in right leg: Secondary | ICD-10-CM | POA: Diagnosis not present

## 2014-05-18 DIAGNOSIS — M79605 Pain in left leg: Secondary | ICD-10-CM | POA: Diagnosis not present

## 2014-05-18 DIAGNOSIS — I739 Peripheral vascular disease, unspecified: Secondary | ICD-10-CM

## 2014-05-18 DIAGNOSIS — Z86718 Personal history of other venous thrombosis and embolism: Secondary | ICD-10-CM | POA: Diagnosis not present

## 2014-05-18 DIAGNOSIS — Z48812 Encounter for surgical aftercare following surgery on the circulatory system: Secondary | ICD-10-CM

## 2014-05-18 NOTE — Progress Notes (Signed)
Subjective:     Patient ID: [name], male   DOB: [birth year], 80 y.o.   MRN: [id number]  HPI this 80 year old male returns having seen Dr. Bridgett Larsson recently. There is some question about possible rest pain in the left lower extremity and also a history of DVT. There were some conflicting studies in the past. Patient is currently not on anticoagulation. He does have dementia and is accompanied by his wife. He is well known to me. He underwent right femoral-popliteal bypass grafting with Gore-Tex in February 2015 and his ulcerated toe successfully healed. He has a right to left femoral-femoral bypass and an occluded left femoral-popliteal above-knee Gore-Tex graft. Patient today denies severe pain in the left foot or numbness in the left foot although his wife states that he does complain at night of some discomfort and swelling. He has no history of ulcerations in the left foot.  Past Medical History  Diagnosis Date  . Hyperlipidemia   . PVD (peripheral vascular disease)   . Hypertension   . COPD (chronic obstructive pulmonary disease)   . Colon cancer   . Alzheimer's dementia 2013  . Pneumonia 2014  . Dyspnea   . Type II diabetes mellitus   . Depression   . CHF (congestive heart failure)   . Anemia     HX OF ANEMIA    History  Substance Use Topics  . Smoking status: Former Smoker -- 0.30 packs/day for 40 years    Types: Cigarettes    Quit date: 03/26/1993  . Smokeless tobacco: Never Used  . Alcohol Use: No    Family History  Problem Relation Age of Onset  . Stroke Mother 96  . Hypertension Mother   . Cancer Sister   . Heart disease Sister   . Heart disease Brother     Heart Disease before age 58  . Heart attack Brother   . Cancer Sister   . Cancer Brother     No Known Allergies   Current outpatient prescriptions:  .  amLODipine (NORVASC) 10 MG tablet, Take 1 tablet (10 mg total) by mouth daily., Disp: , Rfl:  .  budesonide-formoterol (SYMBICORT) 160-4.5 MCG/ACT  inhaler, Inhale 2 puffs into the lungs 2 (two) times daily., Disp: , Rfl:  .  Cholecalciferol (VITAMIN D) 2000 UNITS CAPS, Take 2,000 Units by mouth daily. , Disp: , Rfl:  .  digoxin (LANOXIN) 0.125 MG tablet, Take 0.125 mg by mouth daily. , Disp: , Rfl:  .  Fe Cbn-Fe Gluc-FA-B12-C-DSS (FERRALET 90) 90-1 MG TABS, Take 1 tablet by mouth daily., Disp: , Rfl:  .  furosemide (LASIX) 20 MG tablet, Take 1 tablet (20 mg total) by mouth daily., Disp: 5 tablet, Rfl: 0 .  metoprolol tartrate (LOPRESSOR) 25 MG tablet, Take 0.5 tablets (12.5 mg total) by mouth 2 (two) times daily., Disp: 30 tablet, Rfl: 0 .  rosuvastatin (CRESTOR) 20 MG tablet, Take 20 mg by mouth at bedtime. , Disp: , Rfl:  .  valsartan (DIOVAN) 160 MG tablet, Take 160 mg by mouth daily., Disp: , Rfl:  .  olmesartan (BENICAR) 40 MG tablet, Take 40 mg by mouth at bedtime. , Disp: , Rfl:  .  Rivaroxaban (XARELTO) 15 MG TABS tablet, Take 1 tablet (15 mg total) by mouth 2 (two) times daily with a meal., Disp: 30 tablet, Rfl:   BP 124/63 mmHg  Pulse 81  Resp 16  Ht 5\' 11"  (1.803 m)  Wt 175 lb (79.379 kg)  BMI 24.42 kg/m2  Body mass index is 24.42 kg/(m^2).           Review of Systems patient has  dementia. Denies chest pain. Objective:   Physical Exam BP 124/63 mmHg  Pulse 81  Resp 16  Ht 5\' 11"  (1.803 m)  Wt 175 lb (79.379 kg)  BMI 24.42 kg/m2  Gen. well-developed well-nourished male in no apparent distress-pleasant-a few subtle signs of dementia noted  Lungs no rhonchi or wheezing Cardiovascular regular rhythm no murmurs Right leg with 2+ femoral and 1-2+ popliteal pulse palpable no evidence of infection or ischemia in the right leg Left leg with 2+ femoral absent distal pulses. Easily audible anterior tibial flow at the ankle. Today I ordered bilateral venous studies per Dr. Tamera Punt rule out DVT and both studies revealed there was no evidence of DVT. Saphenous veins are of small caliber bilaterally. There is some old  superficial thrombophlebitis in the mid section of the left great saphenous vein.  Recent ABI was 0.52 on the right and 0.35 on the left with patent femoral popliteal bypass graft on the right     Assessment:     I do not think patient is having rest pain in the left foot to the agree that he would require attempt at a revascularization which has low chance of success. He would need below-knee femoral-popliteal bypass grafting with Gore-Tex and he has our he had a failed graft on the left side. He currently has no nonhealing ulcers or limb threatening ischemia. Ulcerations in the right foot have healed. Discussed this at length with patient's wife and she is in agreement. I stressed the importance of avoiding pressure sores or ulcerations    Plan:     Patient return in 6 months for duplex scan of his right femoral-popliteal graft and repeat ABIs and also scan of his right to left femoral-femoral bypass and be seen by nurse practitioner If he does develop pressure sore nonhealing ulcer in the left foot he is a candidate for attempt at femoral-popliteal grafting on the left

## 2014-05-19 NOTE — Addendum Note (Signed)
Addended by: Peter Minium K on: 05/19/2014 12:00 PM   Modules accepted: Orders

## 2014-06-02 ENCOUNTER — Other Ambulatory Visit: Payer: Self-pay | Admitting: Internal Medicine

## 2014-06-04 ENCOUNTER — Other Ambulatory Visit (INDEPENDENT_AMBULATORY_CARE_PROVIDER_SITE_OTHER): Payer: Commercial Managed Care - HMO

## 2014-06-04 ENCOUNTER — Encounter: Payer: Self-pay | Admitting: Internal Medicine

## 2014-06-04 ENCOUNTER — Ambulatory Visit (INDEPENDENT_AMBULATORY_CARE_PROVIDER_SITE_OTHER): Payer: Commercial Managed Care - HMO | Admitting: Internal Medicine

## 2014-06-04 ENCOUNTER — Ambulatory Visit (INDEPENDENT_AMBULATORY_CARE_PROVIDER_SITE_OTHER)
Admission: RE | Admit: 2014-06-04 | Discharge: 2014-06-04 | Disposition: A | Discharge status: Home / Self Care | Payer: Commercial Managed Care - HMO | Source: Ambulatory Visit | Attending: Internal Medicine | Admitting: Internal Medicine

## 2014-06-04 VITALS — BP 160/70 | HR 60 | Ht 71.0 in | Wt 181.2 lb

## 2014-06-04 DIAGNOSIS — R0602 Shortness of breath: Secondary | ICD-10-CM

## 2014-06-04 DIAGNOSIS — J449 Chronic obstructive pulmonary disease, unspecified: Secondary | ICD-10-CM

## 2014-06-04 LAB — CBC WITH DIFFERENTIAL/PLATELET
Basophils Absolute: 0 10*3/uL (ref 0.0–0.1)
Basophils Relative: 0.6 % (ref 0.0–3.0)
Eosinophils Absolute: 0.7 10*3/uL (ref 0.0–0.7)
Eosinophils Relative: 9 % — ABNORMAL HIGH (ref 0.0–5.0)
HCT: 42.7 % (ref 39.0–52.0)
Hemoglobin: 14 g/dL (ref 13.0–17.0)
Lymphocytes Relative: 13.4 % (ref 12.0–46.0)
Lymphs Abs: 1.1 10*3/uL (ref 0.7–4.0)
MCHC: 32.9 g/dL (ref 30.0–36.0)
MCV: 90.9 fl (ref 78.0–100.0)
Monocytes Absolute: 0.8 10*3/uL (ref 0.1–1.0)
Monocytes Relative: 9.6 % (ref 3.0–12.0)
NEUTROS PCT: 67.4 % (ref 43.0–77.0)
Neutro Abs: 5.3 10*3/uL (ref 1.4–7.7)
Platelets: 239 10*3/uL (ref 150.0–400.0)
RBC: 4.7 Mil/uL (ref 4.22–5.81)
RDW: 14.6 % (ref 11.5–15.5)
WBC: 7.9 10*3/uL (ref 4.0–10.5)

## 2014-06-04 LAB — BASIC METABOLIC PANEL
BUN: 12 mg/dL (ref 6–23)
CO2: 31 mEq/L (ref 19–32)
Calcium: 9.6 mg/dL (ref 8.4–10.5)
Chloride: 103 mEq/L (ref 96–112)
Creatinine, Ser: 1.14 mg/dL (ref 0.40–1.50)
GFR: 80 mL/min (ref >60.00)
GLUCOSE: 100 mg/dL — AB (ref 70–99)
Potassium: 4 mEq/L (ref 3.5–5.1)
SODIUM: 139 meq/L (ref 135–145)

## 2014-06-04 LAB — BRAIN NATRIURETIC PEPTIDE: PRO B NATRI PEPTIDE: 160 pg/mL — AB (ref 0.0–100.0)

## 2014-06-04 LAB — TSH: TSH: 0.94 u[IU]/mL (ref 0.35–4.50)

## 2014-06-04 MED ORDER — ACLIDINIUM BROMIDE 400 MCG/ACT IN AEPB: 1.0000 | INHALATION_SPRAY | Freq: Two times a day (BID) | RESPIRATORY_TRACT | Status: DC

## 2014-06-04 MED ORDER — PREDNISONE 10 MG PO TABS: ORAL_TABLET | ORAL | Status: DC

## 2014-06-04 NOTE — Progress Notes (Signed)
Subjective:     Patient ID: [name], male   DOB: [birth year]  MRN: [id number]     Brief patient profile:  80  yobm s/p smoking cessation in 1995 previously felt to have COPD  GOLD II criteria with reversibility 01/06/09    HPI January 06, 2009 Followup with PFT's. Pt states that overall breathing has been okay. He gets SOB when he gets in a rush. [name] states that pt gets out of breath just walking from one room to the next. Also he c/o dry cough- especially at night x 4-6 weeks eval by [facility] with cxr and rx cough syrup > no better. no purulent sputum. Try higher strength of Symbicort 160 2 puffs first thing in am and 2 puffs again in pm about 12 hours later and if happy with it fill the prescription.  as long as you are coughing take the zegerid otc at bedtime > seemed to help to pt and wife's satisfaction.      09/01/2013 Acute OV  Complains of increased SOB especially w/ exertion, wheezing, some dry cough x2-3weeks. No fever or discolored mucus  Occurred while at [facility]. No leg swelling, calf pain or edema.  At rest no significant dyspnea. But cant walk 10 feet without severe dyspnea, cough and wheezing.  Today in office with desats in 70% with ambulation.  EKG shows ST depression .  O2 sats at rest 100% on RA  No chest pain, n/v/d, hemoptysis , fever.  Denies f/c/s, hemoptysis, nausea, vomiting. No calf pain rec admit  Admit date: 09/01/2013  Discharge date: 09/07/2013  Recommendations for Outpatient Follow-up:  1. Acute hypoxic respiratory failure, multifactorial, patient newly started on home oxygen 2 L per minute continuous 2. Symptomatic microcytic anemia, status post iron infusion, consider repeat CBC in the next few weeks to assess response to oral iron therapy. 3. Evidence of worsened systolic congestive heart failure currently compensated. Currently euvolemic without diuretics. 4. Resolution of COPD exacerbation and possible community acquired  pneumonia. 5. Metformin has been discontinued secondary to his heart failure, blood sugars have been well controlled during this hospitalization without significant use of insulin or other agents. For now suggest continue monitor blood sugars and hold medication  6.  Discharge Diagnoses:  1. Acute hypoxic respiratory failure 2. Symptomatic microcytic anemia secondary to Candida esophagitis 3. Possible acute on chronic systolic congestive heart failure 4. Candida esophagitis 5. COPD, possible exacerbation 6. Possible [facility] pneumonia 7. Diabetes mellitus type 2 currently diet controlled Discharge Condition: Improved  Disposition: Home with home health RN, PT  Diet recommendation: Heart healthy diabetic diet  Filed Weights    09/05/13 0400  09/06/13 0410  09/07/13 0403   Weight:  74.707 kg (164 lb 11.2 oz)  74.2 kg (163 lb 9.3 oz)  72.7 kg (160 lb 4.4 oz)   History of present illness:  80 year old man presented with progressive dyspnea on exertion for 3 weeks, asymptomatic at rest, hypoxic with ambulation his pulmonologist office. He was sent to the emergency department for further evaluation of hypoxia. Initial evaluation revealed acute respiratory failure with hypoxia, mild CHF , possible exertional angina.  Hospital Course:  [name] was admitted for further evaluation of respiratory failure with hypoxia, abnormal EKG and possible COPD. He was seen in consultation with cardiology, cardiac enzymes were negative and there were no signs or symptoms to suggest acute coronary syndrome. 2-D echocardiogram did reveal worsening of chronic systolic heart failure, cardiology recommended medical management with no  further intervention. His respiratory status has remained stable without evidence of volume overload without diuretic therapy. He is to have microcytic anemia and seen in consultation with gastroenterology, underwent EGD which revealed Candida esophagitis, see full report below. He is  also felt to have a COPD exacerbation and possible pneumonia. With treatment of these issues his condition is improved at the time of discharge she is quite stable but will her car home oxygen. Individual issues as below. Beta blocker was recommended for his heart disease, he appears to be tolerating this well from a pulmonary standpoint.  1. Acute hypoxic respiratory failure. No hypoxia except with ambulation. Multifactorial, favor progression of COPD, possible pneumonia, anemia, systolic heart failure. 2. Symptomatic anemia. EGD revealed Candida esophagitis, gastric ulceration, moderate erosive esophagitis. Anemia felt to be multifactorial but esophagitis likely contributing. Stable. 3. Suspect subacute systolic congestive heart failure on admission. Echocardiogram with significant decrease in left ventricular ejection fraction. Suspect several week history of dyspnea on exertion related to decreased heart function combined with anemia and COPD. Discussed in detail with the patient's cardiologist recommended starting beta blocker, digoxin 4. Candida esophagitis. 5. Elevated troponin on admission. Subsequent troponins negative. No evidence of ACS on admission. 6. COPD possible exacerbation. Appears resolved. 7. Possible Community acquired pneumonia. Appears resolved. Finish Levaquin. 8. Diabetes mellitus type 2. Well controlled. 9. Alzheimer's dementia. Stable, at baseline 10. Tobacco dependence. Recommend cessation. Plan discharge home today with home health RN, PT.  Complete steroid taper, antibiotics  Iron on discharge, will need 7 days of Nystatin. PPI BID for 3 months and then QD. Followup with GI as needed. Consultants:  Cardiology Procedures:  Transfusion one unit packed red blood cells  EGD ENDOSCOPIC IMPRESSION: 1. Candida Esophagitis  2. Moderate Erosive Esophagitis  3. Mild Antral Gastritis  4. Gastric Ulceration  5. Small Hiatal Hernia  6. Minimal Duodenitis  7. Anemia likely  multifactorial but esophagitis likely contributing  to it  Antibiotics:  Levaquin 6/10 >> 6/17 Discharge Instructions  Discharge Instructions    Diet - low sodium heart healthy  Complete by: As directed       Diet Carb Modified  Complete by: As directed       Discharge instructions  Complete by: As directed    Call physician or seek immediate medical attention for increased shortness of breath, chest pain, bleeding or worsening in condition. Continue to check blood sugars every day. Metformin has been discontinued because of your heart weakness. However your blood sugars have been well controlled during her hospital stay. Call your physician for blood sugars greater than 400 or less than 70.     Increase activity slowly  Complete by: As directed              Medication List     STOP taking these medications       clopidogrel 75 MG tablet    Commonly known as: PLAVIX    metFORMIN 500 MG tablet    Commonly known as: GLUCOPHAGE     TAKE these medications       albuterol (2.5 MG/3ML) 0.083% nebulizer solution    Commonly known as: PROVENTIL    Take 2.5 mg by nebulization every 6 (six) hours as needed for wheezing or shortness of breath.    aspirin 81 MG EC tablet    Take 1 tablet (81 mg total) by mouth daily.    budesonide-formoterol 160-4.5 MCG/ACT inhaler    Commonly known as: SYMBICORT    Inhale 2 puffs  into the lungs 2 (two) times daily.    digoxin 0.125 MG tablet    Commonly known as: LANOXIN    Take 1 tablet (0.125 mg total) by mouth daily.    ferrous sulfate 325 (65 FE) MG EC tablet    Take 1 tablet (325 mg total) by mouth daily with breakfast.    levofloxacin 500 MG tablet    Commonly known as: LEVAQUIN    Take 1 tablet (500 mg total) by mouth daily. Start 6/16 in the morning.    metoprolol tartrate 25 MG tablet    Commonly known as: LOPRESSOR    Take 0.5 tablets (12.5 mg total) by mouth 2 (two) times daily.    nystatin 100000 UNIT/ML suspension    Commonly known  as: MYCOSTATIN    Take 5 mLs (500,000 Units total) by mouth 4 (four) times daily.    olmesartan 40 MG tablet    Commonly known as: BENICAR    Take 40 mg by mouth daily.    pantoprazole 40 MG tablet    Commonly known as: PROTONIX    Take 1 tablet (40 mg total) by mouth 2 (two) times daily.    predniSONE 10 MG tablet    Commonly known as: DELTASONE    Start 6/16 in AM. Take 20 mg by mouth daily for 3 days. Then take 10 mg by mouth daily for 3 days. Then stop.    rosuvastatin 20 MG tablet    Commonly known as: CRESTOR    Take 20 mg by mouth at bedtime.    Vitamin D 2000 UNITS Caps    Take 1 capsule by mouth at bedtime.      09/23/2013 post f/u ov/extensive ov at transition of care/[name] re: 02 24/7 at 2lpm Chief Complaint  Patient presents with  . HFU    Pt states breathing is some better, but not back to baseline. He c/o increased cough that started before he was d/c'ed- cough is non prod.   no worse since finished prednisone  Now on 02 continuously  @ 2lpm  Cough is mostly daytime, not assoc with meals Confused with meds rec Work on inhaler technique:     Only use your albuterol  as a rescue medication     06/04/2014 f/u ov/[name] re: COPD II    Chief Complaint  Patient presents with  . Acute Visit    Pt c/o increased SOB for the past 2 days.  He is SOB with exertion and spouse states he is gasping for air in his sleep.  He also c/o cough more than usual- occ prod with minimal clear sputum.    maint rx = symbicort 160 2bid   Has nebulizer but rarely uses   No obvious day to day or daytime variabilty or assoc  cp or chest tightness, subjective wheeze overt sinus or hb symptoms. No unusual exp hx or h/o childhood pna/ asthma or knowledge of premature birth.  Sleeping ok without nocturnal  or early am exacerbation  of respiratory  c/o's or need for noct saba. Also denies any obvious fluctuation of symptoms with weather or environmental changes or other aggravating or alleviating  factors except as outlined above   Current Medications, Allergies, Complete Past Medical History, Past Surgical History, Family History, and Social History were reviewed in [employer] record.  ROS  The following are not active complaints unless bolded sore throat, dysphagia, dental problems, itching, sneezing,  nasal congestion or excess/ purulent secretions, ear  ache,   fever, chills, sweats, unintended wt loss, pleuritic or exertional cp, hemoptysis,  orthopnea pnd or leg swelling, presyncope, palpitations, heartburn, abdominal pain, anorexia, nausea, vomiting, diarrhea  or change in bowel or urinary habits, change in stools or urine, dysuria,hematuria,  rash, arthralgias, visual complaints, headache, numbness weakness or ataxia or problems with walking or coordination,  change in mood/affect or memory.                  Past Medical History:  HYPERLIPIDEMIA (ICD-272.4)  PVD (ICD-443.9)  HYPERTENSION NEC  DYSPNEA (ICD-786.05)  COPD  PFT's 11/18/00 FEV1 is 1.94 or 60%  - PFT's 01/06/09 FEV1 1.75 (57%) ratio 49 and 21 % better FVC after B2 DLC0 71  - HFA 90% better p coaching 01/23/10             Objective:   Physical Exam   elderly black male ambulatory / audible pseudowheeze better with plm   Wt  187 January 06, 2009 > 186 01/23/10 > 05/04/2011  191 > 06/16/2013  170 >169 09/01/2013 >   169 09/23/2013 > 06/04/2014  181   HEENT mild turbinate edema. Edentulous,  Oropharynx no thrush or excess pnd or cobblestoning. No JVD or cervical adenopathy. Mild accessory muscle hypertrophy. Trachea midline, nl thryroid. Chest was hyperinflated by percussion with diminished breath sounds and moderate increased exp time with trace bilateral mid expiratory wheeze. Hoover sign positive at mid inspiration. Regular rate and rhythm without murmur gallop or rub or increase P2. No edema , neg homans sign Abd: no hsm, nl excursion. Ext warm without cyanosis or clubbing.        . CXR PA and Lateral:   06/04/2014 :     I personally reviewed images and agree with radiology impression as follows:      Interim near complete clearing of left lower lobe infiltrate .     Labs ordered/ reviewed    Lab 06/04/14 1004  NA 139  K 4.0  CL 103  CO2 31  BUN 12  CREATININE 1.14  GLUCOSE 100*    Recent Labs Lab 06/04/14 1004  HGB 14.0  HCT 42.7  WBC 7.9  PLT 239.0     Lab Results  Component Value Date   TSH 0.94 06/04/2014     Lab Results  Component Value Date   PROBNP 160.0* 06/04/2014           Assessment:

## 2014-06-04 NOTE — Patient Instructions (Addendum)
Prednisone 10 mg take  4 each am x 2 days,   2 each am x 2 days,  1 each am x 2 days and stop   Try over the counter prilosec 20mg   Take 30-60 min before first meal of the day and Pepcid 20 mg one bedtime until return   Start tudorza one twice daily after the symbicort  GERD (REFLUX)  is an extremely common cause of respiratory symptoms just like yours , many times with no obvious heartburn at all.    It can be treated with medication, but also with lifestyle changes including avoidance of late meals, excessive alcohol, smoking cessation, and avoid fatty foods, chocolate, peppermint, colas, red wine, and acidic juices such as orange juice.  NO MINT OR MENTHOL PRODUCTS SO NO COUGH DROPS  USE SUGARLESS CANDY INSTEAD (Jolley ranchers or Stover's or Life Savers) or even ice chips will also do - the key is to swallow to prevent all throat clearing. NO OIL BASED VITAMINS - use powdered substitutes.  Please remember to go to the  Lab and  x-ray department downstairs for your tests - we will call you with the results when they are available.     See Tammy NP in  2 weeks with all your medications, even over the counter meds, separated in two separate bags, the ones you take no matter what vs the ones you stop once you feel better and take only as needed when you feel you need them.   Tammy  will generate for you a new user friendly medication calendar that will put [place] all on the same page re: your medication use.     Without this process, it simply isn't possible to assure that we are providing  your outpatient care  with  the attention to detail we feel you deserve.   If we cannot assure that you're getting that kind of care,  then we cannot manage your problem effectively from this clinic.  Once you have seen Tammy and we are sure that we're all on the same page with your medication use she will arrange follow up with me  Late add walk on RA on return and schedule PFTs  When he comes to see me

## 2014-06-05 ENCOUNTER — Encounter: Payer: Self-pay | Admitting: Internal Medicine

## 2014-06-05 NOTE — Assessment & Plan Note (Signed)
Symptoms are markedly disproportionate to objective findings and not clear this is all a  lung problem but pt does appear to have difficult airway management issues.   See copd/ no evidence anemia/ chf/ thyoid dz

## 2014-06-05 NOTE — Assessment & Plan Note (Signed)
PFT's 11/18/00 FEV1 is 1.94 or 60%  - PFT's 01/06/09 FEV1 1.75 (57%) ratio 49 and 21 % better FVC after B2 DLC0 71  - Prevnar given 09/23/13  - 09/23/2013 p extensive coaching HFA effectiveness =    75%  The proper method of use, as well as anticipated side effects, of a metered-dose inhaler are discussed and demonstrated to the patient. Improved effectiveness after extensive coaching during this visit to a level of approximately  75% with hfa/ 90% with dpi > give turdorza trial though there is a risk it will contribute to his upper airway symptoms  Really not doing as well as he should be for a GOLD II copd with sign reversibility  I had an extended discussion with the patient and wife  reviewing all relevant studies completed to date and  lasting 15 to 20 minutes of a 25 minute visit on the following ongoing concerns:   1) not following med calendar action plan as I'd hoped, needs to return for full med rec.   To keep things simple, I have asked the patient to first separate medicines that are perceived as maintenance, that is to be taken daily "no matter what", from those medicines that are taken on only on an as-needed basis and I have given the patient examples of both, and then return to see our NP to generate a  detailed  medication calendar which should be followed until the next physician sees the patient and updates it.    2) ? Acid (or non-acid) GERD > always difficult to exclude as up to 75% of pts in some series report no assoc GI/ Heartburn symptoms> rec max (24h)  acid suppression and diet restrictions/ reviewed and instructions given in writing.   3) ? Allergic/ asthmatic component unaddressed > Prednisone 10 mg take  4 each am x 2 days,   2 each am x 2 days,  1 each am x 2 days and stop   4) Each maintenance medication was reviewed in detail including most importantly the difference between maintenance and as needed and under what circumstances the prns are to be used.  Please see  instructions for details which were reviewed in writing and the patient given a copy.

## 2014-06-07 ENCOUNTER — Telehealth: Payer: Self-pay | Admitting: Internal Medicine

## 2014-06-07 NOTE — Telephone Encounter (Signed)
Left message for patient to return call regarding lab results.  

## 2014-06-07 NOTE — Telephone Encounter (Signed)
Spoke with patient's wife regarding lab results.  Nothing further needed.

## 2014-06-07 NOTE — Progress Notes (Signed)
Quick Note:  LMTCB ______ 

## 2014-06-10 ENCOUNTER — Other Ambulatory Visit: Payer: Self-pay | Admitting: Internal Medicine

## 2014-06-16 ENCOUNTER — Encounter: Payer: Commercial Managed Care - HMO | Admitting: Adult Health

## 2014-07-06 ENCOUNTER — Encounter: Payer: Commercial Managed Care - HMO | Admitting: Adult Health

## 2014-07-27 ENCOUNTER — Encounter: Payer: Commercial Managed Care - HMO | Admitting: Adult Health

## 2014-10-13 NOTE — Patient Outreach (Signed)
[place] [facility]) Care Management  10/13/2014  JAYDRIAN CORPENING 08-29-1934 [id number]   Referral from [facility] Tier 4 list, assigned to Sherrin Daisy, RN for patient outreach.  Malan Werk L. Elbert Ewings [facility] Care Management Assistant [phone]

## 2014-10-14 ENCOUNTER — Other Ambulatory Visit: Payer: Self-pay | Admitting: Internal Medicine

## 2014-11-09 ENCOUNTER — Telehealth: Payer: Self-pay | Admitting: Internal Medicine

## 2014-11-09 NOTE — Telephone Encounter (Signed)
Called made aware we don't have any samples at this time. Nothing further needed

## 2014-11-12 ENCOUNTER — Other Ambulatory Visit: Payer: Self-pay | Admitting: *Deleted

## 2014-11-12 NOTE — Patient Outreach (Signed)
[place] [facility]) Care Management  11/12/2014  SRIKAR CHIANG 1935/03/01 [id number]   Referral from [facility] Tier 4: Telephone call to patient; left message on voice mail requesting return call.  Plan: will follow up. Sherrin Daisy, RN BSN [place] Management Coordinator [facility] Care Management  [phone]

## 2014-11-16 ENCOUNTER — Other Ambulatory Visit: Payer: Self-pay | Admitting: *Deleted

## 2014-11-16 NOTE — Patient Outreach (Signed)
[place] [facility]) Care Management  11/16/2014  LADEN FIELDHOUSE 1934-10-03 [id number]   2nd outreach call to patient; left message on voice mail requesting return call.  Plan: will follow up.   Sherrin Daisy, RN BSN [place] Management Coordinator [facility] Care Management  [phone]

## 2014-11-17 ENCOUNTER — Encounter: Payer: Self-pay | Admitting: *Deleted

## 2014-11-17 ENCOUNTER — Other Ambulatory Visit: Payer: Self-pay | Admitting: *Deleted

## 2014-11-17 NOTE — Patient Outreach (Signed)
[place] [facility]) Care Management  11/17/2014  [name] [birth year] [id number]  Telephone call to patient; left voice mail requesting call back.  Plan: will send outreach letter and follow up in 10 business days.  Sherrin Daisy, RN BSN [place] Management Coordinator [facility] Care Management  [phone]

## 2014-11-25 ENCOUNTER — Other Ambulatory Visit: Payer: Self-pay | Admitting: *Deleted

## 2014-11-25 ENCOUNTER — Telehealth: Payer: Self-pay | Admitting: Internal Medicine

## 2014-11-25 DIAGNOSIS — F0280 Dementia in other diseases classified elsewhere without behavioral disturbance: Secondary | ICD-10-CM

## 2014-11-25 DIAGNOSIS — G309 Alzheimer's disease, unspecified: Principal | ICD-10-CM

## 2014-11-25 NOTE — Patient Outreach (Addendum)
[place] [facility]) Care Management  11/25/2014  [name] [birth year] [id number]  Received incoming call from patient's spouse. States she is healthcare power of attorney for patient. States patient has Alzheimer's disease and is unable to speak regarding his health concerns. HIPPA verification received.  Spouse/caregiver states has assistance from aid for 2 hrs per day on Monday through Friday. States she has been caring for patient for 7 years. States patient can ambulate but only can walk for short distances. States he has walker with seat and wheelchair to use as needed.  States she takes patient to doctors' appointments at scheduled intervals.  Currently main concern is medication management (states patient is in doughnut hole and she is having difficulty paying for medications) . States she also is under stress taking care of patient but wants to keep him home as long as possible.  States she would like to have [facility] care management services and consents to referral to community care coordinator and pharmacist.   Plan; Send referral to CMA to distribute to community care coordinator and pharmacist. Sherrin Daisy, RN BSN [place] Management Coordinator [facility] Care Management  [phone]

## 2014-11-25 NOTE — Patient Outreach (Signed)
[place] [facility]) Care Management  11/25/2014  [name] 29-Mar-1934 [id number]   Request from Sherrin Daisy, RN to assign Community RN and Pharmacy, assigned [name], RN and Deanne Coffer, PharmD.  Thanks, Ronnell [place]. [place], [place] Assistant Phone: [phone] Fax: [phone]

## 2014-11-25 NOTE — Telephone Encounter (Signed)
Spoke with the pt's spouse  I advised that the pt needs to schedule and keep med cal appt with TP before any samples are given  He cancelled and no showed for his past 2 appts here  She verbalized understanding  Appt was scheduled for 12/02/14 at 3:15  She was advised to have the pt bring all meds to appt

## 2014-11-26 ENCOUNTER — Other Ambulatory Visit: Payer: Self-pay | Admitting: Pharmacist

## 2014-11-26 NOTE — Patient Outreach (Signed)
[place] [facility]) Care Management  [place]   11/26/2014  SARGENT MANKEY [birth year] [id number]  Subjective: [name] is a 80 y.o. male who was referred to [place] for medication assistance. Per patient's wife's report, they are having a difficult time getting the patient's medications.   The patient's wife, [name], is the primary care giver. I was unable to reach her.   Objective:   Current Medications: Current Outpatient Prescriptions  Medication Sig Dispense Refill  . Aclidinium Bromide (TUDORZA PRESSAIR) 400 MCG/ACT AEPB Inhale 1 puff into the lungs 2 (two) times daily. One twice daily    . amLODipine (NORVASC) 10 MG tablet Take 1 tablet (10 mg total) by mouth daily.    . budesonide-formoterol (SYMBICORT) 160-4.5 MCG/ACT inhaler Inhale 2 puffs into the lungs 2 (two) times daily.    . Cholecalciferol (VITAMIN D) 2000 UNITS CAPS Take 2,000 Units by mouth daily.     . digoxin (LANOXIN) 0.125 MG tablet Take 0.125 mg by mouth daily.     Marland Kitchen Fe Cbn-Fe Gluc-FA-B12-C-DSS (FERRALET 90) 90-1 MG TABS Take 1 tablet by mouth daily.    . furosemide (LASIX) 20 MG tablet Take 1 tablet (20 mg total) by mouth daily. 5 tablet 0  . metoprolol tartrate (LOPRESSOR) 25 MG tablet Take 0.5 tablets (12.5 mg total) by mouth 2 (two) times daily. 30 tablet 0  . predniSONE (DELTASONE) 10 MG tablet Take  4 each am x 2 days,   2 each am x 2 days,  1 each am x 2 days and stop 14 tablet 0  . rosuvastatin (CRESTOR) 20 MG tablet Take 20 mg by mouth at bedtime.     . valsartan (DIOVAN) 160 MG tablet Take 160 mg by mouth daily.     No current facility-administered medications for this visit.    Functional Status: In your present state of health, do you have any difficulty performing the following activities: 11/25/2014 12/10/2013  Hearing? Y N  Vision? N N  Difficulty concentrating or making decisions? Tempie Donning  Walking or climbing stairs? Y Y  Dressing or bathing? Y N  Doing errands,  shopping? Tempie Donning    Fall/Depression Screening: No flowsheet data found.  Assessment: 1. Medication assistance: Patient is on a few brand name medications that are very expensive: Tudorza, Symbicort, and Crestor (rosuvastatin generic is still expensive). However, I was unable to to contact the patient for further assessment.   Plan: 1. Medication assistance: I called the patient's wife to discuss medication assistance and I had to leave a HIPPA compliant message for patient to return my phone call.  I will reach out on 12/03/14 if the patient does not return my call today. I also provided the patient with [facility], PharmD's number in case they needed urgent assistance.   Nicoletta Ba, PharmD, [place] [facility] [phone]

## 2014-11-30 ENCOUNTER — Other Ambulatory Visit (HOSPITAL_COMMUNITY): Payer: Medicare HMO

## 2014-11-30 ENCOUNTER — Ambulatory Visit: Payer: Medicare HMO | Admitting: Family

## 2014-11-30 ENCOUNTER — Encounter (HOSPITAL_COMMUNITY): Payer: Medicare HMO

## 2014-12-02 ENCOUNTER — Ambulatory Visit (INDEPENDENT_AMBULATORY_CARE_PROVIDER_SITE_OTHER): Payer: Commercial Managed Care - HMO | Admitting: Internal Medicine

## 2014-12-02 ENCOUNTER — Other Ambulatory Visit: Payer: Self-pay | Admitting: *Deleted

## 2014-12-02 ENCOUNTER — Encounter: Payer: Commercial Managed Care - HMO | Admitting: Adult Health

## 2014-12-02 NOTE — Patient Outreach (Signed)
[facility]) Care Management  12/02/2014  LANDRUM CARBONELL Jan 26, 1935 [id number]   Assessment: Telephone Assessment Call placed to patient on both telephone numbers provided but unable to reach him. HIPAA compliant voice message left with name and contact number.  Plan: Will await return call. If unable to receive call back, will schedule for next outreach call.  Zakirah Weingart A. Mose Colaizzi, BSN, RN-BC [place] Management Coordinator Cell: [phone]

## 2014-12-03 ENCOUNTER — Other Ambulatory Visit: Payer: Self-pay | Admitting: Pharmacist

## 2014-12-03 NOTE — Patient Outreach (Signed)
[place] [facility]) Care Management  [place]   12/03/2014  LAWTON DOLLINGER May 17, 1934 [id number]  Subjective: [name] is a 80 y.o. male who was referred to [place] for medication assistance. Per patient's wife's report, they are having a difficult time getting the patient's medications.   The patient's wife, [name], is the primary care giver. She reported that she has been so worried about affording his medications because this is the first year that he has hit the donut hole. She reports that he currently has all of his medications because their church gave them money to be able to get them all.  She reports that his doctor's office had her call the Symbicort patient assistance program and that they helped her with the application. She is still waiting to receive the medication and is not sure if the doctor's office has sent it back yet.   She reports that he is not taking Tudorza, amlodipine, vitamin D, iron, prednisone, or Crestor due to his advanced Alzheimers.   Upon chart review, patient has missed many appointments with Pulmonary who will not provide samples unless he comes in for a visit.   Objective:   Current Medications: Current Outpatient Prescriptions  Medication Sig Dispense Refill  . budesonide-formoterol (SYMBICORT) 160-4.5 MCG/ACT inhaler Inhale 2 puffs into the lungs 2 (two) times daily.    . digoxin (LANOXIN) 0.125 MG tablet Take 0.125 mg by mouth daily.     . furosemide (LASIX) 20 MG tablet Take 1 tablet (20 mg total) by mouth daily. 5 tablet 0  . metoprolol tartrate (LOPRESSOR) 25 MG tablet Take 0.5 tablets (12.5 mg total) by mouth 2 (two) times daily. 30 tablet 0  . rosuvastatin (CRESTOR) 20 MG tablet Take 20 mg by mouth at bedtime.     . valsartan (DIOVAN) 160 MG tablet Take 160 mg by mouth daily.    . Aclidinium Bromide (TUDORZA PRESSAIR) 400 MCG/ACT AEPB Inhale 1 puff into the lungs 2 (two) times daily. One twice daily (Patient not  taking: Reported on 12/03/2014)    . amLODipine (NORVASC) 10 MG tablet Take 1 tablet (10 mg total) by mouth daily. (Patient not taking: Reported on 12/03/2014)    . Cholecalciferol (VITAMIN D) 2000 UNITS CAPS Take 2,000 Units by mouth daily.     Marland Kitchen Fe Cbn-Fe Gluc-FA-B12-C-DSS (FERRALET 90) 90-1 MG TABS Take 1 tablet by mouth daily.    . predniSONE (DELTASONE) 10 MG tablet Take  4 each am x 2 days,   2 each am x 2 days,  1 each am x 2 days and stop (Patient not taking: Reported on 12/03/2014) 14 tablet 0   No current facility-administered medications for this visit.    Functional Status: In your present state of health, do you have any difficulty performing the following activities: 11/25/2014 12/10/2013  Hearing? Y N  Vision? N N  Difficulty concentrating or making decisions? Tempie Donning  Walking or climbing stairs? Y Y  Dressing or bathing? Y N  Doing errands, shopping? Tempie Donning    Fall/Depression Screening: No flowsheet data found.  Assessment: 1. Medication assistance: Patient is on Symbicort and the rest of the medications he is taking are generic. He is not on many of his medications anymore due to having advanced Alzheimers per her report.   Plan: 1. Medication assistance: Will refer patient's wife, [name], to [place] for [employer] Extra Help application and for assistance with Symbicort application. Patient has my contact information for  any further questions.    Nicoletta Ba, PharmD, [place] [facility] [phone]

## 2014-12-07 ENCOUNTER — Other Ambulatory Visit: Payer: Self-pay | Admitting: *Deleted

## 2014-12-07 NOTE — Patient Outreach (Signed)
[place] [facility]) Care Management  12/07/2014  [name] Mar 14, 1935 [id number]   Assessment: Care coordination call Call placed to patient but unable to reach on home phone number and has a voice mailbox that is full. Called mobile number and was able to talk to wife [name]). Asked to speak to patient and was able to speak to him briefly.  Patient verified his name but unable to remember birth date and address. He states "it's ok" to talk with his wife. Wife informed care management coordinator that her husband has Alzheimer's dementia and will not be able to answer appropriately. Wife then told care management coordinator to call back the next day since she is driving and will not be able to talk and drive at the same time.  Plan: Will attempt to call back tomorrow.  Salman Wellen A. Leylanie Woodmansee, BSN, RN-BC [place] Management Coordinator Cell: [phone]

## 2014-12-08 ENCOUNTER — Other Ambulatory Visit: Payer: Self-pay | Admitting: *Deleted

## 2014-12-08 NOTE — Patient Outreach (Signed)
[facility]) Care Management  12/08/2014  [name] [id number]  Assessment: Care coordination call Call placed to speak to patient's wife [name]) as requested yesterday. Able to speak to wife very briefly and told this care management coordinator that she will call me back shortly. Patient's wife has care coordinator's contact number.   Plan: Will await patient's wife's call. If unable to receive call back, will attempt to call again tomorrow.  Grant Swager [name], BSN, RN-BC [name] Cell: [phone]

## 2014-12-09 ENCOUNTER — Encounter (HOSPITAL_COMMUNITY): Payer: Self-pay

## 2014-12-09 ENCOUNTER — Other Ambulatory Visit: Payer: Self-pay | Admitting: *Deleted

## 2014-12-09 ENCOUNTER — Ambulatory Visit: Payer: Commercial Managed Care - HMO | Admitting: Family

## 2014-12-09 NOTE — Patient Outreach (Signed)
[facility]) Care Management  12/09/2014  [name] 08/15/1934 [id number]   Assessment: Inbound call Return call received from patient's wife for message left earlier. Spoke with patient's wife [name] (primary caregiver). Verified identity. Wife confirms she is aware of care management referral.   Per wife, patient has Alzheimer's dementia and dependent on her- for all of his daily care. "It's like taking care of a 80 year old kid" as stated. An aide comes in 2 hours a day Monday to Friday who assists with patient's bathing, dressing as well as cleaning up his room and ironing.  Wife states that patient feeds himself and has good appetite.  Per wife, she manages patient's medications but gets confused at times with the medications. Since she is not in the medical field, wife verbalize that her main and biggest concerns at this point are getting someone to "teach and assist" her about patient's medications and finding resources" who can help with the cost" of his medications because the "gap" has been reached from August 2016 to January 2017. She states that 7 of patient's medications has to be "purchased at full price because of that gap".  Care management coordinator mentioned to wife that pharmacy referral has already been in place to assist them with those concerns.  Wife mentioned that patient has Respite Care. According to wife, patient is transported by [facility] to [facility] program 4 hours a day, 4 times a week. Wife states they have children but all grown and have their own families and nobody lives close by.  Wife had refused to continue answering questions regarding patient's assessment and states it is "stressing" her out. She agreed to initial home visit next week. Encouraged to call [facility] care management, 24-hour nurse line and care management coordinator as necessary. Contact numbers provided to wife.   Plan: Initial home visit on 12/16/14.  Aesha Agrawal A.  Leandrew Keech, BSN, RN-BC [place] Management Coordinator Cell: [phone]

## 2014-12-09 NOTE — Patient Outreach (Signed)
[place] [facility]) Care Management  12/09/2014  JOSEEDUARDO BRIX 1934-09-04 [id number]   Assessment: Care coordination follow-up Unable to receive a call back from wife as what was told yesterday. Call placed to patient and wife but unable to reach them. HIPAA compliant voice message left with name and contact number.  Plan: Will await for return call.. If unable to receive a call back, will schedule for next care coordination call.  Derrian Poli A. Khiana Camino, BSN, RN-BC [place] Management Coordinator Cell: [phone]

## 2014-12-14 ENCOUNTER — Encounter: Payer: Commercial Managed Care - HMO | Admitting: Adult Health

## 2014-12-16 ENCOUNTER — Other Ambulatory Visit: Payer: Commercial Managed Care - HMO | Admitting: *Deleted

## 2014-12-16 NOTE — Patient Outreach (Signed)
[place] [facility]) Care Management  12/16/2014  NYKOLAS BACALLAO Jul 04, 1934 [id number]   Assessment: Initial Home Visit- No Show Care management coordinator went to patient's home for initial home visit as agreed and scheduled last week. Several attempts done to ring doorbell and knock on door/storm door but no answer. Call placed to patient's home and mobile number but unable to reach patient or wife. HIPAA compliant voice message left with name and contact information. Call place to daughter ([name] Rydell) on both phone numbers provided but unable to reach her. HIPAA compliant voice message left with name and contact number.  Plan: Will await return call from patient, wife [name]) or daughter ([name] Rydell) If unable to receive any call back, will call again and reschedule initial home visit.   Lorraine A. Ajel, BSN, RN-BC [place] Management Coordinator Cell: [phone]

## 2014-12-17 ENCOUNTER — Other Ambulatory Visit: Payer: Self-pay | Admitting: *Deleted

## 2014-12-17 NOTE — Patient Outreach (Signed)
[place] [facility]) Care Management  12/17/2014  [name] 12-Dec-1934 [id number]  Assessment: Care coordination call for initial home visit Call placed to patient's wife in response to message received late afternoon yesterday explaining reason for no show (delayed flight coming back). Identity verified. Wife agreed to reschedule initial home visit next week.  Plan: Initial home visit rescheduled to 12/22/14  [name] A. Ajel, BSN, RN-BC [place] Management Coordinator Cell: [phone]

## 2014-12-22 ENCOUNTER — Other Ambulatory Visit: Payer: Self-pay | Admitting: *Deleted

## 2014-12-22 ENCOUNTER — Encounter: Payer: Self-pay | Admitting: Adult Health

## 2014-12-22 ENCOUNTER — Ambulatory Visit (INDEPENDENT_AMBULATORY_CARE_PROVIDER_SITE_OTHER): Payer: Commercial Managed Care - HMO | Admitting: Adult Health

## 2014-12-22 ENCOUNTER — Encounter: Payer: Self-pay | Admitting: *Deleted

## 2014-12-22 VITALS — BP 136/70 | HR 66 | Temp 97.9°F | Ht 72.0 in | Wt 171.8 lb

## 2014-12-22 VITALS — BP 140/70 | HR 64 | Resp 18

## 2014-12-22 DIAGNOSIS — I10 Essential (primary) hypertension: Secondary | ICD-10-CM | POA: Diagnosis not present

## 2014-12-22 DIAGNOSIS — J449 Chronic obstructive pulmonary disease, unspecified: Secondary | ICD-10-CM

## 2014-12-22 DIAGNOSIS — Z23 Encounter for immunization: Secondary | ICD-10-CM | POA: Diagnosis not present

## 2014-12-22 DIAGNOSIS — F0280 Dementia in other diseases classified elsewhere without behavioral disturbance: Secondary | ICD-10-CM

## 2014-12-22 DIAGNOSIS — G309 Alzheimer's disease, unspecified: Principal | ICD-10-CM

## 2014-12-22 MED ORDER — BUDESONIDE 0.25 MG/2ML IN SUSP: 2.0000 mL | Freq: Two times a day (BID) | RESPIRATORY_TRACT | Status: DC

## 2014-12-22 MED ORDER — ALBUTEROL SULFATE HFA 108 (90 BASE) MCG/ACT IN AERS: 2.0000 | INHALATION_SPRAY | Freq: Four times a day (QID) | RESPIRATORY_TRACT | Status: DC | PRN

## 2014-12-22 MED ORDER — ARFORMOTEROL TARTRATE 15 MCG/2ML IN NEBU: 15.0000 ug | INHALATION_SOLUTION | Freq: Two times a day (BID) | RESPIRATORY_TRACT | Status: DC

## 2014-12-22 NOTE — Assessment & Plan Note (Signed)
HTN -on both ACE and ARB  For now would hold ACE until can see PCP to discuss regimen   Plan  Stop Lisinopril .  Continue on Valsartan 160mg  daily .  Discuss with [name] regarding blood pressure meds .  Follow up in office in 6 months and As needed

## 2014-12-22 NOTE — Progress Notes (Signed)
Subjective:     Patient ID: [name], male   DOB: [birth year]  MRN: [id number]     Brief patient profile:  80  yobm s/p smoking cessation in 1995 previously felt to have COPD  GOLD II criteria with reversibility 01/06/09    HPI January 06, 2009 Followup with PFT's. Pt states that overall breathing has been okay. He gets SOB when he gets in a rush. [name] states that pt gets out of breath just walking from one room to the next. Also he c/o dry cough- especially at night x 4-6 weeks eval by [facility] with cxr and rx cough syrup > no better. no purulent sputum. Try higher strength of Symbicort 160 2 puffs first thing in am and 2 puffs again in pm about 12 hours later and if happy with it fill the prescription.  as long as you are coughing take the zegerid otc at bedtime > seemed to help to pt and wife's satisfaction.      09/01/2013 Acute OV  Complains of increased SOB especially w/ exertion, wheezing, some dry cough x2-3weeks. No fever or discolored mucus  Occurred while at [facility]. No leg swelling, calf pain or edema.  At rest no significant dyspnea. But cant walk 10 feet without severe dyspnea, cough and wheezing.  Today in office with desats in 70% with ambulation.  EKG shows ST depression .  O2 sats at rest 100% on RA  No chest pain, n/v/d, hemoptysis , fever.  Denies f/c/s, hemoptysis, nausea, vomiting. No calf pain rec admit  Admit date: 09/01/2013  Discharge date: 09/07/2013  Recommendations for Outpatient Follow-up:  1. Acute hypoxic respiratory failure, multifactorial, patient newly started on home oxygen 2 L per minute continuous 2. Symptomatic microcytic anemia, status post iron infusion, consider repeat CBC in the next few weeks to assess response to oral iron therapy. 3. Evidence of worsened systolic congestive heart failure currently compensated. Currently euvolemic without diuretics. 4. Resolution of COPD exacerbation and possible community acquired  pneumonia. 5. Metformin has been discontinued secondary to his heart failure, blood sugars have been well controlled during this hospitalization without significant use of insulin or other agents. For now suggest continue monitor blood sugars and hold medication  6.  Discharge Diagnoses:  1. Acute hypoxic respiratory failure 2. Symptomatic microcytic anemia secondary to Candida esophagitis 3. Possible acute on chronic systolic congestive heart failure 4. Candida esophagitis 5. COPD, possible exacerbation 6. Possible [facility] pneumonia 7. Diabetes mellitus type 2 currently diet controlled Discharge Condition: Improved  Disposition: Home with home health RN, PT  Diet recommendation: Heart healthy diabetic diet  Filed Weights    09/05/13 0400  09/06/13 0410  09/07/13 0403   Weight:  74.707 kg (164 lb 11.2 oz)  74.2 kg (163 lb 9.3 oz)  72.7 kg (160 lb 4.4 oz)   History of present illness:  80 year old man presented with progressive dyspnea on exertion for 3 weeks, asymptomatic at rest, hypoxic with ambulation his pulmonologist office. He was sent to the emergency department for further evaluation of hypoxia. Initial evaluation revealed acute respiratory failure with hypoxia, mild CHF , possible exertional angina.  Hospital Course:  [name] was admitted for further evaluation of respiratory failure with hypoxia, abnormal EKG and possible COPD. He was seen in consultation with cardiology, cardiac enzymes were negative and there were no signs or symptoms to suggest acute coronary syndrome. 2-D echocardiogram did reveal worsening of chronic systolic heart failure, cardiology recommended medical management with no  further intervention. His respiratory status has remained stable without evidence of volume overload without diuretic therapy. He is to have microcytic anemia and seen in consultation with gastroenterology, underwent EGD which revealed Candida esophagitis, see full report below. He is  also felt to have a COPD exacerbation and possible pneumonia. With treatment of these issues his condition is improved at the time of discharge she is quite stable but will her car home oxygen. Individual issues as below. Beta blocker was recommended for his heart disease, he appears to be tolerating this well from a pulmonary standpoint.  1. Acute hypoxic respiratory failure. No hypoxia except with ambulation. Multifactorial, favor progression of COPD, possible pneumonia, anemia, systolic heart failure. 2. Symptomatic anemia. EGD revealed Candida esophagitis, gastric ulceration, moderate erosive esophagitis. Anemia felt to be multifactorial but esophagitis likely contributing. Stable. 3. Suspect subacute systolic congestive heart failure on admission. Echocardiogram with significant decrease in left ventricular ejection fraction. Suspect several week history of dyspnea on exertion related to decreased heart function combined with anemia and COPD. Discussed in detail with the patient's cardiologist recommended starting beta blocker, digoxin 4. Candida esophagitis. 5. Elevated troponin on admission. Subsequent troponins negative. No evidence of ACS on admission. 6. COPD possible exacerbation. Appears resolved. 7. Possible Community acquired pneumonia. Appears resolved. Finish Levaquin. 8. Diabetes mellitus type 2. Well controlled. 9. Alzheimer's dementia. Stable, at baseline 10. Tobacco dependence. Recommend cessation. Plan discharge home today with home health RN, PT.  Complete steroid taper, antibiotics  Iron on discharge, will need 7 days of Nystatin. PPI BID for 3 months and then QD. Followup with GI as needed. Consultants:  Cardiology Procedures:  Transfusion one unit packed red blood cells  EGD ENDOSCOPIC IMPRESSION: 1. Candida Esophagitis  2. Moderate Erosive Esophagitis  3. Mild Antral Gastritis  4. Gastric Ulceration  5. Small Hiatal Hernia  6. Minimal Duodenitis  7. Anemia likely  multifactorial but esophagitis likely contributing  to it  Antibiotics:  Levaquin 6/10 >> 6/17 Discharge Instructions  Discharge Instructions    Diet - low sodium heart healthy  Complete by: As directed       Diet Carb Modified  Complete by: As directed       Discharge instructions  Complete by: As directed    Call physician or seek immediate medical attention for increased shortness of breath, chest pain, bleeding or worsening in condition. Continue to check blood sugars every day. Metformin has been discontinued because of your heart weakness. However your blood sugars have been well controlled during her hospital stay. Call your physician for blood sugars greater than 400 or less than 70.     Increase activity slowly  Complete by: As directed              Medication List     STOP taking these medications       clopidogrel 75 MG tablet    Commonly known as: PLAVIX    metFORMIN 500 MG tablet    Commonly known as: GLUCOPHAGE     TAKE these medications       albuterol (2.5 MG/3ML) 0.083% nebulizer solution    Commonly known as: PROVENTIL    Take 2.5 mg by nebulization every 6 (six) hours as needed for wheezing or shortness of breath.    aspirin 81 MG EC tablet    Take 1 tablet (81 mg total) by mouth daily.    budesonide-formoterol 160-4.5 MCG/ACT inhaler    Commonly known as: SYMBICORT    Inhale 2 puffs  into the lungs 2 (two) times daily.    digoxin 0.125 MG tablet    Commonly known as: LANOXIN    Take 1 tablet (0.125 mg total) by mouth daily.    ferrous sulfate 325 (65 FE) MG EC tablet    Take 1 tablet (325 mg total) by mouth daily with breakfast.    levofloxacin 500 MG tablet    Commonly known as: LEVAQUIN    Take 1 tablet (500 mg total) by mouth daily. Start 6/16 in the morning.    metoprolol tartrate 25 MG tablet    Commonly known as: LOPRESSOR    Take 0.5 tablets (12.5 mg total) by mouth 2 (two) times daily.    nystatin 100000 UNIT/ML suspension    Commonly known  as: MYCOSTATIN    Take 5 mLs (500,000 Units total) by mouth 4 (four) times daily.    olmesartan 40 MG tablet    Commonly known as: BENICAR    Take 40 mg by mouth daily.    pantoprazole 40 MG tablet    Commonly known as: PROTONIX    Take 1 tablet (40 mg total) by mouth 2 (two) times daily.    predniSONE 10 MG tablet    Commonly known as: DELTASONE    Start 6/16 in AM. Take 20 mg by mouth daily for 3 days. Then take 10 mg by mouth daily for 3 days. Then stop.    rosuvastatin 20 MG tablet    Commonly known as: CRESTOR    Take 20 mg by mouth at bedtime.    Vitamin D 2000 UNITS Caps    Take 1 capsule by mouth at bedtime.      09/23/2013 post f/u ov/extensive ov at transition of care/[facility] re: 02 24/7 at 2lpm Chief Complaint  Patient presents with  . HFU    Pt states breathing is some better, but not back to baseline. He c/o increased cough that started before he was d/c'ed- cough is non prod.   no worse since finished prednisone  Now on 02 continuously  @ 2lpm  Cough is mostly daytime, not assoc with meals Confused with meds rec Work on inhaler technique:     Only use your albuterol  as a rescue medication     06/04/2014 f/u ov/[facility] re: COPD II    Chief Complaint  Patient presents with  . Acute Visit    Pt c/o increased SOB for the past 2 days.  He is SOB with exertion and spouse states he is gasping for air in his sleep.  He also c/o cough more than usual- occ prod with minimal clear sputum.    maint rx = symbicort 160 2bid   Has nebulizer but rarely uses >>pred taper and start on tudorza   12/22/2014 Follow up : [facility] /COPD GOLD II  Pt returns for 6 month follow up .  He is accompanied by his wife.  Pt has moderate to severe dementia.  Wife answers most of his questions , she is his caregiver.  We reviewed his meds and organized them into a med calendar . Wife says he has hit the doughnut hole and it is hard to afford his inhalers. We discussed changing to Nebs as it is also  hard to coordinate MDI use .  He is taking lisinopril and divoan, it appears to have been prescribed by two different providers. Suggested he stop ACE Inhibitor. Continue on Diovan and discuss regimen with PCP .  Overall says his breathing  has been good with no flare of cough or dyspnea. No ER /hosp visits since last ov .  No fever, chest pain, orthopnea or edema.   She was somewhat upset because she has a lot of responsibiltiy with his care and they missed an appointment here because he took the phone call for the ov and forgot to tell her. .  Support provided. Finances are also tough.   Needs flu shot today . Prevnar and Pneumovax utd.   According to review of hospital records he was admitted in 11/2013 with PNA and DVT . Tx w/ Xarelto ? For 6 months as he is no longer taking.     Current Medications, Allergies, Complete Past Medical History, Past Surgical History, Family History, and Social History were reviewed in [employer] record.  ROS  The following are not active complaints unless bolded sore throat, dysphagia, dental problems, itching, sneezing,  nasal congestion or excess/ purulent secretions, ear ache,   fever, chills, sweats, unintended wt loss, pleuritic or exertional cp, hemoptysis,  orthopnea pnd or leg swelling, presyncope, palpitations, heartburn, abdominal pain, anorexia, nausea, vomiting, diarrhea  or change in bowel or urinary habits, change in stools or urine, dysuria,hematuria,  rash, arthralgias, visual complaints, headache, numbness weakness or ataxia or problems with walking or coordination,  change in mood/affect or memory.                  Past Medical History:  HYPERLIPIDEMIA (ICD-272.4)  PVD (ICD-443.9)  HYPERTENSION NEC  DYSPNEA (ICD-786.05)  COPD  PFT's 11/18/00 FEV1 is 1.94 or 60%  - PFT's 01/06/09 FEV1 1.75 (57%) ratio 49 and 21 % better FVC after B2 DLC0 71  - HFA 90% better p coaching 01/23/10  -med calendar 12/22/2014              Objective:   Physical Exam   elderly black male ambulatory  Vitals signs reviewed   Wt  187 January 06, 2009 > 186 01/23/10 > 05/04/2011  191 > 06/16/2013  170 >169 09/01/2013 >   169 09/23/2013 > 06/04/2014  181 >171 12/22/2014   HEENT mild turbinate edema. Edentulous,  Oropharynx no thrush or excess pnd or cobblestoning. No JVD or cervical adenopathy. Mild accessory muscle hypertrophy. Trachea midline, nl thryroid. Chest was hyperinflated by percussion with diminished breath sounds   Regular rate and rhythm without murmur gallop or rub or increase P2. No edema , neg homans sign Abd: no hsm, nl excursion. Ext warm without cyanosis or clubbing. Pleasant but  confused      . CXR PA and Lateral:   06/04/2014 :       Interim near complete clearing of left lower lobe infiltrate .     Labs ordered/ reviewed    Lab 06/04/14 1004  NA 139  K 4.0  CL 103  CO2 31  BUN 12  CREATININE 1.14  GLUCOSE 100*    Recent Labs Lab 06/04/14 1004  HGB 14.0  HCT 42.7  WBC 7.9  PLT 239.0     Lab Results  Component Value Date   TSH 0.94 06/04/2014     Lab Results  Component Value Date   PROBNP 160.0* 06/04/2014           Assessment:

## 2014-12-22 NOTE — Addendum Note (Signed)
Addended by: Levander Campion on: 12/22/2014 11:13 AM   Modules accepted: Orders

## 2014-12-22 NOTE — Patient Outreach (Signed)
[facility]) Care Management  12/22/2014  [name] [birth year] [id number]   Notification from [name], RN to assign SW, assigned [employer], LCSW.  Thanks, Ronnell Freshwater. [place], [place] Assistant Phone: [phone] Fax: [phone]

## 2014-12-22 NOTE — Addendum Note (Signed)
Addended by: Osa Craver on: 12/22/2014 10:36 AM   Modules accepted: Orders, Medications

## 2014-12-22 NOTE — Assessment & Plan Note (Signed)
Controlled on rx however financial issues and dementia are barriers  Will try to change to Brovana and Budesonide nebs   Plan  Flu shot today .  Finish Symbicort inhaler then  Begin Pulmicort and [employer] Twice daily   Follow up in office in 6 months and As needed

## 2014-12-22 NOTE — Patient Outreach (Signed)
[facility]) Care Management   12/22/2014  [name] 1934/05/10 [id number]  [name] is an 80 y.o. male  Subjective: Arrived at patient's home, noted patient sitting on the driver's side of their car with door open. Greeted patient and introduced self and he replied, "Hello stranger". Patient escorted care coordinator into their house and called his wife. Patient states "everything is doing well". Wife reports that they just got home from pulmonologist appointment but patient refused to get in the house. Wife states patient usually does that so she lets him be, then she has to come back to get him until he is ready to get in. Wife reports if you insist and he is not ready, then he will be aggravated and shows a behavior.  Objective: BP 140/70 mmHg  Pulse 64  Resp 18  SpO2 98%   Review of Systems  Constitutional: Negative.   Eyes:       Wears eyeglasses  Respiratory: Positive for cough. Negative for wheezing.        Gets short of breath when walking and exerting effort per wife. "smoker's cough"  wet non productive coughing noted  Cardiovascular: Negative for leg swelling.       Regular rate and rhythm  Gastrointestinal: Negative.        Abdomen soft  Bowel sounds present  Genitourinary:       Wears depends for urinary incontinence  Musculoskeletal: Negative for falls.  Skin:       Noted bruised spot to patient's left shin  Neurological: Negative.   Psychiatric/Behavioral: Positive for memory loss.       Alzheimer's dementia    Physical Exam  Current Medications:   Current Outpatient Prescriptions  Medication Sig Dispense Refill  . aspirin 81 MG tablet Take 81 mg by mouth daily.    . Aclidinium Bromide (TUDORZA PRESSAIR) 400 MCG/ACT AEPB Inhale 1 puff into the lungs 2 (two) times daily. One twice daily (Patient not taking: Reported on 12/03/2014)    . albuterol (PROVENTIL HFA;VENTOLIN HFA) 108 (90 BASE) MCG/ACT inhaler Inhale 2 puffs into the  lungs every 6 (six) hours as needed for shortness of breath. (Patient not taking: Reported on 12/22/2014) 1 Inhaler 5  . amLODipine (NORVASC) 10 MG tablet Take 1 tablet (10 mg total) by mouth daily. (Patient not taking: Reported on 12/03/2014)    . arformoterol (BROVANA) 15 MCG/2ML NEBU Take 2 mLs (15 mcg total) by nebulization 2 (two) times daily. 120 mL 1  . budesonide (PULMICORT) 0.25 MG/2ML nebulizer solution Take 2 mLs (0.25 mg total) by nebulization 2 (two) times daily. 120 mL 2  . budesonide-formoterol (SYMBICORT) 160-4.5 MCG/ACT inhaler Inhale 2 puffs into the lungs 2 (two) times daily.    . Cholecalciferol (VITAMIN D) 2000 UNITS CAPS Take 2,000 Units by mouth daily.     . digoxin (LANOXIN) 0.125 MG tablet Take 0.125 mg by mouth daily.     Marland Kitchen Fe Cbn-Fe Gluc-FA-B12-C-DSS (FERRALET 90) 90-1 MG TABS Take 1 tablet by mouth daily.    . furosemide (LASIX) 20 MG tablet Take 1 tablet (20 mg total) by mouth daily. 5 tablet 0  . metoprolol tartrate (LOPRESSOR) 25 MG tablet Take 0.5 tablets (12.5 mg total) by mouth 2 (two) times daily. 30 tablet 0  . predniSONE (DELTASONE) 10 MG tablet Take  4 each am x 2 days,   2 each am x 2 days,  1 each am x 2 days and stop (Patient not taking: Reported  on 12/03/2014) 14 tablet 0  . rosuvastatin (CRESTOR) 20 MG tablet Take 20 mg by mouth at bedtime.     . valsartan (DIOVAN) 160 MG tablet Take 160 mg by mouth daily.     No current facility-administered medications for this visit.    Functional Status:   In your present state of health, do you have any difficulty performing the following activities: 12/22/2014 12/09/2014  Hearing? Teresita? N -  Difficulty concentrating or making decisions? Y -  Walking or climbing stairs? N -  Dressing or bathing? Y -  Doing errands, shopping? Y -  Conservation officer, nature and eating ? - Y  Using the Toilet? - Y  In the past six months, have you accidently leaked urine? - Y  Do you have problems with loss of bowel control? - Y   Managing your Medications? - Y  Managing your Finances? - Y  Housekeeping or managing your Housekeeping? - Y    Fall/Depression Screening:    No flowsheet data found.  Assessment:   80 year old male with diagnoses of Alzheimer's dementia - stable; COPD; hypertension; heart failure and type 2 diabetes (well controlled and not taking any medications). Referral received from telephonic care management coordinator Halford Decamp) for medication management (difficulty paying medications) and wife under stress taking care of patient but wanting to keep him at home as long as possible.  According to wife, patient's Alzheimer's dementia made patient dependent on her- for all of his daily care. She states "it's like taking care of a 68 year old kid". She reports that at times husband keeps her awake all night when he wakes up at wee hours in the morning to take a shower and dress up thinking it's time to prepare and go to work" and wife can not get back to sleep.  Patient also urinates or defecates on the floor thinking that he is in the bathroom and wife "ends up cleaning after his mess".  She reports that it is starting to affect her health and well-being. Wife agreed to [facility] social worker referral and expressed her desire for placement for patient since it is getting to the point that she is overwhelmed in taking care of him, as stated.   Wife reports that a sitter/aide comes in 2 hours a day Monday to Friday at 7:30 am to 9:00 am to assist patient with bathing, dressing, toiletting, grooming as well as cleaning up his room and ironing. Aide assists wife in preparing patient before he goes to Adult day care ([place]).  Per wife report, patient is transported by [facility] 4 hours a day, 4 times a week. Wife states they have 3 children but all grown and have their own families. The closest daughter lives in [place] and she calls several times a day to check on  them. Wife reports that patient can feed himself and has good appetite. He is able to ambulate short distances. He uses walker with a seat to support him for longer distance ambulation. Patient also has a wheelchair to use when needed.  Wife manages patient's medications but states she gets confused at times since she is not in the medical field. She states that she needs "teaching and assistance" with his medications. Wife also verbalize that one of her concerns at this point is "finding resources as to who can help with the cost" of his medications because they have reached the "donut hole" from  August 2016 to January 2017. She states that 7 of patient's medications have to be "purchased at full price because of that gap" and they could not afford it.Reminded patient's wife that [facility] pharmacist Dewaine Oats) had already started reaching out to her. Care coordination was made to [facility] pharmacist Ernst Bowler) on behalf of (S.Karl) to relay wife's concerns and to inform that wife reports not receiving the Medicare for Extra Help application that was mentioned to her before. [facility] pharmacist Ernst Bowler) will follow-up regarding the application form as stated. Care management coordinator encourage patient's wife to get in touch with pharmacist to follow-up and get assistance with the above stated concerns. Medications reviewed with patient's wife with noted of pill box use. Although patient was missing Tudorza inhaler, Albuterol inhaler and Crestor. Wife reports patient not taking these medications. Care coordinator added Aspirin to patient's medication list since wife states he is taking 81 mg daily.  Discussed with patient's wife regarding instructions given at pulmonologist (Dr. Melvyn Novas) visit today as follows: Stop Lisinopril, continue with Valsartan 160 mg daily; discuss with primary care provider about blood pressure medicines; finish Symbicort inhaler then begin Pulmicort and Brovana nebulizer twice a day; follow -up  in 6 months or as needed. Patient had his flu shot today as well. Encourage wife to make a note to discuss blood pressure medications with [name] (PCP) on next scheduled appointment on 10/5 and reminded wife to call [facility] to service patient's nebulizing machine. Wife agreed to it.  Wife reports that patient also has a scheduled appointment with cardiologist (Dr. Einar Gip) on 9/29. She provides transportation to all patient's follow-up appointments. Discussed with wife regarding importance of attending follow-up appointments with his doctors.  Emphasized with wife regarding adherence to medication since she mentioned of forgetting to administer Symbicort inhaler at night and will notice patient having unusual breathing. She states giving his medications (inhalers) as soon as she remembers. According to wife, when patient starts to have wheezing, she "gives Albuterol inhaler that makes him get back on track".  Wife reports that patient "gets short of breath when walking to the mailbox, and he stops on the way back to rest before he proceeds". Wife denies need for oxygen.  THN  COPD packet, COPD action plan, COPD zone tool and THN calendar provided and discussed with wife. Encouraged wife to read COPD informations and ask questions if any. Signs and symptoms of COPD flare-up and when to call the doctor were also discussed with patient's wife.   Wife agreed to routine home visit next month. Encouraged to call [facility] care management, 24-hour nurse line and care management coordinator as necessary. Contact numbers provided to wife    Plan: Routine home visit on 01/13/15. Will refer to [facility] social worker.   THN CM Care Plan Problem One        Most Recent Value   Care Plan Problem One  knowledge deficit regarding COPD   Role Documenting the Problem One  Care Management Coordinator   Care Plan for Problem One  Active   THN Long Term Goal (31-90 days)  Patient's wife will be able to verbalize  at least 3 strategies in managing COPD in the next 31 days   THN Long Term Goal Start Date  12/22/14   Interventions for Problem One Long Term Goal  initial home visit done,  [facility] COPD packet provided,  discussed COPD zone tool,  discussed COPD action plan,  encourage medication adherence,  discuss importance in attending  follow-up doctor's' appointments,  discuss avoidance of environmental irritants,  discuss regarding breathing treatments like inhalers, nebulization and rescue inhalers,  confirmed understanding of pulmonologists instruction regarding medication adjustments and changes    THN CM Short Term Goal #1 (0-30 days)  patient's wife will be able to identify the 3 different COPD zones in the next 30 days   THN CM Short Term Goal #1 Start Date  12/22/14   Interventions for Short Term Goal #1  initial home visit done, provide [facility] calendar and [facility] COPD packet of information,  explained the 3 different COPD zones,  explained the signs and symptoms for each zone,  encourage to evaluate patient's daily status using the COPD zone tool    THN CM Short Term Goal #2 (0-30 days)  patient's wife will verbalize COPD action plan in the next 30 days   THN CM Short Term Goal #2 Start Date  12/22/14   Interventions for Short Term Goal #2  home visit done,  provide [facility] COPD packet of information,  explained COPD action plan,  provided COPD action plan magnet on patient's refrigerator to refer to,  explain COPD signs and symptoms and when to call the doctor for help    THN CM Short Term Goal #3 (0-30 days)  patient's wfe will identify at least 3 signs and symptoms of COPD flare-up and when to call the doctor in the next 30 days   THN CM Short Term Goal #3 Start Date  12/22/14   Interventions for Short Tern Goal #3  home visit made,  [facility] COPD packet of information provided,  explained the signs and symptoms of COPD exacerbation and when to call the doctor for help,  explained use of COPD action plan if patient starts  to manifest signd and symptoms of COPD  flare-up,  encourage to evaluate patient's daily condition using COPD zone tool,  encourage wife to read COPD packet of information and ask questions if any      Edwena Felty A. Dujuan Stankowski, BSN, RN-BC [place] Management Coordinator Cell: [phone]

## 2014-12-22 NOTE — Progress Notes (Signed)
Chart and office note reviewed in detail  > agree with a/p as outlined    

## 2014-12-22 NOTE — Patient Instructions (Addendum)
Flu shot today .  Finish Symbicort inhaler then  Begin Pulmicort and [employer] Twice daily   Stop Lisinopril .  Continue on Valsartan 160mg  daily .  Discuss with [name] regarding blood pressure meds .  Follow up in office in 6 months and As needed

## 2014-12-23 ENCOUNTER — Encounter: Payer: Self-pay | Admitting: *Deleted

## 2014-12-24 ENCOUNTER — Other Ambulatory Visit: Payer: Self-pay | Admitting: Pharmacist

## 2014-12-24 NOTE — Patient Outreach (Signed)
[facility]) Care Management  [place]   12/24/2014  BARLOW Hennon 04-22-1934 [id number]  [name] is a 80 yo male referred to [place] for medication assistance.  Patient's wife reported difficulty paying for medications, and patient was referred to [facility] care management assistant Damita Rhodie for assistance with Extra Help application.  Patient was seen by [facility] CM RN, [name], yesterday and patient's wife reported "Wife manages patient's medications but states she gets confused at times since she is not in the medical field. She states that she needs "teaching and assistance" with his medications.  Wife also verbalize that one of her concerns at this point is "finding resources as to who can help with the cost" of his medications because they have reached the "donut hole" from August 2016 to January 2017. She states that 7 of patient's medications have to be "purchased at full price because of that gap" and they could not afford it."  I called patient's wife today.  She denies any acute pharmacy needs at this time.  I set up initial home visit for Monday, October 3rd at 2:30PM.    [name], Pharm.D. Pharmacy Resident [place] [phone]

## 2014-12-25 ENCOUNTER — Encounter: Payer: Self-pay | Admitting: *Deleted

## 2014-12-27 ENCOUNTER — Encounter: Payer: Self-pay | Admitting: Pharmacist

## 2014-12-27 ENCOUNTER — Other Ambulatory Visit: Payer: Self-pay | Admitting: Pharmacist

## 2014-12-27 ENCOUNTER — Encounter: Payer: Self-pay | Admitting: Family

## 2014-12-27 ENCOUNTER — Encounter: Payer: Self-pay | Admitting: *Deleted

## 2014-12-27 ENCOUNTER — Other Ambulatory Visit: Payer: Self-pay | Admitting: *Deleted

## 2014-12-27 NOTE — Patient Outreach (Signed)
[facility]) Care Management  [place]   12/27/2014  QUINTRELL BAZE [birth year] [id number]  Subjective: [name] is a 80 yo male referred to [place] for medication assistance.  Patient has dementia and has agreed to let [facility] speak to [name], his wife/primary care giver. At home visit by [facility] CMRN, [name], patient's wife reported that she manages the patient's medications but she gets confused at times since she is not in the medical field.  She was also concerned about the cost of his medications as patient is in the donut hole.  I made initial home visit today to review medications with patient's wife.  Patient has an upcoming appointment with his primary care provider on 12/29/14.    [name] has all medications available for review when I arrived.  She uses a pill box to keep medications organized.  [name] states that Mr. Freeney often asks her what his medications are for, and she has a hard time remembering the indication for each medication.    Per review of patient's medications and report from [facility], patient has been switched from rosuvastatin to pravastatin and from valsartan to lisinopril to help with medication cost.  Patient's wife reports that he has had a dry cough since switching to lisinopril though, and they want to switch back to valsartan if possible.  [name] requests information about programs to help with cost of valsartan if patient is switched back.    Per pulmonology note on 12/22/14, patient was switched from Symbicort to [facility] and budesonide nebulizers.  Per note, patient is controlled on rx (Symbicort) however financial issues and dementia are barriers.  Patient was given a sample of Symbicort inhaler and instructed to finish the Symbicort inhaler, but will need assistance for pulmonary medications once the sample is gone.  Patient's wife reports she looked into the price of the nebulizer treatments and states they were more expensive  than the Symbicort inhaler.  Since he is currently controlled on Symbicort, she is interested in continuing Symbicort and plans to talk to patient's doctor about staying on Symbicort if possible.  She said she previously learned about patient assistance for Symbicort and requests more information.    Discussed Extra Help with patient's wife, and she states they would not qualify for Extra Help.    Objective:   Current Medications: Current Outpatient Prescriptions  Medication Sig Dispense Refill  . albuterol (PROVENTIL HFA;VENTOLIN HFA) 108 (90 BASE) MCG/ACT inhaler Inhale 2 puffs into the lungs every 6 (six) hours as needed for shortness of breath. 1 Inhaler 5  . aspirin 81 MG tablet Take 81 mg by mouth daily.    . budesonide-formoterol (SYMBICORT) 160-4.5 MCG/ACT inhaler Inhale 2 puffs into the lungs 2 (two) times daily.    . Cholecalciferol (VITAMIN D) 2000 UNITS CAPS Take 2,000 Units by mouth daily.     . digoxin (LANOXIN) 0.125 MG tablet Take 0.125 mg by mouth daily.     . furosemide (LASIX) 20 MG tablet Take 1 tablet (20 mg total) by mouth daily. 5 tablet 0  . lisinopril (PRINIVIL,ZESTRIL) 20 MG tablet Take 20 mg by mouth daily.    . metoprolol tartrate (LOPRESSOR) 25 MG tablet Take 0.5 tablets (12.5 mg total) by mouth 2 (two) times daily. 30 tablet 0  . pravastatin (PRAVACHOL) 40 MG tablet Take 40 mg by mouth daily.    . Aclidinium Bromide (TUDORZA PRESSAIR) 400 MCG/ACT AEPB Inhale 1 puff into the lungs 2 (two) times  daily. One twice daily (Patient not taking: Reported on 12/03/2014)    . amLODipine (NORVASC) 10 MG tablet Take 1 tablet (10 mg total) by mouth daily. (Patient not taking: Reported on 12/03/2014)    . arformoterol (BROVANA) 15 MCG/2ML NEBU Take 2 mLs (15 mcg total) by nebulization 2 (two) times daily. (Patient not taking: Reported on 12/27/2014) 120 mL 1  . budesonide (PULMICORT) 0.25 MG/2ML nebulizer solution Take 2 mLs (0.25 mg total) by nebulization 2 (two) times daily. (Patient  not taking: Reported on 12/27/2014) 120 mL 2  . Fe Cbn-Fe Gluc-FA-B12-C-DSS (FERRALET 90) 90-1 MG TABS Take 1 tablet by mouth daily.    . predniSONE (DELTASONE) 10 MG tablet Take  4 each am x 2 days,   2 each am x 2 days,  1 each am x 2 days and stop (Patient not taking: Reported on 12/03/2014) 14 tablet 0  . rosuvastatin (CRESTOR) 20 MG tablet Take 20 mg by mouth at bedtime.     . valsartan (DIOVAN) 160 MG tablet Take 160 mg by mouth daily.     No current facility-administered medications for this visit.    Functional Status: In your present state of health, do you have any difficulty performing the following activities: 12/27/2014 12/22/2014  Hearing? Tempie Donning  Vision? N N  Difficulty concentrating or making decisions? Tempie Donning  Walking or climbing stairs? Y N  Dressing or bathing? Y Y  Doing errands, shopping? Tempie Donning  Preparing Food and eating ? Y -  Using the Toilet? Y -  In the past six months, have you accidently leaked urine? Y -  Do you have problems with loss of bowel control? Y -  Managing your Medications? Y -  Managing your Finances? Y -  Housekeeping or managing your Housekeeping? Y -    Fall/Depression Screening: PHQ 2/9 Scores 12/27/2014 12/22/2014  PHQ - 2 Score 1 1  PHQ- 9 Score 4 3    Assessment: 1.  Medication assistance:  Difficulty affording pulmonary medications and valsartan.  [name] requested information on patient assistance programs.  Valsartan does not have a patient assistance program available, but patient may qualify to get valsartan through the [employer] foundation.  Patient may also benefit from enrollment in [facility] mail order pharmacy as tier 1 prescriptions would be free.  [name] states they are already enrolled in mail order pharmacy and sometimes use it for their medications.   Plan: 1.  Provided a list of medications with the indication listed.  2.  Will contact Dr. Melvyn Novas to discuss wife's preference to continue Symbicort at this time.  Provided patient assistance form for  Symbicort per request (section of form will need to be filled out by prescriber).   3.  Will alert [name] about patient's dry cough with lisinopril.  Provided information on how to apply for the [employer] foundation for valsartan.  Patient will talk to [name] about switching from lisinopril back to valsartan at appointment on 12/29/14.     4.  Will follow up with patient in 1 week.    [facility] CM Care Plan Problem One        Most Recent Value   Care Plan Problem One  Patient unable to afford Symbicort   Role Documenting the Problem One  Clinical Pharmacist   Care Plan for Problem One  Active   THN Long Term Goal (31-90 days)  Patient will submit application for Symbicort patient assistance in the next 30 days per patient report.  THN Long Term Goal Start Date  12/27/14   Interventions for Problem One Long Term Goal  Provided patient with Symbicort application.       [name], Pharm.D. Pharmacy Resident [place] [phone]

## 2014-12-27 NOTE — Addendum Note (Signed)
Addended by: Osa Craver on: 12/27/2014 05:52 PM   Modules accepted: Orders, Medications

## 2014-12-27 NOTE — Patient Outreach (Signed)
[name]) Care [name]  [name] Social Work  12/27/2014  [name] 10/02/1934 [id number]  Subjective:    "I might could use some resources for placement for future reference, but plan to try and keep my husband at home with me for as long as possible because I know this is what he would want".  Objective:   CSW agreed to provide patient's wife, [name] with a list of skilled nursing facilities that offer assistance with Alzheimer's/Dementia patient's, for future reference.  Current Medications:  Current Outpatient Prescriptions  Medication Sig Dispense Refill  . Aclidinium Bromide (TUDORZA PRESSAIR) 400 MCG/ACT AEPB Inhale 1 puff into the lungs 2 (two) times daily. One twice daily (Patient not taking: Reported on 12/03/2014)    . albuterol (PROVENTIL HFA;VENTOLIN HFA) 108 (90 BASE) MCG/ACT inhaler Inhale 2 puffs into the lungs every 6 (six) hours as needed for shortness of breath. (Patient not taking: Reported on 12/22/2014) 1 Inhaler 5  . amLODipine (NORVASC) 10 MG tablet Take 1 tablet (10 mg total) by mouth daily. (Patient not taking: Reported on 12/03/2014)    . arformoterol (BROVANA) 15 MCG/2ML NEBU Take 2 mLs (15 mcg total) by nebulization 2 (two) times daily. 120 mL 1  . aspirin 81 MG tablet Take 81 mg by mouth daily.    . budesonide (PULMICORT) 0.25 MG/2ML nebulizer solution Take 2 mLs (0.25 mg total) by nebulization 2 (two) times daily. 120 mL 2  . budesonide-formoterol (SYMBICORT) 160-4.5 MCG/ACT inhaler Inhale 2 puffs into the lungs 2 (two) times daily.    . Cholecalciferol (VITAMIN D) 2000 UNITS CAPS Take 2,000 Units by mouth daily.     . digoxin (LANOXIN) 0.125 MG tablet Take 0.125 mg by mouth daily.     Marland Kitchen Fe Cbn-Fe Gluc-FA-B12-C-DSS (FERRALET 90) 90-1 MG TABS Take 1 tablet by mouth daily.    . furosemide (LASIX) 20 MG tablet Take 1 tablet (20 mg total) by mouth daily. 5 tablet 0  . metoprolol tartrate (LOPRESSOR) 25 MG tablet Take 0.5 tablets  (12.5 mg total) by mouth 2 (two) times daily. 30 tablet 0  . predniSONE (DELTASONE) 10 MG tablet Take  4 each am x 2 days,   2 each am x 2 days,  1 each am x 2 days and stop (Patient not taking: Reported on 12/03/2014) 14 tablet 0  . rosuvastatin (CRESTOR) 20 MG tablet Take 20 mg by mouth at bedtime.     . valsartan (DIOVAN) 160 MG tablet Take 160 mg by mouth daily.     No current facility-administered medications for this visit.    Functional Status:  In your present state of health, do you have any difficulty performing the following activities: 12/22/2014 12/09/2014  Hearing? [place]? N -  Difficulty concentrating or making decisions? Y -  Walking or climbing stairs? N -  Dressing or bathing? Y -  Doing errands, shopping? Y -  Conservation officer, nature and eating ? - Y  Using the Toilet? - Y  In the past six months, have you accidently leaked urine? - Y  Do you have problems with loss of bowel control? - Y  Managing your Medications? - Y  Managing your Finances? - Y  Housekeeping or managing your Housekeeping? - Y    Fall/Depression Screening:  PHQ 2/9 Scores 12/22/2014  PHQ - 2 Score 1  PHQ- 9 Score 3    Assessment:   CSW received a new referral on patient from patient's RNCM with Edwin  [name]) Care [name], [name] reporting that patient and wife, [name] are currently in need of social work services and resources through [name].  More specifically, [name] reported that [name] is requesting assistance with completion of patient's Advanced Directives ([name]).  In addition, [name] is wanting resource information to provide her with a few hours of respite care per day, as well as provide patient with stimulation and social interaction.  Last, [name] would like resource information for transportation services that can provide transportation assistance for patient to and from his physician  appointments. CSW was able to make initial contact with patient's wife, [name] today to perform the initial phone assessment, as well as assess and assist with social work needs and services for patient.  [name] agreed to receive social work services through [name], satisfying two HIPAA complaint identifiers for patient, which included patient's name and date of birth.  Patient has been diagnosed with Alzheimer's/Dementia; therefore, patient lacks capacity to answer assessment questions appropriately and/or make appropriate decisions for himself.  [name] reports that she is patient's primary caregiver, as well as patient's Press photographer. With that being said, CSW inquired about [name] request to complete patient's Advanced Directives ([name]).  [name] denied making this request, reporting that she and patient already have these [name] in place.  CSW encouraged [name] to provide patient's Primary Care Physician, [name] with a copy of patient's completed and signed Advanced Directives, explaining the importance of having these [name] in place.  [name] voiced understanding and agreed to provide [name] with a copy during the next scheduled office visit.  [name] went on to say that she and patient have already discussed code status with [name], agreeing that patient is a "No Code". CSW then explained that CSW could provide [name] with a list of community agencies and resources that offer respite care/socialization activities for patient during the day.  CSW went on to talk about the [name], Edwin ([name] [place] [facility]) and adult day care activities offered at [employer] [facility] [place].  [name] indicated that she is already familiar with all of these programs, as she currently  has patient enrolled in the [facility] [name] several days per week.  [name] is not interested in increasing patient's hours of activity per day, reporting that patient would not be able to tolerate the added stimulation.  Mrs. Frei also reported that she has hired Education officer, community, five days per week, to assist patient with activities of daily living. Mrs. Raschke admitted that it is becoming increasingly difficult for her to care for patient at home, but that she would like to continue to try, for as long as physically possible.  Mrs. Veasey is aware that patient would become increasing confused if taken out of his home environment, only exacerbating his Alzheimer's/Dementia symptoms.  Mrs. Antilla did request resource information for future reference.  CSW agreed to mail Mrs. Peral a Insurance underwriter, which will include all of the following information: List of Assisted Living Facilities in [place], [place], [place], [place] and [facility], offering care and supervision for Alzheimer's/Dementia patient's. List of Skilled Nursing Facilities in [place], [place], [place], [place], [place] and [facility], offering care and supervision for Alzheimer's/Dementia patient's. Last, CSW spoke with  Mrs. Shober about transportation resources to assist patient with transportation to and from his physician appointments.  Mrs. Faria reported that she is currently not interested in transportation resources, as patient would not be capable of utilizing public transportation through [employer] Engineering geologist), [place] ([place]), Armed forces technical officer through [employer] [name] and/or My Appointmate, due to patient's memory loss and severe confusion.  Mrs. Osmond agreed to continue to transport patient to and from his appointments.  No additional social work needs identified at this time.  Therefore,  CSW agreed to follow-up with Mrs. Aline Brochure on October 5th to ensure that she received the packet of information that CSW mailed to her home.  Plan:   CSW will converse with patient's RNCM with [place] [name], [name] to report findings of initial phone conversation with patient's wife, Joann Kulpa today. CSW will fax a barriers letter and correspondence letter to patient's Primary Care Physician, [name]. CSW will contact patient's wife, Jhayden Demuro on October 5th to follow-up regarding social work services and resources, as well as to ensure that she received the packet of information that CSW mailed to her home. CSW will prescribe and print EMMI information to mail to patient's home for patient's wife, [name] [place]'s review.  [name], BSW, MSW, LCSW  Licensed Education officer, environmental Health System  Mailing [place] [address] [place], [place] [address] Physical Address-[address], [place],  [address] Toll Free Main # [phone] Fax # [phone] Cell # [phone]  Fax # [phone]  [name]

## 2014-12-28 ENCOUNTER — Telehealth: Payer: Self-pay | Admitting: *Deleted

## 2014-12-28 NOTE — Telephone Encounter (Signed)
Can we send the Brovana to the DME pharmacy for this pt to help get it covered. It can be sent to [facility] or [facility]. Please advise.  [name],  If Tammy agrees please print rx and place order. If you have any questions please let me know.

## 2014-12-29 ENCOUNTER — Encounter: Payer: Self-pay | Admitting: Pharmacist

## 2014-12-29 ENCOUNTER — Ambulatory Visit (HOSPITAL_COMMUNITY)
Admission: RE | Admit: 2014-12-29 | Discharge: 2014-12-29 | Disposition: A | Discharge status: Home / Self Care | Payer: Commercial Managed Care - HMO | Source: Ambulatory Visit | Attending: Family | Admitting: Family

## 2014-12-29 ENCOUNTER — Ambulatory Visit (INDEPENDENT_AMBULATORY_CARE_PROVIDER_SITE_OTHER)
Admission: RE | Admit: 2014-12-29 | Discharge: 2014-12-29 | Disposition: A | Discharge status: Home / Self Care | Payer: Commercial Managed Care - HMO | Source: Ambulatory Visit | Attending: Vascular Surgery | Admitting: Vascular Surgery

## 2014-12-29 ENCOUNTER — Encounter: Payer: Self-pay | Admitting: Family

## 2014-12-29 ENCOUNTER — Telehealth: Payer: Self-pay | Admitting: Internal Medicine

## 2014-12-29 ENCOUNTER — Other Ambulatory Visit: Payer: Self-pay | Admitting: *Deleted

## 2014-12-29 ENCOUNTER — Other Ambulatory Visit: Payer: Self-pay | Admitting: Vascular Surgery

## 2014-12-29 ENCOUNTER — Ambulatory Visit (INDEPENDENT_AMBULATORY_CARE_PROVIDER_SITE_OTHER): Payer: Commercial Managed Care - HMO | Admitting: Family

## 2014-12-29 VITALS — BP 171/78 | HR 51 | Temp 97.4°F | Resp 14 | Ht 72.0 in | Wt 172.0 lb

## 2014-12-29 DIAGNOSIS — Z48812 Encounter for surgical aftercare following surgery on the circulatory system: Secondary | ICD-10-CM | POA: Diagnosis not present

## 2014-12-29 DIAGNOSIS — I739 Peripheral vascular disease, unspecified: Secondary | ICD-10-CM

## 2014-12-29 DIAGNOSIS — E785 Hyperlipidemia, unspecified: Secondary | ICD-10-CM | POA: Diagnosis not present

## 2014-12-29 DIAGNOSIS — I70301 Unspecified atherosclerosis of unspecified type of bypass graft(s) of the extremities, right leg: Secondary | ICD-10-CM | POA: Diagnosis not present

## 2014-12-29 DIAGNOSIS — Z95828 Presence of other vascular implants and grafts: Secondary | ICD-10-CM | POA: Diagnosis not present

## 2014-12-29 DIAGNOSIS — Z87891 Personal history of nicotine dependence: Secondary | ICD-10-CM

## 2014-12-29 DIAGNOSIS — I10 Essential (primary) hypertension: Secondary | ICD-10-CM | POA: Insufficient documentation

## 2014-12-29 DIAGNOSIS — E119 Type 2 diabetes mellitus without complications: Secondary | ICD-10-CM | POA: Insufficient documentation

## 2014-12-29 NOTE — Patient Outreach (Signed)
[facility]) Care Management  12/29/2014  [name] [birth year] [id number]   CSW was able to make contact with patient's wife, [name] today to confirm that she received the packet of resource information that CSW mailed to her home on October 3rd.  Confirmation was received from [name] but no requests were made from [name] to assist patient with the placement process.  Nor was [name] interested in [place] making a referral for patient to any of the resources provided.  No additional social work needs have been identified at this time; therefore, CSW will proceed with case closure on patient. CSW will perform a case closure on patient, as all goals of treatment have been met from social work standpoint and no additional social work needs have been identified at this time. CSW will notify patient's RNCM with [name], [name] of CSW's plans to close patient's case. CSW will fax a correspondence letter to patient's Primary Care Physician, [name] to ensure that [name] is aware of CSW's involvement with patient.  [name], BSW, MSW, LCSW  Licensed Education officer, environmental Health System  Mailing [place] [address], [place], [place] [address] Physical Address-[address], [place], [place] [address] Toll Free Main # [phone] Fax # [phone] Cell # [phone]  Fax # [phone]  [name]

## 2014-12-29 NOTE — Telephone Encounter (Signed)
Order was placed with [facility] on 12/22/14 at ov with TP. Rachel with [facility] called and discussed with JJ pt's concerns about cost of meds.  Call to Arbie Cookey at [facility] was made to discuss med cost.  Message is in triage and is awaiting call back from [place] [employer] at [place]  Per open telephone message on 12/29/14.  Rinaldo Ratel, CMA at 12/29/2014 9:04 AM     Status: Signed       Expand All Collapse All   Pt last seen 9.28.16 by TP for med calendar: Patient Instructions     Flu shot today .  Finish Symbicort inhaler then  Begin Pulmicort and [employer] Twice daily  Stop Lisinopril .  Continue on Valsartan 160mg  daily .  Discuss with [name] regarding blood pressure meds .  Follow up in office in 6 months and As needed     [facility] and spoke pharmacist Apolonio Schneiders. Per Apolonio Schneiders she spoke with pt's spouse who is concerned about inhaler costs. At last ov, Symbicort was d/c'd and changed to Brovana and Budesonide. Per Apolonio Schneiders, spouse reports that pt is in the donut hole and is concerned with paying any copays at this point (currently paying approx $200 for the Symbicort). Spouse would like to discuss trying patient assistance for the Symbicort to see if this would help with cost - pt has [facility]. Advised Apolonio Schneiders will call [facility] to check on the cost of the nebs to see if this would be more cost effective for pt. Unice Cobble that if patient is indeed switched back to the Symbicort, the office can initiate the patient assistance. Apolonio Schneiders voiced her understanding and denied any further questions at this time.  Alverda Skeans and spoke with Jenny Reichmann who reported that Arbie Cookey Orthoptist with customer service) will not be in until 1pm - she will be the person to answer the question regarding cost. Number left with Pearla Dubonnet to ask for triage.

## 2014-12-29 NOTE — Progress Notes (Signed)
Filed Vitals:   12/29/14 1413 12/29/14 1424  BP: 164/79 171/78  Pulse: 54 51  Temp: 97.4 F (36.3 C)   TempSrc: Oral   Resp: 14   Height: 6' (1.829 m)   Weight: 172 lb (78.019 kg)   SpO2: 100%

## 2014-12-29 NOTE — Progress Notes (Signed)
[facility] [place] HISTORY AND PHYSICAL -PAD  History of Present Illness LEBARON BAUTCH is a 80 y.o. male patient of Dr. Kellie Simmering who is s/p right femoral-popliteal bypass grafting with Gore-Tex in February 2015 and his ulcerated toe successfully healed. He has a right to left femoral-femoral bypass and an occluded left femoral-popliteal above-knee Gore-Tex graft. He had a right common iliac artery stent placed on 04/20/13.  Pt last saw Dr. Kellie Simmering on 05/18/14. At that time Dr. Kellie Simmering did not think patient was having rest pain in the left foot to the degree that he would require attempt at a revascularization which has low chance of success. He would need below-knee femoral-popliteal bypass grafting with Gore-Tex and he has a failed graft on the left side. He currently has no nonhealing ulcers  or limb threatening ischemia. Ulcerations in the right foot have healed. Dr. Kellie Simmering discussed this at length with patient's wife and she was in agreement. Dr. Kellie Simmering stressed the importance of avoiding pressure sores or ulcerations. Dr. Evelena Leyden 05/18/14 assessment note indicates that If the patient does develop a pressure sore or nonhealing ulcer in the left foot he is a candidate for attempt at femoral-popliteal grafting on the left.  The patient returns today for 6 months duplex scan of his right femoral-popliteal graft and ABIs.  Wife states that pt's shortness of breath limits his walking, he does not seem to elicit claudication symptoms, but wife states she is walking with him daily to get him to walk, she states he uses his walker to sit when he gets tired.   Wife states pt has Alzheimer's Disease which has worsened recently.   Dr. Einar Gip is pt's cardiologist, wife states pt has CHF, denies any known history of MI. Wife states pt saw his PCP yesterday and Benicar was added to be started today, pt's blood pressure is elevated today.   Wife also denies any known history of pt having a  stroke or TIA.  The patient's wife denies New Medical or Surgical History for pt.  Pt Diabetic: "prediabetic" Pt smoker: former smoker, quit in the 1990's  Pt meds include: Statin :Yes Betablocker: Yes ASA: Yes Other anticoagulants/antiplatelets: no   Past Medical History  Diagnosis Date  . Hyperlipidemia   . PVD (peripheral vascular disease) ([place])   . Hypertension   . COPD (chronic obstructive pulmonary disease) ([place])   . Colon cancer ([place])   . Alzheimer's dementia 2013  . Pneumonia 2014  . Dyspnea   . Type II diabetes mellitus ([place])   . Depression   . CHF (congestive heart failure) ([place])   . Anemia     HX OF ANEMIA    Social History Social History  Substance Use Topics  . Smoking status: Former Smoker -- 0.30 packs/day for 40 years    Types: Cigarettes    Quit date: 03/26/1993  . Smokeless tobacco: Never Used  . Alcohol Use: No    Family History Family History  Problem Relation Age of Onset  . Stroke Mother 22  . Hypertension Mother   . Cancer Sister   . Heart disease Sister   . Heart disease Brother     Heart Disease before age 69  . Heart attack Brother   . Cancer Sister   . Cancer Brother     Past Surgical History  Procedure Laterality Date  . Colon surgery  1996    cancer  . Lumbar disc surgery  2005  . Vascular surgery    .  Colon resection  1997    /enc. notes 05/17/2004 (04/28/2013)  . Femoral artery - femoral artery bypass graft      right to left/enc. notes 05/17/2004 (04/28/2013)  . Femoral bypass Right     /enc. notes 05/17/2004 (04/28/2013)  . Fiberoptic bronchoscopy      /enc. notes 03/16/2005 (04/28/2013)  . Cataract extraction w/ intraocular lens implant Left 01/2013  . Cataract extraction Right 02/2013  . Femoral-popliteal bypass graft Right 04/29/2013    Procedure:  FEMORAL-POPLITEAL ARTERY Bypass Graft with intraoperative ultrasound and arteriogarm;  Surgeon: Mal Misty, MD;  Location: [facility];  Service: Vascular;  Laterality: Right;  .  Intraoperative arteriogram Right 04/29/2013    Procedure: INTRA OPERATIVE ARTERIOGRAM;  Surgeon: Mal Misty, MD;  Location: [facility];  Service: Vascular;  Laterality: Right;  . Spine surgery    . Esophagogastroduodenoscopy N/A 09/05/2013    Procedure: ESOPHAGOGASTRODUODENOSCOPY (EGD);  Surgeon: Lear Ng, MD;  Location: [facility] ENDOSCOPY;  Service: Endoscopy;  Laterality: N/A;  . Abdominal angiogram N/A 04/20/2013    Procedure: ABDOMINAL ANGIOGRAM;  Surgeon: Angelia Mould, MD;  Location: [facility] CATH LAB;  Service: Cardiovascular;  Laterality: N/A;  . Lower extremity angiogram  04/20/2013    Procedure: LOWER EXTREMITY ANGIOGRAM;  Surgeon: Angelia Mould, MD;  Location: [facility] CATH LAB;  Service: Cardiovascular;;  . Percutaneous stent intervention  04/20/2013    Procedure: PERCUTANEOUS STENT INTERVENTION;  Surgeon: Angelia Mould, MD;  Location: [facility] CATH LAB;  Service: Cardiovascular;;  right common iliac artery    No Known Allergies  Current Outpatient Prescriptions  Medication Sig Dispense Refill  . aspirin 81 MG tablet Take 81 mg by mouth every morning.     . Cholecalciferol (VITAMIN D) 2000 UNITS CAPS Take 2,000 Units by mouth every morning. Take one tablet twice daily    . digoxin (LANOXIN) 0.125 MG tablet Take 0.125 mg by mouth every morning.     . furosemide (LASIX) 20 MG tablet Take 1 tablet (20 mg total) by mouth daily. (Patient taking differently: Take 20 mg by mouth every morning. ) 5 tablet 0  . lisinopril (PRINIVIL,ZESTRIL) 20 MG tablet Take 20 mg by mouth daily.    . metoprolol tartrate (LOPRESSOR) 25 MG tablet Take 0.5 tablets (12.5 mg total) by mouth 2 (two) times daily. 30 tablet 0  . Multiple Vitamins-Minerals (CENTRUM SILVER PO) Take by mouth daily.    . pravastatin (PRAVACHOL) 40 MG tablet Take 40 mg by mouth at bedtime.     . Aclidinium Bromide (TUDORZA PRESSAIR) 400 MCG/ACT AEPB Inhale 1 puff into the lungs 2 (two) times daily. One twice daily (Patient not  taking: Reported on 12/03/2014)    . albuterol (PROVENTIL HFA;VENTOLIN HFA) 108 (90 BASE) MCG/ACT inhaler Inhale 2 puffs into the lungs every 6 (six) hours as needed for shortness of breath. (Patient not taking: Reported on 12/29/2014) 1 Inhaler 5  . albuterol (PROVENTIL) (5 MG/ML) 0.5% nebulizer solution Take 2.5 mg by nebulization every 6 (six) hours as needed for shortness of breath.    Marland Kitchen arformoterol (BROVANA) 15 MCG/2ML NEBU Take 2 mLs (15 mcg total) by nebulization 2 (two) times daily. (Patient not taking: Reported on 12/29/2014) 120 mL 1  . budesonide (PULMICORT) 0.25 MG/2ML nebulizer solution Take 2 mLs (0.25 mg total) by nebulization 2 (two) times daily. (Patient not taking: Reported on 12/27/2014) 120 mL 2  . budesonide-formoterol (SYMBICORT) 160-4.5 MCG/ACT inhaler Inhale 2 puffs into the lungs 2 (two) times daily.    Marland Kitchen  dextromethorphan-guaiFENesin (MUCINEX DM) 30-600 MG 12hr tablet Take 1-2 tablets by mouth every 12 hours as needed for cough, thick mucus, and congestion    . valsartan (DIOVAN) 160 MG tablet Take 160 mg by mouth every morning.      No current facility-administered medications for this visit.    ROS: See HPI for pertinent positives and negatives.   Physical Examination  Filed Vitals:   12/29/14 1413 12/29/14 1424  BP: 164/79 171/78  Pulse: 54 51  Temp: 97.4 F (36.3 C)   TempSrc: Oral   Resp: 14   Height: 6' (1.829 m)   Weight: 172 lb (78.019 kg)   SpO2: 100%    Body mass index is 23.32 kg/(m^2).  General: A&O x 2, WDWN. Gait: normal Eyes: PERRLA. Pulmonary: CTAB, without wheezes , rales or rhonchi. Cardiac: regular rhythm, no detected murmur.         Carotid Bruits Right Left   Negative Negative  Aorta is not palpable. Radial pulses: are palpable                           VASCULAR EXAM: Extremities without ischemic change, without Gangrene; without open wounds. Fem to fem bypass graft is palpable.                                                                                                           LE Pulses Right Left       FEMORAL  faintly palpable  faintly palpable        POPLITEAL  not palpable   not palpable       POSTERIOR TIBIAL  not palpable   not palpable        DORSALIS PEDIS      ANTERIOR TIBIAL not palpable  not palpable    Abdomen: soft, NT, no palpable masses. Skin: no rashes, no ulcers. Musculoskeletal: no muscle wasting or atrophy.  Neurologic: A&O X 2; Appropriate Affect ; SENSATION: normal; MOTOR FUNCTION:  moving all extremities equally, motor strength 5/5 throughout. Speech is fluent/normal.  CN 2-12 intact.    Non-Invasive Vascular Imaging: DATE: 12/29/2014 ABI: RIGHT: 0.69 (0.58, 04/23/14), Waveforms: monophasic, AT not detected;  LEFT: 0.53 (0.35), Waveforms: monophasic, PT not detected. Right LE DUPLEX SCAN OF BYPASS: No internal vessel narrowing noted within the right bypass graft or anastomosis. Doppler velocities indicate 50-74% right CFA stenosis which is increased compared to 04/23/14.  The right external iliac artery waveforms suggest a possible hemodynamically significant proximal iliac level arterial disease.    ASSESSMENT: [name] is a 80 y.o. male who is s/p right femoral-popliteal bypass grafting with Gore-Tex in February 2015 and his ulcerated toe successfully healed. He has a right to left femoral-femoral bypass and an occluded left femoral-popliteal above-knee Gore-Tex graft.  He had a right common iliac artery stent placed on 04/20/13.  He has Alzheimer's Disease, is oriented to person and place, he follows commands appropriately. His wife reports his history. His dyspnea seems to limit his walking, he has  CHF and COPD. His wife has been increasing his walking by walking with him, he uses a walker to sit and rest as needed. He has no signs of ischemia in his legs or feet. Wife states pt has no history of falling. Today's right LE arterial duplex suggests no internal vessel  narrowing noted within the right bypass graft or anastomosis. Doppler velocities indicate 50-74% right CFA stenosis which is increased compared to 04/23/14.  The right external iliac artery waveforms suggest a possible hemodynamically significant proximal iliac level arterial disease. ABI;s have improved slightly bilaterally; suggest moderate arterial occlusive disease.   Face to face time with patient was 25 minutes. Over 50% of this time was spent on counseling and coordination of care.    PLAN:  Continue graduated walking program. Based on the patient's vascular studies and examination, pt will return to clinic in 6 months with duplex scan of his right femoral-popliteal graft and repeat ABIs and also scan of his right to left femoral-femoral bypass.  I discussed in depth with the patient the nature of atherosclerosis, and emphasized the importance of maximal medical management including strict control of blood pressure, blood glucose, and lipid levels, obtaining regular exercise, and continued cessation of smoking.  The patient is aware that without maximal medical management the underlying atherosclerotic disease process will progress, limiting the benefit of any interventions.  The patient was given information about PAD including signs, symptoms, treatment, what symptoms should prompt the patient to seek immediate medical care, and risk reduction measures to take.  Clemon Chambers, RN, MSN, FNP-C [facility] Phone: [phone]  Clinic MD: Scot Dock  12/29/2014 2:47 PM

## 2014-12-29 NOTE — Telephone Encounter (Signed)
Pt last seen 9.28.16 by TP for med calendar: Patient Instructions     Flu shot today .  Finish Symbicort inhaler then  Begin Pulmicort and [employer] Twice daily  Stop Lisinopril .  Continue on Valsartan 160mg  daily .  Discuss with [name] regarding blood pressure meds .  Follow up in office in 6 months and As needed     [facility] and spoke pharmacist Apolonio Schneiders.  Per Apolonio Schneiders she spoke with pt's spouse who is concerned about inhaler costs.  At last ov, Symbicort was d/c'd and changed to Brovana and Budesonide.  Per Apolonio Schneiders, spouse reports that pt is in the donut hole and is concerned with paying any copays at this point (currently paying approx $200 for the Symbicort).  Spouse would like to discuss trying patient assistance for the Symbicort to see if this would help with cost - pt has [facility].  Advised Apolonio Schneiders will call [employer] to check on the cost of the nebs to see if this would be more cost effective for pt.  Unice Cobble that if patient is indeed switched back to the Symbicort, the office can initiate the patient assistance.  Apolonio Schneiders voiced her understanding and denied any further questions at this time.  Alverda Skeans and spoke with Jenny Reichmann who reported that Arbie Cookey Orthoptist with customer service) will not be in until 1pm - she will be the person to answer the question regarding cost.  Number left with Pearla Dubonnet to ask for triage.

## 2014-12-29 NOTE — Patient Instructions (Signed)
Peripheral Vascular Disease Peripheral vascular disease (PVD) is a disease of the blood vessels that are not part of your heart and brain. A simple term for PVD is poor circulation. In most cases, PVD narrows the blood vessels that carry blood from your heart to the rest of your body. This can result in a decreased supply of blood to your arms, legs, and internal organs, like your stomach or kidneys. However, it most often affects a person's lower legs and feet. There are two types of PVD.  Organic PVD. This is the more common type. It is caused by damage to the structure of blood vessels.  Functional PVD. This is caused by conditions that make blood vessels contract and tighten (spasm). Without treatment, PVD tends to get worse over time. PVD can also lead to acute ischemic limb. This is when an arm or limb suddenly has trouble getting enough blood. This is a medical emergency. CAUSES Each type of PVD has many different causes. The most common cause of PVD is buildup of a fatty material (plaque) inside of your arteries (atherosclerosis). Small amounts of plaque can break off from the walls of the blood vessels and become lodged in a smaller artery. This blocks blood flow and can cause acute ischemic limb. Other common causes of PVD include:  Blood clots that form inside of blood vessels.  Injuries to blood vessels.  Diseases that cause inflammation of blood vessels or cause blood vessel spasms.  Health behaviors and health history that increase your risk of developing PVD. RISK FACTORS  You may have a greater risk of PVD if you:  Have a family history of PVD.  Have certain medical conditions, including:  High cholesterol.  Diabetes.  High blood pressure (hypertension).  Coronary heart disease.  Past problems with blood clots.  Past injury, such as burns or a broken bone. These may have damaged blood vessels in your limbs.  Buerger disease. This is caused by inflamed blood  vessels in your hands and feet.  Some forms of arthritis.  Rare birth defects that affect the arteries in your legs.  Use tobacco.  Do not get enough exercise.  Are obese.  Are age 50 or older. SIGNS AND SYMPTOMS  PVD may cause many different symptoms. Your symptoms depend on what part of your body is not getting enough blood. Some common signs and symptoms include:  Cramps in your lower legs. This may be a symptom of poor leg circulation (claudication).  Pain and weakness in your legs while you are physically active that goes away when you rest (intermittent claudication).  Leg pain when at rest.  Leg numbness, tingling, or weakness.  Coldness in a leg or foot, especially when compared with the other leg.  Skin or hair changes. These can include:  Hair loss.  Shiny skin.  Pale or bluish skin.  Thick toenails.  Inability to get or maintain an erection (erectile dysfunction). People with PVD are more prone to developing ulcers and sores on their toes, feet, or legs. These may take longer than normal to heal. DIAGNOSIS Your health care provider may diagnose PVD from your signs and symptoms. The health care provider will also do a physical exam. You may have tests to find out what is causing your PVD and determine its severity. Tests may include:  Blood pressure recordings from your arms and legs and measurements of the strength of your pulses (pulse volume recordings).  Imaging studies using sound waves to take pictures of   the blood flow through your blood vessels (Doppler ultrasound).  Injecting a dye into your blood vessels before having imaging studies using:  X-rays (angiogram or arteriogram).  Computer-generated X-rays (CT angiogram).  A powerful electromagnetic field and a computer (magnetic resonance angiogram or MRA). TREATMENT Treatment for PVD depends on the cause of your condition and the severity of your symptoms. It also depends on your age. Underlying  causes need to be treated and controlled. These include long-lasting (chronic) conditions, such as diabetes, high cholesterol, and high blood pressure. You may need to first try making lifestyle changes and taking medicines. Surgery may be needed if these do not work. Lifestyle changes may include:  Quitting smoking.  Exercising regularly.  Following a low-fat, low-cholesterol diet. Medicines may include:  Blood thinners to prevent blood clots.  Medicines to improve blood flow.  Medicines to improve your blood cholesterol levels. Surgical procedures may include:  A procedure that uses an inflated balloon to open a blocked artery and improve blood flow (angioplasty).  A procedure to put in a tube (stent) to keep a blocked artery open (stent implant).  Surgery to reroute blood flow around a blocked artery (peripheral bypass surgery).  Surgery to remove dead tissue from an infected wound on the affected limb.  Amputation. This is surgical removal of the affected limb. This may be necessary in cases of acute ischemic limb that are not improved through medical or surgical treatments. HOME CARE INSTRUCTIONS  Take medicines only as directed by your health care provider.  Do not use any tobacco products, including cigarettes, chewing tobacco, or electronic cigarettes. If you need help quitting, ask your health care provider.  Lose weight if you are overweight, and maintain a healthy weight as directed by your health care provider.  Eat a diet that is low in fat and cholesterol. If you need help, ask your health care provider.  Exercise regularly. Ask your health care provider to suggest some good activities for you.  Use compression stockings or other mechanical devices as directed by your health care provider.  Take good care of your feet.  Wear comfortable shoes that fit well.  Check your feet often for any cuts or sores. SEEK MEDICAL CARE IF:  You have cramps in your legs  while walking.  You have leg pain when you are at rest.  You have coldness in a leg or foot.  Your skin changes.  You have erectile dysfunction.  You have cuts or sores on your feet that are not healing. SEEK IMMEDIATE MEDICAL CARE IF:  Your arm or leg turns cold and blue.  Your arms or legs become red, warm, swollen, painful, or numb.  You have chest pain or trouble breathing.  You suddenly have weakness in your face, arm, or leg.  You become very confused or lose the ability to speak.  You suddenly have a very bad headache or lose your vision.   This information is not intended to replace advice given to you by your health care provider. Make sure you discuss any questions you have with your health care provider.   Document Released: 04/19/2004 Document Revised: 04/02/2014 Document Reviewed: 08/20/2013 Elsevier Interactive Patient Education 2016 Elsevier Inc.  

## 2014-12-30 NOTE — Telephone Encounter (Signed)
(  [phone]) Geronimo at [employer] number.  Called for Arbie Cookey, she is out of town until Monday.  Spoke with [place], was instructed that we needed to speak to a pharmacist at [phone] Called pharmacy, pharmacy was not yet open.  Wcb.

## 2014-12-31 NOTE — Patient Outreach (Signed)
[name]) Care Management  12/31/2014  [name] 08-31-1934 [id number]   Telephone outreach to patient wife to assist in completing an extra help application. We tried to submit the application online but received an error stating we needed to call social security and request an application. I will call the wife on Monday and we will call social security on 3-way.  [name] L. Qais Jowers, [place] Care Management Assistant

## 2014-12-31 NOTE — Telephone Encounter (Signed)
Called and spoke to Dominica at [facility] and the pt's Pulmicort will cost is $75 per 30 days and the Garlon Hatchet will cost at $104 per 30 days. Called and spoke to pt's wife, [name]. [name] stated she does not want to move forward with the neb meds but rather continue with Symbicort with pt assistance, due to finances. [name] stated she will bring pt assistance forms by next week to start the process for the symbicort.   Will forward to Dr. Melvyn Novas and Magda Paganini as Juluis Rainier.

## 2015-01-04 ENCOUNTER — Other Ambulatory Visit: Payer: Self-pay | Admitting: Pharmacist

## 2015-01-04 NOTE — Patient Outreach (Signed)
[facility]) Care Management  01/04/2015  JAKIAH GOREE [name] 11, 1934 [id number]   Alfonza Toft is a 80yo male referred to [facility] for medication assistance.  I received a phone call from [name] who asked if anyone from [facility] was scheduled to make a visit to her home on 01/03/15.  Mrs. Shorb reports that she and her husband are currently in [place] at their daughter's house and they would not be home if anyone does make a home visit.  I told Mrs. Cen that no one from [facility] was scheduled to make a home visit this week.    Per review of patient's chart, Mrs. Blackburn wishes to proceed with process to try to get Symbicort covered through patient assistance program.  Mrs. Lavallee states she plans to take patient assistance form to Dr. Gustavus Bryant office on 01/04/15 or 01/05/15 when she returns from [place].    I will plan a telephone follow up with Mrs. Bielefeld in one week.    [name], Pharm.D. Pharmacy Resident [place] [phone]

## 2015-01-04 NOTE — Patient Outreach (Signed)
[name]) Care Management  01/04/2015  [name] 12-21-1934 [id number]   Telephone outreach to patient wife so we could call social security administration to request an application for extra help. We called and they have scheduled an appointment to call the wife on 01/18/2015 to complete the application. The wife was a little unclear as to the patient assistance applications that needed to be completed, so I will reach out to [name], PharmD who is scheduled to give them a call so she can be aware and assist them. No further action is needed from me at this time.  [name], [place] Care Management Assistant

## 2015-01-06 ENCOUNTER — Other Ambulatory Visit: Payer: Self-pay | Admitting: Pharmacist

## 2015-01-06 NOTE — Patient Outreach (Signed)
[facility]) Care Management  01/06/2015  [name] 06/06/1934 [id number]   [name] is a 80yo male referred to [place] for medication assistance. I received a request from [place] to call [name] as she was a little unclear as to the patient assistance applications that needed to be completed.  I called [name] who reports things are good.  She said she plans to take the Symbicort application to Dr. Gustavus Bryant office today.  She said [name] has not been switched to valsartan yet, but she plans to talk to [name] about this today as well.  I informed [name] that the application for Extra Help was an application to receive help for the cost of all prescription medications, and she voiced understanding.  [name] had questions about placement, and I provided her with the number of the social worker who she previously spoke to about placement.  I will alert [place] social worker, Product manager.  I will plan to follow up with [name] about patient assistance in two weeks.  Patient has my phone number should she have any further questions regarding patient assistance forms.     [name], Pharm.D. Pharmacy Resident [place] [phone]

## 2015-01-10 ENCOUNTER — Other Ambulatory Visit: Payer: Self-pay | Admitting: Pharmacist

## 2015-01-10 NOTE — Patient Outreach (Signed)
[facility]) Care Management  01/10/2015  [name] 1934-12-21 [id number]   [name] is a 80yo male referred to [place] for medication assistance. I received a phone call from patient's wife/caregiver, [name], who reports she is overwhelmed and confused about the paperwork to set up patient assistance for [name]'s medications and requested help sorting through the paperwork.  I set up a home visit for tomorrow, 01/11/15 at 9:30 AM.     [name], Pharm.D. Pharmacy Resident [place] [phone]

## 2015-01-11 ENCOUNTER — Telehealth: Payer: Self-pay | Admitting: Internal Medicine

## 2015-01-11 ENCOUNTER — Other Ambulatory Visit: Payer: Self-pay | Admitting: Pharmacist

## 2015-01-11 NOTE — Telephone Encounter (Signed)
LMTCB- is this for Symbicort? If so, he is supposed to be on nebs now and not symbicort

## 2015-01-11 NOTE — Patient Outreach (Signed)
[facility]) Care Management  [place]   01/11/2015  [name] 1935-01-07 [id number]  Subjective: [name] is a 80yo male who I am following for medication assistance.  I made home visit today to meet with patient's wife/health care power of attorney to answer questions regarding patient assistance applications.  [name] had a stack of paperwork for me to review.  She had a letter from [employer] Prescription saving program dated on September 1st, 2016 stating [name] had been temporarily enrolled in the program and provided with his AstraZen[employer] medication.  [name] states they were never provided with AstraZen[employer] medication.  I called [employer] at [phone] to get update information on application.  I spoke to [name] who was unable to provide me with additional information since neither I nor patient's wife was on the application.  [name] states she will need [name]'s proof of healthcare power of attorney to provide additional information.  Letter did state that to fully enroll in program, patient needed to fill out application and send in proof of income.  I assisted [name] in filling out application and collect all income information for her to take to Dr. Gustavus [name] office.  Patient has a sample symbicort inhaler available for use at this time.    I reviewed all medications while I was at the patient's home.  She uses a pill box to keep medications organized.  [name] reports patient was put on Benicar HCT by Dr. Lynder [name] office in place of lisinopril.  Dr. Lynder [name] office is providing her with samples while she is in the donut hole.  Patient was instructed to stop lisinopril, and [name] reports dry cough has improved since stopping lisinopril.    Objective:   Current Medications: Current Outpatient Prescriptions  Medication Sig Dispense Refill  . albuterol (PROVENTIL HFA;VENTOLIN HFA) 108 (90 BASE) MCG/ACT inhaler Inhale 2 puffs into the lungs  every 6 (six) hours as needed for shortness of breath. 1 Inhaler 5  . albuterol (PROVENTIL) (5 MG/ML) 0.5% nebulizer solution Take 2.5 mg by nebulization every 6 (six) hours as needed for shortness of breath.    Marland Kitchen aspirin 81 MG tablet Take 81 mg by mouth every morning.     . budesonide-formoterol (SYMBICORT) 160-4.5 MCG/ACT inhaler Inhale 2 puffs into the lungs 2 (two) times daily.    . Cholecalciferol (VITAMIN D) 2000 UNITS CAPS Take 2,000 Units by mouth every morning. Take one tablet twice daily    . digoxin (LANOXIN) 0.125 MG tablet Take 0.125 mg by mouth every morning.     . furosemide (LASIX) 20 MG tablet Take 1 tablet (20 mg total) by mouth daily. (Patient taking differently: Take 20 mg by mouth every morning. ) 5 tablet 0  . metoprolol tartrate (LOPRESSOR) 25 MG tablet Take 0.5 tablets (12.5 mg total) by mouth 2 (two) times daily. 30 tablet 0  . Multiple Vitamins-Minerals (CENTRUM SILVER PO) Take by mouth daily.    Marland Kitchen olmesartan-hydrochlorothiazide (BENICAR HCT) 20-12.5 MG tablet Take 1 tablet by mouth daily.    . pravastatin (PRAVACHOL) 40 MG tablet Take 40 mg by mouth at bedtime.     . traZODone (DESYREL) 50 MG tablet Take 50 mg by mouth at bedtime as needed for sleep.    . Aclidinium Bromide (TUDORZA PRESSAIR) 400 MCG/ACT AEPB Inhale 1 puff into the lungs 2 (two) times daily. One twice daily (Patient not taking: Reported on 12/03/2014)    . arformoterol (BROVANA) 15 MCG/2ML NEBU Take 2 mLs (  15 mcg total) by nebulization 2 (two) times daily. (Patient not taking: Reported on 12/29/2014) 120 mL 1  . budesonide (PULMICORT) 0.25 MG/2ML nebulizer solution Take 2 mLs (0.25 mg total) by nebulization 2 (two) times daily. (Patient not taking: Reported on 12/27/2014) 120 mL 2  . dextromethorphan-guaiFENesin (MUCINEX DM) 30-600 MG 12hr tablet Take 1-2 tablets by mouth every 12 hours as needed for cough, thick mucus, and congestion    . lisinopril (PRINIVIL,ZESTRIL) 20 MG tablet Take 20 mg by mouth daily.     . valsartan (DIOVAN) 160 MG tablet Take 160 mg by mouth every morning.      No current facility-administered medications for this visit.   Functional Status: In your present state of health, do you have any difficulty performing the following activities: 12/27/2014 12/22/2014  Hearing? Tempie Donning  Vision? N N  Difficulty concentrating or making decisions? Tempie Donning  Walking or climbing stairs? Y N  Dressing or bathing? Y Y  Doing errands, shopping? Tempie Donning  Preparing Food and eating ? Y -  Using the Toilet? Y -  In the past six months, have you accidently leaked urine? Y -  Do you have problems with loss of bowel control? Y -  Managing your Medications? Y -  Managing your Finances? Y -  Housekeeping or managing your Housekeeping? Y -   Fall/Depression Screening: PHQ 2/9 Scores 12/27/2014 12/22/2014  PHQ - 2 Score 1 1  PHQ- 9 Score 4 3    Assessment: 1.  Medication assistance:  Helped [name] complete Symbicort patient assistance form and collect documents to submit with the form such as proof of income, health care power of attorney, and medicare card.  Patient will take all documents to Dr. Gustavus [name] office for Dr. Melvyn [name] to complete the prescriber portion of of the application.    Plan: 1.  Patient will take completed form to Dr. Gustavus [name] office with proof of income, [employer] card, and healthcare power of attorney form.  Patient will mail or request for doctor's office to fax in completed form.   2.  Patient will call me once she hears from patient assistance program.  I will plan to call patient in two weeks if I have not heard from her.     [facility] CM Care Plan Problem One        Most Recent Value   Care Plan Problem One  Patient unable to afford Symbicort   Role Documenting the Problem One  Clinical Pharmacist   Care Plan for Problem One  Active   [facility] Long Term Goal (31-90 days)  Patient will submit application for Symbicort patient assistance in the next 30 days per patient report.     [facility] Long Term Goal Start Date  12/27/14   Interventions for Problem One Long Term Goal  I made visit to answer questions mary had about the Symbicort application.  I assisted [name] in filling out her portion of the application and collect all documents that needed to be submitted with the application. [name] will take the application and documents to Dr. Gustavus [name] office to have the prescriber section completed.      [name], Pharm.D. Pharmacy Resident [place] [phone]

## 2015-01-12 ENCOUNTER — Encounter: Payer: Self-pay | Admitting: Pharmacist

## 2015-01-12 MED ORDER — BUDESONIDE-FORMOTEROL FUMARATE 160-4.5 MCG/ACT IN AERO: 2.0000 | INHALATION_SPRAY | Freq: Two times a day (BID) | RESPIRATORY_TRACT | Status: DC

## 2015-01-12 NOTE — Telephone Encounter (Signed)
Spoke with the pt's spouse  She states that pt is needing pt assistance for symbicort  They are unable to afford nebs  Will have MW sign symbciort today  Will forward to my basket to f/u on

## 2015-01-12 NOTE — Telephone Encounter (Signed)
LMTCB

## 2015-01-12 NOTE — Telephone Encounter (Signed)
Forms completed and faxed to [facility] [employer] along with symbicort rx  Pt's spouse informed and nothing further needed

## 2015-01-13 ENCOUNTER — Encounter: Payer: Self-pay | Admitting: *Deleted

## 2015-01-13 ENCOUNTER — Other Ambulatory Visit: Payer: Self-pay | Admitting: *Deleted

## 2015-01-13 VITALS — BP 124/72 | HR 64 | Resp 16

## 2015-01-13 DIAGNOSIS — G309 Alzheimer's disease, unspecified: Principal | ICD-10-CM

## 2015-01-13 DIAGNOSIS — F0280 Dementia in other diseases classified elsewhere without behavioral disturbance: Secondary | ICD-10-CM

## 2015-01-13 NOTE — Patient Outreach (Signed)
[name]) Care Management   01/13/2015  [name] 1934-10-10 [id number]  [name] is an 80 y.o. male  Subjective: Patient verbalized "I feel good". He was able to name and identify his wife today which he does not usually do, per wife's report. Wife states "his dementia had gotten worse", "decline in his condition seemed to have changed overnight, and never expected it will progress that quick".  Objective: BP 124/72 mmHg  Pulse 64  Resp 16  SpO2 97%   Review of Systems  Constitutional: Negative.   HENT: Negative.   Eyes: Negative.   Respiratory: Positive for cough and sputum production. Negative for wheezing.        Occasional coughing- wet cough with minimal whitish sputum Mild crackle to right upper lobe, otherwise clear  Breathing even and unlabored at rest Shortness of breath with walking per wife's report.  Cardiovascular: Negative.        Leg swelling has improved Regular rate and rhythm  Gastrointestinal: Positive for diarrhea.       Incontinent- wears depends  Genitourinary:       Incontinent- wears depends  Musculoskeletal: Negative.  Negative for falls.  Skin: Negative.   Neurological: Negative.   Endo/Heme/Allergies: Negative.   Psychiatric/Behavioral: Positive for memory loss.       Alzheimer's dementia    Physical Exam  Current Medications:   Current Outpatient Prescriptions  Medication Sig Dispense Refill  . albuterol (PROVENTIL HFA;VENTOLIN HFA) 108 (90 BASE) MCG/ACT inhaler Inhale 2 puffs into the lungs every 6 (six) hours as needed for shortness of breath. 1 Inhaler 5  . albuterol (PROVENTIL) (5 MG/ML) 0.5% nebulizer solution Take 2.5 mg by nebulization every 6 (six) hours as needed for shortness of breath.    Marland Kitchen aspirin 81 MG tablet Take 81 mg by mouth every morning.     . budesonide-formoterol (SYMBICORT) 160-4.5 MCG/ACT inhaler Inhale 2 puffs into the lungs 2 (two) times daily. 3 Inhaler 3  . Cholecalciferol (VITAMIN  D) 2000 UNITS CAPS Take 2,000 Units by mouth every morning. Take one tablet twice daily    . digoxin (LANOXIN) 0.125 MG tablet Take 0.125 mg by mouth every morning.     . furosemide (LASIX) 20 MG tablet Take 1 tablet (20 mg total) by mouth daily. (Patient taking differently: Take 20 mg by mouth every morning. ) 5 tablet 0  . metoprolol tartrate (LOPRESSOR) 25 MG tablet Take 0.5 tablets (12.5 mg total) by mouth 2 (two) times daily. 30 tablet 0  . Multiple Vitamins-Minerals (CENTRUM SILVER PO) Take by mouth daily.    Marland Kitchen olmesartan-hydrochlorothiazide (BENICAR HCT) 20-12.5 MG tablet Take 1 tablet by mouth daily.    . pravastatin (PRAVACHOL) 40 MG tablet Take 40 mg by mouth at bedtime.     . traZODone (DESYREL) 50 MG tablet Take 50 mg by mouth at bedtime as needed for sleep.    . Aclidinium Bromide (TUDORZA PRESSAIR) 400 MCG/ACT AEPB Inhale 1 puff into the lungs 2 (two) times daily. One twice daily (Patient not taking: Reported on 01/13/2015)    . arformoterol (BROVANA) 15 MCG/2ML NEBU Take 2 mLs (15 mcg total) by nebulization 2 (two) times daily. (Patient not taking: Reported on 12/29/2014) 120 mL 1  . budesonide (PULMICORT) 0.25 MG/2ML nebulizer solution Take 2 mLs (0.25 mg total) by nebulization 2 (two) times daily. (Patient not taking: Reported on 12/27/2014) 120 mL 2  . dextromethorphan-guaiFENesin (MUCINEX DM) 30-600 MG 12hr tablet Take 1-2 tablets by mouth  every 12 hours as needed for cough, thick mucus, and congestion    . lisinopril (PRINIVIL,ZESTRIL) 20 MG tablet Take 20 mg by mouth daily.    . valsartan (DIOVAN) 160 MG tablet Take 160 mg by mouth every morning.      No current facility-administered medications for this visit.    Functional Status:   In your present state of health, do you have any difficulty performing the following activities: 12/27/2014 12/22/2014  Hearing? Tempie Donning  Vision? N N  Difficulty concentrating or making decisions? Tempie Donning  Walking or climbing stairs? Y N  Dressing or  bathing? Y Y  Doing errands, shopping? Tempie Donning  Preparing Food and eating ? Y -  Using the Toilet? Y -  In the past six months, have you accidently leaked urine? Y -  Do you have problems with loss of bowel control? Y -  Managing your Medications? Y -  Managing your Finances? Y -  Housekeeping or managing your Housekeeping? Y -    Fall/Depression Screening:    PHQ 2/9 Scores 12/27/2014 12/22/2014  PHQ - 2 Score 1 1  PHQ- 9 Score 4 3    Assessment:  Arrived at patient's home. Patient's wife was present and patient came in later from adult enrichment program during the visit. Patient told his wife that he did some driving but not far and did some work today where he came from.  Wife reports that patient usually gets up very early in the morning to shower, get dressed and thinks he has to go to work in [place], [place]. Patient also has tendency of tearing his depends and scatters fecal matter and urine that wife has to clean up and leaves her unable to get enough rest or sleep, as reported. Wife states, "if he is awake, I have to be awake too, or else he will make a big mess". She verbalized "living under pressure".  Ms. Schreier, reiterated the need for social worker assistance. Patient's wife was emotional and teary-eyed, stating that she feels incapable of giving patient the care that he needs now. She expressed her desire for appropriate placement for her husband. [facility] social work referral has been made once again and made wife aware of it.  Patient is able to ambulate around the house with out a walker. He gets up and walks to the bathroom when needed.  He asks questions repeatedly. He is calm and has a bright affect. He usually follows wife's instructions. He doses on and off during the visit.  Patient had a follow up appointment with his primary care provider ([name]) on 10/5 and changed his Valsartan to Benicar. His primary care provider provides Benicar samples for patient to help with  patient's medication cost. Patient remains on Symbicort for his preventive inhaler. [facility] pharmacist Emi Belfast) assisted patient's wife to apply for Symbicort assistance through Dr. Gustavus Bryant (pulmonologist) office and medication will be mailed to their home. Patient is on Albuterol nebulizer for rescue medication and wife reports patient is probably only using it 3- 4 times a year.   Patient also had his follow-up with vascular surgeon (Dr. Kellie Simmering) on 10/5. Patient had his flu shot. Patient's ulcerated toe on his right foot had already been healed and no ischemia or ulcers to feet or legs noted. Wife was encouraged to check patient's blood pressure regularly and record on [facility] calendar provided.  Encouraged wife to continue with patient's walking exercise and use of walker with seat to rest when  he gets tired as he tends to get short of breath when walks at a distance as per wife.  According to wife, patient's breathing had been okay with no noted increased coughing after Lisinopril was discontinued. She continues to manage patient's medications and administers them as directed.  COPD zone tool, COPD action plan and signs and symptoms of COPD flare-up were reviewed with wife this visit since she tends to get easily confused and overwhelmed.  Patient's diabetes has been well controlled with diet and he is not taking any medications for it per wife's report.  Wife denies any other needs or concerns for patient at this point.  Encouraged to call [facility], care management coordinator and 24-hour nurse line as needed. Contact informations are with wife. Patient and wife agreed to a home visit next month.    Plan: Routine home visit on 11/21 Referral to [place] worker for assistance with placement per wife's request   [facility] CM Care Plan Problem One        Most Recent Value   Care Plan Problem One  knowledge deficit regarding COPD   Role Documenting the Problem One  Care Management Coordinator   Care Plan  for Problem One  Active   [facility] Long Term Goal (31-90 days)  Patient's wife will be able to verbalize at least 3 strategies in managing COPD in the next 31 days   [facility] Long Term Goal Start Date  12/22/14   Interventions for Problem One Long Term Goal  review [facility] COPD packet provided,  discuss COPD zone tool,  discuss COPD action plan,  encourage medication adherence,  discuss importance in attending follow-up doctor's' appointments,  discuss avoidance of environmental irritants,  discuss regarding breathing treatments like inhalers, nebulization and rescue inhalers,  confirmed review pulmonologist instruction regarding medication adjustments and changes    [facility] CM Short Term Goal #1 (0-30 days)  patient's wife will be able to identify the 3 different COPD zones in the next 30 days   [facility] CM Short Term Goal #1 Start Date  12/22/14   Interventions for Short Term Goal #1  review [facility] COPD packet of information,  explained the 3 different COPD zones,  explained the signs and symptoms for each zone,  encourage to evaluate patient's daily status using the COPD zone tool, [facility] COPD magnet placed on refrigerator door for easier access and review    [facility] CM Short Term Goal #2 (0-30 days)  patient's wife will verbalize COPD action plan in the next 30 days   [facility] CM Short Term Goal #2 Start Date  12/22/14   Interventions for Short Term Goal #2  rediscuss [facility] COPD packet of information,  explained COPD action plan,  provided COPD action plan magnet on patient's refrigerator to refer to,  explain COPD signs and symptoms and when to call the doctor for help    [facility] CM Short Term Goal #3 (0-30 days)  patient's wfe will identify at least 3 signs and symptoms of COPD flare-up and when to call the doctor in the next 30 days   [facility] CM Short Term Goal #3 Start Date  12/22/14   Interventions for Short Tern Goal #3  explained the signs and symptoms of COPD exacerbation and when to call the doctor for help,  explained use of COPD action  plan if patient starts to manifest signd and symptoms of COPD  flare-up,  encourage to evaluate patient's daily condition using COPD zone tool,  encourage wife to read  [facility] COPD packet of information and ask questions if any       Dezaree Tracey A. Madelynne Lasker, BSN, RN-BC [place] Management Coordinator Cell: [phone]

## 2015-01-14 ENCOUNTER — Encounter: Payer: Self-pay | Admitting: *Deleted

## 2015-01-14 NOTE — Patient Outreach (Signed)
[facility]) Care Management  01/14/2015  [name] Dec 19, 1934 [id number]   Request from [name], RN to assign SW, assigned [employer], LCSW.  Thanks, Ronnell Freshwater. [place], [place] Assistant Phone: [phone] Fax: [phone]

## 2015-01-17 ENCOUNTER — Other Ambulatory Visit: Payer: Commercial Managed Care - HMO | Admitting: *Deleted

## 2015-01-18 NOTE — Patient Outreach (Signed)
[place] [facility]) Care Management  01/18/2015  [name] 1934-09-05 [id number]   CSW received a new referral on patient from patient's RNCM with [place] Management, [name], reporting that patient and patient's wife, [name] would benefit from social work services and resources to assist with long-term care placement arrangements for patient.  [name] indicated that Mrs. Martine repeatedly expresses interest in finding appropriate placement arrangements for patient.  Patient has been diagnosed with Alzheimer's/Dementia, requiring placement into an assisted living facility that offers memory care and the wander-guard system.  Mrs. Faciane reported to [name] that she is no longer capable of providing the care that patient needs in the home, as it is affecting her health and well-being.   CSW made an initial attempt to try and contact patient and patient's wife, [name] today to perform phone assessment, as well as assess and assist with social work needs and services, without success.  CSW left a HIPAA compliant message on voicemail and is currently awaiting a return call.  In the meantime, CSW will mail patient and Mrs. Sellitto a packet of resource information to review independently.  This packet of information will include a complete listing of all long-term care facilities with memory care units within a 50-mile radius, eligibility for Long-Term Care Medicaid coverage, etc.  [name], BSW, MSW, LCSW  Licensed Clinical Social Worker  [place]  Mailing [place] [address], [place], [place] [address] Physical Address-[address], [place], [place] [address] Toll Free Main # [phone] Fax # [phone] Cell # [phone]  Fax # [phone]  [name]

## 2015-01-20 ENCOUNTER — Other Ambulatory Visit: Payer: Self-pay | Admitting: *Deleted

## 2015-01-21 ENCOUNTER — Encounter: Payer: Self-pay | Admitting: *Deleted

## 2015-01-21 NOTE — Patient Outreach (Signed)
[facility]) Care Management  [facility] Social Work  01/21/2015  PRIEST LOCKRIDGE 13-Feb-1935 [id number]  Subjective:    "I've been falling a lot lately, have an appointment to meet with my doctor to have tests done.  I can no longer care for Shooter, he's becoming too much for me to manage on my own.  I need help placing him."  Objective:   CSW agreed to assist patient and patient's wife, Tajai Ihde with pursuing placement arrangements for patient in a long-term care facility that has a memory care unit and wander-guard system.  Current Medications:  Current Outpatient Prescriptions  Medication Sig Dispense Refill  . Aclidinium Bromide (TUDORZA PRESSAIR) 400 MCG/ACT AEPB Inhale 1 puff into the lungs 2 (two) times daily. One twice daily (Patient not taking: Reported on 01/13/2015)    . albuterol (PROVENTIL HFA;VENTOLIN HFA) 108 (90 BASE) MCG/ACT inhaler Inhale 2 puffs into the lungs every 6 (six) hours as needed for shortness of breath. 1 Inhaler 5  . albuterol (PROVENTIL) (5 MG/ML) 0.5% nebulizer solution Take 2.5 mg by nebulization every 6 (six) hours as needed for shortness of breath.    Marland Kitchen arformoterol (BROVANA) 15 MCG/2ML NEBU Take 2 mLs (15 mcg total) by nebulization 2 (two) times daily. (Patient not taking: Reported on 12/29/2014) 120 mL 1  . aspirin 81 MG tablet Take 81 mg by mouth every morning.     . budesonide (PULMICORT) 0.25 MG/2ML nebulizer solution Take 2 mLs (0.25 mg total) by nebulization 2 (two) times daily. (Patient not taking: Reported on 12/27/2014) 120 mL 2  . budesonide-formoterol (SYMBICORT) 160-4.5 MCG/ACT inhaler Inhale 2 puffs into the lungs 2 (two) times daily. 3 Inhaler 3  . Cholecalciferol (VITAMIN D) 2000 UNITS CAPS Take 2,000 Units by mouth every morning. Take one tablet twice daily    . dextromethorphan-guaiFENesin (MUCINEX DM) 30-600 MG 12hr tablet Take 1-2 tablets by mouth every 12 hours as needed for cough, thick mucus, and congestion    .  digoxin (LANOXIN) 0.125 MG tablet Take 0.125 mg by mouth every morning.     . furosemide (LASIX) 20 MG tablet Take 1 tablet (20 mg total) by mouth daily. (Patient taking differently: Take 20 mg by mouth every morning. ) 5 tablet 0  . lisinopril (PRINIVIL,ZESTRIL) 20 MG tablet Take 20 mg by mouth daily.    . metoprolol tartrate (LOPRESSOR) 25 MG tablet Take 0.5 tablets (12.5 mg total) by mouth 2 (two) times daily. 30 tablet 0  . Multiple Vitamins-Minerals (CENTRUM SILVER PO) Take by mouth daily.    Marland Kitchen olmesartan-hydrochlorothiazide (BENICAR HCT) 20-12.5 MG tablet Take 1 tablet by mouth daily.    . pravastatin (PRAVACHOL) 40 MG tablet Take 40 mg by mouth at bedtime.     . traZODone (DESYREL) 50 MG tablet Take 50 mg by mouth at bedtime as needed for sleep.    . valsartan (DIOVAN) 160 MG tablet Take 160 mg by mouth every morning.      No current facility-administered medications for this visit.    Functional Status:  In your present state of health, do you have any difficulty performing the following activities: 01/21/2015 12/27/2014  Hearing? Tempie Donning  Vision? Y N  Difficulty concentrating or making decisions? Tempie Donning  Walking or climbing stairs? Y Y  Dressing or bathing? Y Y  Doing errands, shopping? Tempie Donning  Preparing Food and eating ? Y Y  Using the Toilet? Y Y  In the past six months, have you accidently leaked  urine? Y Y  Do you have problems with loss of bowel control? Y Y  Managing your Medications? Y Y  Managing your Finances? Tempie Donning  Housekeeping or managing your Housekeeping? Tempie Donning    Fall/Depression Screening:  PHQ 2/9 Scores 01/21/2015 12/27/2014 12/22/2014  PHQ - 2 Score 0 1 1  PHQ- 9 Score - 4 3    Assessment:   CSW was able to make initial contact with patient today to perform phone assessment, as well as assess and assist with social work needs and services.  CSW introduced self, explained role and types of services provided through [facility] ([place]).  CSW further explained to patient that CSW works with patient's RNCM, also with [facility], [name] . CSW then explained the reason for the call, indicating that [name] felt that patient and patient's wife, Kylar Leonhardt would benefit from social work services to assist with long-term placement arrangements.  Mrs. Vanallen admitted to [name] that she is no longer able to care for patient in the home due to her own health concerns.  CSW was able to converse with Mrs. Maker to complete the initial assessment on patient, as patient lacks the capacity to answer questions appropriately due to his diagnosis of Alzheimer's/Dementia.  CSW obtained two HIPAA compliant identifiers from Mrs. Lamarque, which included patient's name and date of birth.  Mrs. Hartt reported that she has been falling a lot lately, needing to schedule an appointment with her primary care physician to have him run some tests.  Mrs. Chichester indicated that she would like to be able to focus on her own health issues, without having the "burden of caring for Kass". CSW agreed to contact patient's Primary Care Physician, [name] to request completion of an FL-2 Form on patient, required for placement purposes.  CSW also agreed to mail Mrs. Lua a complete listing of all skilled nursing facilities, within a 50-mile radius, that offer long-term care services, have a memory care unit and are equipped with a wander-guard system.  Mrs. Crescenzo has been charged with the task of reviewing the list and touring facilities of interest.  Mrs. Chio indicated that she would like to find a facility that is within 10 miles, or less, of her home.  CSW is aware that [facility] is less than 10 miles from patient's current place of residence.  Mrs. Hanners reported that she would "check it out", but encouraged CSW to fax patient's information to this facility. CSW was able to make contact with  Lannie Fields, Administrative Department at [facility], to discuss bed availability, finances and whether or not they are equipped to manage patient's level of care.  Mrs. Megan Salon reported that she would review patient's FL-2 Form, once submitted to her by CSW, and get back with CSW at her earliest convenience to discuss bed offers.  CSW then agreed to follow-up with Mrs. Wiltrout to report findings.  Mrs. Tavella indicated that she would like to have patient placed outside the home, as soon as possible.  No additional social work needs have been identified at this time.  Plan:   CSW will contact patient's Primary Care Physician, [name] to request completion of an FL-2 form for placement purposes for patient. CSW will fax a barriers letter and correspondence letter to patient's Primary Care Physician, [name]. CSW will prescribe and print EMMI information to review with patient and  Mrs. Harewood during the initial home visit. CSW will converse with patient's RNCM with [facility], [name] to report findings of phone conversation with patient today. CSW will mail a complete listing of all skilled nursing facilities, within a 50-mile radius, that offer long-term care services, have a memory care unit and are equipped with a wander-guard system.  [name], BSW, MSW, LCSW  Licensed Education officer, environmental Health System  Mailing [place] [address] [place], [place] [address] Physical Address-[address], [place], [place] [address] Toll Free Main # [phone] Fax # [phone] Cell # [phone]  Fax # [phone]  [name]

## 2015-01-26 ENCOUNTER — Other Ambulatory Visit: Payer: Self-pay | Admitting: *Deleted

## 2015-01-27 NOTE — Patient Outreach (Signed)
[facility]) Care Management  01/27/2015  KHALIB FENDLEY 1935/01/16 [id number]   CSW was able to make contact with patient's wife, Isidore Margraf today to follow-up regarding placement arrangements for patient.  CSW explained to Mrs. Maniscalco that CSW is still awaiting a completed and signed FL-2 Form from [name], patient's Primary Care Physician.  CSW further explained to Mrs. Stefanski that CSW left a HIPAA compliant message for [name] on voicemail today, requesting that [name] complete an FL-2 Form on patient's behalf so that [facility] can initiate long-term care placement for patient.  Mrs. Hunnell also agreed to contact [name] office to make the request.  In the meantime, CSW encouraged Mrs. Callens to go and view skilled nursing facilities of interest, such as [facility].  Mrs. Sanders voiced understanding and was agreeable to this plan.  CSW will follow-up with Mrs. Kersten as soon as any progress is made.  CSW will fax a correspondence letter to [name].  [name], BSW, MSW, LCSW  Licensed Education officer, environmental Health System  Mailing [place] [address] [place], [place] [address] Physical Address-[address], [place], [place] [address] Toll Free Main # [phone] Fax # [phone] Cell # [phone]  Fax # [phone]  [name]

## 2015-01-31 ENCOUNTER — Other Ambulatory Visit: Payer: Self-pay | Admitting: *Deleted

## 2015-01-31 NOTE — Patient Outreach (Signed)
[facility]) Care Management  01/31/2015  [name] [birth year] [id number]   Per Estill Bamberg, Nurse with [facility], [name], patient's Primary Care Physician, is requesting a blank FL-2 Form to complete, per CSW's request, on patient for long-term care placement purposes.  FL-2 Form has been faxed via Care Management Assistant, [place] with [facility]. CSW is awaiting a return call regarding completion, as CSW will make arrangements to pick up the completed and signed FL-2 Form from the [facility] and hand-deliver to [facility], [place] of interest, for review and possible long-term care placement arrangements.  [name], BSW, MSW, LCSW  Licensed Education officer, environmental Health System  Mailing [place] [address], [place], [place] [address] Physical Address-[address], [place], [place] [address] Toll Free Main # [phone] Fax # [phone] Cell # [phone]  Fax # [phone]  [name]

## 2015-02-01 ENCOUNTER — Other Ambulatory Visit: Payer: Self-pay | Admitting: Pharmacist

## 2015-02-01 NOTE — Patient Outreach (Signed)
[facility]) Care Management  [place]   02/01/2015  DANYON MCGINNESS [birth year] [id number]  Subjective: [name] is a 80yo male who I am following for medication assistance. I received return phone call from patient's wife Earnie Bechard who reports the patient received a three month supply of Symbicort inhaler in the mail and was approved for Symbicort patient assistance for one year.  Patient currently has all medications available at this time.  Mrs. Lile reports the patient has two months of samples of Benicar HCT from [name] office which will almost last him until the end of the year.  She reports patient has an upcoming appointment with [name] and she plans to pick up more samples at that time to last him until he is out of the donut hole.  Mrs. Yielding denies any medication related concerns at this time.    Objective:   Current Medications: Current Outpatient Prescriptions  Medication Sig Dispense Refill  . Aclidinium Bromide (TUDORZA PRESSAIR) 400 MCG/ACT AEPB Inhale 1 puff into the lungs 2 (two) times daily. One twice daily (Patient not taking: Reported on 01/13/2015)    . albuterol (PROVENTIL HFA;VENTOLIN HFA) 108 (90 BASE) MCG/ACT inhaler Inhale 2 puffs into the lungs every 6 (six) hours as needed for shortness of breath. 1 Inhaler 5  . albuterol (PROVENTIL) (5 MG/ML) 0.5% nebulizer solution Take 2.5 mg by nebulization every 6 (six) hours as needed for shortness of breath.    Marland Kitchen arformoterol (BROVANA) 15 MCG/2ML NEBU Take 2 mLs (15 mcg total) by nebulization 2 (two) times daily. (Patient not taking: Reported on 12/29/2014) 120 mL 1  . aspirin 81 MG tablet Take 81 mg by mouth every morning.     . budesonide (PULMICORT) 0.25 MG/2ML nebulizer solution Take 2 mLs (0.25 mg total) by nebulization 2 (two) times daily. (Patient not taking: Reported on 12/27/2014) 120 mL 2  . budesonide-formoterol (SYMBICORT) 160-4.5 MCG/ACT inhaler Inhale 2 puffs into  the lungs 2 (two) times daily. 3 Inhaler 3  . Cholecalciferol (VITAMIN D) 2000 UNITS CAPS Take 2,000 Units by mouth every morning. Take one tablet twice daily    . dextromethorphan-guaiFENesin (MUCINEX DM) 30-600 MG 12hr tablet Take 1-2 tablets by mouth every 12 hours as needed for cough, thick mucus, and congestion    . digoxin (LANOXIN) 0.125 MG tablet Take 0.125 mg by mouth every morning.     . furosemide (LASIX) 20 MG tablet Take 1 tablet (20 mg total) by mouth daily. (Patient taking differently: Take 20 mg by mouth every morning. ) 5 tablet 0  . lisinopril (PRINIVIL,ZESTRIL) 20 MG tablet Take 20 mg by mouth daily.    . metoprolol tartrate (LOPRESSOR) 25 MG tablet Take 0.5 tablets (12.5 mg total) by mouth 2 (two) times daily. 30 tablet 0  . Multiple Vitamins-Minerals (CENTRUM SILVER PO) Take by mouth daily.    Marland Kitchen olmesartan-hydrochlorothiazide (BENICAR HCT) 20-12.5 MG tablet Take 1 tablet by mouth daily.    . pravastatin (PRAVACHOL) 40 MG tablet Take 40 mg by mouth at bedtime.     . traZODone (DESYREL) 50 MG tablet Take 50 mg by mouth at bedtime as needed for sleep.    . valsartan (DIOVAN) 160 MG tablet Take 160 mg by mouth every morning.      No current facility-administered medications for this visit.    Functional Status: In your present state of health, do you have any difficulty performing the following activities: 01/21/2015 12/27/2014  Hearing? Tempie Donning  Vision? Y N  Difficulty concentrating or making decisions? Tempie Donning  Walking or climbing stairs? Y Y  Dressing or bathing? Y Y  Doing errands, shopping? Tempie Donning  Preparing Food and eating ? Y Y  Using the Toilet? Y Y  In the past six months, have you accidently leaked urine? Y Y  Do you have problems with loss of bowel control? Y Y  Managing your Medications? Y Y  Managing your Finances? Tempie Donning  Housekeeping or managing your Housekeeping? Tempie Donning    Fall/Depression Screening: PHQ 2/9 Scores 01/21/2015 12/27/2014 12/22/2014  PHQ - 2 Score 0 1 1   PHQ- 9 Score - 4 3    Assessment: 1.  Medication assistance:  Patient was approved for Symbicort patient assistance program and currently has all prescribed medications available.  No further pharmacy needs identified at this time.    Plan: 1.  Patient has achieved goal and Mrs. Longley reports no further pharmacy needs at this time.  Will close pharmacy program.  Encouraged Jonathen Rathman to call me if any she has difficulty with medication cost in the future.     [facility] CM Care Plan Problem One        Most Recent Value   Care Plan Problem One  Patient unable to afford Symbicort   Role Documenting the Problem One  Clinical Pharmacist   Care Plan for Problem One  Active   [facility] Long Term Goal (31-90 days)  Patient will submit application for Symbicort patient assistance in the next 30 days per patient report.    [facility] Long Term Goal Start Date  12/27/14   [facility] Long Term Goal Met Date  02/01/15   Interventions for Problem One Long Term Goal  Patient has been approved for Symbicort patient assistance program and has received a 3 month supply in the mail.      [name], Pharm.D. Pharmacy Resident [place] [phone]

## 2015-02-01 NOTE — Patient Outreach (Signed)
[facility]) Care Management  02/01/2015  [name] May 20, 1934 [id number]   [name] is a 80yo male who I am following for medication assistance. I called patient's wife, [name], to follow up on status of patient assistance application for Symbicort.  There was no answer.  I left a HIPAA compliant voicemail for [name] to return my call.     [name], Pharm.D. Pharmacy Resident [place] [phone]

## 2015-02-08 ENCOUNTER — Other Ambulatory Visit: Payer: Self-pay | Admitting: *Deleted

## 2015-02-08 NOTE — Patient Outreach (Addendum)
[facility]) Care Management  02/08/2015  DEVANTE PENNELLA 1934/11/24 [id number]   CSW received a message from Lurline Del, Care Management Assistant with [facility], reporting that she received a completed and signed FL-2 Form and labs, via fax, on patient.  CSW requested that Lattie Haw scan the documents into patient's chart and then give to Freda Jackson, Care Management Assistant, also with [facility].  CSW was able to get in contact with Harriet Pho, Admissions Coordinator at [facility], to report that patient's FL-2 Form is ready for submission, as [facility] is patient's wife, [name] Derocher's first choice for placement options for patient.  CSW was able to make contact with Mrs. Dube today to report patient's FL-2 Form has been completed, signed and submitted to [facility] for processing. CSW agreed to follow-up with Mrs. Aline Brochure when bed offered received.  [name], BSW, MSW, LCSW  Licensed Education officer, environmental Health System  Mailing [place] [address], [place], [place] [address] Physical Address-[address], [place], [place] [address] Toll Free Main # [phone] Fax # [phone] Cell # [phone]  Fax # [phone]  [name]

## 2015-02-09 IMAGING — CR DG CHEST 2V
3 series · 3 of 3 positions shown · non-contrast
Comparison: 04/05/2011

CLINICAL DATA: Sob, copd, confusion, hx of alzheimers, htn, hx of
colon Ca

CHEST - 2 VIEW

[w chest lat]
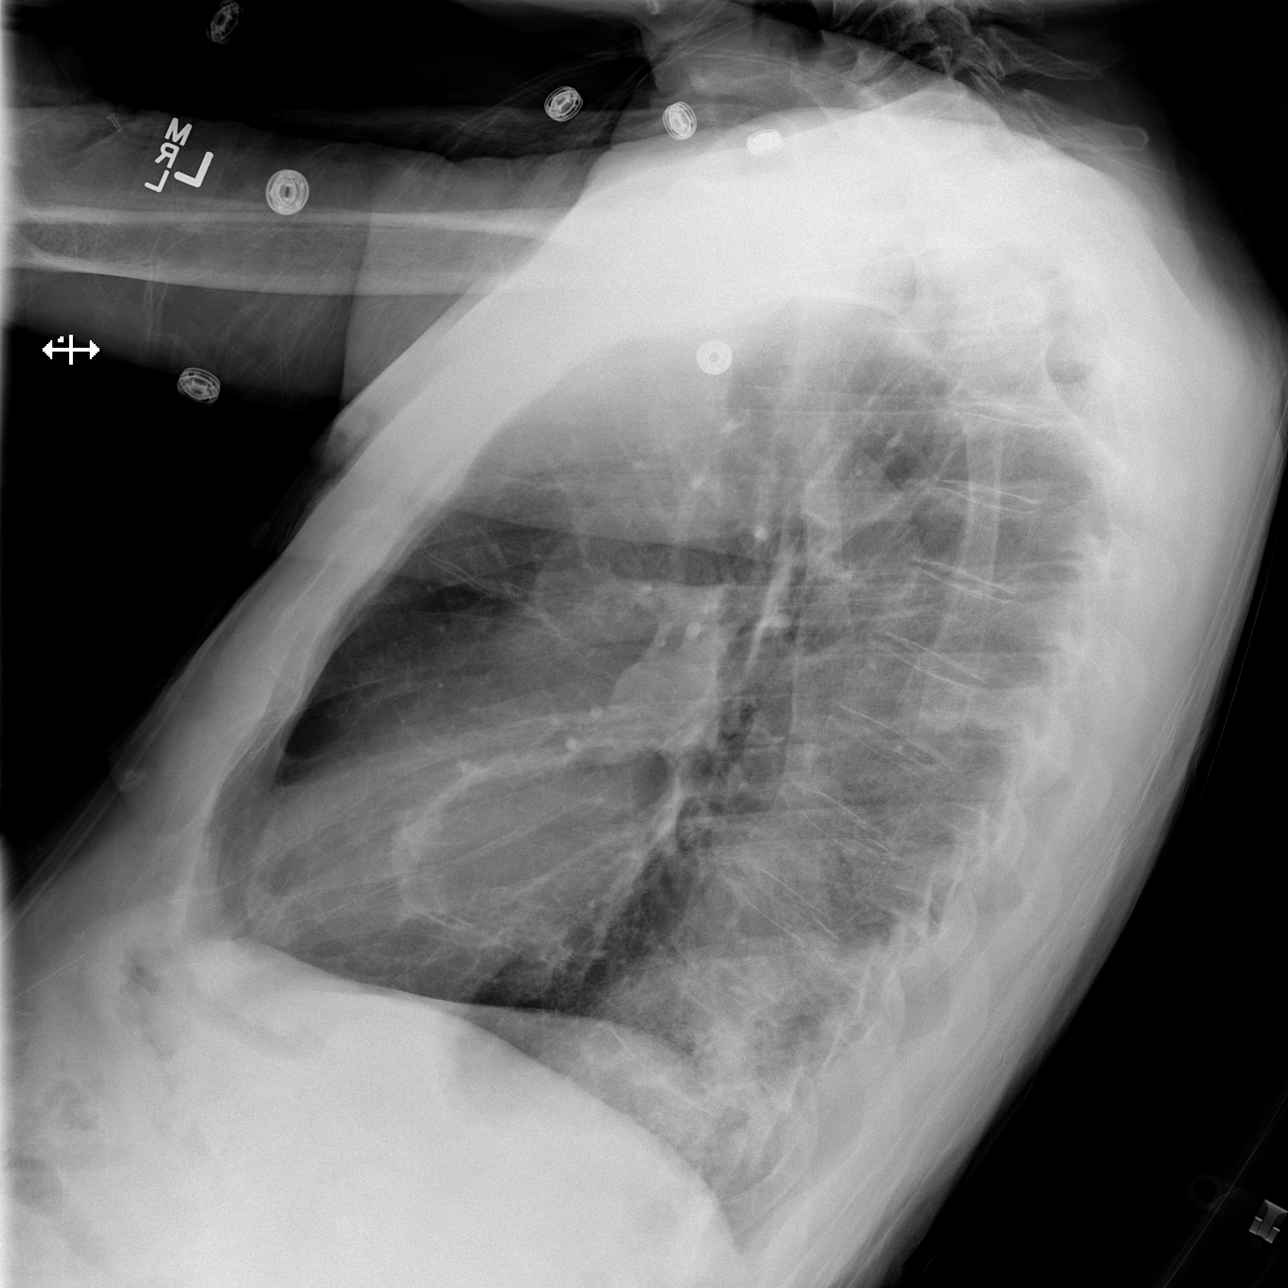

[x chest ap (1 of 2)]
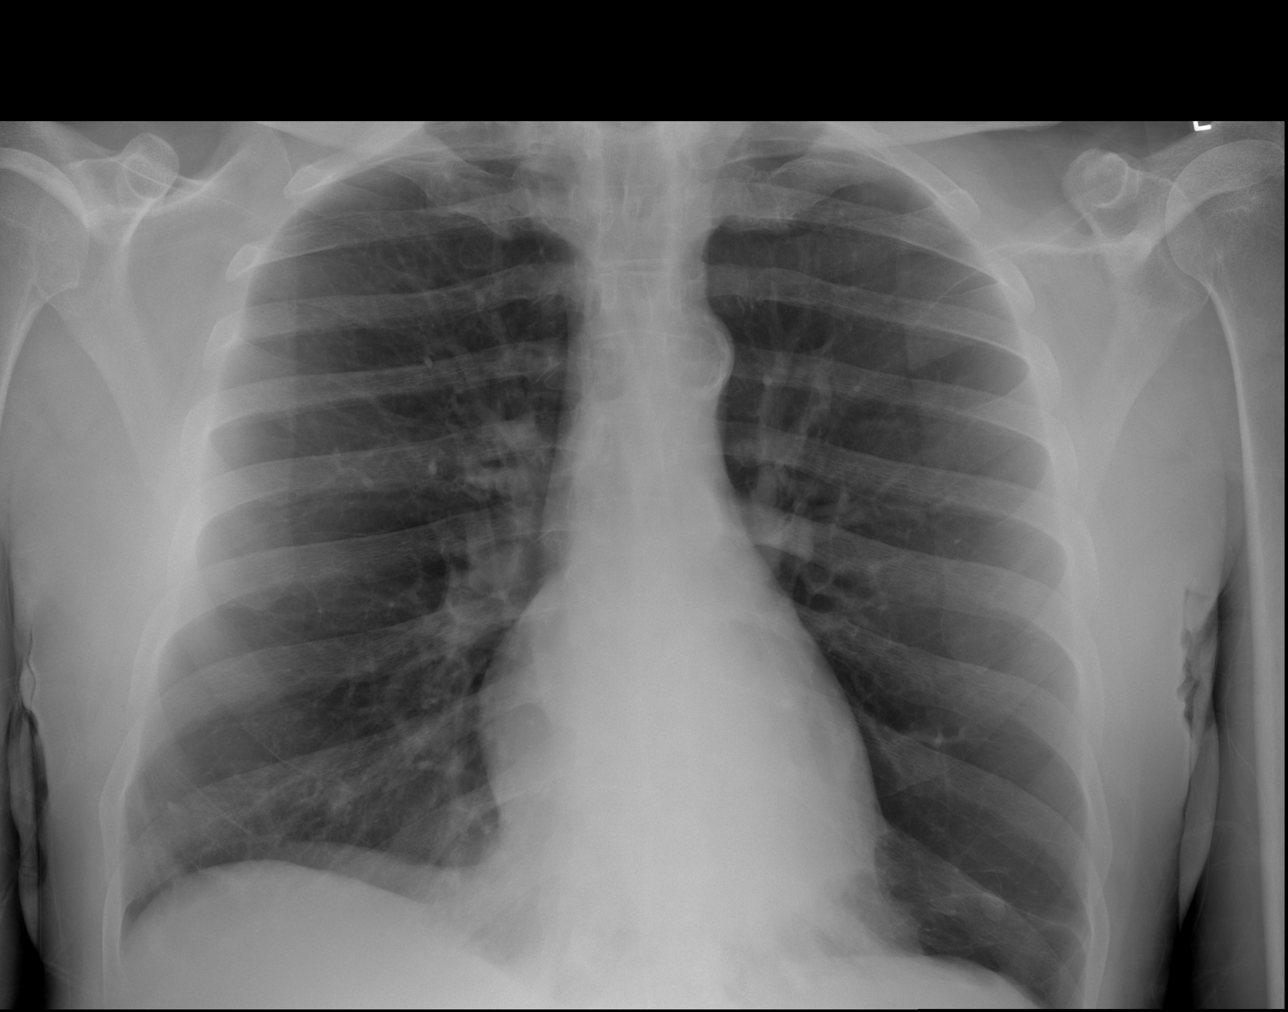

[x chest ap (2 of 2)]
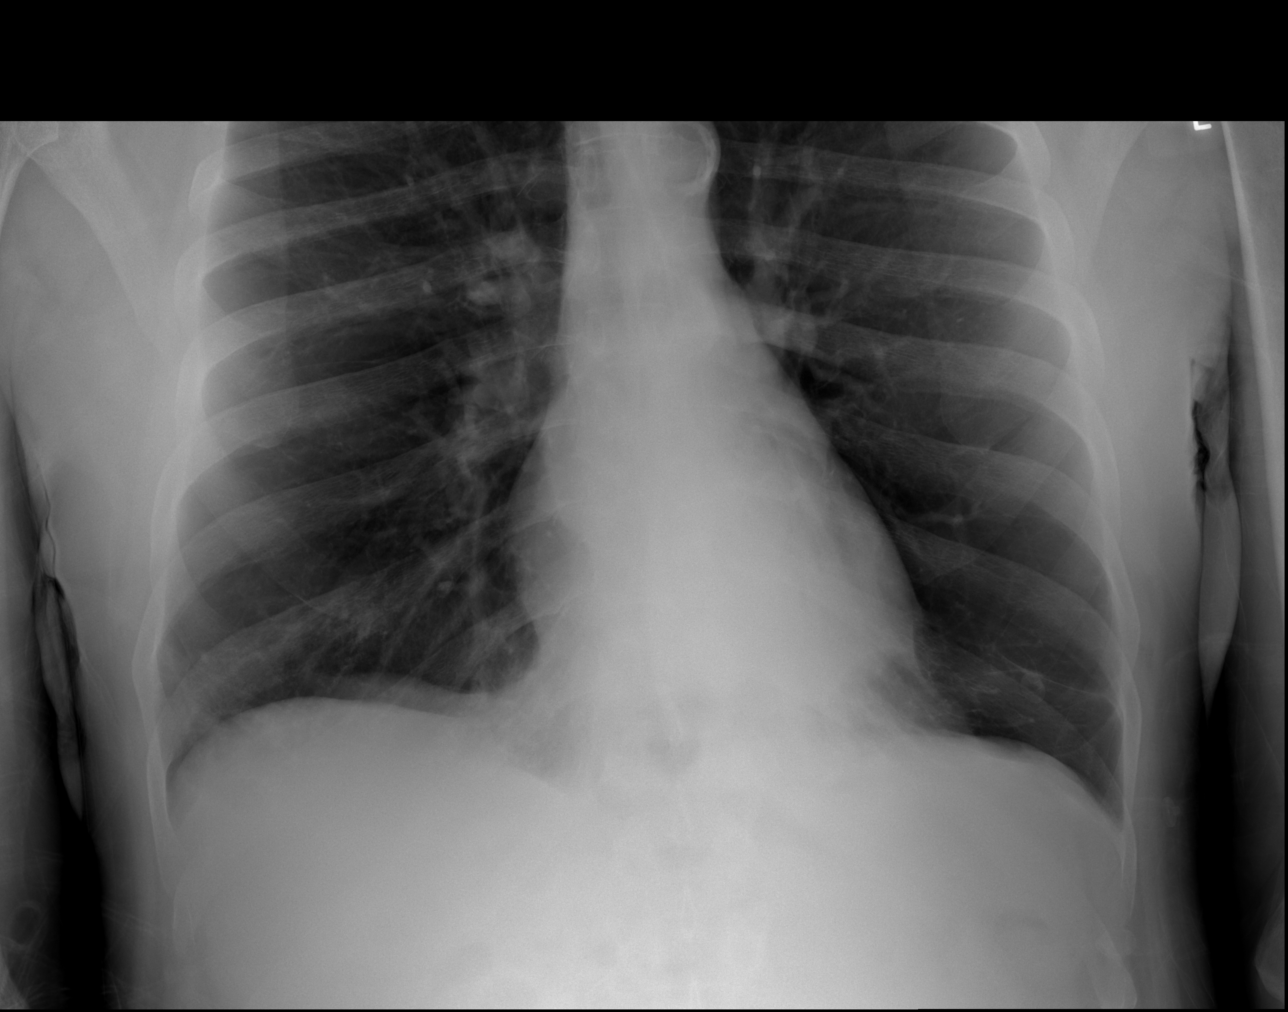

[3 of 3 positions shown; findings below may reference images not displayed]

FINDINGS: Atherosclerotic calcification of the aortic arch noted.
Cardiac and mediastinal contours appear unremarkable.

Retrocardiac airspace opacity is new.  Faint linear and nodular
opacities noted at the right lung base.  Subsegmental atelectasis
or scarring in the left lower lobe.  Left posterior costophrenic
angle is indistinct.
IMPRESSION: 1.  Retrocardiac airspace opacity compatible with left lower lobe
pneumonia or atelectasis.  I recommend either follow-up chest
radiography to ensure clearance, or chest CT.
2.  Faint linear and nodular opacities at the right lung base are
likely inflammatory but also merits observation to ensure
resolution.

## 2015-02-14 ENCOUNTER — Encounter: Payer: Self-pay | Admitting: *Deleted

## 2015-02-14 ENCOUNTER — Other Ambulatory Visit: Payer: Self-pay | Admitting: *Deleted

## 2015-02-14 NOTE — Patient Outreach (Signed)
[facility]) Care Management   02/14/2015  [name] May 22, 1934 [id number]  [name] is an 80 y.o. male  Subjective: Patient states "I've been doing pretty good".  Wife reports that patient has his "ups and downs, but going downhill fast". "He is up a lot during the night". "He sits in the bathroom for hours".  Objective:  BP 130/68 mmHg  Pulse 69  Resp 20  SpO2 98%  Review of Systems  Constitutional: Negative.   HENT: Negative.   Eyes: Negative.        Wears eyeglasses  Respiratory: Positive for cough. Negative for shortness of breath and wheezing.        Lungs clear to auscultation Wet cough per usual but expectorates no mucus Breathing even and non-labored  Cardiovascular: Negative for leg swelling.       Regular rate and rhythm  Gastrointestinal: Negative for nausea and vomiting.       Incontinent of bowel at times- wears Depends Abdomen soft, non-tender Bowel sounds present  Genitourinary:       Patient incontinent of bladder-- wears Depends  Musculoskeletal: Positive for joint pain and falls.       History of fall  Skin: Negative.   Neurological: Negative.   Endo/Heme/Allergies: Negative.   Psychiatric/Behavioral: Positive for memory loss.       Has Alzheimer's dementia    Physical Exam  Current Medications:   Current Outpatient Prescriptions  Medication Sig Dispense Refill  . albuterol (PROVENTIL) (5 MG/ML) 0.5% nebulizer solution Take 2.5 mg by nebulization every 6 (six) hours as needed for shortness of breath.    Marland Kitchen aspirin 81 MG tablet Take 81 mg by mouth every morning.     . budesonide-formoterol (SYMBICORT) 160-4.5 MCG/ACT inhaler Inhale 2 puffs into the lungs 2 (two) times daily. 3 Inhaler 3  . Cholecalciferol (VITAMIN D) 2000 UNITS CAPS Take 2,000 Units by mouth every morning. Take one tablet twice daily    . digoxin (LANOXIN) 0.125 MG tablet Take 0.125 mg by mouth every morning.     . furosemide (LASIX) 20 MG tablet Take 1  tablet (20 mg total) by mouth daily. (Patient taking differently: Take 20 mg by mouth every morning. ) 5 tablet 0  . metoprolol tartrate (LOPRESSOR) 25 MG tablet Take 0.5 tablets (12.5 mg total) by mouth 2 (two) times daily. 30 tablet 0  . Multiple Vitamins-Minerals (CENTRUM SILVER PO) Take by mouth daily.    Marland Kitchen olmesartan-hydrochlorothiazide (BENICAR HCT) 20-12.5 MG tablet Take 1 tablet by mouth daily.    . pravastatin (PRAVACHOL) 40 MG tablet Take 40 mg by mouth at bedtime.     . traZODone (DESYREL) 50 MG tablet Take 50 mg by mouth at bedtime as needed for sleep.    . Aclidinium Bromide (TUDORZA PRESSAIR) 400 MCG/ACT AEPB Inhale 1 puff into the lungs 2 (two) times daily. One twice daily (Patient not taking: Reported on 01/13/2015)    . albuterol (PROVENTIL HFA;VENTOLIN HFA) 108 (90 BASE) MCG/ACT inhaler Inhale 2 puffs into the lungs every 6 (six) hours as needed for shortness of breath. (Patient not taking: Reported on 02/14/2015) 1 Inhaler 5  . arformoterol (BROVANA) 15 MCG/2ML NEBU Take 2 mLs (15 mcg total) by nebulization 2 (two) times daily. (Patient not taking: Reported on 12/29/2014) 120 mL 1  . budesonide (PULMICORT) 0.25 MG/2ML nebulizer solution Take 2 mLs (0.25 mg total) by nebulization 2 (two) times daily. (Patient not taking: Reported on 12/27/2014) 120 mL 2  .  dextromethorphan-guaiFENesin (MUCINEX DM) 30-600 MG 12hr tablet Take 1-2 tablets by mouth every 12 hours as needed for cough, thick mucus, and congestion    . lisinopril (PRINIVIL,ZESTRIL) 20 MG tablet Take 20 mg by mouth daily.    . valsartan (DIOVAN) 160 MG tablet Take 160 mg by mouth every morning.      No current facility-administered medications for this visit.    Functional Status:   In your present state of health, do you have any difficulty performing the following activities: 01/21/2015 12/27/2014  Hearing? Tempie Donning  Vision? Y N  Difficulty concentrating or making decisions? Tempie Donning  Walking or climbing stairs? Y Y  Dressing  or bathing? Y Y  Doing errands, shopping? Tempie Donning  Preparing Food and eating ? Y Y  Using the Toilet? Y Y  In the past six months, have you accidently leaked urine? Y Y  Do you have problems with loss of bowel control? Y Y  Managing your Medications? Y Y  Managing your Finances? Tempie Donning  Housekeeping or managing your Housekeeping? Tempie Donning    Fall/Depression Screening:    PHQ 2/9 Scores 02/14/2015 01/21/2015 12/27/2014 12/22/2014  PHQ - 2 Score 1 0 1 1  PHQ- 9 Score 6 - 4 3    Assessment:   80 year old male who is seen for Chronic Obstructive Pulmonary Disease. Arrived at patient 's home. Wife is at home waiting for patient who is coming from [facility] Mrs. Pollard seemed lesser worried and anxious today than last visit. She still gets easily tensed and "stirred up" with discussions on  COPD management, signs and symptoms of COPD flare up and when to call the doctor or rescue squad. Review on COPD done with wife using  COPD magnet placed on the refrigerator door and using teach back method. COPD magnet placement maintained for her easy visualization and quick reference.   Per wife's report, patient still has his usual cough but no mucus expectorated. Patient's breathing is non-labored and appetite is good.  She reports using Albuterol about 3 times for the past month when patient starts exerting effort with breathing due to congestion and it keeps patient right on track per wife. According to wife, patient had an incident of sudden complaint of chest pain (11/1) where she has to call the rescue squad. Patient's condition resolved with no need to bring patient to the emergency room or hospital. Primary care provider was made aware of the incident per patient's wife.   Ms. Whipp shares that [facility] social worker is currently working with her for patient's placement to an Alzheimer's unit. Per [facility] social worker's note, a completed FL2 form was submitted to [facility] and presently awaiting for bed offer.   Assisted patient's wife to watch EMMI video on Dementia for caregivers during this visit and she was thankful for it. She also shared that she is with a support group for 6 years and attending conferences regarding Alzheimer's dementia. Personal care aide continues to come to their house 2 hours a day Monday to Friday to provide assistance.  Ms. Coke reports trying to provide activities for patient like taking him for a walk, playing scrabble, going over pictures and albums to keep patient awake on the day so he can get longer sleeps at night but it does not always work per wife.  Patient was able to tolerate sitting at the table with wife and care management coordinator and included him on  the discussions during this visit.  Patient's wife denies any needs or concerns at this time. She agreed to home visit next month. Wife is aware to call [facility], care management coordinator and 24-hour nurse line when needed.    Plan: Routine home visit 12/22. Will await for bed offer from [facility].   THN CM Care Plan Problem One        Most Recent Value   Care Plan Problem One  knowledge deficit regarding COPD   Role Documenting the Problem One  Care Management Coordinator   Care Plan for Problem One  Active   THN Long Term Goal (31-90 days)  Patient's wife will be able to verbalize at least 3 strategies in managing COPD in the next 60 days   THN Long Term Goal Start Date  12/22/14   Interventions for Problem One Long Term Goal  use teach back method to review [facility] COPD packet provided,  review COPD zone tool,  review COPD action plan,  remind to continue medication adherence,  emphasize attending follow-up doctor's' appointments,  reiterate avoidance of environmental irritants,  discuss use of breathing treatments like inhalers and nebulization   THN CM Short Term Goal #1 (0-30 days)  patient's wife will be able to identify the 3 different COPD  zones in the next 30 days   THN CM Short Term Goal #1 Start Date  12/22/14   [facility] CM Short Term Goal #1 Met Date  02/14/15 [Goal met]   THN CM Short Term Goal #2 (0-30 days)  patient's wife will verbalize COPD action plan in the next 30 days   THN CM Short Term Goal #2 Start Date  12/22/14   [facility] CM Short Term Goal #2 Met Date  02/14/15 [Goal met]   THN CM Short Term Goal #3 (0-30 days)  patient's wife will identify at least 3 signs and symptoms of COPD flare-up and when to call the doctor in the next 30 days   THN CM Short Term Goal #3 Start Date  12/22/14   [facility] CM Short Term Goal #3 Met Date  -- [continue with goal-- restated]   Interventions for Short Tern Goal #3  use teach back method to review signs and symptoms of COPD exacerbation like shortness of breath, increased coughing,  thicker and discolored mucus,  decrease appetite and fever and know when to call the doctor for help,  COPD magnet maintained on refrigerator door for easy visualization,  review COPD action plan and use if patient starts to manifest signd and symptoms of COPD flare-up,  encourage to evaluate patient's daily condition using COPD zone tool,  encourage wife to review [facility] COPD packet of information and ask questions      Edwena Felty A. Reha Martinovich, BSN, RN-BC [place] Management Coordinator Cell: [phone]

## 2015-02-15 ENCOUNTER — Other Ambulatory Visit: Payer: Commercial Managed Care - HMO | Admitting: *Deleted

## 2015-02-15 ENCOUNTER — Encounter: Payer: Self-pay | Admitting: *Deleted

## 2015-02-15 NOTE — Patient Outreach (Signed)
[facility]) Care Management  02/15/2015  OVE AMAT 1934/11/29 [id number]  Ozora made an attempt to try and contact Harriet Pho, Admissions Director at [facility], [place] where patient's wife, Oron Poppen would like for patient to be placed for long-term care, but there was no answer.  A HIPAA compliant message was left for patient on voicemail.  CSW is currently awaiting a return call. CSW also made an attempt to try and contact patient's wife, Madelyn Leet today to follow-up regarding long-term care placement arrangements for patient; however, Mrs. Hatz was not available.  A HIPAA compliant message was left on voicemail.  [name], BSW, MSW, LCSW  Licensed Education officer, environmental Health System  Mailing [place] [address] [place], [place] [address] Physical Address-[address], [place], [place] [address] Toll Free Main # [phone] Fax # [phone] Cell # [phone]  Fax # [phone]  [name]

## 2015-02-21 ENCOUNTER — Other Ambulatory Visit: Payer: Commercial Managed Care - HMO | Admitting: *Deleted

## 2015-02-21 NOTE — Patient Outreach (Signed)
[facility]) Care Management  02/21/2015  ERNAN NAHAR [birth year] [id number]   CSW made an attempt to try and contact patient's wife, Iaan Krabill today to follow-up regarding long-term care placement arrangements for patient; however, Mrs Chavira was not available at the time of CSW's calls.  A HIPPA compliant message was left for Mrs. Aline Brochure on [employer].  CSW is currently awaiting a return call.  [name], BSW, MSW, LCSW  Licensed Education officer, environmental Health System  Mailing [place] [address], [place], [place] [address] Physical Address-[address], [place], [place] [address] Toll Free Main # [phone] Fax # [phone] Cell # [phone]  Fax # [phone]  [name]

## 2015-02-22 ENCOUNTER — Other Ambulatory Visit: Payer: Self-pay | Admitting: *Deleted

## 2015-02-22 NOTE — Patient Outreach (Signed)
[facility]) Care Management  02/22/2015  LENNIX WILTGEN [birth year] [id number]   Vidette received a return call from patient's wife, Tyheim Metter today in response to the voicemail message that CSW left for her on November 28th, regarding long-term care placement arrangements for patient.  Mrs. Gwynn admitted that she still has not received a return call from Harriet Pho, Admissions Director with [facility], regarding long-term care placement arrangements for patient.  CSW encouraged Mrs. Rayos to give Mrs. Megan Salon another call attempt.  In addition, CSW left a voicemail message for Mrs. Megan Salon, as well as submitted a HIPAA compliant text message.  CSW is awaiting a response.  In the meantime, CSW encouraged Mrs. Hackley to visit the [employer] to apply for [facility] coverage for patient.  Mrs. Bach voiced understanding and agreed to follow-up with CSW as soon as the Long-Term Care Medicaid application is submitted for processing.  [name], BSW, MSW, LCSW  Licensed Education officer, environmental Health System  Mailing [place] [address] [place], [place] [address] Physical Address-[address], [place], [place] [address] Toll Free Main # [phone] Fax # [phone] Cell # [phone]  Fax # [phone]  [name]

## 2015-03-17 ENCOUNTER — Other Ambulatory Visit: Payer: Self-pay | Admitting: Licensed Clinical Social Worker

## 2015-03-17 ENCOUNTER — Encounter: Payer: Self-pay | Admitting: *Deleted

## 2015-03-17 ENCOUNTER — Other Ambulatory Visit: Payer: Self-pay | Admitting: *Deleted

## 2015-03-17 VITALS — BP 140/68 | HR 60 | Resp 18

## 2015-03-17 DIAGNOSIS — J441 Chronic obstructive pulmonary disease with (acute) exacerbation: Secondary | ICD-10-CM

## 2015-03-17 NOTE — Patient Outreach (Addendum)
[facility]) Care Management   03/17/2015  KAYSON TASKER 07-Mar-1935 [id number]  CARSEN LEAF is an 80 y.o. male  Subjective: Patient states "doing pretty good". Patient's wife states "been fine except for his memory". Wife reports patient "has nights and days mixed up and he is up at night wandering around the house".  Objective:  BP 140/68 mmHg  Pulse 60  Resp 18  SpO2 96%  Review of Systems  Constitutional: Negative.   HENT: Positive for congestion.   Eyes: Negative.        Wears eyeglasses  Respiratory: Positive for cough. Negative for sputum production, shortness of breath and wheezing.        Wet cough per usual but no mucus expectorated Scattered rhonchi throughout lung fields Breathing unlabored  Cardiovascular: Negative.  Negative for leg swelling.       Regular rate and rhythm  Gastrointestinal: Negative for nausea and vomiting.       Incontinent of bowel- wears depends Abdomen soft, not distended, non tender Bowel sounds present  Genitourinary: Negative.        Incontinent of urine- wears depends  Musculoskeletal:       History of fall  Neurological: Negative.   Endo/Heme/Allergies: Negative.   Psychiatric/Behavioral: Positive for memory loss.       Has Alzheimer's dementia    Physical Exam  Current Medications:   Current Outpatient Prescriptions  Medication Sig Dispense Refill  . albuterol (PROVENTIL) (5 MG/ML) 0.5% nebulizer solution Take 2.5 mg by nebulization every 6 (six) hours as needed for shortness of breath.    Marland Kitchen aspirin 81 MG tablet Take 81 mg by mouth every morning.     . budesonide-formoterol (SYMBICORT) 160-4.5 MCG/ACT inhaler Inhale 2 puffs into the lungs 2 (two) times daily. 3 Inhaler 3  . Cholecalciferol (VITAMIN D) 2000 UNITS CAPS Take 2,000 Units by mouth every morning. Take one tablet twice daily    . digoxin (LANOXIN) 0.125 MG tablet Take 0.125 mg by mouth every morning.     . furosemide (LASIX) 20 MG tablet Take 1  tablet (20 mg total) by mouth daily. (Patient taking differently: Take 20 mg by mouth every morning. ) 5 tablet 0  . metoprolol tartrate (LOPRESSOR) 25 MG tablet Take 0.5 tablets (12.5 mg total) by mouth 2 (two) times daily. 30 tablet 0  . Multiple Vitamins-Minerals (CENTRUM SILVER PO) Take by mouth daily.    Marland Kitchen olmesartan-hydrochlorothiazide (BENICAR HCT) 20-12.5 MG tablet Take 1 tablet by mouth daily.    . pravastatin (PRAVACHOL) 40 MG tablet Take 40 mg by mouth at bedtime.     . traZODone (DESYREL) 50 MG tablet Take 50 mg by mouth at bedtime as needed for sleep.    . Aclidinium Bromide (TUDORZA PRESSAIR) 400 MCG/ACT AEPB Inhale 1 puff into the lungs 2 (two) times daily. One twice daily (Patient not taking: Reported on 01/13/2015)    . albuterol (PROVENTIL HFA;VENTOLIN HFA) 108 (90 BASE) MCG/ACT inhaler Inhale 2 puffs into the lungs every 6 (six) hours as needed for shortness of breath. (Patient not taking: Reported on 02/14/2015) 1 Inhaler 5  . arformoterol (BROVANA) 15 MCG/2ML NEBU Take 2 mLs (15 mcg total) by nebulization 2 (two) times daily. (Patient not taking: Reported on 12/29/2014) 120 mL 1  . budesonide (PULMICORT) 0.25 MG/2ML nebulizer solution Take 2 mLs (0.25 mg total) by nebulization 2 (two) times daily. (Patient not taking: Reported on 12/27/2014) 120 mL 2  . dextromethorphan-guaiFENesin (MUCINEX DM) 30-600 MG 12hr  tablet Reported on 03/17/2015    . lisinopril (PRINIVIL,ZESTRIL) 20 MG tablet Take 20 mg by mouth daily. Reported on 03/17/2015    . valsartan (DIOVAN) 160 MG tablet Take 160 mg by mouth every morning. Reported on 03/17/2015     No current facility-administered medications for this visit.    Functional Status:   In your present state of health, do you have any difficulty performing the following activities: 03/17/2015 01/21/2015  Hearing? Tempie Donning  Vision? N Y  Difficulty concentrating or making decisions? Tempie Donning  Walking or climbing stairs? N Y  Dressing or bathing? Y Y   Doing errands, shopping? Tempie Donning  Preparing Food and eating ? Y Y  Using the Toilet? Y Y  In the past six months, have you accidently leaked urine? Y Y  Do you have problems with loss of bowel control? Y Y  Managing your Medications? Y Y  Managing your Finances? Tempie Donning  Housekeeping or managing your Housekeeping? Tempie Donning    Fall/Depression Screening:    PHQ 2/9 Scores 03/17/2015 02/14/2015 01/21/2015 12/27/2014 12/22/2014  PHQ - 2 Score 1 1 0 1 1  PHQ- 9 Score 6 6 - 4 3    Assessment:   80 year old male being seen for Chronic Obstructive Pulmonary Disease, with history of Alzheimer's dementia, heart failure, hypertension and diabetes type 2- well controlled. Patient is not on any diabetic medication. Arrived at patient 's home. Patient and wife lives on a one-level house with few steps (with railings) on the front door. Wife is at home waiting for patient to come from [facility]). Patient's wife reports no breathing problem noted on patient, just his usual cough without observed mucus expectorant and appetite remains good as stated.  Mrs. Emert seemed anxious today stating that she has not heard anymore about the patient's placement and worried that they "will be back on square one".  She further states that she went to DSS (Department of Social Services) as instructed by [place] worker [name]) and she was told there that she would have a limited amount of time to get patient placed after completing the long term Medicaid application. She also reports being told that it would be best if this application be done when patient is being discharged from a hospital or Rehab center.   Patient also mentioned that a Education officer, museum from [employer] (Department of Social Services) met with her at home and wife states it got her confused when she was told by this Education officer, museum that they will take over care management and personal care services of patient.  Call placed to [facility] social worker and  spoke with [employer] covering for J. Saporito to get some clarifications. Ms. Cottrill and [facility] social worker Nichola Sizer) had a phone conversation. [place] social worker provided resources and education to patient's wife. She was encouraged to call and follow-up with [facility] and to call and get further directions on placement process from [place] (J. Saporito) when she will be back on Monday 12/27.  Ms. Nichola Sizer informed care management coordinator that she will update Ms. Derrill Center regarding Ms. Sadowski's issues and concerns. Patient's wife placed a call to [facility] but unable to reach [employer] (admissions). She left a message and contact number to be called back.  Ms. Pharo still gets easily tensed and "stirred up" with discussions on:  Strategies on COPD management, signs and symptoms of COPD flare  up and when to call the doctor for help.  Need for constant reinforcement is identified and she agreed to be referred to Health Coach for further discussion of disease management process to include Heart Failure as well. Review and recall on COPD was done with patient's wife using teach back method. COPD magnet on refrigerator door was used and maintained for her easy access/ visualization and quick reference.   Patient's wife mentioned that she continues to be with Alzheimer's support group for 6 years and she attends conferences regarding Alzheimer's dementia but she does not deny her feelings of being overwhelmed and burn out at times. She verbalized looking forward to talking with someone to vent out her feelings that gives her some relief. Ms. Virts continues to manage patient's medications. She states being aware of the importance in getting Benicar supply provided by doctor's office at no cost since patient is already out of supply. Personal care aide continues to come to their house 2 hours a day Monday to Friday to provide assistance to patient  and ease off some burden of wife. Patient continues to attend the [facility] 4 hours a day, 4 days a week.   Patient is included in the discussion although he dozes on and off during this visit.   Patient's wife denies other needs or concerns at this time. She agreed to be transitioned to health coach for further discussion of disease management.  Wife is aware to call [facility], care management coordinator and 24-hour nurse line when needed.   Plan: Will refer to [facility] for further disease management. Will follow-up with [facility] social worker regarding progress of placement. Will transition patient to Health Coach pending placement, however, [facility] social worker will continue to be involved in the case.   THN CM Care Plan Problem One        Most Recent Value   Care Plan Problem One  knowledge deficit regarding COPD   Role Documenting the Problem One  Care Management Coordinator   Care Plan for Problem One  Active   THN Long Term Goal (31-90 days)  Patient's wife will be able to verbalize at least 3 strategies in managing COPD in the next 60 days   THN Long Term Goal Start Date  12/22/14   Interventions for Problem One Long Term Goal  clarify informations that wife gets mixed up regarding COPD (such as COPD action plan,  signs and symptoms of COPD exacerbation and when to call the doctor for help),  use teach back method to review and verify COPD management,  review COPD zone tool (help her remember using the different traffic lights),  encourage to continue medication management for adherence,  emphasize importance of attending follow-up doctor's appointments,  remind to avoid environmental irritants that may trigger COPD,  discuss use of breathing treatments like inhaler and nebulization   THN CM Short Term Goal #1 (0-30 days)  patient's wife will be able to identify the 3 different COPD zones in the next 30 days   THN CM Short Term Goal #1 Start Date  12/22/14   [facility] CM Short  Term Goal #1 Met Date  02/14/15 [Goal met]   THN CM Short Term Goal #2 (0-30 days)  patient's wife will verbalize COPD action plan in the next 30 days   THN CM Short Term Goal #2 Start Date  12/22/14   [facility] CM Short Term Goal #2 Met Date  02/14/15 [Goal met- needs reinforcement (wife easily  gets confused)]   THN CM Short Term Goal #3 (0-30 days)  patient's wife will identify at least 3 signs and symptoms of COPD flare-up and when to call the doctor in the next 30 days   THN CM Short Term Goal #3 Start Date  12/22/14   [facility] CM Short Term Goal #3 Met Date  03/17/15 [Goal met- need to reinforce since wife gets easily mixed-up]       Edwena Felty A. Merdith Boyd, BSN, RN-BC [place] Management Coordinator Cell: [phone]

## 2015-03-17 NOTE — Patient Outreach (Signed)
[facility]) Care Management  03/17/2015  CAZ WESTERMEYER [birth year] [id number]   Assessment-This CSW is covering for [place]. CSW received call from [facility]. RNCM put patient's wife on the phone. Wife informed CSW that she has been trying to get patient placed into a facility and went to DSS but it was informed that she would only have a limited amount of time to get patient placed after completing long term medicaid application. Wife stated that she did not complete long term medicaid application. CSW provided resources and education on patient's plan of care. Wife had questions in regards to receiving personal care services within the home which sounded as she had changed her mind in the decision to place patient. However, later in the conversation she stated that her goal is to place patient. After completing chart review, CSW became aware that [name] has been working to place patient at [facility] center. Per CSW's note, FL2 has been completed as well. CSW advised patient's wife to contact [facility] and wife was encouraged to contact [facility] on 03/22/15 to get further direction on placement process.  Plan-CSW will update CSW [name] and remain available to patient during this coverage.  [name], BSW, MSW, [email or web address] Phone: [phone] Fax: [phone]

## 2015-03-22 ENCOUNTER — Other Ambulatory Visit: Payer: Self-pay

## 2015-03-22 DIAGNOSIS — R05 Cough: Secondary | ICD-10-CM

## 2015-03-22 DIAGNOSIS — R059 Cough, unspecified: Secondary | ICD-10-CM

## 2015-03-22 NOTE — Patient Outreach (Addendum)
[facility]) Care Management  [place]  03/22/2015   [name] 1935-03-13 [id number]  Telephonic contact today. Spoke with POA [name] (wife). Wife reports patient's cough is improved with use of OTC Mucinex DM, and that he has had no episodes of acute respiratory distress.  Patient does not use Oxygen.  The wife's chief concern today is the need to find placement for the patient.  He has Alzheimer's disease and is prone to wandering at night, turning on lights or water, and needs supervision.  Wife also states he is incontinent of bowel and bladder.  She has not been able to obtain placement for patient.  [facility] SW is involved in efforts to assist with placement.  Current Medications:  Current Outpatient Prescriptions  Medication Sig Dispense Refill  . albuterol (PROVENTIL HFA;VENTOLIN HFA) 108 (90 BASE) MCG/ACT inhaler Inhale 2 puffs into the lungs every 6 (six) hours as needed for shortness of breath. 1 Inhaler 5  . albuterol (PROVENTIL) (5 MG/ML) 0.5% nebulizer solution Take 2.5 mg by nebulization every 6 (six) hours as needed for shortness of breath.    . budesonide (PULMICORT) 0.25 MG/2ML nebulizer solution Take 2 mLs (0.25 mg total) by nebulization 2 (two) times daily. 120 mL 2  . budesonide-formoterol (SYMBICORT) 160-4.5 MCG/ACT inhaler Inhale 2 puffs into the lungs 2 (two) times daily. 3 Inhaler 3  . Cholecalciferol (VITAMIN D) 2000 UNITS CAPS Take 2,000 Units by mouth every morning. Take one tablet twice daily    . dextromethorphan-guaiFENesin ([place] DM) 30-600 MG 12hr tablet Reported on 03/17/2015    . digoxin (LANOXIN) 0.125 MG tablet Take 0.125 mg by mouth every morning.     . furosemide (LASIX) 20 MG tablet Take 1 tablet (20 mg total) by mouth daily. (Patient taking differently: Take 20 mg by mouth every morning. ) 5 tablet 0  . lisinopril (PRINIVIL,ZESTRIL) 20 MG tablet Take 20 mg by mouth daily. Reported on 03/17/2015    . metoprolol  tartrate (LOPRESSOR) 25 MG tablet Take 0.5 tablets (12.5 mg total) by mouth 2 (two) times daily. 30 tablet 0  . Multiple Vitamins-Minerals (CENTRUM SILVER PO) Take by mouth daily.    . pravastatin (PRAVACHOL) 40 MG tablet Take 40 mg by mouth at bedtime.     . traZODone (DESYREL) 50 MG tablet Take 50 mg by mouth at bedtime as needed for sleep.    . Aclidinium Bromide (TUDORZA PRESSAIR) 400 MCG/ACT AEPB Inhale 1 puff into the lungs 2 (two) times daily. One twice daily (Patient not taking: Reported on 01/13/2015)    . arformoterol (BROVANA) 15 MCG/2ML NEBU Take 2 mLs (15 mcg total) by nebulization 2 (two) times daily. (Patient not taking: Reported on 12/29/2014) 120 mL 1  . aspirin 81 MG tablet Take 81 mg by mouth every morning.     . olmesartan-hydrochlorothiazide (BENICAR HCT) 20-12.5 MG tablet Take 1 tablet by mouth daily. Reported on 03/22/2015    . valsartan (DIOVAN) 160 MG tablet Take 160 mg by mouth every morning. Reported on 03/22/2015     No current facility-administered medications for this visit.    Functional Status:  In your present state of health, do you have any difficulty performing the following activities: 03/22/2015 03/17/2015  Hearing? Tempie Donning  Vision? N N  Difficulty concentrating or making decisions? Tempie Donning  Walking or climbing stairs? N N  Dressing or bathing? Y Y  Doing errands, shopping? Tempie Donning  Preparing Food and eating ? Tempie Donning  Using the Toilet? Y Y  In the past six months, have you accidently leaked urine? Y Y  Do you have problems with loss of bowel control? Y Y  Managing your Medications? Y Y  Managing your Finances? Tempie Donning  Housekeeping or managing your Housekeeping? Tempie Donning    Fall/Depression Screening: PHQ 2/9 Scores 03/17/2015 02/14/2015 01/21/2015 12/27/2014 12/22/2014  PHQ - 2 Score 1 1 0 1 1  PHQ- 9 Score 6 6 - 4 3   [facility] CM Care Plan Problem One        Most Recent Value   Care Plan Problem One  knowledge deficit regarding COPD   Role Documenting the Problem One   Health [facility] for Problem One  Active   THN Long Term Goal (31-90 days)  Patient's wife will be able to verbalize at least 3 strategies in managing COPD in the next 60 days   THN Long Term Goal Start Date  12/22/14   Interventions for Problem One Long Term Goal  -- [Reinforced COPD Actoin Plan with wife.]   [facility] CM Short Term Goal #1 (0-30 days)  patient's wife will be able to identify the 3 different COPD zones in the next 30 days   [facility]N CM Short Term Goal #1 Start Date  12/22/14   [facility] CM Short Term Goal #1 Met Date  02/14/15 [Goal met]   THN CM Short Term Goal #2 (0-30 days)  patient's wife will verbalize COPD action plan in the next 30 days   [facility]N CM Short Term Goal #2 Start Date  12/22/14   [facility] CM Short Term Goal #2 Met Date  02/14/15 [Goal met- needs reinforcement (wife easily gets confused)]   Interventions for Short Term Goal #2  Reinforced teaching re COPD action plan with wife.   [facility] CM Short Term Goal #3 (0-30 days)  patient's wife will identify at least 3 signs and symptoms of COPD flare-up and when to call the doctor in the next 30 days   [facility]N CM Short Term Goal #3 Start Date  12/22/14   [facility] CM Short Term Goal #3 Met Date  03/17/15 [Goal met- need to reinforce since wife gets easily mixed-up]     Assessment: Patient with multiple chronic diseases, including COPD, CHF, HTN, and Alzheimer's Disease.   Wife states she is exhausted and overwhelmed with caring for patient, and she desires placement for him.  Plan: Wife will continue to work with [facility] SW on placement options.           Wife will get re-fill for Benicar as soon as possible.           Wife will continue to implement COPD Action Plan for management of patient's symptoms.           RN Health Coach will collaborate with SW re placement issues.           RN Health Coach will follow-up in approximately 2 weeks.  Candie Mile, RN, MSN [place] [phone] Fax [phone]  Note:  In Basket message sent to social workers [name] and [place] re placement issues.

## 2015-03-23 ENCOUNTER — Other Ambulatory Visit: Payer: Self-pay | Admitting: *Deleted

## 2015-03-23 ENCOUNTER — Encounter: Payer: Self-pay | Admitting: *Deleted
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
## 2015-04-05 ENCOUNTER — Other Ambulatory Visit: Payer: Self-pay

## 2015-04-05 NOTE — Patient Outreach (Signed)
[facility]) Care Management  04/05/2015  [name] 08-07-1934 [id number]  Telephone contact with wife.  She reports patient's COPD is unchanged with no acute exacerbations.  She states she does have the COPD Action Plan on her refrigerator.  She was able to discuss 'Yellow Zone' symptoms and reported she would contact PCP if patient was showing signs of a flare up.  Wife continues to  report her concerns about being able to care for the patient at home.  She states that the patient wanders at night, and that he is incontinent of bladder and bowel. She did consult with [facility] SW about placement options. She does not qualify for covered placement at this time.  The patient does have a personal care aide for 2 hours a day five days a week, and he [facility] hours a week, which provides some assistance and relief for the wife.    COPD Action Plan education was reinforced. Support was provided to patient's caregiver (wife).  RN Health Coach will follow up in approximately one month.  Candie Mile, RN, MSN [place] [phone] Fax [phone]

## 2015-04-15 ENCOUNTER — Telehealth: Payer: Self-pay | Admitting: Internal Medicine

## 2015-04-15 NOTE — Telephone Encounter (Signed)
Spoke with pt's wife, states TP had spoken with a pharmaceutical company to help pt get symbicort-states they sent in symbicort through [place].  Pt states she is unsure if she needs to call us or call them for refills.  Per [employer] application form, pt is to call [phone] for refills.  Pt expressed understanding.  Nothing further needed.

## 2015-04-22 ENCOUNTER — Telehealth: Payer: Self-pay | Admitting: Adult Health

## 2015-04-22 NOTE — Telephone Encounter (Signed)
Called spoke with pt spouse. She is aware no samples at this time. Nothing further needed

## 2015-05-05 ENCOUNTER — Ambulatory Visit: Payer: Self-pay

## 2015-05-05 ENCOUNTER — Other Ambulatory Visit: Payer: Self-pay

## 2015-05-05 NOTE — Patient Outreach (Signed)
[facility]) Care Management  05/05/2015  [name] [birth year] [id number]   Scheduled contact for today was cancelled by the patient due to a family emergency. Appointment rescheduled for 05-11-15.  Candie Mile, RN, MSN [place] [phone] Fax [phone]

## 2015-05-09 ENCOUNTER — Encounter: Payer: Self-pay | Admitting: Cardiology

## 2015-05-11 ENCOUNTER — Other Ambulatory Visit: Payer: Self-pay

## 2015-05-11 DIAGNOSIS — J449 Chronic obstructive pulmonary disease, unspecified: Secondary | ICD-10-CM

## 2015-05-11 NOTE — Patient Outreach (Signed)
[place] [facility]) Care Management  05/11/2015  JAYLEND SCHIERER [birth year] [id number]  Attempted call to patient.  Scheduled appointment on 05-05-15 was cancelled by the wife, who stated there was a family emergency.  No answer this morning.  HIPPA appropriate message left requesting call back.  Candie Mile, RN, MSN [place] [phone] Fax [phone]

## 2015-05-11 NOTE — Patient Outreach (Signed)
[place] [facility]) Care Management  [place]  05/11/2015   [name] 1934/08/17 [id number]  Contact with patient's wife today.  Due to patient's dementia, the wife speaks for him.  She reports that patient's breathing is "OK", with no recent flare ups or new problems.  She reports patient continues to wander in the house during the night, so neither one of them gets enough sleep.  The wife expresses signs of caregiver strain.  She would like to have the patient placed, but cannot afford what it would cost out of pocket.  The patient does have a personal care aide for 2 hours a day, and he goes to [facility] 16 hours a week.  Current Medications:  Current Outpatient Prescriptions  Medication Sig Dispense Refill  . Aclidinium Bromide (TUDORZA PRESSAIR) 400 MCG/ACT AEPB Inhale 1 puff into the lungs 2 (two) times daily. One twice daily (Patient not taking: Reported on 01/13/2015)    . albuterol (PROVENTIL HFA;VENTOLIN HFA) 108 (90 BASE) MCG/ACT inhaler Inhale 2 puffs into the lungs every 6 (six) hours as needed for shortness of breath. 1 Inhaler 5  . albuterol (PROVENTIL) (5 MG/ML) 0.5% nebulizer solution Take 2.5 mg by nebulization every 6 (six) hours as needed for shortness of breath.    Marland Kitchen arformoterol (BROVANA) 15 MCG/2ML NEBU Take 2 mLs (15 mcg total) by nebulization 2 (two) times daily. (Patient not taking: Reported on 12/29/2014) 120 mL 1  . aspirin 81 MG tablet Take 81 mg by mouth every morning.     . budesonide (PULMICORT) 0.25 MG/2ML nebulizer solution Take 2 mLs (0.25 mg total) by nebulization 2 (two) times daily. 120 mL 2  . budesonide-formoterol (SYMBICORT) 160-4.5 MCG/ACT inhaler Inhale 2 puffs into the lungs 2 (two) times daily. 3 Inhaler 3  . Cholecalciferol (VITAMIN D) 2000 UNITS CAPS Take 2,000 Units by mouth every morning. Take one tablet twice daily    . dextromethorphan-guaiFENesin ([place] DM) 30-600 MG 12hr tablet Reported on 03/17/2015    . digoxin  (LANOXIN) 0.125 MG tablet Take 0.125 mg by mouth every morning.     . furosemide (LASIX) 20 MG tablet Take 1 tablet (20 mg total) by mouth daily. (Patient taking differently: Take 20 mg by mouth every morning. ) 5 tablet 0  . lisinopril (PRINIVIL,ZESTRIL) 20 MG tablet Take 20 mg by mouth daily. Reported on 03/17/2015    . metoprolol tartrate (LOPRESSOR) 25 MG tablet Take 0.5 tablets (12.5 mg total) by mouth 2 (two) times daily. 30 tablet 0  . Multiple Vitamins-Minerals (CENTRUM SILVER PO) Take by mouth daily.    Marland Kitchen olmesartan-hydrochlorothiazide (BENICAR HCT) 20-12.5 MG tablet Take 1 tablet by mouth daily. Reported on 03/22/2015    . pravastatin (PRAVACHOL) 40 MG tablet Take 40 mg by mouth at bedtime.     . traZODone (DESYREL) 50 MG tablet Take 50 mg by mouth at bedtime as needed for sleep.    . valsartan (DIOVAN) 160 MG tablet Take 160 mg by mouth every morning. Reported on 03/22/2015     No current facility-administered medications for this visit.   [facility] CM Care Plan Problem One        Most Recent Value   Care Plan Problem One  Level of Care Issues - patient's wife is no longer able to adequately care for patient at home, requesting long-term care placement arrangements.   Role Documenting the Problem One  Clinical Social Worker   Care Plan for Problem One  Not Active  THN Long Term Goal (31-90 days)  Patient will have secure placement arrangements into a long-term care facility, within the next 45 days.   THN Long Term Goal Start Date  01/20/15   THN Long Term Goal Met Date  03/23/15   Interventions for Problem One Long Term Goal  Wife reports she cannot afford LTC for patient.   [facility] CM Short Term Goal #2 (0-30 days)  Patient's wife, with the assistance of CSW, will contact patient's primary care physician to request completion of an FL-2 form for placement purposes.   THN CM Short Term Goal #2 Start Date  01/20/15   [facility] CM Short Term Goal #2 Met Date  01/26/15   Interventions for Short  Term Goal #2  CSW will make arrangements to obtain patient's completed and signed FL-2 form from patient's primary care physician and fax to all facilities of interest.   [facility]N CM Short Term Goal #3 (0-30 days)  patient's wife will identify at least 3 signs and symptoms of COPD flare-up and when to call the doctor in the next 30 days   THN CM Short Term Goal #3 Start Date  12/22/14   [facility] CM Short Term Goal #3 Met Date  05/11/15   Interventions for Short Tern Goal #3  Wife demonstrates knowledge and understanding of COPD Action Plan.       Assessment: COPD managed by wife using COPD Action Plan.  Plan: Wife will continue using COPD Action Plan to manage the patient's lung disease.           Wife will call the [facility] SW to request an assessment.           RN will follow up in approximately one month.  Candie Mile, RN, MSN [place] [phone] Fax [phone]

## 2015-06-08 ENCOUNTER — Other Ambulatory Visit: Payer: Self-pay

## 2015-06-08 NOTE — Patient Outreach (Signed)
[place] [facility]) Care Management  06/08/2015  [name] 1934/03/28 [id number]   Unsuccessful attempt to reach patient for scheduled assessment.  HIPPA appropriate message left requesting call back.  If no response, RN will make another attempt within 5 working days.  Candie Mile, RN, MSN [place] [phone] Fax [phone]

## 2015-06-08 NOTE — Patient Outreach (Signed)
[place] [facility]) Care Management  06/08/2015  RIGLEY FALGOUT 1934-10-03 [id number]   Received a call back from Mrs. Seymour past leaving a message earlier today.  Mrs. Wildermuth reports that Mr. Graig COPD is well managed at this time.  She is aware of COPD Action Plan, and states she would notify MD for "Yellow Zone" symptoms.  She also reports that the patient's dementia is her primary concern.  She would like to obtain placement for him, but does not see that as an option for now.  She was encouraged to call [facility] for a Nurse Navigator evaluation- she states she has the contact information she was given previously, but she has not yet called.  Plan: Will close Health Coach services for COPD education.  She was encouraged to call back if there is anything I can assist her with.  Candie Mile, RN, MSN [place] [phone] Fax [phone]

## 2015-06-24 ENCOUNTER — Encounter: Payer: Self-pay | Admitting: Adult Health

## 2015-06-24 ENCOUNTER — Ambulatory Visit (INDEPENDENT_AMBULATORY_CARE_PROVIDER_SITE_OTHER): Payer: Commercial Managed Care - HMO | Admitting: Adult Health

## 2015-06-24 ENCOUNTER — Telehealth: Payer: Self-pay | Admitting: Adult Health

## 2015-06-24 VITALS — BP 138/78 | HR 60 | Temp 97.5°F | Ht 72.0 in | Wt 176.0 lb

## 2015-06-24 DIAGNOSIS — J449 Chronic obstructive pulmonary disease, unspecified: Secondary | ICD-10-CM

## 2015-06-24 NOTE — Telephone Encounter (Signed)
[name] returned our call. I verified the address in epic was correct and that it was okay to mail the forms to the patient's address. She voiced understanding and had no further questions. Forms have be placed at the front to be mailed out. Will await return of forms.

## 2015-06-24 NOTE — Progress Notes (Signed)
Chart and office note reviewed in detail  > agree with a/p as outlined    

## 2015-06-24 NOTE — Assessment & Plan Note (Signed)
compenstaed on Symbicort  Assistance paperwork given to wife  Plan  Continue on Symbicort Twice daily  , rinse after use.  Follow up in office in 6 months and As needed

## 2015-06-24 NOTE — Progress Notes (Signed)
Subjective:     Patient ID: [name], male   DOB: [birth year]  MRN: [id number]     Brief patient profile:  81  yobm s/p smoking cessation in 1995 previously felt to have COPD  GOLD II criteria with reversibility 01/06/09    HPI January 06, 2009 Followup with PFT's. Pt states that overall breathing has been okay. He gets SOB when he gets in a rush. [name] states that pt gets out of breath just walking from one room to the next. Also he c/o dry cough- especially at night x 4-6 weeks eval by [name] with cxr and rx cough syrup > no better. no purulent sputum. Try higher strength of Symbicort 160 2 puffs first thing in am and 2 puffs again in pm about 12 hours later and if happy with it fill the prescription.  as long as you are coughing take the zegerid otc at bedtime > seemed to help to pt and wife's satisfaction.      09/01/2013 Acute OV  Complains of increased SOB especially w/ exertion, wheezing, some dry cough x2-3weeks. No fever or discolored mucus  Occurred while at [facility]. No leg swelling, calf pain or edema.  At rest no significant dyspnea. But cant walk 10 feet without severe dyspnea, cough and wheezing.  Today in office with desats in 70% with ambulation.  EKG shows ST depression .  O2 sats at rest 100% on RA  No chest pain, n/v/d, hemoptysis , fever.  Denies f/c/s, hemoptysis, nausea, vomiting. No calf pain rec admit  Admit date: 09/01/2013  Discharge date: 09/07/2013  Recommendations for Outpatient Follow-up:  1. Acute hypoxic respiratory failure, multifactorial, patient newly started on home oxygen 2 L per minute continuous 2. Symptomatic microcytic anemia, status post iron infusion, consider repeat CBC in the next few weeks to assess response to oral iron therapy. 3. Evidence of worsened systolic congestive heart failure currently compensated. Currently euvolemic without diuretics. 4. Resolution of COPD exacerbation and possible community acquired  pneumonia. 5. Metformin has been discontinued secondary to his heart failure, blood sugars have been well controlled during this hospitalization without significant use of insulin or other agents. For now suggest continue monitor blood sugars and hold medication  6.  Discharge Diagnoses:  1. Acute hypoxic respiratory failure 2. Symptomatic microcytic anemia secondary to Candida esophagitis 3. Possible acute on chronic systolic congestive heart failure 4. Candida esophagitis 5. COPD, possible exacerbation 6. Possible [facility] pneumonia 7. Diabetes mellitus type 2 currently diet controlled Discharge Condition: Improved  Disposition: Home with home health RN, PT  Diet recommendation: Heart healthy diabetic diet  Filed Weights    09/05/13 0400  09/06/13 0410  09/07/13 0403   Weight:  74.707 kg (164 lb 11.2 oz)  74.2 kg (163 lb 9.3 oz)  72.7 kg (160 lb 4.4 oz)   History of present illness:  81 year old man presented with progressive dyspnea on exertion for 3 weeks, asymptomatic at rest, hypoxic with ambulation his pulmonologist office. He was sent to the emergency department for further evaluation of hypoxia. Initial evaluation revealed acute respiratory failure with hypoxia, mild CHF , possible exertional angina.  Hospital Course:  [name] was admitted for further evaluation of respiratory failure with hypoxia, abnormal EKG and possible COPD. He was seen in consultation with cardiology, cardiac enzymes were negative and there were no signs or symptoms to suggest acute coronary syndrome. 2-D echocardiogram did reveal worsening of chronic systolic heart failure, cardiology recommended medical management with no  further intervention. His respiratory status has remained stable without evidence of volume overload without diuretic therapy. He is to have microcytic anemia and seen in consultation with gastroenterology, underwent EGD which revealed Candida esophagitis, see full report below. He is  also felt to have a COPD exacerbation and possible pneumonia. With treatment of these issues his condition is improved at the time of discharge she is quite stable but will her car home oxygen. Individual issues as below. Beta blocker was recommended for his heart disease, he appears to be tolerating this well from a pulmonary standpoint.  1. Acute hypoxic respiratory failure. No hypoxia except with ambulation. Multifactorial, favor progression of COPD, possible pneumonia, anemia, systolic heart failure. 2. Symptomatic anemia. EGD revealed Candida esophagitis, gastric ulceration, moderate erosive esophagitis. Anemia felt to be multifactorial but esophagitis likely contributing. Stable. 3. Suspect subacute systolic congestive heart failure on admission. Echocardiogram with significant decrease in left ventricular ejection fraction. Suspect several week history of dyspnea on exertion related to decreased heart function combined with anemia and COPD. Discussed in detail with the patient's cardiologist recommended starting beta blocker, digoxin 4. Candida esophagitis. 5. Elevated troponin on admission. Subsequent troponins negative. No evidence of ACS on admission. 6. COPD possible exacerbation. Appears resolved. 7. Possible Community acquired pneumonia. Appears resolved. Finish Levaquin. 8. Diabetes mellitus type 2. Well controlled. 9. Alzheimer's dementia. Stable, at baseline 10. Tobacco dependence. Recommend cessation. Plan discharge home today with home health RN, PT.  Complete steroid taper, antibiotics  Iron on discharge, will need 7 days of Nystatin. PPI BID for 3 months and then QD. Followup with GI as needed. Consultants:  Cardiology Procedures:  Transfusion one unit packed red blood cells  EGD ENDOSCOPIC IMPRESSION: 1. Candida Esophagitis  2. Moderate Erosive Esophagitis  3. Mild Antral Gastritis  4. Gastric Ulceration  5. Small Hiatal Hernia  6. Minimal Duodenitis  7. Anemia likely  multifactorial but esophagitis likely contributing  to it  Antibiotics:  Levaquin 6/10 >> 6/17 Discharge Instructions  Discharge Instructions    Diet - low sodium heart healthy  Complete by: As directed       Diet Carb Modified  Complete by: As directed       Discharge instructions  Complete by: As directed    Call physician or seek immediate medical attention for increased shortness of breath, chest pain, bleeding or worsening in condition. Continue to check blood sugars every day. Metformin has been discontinued because of your heart weakness. However your blood sugars have been well controlled during her hospital stay. Call your physician for blood sugars greater than 400 or less than 70.     Increase activity slowly  Complete by: As directed              Medication List     STOP taking these medications       clopidogrel 75 MG tablet    Commonly known as: PLAVIX    metFORMIN 500 MG tablet    Commonly known as: GLUCOPHAGE     TAKE these medications       albuterol (2.5 MG/3ML) 0.083% nebulizer solution    Commonly known as: PROVENTIL    Take 2.5 mg by nebulization every 6 (six) hours as needed for wheezing or shortness of breath.    aspirin 81 MG EC tablet    Take 1 tablet (81 mg total) by mouth daily.    budesonide-formoterol 160-4.5 MCG/ACT inhaler    Commonly known as: SYMBICORT    Inhale 2 puffs  into the lungs 2 (two) times daily.    digoxin 0.125 MG tablet    Commonly known as: LANOXIN    Take 1 tablet (0.125 mg total) by mouth daily.    ferrous sulfate 325 (65 FE) MG EC tablet    Take 1 tablet (325 mg total) by mouth daily with breakfast.    levofloxacin 500 MG tablet    Commonly known as: LEVAQUIN    Take 1 tablet (500 mg total) by mouth daily. Start 6/16 in the morning.    metoprolol tartrate 25 MG tablet    Commonly known as: LOPRESSOR    Take 0.5 tablets (12.5 mg total) by mouth 2 (two) times daily.    nystatin 100000 UNIT/ML suspension    Commonly known  as: MYCOSTATIN    Take 5 mLs (500,000 Units total) by mouth 4 (four) times daily.    olmesartan 40 MG tablet    Commonly known as: BENICAR    Take 40 mg by mouth daily.    pantoprazole 40 MG tablet    Commonly known as: PROTONIX    Take 1 tablet (40 mg total) by mouth 2 (two) times daily.    predniSONE 10 MG tablet    Commonly known as: DELTASONE    Start 6/16 in AM. Take 20 mg by mouth daily for 3 days. Then take 10 mg by mouth daily for 3 days. Then stop.    rosuvastatin 20 MG tablet    Commonly known as: CRESTOR    Take 20 mg by mouth at bedtime.    Vitamin D 2000 UNITS Caps    Take 1 capsule by mouth at bedtime.      09/23/2013 post f/u ov/extensive ov at transition of care/Wert re: 02 24/7 at 2lpm Chief Complaint  Patient presents with  . HFU    Pt states breathing is some better, but not back to baseline. He c/o increased cough that started before he was d/c'ed- cough is non prod.   no worse since finished prednisone  Now on 02 continuously  @ 2lpm  Cough is mostly daytime, not assoc with meals Confused with meds rec Work on inhaler technique:     Only use your albuterol  as a rescue medication     06/04/2014 f/u ov/Wert re: COPD II    Chief Complaint  Patient presents with  . Acute Visit    Pt c/o increased SOB for the past 2 days.  He is SOB with exertion and spouse states he is gasping for air in his sleep.  He also c/o cough more than usual- occ prod with minimal clear sputum.    maint rx = symbicort 160 2bid   Has nebulizer but rarely uses >>pred taper and start on tudorza   12/22/2014 Follow up : Devinn Voshell /COPD GOLD II  Pt returns for 6 month follow up .  He is accompanied by his wife.  Pt has moderate to severe dementia.  Wife answers most of his questions , she is his caregiver.  We reviewed his meds and organized them into a med calendar . Wife says he has hit the doughnut hole and it is hard to afford his inhalers. We discussed changing to Nebs as it is also  hard to coordinate MDI use .  He is taking lisinopril and divoan, it appears to have been prescribed by two different providers. Suggested he stop ACE Inhibitor. Continue on Diovan and discuss regimen with PCP .  Overall says his breathing  has been good with no flare of cough or dyspnea. No ER /hosp visits since last ov .  No fever, chest pain, orthopnea or edema.   She was somewhat upset because she has a lot of responsibiltiy with his care and they missed an appointment here because he took the phone call for the ov and forgot to tell her. .  Support provided. Finances are also tough.   Needs flu shot today . Prevnar and Pneumovax utd.   According to review of hospital records he was admitted in 11/2013 with PNA and DVT . Tx w/ Xarelto ? For 6 months as he is no longer taking.   06/24/2015 Follow up : COPD GOLD II  Pt returns for 6 months follow up .  Pt states his breathing and BP is doing well and has no new complaints at this time.  Doing great for last 6 months, no flare of cough or wheezing.  Remains on Symbicort . Assistance through the company . Needs to reapply for assistance .   Lisinopril stopped last ov , cough and runny nose are much better.  Wife is pt cargiver as he has moderate to advanced dementia.  Denies chest pain, fever or hemoptysis .  PVX and prevnar utd.   Current Medications, Allergies, Complete Past Medical History, Past Surgical History, Family History, and Social History were reviewed in [employer] record.  ROS  The following are not active complaints unless bolded sore throat, dysphagia, dental problems, itching, sneezing,  nasal congestion or excess/ purulent secretions, ear ache,   fever, chills, sweats, unintended wt loss, pleuritic or exertional cp, hemoptysis,  orthopnea pnd or leg swelling, presyncope, palpitations, heartburn, abdominal pain, anorexia, nausea, vomiting, diarrhea  or change in bowel or urinary habits, change in stools  or urine, dysuria,hematuria,  rash, arthralgias, visual complaints, headache, numbness weakness or ataxia or problems with walking or coordination,  change in mood/affect or memory.                  Past Medical History:  HYPERLIPIDEMIA (ICD-272.4)  PVD (ICD-443.9)  HYPERTENSION NEC  DYSPNEA (ICD-786.05)  COPD  PFT's 11/18/00 FEV1 is 1.94 or 60%  - PFT's 01/06/09 FEV1 1.75 (57%) ratio 49 and 21 % better FVC after B2 DLC0 71  - HFA 90% better p coaching 01/23/10  -med calendar 12/22/2014             Objective:   Physical Exam   elderly black male ambulatory   Filed Vitals:   06/24/15 0948  BP: 138/78  Pulse: 60  Temp: 97.5 F (36.4 C)  TempSrc: Oral  Height: 6' (1.829 m)  Weight: 176 lb (79.833 kg)  SpO2: 98%    Vitals signs reviewed   Wt  187 January 06, 2009 > 186 01/23/10 > 05/04/2011  191 > 06/16/2013  170 >169 09/01/2013 >   169 09/23/2013 > 06/04/2014  181 >171 12/22/2014   HEENT mild turbinate edema. Edentulous,  Oropharynx no thrush or excess pnd or cobblestoning. No JVD or cervical adenopathy. Mild accessory muscle hypertrophy. Trachea midline, nl thryroid. Chest was hyperinflated by percussion with diminished breath sounds   Regular rate and rhythm without murmur gallop or rub or increase P2. No edema , neg homans sign Abd: no hsm, nl excursion. Ext warm without cyanosis or clubbing. Pleasant but  confused      . CXR PA and Lateral:   06/04/2014 :       Interim near complete clearing  of left lower lobe infiltrate .     Labs ordered/ reviewed    Lab 06/04/14 1004  NA 139  K 4.0  CL 103  CO2 31  BUN 12  CREATININE 1.14  GLUCOSE 100*    Recent Labs Lab 06/04/14 1004  HGB 14.0  HCT 42.7  WBC 7.9  PLT 239.0     Lab Results  Component Value Date   TSH 0.94 06/04/2014     Lab Results  Component Value Date   PROBNP 160.0* 06/04/2014           Assessment:

## 2015-06-24 NOTE — Patient Instructions (Signed)
Continue on Symbicort Twice daily  , rinse after use.  Follow up in office in 6 months and As needed

## 2015-06-24 NOTE — Telephone Encounter (Signed)
Pt was seen today with TP. Pt was suppose to be given patient assistance forms for symbicort but left before forms can be given. Attempted to call pt to inform him and his wife that I can mail the forms to his address. LVM for pt to return call Will need to verify address

## 2015-06-28 ENCOUNTER — Encounter: Payer: Self-pay | Admitting: Family

## 2015-07-05 ENCOUNTER — Ambulatory Visit (INDEPENDENT_AMBULATORY_CARE_PROVIDER_SITE_OTHER)
Admission: RE | Admit: 2015-07-05 | Discharge: 2015-07-05 | Disposition: A | Discharge status: Home / Self Care | Payer: Commercial Managed Care - HMO | Source: Ambulatory Visit | Attending: Family | Admitting: Family

## 2015-07-05 ENCOUNTER — Encounter: Payer: Self-pay | Admitting: Family

## 2015-07-05 ENCOUNTER — Ambulatory Visit (HOSPITAL_COMMUNITY)
Admission: RE | Admit: 2015-07-05 | Discharge: 2015-07-05 | Disposition: A | Discharge status: Home / Self Care | Payer: Commercial Managed Care - HMO | Source: Ambulatory Visit | Attending: Family | Admitting: Family

## 2015-07-05 ENCOUNTER — Ambulatory Visit (INDEPENDENT_AMBULATORY_CARE_PROVIDER_SITE_OTHER): Payer: Commercial Managed Care - HMO | Admitting: Family

## 2015-07-05 VITALS — BP 150/75 | HR 64 | Temp 97.6°F | Resp 18 | Ht 72.0 in | Wt 174.0 lb

## 2015-07-05 DIAGNOSIS — R0989 Other specified symptoms and signs involving the circulatory and respiratory systems: Secondary | ICD-10-CM | POA: Diagnosis not present

## 2015-07-05 DIAGNOSIS — I110 Hypertensive heart disease with heart failure: Secondary | ICD-10-CM | POA: Insufficient documentation

## 2015-07-05 DIAGNOSIS — E119 Type 2 diabetes mellitus without complications: Secondary | ICD-10-CM | POA: Insufficient documentation

## 2015-07-05 DIAGNOSIS — Z48812 Encounter for surgical aftercare following surgery on the circulatory system: Secondary | ICD-10-CM

## 2015-07-05 DIAGNOSIS — I739 Peripheral vascular disease, unspecified: Secondary | ICD-10-CM

## 2015-07-05 DIAGNOSIS — Z87891 Personal history of nicotine dependence: Secondary | ICD-10-CM | POA: Diagnosis not present

## 2015-07-05 DIAGNOSIS — Z86718 Personal history of other venous thrombosis and embolism: Secondary | ICD-10-CM

## 2015-07-05 DIAGNOSIS — E785 Hyperlipidemia, unspecified: Secondary | ICD-10-CM | POA: Insufficient documentation

## 2015-07-05 DIAGNOSIS — Z95828 Presence of other vascular implants and grafts: Secondary | ICD-10-CM

## 2015-07-05 DIAGNOSIS — I509 Heart failure, unspecified: Secondary | ICD-10-CM | POA: Diagnosis not present

## 2015-07-05 DIAGNOSIS — R938 Abnormal findings on diagnostic imaging of other specified body structures: Secondary | ICD-10-CM | POA: Insufficient documentation

## 2015-07-05 NOTE — Patient Instructions (Signed)
Peripheral Vascular Disease Peripheral vascular disease (PVD) is a disease of the blood vessels that are not part of your heart and brain. A simple term for PVD is poor circulation. In most cases, PVD narrows the blood vessels that carry blood from your heart to the rest of your body. This can result in a decreased supply of blood to your arms, legs, and internal organs, like your stomach or kidneys. However, it most often affects a person's lower legs and feet. There are two types of PVD.  Organic PVD. This is the more common type. It is caused by damage to the structure of blood vessels.  Functional PVD. This is caused by conditions that make blood vessels contract and tighten (spasm). Without treatment, PVD tends to get worse over time. PVD can also lead to acute ischemic limb. This is when an arm or limb suddenly has trouble getting enough blood. This is a medical emergency. CAUSES Each type of PVD has many different causes. The most common cause of PVD is buildup of a fatty material (plaque) inside of your arteries (atherosclerosis). Small amounts of plaque can break off from the walls of the blood vessels and become lodged in a smaller artery. This blocks blood flow and can cause acute ischemic limb. Other common causes of PVD include:  Blood clots that form inside of blood vessels.  Injuries to blood vessels.  Diseases that cause inflammation of blood vessels or cause blood vessel spasms.  Health behaviors and health history that increase your risk of developing PVD. RISK FACTORS  You may have a greater risk of PVD if you:  Have a family history of PVD.  Have certain medical conditions, including:  High cholesterol.  Diabetes.  High blood pressure (hypertension).  Coronary heart disease.  Past problems with blood clots.  Past injury, such as burns or a broken bone. These may have damaged blood vessels in your limbs.  Buerger disease. This is caused by inflamed blood  vessels in your hands and feet.  Some forms of arthritis.  Rare birth defects that affect the arteries in your legs.  Use tobacco.  Do not get enough exercise.  Are obese.  Are age 50 or older. SIGNS AND SYMPTOMS  PVD may cause many different symptoms. Your symptoms depend on what part of your body is not getting enough blood. Some common signs and symptoms include:  Cramps in your lower legs. This may be a symptom of poor leg circulation (claudication).  Pain and weakness in your legs while you are physically active that goes away when you rest (intermittent claudication).  Leg pain when at rest.  Leg numbness, tingling, or weakness.  Coldness in a leg or foot, especially when compared with the other leg.  Skin or hair changes. These can include:  Hair loss.  Shiny skin.  Pale or bluish skin.  Thick toenails.  Inability to get or maintain an erection (erectile dysfunction). People with PVD are more prone to developing ulcers and sores on their toes, feet, or legs. These may take longer than normal to heal. DIAGNOSIS Your health care provider may diagnose PVD from your signs and symptoms. The health care provider will also do a physical exam. You may have tests to find out what is causing your PVD and determine its severity. Tests may include:  Blood pressure recordings from your arms and legs and measurements of the strength of your pulses (pulse volume recordings).  Imaging studies using sound waves to take pictures of   the blood flow through your blood vessels (Doppler ultrasound).  Injecting a dye into your blood vessels before having imaging studies using:  X-rays (angiogram or arteriogram).  Computer-generated X-rays (CT angiogram).  A powerful electromagnetic field and a computer (magnetic resonance angiogram or MRA). TREATMENT Treatment for PVD depends on the cause of your condition and the severity of your symptoms. It also depends on your age. Underlying  causes need to be treated and controlled. These include long-lasting (chronic) conditions, such as diabetes, high cholesterol, and high blood pressure. You may need to first try making lifestyle changes and taking medicines. Surgery may be needed if these do not work. Lifestyle changes may include:  Quitting smoking.  Exercising regularly.  Following a low-fat, low-cholesterol diet. Medicines may include:  Blood thinners to prevent blood clots.  Medicines to improve blood flow.  Medicines to improve your blood cholesterol levels. Surgical procedures may include:  A procedure that uses an inflated balloon to open a blocked artery and improve blood flow (angioplasty).  A procedure to put in a tube (stent) to keep a blocked artery open (stent implant).  Surgery to reroute blood flow around a blocked artery (peripheral bypass surgery).  Surgery to remove dead tissue from an infected wound on the affected limb.  Amputation. This is surgical removal of the affected limb. This may be necessary in cases of acute ischemic limb that are not improved through medical or surgical treatments. HOME CARE INSTRUCTIONS  Take medicines only as directed by your health care provider.  Do not use any tobacco products, including cigarettes, chewing tobacco, or electronic cigarettes. If you need help quitting, ask your health care provider.  Lose weight if you are overweight, and maintain a healthy weight as directed by your health care provider.  Eat a diet that is low in fat and cholesterol. If you need help, ask your health care provider.  Exercise regularly. Ask your health care provider to suggest some good activities for you.  Use compression stockings or other mechanical devices as directed by your health care provider.  Take good care of your feet.  Wear comfortable shoes that fit well.  Check your feet often for any cuts or sores. SEEK MEDICAL CARE IF:  You have cramps in your legs  while walking.  You have leg pain when you are at rest.  You have coldness in a leg or foot.  Your skin changes.  You have erectile dysfunction.  You have cuts or sores on your feet that are not healing. SEEK IMMEDIATE MEDICAL CARE IF:  Your arm or leg turns cold and blue.  Your arms or legs become red, warm, swollen, painful, or numb.  You have chest pain or trouble breathing.  You suddenly have weakness in your face, arm, or leg.  You become very confused or lose the ability to speak.  You suddenly have a very bad headache or lose your vision.   This information is not intended to replace advice given to you by your health care provider. Make sure you discuss any questions you have with your health care provider.   Document Released: 04/19/2004 Document Revised: 04/02/2014 Document Reviewed: 08/20/2013 Elsevier Interactive Patient Education 2016 Elsevier Inc.  

## 2015-07-05 NOTE — Progress Notes (Signed)
[facility] [place] HISTORY AND PHYSICAL -PAD  History of Present Illness [name] is a 81 y.o. male patient of Dr. Kellie Simmering who is s/p right femoral-popliteal bypass grafting with Gore-Tex in February 2015 and his ulcerated toe successfully healed. He has a right to left femoral-femoral bypass and an occluded left femoral-popliteal above-knee Gore-Tex graft. He had a right common iliac artery stent placed on 04/20/13.  Pt last saw Dr. Kellie Simmering on 05/18/14. At that time Dr. Kellie Simmering did not think patient was having rest pain in the left foot to the degree that he would require attempt at a revascularization which has low chance of success. He would need below-knee femoral-popliteal bypass grafting with Gore-Tex and he has a failed graft on the left side. He currently has no nonhealing ulcers or limb threatening ischemia. Ulcerations in the right foot have healed. Dr. Kellie Simmering discussed this at length with patient's wife and she was in agreement. Dr. Kellie Simmering stressed the importance of avoiding pressure sores or ulcerations. Dr. Evelena Leyden 05/18/14 assessment note indicates that If the patient does develop a pressure sore or nonhealing ulcer in the left foot he is a candidate for attempt at femoral-popliteal grafting on the left.  The patient returns today for 6 months follow up of his peripheral artery occlusive disease.  Wife states that pt's shortness of breath limits his walking, he does not seem to elicit claudication symptoms, but wife states she is walking with him daily to get him to walk, she states he uses his walker to sit when he gets tired.   Wife states pt has Alzheimer's Disease which has worsened.   Dr. Einar Gip is pt's cardiologist, wife states pt has CHF, denies any known history of MI.  Wife also denies any known history of pt having a stroke or TIA.  Pt Diabetic: "prediabetic" Pt smoker: former smoker, quit in the 1990's  Pt meds include: Statin  :Yes Betablocker: Yes ASA: Yes Other anticoagulants/antiplatelets: no      Past Medical History  Diagnosis Date  . Hyperlipidemia   . PVD (peripheral vascular disease) ([place])   . Hypertension   . COPD (chronic obstructive pulmonary disease) ([place])   . Colon cancer ([place])   . Alzheimer's dementia 2013  . Pneumonia 2014  . Dyspnea   . Type II diabetes mellitus ([place])   . Depression   . CHF (congestive heart failure) ([place])   . Anemia     HX OF ANEMIA    Social History Social History  Substance Use Topics  . Smoking status: Former Smoker -- 0.30 packs/day for 40 years    Types: Cigarettes    Quit date: 03/26/1993  . Smokeless tobacco: Never Used  . Alcohol Use: No    Family History Family History  Problem Relation Age of Onset  . Stroke Mother 62  . Hypertension Mother   . Cancer Sister   . Heart disease Sister   . Heart disease Brother     Heart Disease before age 2  . Heart attack Brother   . Cancer Sister   . Cancer Brother     Past Surgical History  Procedure Laterality Date  . Colon surgery  1996    cancer  . Lumbar disc surgery  2005  . Vascular surgery    . Colon resection  1997    /enc. notes 05/17/2004 (04/28/2013)  . Femoral artery - femoral artery bypass graft      right to left/enc. notes 05/17/2004 (04/28/2013)  .  Femoral bypass Right     /enc. notes 05/17/2004 (04/28/2013)  . Fiberoptic bronchoscopy      /enc. notes 03/16/2005 (04/28/2013)  . Cataract extraction w/ intraocular lens implant Left 01/2013  . Cataract extraction Right 02/2013  . Femoral-popliteal bypass graft Right 04/29/2013    Procedure:  FEMORAL-POPLITEAL ARTERY Bypass Graft with intraoperative ultrasound and arteriogarm;  Surgeon: Mal Misty, MD;  Location: [facility];  Service: Vascular;  Laterality: Right;  . Intraoperative arteriogram Right 04/29/2013    Procedure: INTRA OPERATIVE ARTERIOGRAM;  Surgeon: Mal Misty, MD;  Location: [facility];  Service: Vascular;  Laterality: Right;  . Spine  surgery    . Esophagogastroduodenoscopy N/A 09/05/2013    Procedure: ESOPHAGOGASTRODUODENOSCOPY (EGD);  Surgeon: Lear Ng, MD;  Location[facility] ENDOSCOPY;  Service: Endoscopy;  Laterality: N/A;  . Abdominal angiogram N/A 04/20/2013    Procedure: ABDOMINAL ANGIOGRAM;  Surgeon: Angelia Mould, MD;  Location[name] CATH LAB;  Service: Cardiovascular;  Laterality: N/A;  . Lower extremity angiogram  04/20/2013    Procedure: LOWER EXTREMITY ANGIOGRAM;  Surgeon: Angelia Mould, MD;  Location: [facility] CATH LAB;  Service: Cardiovascular;;  . Percutaneous stent intervention  04/20/2013    Procedure: PERCUTANEOUS STENT INTERVENTION;  Surgeon: Angelia Mould, MD;  Location: [facility] CATH LAB;  Service: Cardiovascular;;  right common iliac artery    No Known Allergies  Current Outpatient Prescriptions  Medication Sig Dispense Refill  . albuterol (PROVENTIL HFA;VENTOLIN HFA) 108 (90 BASE) MCG/ACT inhaler Inhale 2 puffs into the lungs every 6 (six) hours as needed for shortness of breath. 1 Inhaler 5  . albuterol (PROVENTIL) (5 MG/ML) 0.5% nebulizer solution Take 2.5 mg by nebulization every 6 (six) hours as needed for shortness of breath.    Marland Kitchen aspirin 81 MG tablet Take 81 mg by mouth every morning.     . budesonide-formoterol (SYMBICORT) 160-4.5 MCG/ACT inhaler Inhale 2 puffs into the lungs 2 (two) times daily. 3 Inhaler 3  . Cholecalciferol (VITAMIN D) 2000 UNITS CAPS Take 2,000 Units by mouth every morning. Take one tablet twice daily    . dextromethorphan-guaiFENesin (Unity DM) 30-600 MG 12hr tablet Reported on 03/17/2015    . digoxin (LANOXIN) 0.125 MG tablet Take 0.125 mg by mouth every morning.     . furosemide (LASIX) 20 MG tablet Take 1 tablet (20 mg total) by mouth daily. (Patient taking differently: Take 20 mg by mouth every morning. ) 5 tablet 0  . metoprolol tartrate (LOPRESSOR) 25 MG tablet Take 0.5 tablets (12.5 mg total) by mouth 2 (two) times daily. 30 tablet 0  . Multiple  Vitamins-Minerals (CENTRUM SILVER PO) Take by mouth daily.    . pravastatin (PRAVACHOL) 40 MG tablet Take 40 mg by mouth at bedtime.     . TRADJENTA 5 MG TABS tablet Take 1 tablet by mouth daily.    . traZODone (DESYREL) 50 MG tablet Take 50 mg by mouth at bedtime as needed for sleep.     No current facility-administered medications for this visit.    ROS: See HPI for pertinent positives and negatives.   Physical Examination  Filed Vitals:   07/05/15 1008 07/05/15 1014  BP: 159/76 150/75  Pulse: 64 64  Temp: 97.6 F (36.4 C)   TempSrc: Oral   Resp: 18   Height: 6' (1.829 m)   Weight: 174 lb (78.926 kg)   SpO2: 96%    Body mass index is 23.59 kg/(m^2).  General: A&O to person only, WDWN. Gait: normal Eyes:  PERRLA. Pulmonary: CTAB, without wheezes , rales or rhonchi. Cardiac: regular rhythm, no detected murmur.     Carotid Bruits Right Left   Negative Negative  Aorta is not palpable. Radial pulses: are palpable   VASCULAR EXAM: Extremities without ischemic change, without gangrene; without open wounds. Fem to fem bypass graft is palpable.     LE Pulses Right Left   FEMORAL faintly palpable faintly palpable    POPLITEAL not palpable  not palpable   POSTERIOR TIBIAL not palpable  not palpable    DORSALIS PEDIS  ANTERIOR TIBIAL not palpable  not palpable    Abdomen: soft, NT, no palpable masses. Skin: no rashes, no ulcers. Musculoskeletal: no muscle wasting or atrophy. Neurologic: A&O X 1; Appropriate Affect ; SENSATION: normal; MOTOR FUNCTION: moving all extremities equally, motor strength 5/5 throughout. Speech is fluent/normal.  CN 2-12 intact.           Non-Invasive Vascular Imaging: DATE: 07/05/2015 LOWER EXTREMITY ARTERIAL  DUPLEX EVALUATION    INDICATION: Follow up bypass graft     PREVIOUS INTERVENTION(S): Right to left femoral-femoral bypass graft in 2003; Right common iliac artery stent on 04/20/13; Right femoral to above knee popliteal artery bypass graft on 04/29/13; Left femoral-popliteal artery bypass graft with subsequent occlusion    DUPLEX EXAM:     RIGHT  LEFT   Peak Systolic Velocity (cm/s) Ratio (if abnormal) Waveform  Peak Systolic Velocity (cm/s) Ratio (if abnormal) Waveform  161  M Inflow Artery     65  B Proximal Anastomosis     46  M Proximal Graft     64  M Mid Graft     46  M  Distal Graft     49  M Distal Anastomosis     4  M Outflow Artery     0.72 Today's ABI / TBI 0.45  0.69 Previous ABI / TBI (12/29/2014 ) 0.53    Waveform:    M - Monophasic       B - Biphasic       T - Triphasic  If Ankle Brachial Index (ABI) or Toe Brachial Index (TBI) performed, please see complete report     ADDITIONAL FINDINGS: . No internal vessel narrowing noted within the right bypass graft or anastomosis. . Elevated velocity noted in the right common femoral artery. . Patent femoral-femoral bypass graft noted.    IMPRESSION: 1. Patent right leg bypass graft with internal stenosis noted. 2. Doppler velocities suggestive are mildly decreased in the common femoral artery compared to previous exam. 3. The right external iliac artery waveforms are monophasic suggestive of a possible hemodynamically significant proximal iliac level arterial disease. 4. Comparison to the previous exam is noted on the second page of this report.       Compared to the previous exam:  Mild decrease in right common femoral artery velocities and a mild decrease in left ABI when compared to previous exam.       ASSESSMENT: [name] is a 81 y.o. male who is s/p right femoral-popliteal bypass grafting with Gore-Tex in February 2015 and his ulcerated toe successfully healed. He has a right to left femoral-femoral bypass  and an occluded left femoral-popliteal above-knee Gore-Tex graft. He had a right common iliac artery stent placed on 04/20/13.  He has Alzheimer's Disease, is oriented to person only, he follows commands appropriately. His wife reports his history. His dyspnea seems to limit his walking, he has CHF and COPD. His wife has been  increasing his walking by walking with him, he uses a walker to sit and rest as needed. He has no signs of ischemia in his legs or feet. Wife states pt has no history of falling.  Today's right LE arterial duplex suggests a patent right leg bypass graft with internal stenosis noted. Doppler velocities are mildly decreased in the common femoral artery compared to previous exam. The right external iliac artery waveforms are monophasic suggestive of a possible hemodynamically significant proximal iliac level arterial disease. Mild decrease in right common femoral artery velocities and a mild decrease in left ABI when compared to previous exam. ABI's with all monophasic waveforms.      PLAN:  Continue graduated walking program and daily seated leg exercises as discussed with wife.  Based on the patient's vascular studies and examination, pt will return to clinic in 1 year with duplex scan of his right femoral-popliteal graft and repeat ABIs and also scan of his right to left femoral-femoral bypass.  I discussed in depth with the patient the nature of atherosclerosis, and emphasized the importance of maximal medical management including strict control of blood pressure, blood glucose, and lipid levels, obtaining regular exercise, and continued cessation of smoking.  The patient is aware that without maximal medical management the underlying atherosclerotic disease process will progress, limiting the benefit of any interventions.  The patient was given information about PAD including signs, symptoms, treatment, what symptoms should prompt the patient to seek immediate medical  care, and risk reduction measures to take.  Clemon Chambers, RN, MSN, FNP-C [facility] Phone: [phone]  Clinic MD: Kellie Simmering  07/05/2015 10:23 AM

## 2015-07-11 ENCOUNTER — Telehealth: Payer: Self-pay | Admitting: Internal Medicine

## 2015-07-11 NOTE — Telephone Encounter (Signed)
[name] came into the office requesting Symbicort samples. She was given two samples of Symbicort samples. She voiced understanding and had no further questions. Nothing further needed.

## 2015-07-12 ENCOUNTER — Encounter: Payer: Self-pay | Admitting: Adult Health

## 2015-07-12 ENCOUNTER — Ambulatory Visit (INDEPENDENT_AMBULATORY_CARE_PROVIDER_SITE_OTHER): Payer: Commercial Managed Care - HMO

## 2015-07-12 ENCOUNTER — Telehealth: Payer: Self-pay | Admitting: Adult Health

## 2015-07-12 ENCOUNTER — Ambulatory Visit (INDEPENDENT_AMBULATORY_CARE_PROVIDER_SITE_OTHER): Payer: Commercial Managed Care - HMO | Admitting: Family Medicine

## 2015-07-12 VITALS — BP 130/60 | HR 68 | Temp 98.1°F | Resp 18 | Ht 70.0 in | Wt 177.0 lb

## 2015-07-12 DIAGNOSIS — Z8709 Personal history of other diseases of the respiratory system: Secondary | ICD-10-CM | POA: Diagnosis not present

## 2015-07-12 DIAGNOSIS — R609 Edema, unspecified: Secondary | ICD-10-CM

## 2015-07-12 DIAGNOSIS — R0602 Shortness of breath: Secondary | ICD-10-CM

## 2015-07-12 DIAGNOSIS — F0390 Unspecified dementia without behavioral disturbance: Secondary | ICD-10-CM | POA: Diagnosis not present

## 2015-07-12 DIAGNOSIS — Z8679 Personal history of other diseases of the circulatory system: Secondary | ICD-10-CM | POA: Diagnosis not present

## 2015-07-12 DIAGNOSIS — M25473 Effusion, unspecified ankle: Secondary | ICD-10-CM

## 2015-07-12 LAB — POCT CBC
Granulocyte percent: 69.4 %G (ref 37–80)
HEMATOCRIT: 40.9 % — AB (ref 43.5–53.7)
Hemoglobin: 14 g/dL — AB (ref 14.1–18.1)
LYMPH, POC: 1.5 (ref 0.6–3.4)
MCH, POC: 30.3 pg (ref 27–31.2)
MCHC: 34.2 g/dL (ref 31.8–35.4)
MCV: 88.7 fL (ref 80–97)
MID (CBC): 0.8 (ref 0–0.9)
MPV: 7.5 fL (ref 0–99.8)
POC GRANULOCYTE: 5.2 (ref 2–6.9)
POC LYMPH %: 20.5 % (ref 10–50)
POC MID %: 10.1 % (ref 0–12)
Platelet Count, POC: 256 10*3/uL (ref 142–424)
RBC: 4.61 M/uL — AB (ref 4.69–6.13)
RDW, POC: 14.6 %
WBC: 7.5 10*3/uL (ref 4.6–10.2)

## 2015-07-12 LAB — GLUCOSE, POCT (MANUAL RESULT ENTRY): POC Glucose: 87 mg/dl (ref 70–99)

## 2015-07-12 NOTE — Telephone Encounter (Signed)
[name] return my call. I explained the below and that we needed some sort of proof of income. She voiced understanding and had no further questions. She states that she will bring a copy tomorrow. She voiced understanding and had no further questions. Nothing further needed at this time.

## 2015-07-12 NOTE — Telephone Encounter (Signed)
Received pt's [employer] patient assistant forms.After reviewing forms pt did not place any proof of income.  Per patient assistance we must have a proof of income  Proof of household income (include only one of the following): Marland Kitchen A copy of last year's federal income tax returns for yourself, spouse, and dependents . All income statements from jobs last year ([place] or 1099) . Two current paystubs . Current Social Security Income Surveyor, quantity . If current household income is zero, a letter explaining your financial situation from a family member, healthcare provider, or yourself  LVM for pt to return call to review the above

## 2015-07-12 NOTE — Patient Instructions (Addendum)
Chest x-ray looks clear with no enlargement of the heart, so I do not believe that he is having major problems with congestive heart failure and there is no sign of a pneumonia.  The CBC white count is entirely normal  The EKG is unchanged from the last EKG.  Because of the increased swelling in the ankles we will have you increase his furosemide (Lasix) 240 mg (220) daily for 3 or 4 days, then resume the 20 mg dose. We would let you know the results of the additional labs and whether this should be changed longer term or not.  Make sure you follow up with his primary care and cardiologist in the not too distant future.  Return at anytime if worse or further concerns. If abruptly shorter of breath go to the emergency room.    IF you received an x-ray today, you will receive an invoice from [facility]. Please contact [facility] at [phone] with questions or concerns regarding your invoice.   IF you received labwork today, you will receive an invoice from [employer]. Please contact [facility] at [phone] with questions or concerns regarding your invoice.   Our billing staff will not be able to assist you with questions regarding bills from these companies.  You will be contacted with the lab results as soon as they are available. The fastest way to get your results is to activate your My Chart account. Instructions are located on the last page of this paperwork. If you have not heard from [place] regarding the results in 2 weeks, please contact this office.

## 2015-07-12 NOTE — Progress Notes (Signed)
Patient ID: [name], male    DOB: Aug 04, 1934  Age: 81 y.o. MRN: [id number]  Chief Complaint  Patient presents with  . Leg Swelling    Subjective:   81 year old gentleman who is brought in here today by his wife. He's had progressive dementia, and cannot really tell what is going on with himself. His wife has to drive for him. He walks only a short distance and he gets very short of breath. That is been worse lately. He is developing more ankle edema last few days. He has problems in the night getting choked up and coughing. He stands up and walks around. He has not smoked for many years. He does see other doctors including his primary care, and Dr. Nadyne Coombes for cardiology. He is seen Dr. Shyrl Numbers, pulmonologist. They contacted the PCP office and she recommended he come on in here so he could also have a chest x-ray.  He is on a list of medications including pulmonary and CHF type meds.  Current allergies, medications, problem list, past/family and social histories reviewed.  Objective:  BP 130/60 mmHg  Pulse 68  Temp(Src) 98.1 F (36.7 C) (Oral)  Resp 18  Ht 5\' 10"  (1.778 m)  Wt 177 lb (80.287 kg)  BMI 25.40 kg/m2  SpO2 95%  Pleasant, talkative but not able to give accurate information. His throat is clear. Neck supple without nodes. No JVD noted when seated 9. His chest is fairly clear to auscultation, minimal congestion at the bases. Heart regular without murmur. Abdomen soft. Extremities have 2+ edema of the ankle with minimal pitting.  Assessment & Plan:   Assessment: 1. Shortness of breath   2. Ankle edema   3. History of CHF (congestive heart failure)   4. History of COPD   5. Dementia, without behavioral disturbance       Plan: Check chest x-ray, EKG, and labs  Orders Placed This Encounter  Procedures  . DG Chest 2 View    Order Specific Question:  Reason for Exam (SYMPTOM  OR DIAGNOSIS REQUIRED)    Answer:  short of breath, edema, history COPD, history CHF   Order Specific Question:  Preferred imaging location?    Answer:  External  . Basic metabolic panel  . Brain natriuretic peptide  . POCT CBC  . POCT glucose (manual entry)  . EKG 12-Lead    No orders of the defined types were placed in this encounter.   Chest x-ray without cardiomegaly or effusion or infiltrate. Radiologic reading is pending.  Results for orders placed or performed in visit on 07/12/15  POCT CBC  Result Value Ref Range   WBC 7.5 4.6 - 10.2 K/uL   Lymph, poc 1.5 0.6 - 3.4   POC LYMPH PERCENT 20.5 10 - 50 %L   MID (cbc) 0.8 0 - 0.9   POC MID % 10.1 0 - 12 %M   POC Granulocyte 5.2 2 - 6.9   Granulocyte percent 69.4 37 - 80 %G   RBC 4.61 (A) 4.69 - 6.13 M/uL   Hemoglobin 14.0 (A) 14.1 - 18.1 g/dL   HCT, POC 40.9 (A) 43.5 - 53.7 %   MCV 88.7 80 - 97 fL   MCH, POC 30.3 27 - 31.2 pg   MCHC 34.2 31.8 - 35.4 g/dL   RDW, POC 14.6 %   Platelet Count, POC 256 142 - 424 K/uL   MPV 7.5 0 - 99.8 fL  POCT glucose (manual entry)  Result Value Ref Range  POC Glucose 87 70 - 99 mg/dl   EKG has no acute changes. Nonspecific ST T-waves present.  I don't find anything acute going on except for the edema in his feet. That may be mobilizing at night making him short of breath and coughing. We will see what is remaining labs show. See instructions.   Patient Instructions   Chest x-ray looks clear with no enlargement of the heart, so I do not believe that he is having major problems with congestive heart failure and there is no sign of a pneumonia.  The CBC white count is entirely normal  The EKG is unchanged from the last EKG.  Because of the increased swelling in the ankles we will have you increase his furosemide (Lasix) 240 mg (220) daily for 3 or 4 days, then resume the 20 mg dose. We would let you know the results of the additional labs and whether this should be changed longer term or not.  Make sure you follow up with his primary care and cardiologist in the not  too distant future.  Return at anytime if worse or further concerns. If abruptly shorter of breath go to the emergency room.    IF you received an x-ray today, you will receive an invoice from [facility]. Please contact [facility] at [phone] with questions or concerns regarding your invoice.   IF you received labwork today, you will receive an invoice from [employer]. Please contact [facility] at [phone] with questions or concerns regarding your invoice.   Our billing staff will not be able to assist you with questions regarding bills from these companies.  You will be contacted with the lab results as soon as they are available. The fastest way to get your results is to activate your My Chart account. Instructions are located on the last page of this paperwork. If you have not heard from [place] regarding the results in 2 weeks, please contact this office.          Return if symptoms worsen or fail to improve.   Gradie Butrick, MD 79/18/2017

## 2015-07-13 LAB — BASIC METABOLIC PANEL
BUN: 13 mg/dL (ref 7–25)
CO2: 27 mmol/L (ref 20–31)
CREATININE: 1.13 mg/dL (ref 0.70–1.18)
Calcium: 9.3 mg/dL (ref 8.6–10.3)
Chloride: 102 mmol/L (ref 98–110)
Glucose, Bld: 77 mg/dL (ref 65–99)
Potassium: 4.2 mmol/L (ref 3.5–5.3)
Sodium: 141 mmol/L (ref 135–146)

## 2015-07-13 LAB — BRAIN NATRIURETIC PEPTIDE: BRAIN NATRIURETIC PEPTIDE: 108 pg/mL — AB (ref <100)

## 2015-08-04 NOTE — Addendum Note (Signed)
Addended by: Dorothyann Gibbs on: 08/04/2015 09:53 AM   Modules accepted: Orders

## 2015-12-27 ENCOUNTER — Ambulatory Visit: Payer: Commercial Managed Care - HMO | Admitting: Internal Medicine

## 2015-12-28 ENCOUNTER — Ambulatory Visit (INDEPENDENT_AMBULATORY_CARE_PROVIDER_SITE_OTHER): Payer: Commercial Managed Care - HMO | Admitting: Internal Medicine

## 2015-12-28 ENCOUNTER — Encounter: Payer: Self-pay | Admitting: Internal Medicine

## 2015-12-28 ENCOUNTER — Ambulatory Visit (INDEPENDENT_AMBULATORY_CARE_PROVIDER_SITE_OTHER): Payer: Commercial Managed Care - HMO

## 2015-12-28 VITALS — BP 128/72 | HR 70 | Ht 70.0 in | Wt 168.0 lb

## 2015-12-28 DIAGNOSIS — Z23 Encounter for immunization: Secondary | ICD-10-CM

## 2015-12-28 DIAGNOSIS — J449 Chronic obstructive pulmonary disease, unspecified: Secondary | ICD-10-CM

## 2015-12-28 MED ORDER — PREDNISONE 10 MG PO TABS: ORAL_TABLET | ORAL | 0 refills | Status: DC

## 2015-12-28 MED ORDER — BUDESONIDE-FORMOTEROL FUMARATE 160-4.5 MCG/ACT IN AERO: 2.0000 | INHALATION_SPRAY | Freq: Two times a day (BID) | RESPIRATORY_TRACT | 0 refills | Status: DC

## 2015-12-28 NOTE — Patient Instructions (Addendum)
Prednisone 10 mg take  4 each am x 2 days,   2 each am x 2 days,  1 each am x 2 days and stop   Work on inhaler technique:  relax and gently blow all the way out then take a nice smooth deep breath back in, triggering the inhaler at same time you start breathing in.  Hold for up to 5 seconds if you can. Blow out thru nose. Rinse and gargle with water when done     Please schedule a follow up office visit in 4 weeks, call sooner if needed with all your medications to be sure they are being used correctly  - ? Needs performist/budesonide / note not using med calendar at all > set up f/u with Tammy NP

## 2015-12-28 NOTE — Progress Notes (Signed)
Subjective:     Patient ID: [name], male   DOB: Aug 29, 1934  MRN: [id number]     Brief patient profile:  81  yobm s/p smoking cessation in 1995 previously felt to have COPD  GOLD II criteria with reversibility 01/06/09    History of Present Illness  January 06, 2009 Followup with PFT's. Pt states that overall breathing has been okay. He gets SOB when he gets in a rush. [name] states that pt gets out of breath just walking from one room to the next. Also he c/o dry cough- especially at night x 4-6 weeks eval by [facility] with cxr and rx cough syrup > no better. no purulent sputum. Try higher strength of Symbicort 160 2 puffs first thing in am and 2 puffs again in pm about 12 hours later and if happy with it fill the prescription.  as long as you are coughing take the zegerid otc at bedtime > seemed to help to pt and wife's satisfaction.      09/01/2013 Acute OV  Complains of increased SOB especially w/ exertion, wheezing, some dry cough x2-3weeks. No fever or discolored mucus  Occurred while at [facility]. No leg swelling, calf pain or edema.  At rest no significant dyspnea. But cant walk 10 feet without severe dyspnea, cough and wheezing.  Today in office with desats in 70% with ambulation.  EKG shows ST depression .  O2 sats at rest 100% on RA  No chest pain, n/v/d, hemoptysis , fever.  Denies f/c/s, hemoptysis, nausea, vomiting. No calf pain rec admit  Admit date: 09/01/2013  Discharge date: 09/07/2013  Recommendations for Outpatient Follow-up:  1. Acute hypoxic respiratory failure, multifactorial, patient newly started on home oxygen 2 L per minute continuous 2. Symptomatic microcytic anemia, status post iron infusion, consider repeat CBC in the next few weeks to assess response to oral iron therapy. 3. Evidence of worsened systolic congestive heart failure currently compensated. Currently euvolemic without diuretics. 4. Resolution of COPD exacerbation and possible  community acquired pneumonia. 5. Metformin has been discontinued secondary to his heart failure, blood sugars have been well controlled during this hospitalization without significant use of insulin or other agents. For now suggest continue monitor blood sugars and hold medication  6.  Discharge Diagnoses:  1. Acute hypoxic respiratory failure 2. Symptomatic microcytic anemia secondary to Candida esophagitis 3. Possible acute on chronic systolic congestive heart failure 4. Candida esophagitis 5. COPD, possible exacerbation 6. Possible [facility] pneumonia 7. Diabetes mellitus type 2 currently diet controlled Discharge Condition: Improved  Disposition: Home with home health RN, PT  Diet recommendation: Heart healthy diabetic diet  Filed Weights    09/05/13 0400  09/06/13 0410  09/07/13 0403   Weight:  74.707 kg (164 lb 11.2 oz)  74.2 kg (163 lb 9.3 oz)  72.7 kg (160 lb 4.4 oz)   History of present illness:  81 year old man presented with progressive dyspnea on exertion for 3 weeks, asymptomatic at rest, hypoxic with ambulation his pulmonologist office. He was sent to the emergency department for further evaluation of hypoxia. Initial evaluation revealed acute respiratory failure with hypoxia, mild CHF , possible exertional angina.  Hospital Course:  Mr. [name] was admitted for further evaluation of respiratory failure with hypoxia, abnormal EKG and possible COPD. He was seen in consultation with cardiology, cardiac enzymes were negative and there were no signs or symptoms to suggest acute coronary syndrome. 2-D echocardiogram did reveal worsening of chronic systolic heart failure, cardiology recommended  medical management with no further intervention. His respiratory status has remained stable without evidence of volume overload without diuretic therapy. He is to have microcytic anemia and seen in consultation with gastroenterology, underwent EGD which revealed Candida esophagitis, see full  report below. He is also felt to have a COPD exacerbation and possible pneumonia. With treatment of these issues his condition is improved at the time of discharge she is quite stable but will her car home oxygen. Individual issues as below. Beta blocker was recommended for his heart disease, he appears to be tolerating this well from a pulmonary standpoint.  1. Acute hypoxic respiratory failure. No hypoxia except with ambulation. Multifactorial, favor progression of COPD, possible pneumonia, anemia, systolic heart failure. 2. Symptomatic anemia. EGD revealed Candida esophagitis, gastric ulceration, moderate erosive esophagitis. Anemia felt to be multifactorial but esophagitis likely contributing. Stable. 3. Suspect subacute systolic congestive heart failure on admission. Echocardiogram with significant decrease in left ventricular ejection fraction. Suspect several week history of dyspnea on exertion related to decreased heart function combined with anemia and COPD. Discussed in detail with the patient's cardiologist recommended starting beta blocker, digoxin 4. Candida esophagitis. 5. Elevated troponin on admission. Subsequent troponins negative. No evidence of ACS on admission. 6. COPD possible exacerbation. Appears resolved. 7. Possible Community acquired pneumonia. Appears resolved. Finish Levaquin. 8. Diabetes mellitus type 2. Well controlled. 9. Alzheimer's dementia. Stable, at baseline 10. Tobacco dependence. Recommend cessation. Plan discharge home today with home health RN, PT.  Complete steroid taper, antibiotics  Iron on discharge, will need 7 days of Nystatin. PPI BID for 3 months and then QD. Followup with GI as needed. Consultants:  Cardiology Procedures:  Transfusion one unit packed red blood cells  EGD ENDOSCOPIC IMPRESSION: 1. Candida Esophagitis  2. Moderate Erosive Esophagitis  3. Mild Antral Gastritis  4. Gastric Ulceration  5. Small Hiatal Hernia  6. Minimal Duodenitis   7. Anemia likely multifactorial but esophagitis likely contributing  to it  Antibiotics:  Levaquin 6/10 >> 6/17 Discharge Instructions  Discharge Instructions    Diet - low sodium heart healthy  Complete by: As directed       Diet Carb Modified  Complete by: As directed       Discharge instructions  Complete by: As directed    Call physician or seek immediate medical attention for increased shortness of breath, chest pain, bleeding or worsening in condition. Continue to check blood sugars every day. Metformin has been discontinued because of your heart weakness. However your blood sugars have been well controlled during her hospital stay. Call your physician for blood sugars greater than 400 or less than 70.     Increase activity slowly  Complete by: As directed              Medication List     STOP taking these medications       clopidogrel 75 MG tablet    Commonly known as: PLAVIX    metFORMIN 500 MG tablet    Commonly known as: GLUCOPHAGE     TAKE these medications       albuterol (2.5 MG/3ML) 0.083% nebulizer solution    Commonly known as: PROVENTIL    Take 2.5 mg by nebulization every 6 (six) hours as needed for wheezing or shortness of breath.    aspirin 81 MG EC tablet    Take 1 tablet (81 mg total) by mouth daily.    budesonide-formoterol 160-4.5 MCG/ACT inhaler    Commonly known as: SYMBICORT  Inhale 2 puffs into the lungs 2 (two) times daily.    digoxin 0.125 MG tablet    Commonly known as: LANOXIN    Take 1 tablet (0.125 mg total) by mouth daily.    ferrous sulfate 325 (65 FE) MG EC tablet    Take 1 tablet (325 mg total) by mouth daily with breakfast.    levofloxacin 500 MG tablet    Commonly known as: LEVAQUIN    Take 1 tablet (500 mg total) by mouth daily. Start 6/16 in the morning.    metoprolol tartrate 25 MG tablet    Commonly known as: LOPRESSOR    Take 0.5 tablets (12.5 mg total) by mouth 2 (two) times daily.    nystatin 100000 UNIT/ML suspension     Commonly known as: MYCOSTATIN    Take 5 mLs (500,000 Units total) by mouth 4 (four) times daily.    olmesartan 40 MG tablet    Commonly known as: BENICAR    Take 40 mg by mouth daily.    pantoprazole 40 MG tablet    Commonly known as: PROTONIX    Take 1 tablet (40 mg total) by mouth 2 (two) times daily.    predniSONE 10 MG tablet    Commonly known as: DELTASONE    Start 6/16 in AM. Take 20 mg by mouth daily for 3 days. Then take 10 mg by mouth daily for 3 days. Then stop.    rosuvastatin 20 MG tablet    Commonly known as: CRESTOR    Take 20 mg by mouth at bedtime.    Vitamin D 2000 UNITS Caps    Take 1 capsule by mouth at bedtime.      09/23/2013 post f/u ov/extensive ov at transition of care/Dominga Mcduffie re: 02 24/7 at 2lpm Chief Complaint  Patient presents with  . HFU    Pt states breathing is some better, but not back to baseline. He c/o increased cough that started before he was d/c'ed- cough is non prod.   no worse since finished prednisone  Now on 02 continuously  @ 2lpm  Cough is mostly daytime, not assoc with meals Confused with meds rec Work on inhaler technique:     Only use your albuterol  as a rescue medication     06/04/2014 f/u ov/Dudley Cooley re: COPD II    Chief Complaint  Patient presents with  . Acute Visit    Pt c/o increased SOB for the past 2 days.  He is SOB with exertion and spouse states he is gasping for air in his sleep.  He also c/o cough more than usual- occ prod with minimal clear sputum.    maint rx = symbicort 160 2bid   Has nebulizer but rarely uses >>pred taper and start on tudorza   12/22/2014 NP Follow up : [place] /COPD GOLD II  Pt returns for 6 month follow up .  He is accompanied by his wife.  Pt has moderate to severe dementia.  Wife answers most of his questions , she is his caregiver.  We reviewed his meds and organized them into a med calendar . Wife says he has hit the doughnut hole and it is hard to afford his inhalers. We discussed changing to  Nebs as it is also hard to coordinate MDI use .  He is taking lisinopril and divoan, it appears to have been prescribed by two different providers. Suggested he stop ACE Inhibitor. Continue on Diovan and discuss regimen with PCP .  Overall says his breathing has been good with no flare of cough or dyspnea. No ER /hosp visits since last ov .  No fever, chest pain, orthopnea or edema.  She was somewhat upset because she has a lot of responsibiltiy with his care and they missed an appointment here because he took the phone call for the ov and forgot to tell her. .  Support provided. Finances are also tough.  Needs flu shot today . Prevnar and Pneumovax utd.  According to review of hospital records he was admitted in 11/2013 with PNA and DVT . Tx w/ Xarelto ? For 6 months as he is no longer taking.      06/24/2015 NP Follow up : COPD GOLD II  Pt returns for 6 months follow up .  Pt states his breathing and BP is doing well and has no new complaints at this time.  Doing great for last 6 months, no flare of cough or wheezing.  Remains on Symbicort . Assistance through the company . Needs to reapply for assistance .  Lisinopril stopped last ov , cough and runny nose are much better.  Wife is pt cargiver as he has moderate to advanced dementia.  rec Continue on Symbicort Twice daily  , rinse after use.        12/28/2015  f/u ov/Halford Goetzke re: COPD GOLD II  On symbicort but hfa questionable, using neb more than baseline/ no med calendar  Chief Complaint  Patient presents with  . Follow-up    Breathing is doing well. He wheezes at night sometimes. He never uses albuterol inhaler, but has recently starting using neb 2 x daily.    Not limited by breathing from desired activities  But very sedentary and becoming much more forgetful   No obvious day to day or daytime variability or assoc excess/ purulent sputum or mucus plugs or hemoptysis or cp or chest tightness, subjective wheeze or overt sinus or hb  symptoms. No unusual exp hx or h/o childhood pna/ asthma or knowledge of premature birth.  Sleeping ok without nocturnal  or early am exacerbation  of respiratory  c/o's or need for noct saba. Also denies any obvious fluctuation of symptoms with weather or environmental changes or other aggravating or alleviating factors except as outlined above   Current Medications, Allergies, Complete Past Medical History, Past Surgical History, Family History, and Social History were reviewed in [employer] record.  ROS  The following are not active complaints unless bolded sore throat, dysphagia, dental problems, itching, sneezing,  nasal congestion or excess/ purulent secretions, ear ache,   fever, chills, sweats, unintended wt loss, classically pleuritic or exertional cp,  orthopnea pnd or leg swelling, presyncope, palpitations, abdominal pain, anorexia, nausea, vomiting, diarrhea  or change in bowel or bladder habits, change in stools or urine, dysuria,hematuria,  rash, arthralgias, visual complaints, headache, numbness, weakness or ataxia or problems with walking or coordination,  change in mood/affect or memory.                               Past Medical History:  HYPERLIPIDEMIA (ICD-272.4)  PVD (ICD-443.9)  HYPERTENSION NEC  DYSPNEA (ICD-786.05)  COPD  PFT's 11/18/00 FEV1 is 1.94 or 60%  - PFT's 01/06/09 FEV1 1.75 (57%) ratio 49 and 21 % better FVC after B2 DLC0 71  - HFA 90% better p coaching 01/23/10  -med calendar 12/22/2014  Objective:   Physical Exam   elderly black male ambulatory     Vitals signs reviewed  - sats 99% on arrival RA  Wt  187 January 06, 2009 > 186 01/23/10 > 05/04/2011  191 > 06/16/2013  170 >169 09/01/2013 >   169 09/23/2013 > 06/04/2014  181 >171 12/22/2014 > 12/28/2015  168   HEENT mild turbinate edema. Edentulous,  Oropharynx no thrush or excess pnd or cobblestoning. No JVD or cervical adenopathy. Mild accessory muscle  hypertrophy. Trachea midline, nl thryroid. Chest was hyperinflated by percussion with diminished breath sounds and bilateral insp/exp rhonchi.    Regular rate and rhythm without murmur gallop or rub or increase P2. No edema , neg homans sign Abd: no hsm, nl excursion. Ext warm without cyanosis or clubbing. Pleasant but  Confused/ doesn't know president or any recent national events                    Assessment:

## 2015-12-29 ENCOUNTER — Encounter: Payer: Self-pay | Admitting: Internal Medicine

## 2015-12-29 NOTE — Assessment & Plan Note (Signed)
PFT's 11/18/00 FEV1 is 1.94 or 60%  - PFT's 01/06/09 FEV1 1.75 (57%) ratio 49 and 21 % better FVC after B2 DLC0 71  - Prevnar given 09/23/13  - 09/23/2013 p extensive coaching HFA effectiveness =    75%  - 06/04/2014  Trial of tudorza> did not maintain on it  - 12/28/2015  After extensive coaching HFA effectiveness =    50% from baseline 25%   Having a mild decline and not using hfa well so rx with prednisone then bring back for med reconciliation and consider alternative rx if not improving ie BREO vs neb laba/ics   I had an extended discussion with the patient/wife  reviewing all relevant studies completed to date and  lasting 15 to 20 minutes of a 25 minute visit    Each maintenance medication was reviewed in detail including most importantly the difference between maintenance and prns and under what circumstances the prns are to be triggered using an action plan format that is not reflected in the computer generated alphabetically organized AVS.    Please see instructions for details which were reviewed in writing and the patient given a copy highlighting the part that I personally wrote and discussed at today's ov.

## 2016-01-12 ENCOUNTER — Telehealth: Payer: Self-pay

## 2016-01-12 NOTE — Telephone Encounter (Signed)
Reported pt's symptoms to Dr. Oneida Alar, in office.  Recommended to have pt. Seen within next week with arterial studies.  Will contact pt's wife re: appt. information.

## 2016-01-12 NOTE — Telephone Encounter (Signed)
Wife called on pt's behalf.  Stated the pt. Started having swelling and redness and warmth of right foot yesterday, and the symptoms are a little worse today.  Stated his feet are usually cold.  Reported that the right foot swelling involves the toes, foot, and slightly above the ankle.  Unable to determine if pt. has any increased pain, due to his dementia.  Denied any open sores.  Reported she has not taken his temp.  Will discuss with MD in office.

## 2016-01-13 ENCOUNTER — Other Ambulatory Visit: Payer: Self-pay | Admitting: *Deleted

## 2016-01-13 ENCOUNTER — Ambulatory Visit: Payer: Commercial Managed Care - HMO | Admitting: Family

## 2016-01-13 ENCOUNTER — Encounter (HOSPITAL_COMMUNITY): Payer: Commercial Managed Care - HMO

## 2016-01-13 ENCOUNTER — Ambulatory Visit (HOSPITAL_COMMUNITY): Payer: Commercial Managed Care - HMO

## 2016-01-13 DIAGNOSIS — M79604 Pain in right leg: Secondary | ICD-10-CM

## 2016-01-13 DIAGNOSIS — L539 Erythematous condition, unspecified: Secondary | ICD-10-CM

## 2016-01-16 ENCOUNTER — Ambulatory Visit (HOSPITAL_COMMUNITY)
Admission: RE | Admit: 2016-01-16 | Discharge: 2016-01-16 | Disposition: A | Discharge status: Home / Self Care | Payer: Commercial Managed Care - HMO | Source: Ambulatory Visit | Attending: Vascular Surgery | Admitting: Vascular Surgery

## 2016-01-16 ENCOUNTER — Other Ambulatory Visit: Payer: Self-pay

## 2016-01-16 ENCOUNTER — Ambulatory Visit (INDEPENDENT_AMBULATORY_CARE_PROVIDER_SITE_OTHER): Payer: Commercial Managed Care - HMO | Admitting: Family

## 2016-01-16 ENCOUNTER — Encounter: Payer: Self-pay | Admitting: Family

## 2016-01-16 ENCOUNTER — Ambulatory Visit (INDEPENDENT_AMBULATORY_CARE_PROVIDER_SITE_OTHER)
Admission: RE | Admit: 2016-01-16 | Discharge: 2016-01-16 | Disposition: A | Discharge status: Home / Self Care | Payer: Commercial Managed Care - HMO | Source: Ambulatory Visit | Attending: Vascular Surgery | Admitting: Vascular Surgery

## 2016-01-16 VITALS — BP 150/60 | HR 60 | Temp 97.3°F | Resp 20 | Ht 70.0 in | Wt 166.0 lb

## 2016-01-16 DIAGNOSIS — I779 Disorder of arteries and arterioles, unspecified: Secondary | ICD-10-CM | POA: Diagnosis not present

## 2016-01-16 DIAGNOSIS — Z87891 Personal history of nicotine dependence: Secondary | ICD-10-CM | POA: Diagnosis not present

## 2016-01-16 DIAGNOSIS — Z86718 Personal history of other venous thrombosis and embolism: Secondary | ICD-10-CM | POA: Diagnosis not present

## 2016-01-16 DIAGNOSIS — M79604 Pain in right leg: Secondary | ICD-10-CM

## 2016-01-16 DIAGNOSIS — Z95828 Presence of other vascular implants and grafts: Secondary | ICD-10-CM

## 2016-01-16 DIAGNOSIS — L539 Erythematous condition, unspecified: Secondary | ICD-10-CM | POA: Diagnosis not present

## 2016-01-16 DIAGNOSIS — I7789 Other specified disorders of arteries and arterioles: Secondary | ICD-10-CM | POA: Insufficient documentation

## 2016-01-16 NOTE — Progress Notes (Signed)
[facility] [place]   CC: Follow up peripheral artery occlusive disease  History of Present Illness [name] is a 81 y.o. male patient of Dr. Kellie Simmering who is s/p right femoral-popliteal bypass grafting with Gore-Tex in February 2015 and his ulcerated toe successfully healed. He has a right to left femoral-femoral bypass and an occluded left femoral-popliteal above-knee Gore-Tex graft. He had a right common iliac artery stent placed on 04/20/13.  Pt last saw Dr. Kellie Simmering on 05/18/14. At that time Dr. Kellie Simmering did not think patient was having rest pain in the left foot to the degree that he would require attempt at a revascularization which has low chance of success. He would need below-knee femoral-popliteal bypass grafting with Gore-Tex and he has a failed graft on the left side. He currently has no nonhealing ulcers or limb threatening ischemia. Ulcerations in the right foot have healed. Dr. Kellie Simmering discussed this at length with patient's wife and she was in agreement. Dr. Kellie Simmering stressed the importance of avoiding pressure sores or ulcerations. Dr. Evelena Leyden 05/18/14 assessment note indicates that If the patient does develop a pressure sore or nonhealing ulcer in the left foot he is a candidate for attempt at femoral-popliteal grafting on the left.  He returns today with wife report that pt started having swelling and redness and warmth of right foot on 01/11/16, and the symptoms are a little worse when reported on 01/12/16.  Stated his feet are usually cold.  Reported that the right foot swelling involves the toes, foot, and slightly above the ankle.  Unable to determine if pt. has any increased pain, due to his dementia.  Denied any open sores.  Wife states that pt's shortness of breath limits his walking, he does not seem to elicit claudication symptoms, but wife states she is walking with him daily to get him to walk, she states he uses his walker to sit when he gets tired.    Wife states pt has Alzheimer's Disease which has worsened.   Dr. Einar Gip is pt's cardiologist, wife states pt has CHF, denies any known history of MI.  Wife also denies any known history of pt having a stroke or TIA.  Pt Diabetic: "prediabetic" Pt smoker: former smoker, quit in the 1990's  Pt meds include: Statin :Yes Betablocker: Yes ASA: Yes Other anticoagulants/antiplatelets: no      Past Medical History:  Diagnosis Date  . Alzheimer's dementia 2013  . Anemia    HX OF ANEMIA  . CHF (congestive heart failure) ([place])   . Colon cancer ([place])   . COPD (chronic obstructive pulmonary disease) ([place])   . Depression   . Dyspnea   . Hyperlipidemia   . Hypertension   . Pneumonia 2014  . PVD (peripheral vascular disease) ([place])   . Type II diabetes mellitus ([place])     Social History Social History  Substance Use Topics  . Smoking status: Former Smoker    Packs/day: 0.30    Years: 40.00    Types: Cigarettes    Quit date: 03/26/1993  . Smokeless tobacco: Never Used  . Alcohol use No    Family History Family History  Problem Relation Age of Onset  . Stroke Mother 63  . Hypertension Mother   . Cancer Sister   . Heart disease Sister   . Heart disease Brother     Heart Disease before age 27  . Heart attack Brother   . Cancer Sister   . Cancer Brother  Past Surgical History:  Procedure Laterality Date  . ABDOMINAL ANGIOGRAM N/A 04/20/2013   Procedure: ABDOMINAL ANGIOGRAM;  Surgeon: Angelia Mould, MD;  Location: [facility] CATH LAB;  Service: Cardiovascular;  Laterality: N/A;  . CATARACT EXTRACTION Right 02/2013  . CATARACT EXTRACTION W/ INTRAOCULAR LENS IMPLANT Left 01/2013  . COLON RESECTION  1997   /enc. notes 05/17/2004 (04/28/2013)  . COLON SURGERY  1996   cancer  . ESOPHAGOGASTRODUODENOSCOPY N/A 09/05/2013   Procedure: ESOPHAGOGASTRODUODENOSCOPY (EGD);  Surgeon: Lear Ng, MD;  Location: [facility] ENDOSCOPY;  Service: Endoscopy;  Laterality: N/A;  .  FEMORAL ARTERY - FEMORAL ARTERY BYPASS GRAFT     right to left/enc. notes 05/17/2004 (04/28/2013)  . FEMORAL BYPASS Right    /enc. notes 05/17/2004 (04/28/2013)  . FEMORAL-POPLITEAL BYPASS GRAFT Right 04/29/2013   Procedure:  FEMORAL-POPLITEAL ARTERY Bypass Graft with intraoperative ultrasound and arteriogarm;  Surgeon: Mal Misty, MD;  Location: [place];  Service: Vascular;  Laterality: Right;  . FIBEROPTIC BRONCHOSCOPY     Yvette Rack. notes 03/16/2005 (04/28/2013)  . INTRAOPERATIVE ARTERIOGRAM Right 04/29/2013   Procedure: INTRA OPERATIVE ARTERIOGRAM;  Surgeon: Mal Misty, MD;  Location: [place];  Service: Vascular;  Laterality: Right;  . LOWER EXTREMITY ANGIOGRAM  04/20/2013   Procedure: LOWER EXTREMITY ANGIOGRAM;  Surgeon: Angelia Mould, MD;  Location: [facility] CATH LAB;  Service: Cardiovascular;;  . LUMBAR [place] SURGERY  2005  . PERCUTANEOUS STENT INTERVENTION  04/20/2013   Procedure: PERCUTANEOUS STENT INTERVENTION;  Surgeon: Angelia Mould, MD;  Location: [facility] CATH LAB;  Service: Cardiovascular;;  right common iliac artery  . SPINE SURGERY    . VASCULAR SURGERY      No Known Allergies  Current Outpatient Prescriptions  Medication Sig Dispense Refill  . albuterol (PROVENTIL HFA;VENTOLIN HFA) 108 (90 BASE) MCG/ACT inhaler Inhale 2 puffs into the lungs every 6 (six) hours as needed for shortness of breath. 1 Inhaler 5  . albuterol (PROVENTIL) (5 MG/ML) 0.5% nebulizer solution Take 2.5 mg by nebulization every 6 (six) hours as needed for shortness of breath.    Marland Kitchen aspirin 81 MG tablet Take 81 mg by mouth every morning.     . budesonide-formoterol (SYMBICORT) 160-4.5 MCG/ACT inhaler Inhale 2 puffs into the lungs 2 (two) times daily. 1 Inhaler 0  . Cholecalciferol (VITAMIN D) 2000 UNITS CAPS Take 2,000 Units by mouth every morning. Take one tablet twice daily    . dextromethorphan-guaiFENesin ([place] DM) 30-600 MG 12hr tablet Reported on 03/17/2015    . digoxin (LANOXIN) 0.125 MG tablet Take  0.125 mg by mouth every morning.     . furosemide (LASIX) 20 MG tablet Take 1 tablet (20 mg total) by mouth daily. (Patient taking differently: Take 20 mg by mouth every morning. ) 5 tablet 0  . metoprolol tartrate (LOPRESSOR) 25 MG tablet Take 0.5 tablets (12.5 mg total) by mouth 2 (two) times daily. 30 tablet 0  . Multiple Vitamins-Minerals (CENTRUM SILVER PO) Take by mouth daily.    . pravastatin (PRAVACHOL) 40 MG tablet Take 40 mg by mouth at bedtime.     . predniSONE (DELTASONE) 10 MG tablet Take  4 each am x 2 days,   2 each am x 2 days,  1 each am x 2 days and stop 14 tablet 0  . TRADJENTA 5 MG TABS tablet Take 1 tablet by mouth daily.    . traZODone (DESYREL) 50 MG tablet Take 50 mg by mouth at bedtime as needed for sleep.  No current facility-administered medications for this visit.     ROS: See HPI for pertinent positives and negatives.   Physical Examination  Vitals:   01/16/16 1047 01/16/16 1051  BP: (!) 156/60 (!) 150/60  Pulse: 60   Resp: 20   Temp: 97.3 F (36.3 C)   TempSrc: Oral   SpO2: 99%   Weight: 166 lb (75.3 kg)   Height: 5\' 10"  (1.778 m)    Body mass index is 23.82 kg/m.  General: A&O to person only, WDWN. Gait: normal Eyes: PERRLA. Pulmonary: Respirations are non labored, no rales, but + rhonchi and wheezes in all posterior fields. + moist cough. Cardiac: regular rhythm, no detected murmur.     Carotid Bruits Right Left   Negative Negative  Aorta is not palpable. Radial pulses: are palpable   VASCULAR EXAM: Extremities without ischemic change, without gangrene; without open wounds. Fem to fem bypass graft has a palpable pulse. Dependent rubor in both feet that resolves with elevation of legs above his heart.      LE Pulses Right Left   FEMORAL faintly  palpable faintly palpable    POPLITEAL not palpable  not palpable   POSTERIOR TIBIAL not palpable  not palpable    DORSALIS PEDIS  ANTERIOR TIBIAL not palpable  not palpable    Abdomen: soft, NT, no palpable masses. Skin: no rashes, no ulcers. Musculoskeletal: no muscle wasting or atrophy. Neurologic: A&O X 2; Appropriate Affect ; SENSATION: normal; MOTOR FUNCTION: moving all extremities equally, motor strength 5/5 throughout. Speech is fluent/normal.  CN 2-12  intact.    ASSESSMENT: ARON WOLFFE is a 81 y.o. male who is s/p right femoral-popliteal bypass grafting with Gore-Tex in February 2015 and his ulcerated toe successfully healed. He has a right to left femoral-femoral bypass and an occluded left femoral-popliteal above-knee Gore-Tex graft. He had a right common iliac artery stent placed on 04/20/13.  He has Alzheimer's Disease, is oriented to person and place, he follows commands appropriately. His wife reports his history. His dyspnea seems to limit his walking, he has CHF and COPD. His wife has been increasing his walking by walking with him, he uses a walker to sit and rest as needed. He has no signs of ischemia in his legs or feet. Wife states pt has no history of falling.  DATA Today's right LE arterial duplex suggests a patent right leg bypass graft with no internal stenosis noted. Patent right femoral to femoral bypass graft with increased inflow velocity in the 50-99% range (379 cm/s today, was 161 cm/s on 07/05/15).  Unable to visualize the right common iliac artery stent due to patient's inability to cooperate and bowel gas.  Increase in fem-fem bypass graft inflow velocity since last exam on 07/05/15.  Bilateral ABI's have declined since 07/05/15, all monophasic waveforms except absent left PT. Right TBI is dampened, left is absent.  Right ABI: 0.50, was 0.72; left ABI: 0.32, was 0.45.  PLAN:  Based on the patient's  vascular studies and examination, and after discussing with Dr. Trula Slade, pt will be scheduled for arteriogram with bilateral run off, possible intervention, by Dr. Trula Slade on 01/24/16.  I discussed in depth with the patient the nature of atherosclerosis, and emphasized the importance of maximal medical management including strict control of blood pressure, blood glucose, and lipid levels, obtaining regular exercise, and continued cessation of smoking.  The patient is aware that without maximal medical management the underlying atherosclerotic disease process will progress, limiting the benefit of  any interventions.  The patient was given information about PAD including signs, symptoms, treatment, what symptoms should prompt the patient to seek immediate medical care, and risk reduction measures to take.  Edwin Chambers, RN, MSN, FNP-C [facility] Phone: [phone]  Clinic MD: Trula Slade  01/16/16 11:05 AM

## 2016-01-16 NOTE — Patient Instructions (Signed)
Peripheral Vascular Disease Peripheral vascular disease (PVD) is a disease of the blood vessels that are not part of your heart and brain. A simple term for PVD is poor circulation. In most cases, PVD narrows the blood vessels that carry blood from your heart to the rest of your body. This can result in a decreased supply of blood to your arms, legs, and internal organs, like your stomach or kidneys. However, it most often affects a person's lower legs and feet. There are two types of PVD.  Organic PVD. This is the more common type. It is caused by damage to the structure of blood vessels.  Functional PVD. This is caused by conditions that make blood vessels contract and tighten (spasm). Without treatment, PVD tends to get worse over time. PVD can also lead to acute ischemic limb. This is when an arm or limb suddenly has trouble getting enough blood. This is a medical emergency. CAUSES Each type of PVD has many different causes. The most common cause of PVD is buildup of a fatty material (plaque) inside of your arteries (atherosclerosis). Small amounts of plaque can break off from the walls of the blood vessels and become lodged in a smaller artery. This blocks blood flow and can cause acute ischemic limb. Other common causes of PVD include:  Blood clots that form inside of blood vessels.  Injuries to blood vessels.  Diseases that cause inflammation of blood vessels or cause blood vessel spasms.  Health behaviors and health history that increase your risk of developing PVD. RISK FACTORS  You may have a greater risk of PVD if you:  Have a family history of PVD.  Have certain medical conditions, including:  High cholesterol.  Diabetes.  High blood pressure (hypertension).  Coronary heart disease.  Past problems with blood clots.  Past injury, such as burns or a broken bone. These may have damaged blood vessels in your limbs.  Buerger disease. This is caused by inflamed blood  vessels in your hands and feet.  Some forms of arthritis.  Rare birth defects that affect the arteries in your legs.  Use tobacco.  Do not get enough exercise.  Are obese.  Are age 50 or older. SIGNS AND SYMPTOMS  PVD may cause many different symptoms. Your symptoms depend on what part of your body is not getting enough blood. Some common signs and symptoms include:  Cramps in your lower legs. This may be a symptom of poor leg circulation (claudication).  Pain and weakness in your legs while you are physically active that goes away when you rest (intermittent claudication).  Leg pain when at rest.  Leg numbness, tingling, or weakness.  Coldness in a leg or foot, especially when compared with the other leg.  Skin or hair changes. These can include:  Hair loss.  Shiny skin.  Pale or bluish skin.  Thick toenails.  Inability to get or maintain an erection (erectile dysfunction). People with PVD are more prone to developing ulcers and sores on their toes, feet, or legs. These may take longer than normal to heal. DIAGNOSIS Your health care provider may diagnose PVD from your signs and symptoms. The health care provider will also do a physical exam. You may have tests to find out what is causing your PVD and determine its severity. Tests may include:  Blood pressure recordings from your arms and legs and measurements of the strength of your pulses (pulse volume recordings).  Imaging studies using sound waves to take pictures of   the blood flow through your blood vessels (Doppler ultrasound).  Injecting a dye into your blood vessels before having imaging studies using:  X-rays (angiogram or arteriogram).  Computer-generated X-rays (CT angiogram).  A powerful electromagnetic field and a computer (magnetic resonance angiogram or MRA). TREATMENT Treatment for PVD depends on the cause of your condition and the severity of your symptoms. It also depends on your age. Underlying  causes need to be treated and controlled. These include long-lasting (chronic) conditions, such as diabetes, high cholesterol, and high blood pressure. You may need to first try making lifestyle changes and taking medicines. Surgery may be needed if these do not work. Lifestyle changes may include:  Quitting smoking.  Exercising regularly.  Following a low-fat, low-cholesterol diet. Medicines may include:  Blood thinners to prevent blood clots.  Medicines to improve blood flow.  Medicines to improve your blood cholesterol levels. Surgical procedures may include:  A procedure that uses an inflated balloon to open a blocked artery and improve blood flow (angioplasty).  A procedure to put in a tube (stent) to keep a blocked artery open (stent implant).  Surgery to reroute blood flow around a blocked artery (peripheral bypass surgery).  Surgery to remove dead tissue from an infected wound on the affected limb.  Amputation. This is surgical removal of the affected limb. This may be necessary in cases of acute ischemic limb that are not improved through medical or surgical treatments. HOME CARE INSTRUCTIONS  Take medicines only as directed by your health care provider.  Do not use any tobacco products, including cigarettes, chewing tobacco, or electronic cigarettes. If you need help quitting, ask your health care provider.  Lose weight if you are overweight, and maintain a healthy weight as directed by your health care provider.  Eat a diet that is low in fat and cholesterol. If you need help, ask your health care provider.  Exercise regularly. Ask your health care provider to suggest some good activities for you.  Use compression stockings or other mechanical devices as directed by your health care provider.  Take good care of your feet.  Wear comfortable shoes that fit well.  Check your feet often for any cuts or sores. SEEK MEDICAL CARE IF:  You have cramps in your legs  while walking.  You have leg pain when you are at rest.  You have coldness in a leg or foot.  Your skin changes.  You have erectile dysfunction.  You have cuts or sores on your feet that are not healing. SEEK IMMEDIATE MEDICAL CARE IF:  Your arm or leg turns cold and blue.  Your arms or legs become red, warm, swollen, painful, or numb.  You have chest pain or trouble breathing.  You suddenly have weakness in your face, arm, or leg.  You become very confused or lose the ability to speak.  You suddenly have a very bad headache or lose your vision.   This information is not intended to replace advice given to you by your health care provider. Make sure you discuss any questions you have with your health care provider.   Document Released: 04/19/2004 Document Revised: 04/02/2014 Document Reviewed: 08/20/2013 Elsevier Interactive Patient Education 2016 Elsevier Inc.  

## 2016-01-25 ENCOUNTER — Encounter: Payer: Self-pay | Admitting: Adult Health

## 2016-01-25 ENCOUNTER — Ambulatory Visit (INDEPENDENT_AMBULATORY_CARE_PROVIDER_SITE_OTHER): Payer: Commercial Managed Care - HMO | Admitting: Adult Health

## 2016-01-25 DIAGNOSIS — J441 Chronic obstructive pulmonary disease with (acute) exacerbation: Secondary | ICD-10-CM

## 2016-01-25 MED ORDER — BUDESONIDE-FORMOTEROL FUMARATE 160-4.5 MCG/ACT IN AERO: 2.0000 | INHALATION_SPRAY | Freq: Two times a day (BID) | RESPIRATORY_TRACT | 0 refills | Status: DC

## 2016-01-25 NOTE — Progress Notes (Addendum)
Subjective:     Patient ID: [name], male   DOB: [birth year]  MRN: [id number]     Brief patient profile:  81  yobm s/p smoking cessation in 1995 previously felt to have COPD  GOLD II criteria with reversibility 01/06/09    History of Present Illness  January 06, 2009 Followup with PFT's. Pt states that overall breathing has been okay. He gets SOB when he gets in a rush. [name] states that pt gets out of breath just walking from one room to the next. Also he c/o dry cough- especially at night x 4-6 weeks eval by [facility] with cxr and rx cough syrup > no better. no purulent sputum. Try higher strength of Symbicort 160 2 puffs first thing in am and 2 puffs again in pm about 12 hours later and if happy with it fill the prescription.  as long as you are coughing take the zegerid otc at bedtime > seemed to help to pt and wife's satisfaction.      09/01/2013 Acute OV  Complains of increased SOB especially w/ exertion, wheezing, some dry cough x2-3weeks. No fever or discolored mucus  Occurred while at [facility]. No leg swelling, calf pain or edema.  At rest no significant dyspnea. But cant walk 10 feet without severe dyspnea, cough and wheezing.  Today in office with desats in 70% with ambulation.  EKG shows ST depression .  O2 sats at rest 100% on RA  No chest pain, n/v/d, hemoptysis , fever.  Denies f/c/s, hemoptysis, nausea, vomiting. No calf pain rec admit  Admit date: 09/01/2013  Discharge date: 09/07/2013  Recommendations for Outpatient Follow-up:  1. Acute hypoxic respiratory failure, multifactorial, patient newly started on home oxygen 2 L per minute continuous 2. Symptomatic microcytic anemia, status post iron infusion, consider repeat CBC in the next few weeks to assess response to oral iron therapy. 3. Evidence of worsened systolic congestive heart failure currently compensated. Currently euvolemic without diuretics. 4. Resolution of COPD exacerbation and possible  community acquired pneumonia. 5. Metformin has been discontinued secondary to his heart failure, blood sugars have been well controlled during this hospitalization without significant use of insulin or other agents. For now suggest continue monitor blood sugars and hold medication  6.  Discharge Diagnoses:  1. Acute hypoxic respiratory failure 2. Symptomatic microcytic anemia secondary to Candida esophagitis 3. Possible acute on chronic systolic congestive heart failure 4. Candida esophagitis 5. COPD, possible exacerbation 6. Possible [facility] pneumonia 7. Diabetes mellitus type 2 currently diet controlled Discharge Condition: Improved  Disposition: Home with home health RN, PT  Diet recommendation: Heart healthy diabetic diet  Filed Weights    09/05/13 0400  09/06/13 0410  09/07/13 0403   Weight:  74.707 kg (164 lb 11.2 oz)  74.2 kg (163 lb 9.3 oz)  72.7 kg (160 lb 4.4 oz)   History of present illness:  81 year old man presented with progressive dyspnea on exertion for 3 weeks, asymptomatic at rest, hypoxic with ambulation his pulmonologist office. He was sent to the emergency department for further evaluation of hypoxia. Initial evaluation revealed acute respiratory failure with hypoxia, mild CHF , possible exertional angina.  Hospital Course:  [name] was admitted for further evaluation of respiratory failure with hypoxia, abnormal EKG and possible COPD. He was seen in consultation with cardiology, cardiac enzymes were negative and there were no signs or symptoms to suggest acute coronary syndrome. 2-D echocardiogram did reveal worsening of chronic systolic heart failure, cardiology recommended  medical management with no further intervention. His respiratory status has remained stable without evidence of volume overload without diuretic therapy. He is to have microcytic anemia and seen in consultation with gastroenterology, underwent EGD which revealed Candida esophagitis, see full  report below. He is also felt to have a COPD exacerbation and possible pneumonia. With treatment of these issues his condition is improved at the time of discharge she is quite stable but will her car home oxygen. Individual issues as below. Beta blocker was recommended for his heart disease, he appears to be tolerating this well from a pulmonary standpoint.  1. Acute hypoxic respiratory failure. No hypoxia except with ambulation. Multifactorial, favor progression of COPD, possible pneumonia, anemia, systolic heart failure. 2. Symptomatic anemia. EGD revealed Candida esophagitis, gastric ulceration, moderate erosive esophagitis. Anemia felt to be multifactorial but esophagitis likely contributing. Stable. 3. Suspect subacute systolic congestive heart failure on admission. Echocardiogram with significant decrease in left ventricular ejection fraction. Suspect several week history of dyspnea on exertion related to decreased heart function combined with anemia and COPD. Discussed in detail with the patient's cardiologist recommended starting beta blocker, digoxin 4. Candida esophagitis. 5. Elevated troponin on admission. Subsequent troponins negative. No evidence of ACS on admission. 6. COPD possible exacerbation. Appears resolved. 7. Possible Community acquired pneumonia. Appears resolved. Finish Levaquin. 8. Diabetes mellitus type 2. Well controlled. 9. Alzheimer's dementia. Stable, at baseline 10. Tobacco dependence. Recommend cessation. Plan discharge home today with home health RN, PT.  Complete steroid taper, antibiotics  Iron on discharge, will need 7 days of Nystatin. PPI BID for 3 months and then QD. Followup with GI as needed. Consultants:  Cardiology Procedures:  Transfusion one unit packed red blood cells  EGD ENDOSCOPIC IMPRESSION: 1. Candida Esophagitis  2. Moderate Erosive Esophagitis  3. Mild Antral Gastritis  4. Gastric Ulceration  5. Small Hiatal Hernia  6. Minimal Duodenitis   7. Anemia likely multifactorial but esophagitis likely contributing  to it  Antibiotics:  Levaquin 6/10 >> 6/17 Discharge Instructions  Discharge Instructions    Diet - low sodium heart healthy  Complete by: As directed       Diet Carb Modified  Complete by: As directed       Discharge instructions  Complete by: As directed    Call physician or seek immediate medical attention for increased shortness of breath, chest pain, bleeding or worsening in condition. Continue to check blood sugars every day. Metformin has been discontinued because of your heart weakness. However your blood sugars have been well controlled during her hospital stay. Call your physician for blood sugars greater than 400 or less than 70.     Increase activity slowly  Complete by: As directed              Medication List     STOP taking these medications       clopidogrel 75 MG tablet    Commonly known as: PLAVIX    metFORMIN 500 MG tablet    Commonly known as: GLUCOPHAGE     TAKE these medications       albuterol (2.5 MG/3ML) 0.083% nebulizer solution    Commonly known as: PROVENTIL    Take 2.5 mg by nebulization every 6 (six) hours as needed for wheezing or shortness of breath.    aspirin 81 MG EC tablet    Take 1 tablet (81 mg total) by mouth daily.    budesonide-formoterol 160-4.5 MCG/ACT inhaler    Commonly known as: SYMBICORT  Inhale 2 puffs into the lungs 2 (two) times daily.    digoxin 0.125 MG tablet    Commonly known as: LANOXIN    Take 1 tablet (0.125 mg total) by mouth daily.    ferrous sulfate 325 (65 FE) MG EC tablet    Take 1 tablet (325 mg total) by mouth daily with breakfast.    levofloxacin 500 MG tablet    Commonly known as: LEVAQUIN    Take 1 tablet (500 mg total) by mouth daily. Start 6/16 in the morning.    metoprolol tartrate 25 MG tablet    Commonly known as: LOPRESSOR    Take 0.5 tablets (12.5 mg total) by mouth 2 (two) times daily.    nystatin 100000 UNIT/ML suspension     Commonly known as: MYCOSTATIN    Take 5 mLs (500,000 Units total) by mouth 4 (four) times daily.    olmesartan 40 MG tablet    Commonly known as: BENICAR    Take 40 mg by mouth daily.    pantoprazole 40 MG tablet    Commonly known as: PROTONIX    Take 1 tablet (40 mg total) by mouth 2 (two) times daily.    predniSONE 10 MG tablet    Commonly known as: DELTASONE    Start 6/16 in AM. Take 20 mg by mouth daily for 3 days. Then take 10 mg by mouth daily for 3 days. Then stop.    rosuvastatin 20 MG tablet    Commonly known as: CRESTOR    Take 20 mg by mouth at bedtime.    Vitamin D 2000 UNITS Caps    Take 1 capsule by mouth at bedtime.      09/23/2013 post f/u ov/extensive ov at transition of care/Wert re: 02 24/7 at 2lpm Chief Complaint  Patient presents with  . HFU    Pt states breathing is some better, but not back to baseline. He c/o increased cough that started before he was d/c'ed- cough is non prod.   no worse since finished prednisone  Now on 02 continuously  @ 2lpm  Cough is mostly daytime, not assoc with meals Confused with meds rec Work on inhaler technique:     Only use your albuterol  as a rescue medication     06/04/2014 f/u ov/Wert re: COPD II    Chief Complaint  Patient presents with  . Acute Visit    Pt c/o increased SOB for the past 2 days.  He is SOB with exertion and spouse states he is gasping for air in his sleep.  He also c/o cough more than usual- occ prod with minimal clear sputum.    maint rx = symbicort 160 2bid   Has nebulizer but rarely uses >>pred taper and start on tudorza   12/22/2014 NP Follow up : Rhaya Coale /COPD GOLD II  Pt returns for 6 month follow up .  He is accompanied by his wife.  Pt has moderate to severe dementia.  Wife answers most of his questions , she is his caregiver.  We reviewed his meds and organized them into a med calendar . Wife says he has hit the doughnut hole and it is hard to afford his inhalers. We discussed changing to  Nebs as it is also hard to coordinate MDI use .  He is taking lisinopril and divoan, it appears to have been prescribed by two different providers. Suggested he stop ACE Inhibitor. Continue on Diovan and discuss regimen with PCP .  Overall says his breathing has been good with no flare of cough or dyspnea. No ER /hosp visits since last ov .  No fever, chest pain, orthopnea or edema.  She was somewhat upset because she has a lot of responsibiltiy with his care and they missed an appointment here because he took the phone call for the ov and forgot to tell her. .  Support provided. Finances are also tough.  Needs flu shot today . Prevnar and Pneumovax utd.  According to review of hospital records he was admitted in 11/2013 with PNA and DVT . Tx w/ Xarelto ? For 6 months as he is no longer taking.      06/24/2015 NP Follow up : COPD GOLD II  Pt returns for 6 months follow up .  Pt states his breathing and BP is doing well and has no new complaints at this time.  Doing great for last 6 months, no flare of cough or wheezing.  Remains on Symbicort . Assistance through the company . Needs to reapply for assistance .  Lisinopril stopped last ov , cough and runny nose are much better.  Wife is pt cargiver as he has moderate to advanced dementia.  rec Continue on Symbicort Twice daily  , rinse after use.        12/28/2015  f/u ov/Wert re: COPD GOLD II  On symbicort but hfa questionable, using neb more than baseline/ no med calendar  Chief Complaint  Patient presents with  . Follow-up    Breathing is doing well. He wheezes at night sometimes. He never uses albuterol inhaler, but has recently starting using neb 2 x daily.    Not limited by breathing from desired activities  But very sedentary and becoming much more forgetful  >>pred taper   01/25/2016 Follow up : COPD GOLD II  Patient presents for a one-month follow-up, along with his wife. Last visit was having a mild flare COPD, was given a  prednisone taper with improvement in his symptoms. Patient cough has decreased. Wife says breathing has returned back to his baseline. Patient has moderate to severe dementia. We reviewed all his medications organize them into a medication count with patient education Appears be taking correctly. Wife helps him with his medications. Denies any chest pain, orthopnea, PND, or increased leg swelling Current Medications, Allergies, Complete Past Medical History, Past Surgical History, Family History, and Social History were reviewed in [employer] record.  ROS  The following are not active complaints unless bolded sore throat, dysphagia, dental problems, itching, sneezing,  nasal congestion or excess/ purulent secretions, ear ache,   fever, chills, sweats, unintended wt loss, classically pleuritic or exertional cp,  orthopnea pnd or leg swelling, presyncope, palpitations, abdominal pain, anorexia, nausea, vomiting, diarrhea  or change in bowel or bladder habits, change in stools or urine, dysuria,hematuria,  rash, arthralgias, visual complaints, headache, numbness, weakness or ataxia or problems with walking or coordination,  change in mood/affect or+ dementia                     Past Medical History:  HYPERLIPIDEMIA (ICD-272.4)  PVD (ICD-443.9)  HYPERTENSION NEC  DYSPNEA (ICD-786.05)  COPD  PFT's 11/18/00 FEV1 is 1.94 or 60%  - PFT's 01/06/09 FEV1 1.75 (57%) ratio 49 and 21 % better FVC after B2 DLC0 71  - HFA 90% better p coaching 01/23/10  -med calendar 12/22/2014 . 01/25/2016  Objective:   Physical Exam   elderly black male ambulatory     Vitals signs reviewed  - sats 99% on arrival RA  Wt  187 January 06, 2009 > 186 01/23/10 > 05/04/2011  191 > 06/16/2013  170 >169 09/01/2013 >   169 09/23/2013 > 06/04/2014  181 >171 12/22/2014 > 12/28/2015  168   HEENT mild turbinate edema. Edentulous,  Oropharynx no thrush or excess pnd or cobblestoning. No JVD or  cervical adenopathy. Mild accessory muscle hypertrophy. Trachea midline, nl thryroid. Chest was hyperinflated by percussion with diminished breath sounds and decreased BS in bases .    Regular rate and rhythm without murmur gallop or rub or increase P2. No edema , neg homans sign Abd: no hsm, nl excursion. Ext warm without cyanosis or clubbing. Pleasant but  Confused/ doesn't know president or any recent national events              Kamerin Grumbine NP-C  [facility]  01/25/2016

## 2016-01-25 NOTE — Progress Notes (Signed)
Chart and office note reviewed in detail  > agree with a/p as outlined    

## 2016-01-25 NOTE — Patient Instructions (Signed)
Continue on Symbicort Twice daily  , rinse after use.  Follow med calendar closely and bring to each visit  Follow up in office in 6 months and As needed

## 2016-01-25 NOTE — Assessment & Plan Note (Signed)
Recent exacerbation, now resolved  Patient Instructions  Continue on Symbicort Twice daily  , rinse after use.  Follow med calendar closely and bring to each visit  Follow up in office in 6 months and As needed

## 2016-01-25 NOTE — Addendum Note (Signed)
Addended by: Benson Setting L on: 01/25/2016 02:54 PM   Modules accepted: Orders

## 2016-01-31 ENCOUNTER — Ambulatory Visit (HOSPITAL_COMMUNITY)
Admission: RE | Admit: 2016-01-31 | Discharge: 2016-02-01 | Disposition: A | Discharge status: Home / Self Care | Payer: Commercial Managed Care - HMO | Source: Ambulatory Visit | Attending: Surgery | Admitting: Surgery

## 2016-01-31 ENCOUNTER — Encounter (HOSPITAL_COMMUNITY)
Admission: RE | Disposition: A | Discharge status: Home / Self Care | Payer: Self-pay | Source: Ambulatory Visit | Attending: Surgery

## 2016-01-31 ENCOUNTER — Other Ambulatory Visit: Payer: Self-pay | Admitting: *Deleted

## 2016-01-31 ENCOUNTER — Encounter (HOSPITAL_COMMUNITY): Payer: Self-pay | Admitting: *Deleted

## 2016-01-31 DIAGNOSIS — D649 Anemia, unspecified: Secondary | ICD-10-CM | POA: Insufficient documentation

## 2016-01-31 DIAGNOSIS — Z8249 Family history of ischemic heart disease and other diseases of the circulatory system: Secondary | ICD-10-CM | POA: Diagnosis not present

## 2016-01-31 DIAGNOSIS — I509 Heart failure, unspecified: Secondary | ICD-10-CM | POA: Insufficient documentation

## 2016-01-31 DIAGNOSIS — E785 Hyperlipidemia, unspecified: Secondary | ICD-10-CM | POA: Insufficient documentation

## 2016-01-31 DIAGNOSIS — Z7982 Long term (current) use of aspirin: Secondary | ICD-10-CM | POA: Insufficient documentation

## 2016-01-31 DIAGNOSIS — G309 Alzheimer's disease, unspecified: Secondary | ICD-10-CM | POA: Diagnosis not present

## 2016-01-31 DIAGNOSIS — Z85038 Personal history of other malignant neoplasm of large intestine: Secondary | ICD-10-CM | POA: Diagnosis not present

## 2016-01-31 DIAGNOSIS — Z87891 Personal history of nicotine dependence: Secondary | ICD-10-CM | POA: Insufficient documentation

## 2016-01-31 DIAGNOSIS — Z9862 Peripheral vascular angioplasty status: Secondary | ICD-10-CM

## 2016-01-31 DIAGNOSIS — I700 Atherosclerosis of aorta: Secondary | ICD-10-CM | POA: Insufficient documentation

## 2016-01-31 DIAGNOSIS — I739 Peripheral vascular disease, unspecified: Secondary | ICD-10-CM | POA: Diagnosis present

## 2016-01-31 DIAGNOSIS — I110 Hypertensive heart disease with heart failure: Secondary | ICD-10-CM | POA: Insufficient documentation

## 2016-01-31 DIAGNOSIS — J449 Chronic obstructive pulmonary disease, unspecified: Secondary | ICD-10-CM | POA: Insufficient documentation

## 2016-01-31 DIAGNOSIS — Z823 Family history of stroke: Secondary | ICD-10-CM | POA: Diagnosis not present

## 2016-01-31 DIAGNOSIS — F0280 Dementia in other diseases classified elsewhere without behavioral disturbance: Secondary | ICD-10-CM | POA: Diagnosis not present

## 2016-01-31 DIAGNOSIS — I70213 Atherosclerosis of native arteries of extremities with intermittent claudication, bilateral legs: Secondary | ICD-10-CM

## 2016-01-31 HISTORY — PX: PERIPHERAL VASCULAR CATHETERIZATION: SHX172C

## 2016-01-31 LAB — POCT ACTIVATED CLOTTING TIME
ACTIVATED CLOTTING TIME: 191 s
Activated Clotting Time: 241 seconds
Activated Clotting Time: 175 seconds

## 2016-01-31 LAB — POCT I-STAT, CHEM 8
BUN: 24 mg/dL — ABNORMAL HIGH (ref 6–20)
Calcium, Ion: 1.21 mmol/L (ref 1.15–1.40)
Chloride: 103 mmol/L (ref 101–111)
Creatinine, Ser: 1 mg/dL (ref 0.61–1.24)
Glucose, Bld: 138 mg/dL — ABNORMAL HIGH (ref 65–99)
HEMATOCRIT: 45 % (ref 39.0–52.0)
HEMOGLOBIN: 15.3 g/dL (ref 13.0–17.0)
POTASSIUM: 4.6 mmol/L (ref 3.5–5.1)
SODIUM: 141 mmol/L (ref 135–145)
TCO2: 28 mmol/L (ref 0–100)

## 2016-01-31 LAB — GLUCOSE, CAPILLARY: GLUCOSE-CAPILLARY: 113 mg/dL — AB (ref 65–99)

## 2016-01-31 SURGERY — ABDOMINAL AORTOGRAM W/LOWER EXTREMITY
Laterality: Right

## 2016-01-31 MED ORDER — PHENOL 1.4 % MT LIQD: 1.0000 | OROMUCOSAL | Status: DC | PRN

## 2016-01-31 MED ORDER — NITROGLYCERIN 1 MG/10 ML FOR IR/CATH LAB
INTRA_ARTERIAL | Status: DC | PRN
Start: 2016-01-31 — End: 2016-01-31
  Administered 2016-01-31: 200 ug via INTRA_ARTERIAL

## 2016-01-31 MED ORDER — LABETALOL HCL 5 MG/ML IV SOLN
INTRAVENOUS | Status: AC
  Filled 2016-01-31: qty 4

## 2016-01-31 MED ORDER — CLOPIDOGREL BISULFATE 75 MG PO TABS
75.0000 mg | ORAL_TABLET | Freq: Every day | ORAL | Status: DC
  Administered 2016-02-01: 75 mg via ORAL
  Filled 2016-01-31: qty 1

## 2016-01-31 MED ORDER — MIDAZOLAM HCL 2 MG/2ML IJ SOLN
INTRAMUSCULAR | Status: AC
  Filled 2016-01-31: qty 2

## 2016-01-31 MED ORDER — ALBUTEROL SULFATE (2.5 MG/3ML) 0.083% IN NEBU: 3.0000 mL | INHALATION_SOLUTION | Freq: Four times a day (QID) | RESPIRATORY_TRACT | Status: DC | PRN

## 2016-01-31 MED ORDER — MORPHINE SULFATE (PF) 2 MG/ML IV SOLN: 2.0000 mg | INTRAVENOUS | Status: DC | PRN

## 2016-01-31 MED ORDER — FENTANYL CITRATE (PF) 100 MCG/2ML IJ SOLN
INTRAMUSCULAR | Status: AC
  Filled 2016-01-31: qty 2

## 2016-01-31 MED ORDER — HEPARIN (PORCINE) IN NACL 2-0.9 UNIT/ML-% IJ SOLN
INTRAMUSCULAR | Status: DC | PRN
  Administered 2016-01-31: 1000 mL

## 2016-01-31 MED ORDER — HEPARIN (PORCINE) IN NACL 2-0.9 UNIT/ML-% IJ SOLN
INTRAMUSCULAR | Status: AC
  Filled 2016-01-31: qty 1000

## 2016-01-31 MED ORDER — ALUM & MAG HYDROXIDE-SIMETH 200-200-20 MG/5ML PO SUSP: 15.0000 mL | ORAL | Status: DC | PRN

## 2016-01-31 MED ORDER — NITROGLYCERIN 1 MG/10 ML FOR IR/CATH LAB
INTRA_ARTERIAL | Status: AC
  Filled 2016-01-31: qty 10

## 2016-01-31 MED ORDER — VERAPAMIL HCL 2.5 MG/ML IV SOLN
INTRAVENOUS | Status: AC
  Filled 2016-01-31: qty 2

## 2016-01-31 MED ORDER — CLOPIDOGREL BISULFATE 75 MG PO TABS: 75.0000 mg | ORAL_TABLET | Freq: Every day | ORAL | 11 refills | Status: DC

## 2016-01-31 MED ORDER — LIDOCAINE HCL (PF) 1 % IJ SOLN
INTRAMUSCULAR | Status: AC
  Filled 2016-01-31: qty 30

## 2016-01-31 MED ORDER — GUAIFENESIN-DM 100-10 MG/5ML PO SYRP: 15.0000 mL | ORAL_SOLUTION | ORAL | Status: DC | PRN

## 2016-01-31 MED ORDER — HEPARIN SODIUM (PORCINE) 1000 UNIT/ML IJ SOLN
INTRAMUSCULAR | Status: DC | PRN
  Administered 2016-01-31: 6000 [IU] via INTRAVENOUS
  Administered 2016-01-31: 2000 [IU] via INTRAVENOUS

## 2016-01-31 MED ORDER — DOCUSATE SODIUM 100 MG PO CAPS: 100.0000 mg | ORAL_CAPSULE | Freq: Every day | ORAL | Status: DC

## 2016-01-31 MED ORDER — MORPHINE SULFATE (PF) 4 MG/ML IV SOLN
INTRAVENOUS | Status: AC
  Filled 2016-01-31: qty 1

## 2016-01-31 MED ORDER — LIDOCAINE HCL (PF) 1 % IJ SOLN
INTRAMUSCULAR | Status: DC | PRN
  Administered 2016-01-31: 2 mL

## 2016-01-31 MED ORDER — OXYCODONE HCL 5 MG PO TABS: 5.0000 mg | ORAL_TABLET | ORAL | Status: DC | PRN

## 2016-01-31 MED ORDER — HYDRALAZINE HCL 20 MG/ML IJ SOLN
5.0000 mg | INTRAMUSCULAR | Status: DC | PRN
  Administered 2016-02-01: 5 mg via INTRAVENOUS
  Filled 2016-01-31: qty 1

## 2016-01-31 MED ORDER — ONDANSETRON HCL 4 MG/2ML IJ SOLN: 4.0000 mg | Freq: Four times a day (QID) | INTRAMUSCULAR | Status: DC | PRN

## 2016-01-31 MED ORDER — SODIUM CHLORIDE 0.9 % IV SOLN
1.0000 mL/kg/h | INTRAVENOUS | Status: DC
  Administered 2016-01-31: 1 mL/kg/h via INTRAVENOUS

## 2016-01-31 MED ORDER — HEPARIN SODIUM (PORCINE) 1000 UNIT/ML IJ SOLN
INTRAMUSCULAR | Status: AC
  Filled 2016-01-31: qty 1

## 2016-01-31 MED ORDER — VIPERSLIDE LUBRICANT OPTIME
TOPICAL | Status: DC | PRN
  Administered 2016-01-31: 14:00:00 via SURGICAL_CAVITY

## 2016-01-31 MED ORDER — METOPROLOL TARTRATE 5 MG/5ML IV SOLN: 2.0000 mg | INTRAVENOUS | Status: DC | PRN

## 2016-01-31 MED ORDER — LABETALOL HCL 5 MG/ML IV SOLN
10.0000 mg | INTRAVENOUS | Status: AC | PRN
  Administered 2016-01-31 (×4): 10 mg via INTRAVENOUS
  Filled 2016-01-31: qty 4

## 2016-01-31 MED ORDER — MORPHINE SULFATE (PF) 10 MG/ML IV SOLN
2.0000 mg | INTRAVENOUS | Status: DC | PRN
Start: 2016-01-31 — End: 2016-01-31
  Administered 2016-01-31: 2 mg via INTRAVENOUS

## 2016-01-31 MED ORDER — NITROGLYCERIN IN D5W 200-5 MCG/ML-% IV SOLN
INTRAVENOUS | Status: AC
  Filled 2016-01-31: qty 250

## 2016-01-31 MED ORDER — SODIUM CHLORIDE 0.9 % IV SOLN: INTRAVENOUS | Status: DC

## 2016-01-31 SURGICAL SUPPLY — 28 items
BALLN IN.PACT DCB 7X40 (BALLOONS) ×4
BALLN LUTONIX DCB 4X60X130 (BALLOONS) ×4
BALLN MUSTANG 7.0X40 135 (BALLOONS) ×3
BALLN MUSTANG 7.0X40 135CM (BALLOONS) ×1
BALLOON LUTONIX DCB 4X60X130 (BALLOONS) ×2 IMPLANT
BALLOON MUSTANG 7.0X40 135 (BALLOONS) ×2 IMPLANT
CATH ANGIO 5F PIGTAIL 100CM (CATHETERS) ×4 IMPLANT
CATH HEADHUNTER 5F 125CM (CATHETERS) ×4 IMPLANT
CATH SOS OMNI O 5F 80CM (CATHETERS) ×4 IMPLANT
DCB IN.PACT 7X40 (BALLOONS) ×2 IMPLANT
DIAMONDBACK CLASSIC OAS 2.0MM (CATHETERS) ×4
KIT ENCORE 26 ADVANTAGE (KITS) ×4 IMPLANT
KIT MICROINTRODUCER STIFF 5F (SHEATH) ×12 IMPLANT
KIT PV (KITS) IMPLANT
LUBRICANT VIPERSLIDE CORONARY (MISCELLANEOUS) ×4 IMPLANT
SHEATH BRITE TIP 6FR 90CM (SHEATH) ×4 IMPLANT
SHEATH PINNACLE 5F 10CM (SHEATH) ×4 IMPLANT
SHEATH PINNACLE 6F 10CM (SHEATH) ×4 IMPLANT
STENT INNOVA 8X40X130 (Permanent Stent) ×4 IMPLANT
SYR MEDRAD MARK V 150ML (SYRINGE) ×4 IMPLANT
SYSTEM DIMODBCK CLSC OAS 2.0MM (CATHETERS) ×2 IMPLANT
TAPE RADIOPAQUE TURBO (MISCELLANEOUS) ×4 IMPLANT
TRANSDUCER W/STOPCOCK (MISCELLANEOUS) IMPLANT
TRAY PV CATH (CUSTOM PROCEDURE TRAY) ×4 IMPLANT
TUBING HIGH PRESSURE 120CM (CONNECTOR) ×4 IMPLANT
WIRE BENTSON .035X145CM (WIRE) ×4 IMPLANT
WIRE HI TORQ VERSACORE J 260CM (WIRE) ×4 IMPLANT
WIRE VIPER ADVANCE .017X335CM (WIRE) ×4 IMPLANT

## 2016-01-31 NOTE — Progress Notes (Signed)
Site area: Right brachial arterial 6 french sheath was removed by Lurene Shadow [facility]  Site Prior to Removal:  Level 0  Pressure Applied For 35 MINUTES    Bedrest Beginning at 1740p  Manual:   Yes.    Patient Status During Pull:  stable  Post Pull Groin Site:  Level 0  Post Pull Instructions Given:  Yes.    Post Pull Pulses Present:  Yes.    Dressing Applied:  Yes.  Arm board applied for support  Comments: BP med given and BP 178/76 from 213/78

## 2016-01-31 NOTE — Op Note (Signed)
Patient name: [name] MRN: [id number] DOB: 1934/10/14 Sex: male  01/31/2016 Pre-operative Diagnosis: bilateral claudication Post-operative diagnosis:  Same Surgeon:  [name] Procedure Performed:  1.  Ultrasound-guided access, left brachial artery  2.  Abdominal aortogram  3.  Bilateral lower extremity runoff  4.  Atherectomy with drug coated balloon angioplasty, right common iliac artery  5.  Atherectomy with drug coated balloon angioplasty, right external iliac artery  6.  Stent, right common iliac artery  7.  Stent, right external iliac artery  8.  Conscious sedation: (82 minutes)   Indications:  The patient has a history of multiple lower extremity revascularization procedures including a right to left femoral-femoral bypass graft, bilateral femoral-popliteal bypass grafts, and a right common iliac stent.  He is having worsening claudication symptoms and his ankle-brachial indices have decreased.  He comes in today for further evaluation and possible intervention.  Procedure:  The patient was identified in the holding area and taken to room 8.  The patient was then placed supine on the table and prepped and draped in the usual sterile fashion.  A time out was called.  Conscious sedation was performed with the use of IV fentanyl and Versed in a continuous physician and nurse monitoring.  Heart rate blood pressure and oxygen saturations were continuously monitored.  Ultrasound was used to evaluate the right brachial artery.  It was patent .  A digital ultrasound image was acquired.  A micropuncture needle was used to access the right brachial under ultrasound guidance.  An 018 wire was advanced without resistance and a micropuncture sheath was placed.  The 018 wire was removed and a benson wire was placed.  The micropuncture sheath was exchanged for a 5 french sheath.  200 mg of nitroglycerin into thousand units of heparin were advanced through the sheath.  A SOS catheter and  Bentson wire were used to cannulate the descending thoracic aorta.  Next a pigtail catheter was positioned at the level of L1 and an abdominal aortogram was performed.  Next the catheter was advanced to the aortic bifurcation and pelvic angiography was performed followed by bilateral lower extremity runoff. Findings:   Aortogram:  No significant renal artery stenosis was identified.  The infrarenal abdominal aorta is calcified but patent throughout it's course without hematuria significant stenosis.  Left iliac artery is occluded.  There is a stent within the right common iliac artery which is widely patent.  The right hypogastric artery is occluded.  At the common and external iliac junction, there is a hemodynamically significant lesion with approximate 70% stenosis.  The remaining portion of the external iliac artery is patent throughout it's course.  A right to left femoral-femoral bypass graft is visualized and is widely patent.  Right Lower Extremity:  The right to left femoral-femoral bypass graft is widely patent.  The right profunda and common femoral artery are patent throughout the course.  There is a femoral-popliteal bypass graft to the above-knee popliteal artery which is widely patent without anastomotic narrowing.  The below knee popliteal artery is patent throughout it's course.  Tibial vessel evaluation is limited secondary to patient movement  Left Lower Extremity:  Left common femoral and profunda femoral artery patent throughout their course.  The femoral-femoral bypass graft anastomosis is widely patent.  Superficial femoral artery is occluded with reconstitution of the above-knee popliteal artery which is diseased.  The below-knee popliteal artery is patent throughout it's course.  Limited evaluation of the runoff vessels is obtained  secondary to patient movement.  Intervention:  After the above images were acquired the decision was made to proceed with intervention.  A 6 French 90 cm  sheath was advanced down into the distal abdominal aorta.  The patient was given an additional  6000 units of heparin.  A woolly wire and a headhunter catheter were used to gain wire access into the external iliac artery.  A wire exchange was performed and a Viper wire was placed.  I then used the CSI rotational atherectomy device to perform atherectomy of the right common and external iliac arteries.  This was done at low medium and high beads using a 2.0 CSI classic device.  2 passes at high-speed were performed.  I then proceeded to perform drug coated balloon angioplasty.  I initially selected a 7 x 40 Medtronic balloon however this would not go through the 6 French sheath and therefore I utilized a 6 x 40 Lutonix.  I had difficulty getting the balloon past the right common iliac stent and therefore the balloon was removed and exchanged the wire to an 035 wire and then I could get the drug coated balloon down to the lesion.  It was taken up to nominal pressure for 2 minutes with the balloon in the right common and external iliac artery..  Completion imaging revealed improved opacification of the area with stenosis of approximately 20%.  For that reason I elected to stent this area.  A 8 x 40 ANOVA was selected.  This was advanced over the wire into the external and common iliac artery on the right and then deployed.  A 7 x 40 balloon was used to mold the stent and a completion imaging was performed which showed resolution of the stenosis which is now less than 10%.  At this point, the long sheath was exchanged out for a short 6 [place] sheath and the patient be taken the holding area for sheath pull a squamous profile corrects  Impression:  #1  successful atherectomy and drug coated balloon angioplasty of the right common and external iliac artery.  Residual narrowing was present and therefore stenting of the right common and external iliac artery was performed using an 8 x 40 ANOVA  #2  patent right to left  femoral-femoral bypass graft without anastomotic narrowing  #3  patent right femoral-popliteal bypass graft  #4  occluded left superficial femoral artery  #5  tibial vessels were not well evaluated secondary to patient movement   V. [name], M.D. [facility] Office: [phone] Pager:  [phone]

## 2016-01-31 NOTE — H&P (View-Only) (Signed)
[facility] [place]   CC: Follow up peripheral artery occlusive disease  History of Present Illness [name] is a 81 y.o. male patient of Dr. Kellie Simmering who is s/p right femoral-popliteal bypass grafting with Gore-Tex in February 2015 and his ulcerated toe successfully healed. He has a right to left femoral-femoral bypass and an occluded left femoral-popliteal above-knee Gore-Tex graft. He had a right common iliac artery stent placed on 04/20/13.  Pt last saw Dr. Kellie Simmering on 05/18/14. At that time Dr. Kellie Simmering did not think patient was having rest pain in the left foot to the degree that he would require attempt at a revascularization which has low chance of success. He would need below-knee femoral-popliteal bypass grafting with Gore-Tex and he has a failed graft on the left side. He currently has no nonhealing ulcers or limb threatening ischemia. Ulcerations in the right foot have healed. Dr. Kellie Simmering discussed this at length with patient's wife and she was in agreement. Dr. Kellie Simmering stressed the importance of avoiding pressure sores or ulcerations. Dr. Evelena Leyden 05/18/14 assessment note indicates that If the patient does develop a pressure sore or nonhealing ulcer in the left foot he is a candidate for attempt at femoral-popliteal grafting on the left.  He returns today with wife report that pt started having swelling and redness and warmth of right foot on 01/11/16, and the symptoms are a little worse when reported on 01/12/16.  Stated his feet are usually cold.  Reported that the right foot swelling involves the toes, foot, and slightly above the ankle.  Unable to determine if pt. has any increased pain, due to his dementia.  Denied any open sores.  Wife states that pt's shortness of breath limits his walking, he does not seem to elicit claudication symptoms, but wife states she is walking with him daily to get him to walk, she states he uses his walker to sit when he gets tired.    Wife states pt has Alzheimer's Disease which has worsened.   Dr. Einar Gip is pt's cardiologist, wife states pt has CHF, denies any known history of MI.  Wife also denies any known history of pt having a stroke or TIA.  Pt Diabetic: "prediabetic" Pt smoker: former smoker, quit in the 1990's  Pt meds include: Statin :Yes Betablocker: Yes ASA: Yes Other anticoagulants/antiplatelets: no      Past Medical History:  Diagnosis Date  . Alzheimer's dementia 2013  . Anemia    HX OF ANEMIA  . CHF (congestive heart failure) ([place])   . Colon cancer ([place])   . COPD (chronic obstructive pulmonary disease) ([place])   . Depression   . Dyspnea   . Hyperlipidemia   . Hypertension   . Pneumonia 2014  . PVD (peripheral vascular disease) ([place])   . Type II diabetes mellitus ([place])     Social History Social History  Substance Use Topics  . Smoking status: Former Smoker    Packs/day: 0.30    Years: 40.00    Types: Cigarettes    Quit date: 03/26/1993  . Smokeless tobacco: Never Used  . Alcohol use No    Family History Family History  Problem Relation Age of Onset  . Stroke Mother 54  . Hypertension Mother   . Cancer Sister   . Heart disease Sister   . Heart disease Brother     Heart Disease before age 51  . Heart attack Brother   . Cancer Sister   . Cancer Brother  Past Surgical History:  Procedure Laterality Date  . ABDOMINAL ANGIOGRAM N/A 04/20/2013   Procedure: ABDOMINAL ANGIOGRAM;  Surgeon: Angelia Mould, MD;  Location: [facility] CATH LAB;  Service: Cardiovascular;  Laterality: N/A;  . CATARACT EXTRACTION Right 02/2013  . CATARACT EXTRACTION W/ INTRAOCULAR LENS IMPLANT Left 01/2013  . COLON RESECTION  1997   /enc. notes 05/17/2004 (04/28/2013)  . COLON SURGERY  1996   cancer  . ESOPHAGOGASTRODUODENOSCOPY N/A 09/05/2013   Procedure: ESOPHAGOGASTRODUODENOSCOPY (EGD);  Surgeon: Lear Ng, MD;  Location: [facility] ENDOSCOPY;  Service: Endoscopy;  Laterality: N/A;  .  FEMORAL ARTERY - FEMORAL ARTERY BYPASS GRAFT     right to left/enc. notes 05/17/2004 (04/28/2013)  . FEMORAL BYPASS Right    /enc. notes 05/17/2004 (04/28/2013)  . FEMORAL-POPLITEAL BYPASS GRAFT Right 04/29/2013   Procedure:  FEMORAL-POPLITEAL ARTERY Bypass Graft with intraoperative ultrasound and arteriogarm;  Surgeon: Mal Misty, MD;  Location: [facility];  Service: Vascular;  Laterality: Right;  . FIBEROPTIC BRONCHOSCOPY     Yvette Rack. notes 03/16/2005 (04/28/2013)  . INTRAOPERATIVE ARTERIOGRAM Right 04/29/2013   Procedure: INTRA OPERATIVE ARTERIOGRAM;  Surgeon: Mal Misty, MD;  Location: [facility];  Service: Vascular;  Laterality: Right;  . LOWER EXTREMITY ANGIOGRAM  04/20/2013   Procedure: LOWER EXTREMITY ANGIOGRAM;  Surgeon: Angelia Mould, MD;  Location: [facility] CATH LAB;  Service: Cardiovascular;;  . LUMBAR [place] SURGERY  2005  . PERCUTANEOUS STENT INTERVENTION  04/20/2013   Procedure: PERCUTANEOUS STENT INTERVENTION;  Surgeon: Angelia Mould, MD;  Location: [facility] CATH LAB;  Service: Cardiovascular;;  right common iliac artery  . SPINE SURGERY    . VASCULAR SURGERY      No Known Allergies  Current Outpatient Prescriptions  Medication Sig Dispense Refill  . albuterol (PROVENTIL HFA;VENTOLIN HFA) 108 (90 BASE) MCG/ACT inhaler Inhale 2 puffs into the lungs every 6 (six) hours as needed for shortness of breath. 1 Inhaler 5  . albuterol (PROVENTIL) (5 MG/ML) 0.5% nebulizer solution Take 2.5 mg by nebulization every 6 (six) hours as needed for shortness of breath.    Marland Kitchen aspirin 81 MG tablet Take 81 mg by mouth every morning.     . budesonide-formoterol (SYMBICORT) 160-4.5 MCG/ACT inhaler Inhale 2 puffs into the lungs 2 (two) times daily. 1 Inhaler 0  . Cholecalciferol (VITAMIN D) 2000 UNITS CAPS Take 2,000 Units by mouth every morning. Take one tablet twice daily    . dextromethorphan-guaiFENesin ([place] DM) 30-600 MG 12hr tablet Reported on 03/17/2015    . digoxin (LANOXIN) 0.125 MG tablet Take  0.125 mg by mouth every morning.     . furosemide (LASIX) 20 MG tablet Take 1 tablet (20 mg total) by mouth daily. (Patient taking differently: Take 20 mg by mouth every morning. ) 5 tablet 0  . metoprolol tartrate (LOPRESSOR) 25 MG tablet Take 0.5 tablets (12.5 mg total) by mouth 2 (two) times daily. 30 tablet 0  . Multiple Vitamins-Minerals (CENTRUM SILVER PO) Take by mouth daily.    . pravastatin (PRAVACHOL) 40 MG tablet Take 40 mg by mouth at bedtime.     . predniSONE (DELTASONE) 10 MG tablet Take  4 each am x 2 days,   2 each am x 2 days,  1 each am x 2 days and stop 14 tablet 0  . TRADJENTA 5 MG TABS tablet Take 1 tablet by mouth daily.    . traZODone (DESYREL) 50 MG tablet Take 50 mg by mouth at bedtime as needed for sleep.  No current facility-administered medications for this visit.     ROS: See HPI for pertinent positives and negatives.   Physical Examination  Vitals:   01/16/16 1047 01/16/16 1051  BP: (!) 156/60 (!) 150/60  Pulse: 60   Resp: 20   Temp: 97.3 F (36.3 C)   TempSrc: Oral   SpO2: 99%   Weight: 166 lb (75.3 kg)   Height: 5\' 10"  (1.778 m)    Body mass index is 23.82 kg/m.  General: A&O to person only, WDWN. Gait: normal Eyes: PERRLA. Pulmonary: Respirations are non labored, no rales, but + rhonchi and wheezes in all posterior fields. + moist cough. Cardiac: regular rhythm, no detected murmur.     Carotid Bruits Right Left   Negative Negative  Aorta is not palpable. Radial pulses: are palpable   VASCULAR EXAM: Extremities without ischemic change, without gangrene; without open wounds. Fem to fem bypass graft has a palpable pulse. Dependent rubor in both feet that resolves with elevation of legs above his heart.      LE Pulses Right Left   FEMORAL faintly  palpable faintly palpable    POPLITEAL not palpable  not palpable   POSTERIOR TIBIAL not palpable  not palpable    DORSALIS PEDIS  ANTERIOR TIBIAL not palpable  not palpable    Abdomen: soft, NT, no palpable masses. Skin: no rashes, no ulcers. Musculoskeletal: no muscle wasting or atrophy. Neurologic: A&O X 2; Appropriate Affect ; SENSATION: normal; MOTOR FUNCTION: moving all extremities equally, motor strength 5/5 throughout. Speech is fluent/normal.  CN 2-12  intact.    ASSESSMENT: DEIONDRE CHORNEY is a 81 y.o. male who is s/p right femoral-popliteal bypass grafting with Gore-Tex in February 2015 and his ulcerated toe successfully healed. He has a right to left femoral-femoral bypass and an occluded left femoral-popliteal above-knee Gore-Tex graft. He had a right common iliac artery stent placed on 04/20/13.  He has Alzheimer's Disease, is oriented to person and place, he follows commands appropriately. His wife reports his history. His dyspnea seems to limit his walking, he has CHF and COPD. His wife has been increasing his walking by walking with him, he uses a walker to sit and rest as needed. He has no signs of ischemia in his legs or feet. Wife states pt has no history of falling.  DATA Today's right LE arterial duplex suggests a patent right leg bypass graft with no internal stenosis noted. Patent right femoral to femoral bypass graft with increased inflow velocity in the 50-99% range (379 cm/s today, was 161 cm/s on 07/05/15).  Unable to visualize the right common iliac artery stent due to patient's inability to cooperate and bowel gas.  Increase in fem-fem bypass graft inflow velocity since last exam on 07/05/15.  Bilateral ABI's have declined since 07/05/15, all monophasic waveforms except absent left PT. Right TBI is dampened, left is absent.  Right ABI: 0.50, was 0.72; left ABI: 0.32, was 0.45.  PLAN:  Based on the patient's  vascular studies and examination, and after discussing with Dr. Trula Slade, pt will be scheduled for arteriogram with bilateral run off, possible intervention, by Dr. Trula Slade on 01/24/16.  I discussed in depth with the patient the nature of atherosclerosis, and emphasized the importance of maximal medical management including strict control of blood pressure, blood glucose, and lipid levels, obtaining regular exercise, and continued cessation of smoking.  The patient is aware that without maximal medical management the underlying atherosclerotic disease process will progress, limiting the benefit of  any interventions.  The patient was given information about PAD including signs, symptoms, treatment, what symptoms should prompt the patient to seek immediate medical care, and risk reduction measures to take.  Clemon Chambers, RN, MSN, FNP-C [facility] Phone: [phone]  Clinic MD: Trula Slade  01/16/16 11:05 AM

## 2016-01-31 NOTE — Progress Notes (Addendum)
Pt pulled out his sheath on left upper arm and also took out dressing for aotrogram. Agricultural consultant and IV were called to assess site. Minimal bleeding and bleeding had stopped by the time I assessed pt. Capillary refill and pulse palpitate 2+. Pt in no distress. He's confused and pulled it out. Redressed arm and inserted new IV. Will monitor pt closely.

## 2016-01-31 NOTE — Progress Notes (Signed)
Patient arrived to 2W29 in no apparent distress.  Telemetry monitor applied and CCMD notified.  Patient oriented to room and unit to include call light and phone.  Will continue to monitor.

## 2016-01-31 NOTE — Interval H&P Note (Signed)
History and Physical Interval Note:  01/31/2016 11:36 AM  [name]  has presented today for surgery, with the diagnosis of right foot swelling - pad  The various methods of treatment have been discussed with the patient and family. After consideration of risks, benefits and other options for treatment, the patient has consented to  Procedure(s): Abdominal Aortogram w/Lower Extremity (N/A) as a surgical intervention .  The patient's history has been reviewed, patient examined, no change in status, stable for surgery.  I have reviewed the patient's chart and labs.  Questions were answered to the patient's satisfaction.     [name]

## 2016-02-01 ENCOUNTER — Encounter (HOSPITAL_COMMUNITY): Payer: Self-pay | Admitting: Surgery

## 2016-02-01 DIAGNOSIS — I70213 Atherosclerosis of native arteries of extremities with intermittent claudication, bilateral legs: Secondary | ICD-10-CM | POA: Diagnosis not present

## 2016-02-01 LAB — GLUCOSE, CAPILLARY: Glucose-Capillary: 137 mg/dL — ABNORMAL HIGH (ref 65–99)

## 2016-02-01 NOTE — Progress Notes (Signed)
[facility] [place]  Subjective  - Doing well no new complaints.   Objective (!) 147/57 61 97.8 F (36.6 C) (Oral) 16 100% No intake or output data in the 24 hours ending 02/01/16 0826  Palpable radial pulses equal, grip 5/5 and sensation intact.    Assessment/Planning: POD # 1  Impression:             #1  successful atherectomy and drug coated balloon angioplasty of the right common and external iliac artery.  Residual narrowing was present and therefore stenting of the right common and external iliac artery was performed using an 8 x 40 ANOVA             #2  patent right to left femoral-femoral bypass graft without anastomotic narrowing             #3  patent right femoral-popliteal bypass graft             #4  occluded left superficial femoral artery             #5  tibial vessels were not well evaluated secondary to patient movement  Stable disposition for dis F/U with NP in our office in 1 month with repeat studies.  Laurence Slate [facility] 02/01/2016 8:26 AM --  Laboratory Lab Results:  Recent Labs  01/31/16 1106  HGB 15.3  HCT 45.0   BMET  Recent Labs  01/31/16 1106  NA 141  K 4.6  CL 103  GLUCOSE 138*  BUN 24*  CREATININE 1.00    COAG Lab Results  Component Value Date   INR 1.00 04/28/2013   INR 0.90 04/17/2013   INR 0.93 07/04/2012   No results found for: PTT

## 2016-02-01 NOTE — Progress Notes (Addendum)
Pt pulled out dressing for aortogram again. No bleeding noted. Redress site. Educated pt not to take off dressing again. Pt also pulled out arm board and will not keep it on.  Pt pulled out condom cath. Pt in no distress. Will monitor closely.

## 2016-02-02 ENCOUNTER — Encounter (HOSPITAL_COMMUNITY): Payer: Self-pay | Admitting: Surgery

## 2016-02-02 MED FILL — Morphine Sulfate Inj 4 MG/ML: INTRAMUSCULAR | Qty: 1 | Status: AC

## 2016-02-02 MED FILL — Midazolam HCl Inj 2 MG/2ML (Base Equivalent): INTRAMUSCULAR | Qty: 2 | Status: AC

## 2016-02-02 MED FILL — Fentanyl Citrate Preservative Free (PF) Inj 100 MCG/2ML: INTRAMUSCULAR | Qty: 2 | Status: AC

## 2016-02-07 NOTE — Addendum Note (Signed)
Addended by: Doroteo Glassman D on: 02/07/2016 09:28 AM   Modules accepted: Orders

## 2016-03-08 ENCOUNTER — Encounter: Payer: Self-pay | Admitting: Family

## 2016-03-12 ENCOUNTER — Encounter (HOSPITAL_COMMUNITY): Payer: Commercial Managed Care - HMO

## 2016-03-14 ENCOUNTER — Ambulatory Visit (INDEPENDENT_AMBULATORY_CARE_PROVIDER_SITE_OTHER)
Admission: RE | Admit: 2016-03-14 | Discharge: 2016-03-14 | Disposition: A | Discharge status: Home / Self Care | Payer: Commercial Managed Care - HMO | Source: Ambulatory Visit | Attending: Family | Admitting: Family

## 2016-03-14 ENCOUNTER — Ambulatory Visit: Payer: Commercial Managed Care - HMO | Admitting: Family

## 2016-03-14 ENCOUNTER — Ambulatory Visit (HOSPITAL_COMMUNITY)
Admission: RE | Admit: 2016-03-14 | Discharge: 2016-03-14 | Disposition: A | Discharge status: Home / Self Care | Payer: Commercial Managed Care - HMO | Source: Ambulatory Visit | Attending: Family | Admitting: Family

## 2016-03-14 ENCOUNTER — Ambulatory Visit (INDEPENDENT_AMBULATORY_CARE_PROVIDER_SITE_OTHER): Payer: Self-pay | Admitting: Family

## 2016-03-14 ENCOUNTER — Encounter: Payer: Self-pay | Admitting: Family

## 2016-03-14 VITALS — BP 149/73 | HR 70 | Temp 96.9°F | Resp 20 | Ht 70.0 in | Wt 164.0 lb

## 2016-03-14 DIAGNOSIS — Z87891 Personal history of nicotine dependence: Secondary | ICD-10-CM

## 2016-03-14 DIAGNOSIS — I739 Peripheral vascular disease, unspecified: Secondary | ICD-10-CM | POA: Insufficient documentation

## 2016-03-14 DIAGNOSIS — Z9862 Peripheral vascular angioplasty status: Secondary | ICD-10-CM

## 2016-03-14 DIAGNOSIS — I779 Disorder of arteries and arterioles, unspecified: Secondary | ICD-10-CM

## 2016-03-14 DIAGNOSIS — Z86718 Personal history of other venous thrombosis and embolism: Secondary | ICD-10-CM

## 2016-03-14 DIAGNOSIS — Z95828 Presence of other vascular implants and grafts: Secondary | ICD-10-CM

## 2016-03-14 NOTE — Patient Instructions (Signed)

## 2016-03-14 NOTE — Progress Notes (Signed)
Postoperative Visit   History of Present Illness  [name] is a 81 y.o. male who is s/p ultrasound-guided access, left brachial artery, Abdominal aortogram, Bilateral lower extremity runoff, Atherectomy with drug coated balloon angioplasty, right common iliac artery, Atherectomy with drug coated balloon angioplasty, right external iliac artery, Stent, right common iliac artery, and Stent, right external iliac artery on 01-31-16 by Dr. Trula Slade for bilateral claudication.  The patient has a history of multiple lower extremity revascularization procedures including a right to left femoral-femoral bypass graft, bilateral femoral-popliteal bypass grafts, and a right common iliac stent.  He was having worsening claudication symptoms and his ankle-brachial indices had decreased.    Findings:              Aortogram:  No significant renal artery stenosis was identified.  The infrarenal abdominal aorta is calcified but patent throughout it's course without hematuria significant stenosis.  Left iliac artery is occluded.  There is a stent within the right common iliac artery which is widely patent.  The right hypogastric artery is occluded.  At the common and external iliac junction, there is a hemodynamically significant lesion with approximate 70% stenosis.  The remaining portion of the external iliac artery is patent throughout it's course.  A right to left femoral-femoral bypass graft is visualized and is widely patent.             Right Lower Extremity:  The right to left femoral-femoral bypass graft is widely patent.  The right profunda and common femoral artery are patent throughout the course.  There is a femoral-popliteal bypass graft to the above-knee popliteal artery which is widely patent without anastomotic narrowing.  The below knee popliteal artery is patent throughout it's course.  Tibial vessel evaluation is limited secondary to patient movement             Left Lower Extremity:  Left  common femoral and profunda femoral artery patent throughout their course.  The femoral-femoral bypass graft anastomosis is widely patent.  Superficial femoral artery is occluded with reconstitution of the above-knee popliteal artery which is diseased.  The below-knee popliteal artery is patent throughout it's course.  Limited evaluation of the runoff vessels is obtained secondary to patient movement.  Impression:             #1  successful atherectomy and drug coated balloon angioplasty of the right common and external iliac artery.  Residual narrowing was present and therefore stenting of the right common and external iliac artery was performed using an 8 x 40 ANOVA             #2  patent right to left femoral-femoral bypass graft without anastomotic narrowing             #3  patent right femoral-popliteal bypass graft             #4  occluded left superficial femoral artery             #5  tibial vessels were not well evaluated secondary to patient movement  Wife states that pt's shortness of breath limits his walking, he does not seem to elicit claudication symptoms, but wife states she is walking with him daily to get him to walk, she states he uses his walker to sit when he gets tired.  He is walking to the restroom about every hour to void, wife states she contacted pt's PCP who prescribed Zithromax for possible UTI.  Wife states pt  has Alzheimer's Disease which has worsened.   Dr. Einar Gip is pt's cardiologist, wife states pt has CHF, denies any known history of MI.  Wife also denies any known history of pt having a stroke or TIA.  Pt Diabetic: "prediabetic" Pt smoker: former smoker, quit in the 1990's  Pt meds include: Statin :Yes Betablocker: Yes ASA: Yes Other anticoagulants/antiplatelets: no   Past Medical History, Past Surgical History, Social History, Family History, Medications, Allergies, and Review of Systems are unchanged from previous evaluation on 01-31-16.  For  VQI Use Only  PRE-ADM LIVING: Home  AMB STATUS: Ambulatory  Physical Examination  Vitals:   03/14/16 1131 03/14/16 1136  BP: (!) 170/70 (!) 149/73  Pulse: 70   Resp: 20   Temp: (!) 96.9 F (36.1 C)   TempSrc: Oral   SpO2: 99%   Weight: 164 lb (74.4 kg)   Height: 5\' 10"  (1.778 m)    Body mass index is 23.53 kg/m.  PHYSICAL EXAMINATION: General: General: A&O to person only, WDWN. Gait: slow, deliberate, using walker Eyes: Pupils are equal Pulmonary: Respirations are slightly labored at rest, + moist cough, + rhonchi and rales, no wheezes, limited air movement in all fields. Cardiac: regular rhythm, no detected murmur.     Carotid Bruits Right Left   Negative Negative  Aorta is not palpable. Radial pulses: are palpable   VASCULAR EXAM: Extremities without ischemic change, without gangrene; without open wounds. Right foot is slightly ruddy. Fem to fem bypass graft is palpable.     LE Pulses Right Left   FEMORAL faintly palpable not palpable    POPLITEAL not palpable  not palpable   POSTERIOR TIBIAL not palpable  not palpable    DORSALIS PEDIS  ANTERIOR TIBIAL not palpable  not palpable    Abdomen: soft, NT, no palpable masses. Skin: no rashes, no ulcers. Musculoskeletal: no muscle wasting or atrophy. Neurologic: A&O X 1; Appropriate Affect ; SENSATION: normal; MOTOR FUNCTION: moving all extremities equally, motor strength 4/5 throughout. Speech is fluent/normal.  CN 2-12 intact.     Medical Decision Making  [name] is a 81 y.o. male who presents s/p ultrasound-guided access, left brachial artery, Abdominal aortogram, Bilateral lower extremity runoff, Atherectomy with drug coated balloon angioplasty, right common iliac  artery, Atherectomy with drug coated balloon angioplasty, right external iliac artery, Stent, right common iliac artery, and Stent, right external iliac artery on 01-31-16 by Dr. Eulis Foster for bilateral claudication.  The patient has a history of multiple lower extremity revascularization procedures including a right to left femoral-femoral bypass graft, bilateral femoral-popliteal bypass grafts, and a right common iliac stent.  He was having worsening claudication symptoms and his ankle-brachial indices had decreased.    DATA Minimal visualization of the abdominal vasculature due to overlying bowel gas. The right EIA demonstrates elevated velocities of 511 cm/s in the proximal segment. No flow was visualized in the left EIA. A brief survey of the fem-fem bypass graft demonstrates patency with very low flow velocities in the left side. There are no previous examinations available for comparison.  ABI: Right: 0.52 (0.50, 01-16-16), monophasic PT and DP, TBI: 0.29 Left: 0.35 (0.32) DP with monophasic waveforms, no PT signal, TBI: no discernible signal No change in ABI's compared to 01-16-16.   I advised pt's wife that pt needs to be seen by his PCP ASAP re his moist cough for the last 2 days, rhonchi in lungs.  I discussed with Dr. Bridgett Larsson pt's non invasive vascular lab results from  today, pt HPI, and physical exam results from today. Pt to return to see Dr. Trula Slade at the first availability to address drop in left ABI and 511 cm/s velocity at the right EIA.   Wife states pt's insurance informed them that they will not pay for the angiogram done in November, 2017.  I contacted the site manager to investigate this and contact pt wife.   Dayln Tugwell, Sharmon Leyden, RN, MSN, FNP-C [facility] [place] Office: [phone]  03/14/2016, 11:41 AM  Clinic MD: Bridgett Larsson

## 2016-03-15 ENCOUNTER — Encounter (HOSPITAL_COMMUNITY): Payer: Self-pay

## 2016-03-15 ENCOUNTER — Emergency Department (HOSPITAL_COMMUNITY)
Admission: EM | Admit: 2016-03-15 | Discharge: 2016-03-15 | Disposition: A | Discharge status: Home / Self Care | Payer: Commercial Managed Care - HMO | Attending: Emergency Medicine | Admitting: Emergency Medicine

## 2016-03-15 DIAGNOSIS — N3000 Acute cystitis without hematuria: Secondary | ICD-10-CM | POA: Diagnosis not present

## 2016-03-15 DIAGNOSIS — Z85038 Personal history of other malignant neoplasm of large intestine: Secondary | ICD-10-CM | POA: Diagnosis not present

## 2016-03-15 DIAGNOSIS — F0280 Dementia in other diseases classified elsewhere without behavioral disturbance: Secondary | ICD-10-CM | POA: Insufficient documentation

## 2016-03-15 DIAGNOSIS — M79672 Pain in left foot: Secondary | ICD-10-CM | POA: Diagnosis present

## 2016-03-15 DIAGNOSIS — I110 Hypertensive heart disease with heart failure: Secondary | ICD-10-CM | POA: Diagnosis not present

## 2016-03-15 DIAGNOSIS — I5022 Chronic systolic (congestive) heart failure: Secondary | ICD-10-CM | POA: Diagnosis not present

## 2016-03-15 DIAGNOSIS — J449 Chronic obstructive pulmonary disease, unspecified: Secondary | ICD-10-CM | POA: Insufficient documentation

## 2016-03-15 DIAGNOSIS — G308 Other Alzheimer's disease: Secondary | ICD-10-CM | POA: Diagnosis not present

## 2016-03-15 DIAGNOSIS — Z87891 Personal history of nicotine dependence: Secondary | ICD-10-CM | POA: Insufficient documentation

## 2016-03-15 DIAGNOSIS — Z7982 Long term (current) use of aspirin: Secondary | ICD-10-CM | POA: Insufficient documentation

## 2016-03-15 LAB — URINALYSIS, ROUTINE W REFLEX MICROSCOPIC
Bilirubin Urine: NEGATIVE
Glucose, UA: NEGATIVE mg/dL
KETONES UR: NEGATIVE mg/dL
Nitrite: NEGATIVE
PROTEIN: 100 mg/dL — AB
Specific Gravity, Urine: 1.018 (ref 1.005–1.030)
pH: 6 (ref 5.0–8.0)

## 2016-03-15 MED ORDER — LEVOFLOXACIN 750 MG PO TABS: 750.0000 mg | ORAL_TABLET | Freq: Every day | ORAL | 0 refills | Status: DC

## 2016-03-15 MED ORDER — LEVOFLOXACIN 750 MG PO TABS
750.0000 mg | ORAL_TABLET | Freq: Every day | ORAL | Status: DC
  Administered 2016-03-15: 750 mg via ORAL
  Filled 2016-03-15: qty 1

## 2016-03-15 NOTE — ED Triage Notes (Signed)
Patient via [facility] c//o left foot pain with ambulation. Patient recently diagnosed with PVD. Patient is a poor historian d/t history of dementia

## 2016-03-15 NOTE — ED Notes (Signed)
Pt. Ambulated down the hall and back to his room without difficulty. Pt. Gait steady on his feet. 

## 2016-03-15 NOTE — ED Notes (Signed)
Bed: [id number] Expected date:  Expected time:  Means of arrival:  Comments: EMS 81 yo male left foot pain-PVD

## 2016-03-15 NOTE — Discharge Instructions (Signed)
Please read and follow all provided instructions.  Your diagnoses today include:  1. Acute cystitis without hematuria    Tests performed today include:  Vital signs. See below for your results today.   Urine test - shows urine infection  Urine culture - pending  Medications prescribed:   Levofloxacin - antibiotic  You have been prescribed an antibiotic medicine: take the entire course of medicine even if you are feeling better. Stopping early can cause the antibiotic not to work.  Take any prescribed medications only as directed.  Home care instructions:  Follow any educational materials contained in this packet.  BE VERY CAREFUL not to take multiple medicines containing Tylenol (also called acetaminophen). Doing so can lead to an overdose which can damage your liver and cause liver failure and possibly death.   Follow-up instructions: Please follow-up with your primary care provider in the next 3 days for further evaluation of your symptoms.   Return instructions:   Please return to the Emergency Department if you experience worsening symptoms.   Return with vomiting, fever, severe back pain.   Please return if you have any other emergent concerns.  Additional Information:  Your vital signs today were: BP 158/72 (BP Location: Left Arm)    Pulse 70    Temp 98.6 F (37 C) (Oral)    Resp 16    Ht 6' (1.829 m)    Wt 77.1 kg    SpO2 100%    BMI 23.06 kg/m  If your blood pressure (BP) was elevated above 135/85 this visit, please have this repeated by your doctor within one month. --------------

## 2016-03-15 NOTE — ED Provider Notes (Signed)
Medical screening examination/treatment/procedure(s) were conducted as a shared visit with non-physician practitioner(s) and myself.  I personally evaluated the patient during the encounter.   EKG Interpretation None       Patient seen by me along with physician assistant. Patient has significant dementia. Was brought in by EMS. Apparently  complaint for severe leg pain that he had at home that lasted for over an hour. Also now the wife has arrived she was concerned that he was unable to empty his bladder unable to PE. Upon arrival here patient had no complaints. Appeared to be in no distress. Patient known to have significant peripheral vascular disease. Just seen in vascular lab yesterday. Patient poor historian but was apparently complaining of left foot pain. Patient without any complaints now. Patient will reasonable capillary refill to both feet.   Patient was able to void here. Based on concern for for not being able to void will go and do a bladder scan.   If not showing any significant urinary retention patient can be discharged home. Continue follow-up with vascular surgery as scheduled.   Patient currently no acute distress. Possible that perhaps the complaint had something to do with a leg cramp. It seems to be no significant claudication or claudication at rest at this time.    Fredia Sorrow, MD 03/15/16 [phone]

## 2016-03-15 NOTE — ED Provider Notes (Signed)
[facility] DEPT Provider Note   CSN: [id number] Arrival date & time: 03/15/16  0631     History   Chief Complaint Chief Complaint  Patient presents with  . Foot Pain    HPI [name] is a 81 y.o. male.  Patient with history of peripheral vascular disease, status post femorofemoral bypass, bilateral femoropopliteal bypass, h/o dementia -- presents with c/o foot pain. Patient's wife states that he got up this AM and reported severe left foot pain for about an hour. EMS was called. Also states patient could not urinate.   Patient currently has no complaints or recollection of what happened. Level V caveat due to dementia.  Patient saw his vascular doctor yesterday. ABIs noted decreased in left foot.       Past Medical History:  Diagnosis Date  . Alzheimer's dementia 2013  . Anemia    HX OF ANEMIA  . CHF (congestive heart failure) ([place])   . Colon cancer ([employer])   . COPD (chronic obstructive pulmonary disease) ([place])   . Depression   . Dyspnea   . Hyperlipidemia   . Hypertension   . Pneumonia 2014  . PVD (peripheral vascular disease) ([place])   . Type II diabetes mellitus [facility])     Patient Active Problem List   Diagnosis Date Noted  . Diabetes mellitus ([place]) 12/25/2013  . Type I (juvenile type) diabetes mellitus with peripheral circulatory disorders, not stated as uncontrolled(250.71) 12/18/2013  . Acute DVT (deep venous thrombosis) ([place]) 12/12/2013  . PNA (pneumonia) 12/11/2013  . Candida esophagitis ([place]) 09/06/2013  . Chronic systolic CHF (congestive heart failure) ([place]) 09/06/2013  . CAP (community acquired pneumonia) 09/05/2013  . COPD exacerbation ([place]) 09/04/2013  . Microcytic anemia 09/03/2013  . Chronic respiratory failure with hypoxia ([place]) 09/01/2013  . Symptomatic anemia 09/01/2013  . DOE (dyspnea on exertion) 09/01/2013  . Atherosclerosis of native arteries of the extremities with ulceration ([place]) 06/24/2013  . PAD (peripheral artery disease)  ([place]) 04/28/2013  . Chest pain 03/23/2013  . Nausea with vomiting 03/23/2013  . Pain in joint, ankle and foot 03/23/2013  . Dementia 07/04/2012  . URI (upper respiratory infection) 07/04/2012  . PVD (peripheral vascular disease) ([place]) 04/21/2012  . Leg pain 04/18/2012  . COUGH 06/02/2007  . Hyperlipidemia 05/07/2007  . PVD 05/07/2007  . COPD COPD II  05/07/2007  . DYSPNEA 05/07/2007  . Essential hypertension 05/07/2007    Past Surgical History:  Procedure Laterality Date  . ABDOMINAL ANGIOGRAM N/A 04/20/2013   Procedure: ABDOMINAL ANGIOGRAM;  Surgeon: Angelia Mould, MD;  Location[facility] CATH LAB;  Service: Cardiovascular;  Laterality: N/A;  . CATARACT EXTRACTION Right 02/2013  . CATARACT EXTRACTION W/ INTRAOCULAR LENS IMPLANT Left 01/2013  . COLON RESECTION  1997   /enc. notes 05/17/2004 (04/28/2013)  . COLON SURGERY  1996   cancer  . ESOPHAGOGASTRODUODENOSCOPY N/A 09/05/2013   Procedure: ESOPHAGOGASTRODUODENOSCOPY (EGD);  Surgeon: Lear Ng, MD;  Location[facility] ENDOSCOPY;  Service: Endoscopy;  Laterality: N/A;  . FEMORAL ARTERY - FEMORAL ARTERY BYPASS GRAFT     right to left/enc. notes 05/17/2004 (04/28/2013)  . FEMORAL BYPASS Right    /enc. notes 05/17/2004 (04/28/2013)  . FEMORAL-POPLITEAL BYPASS GRAFT Right 04/29/2013   Procedure:  FEMORAL-POPLITEAL ARTERY Bypass Graft with intraoperative ultrasound and arteriogarm;  Surgeon: Mal Misty, MD;  Location: [facility];  Service: Vascular;  Laterality: Right;  . FIBEROPTIC BRONCHOSCOPY     Yvette Rack. notes 03/16/2005 (04/28/2013)  . INTRAOPERATIVE ARTERIOGRAM Right 04/29/2013   Procedure:  INTRA OPERATIVE ARTERIOGRAM;  Surgeon: Mal Misty, MD;  Location: [facility];  Service: Vascular;  Laterality: Right;  . LOWER EXTREMITY ANGIOGRAM  04/20/2013   Procedure: LOWER EXTREMITY ANGIOGRAM;  Surgeon: Angelia Mould, MD;  Location: [facility] CATH LAB;  Service: Cardiovascular;;  . LUMBAR [place] SURGERY  2005  . PERCUTANEOUS STENT INTERVENTION   04/20/2013   Procedure: PERCUTANEOUS STENT INTERVENTION;  Surgeon: Angelia Mould, MD;  Location: [facility] CATH LAB;  Service: Cardiovascular;;  right common iliac artery  . PERIPHERAL VASCULAR CATHETERIZATION N/A 01/31/2016   Procedure: Abdominal Aortogram w/Lower Extremity;  Surgeon: Serafina Mitchell, MD;  Location: [facility] CV LAB;  Service: Cardiovascular;  Laterality: N/A;  . PERIPHERAL VASCULAR CATHETERIZATION Right 01/31/2016   Procedure: Peripheral Vascular Atherectomy;  Surgeon: Serafina Mitchell, MD;  Location: [facility] CV LAB;  Service: Cardiovascular;  Laterality: Right;  Iliac common and external  . PERIPHERAL VASCULAR CATHETERIZATION Right 01/31/2016   Procedure: Peripheral Vascular Intervention;  Surgeon: Serafina Mitchell, MD;  Location: [facility] CV LAB;  Service: Cardiovascular;  Laterality: Right;  external iliac and common iliac  . SPINE SURGERY    . VASCULAR SURGERY         Home Medications    Prior to Admission medications   Medication Sig Start Date End Date Taking? Authorizing Provider  albuterol (PROVENTIL HFA;VENTOLIN HFA) 108 (90 BASE) MCG/ACT inhaler Inhale 2 puffs into the lungs every 6 (six) hours as needed for shortness of breath. 12/22/14   Tammy S Parrett, NP  albuterol (PROVENTIL) (2.5 MG/3ML) 0.083% nebulizer solution Inhale 3 mLs into the lungs every 6 (six) hours as needed for wheezing or shortness of breath.  10/14/15   Historical Provider, MD  aspirin 81 MG tablet Take 81 mg by mouth every morning.     Historical Provider, MD  budesonide-formoterol (SYMBICORT) 160-4.5 MCG/ACT inhaler Inhale 2 puffs into the lungs 2 (two) times daily. 01/25/16   Tammy S Parrett, NP  Cholecalciferol (VITAMIN D) 2000 UNITS CAPS Take 1,000 Units by mouth at bedtime.     Historical Provider, MD  clopidogrel (PLAVIX) 75 MG tablet Take 1 tablet (75 mg total) by mouth daily. 01/31/16   Serafina Mitchell, MD  dextromethorphan-guaiFENesin [facility] DM) 30-600 MG 12hr tablet Take 1  tablet by mouth 2 (two) times daily as needed for cough.    Historical Provider, MD  digoxin (LANOXIN) 0.125 MG tablet Take 0.125 mg by mouth every morning.     Historical Provider, MD  furosemide (LASIX) 20 MG tablet Take 1 tablet (20 mg total) by mouth daily. Patient taking differently: Take 20 mg by mouth every morning.  10/27/13   Tanna Furry, MD  metoprolol tartrate (LOPRESSOR) 25 MG tablet Take 0.5 tablets (12.5 mg total) by mouth 2 (two) times daily. 09/07/13   Samuella Cota, MD  Multiple Vitamins-Minerals (CENTRUM SILVER PO) Take 1 tablet by mouth daily.     Historical Provider, MD  pravastatin (PRAVACHOL) 40 MG tablet Take 40 mg by mouth every evening.     Historical Provider, MD  TRADJENTA 5 MG TABS tablet Take 1 tablet by mouth daily. 04/22/15   Historical Provider, MD  traZODone (DESYREL) 50 MG tablet Take 50 mg by mouth at bedtime as needed for sleep.    Historical Provider, MD    Family History Family History  Problem Relation Age of Onset  . Stroke Mother 26  . Hypertension Mother   . Cancer Sister   . Heart disease Sister   .  Heart disease Brother     Heart Disease before age 91  . Heart attack Brother   . Cancer Sister   . Cancer Brother     Social History Social History  Substance Use Topics  . Smoking status: Former Smoker    Packs/day: 0.30    Years: 40.00    Types: Cigarettes    Quit date: 03/26/1993  . Smokeless tobacco: Never Used  . Alcohol use No     Allergies   Patient has no known allergies.   Review of Systems Review of Systems  Unable to perform ROS: Dementia     Physical Exam Updated Vital Signs BP 158/72 (BP Location: Left Arm)   Pulse 70   Temp 98.6 F (37 C) (Oral)   Resp 16   Ht 6' (1.829 m)   Wt 77.1 kg   SpO2 100%   BMI 23.06 kg/m   Physical Exam  Constitutional: He appears well-developed and well-nourished.  HENT:  Head: Normocephalic and atraumatic.  Eyes: Conjunctivae are normal. Right eye exhibits no discharge. Left  eye exhibits no discharge.  Neck: Normal range of motion. Neck supple.  Cardiovascular: Normal rate, regular rhythm and normal heart sounds.   Pulses:      Dorsalis pedis pulses are 0 on the right side, and 0 on the left side.       Posterior tibial pulses are 0 on the right side, and 0 on the left side.  Pulmonary/Chest: Effort normal and breath sounds normal.  Abdominal: Soft. There is no tenderness.  Neurological: He is alert.  Skin: Skin is warm and dry. Capillary refill takes less than 2 seconds.  Left foot is slightly cool when compared to the right. No color change including pallor or erythema. Capillary refill less than 2 seconds in toes. No tenderness to palpation.  Psychiatric: He has a normal mood and affect.  Nursing note and vitals reviewed.    ED Treatments / Results   Procedures Procedures (including critical care time)  Medications Ordered in ED Medications  levofloxacin (LEVAQUIN) tablet 750 mg (750 mg Oral Given 03/15/16 0916)     Initial Impression / Assessment and Plan / ED Course  I have reviewed the triage vital signs and the nursing notes.  Pertinent labs & imaging results that were available during my care of the patient were reviewed by me and considered in my medical decision making (see chart for details).  Clinical Course    Patient seen and examined.    Vital signs reviewed and are as follows: BP 158/72 (BP Location: Left Arm)   Pulse 70   Temp 98.6 F (37 C) (Oral)   Resp 16   Ht 6' (1.829 m)   Wt 77.1 kg   SpO2 100%   BMI 23.06 kg/m   Patient discussed and seen with Dr. Rogene Houston. Agree no acute ischemic limb. Pt current asymptomatic. Exam consistent with what is reported in vascular note yesterday.   Will ambulate and perform bladder scan. Anticipate d/c to home.   9:39 AM UA shows UTI. Started patient on Levaquin. Culture sent. Wife updated.   Encouraged PCP follow-up for recheck in 3-5 days. Return with fever, vomiting, back  pain.   Final Clinical Impressions(s) / ED Diagnoses   Final diagnoses:  Acute cystitis without hematuria   Patient with lower extremity pain, urinary frequency. US shows UTI. He is voiding here. Patient currently has no complaints, history difficult 2/2 dementia. He has chronic LE PVD which appears baseline  2/2 vascular note yesterday. Seen with Dr. Rogene Houston,.   New Prescriptions Current Discharge Medication List    START taking these medications   Details  levofloxacin (LEVAQUIN) 750 MG tablet Take 1 tablet (750 mg total) by mouth daily. Qty: 6 tablet, Refills: 0         Carlisle Cater, PA-C 03/15/16 [id number]    Fredia Sorrow, MD 03/15/16 1455

## 2016-03-16 LAB — URINE CULTURE

## 2016-03-22 ENCOUNTER — Telehealth: Payer: Self-pay | Admitting: Surgery

## 2016-03-22 NOTE — Telephone Encounter (Signed)
I contacted Mrs. Orzech in follow up to a concern she had about an insurance denial of Mr. Santerre's angiogram on 01/31/16.  I assured her that [facility] hospital billing would follow up on the denial and, until she received a bill from [facility], she should assume the denial was being appealed.  I reminded her of Mr. Soriano appointment on January 22 at 2:30.  I asked her to arrive at 2:15.  She wrote down the appointment.  Ovidio Hanger [facility] Practice Administrator

## 2016-03-27 ENCOUNTER — Telehealth: Payer: Self-pay | Admitting: Internal Medicine

## 2016-03-27 NOTE — Telephone Encounter (Signed)
Called and spoke with pts wife and she stated that pt has been sick since Saturday.  She has been giving him medications to help with his breathing and is aware of appt tomorrow with MW>  She is aware to take him to the ER if worse.

## 2016-03-28 ENCOUNTER — Ambulatory Visit (INDEPENDENT_AMBULATORY_CARE_PROVIDER_SITE_OTHER)
Admission: RE | Admit: 2016-03-28 | Discharge: 2016-03-28 | Disposition: A | Discharge status: Home / Self Care | Payer: Self-pay | Source: Ambulatory Visit | Attending: Internal Medicine | Admitting: Internal Medicine

## 2016-03-28 ENCOUNTER — Encounter: Payer: Self-pay | Admitting: Internal Medicine

## 2016-03-28 ENCOUNTER — Ambulatory Visit (INDEPENDENT_AMBULATORY_CARE_PROVIDER_SITE_OTHER): Payer: Commercial Managed Care - HMO | Admitting: Internal Medicine

## 2016-03-28 VITALS — BP 160/82 | HR 89 | Ht 74.0 in | Wt 158.0 lb

## 2016-03-28 DIAGNOSIS — R05 Cough: Secondary | ICD-10-CM

## 2016-03-28 DIAGNOSIS — J449 Chronic obstructive pulmonary disease, unspecified: Secondary | ICD-10-CM

## 2016-03-28 DIAGNOSIS — J189 Pneumonia, unspecified organism: Secondary | ICD-10-CM

## 2016-03-28 DIAGNOSIS — R058 Other specified cough: Secondary | ICD-10-CM

## 2016-03-28 MED ORDER — FAMOTIDINE 20 MG PO TABS: ORAL_TABLET | ORAL | Status: DC

## 2016-03-28 MED ORDER — PREDNISONE 10 MG PO TABS: ORAL_TABLET | ORAL | 0 refills | Status: DC

## 2016-03-28 MED ORDER — AZITHROMYCIN 250 MG PO TABS: ORAL_TABLET | ORAL | 0 refills | Status: DC

## 2016-03-28 MED ORDER — PANTOPRAZOLE SODIUM 40 MG PO TBEC: 40.0000 mg | DELAYED_RELEASE_TABLET | Freq: Every day | ORAL | 2 refills | Status: DC

## 2016-03-28 NOTE — Patient Instructions (Addendum)
For cough / congestion > mucinex dm up to 1200 mg every 12 hours as needed   Zpak Prednisone 10 mg take  4 each am x 2 days,   2 each am x 2 days,  1 each am x 2 days and stop   Pantoprazole (protonix) 40 mg   Take  30-60 min before first meal of the day and Pepcid (famotidine)  20 mg one @  bedtime until return to office - this is the best way to tell whether stomach acid is contributing to your problem.     GERD (REFLUX)  is an extremely common cause of respiratory symptoms just like yours , many times with no obvious heartburn at all.    It can be treated with medication, but also with lifestyle changes including elevation of the head of your bed (ideally with 6 inch  bed blocks),  Smoking cessation, avoidance of late meals, excessive alcohol, and avoid fatty foods, chocolate, peppermint, colas, red wine, and acidic juices such as orange juice.  NO MINT OR MENTHOL PRODUCTS SO NO COUGH DROPS   USE SUGARLESS CANDY INSTEAD (Jolley ranchers or Stover's or Life Savers) or even ice chips will also do - the key is to swallow to prevent all throat clearing. NO OIL BASED VITAMINS - use powdered substitutes.  Please remember to go to the x-ray department downstairs for your tests - we will call you with the results when they are available.      See Tammy NP in 4  weeks with all your medications, even over the counter meds, separated in two separate bags, the ones you take no matter what vs the ones you stop once you feel better and take only as needed when you feel you need them.   Tammy  will generate for you a new user friendly medication calendar that will put [place] all on the same page re: your medication use.

## 2016-03-28 NOTE — Progress Notes (Signed)
Subjective:     Patient ID: [name], male   DOB: 10-29-1934  MRN: [id number]     Brief patient profile:  55  yobm s/p smoking cessation in 1995 previously felt to have COPD  GOLD II criteria with reversibility 01/06/09    History of Present Illness  January 06, 2009 Followup with PFT's. Pt states that overall breathing has been okay. He gets SOB when he gets in a rush. [name] states that pt gets out of breath just walking from one room to the next. Also he c/o dry cough- especially at night x 4-6 weeks eval by [facility] with cxr and rx cough syrup > no better. no purulent sputum. Try higher strength of Symbicort 160 2 puffs first thing in am and 2 puffs again in pm about 12 hours later and if happy with it fill the prescription.  as long as you are coughing take the zegerid otc at bedtime > seemed to help to pt and wife's satisfaction.       01/25/2016 NP  Follow up : COPD GOLD II  Patient presents for a one-month follow-up, along with his wife. Last visit was having a mild flare COPD, was given a prednisone taper with improvement in his symptoms. Patient cough has decreased. Wife says breathing has returned back to his baseline. Patient has moderate to severe dementia. We reviewed all his medications organize them into a medication count with patient education Appears be taking correctly. rec Continue on Symbicort Twice daily  , rinse after use.  Follow med calendar closely and bring to each visit   03/29/2016 acute extended ov/[name] re: sob/noisy breathing / GOLD II copd / no med calendar Chief Complaint  Patient presents with  . Acute Visit    Pt c/o SOB with or without any exertion for the past 5 days. He also c/o cough and wheezing. Cough is prod with white sputum.    very noisy breathing intermittently day and night lasts from one or two breaths to one or two min not present sleeping and still able to lie flat assoc with worse coughing fits/ no apparent sinus flare or  dysphagia/ choking on foods  No obvious day to day or daytime variability or assoc excess/ purulent sputum or mucus plugs or hemoptysis or cp or chest tightness, subjective wheeze or overt sinus or hb symptoms. No unusual exp hx or h/o childhood pna/ asthma or knowledge of premature birth.  Sleeping ok without nocturnal  or early am exacerbation  of respiratory  c/o's or need for noct saba. Also denies any obvious fluctuation of symptoms with weather or environmental changes or other aggravating or alleviating factors except as outlined above   Current Medications, Allergies, Complete Past Medical History, Past Surgical History, Family History, and Social History were reviewed in [employer] record.  ROS  The following are not active complaints unless bolded sore throat, dysphagia, dental problems, itching, sneezing,  nasal congestion or excess/ purulent secretions, ear ache,   fever, chills, sweats, unintended wt loss, classically pleuritic or exertional cp,  orthopnea pnd or leg swelling, presyncope, palpitations, abdominal pain, anorexia, nausea, vomiting, diarrhea  or change in bowel or bladder habits, change in stools or urine, dysuria,hematuria,  rash, arthralgias, visual complaints, headache, numbness, weakness or ataxia or problems with walking or coordination,  change in mood/affect or memory.            Past Medical History:  HYPERLIPIDEMIA (ICD-272.4)  PVD (ICD-443.9)  HYPERTENSION NEC  DYSPNEA (ICD-786.05)  COPD  PFT's 11/18/00 FEV1 is 1.94 or 60%  - PFT's 01/06/09 FEV1 1.75 (57%) ratio 49 and 21 % better FVC after B2 DLC0 71  - HFA 90% better p coaching 01/23/10  -med calendar 12/22/2014 . 01/25/2016             Objective:   Physical Exam   elderly black male ambulatory  / classic pseudowheeze pattern breathing   Vitals signs reviewed  - sats 99% on arrival RA  Wt  187 January 06, 2009 > 186 01/23/10 > 05/04/2011  191 > 06/16/2013  170 >169  09/01/2013 >   169 09/23/2013 > 06/04/2014  181 >171 12/22/2014 > 12/28/2015  168   HEENT mild turbinate edema. Edentulous,  Oropharynx no thrush or excess pnd or cobblestoning. No JVD or cervical adenopathy. Mild accessory muscle hypertrophy. Trachea midline, nl thryroid. Chest was hyperinflated by percussion with diminished breath sounds and decreased BS in bases .    Regular rate and rhythm without murmur gallop or rub or increase P2. No edema , neg homans sign Abd: no hsm, nl excursion. Ext warm without cyanosis or clubbing. Pleasant but  Confused/ doesn't know president or any recent national events      CXR PA and Lateral:   03/28/2016 :    I personally reviewed images and agree with radiology impression as follows:    COPD. Patchy interstitial density in the left lower lobe posteriorly more conspicuous than in the past may reflect pneumonia.   My review: no def as dz - note lateral is rotated and hard to read > repeat planned in 4 weeks

## 2016-03-28 NOTE — Progress Notes (Signed)
LMTCB

## 2016-03-29 ENCOUNTER — Encounter: Payer: Self-pay | Admitting: Internal Medicine

## 2016-03-29 NOTE — Assessment & Plan Note (Signed)
  Upper airway cough syndrome (previously labeled PNDS) , is  so named because it's frequently impossible to sort out how much is  CR/sinusitis with freq throat clearing (which can be related to primary GERD)   vs  causing  secondary (" extra esophageal")  GERD from wide swings in gastric pressure that occur with throat clearing, often  promoting self use of mint and menthol lozenges that reduce the lower esophageal sphincter tone and exacerbate the problem further in a cyclical fashion.   These are the same pts (now being labeled as having "irritable larynx syndrome" by some cough centers) who not infrequently have a history of having failed to tolerate ace inhibitors,  dry powder inhalers or biphosphonates or report having atypical/extraesophageal reflux symptoms that don't respond to standard doses of PPI  and are easily confused as having aecopd or asthma flares by even experienced allergists/ pulmonologists (myself included).   Of the three most common causes of chronic cough, only one (GERD)  can actually cause the other two (asthma and post nasal drip syndrome)  and perpetuate the cylce of cough inducing airway trauma, inflammation, heightened sensitivity to reflux which is prompted by the cough itself via a cyclical mechanism.    This may partially respond to steroids and look like asthma and post nasal drainage but never erradicated completely unless the cough and the secondary reflux are eliminated, preferably both at the same time.  While not intuitively obvious, many patients with chronic low grade reflux do not cough until there is a secondary insult that disturbs the protective epithelial barrier and exposes sensitive nerve endings.  This can be viral - as likely the case here - or direct physical injury such as with an endotracheal tube.   The point is that once this occurs, it is difficult to eliminate using anything but a maximally effective acid suppression regimen at least in the short  run, accompanied by an appropriate diet to address non acid GERD.

## 2016-03-29 NOTE — Assessment & Plan Note (Signed)
PFT's 11/18/00 FEV1 is 1.94 or 60%  - PFT's 01/06/09 FEV1 1.75 (57%) ratio 49 and 21 % better FVC after B2 DLC0 71  - Prevnar given 09/23/13  - 09/23/2013 p extensive coaching HFA effectiveness =    75%  - 06/04/2014  Trial of tudorza> did not maintain on it  - 12/28/2015  After extensive coaching HFA effectiveness =    50% from baseline 25%   Very little evidence of a flare despite increase cough / wheeze which appear to be upper airway in nature (see separate a/p)  - no change in maint  rx needed  I had an extended discussion with the patient reviewing all relevant studies completed to date and  lasting 15 to 20 minutes of a 25 minute visit    Each maintenance medication was reviewed in detail including most importantly the difference between maintenance and prns and under what circumstances the prns are to be triggered using an action plan format that is not reflected in the computer generated alphabetically organized AVS but rather by a customized med calendar that reflects the AVS meds with confirmed 100% correlation.   In addition, Please see AVS for unique instructions that I personally wrote and verbalized to the the pt in detail and then reviewed with pt  by my nurse highlighting any  changes in therapy recommended at today's visit to their plan of care.

## 2016-03-29 NOTE — Assessment & Plan Note (Signed)
Doubt this is pna but will cover with zpak and return in 4 weeks for repeat cxr

## 2016-03-30 NOTE — Progress Notes (Signed)
Spoke with pt and notified of results per Dr. Wert. Pt verbalized understanding and denied any questions. 

## 2016-04-10 ENCOUNTER — Encounter: Payer: Self-pay | Admitting: Family

## 2016-04-16 ENCOUNTER — Encounter: Payer: Self-pay | Admitting: Surgery

## 2016-04-16 ENCOUNTER — Ambulatory Visit (INDEPENDENT_AMBULATORY_CARE_PROVIDER_SITE_OTHER): Payer: PPO | Admitting: Surgery

## 2016-04-16 VITALS — BP 153/68 | HR 61 | Temp 97.2°F | Resp 20 | Ht 74.0 in | Wt 159.0 lb

## 2016-04-16 DIAGNOSIS — I70213 Atherosclerosis of native arteries of extremities with intermittent claudication, bilateral legs: Secondary | ICD-10-CM

## 2016-04-16 NOTE — Progress Notes (Signed)
Vascular and Vein Specialist of [facility]  Patient name: [name] MRN: [id number] DOB: 15-Nov-1934 Sex: male   REASON FOR VISIT:    Follow up  HISOTRY OF PRESENT ILLNESS:    [name] is a 82 y.o. male turns today for follow-up.  He has a history of a right to left femoral-femoral bypass graft.  He is also undergone bilateral femoral-popliteal bypass grafts.  All this was done by Dr. Kellie Simmering.  He recently was found to have a drop in his ankle brachial indices and elevated velocities within the iliac system therefore on November 11 he was taken for arteriogram and underwent atherectomy and drug coated balloon and plasty of the right iliac artery and subsequent stenting of a non-flow-limiting dissection.  At his 1 month follow-up he was found to have a high-grade stenosis within the right external iliac artery with velocities of 511 cm/s.  This was associated with a decrease in his ABIs.  He was scheduled for arteriogram, however this was not approved.  He is back today for further discussions.  Patient continues to live at home with his wife.  He does suffer from significant dementia.  He complains of constant pain in both legs which has been a chronic problem for him.  According to his wife he has had chest pain, but she is not sure if this is accurate given his dementia.   PAST MEDICAL HISTORY:   Past Medical History:  Diagnosis Date  . Alzheimer's dementia 2013  . Anemia    HX OF ANEMIA  . CHF (congestive heart failure) ([place])   . Colon cancer ([place])   . COPD (chronic obstructive pulmonary disease) ([place])   . Depression   . Dyspnea   . Hyperlipidemia   . Hypertension   . Pneumonia 2014  . PVD (peripheral vascular disease) ([place])   . Type II diabetes mellitus ([place])      FAMILY HISTORY:   Family History  Problem Relation Age of Onset  . Stroke Mother 6  . Hypertension Mother   . Cancer Sister   . Heart disease Sister   . Heart  disease Brother     Heart Disease before age 59  . Heart attack Brother   . Cancer Sister   . Cancer Brother     SOCIAL HISTORY:   Social History  Substance Use Topics  . Smoking status: Former Smoker    Packs/day: 0.30    Years: 40.00    Types: Cigarettes    Quit date: 03/26/1993  . Smokeless tobacco: Never Used  . Alcohol use No     ALLERGIES:   No Known Allergies   CURRENT MEDICATIONS:   Current Outpatient Prescriptions  Medication Sig Dispense Refill  . albuterol (PROVENTIL HFA;VENTOLIN HFA) 108 (90 BASE) MCG/ACT inhaler Inhale 2 puffs into the lungs every 6 (six) hours as needed for shortness of breath. 1 Inhaler 5  . albuterol (PROVENTIL) (2.5 MG/3ML) 0.083% nebulizer solution Inhale 3 mLs into the lungs every 6 (six) hours as needed for wheezing or shortness of breath.     Marland Kitchen aspirin 81 MG tablet Take 81 mg by mouth every morning.     Marland Kitchen azithromycin (ZITHROMAX) 250 MG tablet Take 2 on day one then 1 daily x 4 days 6 tablet 0  . budesonide-formoterol (SYMBICORT) 160-4.5 MCG/ACT inhaler Inhale 2 puffs into the lungs 2 (two) times daily. 1 Inhaler 0  . Cholecalciferol (VITAMIN D) 2000 UNITS CAPS Take 1,000 Units by mouth at  bedtime.     . clopidogrel (PLAVIX) 75 MG tablet Take 1 tablet (75 mg total) by mouth daily. 30 tablet 11  . dextromethorphan-guaiFENesin (MUCINEX DM) 30-600 MG 12hr tablet Take 1 tablet by mouth 2 (two) times daily as needed for cough.    . digoxin (LANOXIN) 0.125 MG tablet Take 0.125 mg by mouth every morning.     . famotidine (PEPCID) 20 MG tablet One at bedtime    . furosemide (LASIX) 20 MG tablet Take 1 tablet (20 mg total) by mouth daily. (Patient taking differently: Take 20 mg by mouth every morning. ) 5 tablet 0  . metoprolol tartrate (LOPRESSOR) 25 MG tablet Take 0.5 tablets (12.5 mg total) by mouth 2 (two) times daily. 30 tablet 0  . Multiple Vitamins-Minerals (CENTRUM SILVER PO) Take 1 tablet by mouth daily.     . pantoprazole (PROTONIX) 40  MG tablet Take 1 tablet (40 mg total) by mouth daily. Take 30-60 min before first meal of the day 30 tablet 2  . pravastatin (PRAVACHOL) 40 MG tablet Take 40 mg by mouth every evening.     . predniSONE (DELTASONE) 10 MG tablet Take  4 each am x 2 days,   2 each am x 2 days,  1 each am x 2 days and stop 14 tablet 0  . TRADJENTA 5 MG TABS tablet Take 1 tablet by mouth daily.    . traZODone (DESYREL) 50 MG tablet Take 50 mg by mouth at bedtime as needed for sleep.     No current facility-administered medications for this visit.     REVIEW OF SYSTEMS:   [X]  denotes positive finding, [ ]  denotes negative finding Cardiac  Comments:  Chest pain or chest pressure: x   Shortness of breath upon exertion:    Short of breath when lying flat:    Irregular heart rhythm:        Vascular    Pain in calf, thigh, or hip brought on by ambulation: x   Pain in feet at night that wakes you up from your sleep:  x   Blood clot in your veins:    Leg swelling:         Pulmonary    Oxygen at home:    Productive cough:     Wheezing:         Neurologic    Sudden weakness in arms or legs:     Sudden numbness in arms or legs:     Sudden onset of difficulty speaking or slurred speech:    Temporary loss of vision in one eye:     Problems with dizziness:         Gastrointestinal    Blood in stool:     Vomited blood:         Genitourinary    Burning when urinating:     Blood in urine:        Psychiatric    Major depression:         Hematologic    Bleeding problems:    Problems with blood clotting too easily:        Skin    Rashes or ulcers:        Constitutional    Fever or chills:      PHYSICAL EXAM:   Vitals:   04/16/16 1433 04/16/16 1435  BP: (!) 153/70 (!) 153/68  Pulse: 61   Resp: 20   Temp: 97.2 F (36.2 C)   TempSrc: Oral  SpO2: 99%   Weight: 159 lb (72.1 kg)   Height: 6\' 2"  (1.88 m)     GENERAL: The patient is a well-nourished male, in no acute distress. The vital signs  are documented above. CARDIAC: There is a regular rate and rhythm.  VASCULAR: Palpable femoral femoral graft pulse PULMONARY: Non-labored respirations ABDOMEN: Soft and non-tender with normal pitched bowel sounds.  MUSCULOSKELETAL: There are no major deformities or cyanosis. NEUROLOGIC: No focal weakness or paresthesias are detected. SKIN: There are no ulcers or rashes noted. PSYCHIATRIC: The patient has a normal affect.  STUDIES:   High velocities in the right external iliac artery measuring 5 11 cm/s low velocities within the femoral-femoral bypass graft  MEDICAL ISSUES:   Bilateral claudication: The patient has developed a elevated velocity within the right external iliac artery, since his most recent intervention.  This is concerning because this is the inflow to both the right and left leg.  I feel like this needs to be further evaluated with angiography and intervened on and if it is real.  Because of the femoral-femoral bypass graft, this will best be done with a left brachial approach.  The patient will need to be kept in the hospital overnight because of his dementia and the inability of his wife to take care of him after procedure.  I will ask Dr. Nadyne Coombes to evaluate the patient while he is in the hospital given his complaints of chest pain.    [name], MD Vascular and Vein Specialists of [facility] [phone] Pager [phone]

## 2016-04-17 ENCOUNTER — Other Ambulatory Visit: Payer: Self-pay

## 2016-04-24 ENCOUNTER — Ambulatory Visit (HOSPITAL_COMMUNITY)
Admission: RE | Admit: 2016-04-24 | Discharge: 2016-04-24 | Disposition: A | Discharge status: Home / Self Care | Payer: PPO | Source: Ambulatory Visit | Attending: Surgery | Admitting: Surgery

## 2016-04-24 ENCOUNTER — Other Ambulatory Visit: Payer: Self-pay | Admitting: *Deleted

## 2016-04-24 ENCOUNTER — Encounter (HOSPITAL_COMMUNITY)
Admission: RE | Disposition: A | Discharge status: Home / Self Care | Payer: Self-pay | Source: Ambulatory Visit | Attending: Surgery

## 2016-04-24 DIAGNOSIS — Z7952 Long term (current) use of systemic steroids: Secondary | ICD-10-CM | POA: Insufficient documentation

## 2016-04-24 DIAGNOSIS — R079 Chest pain, unspecified: Secondary | ICD-10-CM | POA: Insufficient documentation

## 2016-04-24 DIAGNOSIS — Z7902 Long term (current) use of antithrombotics/antiplatelets: Secondary | ICD-10-CM | POA: Diagnosis not present

## 2016-04-24 DIAGNOSIS — Z87891 Personal history of nicotine dependence: Secondary | ICD-10-CM | POA: Diagnosis not present

## 2016-04-24 DIAGNOSIS — E785 Hyperlipidemia, unspecified: Secondary | ICD-10-CM | POA: Insufficient documentation

## 2016-04-24 DIAGNOSIS — D649 Anemia, unspecified: Secondary | ICD-10-CM | POA: Diagnosis not present

## 2016-04-24 DIAGNOSIS — F329 Major depressive disorder, single episode, unspecified: Secondary | ICD-10-CM | POA: Diagnosis not present

## 2016-04-24 DIAGNOSIS — Z8249 Family history of ischemic heart disease and other diseases of the circulatory system: Secondary | ICD-10-CM | POA: Insufficient documentation

## 2016-04-24 DIAGNOSIS — F0280 Dementia in other diseases classified elsewhere without behavioral disturbance: Secondary | ICD-10-CM | POA: Diagnosis not present

## 2016-04-24 DIAGNOSIS — I700 Atherosclerosis of aorta: Secondary | ICD-10-CM | POA: Insufficient documentation

## 2016-04-24 DIAGNOSIS — Z823 Family history of stroke: Secondary | ICD-10-CM | POA: Diagnosis not present

## 2016-04-24 DIAGNOSIS — I743 Embolism and thrombosis of arteries of the lower extremities: Secondary | ICD-10-CM | POA: Diagnosis not present

## 2016-04-24 DIAGNOSIS — Z85038 Personal history of other malignant neoplasm of large intestine: Secondary | ICD-10-CM | POA: Diagnosis not present

## 2016-04-24 DIAGNOSIS — Z95820 Peripheral vascular angioplasty status with implants and grafts: Secondary | ICD-10-CM | POA: Insufficient documentation

## 2016-04-24 DIAGNOSIS — E1151 Type 2 diabetes mellitus with diabetic peripheral angiopathy without gangrene: Secondary | ICD-10-CM | POA: Diagnosis not present

## 2016-04-24 DIAGNOSIS — G309 Alzheimer's disease, unspecified: Secondary | ICD-10-CM | POA: Diagnosis not present

## 2016-04-24 DIAGNOSIS — I70213 Atherosclerosis of native arteries of extremities with intermittent claudication, bilateral legs: Secondary | ICD-10-CM | POA: Insufficient documentation

## 2016-04-24 DIAGNOSIS — I739 Peripheral vascular disease, unspecified: Secondary | ICD-10-CM

## 2016-04-24 DIAGNOSIS — Z9862 Peripheral vascular angioplasty status: Secondary | ICD-10-CM

## 2016-04-24 DIAGNOSIS — I509 Heart failure, unspecified: Secondary | ICD-10-CM | POA: Insufficient documentation

## 2016-04-24 DIAGNOSIS — Z7951 Long term (current) use of inhaled steroids: Secondary | ICD-10-CM | POA: Diagnosis not present

## 2016-04-24 DIAGNOSIS — J449 Chronic obstructive pulmonary disease, unspecified: Secondary | ICD-10-CM | POA: Insufficient documentation

## 2016-04-24 DIAGNOSIS — I110 Hypertensive heart disease with heart failure: Secondary | ICD-10-CM | POA: Insufficient documentation

## 2016-04-24 DIAGNOSIS — Z7982 Long term (current) use of aspirin: Secondary | ICD-10-CM | POA: Diagnosis not present

## 2016-04-24 HISTORY — PX: PERIPHERAL VASCULAR CATHETERIZATION: SHX172C

## 2016-04-24 LAB — POCT I-STAT, CHEM 8
BUN: 12 mg/dL (ref 6–20)
CHLORIDE: 101 mmol/L (ref 101–111)
Calcium, Ion: 1.19 mmol/L (ref 1.15–1.40)
Creatinine, Ser: 1 mg/dL (ref 0.61–1.24)
Glucose, Bld: 139 mg/dL — ABNORMAL HIGH (ref 65–99)
HEMATOCRIT: 39 % (ref 39.0–52.0)
HEMOGLOBIN: 13.3 g/dL (ref 13.0–17.0)
POTASSIUM: 4 mmol/L (ref 3.5–5.1)
SODIUM: 138 mmol/L (ref 135–145)
TCO2: 29 mmol/L (ref 0–100)

## 2016-04-24 LAB — POCT ACTIVATED CLOTTING TIME
ACTIVATED CLOTTING TIME: 169 s
Activated Clotting Time: 213 seconds

## 2016-04-24 LAB — GLUCOSE, CAPILLARY: GLUCOSE-CAPILLARY: 105 mg/dL — AB (ref 65–99)

## 2016-04-24 SURGERY — ABDOMINAL AORTOGRAM W/LOWER EXTREMITY
Laterality: Right

## 2016-04-24 MED ORDER — HEPARIN SODIUM (PORCINE) 1000 UNIT/ML IJ SOLN
INTRAMUSCULAR | Status: DC | PRN
  Administered 2016-04-24: 2000 [IU] via INTRAVENOUS
  Administered 2016-04-24: 3000 [IU] via INTRAVENOUS

## 2016-04-24 MED ORDER — NITROGLYCERIN 1 MG/10 ML FOR IR/CATH LAB
INTRA_ARTERIAL | Status: DC | PRN
  Administered 2016-04-24: 200 ug

## 2016-04-24 MED ORDER — HEPARIN SODIUM (PORCINE) 1000 UNIT/ML IJ SOLN
INTRAMUSCULAR | Status: AC
  Filled 2016-04-24: qty 1

## 2016-04-24 MED ORDER — PHENOL 1.4 % MT LIQD
1.0000 | OROMUCOSAL | Status: DC | PRN
Start: 2016-04-24 — End: 2016-04-25

## 2016-04-24 MED ORDER — LIDOCAINE HCL (PF) 1 % IJ SOLN
INTRAMUSCULAR | Status: DC | PRN
  Administered 2016-04-24: 6 mL

## 2016-04-24 MED ORDER — LABETALOL HCL 5 MG/ML IV SOLN
10.0000 mg | INTRAVENOUS | Status: DC | PRN
  Administered 2016-04-24 (×2): 10 mg via INTRAVENOUS

## 2016-04-24 MED ORDER — LIDOCAINE HCL (PF) 1 % IJ SOLN
INTRAMUSCULAR | Status: AC
  Filled 2016-04-24: qty 30

## 2016-04-24 MED ORDER — NITROGLYCERIN 1 MG/10 ML FOR IR/CATH LAB
INTRA_ARTERIAL | Status: AC
  Filled 2016-04-24: qty 10

## 2016-04-24 MED ORDER — HYDRALAZINE HCL 20 MG/ML IJ SOLN
5.0000 mg | INTRAMUSCULAR | Status: DC | PRN
  Administered 2016-04-24: 5 mg via INTRAVENOUS

## 2016-04-24 MED ORDER — IODIXANOL 320 MG/ML IV SOLN
INTRAVENOUS | Status: DC | PRN
  Administered 2016-04-24: 95 mL via INTRA_ARTERIAL

## 2016-04-24 MED ORDER — MORPHINE SULFATE (PF) 10 MG/ML IV SOLN: 2.0000 mg | INTRAVENOUS | Status: DC | PRN

## 2016-04-24 MED ORDER — HYDRALAZINE HCL 20 MG/ML IJ SOLN
INTRAMUSCULAR | Status: AC
  Administered 2016-04-24: 5 mg via INTRAVENOUS
  Filled 2016-04-24: qty 1

## 2016-04-24 MED ORDER — GUAIFENESIN-DM 100-10 MG/5ML PO SYRP
15.0000 mL | ORAL_SOLUTION | ORAL | Status: DC | PRN
  Filled 2016-04-24: qty 15

## 2016-04-24 MED ORDER — ACETAMINOPHEN 325 MG PO TABS
325.0000 mg | ORAL_TABLET | ORAL | Status: DC | PRN
  Filled 2016-04-24: qty 2

## 2016-04-24 MED ORDER — ACETAMINOPHEN 325 MG RE SUPP
325.0000 mg | RECTAL | Status: DC | PRN
  Filled 2016-04-24: qty 2

## 2016-04-24 MED ORDER — LABETALOL HCL 5 MG/ML IV SOLN
INTRAVENOUS | Status: AC
Start: 2016-04-24 — End: 2016-04-24
  Filled 2016-04-24: qty 4

## 2016-04-24 MED ORDER — OXYCODONE HCL 5 MG PO TABS: 5.0000 mg | ORAL_TABLET | ORAL | Status: DC | PRN

## 2016-04-24 MED ORDER — HEPARIN (PORCINE) IN NACL 2-0.9 UNIT/ML-% IJ SOLN
INTRAMUSCULAR | Status: DC | PRN
  Administered 2016-04-24: 1000 mL via INTRA_ARTERIAL

## 2016-04-24 MED ORDER — ONDANSETRON HCL 4 MG/2ML IJ SOLN: 4.0000 mg | Freq: Four times a day (QID) | INTRAMUSCULAR | Status: DC | PRN

## 2016-04-24 MED ORDER — SODIUM CHLORIDE 0.9 % IV SOLN
INTRAVENOUS | Status: DC
  Administered 2016-04-24: 12:00:00 via INTRAVENOUS

## 2016-04-24 MED ORDER — SODIUM CHLORIDE 0.9 % IV SOLN: 1.0000 mL/kg/h | INTRAVENOUS | Status: DC

## 2016-04-24 MED ORDER — ALUM & MAG HYDROXIDE-SIMETH 200-200-20 MG/5ML PO SUSP
15.0000 mL | ORAL | Status: DC | PRN
  Filled 2016-04-24: qty 30

## 2016-04-24 MED ORDER — DOCUSATE SODIUM 100 MG PO CAPS: 100.0000 mg | ORAL_CAPSULE | Freq: Every day | ORAL | Status: DC

## 2016-04-24 MED ORDER — METOPROLOL TARTRATE 5 MG/5ML IV SOLN: 2.0000 mg | INTRAVENOUS | Status: DC | PRN

## 2016-04-24 MED ORDER — HEPARIN (PORCINE) IN NACL 2-0.9 UNIT/ML-% IJ SOLN
INTRAMUSCULAR | Status: AC
  Filled 2016-04-24: qty 1000

## 2016-04-24 SURGICAL SUPPLY — 23 items
BALLN MUSTANG 7.0X40 135 (BALLOONS) ×3
BALLN MUSTANG 7.0X40 135CM (BALLOONS) ×1
BALLOON MUSTANG 7.0X40 135 (BALLOONS) ×2 IMPLANT
CATH ANGIO 5F PIGTAIL 100CM (CATHETERS) ×4 IMPLANT
CATH HEADHUNTER 5F 125CM (CATHETERS) ×4 IMPLANT
COVER PRB 48X5XTLSCP FOLD TPE (BAG) ×4 IMPLANT
COVER PROBE 5X48 (BAG) ×4
DEVICE CONTINUOUS FLUSH (MISCELLANEOUS) ×4 IMPLANT
GUIDEWIRE ANGLED .035X260CM (WIRE) ×4 IMPLANT
GUIDEWIRE INQWIRE 1.5J.035X260 (WIRE) ×4 IMPLANT
INQWIRE 1.5J .035X260CM (WIRE) ×8
KIT ENCORE 26 ADVANTAGE (KITS) ×4 IMPLANT
KIT PV (KITS) ×4 IMPLANT
SHEATH PINNACLE 6F 10CM (SHEATH) ×4 IMPLANT
SHIELD RADPAD SCOOP 12X17 (MISCELLANEOUS) ×4 IMPLANT
STENT INNOVA 8X40X130 (Permanent Stent) ×4 IMPLANT
STOPCOCK MORSE 400PSI 3WAY (MISCELLANEOUS) ×4 IMPLANT
SYRINGE MEDRAD AVANTA MACH 7 (SYRINGE) ×4 IMPLANT
TRANSDUCER W/STOPCOCK (MISCELLANEOUS) ×4 IMPLANT
TRAY PV CATH (CUSTOM PROCEDURE TRAY) ×4 IMPLANT
TUBING HIGH PRESSURE 120CM (CONNECTOR) ×4 IMPLANT
WIRE HI TORQ VERSACORE J 260CM (WIRE) ×8 IMPLANT
WIRE MINI STICK MAX (SHEATH) ×4 IMPLANT

## 2016-04-24 NOTE — Progress Notes (Signed)
23fr sheath aspirated and removed from left brachial artery. Manual pressure applied for 30 minutes. Site level 0, tegaderm dressing applied. Spo2 100% as measured in left thumb. Armboard applied to left arm as  patient cannot remember not to move left arm. Bedrest instructions given. Left radial pulse easily palpable.   Bedrest begins at 17:00:00

## 2016-04-24 NOTE — Discharge Instructions (Signed)
BRACHIAL Site Care Introduction Refer to this sheet in the next few weeks. These instructions provide you with information about caring for yourself after your procedure. Your health care provider may also give you more specific instructions. Your treatment has been planned according to current medical practices, but problems sometimes occur. Call your health care provider if you have any problems or questions after your procedure. What can I expect after the procedure? After your procedure, it is typical to have the following:  Bruising at the [facility] that usually fades within 1-2 weeks.  Blood collecting in the tissue (hematoma) that may be painful to the touch. It should usually decrease in size and tenderness within 1-2 weeks. Follow these instructions at home:  Take medicines only as directed by your health care provider.  You may shower 24-48 hours after the procedure or as directed by your health care provider. Remove the bandage (dressing) and gently wash the site with plain soap and water. Pat the area dry with a clean towel. Do not rub the site, because this may cause bleeding.  Do not take baths, swim, or use a hot tub until your health care provider approves.  Check your insertion site every day for redness, swelling, or drainage.  Do not apply powder or lotion to the site.  Do not flex or bend the affected arm for 24 hours or as directed by your health care provider.  Do not push or pull heavy objects with the affected arm for 24 hours or as directed by your health care provider.  Do not lift over 10 lb (4.5 kg) for 5 days after your procedure or as directed by your health care provider.  Ask your health care provider when it is okay to:  Return to work or school.  Resume usual physical activities or sports.  Resume sexual activity.  Do not drive home if you are discharged the same day as the procedure. Have someone else drive you.  You may drive 24 hours after  the procedure unless otherwise instructed by your health care provider.  Do not operate machinery or power tools for 24 hours after the procedure.  If your procedure was done as an outpatient procedure, which means that you went home the same day as your procedure, a responsible adult should be with you for the first 24 hours after you arrive home.  Keep all follow-up visits as directed by your health care provider. This is important. Contact a health care provider if:  You have a fever.  You have chills.  You have increased bleeding from the radial site. Hold pressure on the site. Get help right away if:  You have unusual pain at the radial site.  You have redness, warmth, or swelling at the radial site.  You have drainage (other than a small amount of blood on the dressing) from the radial site.  The radial site is bleeding, and the bleeding does not stop after 30 minutes of holding steady pressure on the site.  Your arm or hand becomes pale, cool, tingly, or numb. This information is not intended to replace advice given to you by your health care provider. Make sure you discuss any questions you have with your health care provider. Document Released: 04/14/2010 Document Revised: 08/18/2015 Document Reviewed: 09/28/2013  2017 Elsevier

## 2016-04-24 NOTE — Op Note (Signed)
Patient name: [name] MRN: [id number] DOB: 30-Dec-1934 Sex: male  04/24/2016 Pre-operative Diagnosis: Right iliac stenosis Post-operative diagnosis:  Same Surgeon:  [name] Procedure Performed:  1.  Ultrasound-guided access, left brachial artery  2.  Abdominal aortogram  3.  Stent, right external iliac artery   Indications:  The patient has a history of a right to left femoral-femoral bypass graft.  In November 2017 he underwent intervention of the right iliac system.  At his surveillance ultrasound study he was found to have elevated velocities within the external iliac artery on the right with decreased ABIs bilaterally.  Therefore he comes in today for further evaluation  Procedure:  The patient was identified in the holding area and taken to room 8.  The patient was then placed supine on the table and prepped and draped in the usual sterile fashion.  A time out was called.  Ultrasound was used to evaluate the left brachial  artery.  It was patent .  A digital ultrasound image was acquired.  A micropuncture needle was used to access the left brachial artery under ultrasound guidance.  An 018 wire was advanced followed by a micropuncture sheath.  Next an 035 wire was inserted followed by 6 [place] sheath.  2000 units of heparin and 200 g of nitroglycerin were administered through the sheath.  Using a pigtail catheter and a woolly wire, the catheter was able to placed into the abdominal aorta at the level of L2 and an abdominal aortogram was performed.  Next using a headhunter catheter the right iliac artery was selected and additional selective images of the right iliac system was performed  Findings:   Aortogram:  No significant renal artery stenosis was identified.  The infrarenal abdominal aorta is calcified but patent throughout it's course without hematuria significant stenosis.  Left iliac artery is occluded.  There is a stent within the right common iliac artery which is  widely patent.  The right hypogastric artery is occluded.  The previously placed stent at the junction of the common and external iliac artery is widely patent.  Just below this there is a haziness in the external iliac artery, likely correlating with the ultrasound findings.  Right Lower Extremity:  The right to left femoral-femoral bypass graft is widely patent.   Intervention:  After the above images, the decision was made to proceed with intervention.  Using the headhunter catheter and a Glidewire was able to get access across the area of concern however this was somewhat challenging to cross.  The patient was fully heparinized.  I used a catheter to exchange out to a 035 J-wire.  I then selected a 8 x 40 Innova stent and advanced across the lesion with some difficulty and deployed it landing within the previously placed stent.  Next a 7 x 40 Mustang balloon was used to dilate the stent.  Completion imaging revealed resolution of the stenosis which is probably about 80%.  Catheters and wires were removed.  The patient be taken the holding it for sheath pull once correlation profile corrects  Impression:  #1  area identified on ultrasound with increased velocities to 511 cm/s was in the external iliac artery below the previously deployed stent.  I had some difficulty crossed this lesion but was ultimately successful.   I elected to primarily stented this using an 8 x 40 INNOVA stent with complete resolution of the stenosis  #2  widely patent femoral-femoral bypass graft   V. [name],  M.D. Vascular and Vein Specialists of [place] Office: [phone] Pager:  [phone]

## 2016-04-25 ENCOUNTER — Encounter (HOSPITAL_COMMUNITY): Payer: Self-pay | Admitting: Surgery

## 2016-04-25 NOTE — Interval H&P Note (Signed)
History and Physical Interval Note:  04/25/2016 9:10 PM  [name]  has presented today for surgery, with the diagnosis of pvd w/ bilateral leg claudication  The various methods of treatment have been discussed with the patient and family. After consideration of risks, benefits and other options for treatment, the patient has consented to  Procedure(s) with comments: Abdominal Aortogram w/Lower Extremity (N/A) Peripheral Vascular Intervention (Right) - external illiac Rt as a surgical intervention .  The patient's history has been reviewed, patient examined, no change in status, stable for surgery.  I have reviewed the patient's chart and labs.  Questions were answered to the patient's satisfaction.     [name]

## 2016-04-25 NOTE — H&P (View-Only) (Signed)
Vascular and Vein Specialist of [facility]  Patient name: [name] MRN: [id number] DOB: 03-May-1934 Sex: male   REASON FOR VISIT:    Follow up  HISOTRY OF PRESENT ILLNESS:    [name] is a 82 y.o. male turns today for follow-up.  He has a history of a right to left femoral-femoral bypass graft.  He is also undergone bilateral femoral-popliteal bypass grafts.  All this was done by Dr. Kellie Simmering.  He recently was found to have a drop in his ankle brachial indices and elevated velocities within the iliac system therefore on November 11 he was taken for arteriogram and underwent atherectomy and drug coated balloon and plasty of the right iliac artery and subsequent stenting of a non-flow-limiting dissection.  At his 1 month follow-up he was found to have a high-grade stenosis within the right external iliac artery with velocities of 511 cm/s.  This was associated with a decrease in his ABIs.  He was scheduled for arteriogram, however this was not approved.  He is back today for further discussions.  Patient continues to live at home with his wife.  He does suffer from significant dementia.  He complains of constant pain in both legs which has been a chronic problem for him.  According to his wife he has had chest pain, but she is not sure if this is accurate given his dementia.   PAST MEDICAL HISTORY:   Past Medical History:  Diagnosis Date  . Alzheimer's dementia 2013  . Anemia    HX OF ANEMIA  . CHF (congestive heart failure) ([place])   . Colon cancer ([place])   . COPD (chronic obstructive pulmonary disease) ([place])   . Depression   . Dyspnea   . Hyperlipidemia   . Hypertension   . Pneumonia 2014  . PVD (peripheral vascular disease) ([place])   . Type II diabetes mellitus ([place])      FAMILY HISTORY:   Family History  Problem Relation Age of Onset  . Stroke Mother 82  . Hypertension Mother   . Cancer Sister   . Heart disease Sister   . Heart  disease Brother     Heart Disease before age 59  . Heart attack Brother   . Cancer Sister   . Cancer Brother     SOCIAL HISTORY:   Social History  Substance Use Topics  . Smoking status: Former Smoker    Packs/day: 0.30    Years: 40.00    Types: Cigarettes    Quit date: 03/26/1993  . Smokeless tobacco: Never Used  . Alcohol use No     ALLERGIES:   No Known Allergies   CURRENT MEDICATIONS:   Current Outpatient Prescriptions  Medication Sig Dispense Refill  . albuterol (PROVENTIL HFA;VENTOLIN HFA) 108 (90 BASE) MCG/ACT inhaler Inhale 2 puffs into the lungs every 6 (six) hours as needed for shortness of breath. 1 Inhaler 5  . albuterol (PROVENTIL) (2.5 MG/3ML) 0.083% nebulizer solution Inhale 3 mLs into the lungs every 6 (six) hours as needed for wheezing or shortness of breath.     Marland Kitchen aspirin 81 MG tablet Take 81 mg by mouth every morning.     Marland Kitchen azithromycin (ZITHROMAX) 250 MG tablet Take 2 on day one then 1 daily x 4 days 6 tablet 0  . budesonide-formoterol (SYMBICORT) 160-4.5 MCG/ACT inhaler Inhale 2 puffs into the lungs 2 (two) times daily. 1 Inhaler 0  . Cholecalciferol (VITAMIN D) 2000 UNITS CAPS Take 1,000 Units by mouth at  bedtime.     . clopidogrel (PLAVIX) 75 MG tablet Take 1 tablet (75 mg total) by mouth daily. 30 tablet 11  . dextromethorphan-guaiFENesin (MUCINEX DM) 30-600 MG 12hr tablet Take 1 tablet by mouth 2 (two) times daily as needed for cough.    . digoxin (LANOXIN) 0.125 MG tablet Take 0.125 mg by mouth every morning.     . famotidine (PEPCID) 20 MG tablet One at bedtime    . furosemide (LASIX) 20 MG tablet Take 1 tablet (20 mg total) by mouth daily. (Patient taking differently: Take 20 mg by mouth every morning. ) 5 tablet 0  . metoprolol tartrate (LOPRESSOR) 25 MG tablet Take 0.5 tablets (12.5 mg total) by mouth 2 (two) times daily. 30 tablet 0  . Multiple Vitamins-Minerals (CENTRUM SILVER PO) Take 1 tablet by mouth daily.     . pantoprazole (PROTONIX) 40  MG tablet Take 1 tablet (40 mg total) by mouth daily. Take 30-60 min before first meal of the day 30 tablet 2  . pravastatin (PRAVACHOL) 40 MG tablet Take 40 mg by mouth every evening.     . predniSONE (DELTASONE) 10 MG tablet Take  4 each am x 2 days,   2 each am x 2 days,  1 each am x 2 days and stop 14 tablet 0  . TRADJENTA 5 MG TABS tablet Take 1 tablet by mouth daily.    . traZODone (DESYREL) 50 MG tablet Take 50 mg by mouth at bedtime as needed for sleep.     No current facility-administered medications for this visit.     REVIEW OF SYSTEMS:   [X]  denotes positive finding, [ ]  denotes negative finding Cardiac  Comments:  Chest pain or chest pressure: x   Shortness of breath upon exertion:    Short of breath when lying flat:    Irregular heart rhythm:        Vascular    Pain in calf, thigh, or hip brought on by ambulation: x   Pain in feet at night that wakes you up from your sleep:  x   Blood clot in your veins:    Leg swelling:         Pulmonary    Oxygen at home:    Productive cough:     Wheezing:         Neurologic    Sudden weakness in arms or legs:     Sudden numbness in arms or legs:     Sudden onset of difficulty speaking or slurred speech:    Temporary loss of vision in one eye:     Problems with dizziness:         Gastrointestinal    Blood in stool:     Vomited blood:         Genitourinary    Burning when urinating:     Blood in urine:        Psychiatric    Major depression:         Hematologic    Bleeding problems:    Problems with blood clotting too easily:        Skin    Rashes or ulcers:        Constitutional    Fever or chills:      PHYSICAL EXAM:   Vitals:   04/16/16 1433 04/16/16 1435  BP: (!) 153/70 (!) 153/68  Pulse: 61   Resp: 20   Temp: 97.2 F (36.2 C)   TempSrc: Oral  SpO2: 99%   Weight: 159 lb (72.1 kg)   Height: 6\' 2"  (1.88 m)     GENERAL: The patient is a well-nourished male, in no acute distress. The vital signs  are documented above. CARDIAC: There is a regular rate and rhythm.  VASCULAR: Palpable femoral femoral graft pulse PULMONARY: Non-labored respirations ABDOMEN: Soft and non-tender with normal pitched bowel sounds.  MUSCULOSKELETAL: There are no major deformities or cyanosis. NEUROLOGIC: No focal weakness or paresthesias are detected. SKIN: There are no ulcers or rashes noted. PSYCHIATRIC: The patient has a normal affect.  STUDIES:   High velocities in the right external iliac artery measuring 5 11 cm/s low velocities within the femoral-femoral bypass graft  MEDICAL ISSUES:   Bilateral claudication: The patient has developed a elevated velocity within the right external iliac artery, since his most recent intervention.  This is concerning because this is the inflow to both the right and left leg.  I feel like this needs to be further evaluated with angiography and intervened on and if it is real.  Because of the femoral-femoral bypass graft, this will best be done with a left brachial approach.  The patient will need to be kept in the hospital overnight because of his dementia and the inability of his wife to take care of him after procedure.  I will ask Dr. Nadyne Coombes to evaluate the patient while he is in the hospital given his complaints of chest pain.    [name], MD Vascular and Vein Specialists of [facility] [phone] Pager [phone]

## 2016-04-26 ENCOUNTER — Encounter: Payer: Self-pay | Admitting: Adult Health

## 2016-04-27 DIAGNOSIS — R05 Cough: Secondary | ICD-10-CM | POA: Diagnosis not present

## 2016-04-27 DIAGNOSIS — R350 Frequency of micturition: Secondary | ICD-10-CM | POA: Diagnosis not present

## 2016-05-11 ENCOUNTER — Ambulatory Visit (INDEPENDENT_AMBULATORY_CARE_PROVIDER_SITE_OTHER)
Admission: RE | Admit: 2016-05-11 | Discharge: 2016-05-11 | Disposition: A | Discharge status: Home / Self Care | Payer: PPO | Source: Ambulatory Visit | Attending: Adult Health | Admitting: Adult Health

## 2016-05-11 ENCOUNTER — Encounter: Payer: Self-pay | Admitting: Adult Health

## 2016-05-11 ENCOUNTER — Ambulatory Visit (INDEPENDENT_AMBULATORY_CARE_PROVIDER_SITE_OTHER): Payer: PPO | Admitting: Adult Health

## 2016-05-11 VITALS — BP 130/70 | HR 73 | Ht 74.0 in | Wt 161.6 lb

## 2016-05-11 DIAGNOSIS — J449 Chronic obstructive pulmonary disease, unspecified: Secondary | ICD-10-CM

## 2016-05-11 DIAGNOSIS — J189 Pneumonia, unspecified organism: Secondary | ICD-10-CM

## 2016-05-11 DIAGNOSIS — R05 Cough: Secondary | ICD-10-CM | POA: Diagnosis not present

## 2016-05-11 MED ORDER — BUDESONIDE-FORMOTEROL FUMARATE 160-4.5 MCG/ACT IN AERO: 2.0000 | INHALATION_SPRAY | Freq: Two times a day (BID) | RESPIRATORY_TRACT | 0 refills | Status: DC

## 2016-05-11 NOTE — Patient Instructions (Signed)
Continue on Symbicort Twice daily  , rinse after use.  Follow up in office in 4-6 months  With Dr. Melvyn Novas  and As needed

## 2016-05-11 NOTE — Assessment & Plan Note (Signed)
Recent flare now resolved   Plan  Patient Instructions  Continue on Symbicort Twice daily  , rinse after use.  Follow up in office in 4-6 months  With Dr. Melvyn Novas  and As needed

## 2016-05-11 NOTE — Progress Notes (Signed)
@Patient  ID: [name], male    DOB: 12/12/1934, 82 y.o.   MRN: [id number]  Chief Complaint  Patient presents with  . Follow-up    COPD     Referring provider: [place] Chard, MD  HPI: 67 male former smoker followed for COPD GOLD II w/ reversibility    05/11/2016 Follow up ; COPD/PNA  Pt presents for 1 month follow up  Seen last ov for COPD flare with CXR LLL opacity tx w/ abx and steroids.Marland Kitchen He is feeling better. CXR today shows clearance . CXR is clear.  He remains on Symbicort Twice daily  .  Denies fever, chest pain or edema.      No Known Allergies  Immunization History  Administered Date(s) Administered  . Influenza Whole 12/25/2010  . Influenza, High Dose Seasonal PF 12/28/2015  . Influenza,inj,Quad PF,36+ Mos 01/27/2014, 12/22/2014  . Influenza-Unspecified 11/24/2012  . Pneumococcal Conjugate-13 09/23/2013  . Pneumococcal-Unspecified 09/04/1999    Past Medical History:  Diagnosis Date  . Alzheimer's dementia 2013  . Anemia    HX OF ANEMIA  . CHF (congestive heart failure) ([place])   . Colon cancer ([place])   . COPD (chronic obstructive pulmonary disease) ([place])   . Depression   . Dyspnea   . Hyperlipidemia   . Hypertension   . Pneumonia 2014  . PVD (peripheral vascular disease) ([place])   . Type II diabetes mellitus ([facility])     Tobacco History: History  Smoking Status  . Former Smoker  . Packs/day: 0.30  . Years: 40.00  . Types: Cigarettes  . Quit date: 03/26/1993  Smokeless Tobacco  . Never Used   Counseling given: Not Answered   Outpatient Encounter Prescriptions as of 05/11/2016  Medication Sig  . albuterol (PROVENTIL HFA;VENTOLIN HFA) 108 (90 BASE) MCG/ACT inhaler Inhale 2 puffs into the lungs every 6 (six) hours as needed for shortness of breath.  Marland Kitchen albuterol (PROVENTIL) (2.5 MG/3ML) 0.083% nebulizer solution Inhale 3 mLs into the lungs every 6 (six) hours as needed for wheezing or shortness of breath.   Marland Kitchen aspirin 81 MG tablet Take 81 mg by  mouth every morning.   . budesonide-formoterol (SYMBICORT) 160-4.5 MCG/ACT inhaler Inhale 2 puffs into the lungs 2 (two) times daily.  . Cholecalciferol (VITAMIN D) 2000 UNITS CAPS Take 2,000 Units by mouth at bedtime.   . clopidogrel (PLAVIX) 75 MG tablet Take 1 tablet (75 mg total) by mouth daily.  . digoxin (LANOXIN) 0.125 MG tablet Take 0.125 mg by mouth every morning.   . furosemide (LASIX) 20 MG tablet Take 1 tablet (20 mg total) by mouth daily. (Patient taking differently: Take 20 mg by mouth every morning. )  . metoprolol tartrate (LOPRESSOR) 25 MG tablet Take 0.5 tablets (12.5 mg total) by mouth 2 (two) times daily.  . Multiple Vitamins-Minerals (CENTRUM SILVER PO) Take 1 tablet by mouth daily.   . pravastatin (PRAVACHOL) 40 MG tablet Take 40 mg by mouth every evening.   . TRADJENTA 5 MG TABS tablet Take 5 mg by mouth daily.   . traZODone (DESYREL) 50 MG tablet Take 50 mg by mouth at bedtime as needed for sleep.   No facility-administered encounter medications on file as of 05/11/2016.      Review of Systems  Constitutional:   No  weight loss, night sweats,  Fevers, chills, fatigue, or  lassitude.  HEENT:   No headaches,  Difficulty swallowing,  Tooth/dental problems, or  Sore throat,  No sneezing, itching, ear ache,  +nasal congestion, post nasal drip,   CV:  No chest pain,  Orthopnea, PND, swelling in lower extremities, anasarca, dizziness, palpitations, syncope.   GI  No heartburn, indigestion, abdominal pain, nausea, vomiting, diarrhea, change in bowel habits, loss of appetite, bloody stools.   Resp:    No wheezing.  No chest wall deformity  Skin: no rash or lesions.  GU: no dysuria, change in color of urine, no urgency or frequency.  No flank pain, no hematuria   MS:  No joint pain or swelling.  No decreased range of motion.  No back pain.    Physical Exam  BP 130/70 (BP Location: Right Arm, Patient Position: Sitting, Cuff Size: Normal)   Pulse 73    Ht 6\' 2"  (1.88 m)   Wt 161 lb 9.6 oz (73.3 kg)   SpO2 99%   BMI 20.75 kg/m   GEN: A/Ox3; pleasant , NAD, elderly    HEENT:  [place]/AT,  EACs-clear, TMs-wnl, NOSE-clear, THROAT-clear, no lesions, no postnasal drip or exudate noted.   NECK:  Supple w/ fair ROM; no JVD; normal carotid impulses w/o bruits; no thyromegaly or nodules palpated; no lymphadenopathy.    RESP  Decreased BS in bases ,  no accessory muscle use, no dullness to percussion  CARD:  RRR, no m/r/g, no peripheral edema, pulses intact, no cyanosis or clubbing.  GI:   Soft & nt; nml bowel sounds; no organomegaly or masses detected.   Musco: Warm bil, no deformities or joint swelling noted.   Neuro: alert, no focal deficits noted.    Skin: Warm, no lesions or rashes    Lab Results:  CBC Imaging: Dg Chest 2 View  Result Date: 05/11/2016 CLINICAL DATA:  Routine follow-up pneumonia.  Cough and congestion. EXAM: CHEST  2 VIEW COMPARISON:  03/28/2016. FINDINGS: Lung fields are now clear. Hyperinflation representing COPD redemonstrated. Normal cardiomediastinal silhouette. Aortic atherosclerosis. Bones unremarkable. IMPRESSION: COPD.  Lungs now clear. Electronically Signed   By: Staci Righter M.D.   On: 05/11/2016 16:26     Assessment & Plan:   No problem-specific Assessment & Plan notes found for this encounter.     Rexene Edison, NP 05/11/2016

## 2016-05-12 NOTE — Progress Notes (Signed)
Chart and office note reviewed in detail  > agree with a/p as outlined    

## 2016-06-07 DIAGNOSIS — E1122 Type 2 diabetes mellitus with diabetic chronic kidney disease: Secondary | ICD-10-CM | POA: Diagnosis not present

## 2016-06-07 DIAGNOSIS — N182 Chronic kidney disease, stage 2 (mild): Secondary | ICD-10-CM | POA: Diagnosis not present

## 2016-06-07 DIAGNOSIS — I1310 Hypertensive heart and chronic kidney disease without heart failure, with stage 1 through stage 4 chronic kidney disease, or unspecified chronic kidney disease: Secondary | ICD-10-CM | POA: Diagnosis not present

## 2016-06-07 DIAGNOSIS — N08 Glomerular disorders in diseases classified elsewhere: Secondary | ICD-10-CM | POA: Diagnosis not present

## 2016-06-28 ENCOUNTER — Encounter: Payer: Self-pay | Admitting: Family

## 2016-07-09 ENCOUNTER — Ambulatory Visit (INDEPENDENT_AMBULATORY_CARE_PROVIDER_SITE_OTHER): Payer: Self-pay | Admitting: Family

## 2016-07-09 ENCOUNTER — Ambulatory Visit (HOSPITAL_COMMUNITY)
Admission: RE | Admit: 2016-07-09 | Discharge: 2016-07-09 | Disposition: A | Discharge status: Home / Self Care | Payer: PPO | Source: Ambulatory Visit | Attending: Family | Admitting: Family

## 2016-07-09 ENCOUNTER — Ambulatory Visit (INDEPENDENT_AMBULATORY_CARE_PROVIDER_SITE_OTHER)
Admission: RE | Admit: 2016-07-09 | Discharge: 2016-07-09 | Disposition: A | Discharge status: Home / Self Care | Payer: PPO | Source: Ambulatory Visit | Attending: Family | Admitting: Family

## 2016-07-09 ENCOUNTER — Encounter: Payer: Self-pay | Admitting: Family

## 2016-07-09 VITALS — BP 161/74 | HR 49 | Temp 97.8°F | Resp 20 | Ht 74.0 in | Wt 164.7 lb

## 2016-07-09 DIAGNOSIS — I739 Peripheral vascular disease, unspecified: Secondary | ICD-10-CM

## 2016-07-09 DIAGNOSIS — Z9862 Peripheral vascular angioplasty status: Secondary | ICD-10-CM | POA: Diagnosis not present

## 2016-07-09 DIAGNOSIS — Z95828 Presence of other vascular implants and grafts: Secondary | ICD-10-CM

## 2016-07-09 DIAGNOSIS — I70213 Atherosclerosis of native arteries of extremities with intermittent claudication, bilateral legs: Secondary | ICD-10-CM

## 2016-07-09 DIAGNOSIS — Z87891 Personal history of nicotine dependence: Secondary | ICD-10-CM

## 2016-07-09 DIAGNOSIS — Z86718 Personal history of other venous thrombosis and embolism: Secondary | ICD-10-CM

## 2016-07-09 DIAGNOSIS — I771 Stricture of artery: Secondary | ICD-10-CM

## 2016-07-09 NOTE — Progress Notes (Signed)
Postoperative Visit   History of Present Illness  [name] is a 82 y.o. male who is s/p right external iliac artery stent placement on 04-24-16 by Dr. Trula Slade. At his surveillance ultrasound study in December 2017 he was found to have elevated velocities within the external iliac artery on the right with decreased ABIs bilaterally.  He also has a history of multiple lower extremity revascularization procedures including a right to left femoral-femoral bypass graft, bilateral femoral-popliteal bypass grafts, and a right common iliac stent in November 2017.  Wife states his feet are less cold since the last procedure.   Wife states that pt just does not want to walk other than for ADL's, he does not seem to elicit claudication symptoms, but wife states she is walking with him daily to get him to walk, she states he uses his walker to sit when he gets tired.   Wife states his breathing is slightly worse with cough in the last couple of days.   Wife states pt has Alzheimer's Disease which has worsened. She is is caregiver, she does have help in that a person comes in their home to help him dress every morning.   Dr. Einar Gip is pt's cardiologist, wife states pt has CHF, denies any known history of MI.  Wife also denies any known history of pt having a stroke or TIA.  Pt Diabetic: "prediabetic" Pt smoker: former smoker, quit in the 1990's  Pt meds include: Statin :Yes Betablocker: Yes ASA: Yes Other anticoagulants/antiplatelets: Plavix   For VQI Use Only  PRE-ADM LIVING: Home  AMB STATUS: Ambulatory   Past Medical History:  Diagnosis Date  . Alzheimer's dementia 2013  . Anemia    HX OF ANEMIA  . CHF (congestive heart failure) ([place])   . Colon cancer ([place])   . COPD (chronic obstructive pulmonary disease) ([place])   . Depression   . Dyspnea   . Hyperlipidemia   . Hypertension   . Pneumonia 2014  . PVD (peripheral vascular disease) ([place])   . Type II diabetes  mellitus ([place])     Past Surgical History:  Procedure Laterality Date  . ABDOMINAL ANGIOGRAM N/A 04/20/2013   Procedure: ABDOMINAL ANGIOGRAM;  Surgeon: Angelia Mould, MD;  Location: [facility] CATH LAB;  Service: Cardiovascular;  Laterality: N/A;  . CATARACT EXTRACTION Right 02/2013  . CATARACT EXTRACTION W/ INTRAOCULAR LENS IMPLANT Left 01/2013  . COLON RESECTION  1997   /enc. notes 05/17/2004 (04/28/2013)  . COLON SURGERY  1996   cancer  . ESOPHAGOGASTRODUODENOSCOPY N/A 09/05/2013   Procedure: ESOPHAGOGASTRODUODENOSCOPY (EGD);  Surgeon: Lear Ng, MD;  Location: [facility] ENDOSCOPY;  Service: Endoscopy;  Laterality: N/A;  . FEMORAL ARTERY - FEMORAL ARTERY BYPASS GRAFT     right to left/enc. notes 05/17/2004 (04/28/2013)  . FEMORAL BYPASS Right    /enc. notes 05/17/2004 (04/28/2013)  . FEMORAL-POPLITEAL BYPASS GRAFT Right 04/29/2013   Procedure:  FEMORAL-POPLITEAL ARTERY Bypass Graft with intraoperative ultrasound and arteriogarm;  Surgeon: Mal Misty, MD;  Location: [place];  Service: Vascular;  Laterality: Right;  . FIBEROPTIC BRONCHOSCOPY     Yvette Rack. notes 03/16/2005 (04/28/2013)  . INTRAOPERATIVE ARTERIOGRAM Right 04/29/2013   Procedure: INTRA OPERATIVE ARTERIOGRAM;  Surgeon: Mal Misty, MD;  Location: [place];  Service: Vascular;  Laterality: Right;  . LOWER EXTREMITY ANGIOGRAM  04/20/2013   Procedure: LOWER EXTREMITY ANGIOGRAM;  Surgeon: Angelia Mould, MD;  Location: [facility] CATH LAB;  Service: Cardiovascular;;  . LUMBAR [place] SURGERY  2005  .  PERCUTANEOUS STENT INTERVENTION  04/20/2013   Procedure: PERCUTANEOUS STENT INTERVENTION;  Surgeon: Angelia Mould, MD;  [facility] CATH LAB;  Service: Cardiovascular;;  right common iliac artery  . PERIPHERAL VASCULAR CATHETERIZATION N/A 01/31/2016   Procedure: Abdominal Aortogram w/Lower Extremity;  Surgeon: Serafina Mitchell, MD;  Location: [place] CV LAB;  Service: Cardiovascular;  Laterality: N/A;  . PERIPHERAL VASCULAR CATHETERIZATION  Right 01/31/2016   Procedure: Peripheral Vascular Atherectomy;  Surgeon: Serafina Mitchell, MD;  Location: [place] CV LAB;  Service: Cardiovascular;  Laterality: Right;  Iliac common and external  . PERIPHERAL VASCULAR CATHETERIZATION Right 01/31/2016   Procedure: Peripheral Vascular Intervention;  Surgeon: Serafina Mitchell, MD;  Location: [place] CV LAB;  Service: Cardiovascular;  Laterality: Right;  external iliac and common iliac  . PERIPHERAL VASCULAR CATHETERIZATION N/A 04/24/2016   Procedure: Abdominal Aortogram w/Lower Extremity;  Surgeon: Serafina Mitchell, MD;  Location: [place] CV LAB;  Service: Cardiovascular;  Laterality: N/A;  . PERIPHERAL VASCULAR CATHETERIZATION Right 04/24/2016   Procedure: Peripheral Vascular Intervention;  Surgeon: Serafina Mitchell, MD;  Location: [place] CV LAB;  Service: Cardiovascular;  Laterality: Right;  external illiac Rt  . SPINE SURGERY    . VASCULAR SURGERY      Social History   Social History  . Marital status: Married    Spouse name: N/A  . Number of children: N/A  . Years of education: N/A   Occupational History  . Not on file.   Social History Main Topics  . Smoking status: Former Smoker    Packs/day: 0.30    Years: 40.00    Types: Cigarettes    Quit date: 03/26/1993  . Smokeless tobacco: Never Used  . Alcohol use No  . Drug use: No  . Sexual activity: No   Other Topics Concern  . Not on file   Social History Narrative  . No narrative on file   No Known Allergies Current Outpatient Prescriptions on File Prior to Visit  Medication Sig Dispense Refill  . albuterol (PROVENTIL HFA;VENTOLIN HFA) 108 (90 BASE) MCG/ACT inhaler Inhale 2 puffs into the lungs every 6 (six) hours as needed for shortness of breath. 1 Inhaler 5  . albuterol (PROVENTIL) (2.5 MG/3ML) 0.083% nebulizer solution Inhale 3 mLs into the lungs every 6 (six) hours as needed for wheezing or shortness of breath.     Marland Kitchen aspirin 81 MG tablet Take 81 mg by mouth  every morning.     . budesonide-formoterol (SYMBICORT) 160-4.5 MCG/ACT inhaler Inhale 2 puffs into the lungs 2 (two) times daily. 2 Inhaler 0  . Cholecalciferol (VITAMIN D) 2000 UNITS CAPS Take 2,000 Units by mouth at bedtime.     . clopidogrel (PLAVIX) 75 MG tablet Take 1 tablet (75 mg total) by mouth daily. 30 tablet 11  . digoxin (LANOXIN) 0.125 MG tablet Take 0.125 mg by mouth every morning.     . furosemide (LASIX) 20 MG tablet Take 1 tablet (20 mg total) by mouth daily. (Patient taking differently: Take 20 mg by mouth every morning. ) 5 tablet 0  . metoprolol tartrate (LOPRESSOR) 25 MG tablet Take 0.5 tablets (12.5 mg total) by mouth 2 (two) times daily. 30 tablet 0  . Multiple Vitamins-Minerals (CENTRUM SILVER PO) Take 1 tablet by mouth daily.     . pravastatin (PRAVACHOL) 40 MG tablet Take 40 mg by mouth every evening.     . TRADJENTA 5 MG TABS tablet Take 5 mg by mouth daily.     Marland Kitchen  traZODone (DESYREL) 50 MG tablet Take 50 mg by mouth at bedtime as needed for sleep.     No current facility-administered medications on file prior to visit.      Physical Examination  Vitals:   07/09/16 0929 07/09/16 0931  BP: (!) 158/79 (!) 161/74  Pulse: (!) 49   Resp: 20   Temp: 97.8 F (36.6 C)   TempSrc: Oral   SpO2: 100%   Weight: 164 lb 11.2 oz (74.7 kg)   Height: 6\' 2"  (1.88 m)    Body mass index is 21.15 kg/m.  PHYSICAL EXAMINATION: General: General: A&O to person only, WDWN. Gait: slow, deliberate  Eyes: Pupils are equal Pulmonary: Respirations are non labored at rest, + moist cough, + rales in left posterior fields, no wheezes or rhonchi, limited air movement in left posterior fields. Cardiac: regular rhythm, no detected murmur.     Carotid Bruits Right Left   Negative Negative  Aorta is not palpable. Radial pulses: are palpable   VASCULAR EXAM: Extremitieswithout ischemic changes, without gangrene; without open wounds. No peripheral  edema. Fem to fem bypass graft is palpable.     LE Pulses Right Left   FEMORAL 2+ palpable 2+ palpable    POPLITEAL not palpable  not palpable   POSTERIOR TIBIAL not palpable  not palpable    DORSALIS PEDIS  ANTERIOR TIBIAL not palpable  not palpable    Abdomen: soft, NT, no palpable masses. Skin: no rashes, no ulcers. Musculoskeletal: no muscle wasting or atrophy. Neurologic: A&O X 1; Appropriate      Medical Decision Making  [name] is a 82 y.o. male who presents s/p  right external iliac artery stent placement on 04-24-16 by Dr. Trula Slade. He also has a history of multiple lower extremity revascularization procedures including a right to left femoral-femoral bypass graft, bilateral femoral-popliteal bypass grafts, and a right common iliac stent in November 2017.  Wife states his feet are less cold since the last vascular procedure. Right iliac stent velocities today are all less than 274 cm/s, improved from a distal stent pre procedure velocity of over 500 cm/c.  Fem-fem bypass graft duplex highest velocity today is 223 cm/c. In the bilateral femoropopliteal bypass grafts there is no color or Spectral Doppler flow. Right femoropopliteal bypass graft was was patent on exam of 01-16-16, and was patent on the arteriogram on 01-31-16. Left femoropopliteal bypass graft was noted to be occluded on the 01-16-16 duplex.   I discussed the occlusion of both femoropopliteal bypass grafts with Dr. Trula Slade. Pt has no symptoms of claudication and no signs of ischemia in his feet or legs. In fact, wife states pt's feet are less cold since the last procedure on 04-24-16. Pt does not ambulate much at his choice, though wife tries to get him to walk with her as much as possible. Pt also has advanced  dementia.  Pt has had a silent occlusion of both femoropopliteal bypass grafts, is asymptomatic.   Wife reports pt has had a cough for the last couple of days. Pt has rales and limited air movement in left posterior lung fields. Wife states she will notify pt's pulmonologist or PCP about this. She states she will also ask his PCP for a referral to a podiatrist to trim his thick toenails.   Daily seated leg exercises demonstrated and dicussed with pt and wife. Wife will continue to encourage pt to walk with her.    Based on his angiographic findings, this patient is advised  to return in 3 months for ABI's, right iliac artery stent duplex, and fem-fem bypass graft duplex.  I discussed in depth with the patient the nature of atherosclerosis, and emphasized the importance of maximal medical management including strict control of blood pressure, blood glucose, and lipid levels, obtaining regular exercise, and continued cessation of smoking.  The patient is aware that without maximal medical management the underlying atherosclerotic disease process will progress, limiting the benefit of any interventions. The patient is currently on a statin. The patient is currently on an anti-platelet:  ASA 81 mg and Plavix.   Thank you for allowing [place] to participate in this patient's care.  NICKEL, Sharmon Leyden, RN, MSN, FNP-C [facility] Office: [phone]  07/09/2016, 9:34 AM  Clinic MD: Trula Slade

## 2016-07-09 NOTE — Patient Instructions (Signed)

## 2016-07-10 NOTE — Addendum Note (Signed)
Addended by: Lianne Cure A on: 07/10/2016 09:12 AM   Modules accepted: Orders

## 2016-07-19 DIAGNOSIS — H35372 Puckering of macula, left eye: Secondary | ICD-10-CM | POA: Diagnosis not present

## 2016-07-19 DIAGNOSIS — Z961 Presence of intraocular lens: Secondary | ICD-10-CM | POA: Diagnosis not present

## 2016-07-19 DIAGNOSIS — E119 Type 2 diabetes mellitus without complications: Secondary | ICD-10-CM | POA: Diagnosis not present

## 2016-07-19 DIAGNOSIS — H01134 Eczematous dermatitis of left upper eyelid: Secondary | ICD-10-CM | POA: Diagnosis not present

## 2016-07-24 ENCOUNTER — Ambulatory Visit: Payer: Commercial Managed Care - HMO | Admitting: Internal Medicine

## 2016-07-31 ENCOUNTER — Encounter (HOSPITAL_COMMUNITY): Payer: Commercial Managed Care - HMO

## 2016-07-31 ENCOUNTER — Ambulatory Visit: Payer: Commercial Managed Care - HMO | Admitting: Family

## 2016-07-31 ENCOUNTER — Other Ambulatory Visit (HOSPITAL_COMMUNITY): Payer: Commercial Managed Care - HMO

## 2016-08-27 ENCOUNTER — Encounter (HOSPITAL_COMMUNITY): Payer: Self-pay

## 2016-08-27 ENCOUNTER — Other Ambulatory Visit (HOSPITAL_COMMUNITY): Payer: Self-pay

## 2016-08-27 ENCOUNTER — Encounter (HOSPITAL_COMMUNITY): Payer: PPO

## 2016-08-27 ENCOUNTER — Ambulatory Visit: Payer: Self-pay | Admitting: Family

## 2016-08-31 ENCOUNTER — Telehealth: Payer: Self-pay | Admitting: Internal Medicine

## 2016-08-31 MED ORDER — BUDESONIDE-FORMOTEROL FUMARATE 160-4.5 MCG/ACT IN AERO: 2.0000 | INHALATION_SPRAY | Freq: Two times a day (BID) | RESPIRATORY_TRACT | 0 refills | Status: DC

## 2016-08-31 NOTE — Telephone Encounter (Signed)
Spoke with pt's wife. Pt is needing samples of Symbicort. Samples have been left up front. Nothing further was needed.

## 2016-09-28 ENCOUNTER — Encounter: Payer: Self-pay | Admitting: Family

## 2016-10-10 ENCOUNTER — Ambulatory Visit (HOSPITAL_COMMUNITY)
Admission: RE | Admit: 2016-10-10 | Discharge: 2016-10-10 | Disposition: A | Discharge status: Home / Self Care | Payer: PPO | Source: Ambulatory Visit | Attending: Surgery | Admitting: Surgery

## 2016-10-10 ENCOUNTER — Encounter: Payer: Self-pay | Admitting: Family

## 2016-10-10 ENCOUNTER — Ambulatory Visit (INDEPENDENT_AMBULATORY_CARE_PROVIDER_SITE_OTHER): Payer: PPO | Admitting: Family

## 2016-10-10 ENCOUNTER — Ambulatory Visit (INDEPENDENT_AMBULATORY_CARE_PROVIDER_SITE_OTHER)
Admission: RE | Admit: 2016-10-10 | Discharge: 2016-10-10 | Disposition: A | Discharge status: Home / Self Care | Payer: PPO | Source: Ambulatory Visit | Attending: Surgery | Admitting: Surgery

## 2016-10-10 VITALS — BP 182/79 | HR 51 | Temp 97.0°F | Resp 16 | Ht 74.0 in | Wt 167.9 lb

## 2016-10-10 DIAGNOSIS — J449 Chronic obstructive pulmonary disease, unspecified: Secondary | ICD-10-CM | POA: Diagnosis not present

## 2016-10-10 DIAGNOSIS — I129 Hypertensive chronic kidney disease with stage 1 through stage 4 chronic kidney disease, or unspecified chronic kidney disease: Secondary | ICD-10-CM | POA: Diagnosis not present

## 2016-10-10 DIAGNOSIS — I10 Essential (primary) hypertension: Secondary | ICD-10-CM | POA: Insufficient documentation

## 2016-10-10 DIAGNOSIS — Z95828 Presence of other vascular implants and grafts: Secondary | ICD-10-CM | POA: Diagnosis not present

## 2016-10-10 DIAGNOSIS — I779 Disorder of arteries and arterioles, unspecified: Secondary | ICD-10-CM | POA: Diagnosis not present

## 2016-10-10 DIAGNOSIS — I70213 Atherosclerosis of native arteries of extremities with intermittent claudication, bilateral legs: Secondary | ICD-10-CM | POA: Diagnosis not present

## 2016-10-10 DIAGNOSIS — Z86718 Personal history of other venous thrombosis and embolism: Secondary | ICD-10-CM | POA: Insufficient documentation

## 2016-10-10 DIAGNOSIS — E785 Hyperlipidemia, unspecified: Secondary | ICD-10-CM | POA: Insufficient documentation

## 2016-10-10 DIAGNOSIS — E1122 Type 2 diabetes mellitus with diabetic chronic kidney disease: Secondary | ICD-10-CM | POA: Diagnosis not present

## 2016-10-10 DIAGNOSIS — I771 Stricture of artery: Secondary | ICD-10-CM

## 2016-10-10 DIAGNOSIS — N08 Glomerular disorders in diseases classified elsewhere: Secondary | ICD-10-CM | POA: Diagnosis not present

## 2016-10-10 DIAGNOSIS — Z87891 Personal history of nicotine dependence: Secondary | ICD-10-CM

## 2016-10-10 DIAGNOSIS — N182 Chronic kidney disease, stage 2 (mild): Secondary | ICD-10-CM | POA: Diagnosis not present

## 2016-10-10 NOTE — Progress Notes (Signed)
[facility] [place]   CC: Follow up peripheral artery occlusive disease  History of Present Illness [name] is a 82 y.o. male who is s/p right external iliac artery stent placement on 04-24-16 by Dr. Trula Slade. At his surveillance ultrasound study in December 2017 he was found to have elevated velocities within the external iliac artery on the right with decreased ABIs bilaterally.  He also has a history of multiple lower extremity revascularization procedures including a right to left femoral-femoral bypass graft, bilateral femoral-popliteal bypass grafts, and a right common iliac stent in November 2017.  Wife states his feet are less cold since the last procedure.   Wife states that pt just does not want to walk other than for ADL's, he does not seem to elicit claudication symptoms, but wife states she is walking with him daily to get him to walk, she states he uses his walker to sit when he gets tired.   Wife states his breathing is slightly worse with cough in the last couple of days.   Wife states pt has Alzheimer's Disease which has worsened. She is is caregiver, she does have help in that a person comes in their home to help him dress every morning.   Dr. Einar Gip is pt's cardiologist, wife states pt has CHF, denies any known history of MI.  Wife also denies any known history of pt having a stroke or TIA.  Pt Diabetic: "prediabetic" Pt smoker: former smoker, quit in the 1990's  Pt meds include: Statin :Yes Betablocker: Yes ASA: Yes Other anticoagulants/antiplatelets: Plavix   Past Medical History:  Diagnosis Date  . Alzheimer's dementia 2013  . Anemia    HX OF ANEMIA  . CHF (congestive heart failure) ([place])   . Colon cancer ([place])   . COPD (chronic obstructive pulmonary disease) ([place])   . Depression   . Dyspnea   . Hyperlipidemia   . Hypertension   . Pneumonia 2014  . PVD (peripheral vascular disease) ([place])   . Type II diabetes  mellitus ([place])     Social History Social History  Substance Use Topics  . Smoking status: Former Smoker    Packs/day: 0.30    Years: 40.00    Types: Cigarettes    Quit date: 03/26/1993  . Smokeless tobacco: Never Used  . Alcohol use No    Family History Family History  Problem Relation Age of Onset  . Stroke Mother 47  . Hypertension Mother   . Cancer Sister   . Heart disease Sister   . Heart disease Brother        Heart Disease before age 16  . Heart attack Brother   . Cancer Sister   . Cancer Brother     Past Surgical History:  Procedure Laterality Date  . ABDOMINAL ANGIOGRAM N/A 04/20/2013   Procedure: ABDOMINAL ANGIOGRAM;  Surgeon: Angelia Mould, MD;  Location: [facility] CATH LAB;  Service: Cardiovascular;  Laterality: N/A;  . CATARACT EXTRACTION Right 02/2013  . CATARACT EXTRACTION W/ INTRAOCULAR LENS IMPLANT Left 01/2013  . COLON RESECTION  1997   /enc. notes 05/17/2004 (04/28/2013)  . COLON SURGERY  1996   cancer  . ESOPHAGOGASTRODUODENOSCOPY N/A 09/05/2013   Procedure: ESOPHAGOGASTRODUODENOSCOPY (EGD);  Surgeon: Lear Ng, MD;  Location: [facility] ENDOSCOPY;  Service: Endoscopy;  Laterality: N/A;  . FEMORAL ARTERY - FEMORAL ARTERY BYPASS GRAFT     right to left/enc. notes 05/17/2004 (04/28/2013)  . FEMORAL BYPASS Right    /enc. notes 05/17/2004 (  04/28/2013)  . FEMORAL-POPLITEAL BYPASS GRAFT Right 04/29/2013   Procedure:  FEMORAL-POPLITEAL ARTERY Bypass Graft with intraoperative ultrasound and arteriogarm;  Surgeon: Mal Misty, MD;  Location: [place];  Service: Vascular;  Laterality: Right;  . FIBEROPTIC BRONCHOSCOPY     Yvette Rack. notes 03/16/2005 (04/28/2013)  . INTRAOPERATIVE ARTERIOGRAM Right 04/29/2013   Procedure: INTRA OPERATIVE ARTERIOGRAM;  Surgeon: Mal Misty, MD;  Location: [place];  Service: Vascular;  Laterality: Right;  . LOWER EXTREMITY ANGIOGRAM  04/20/2013   Procedure: LOWER EXTREMITY ANGIOGRAM;  Surgeon: Angelia Mould, MD;  Location: [facility] CATH LAB;   Service: Cardiovascular;;  . LUMBAR [place] SURGERY  2005  . PERCUTANEOUS STENT INTERVENTION  04/20/2013   Procedure: PERCUTANEOUS STENT INTERVENTION;  Surgeon: Angelia Mould, MD;  Location: [facility] CATH LAB;  Service: Cardiovascular;;  right common iliac artery  . PERIPHERAL VASCULAR CATHETERIZATION N/A 01/31/2016   Procedure: Abdominal Aortogram w/Lower Extremity;  Surgeon: Serafina Mitchell, MD;  Location: [place] CV LAB;  Service: Cardiovascular;  Laterality: N/A;  . PERIPHERAL VASCULAR CATHETERIZATION Right 01/31/2016   Procedure: Peripheral Vascular Atherectomy;  Surgeon: Serafina Mitchell, MD;  Location: [place] CV LAB;  Service: Cardiovascular;  Laterality: Right;  Iliac common and external  . PERIPHERAL VASCULAR CATHETERIZATION Right 01/31/2016   Procedure: Peripheral Vascular Intervention;  Surgeon: Serafina Mitchell, MD;  Location: [place] CV LAB;  Service: Cardiovascular;  Laterality: Right;  external iliac and common iliac  . PERIPHERAL VASCULAR CATHETERIZATION N/A 04/24/2016   Procedure: Abdominal Aortogram w/Lower Extremity;  Surgeon: Serafina Mitchell, MD;  Location: [place] CV LAB;  Service: Cardiovascular;  Laterality: N/A;  . PERIPHERAL VASCULAR CATHETERIZATION Right 04/24/2016   Procedure: Peripheral Vascular Intervention;  Surgeon: Serafina Mitchell, MD;  Location: [place] CV LAB;  Service: Cardiovascular;  Laterality: Right;  external illiac Rt  . SPINE SURGERY    . VASCULAR SURGERY      No Known Allergies  Current Outpatient Prescriptions  Medication Sig Dispense Refill  . albuterol (PROVENTIL HFA;VENTOLIN HFA) 108 (90 BASE) MCG/ACT inhaler Inhale 2 puffs into the lungs every 6 (six) hours as needed for shortness of breath. 1 Inhaler 5  . albuterol (PROVENTIL) (2.5 MG/3ML) 0.083% nebulizer solution Inhale 3 mLs into the lungs every 6 (six) hours as needed for wheezing or shortness of breath.     Marland Kitchen aspirin 81 MG tablet Take 81 mg by mouth every morning.     .  budesonide-formoterol (SYMBICORT) 160-4.5 MCG/ACT inhaler Inhale 2 puffs into the lungs 2 (two) times daily. 2 Inhaler 0  . Cholecalciferol (VITAMIN D) 2000 UNITS CAPS Take 2,000 Units by mouth at bedtime.     . clopidogrel (PLAVIX) 75 MG tablet Take 1 tablet (75 mg total) by mouth daily. 30 tablet 11  . digoxin (LANOXIN) 0.125 MG tablet Take 0.125 mg by mouth every morning.     . furosemide (LASIX) 20 MG tablet Take 1 tablet (20 mg total) by mouth daily. (Patient taking differently: Take 20 mg by mouth every morning. ) 5 tablet 0  . metoprolol tartrate (LOPRESSOR) 25 MG tablet Take 0.5 tablets (12.5 mg total) by mouth 2 (two) times daily. 30 tablet 0  . Multiple Vitamins-Minerals (CENTRUM SILVER PO) Take 1 tablet by mouth daily.     . Omega-3 Fatty Acids (FISH OIL) 1000 MG CAPS Take by mouth.    . pravastatin (PRAVACHOL) 40 MG tablet Take 40 mg by mouth every evening.     . TRADJENTA 5 MG  TABS tablet Take 5 mg by mouth daily.      No current facility-administered medications for this visit.     ROS: See HPI for pertinent positives and negatives.   Physical Examination  Vitals:   10/10/16 1140 10/10/16 1144  BP: (!) 176/77 (!) 182/79  Pulse: (!) 51   Resp: 16   Temp: (!) 97 F (36.1 C)   TempSrc: Oral   SpO2: 100%   Weight: 167 lb 14.4 oz (76.2 kg)   Height: 6\' 2"  (1.88 m)    Body mass index is 21.56 kg/m.  General: A&O to person only, WDWN. Gait: slow, deliberate  Eyes: Pupils are equal Pulmonary: Respirations are non labored at rest, + moist cough, + rales in left base, few expiratory wheezes, possible rhonchi, limited air movement in all fields. Cardiac: regular rhythm, bradycardic, no detected murmur.     Carotid Bruits Right Left   Negative Negative  Aorta is not palpable. Radial pulses: are palpable   VASCULAR EXAM: Extremitieswith ischemic changes: slightly dusky left 2nd toe, without gangrene; without open wounds. No  peripheral edema. Fem to fem bypass graft is palpable.     LE Pulses Right Left   FEMORAL 1+ palpable 1+palpable    POPLITEAL not palpable  not palpable   POSTERIOR TIBIAL not palpable  not palpable    DORSALIS PEDIS  ANTERIOR TIBIAL not palpable  not palpable    Abdomen: soft, NT, no palpable masses. Skin: no rashes, no ulcers. Musculoskeletal: no muscle wasting or atrophy. Neurologic: A&O X 1; Responds to command appropriately.     ASSESSMENT: XAYVIER VALLEZ is a 82 y.o. male who presents s/p  right external iliac artery stent placement on 04-24-16 by Dr. Trula Slade. He also has a history of multiple lower extremity revascularization procedures including a right to left femoral-femoral bypass graft, bilateral femoral-popliteal bypass grafts, and a right common iliac stent in November 2017.  Wife states his feet are less cold since the last vascular procedure, and that his left 2nd toe is no darker than it was 3 months prior.   I discussed the occlusion of both femoropopliteal bypass grafts with Dr. Trula Slade at pt's 07-09-16 visit. Pt has no symptoms of claudication and no signs of ischemia in his feet or legs. In fact, wife states pt's feet are less cold since the last procedure on 04-24-16. Pt does not ambulate much at his choice, though wife tries to get him to walk with her as much as possible. Pt also has advanced dementia.  Pt has had a silent occlusion of both femoropopliteal bypass grafts, is asymptomatic.   Pt is to see his PCP this afternoon.    DATA  Right iliac artery stent duplex (10/10/16): Right CIA stent not visualized. Increased velocity in what appears to be the right mid (557 cm/s) to distal (492 cm/s) EIA stent (50-99% stenosis) as well as distal to the stent (501 cm/s)  in the native artery (50-99% stenosis), limited visualization due to body habitus and bowel gas.  Increased velocity in the mid to distal segment of the ECA stent.   Fem-fem bypass graft duplex (10/10/16): Patent right to left femoral to femoral bypass graft with increased velocity in the right inflow (410 cm/s). No prior exam.    ABI (Date: 10/10/2016)  R:   ABI: 0.45 (was 0.52 on 03-14-16, pre-op stent),   PT: mono  DP: mono  TBI:  0.28 (was 0.29)  L:   ABI: 0.44 (was 0.35),  PT: mono  DP: mono  TBI: 0.37 (was dampened)  PLAN:  Daily seated leg exercises demonstrated and dicussed with pt and wife. Wife will continue to encourage pt to walk with her.  Follow up with Dr. Trula Slade at his first available, 11-05-16, to discuss options to address his multiple peripheral artery stenoses, in this pt with fairly advanced dementia, with known silent asymptomatic occlusion of both femoropopliteal bypass grafts by his 07-09-16 visit.    I discussed in depth with the patient the nature of atherosclerosis, and emphasized the importance of maximal medical management including strict control of blood pressure, blood glucose, and lipid levels, obtaining regular exercise, and continued cessation of smoking.  The patient is aware that without maximal medical management the underlying atherosclerotic disease process will progress, limiting the benefit of any interventions.  The patient was given information about PAD including signs, symptoms, treatment, what symptoms should prompt the patient to seek immediate medical care, and risk reduction measures to take.  Edwin Chambers, RN, MSN, FNP-C [facility] Phone: [phone]  Clinic MD: Early on call  10/10/16 11:46 AM

## 2016-10-10 NOTE — Patient Instructions (Signed)

## 2016-10-16 ENCOUNTER — Encounter: Payer: Self-pay | Admitting: Family

## 2016-10-23 NOTE — Progress Notes (Signed)
Flu shot given at [place]. Charged in [place] encounter.

## 2016-11-05 ENCOUNTER — Encounter: Payer: Self-pay | Admitting: Surgery

## 2016-11-05 ENCOUNTER — Ambulatory Visit (INDEPENDENT_AMBULATORY_CARE_PROVIDER_SITE_OTHER): Payer: PPO | Admitting: Surgery

## 2016-11-05 VITALS — BP 183/77 | HR 52 | Temp 97.6°F | Resp 16 | Ht 69.0 in | Wt 165.0 lb

## 2016-11-05 DIAGNOSIS — I779 Disorder of arteries and arterioles, unspecified: Secondary | ICD-10-CM

## 2016-11-05 NOTE — Progress Notes (Signed)
 [facility] [place]  Patient name: [name] MRN: [id number] DOB: 05/16/1934 Sex: male   REASON FOR VISIT:    Follow up  HISOTRY OF PRESENT ILLNESS:    [name] is a 81 y.o. male returns today for follow-up.  He has a history of a right to left femoral-femoral bypass graft as well as bilateral femoral-popliteal bypass grafts, all done by Dr. Lawson.  In November 2017 he went for arteriogram and is status post atherectomy and drug coated balloon angioplasty of the right iliac artery and subsequent stenting of a nonflow limiting dissection.  Subsequent to this he came back for an ultrasound that showed a high-grade right external iliac velocities and decrease in ABIs.  On 04/24/2016 he underwent angiography and he was found to have significant stenosis in the external iliac artery below the previously deployed stent.  This was primarily stented.  The femoral-femoral bypass graft was widely patent  He was most recently seen by Suzanne in July 2018.  Ultrasound revealed silent occlusion of the right femoral-popliteal bypass graft.  He now has a silent occlusion of bilateral femoral-popliteal bypass grafts.  There was also a stenosis detected in his distal right external iliac artery.  According to the wife, his feet feel a little cooler than normal.  She is also concerned about 8 discoloration.  When she does taken out for a day trip, he will get pain in his legs.  He does not have any open wounds.   PAST MEDICAL HISTORY:   Past Medical History:  Diagnosis Date  . Alzheimer's dementia 2013  . Anemia    HX OF ANEMIA  . CHF (congestive heart failure) ([facility])   . Colon cancer ([facility])   . COPD (chronic obstructive pulmonary disease) ([facility])   . Depression   . Dyspnea   . Hyperlipidemia   . Hypertension   . Pneumonia 2014  . PVD (peripheral vascular disease) ([facility])   . Type II diabetes mellitus ([facility])      FAMILY HISTORY:    Family History  Problem Relation Age of Onset  . Stroke Mother 75  . Hypertension Mother   . Cancer Sister   . Heart disease Sister   . Heart disease Brother        Heart Disease before age 60  . Heart attack Brother   . Cancer Sister   . Cancer Brother     SOCIAL HISTORY:   Social History  Substance Use Topics  . Smoking status: Former Smoker    Packs/day: 0.30    Years: 40.00    Types: Cigarettes    Quit date: 03/26/1993  . Smokeless tobacco: Never Used  . Alcohol use No     ALLERGIES:   No Known Allergies   CURRENT MEDICATIONS:   Current Outpatient Prescriptions  Medication Sig Dispense Refill  . albuterol (PROVENTIL HFA;VENTOLIN HFA) 108 (90 BASE) MCG/ACT inhaler Inhale 2 puffs into the lungs every 6 (six) hours as needed for shortness of breath. 1 Inhaler 5  . albuterol (PROVENTIL) (2.5 MG/3ML) 0.083% nebulizer solution Inhale 3 mLs into the lungs every 6 (six) hours as needed for wheezing or shortness of breath.     . aspirin 81 MG tablet Take 81 mg by mouth every morning.     . budesonide-formoterol (SYMBICORT) 160-4.5 MCG/ACT inhaler Inhale 2 puffs into the lungs 2 (two) times daily. 2 Inhaler 0  . Cholecalciferol (VITAMIN D) 2000 UNITS CAPS Take 2,000 Units by mouth at   bedtime.     . clopidogrel (PLAVIX) 75 MG tablet Take 1 tablet (75 mg total) by mouth daily. 30 tablet 11  . digoxin (LANOXIN) 0.125 MG tablet Take 0.125 mg by mouth every morning.     . furosemide (LASIX) 20 MG tablet Take 1 tablet (20 mg total) by mouth daily. (Patient taking differently: Take 20 mg by mouth every morning. ) 5 tablet 0  . metoprolol tartrate (LOPRESSOR) 25 MG tablet Take 0.5 tablets (12.5 mg total) by mouth 2 (two) times daily. 30 tablet 0  . Multiple Vitamins-Minerals (CENTRUM SILVER PO) Take 1 tablet by mouth daily.     . Omega-3 Fatty Acids (FISH OIL) 1000 MG CAPS Take by mouth.    . pravastatin (PRAVACHOL) 40 MG tablet Take 40 mg by mouth every evening.     . TRADJENTA 5  MG TABS tablet Take 5 mg by mouth daily.      No current facility-administered medications for this visit.     REVIEW OF SYSTEMS:   [X] denotes positive finding, [ ] denotes negative finding Cardiac  Comments:  Chest pain or chest pressure:    Shortness of breath upon exertion:    Short of breath when lying flat:    Irregular heart rhythm:        Vascular    Pain in calf, thigh, or hip brought on by ambulation: x   Pain in feet at night that wakes you up from your sleep:     Blood clot in your veins:    Leg swelling:         Pulmonary    Oxygen at home:    Productive cough:     Wheezing:         Neurologic    Sudden weakness in arms or legs:     Sudden numbness in arms or legs:     Sudden onset of difficulty speaking or slurred speech:    Temporary loss of vision in one eye:     Problems with dizziness:         Gastrointestinal    Blood in stool:     Vomited blood:         Genitourinary    Burning when urinating:     Blood in urine:        Psychiatric    Major depression:         Hematologic    Bleeding problems:    Problems with blood clotting too easily:        Skin    Rashes or ulcers:        Constitutional    Fever or chills:      PHYSICAL EXAM:   Vitals:   11/05/16 1615  BP: (!) 183/77  Pulse: (!) 52  Resp: 16  Temp: 97.6 F (36.4 C)  TempSrc: Oral  SpO2: 96%  Weight: 165 lb (74.8 kg)  Height: 5' 9" (1.753 m)    GENERAL: The patient is a well-nourished male, in no acute distress. The vital signs are documented above. CARDIAC: There is a regular rate and rhythm.  VASCULAR: Nonpalpable pedal pulses. PULMONARY: Non-labored respirations ABDOMEN: Soft and non-tender with normal pitched bowel sounds.  MUSCULOSKELETAL: There are no major deformities or cyanosis. NEUROLOGIC: No focal weakness or paresthesias are detected. SKIN: There are no ulcers or rashes noted. PSYCHIATRIC: The patient has a normal affect.  STUDIES:   I have reviewed  the ultrasound study which shows a patent right to left   femoral-femoral bypass graft with increased velocities within the right inflow artery.  ABI on the right is 0.45.  On the left 0.44.  These are essentially unchanged.  MEDICAL ISSUES:   The patient has had silent occlusion of bilateral femoral popliteal arteries.  He has previously undergone stenting of the right iliac artery which is the inflow to his right to left femoral-femoral bypass graft.  Most recent ultrasound suggested worsening stenosis within the right distal external iliac artery.  I discussed with the patient that I feel that this puts his femoral-femoral graft at risk for failure.  After multiple lengthy discussions, we have decided to proceed with angiography.  This will be through a left brachial approach.  This will be scheduled for Tuesday in August.    Wells Karlisha Mathena, MD [facility] [place] Tel [phone] Pager [phone] 

## 2016-11-16 ENCOUNTER — Other Ambulatory Visit: Payer: Self-pay

## 2016-11-19 DIAGNOSIS — H6123 Impacted cerumen, bilateral: Secondary | ICD-10-CM | POA: Diagnosis not present

## 2016-11-20 ENCOUNTER — Ambulatory Visit (HOSPITAL_COMMUNITY)
Admission: RE | Admit: 2016-11-20 | Discharge: 2016-11-20 | Disposition: A | Discharge status: Home / Self Care | Payer: PPO | Source: Ambulatory Visit | Attending: Surgery | Admitting: Surgery

## 2016-11-20 ENCOUNTER — Encounter (HOSPITAL_COMMUNITY)
Admission: RE | Disposition: A | Discharge status: Home / Self Care | Payer: Self-pay | Source: Ambulatory Visit | Attending: Surgery

## 2016-11-20 DIAGNOSIS — E1151 Type 2 diabetes mellitus with diabetic peripheral angiopathy without gangrene: Secondary | ICD-10-CM | POA: Diagnosis not present

## 2016-11-20 DIAGNOSIS — G309 Alzheimer's disease, unspecified: Secondary | ICD-10-CM | POA: Insufficient documentation

## 2016-11-20 DIAGNOSIS — T82856A Stenosis of peripheral vascular stent, initial encounter: Secondary | ICD-10-CM | POA: Insufficient documentation

## 2016-11-20 DIAGNOSIS — E785 Hyperlipidemia, unspecified: Secondary | ICD-10-CM | POA: Diagnosis not present

## 2016-11-20 DIAGNOSIS — Z7902 Long term (current) use of antithrombotics/antiplatelets: Secondary | ICD-10-CM | POA: Insufficient documentation

## 2016-11-20 DIAGNOSIS — Z87891 Personal history of nicotine dependence: Secondary | ICD-10-CM | POA: Diagnosis not present

## 2016-11-20 DIAGNOSIS — F0280 Dementia in other diseases classified elsewhere without behavioral disturbance: Secondary | ICD-10-CM | POA: Insufficient documentation

## 2016-11-20 DIAGNOSIS — Z7951 Long term (current) use of inhaled steroids: Secondary | ICD-10-CM | POA: Insufficient documentation

## 2016-11-20 DIAGNOSIS — Z7982 Long term (current) use of aspirin: Secondary | ICD-10-CM | POA: Insufficient documentation

## 2016-11-20 DIAGNOSIS — Y831 Surgical operation with implant of artificial internal device as the cause of abnormal reaction of the patient, or of later complication, without mention of misadventure at the time of the procedure: Secondary | ICD-10-CM | POA: Insufficient documentation

## 2016-11-20 DIAGNOSIS — I509 Heart failure, unspecified: Secondary | ICD-10-CM | POA: Diagnosis not present

## 2016-11-20 DIAGNOSIS — F329 Major depressive disorder, single episode, unspecified: Secondary | ICD-10-CM | POA: Insufficient documentation

## 2016-11-20 DIAGNOSIS — I110 Hypertensive heart disease with heart failure: Secondary | ICD-10-CM | POA: Diagnosis not present

## 2016-11-20 DIAGNOSIS — J449 Chronic obstructive pulmonary disease, unspecified: Secondary | ICD-10-CM | POA: Insufficient documentation

## 2016-11-20 HISTORY — PX: ABDOMINAL AORTOGRAM W/LOWER EXTREMITY: CATH118223

## 2016-11-20 LAB — POCT I-STAT, CHEM 8
BUN: 21 mg/dL — AB (ref 6–20)
CALCIUM ION: 1.11 mmol/L — AB (ref 1.15–1.40)
CREATININE: 1 mg/dL (ref 0.61–1.24)
Chloride: 103 mmol/L (ref 101–111)
GLUCOSE: 132 mg/dL — AB (ref 65–99)
HCT: 41 % (ref 39.0–52.0)
HEMOGLOBIN: 13.9 g/dL (ref 13.0–17.0)
Potassium: 4.2 mmol/L (ref 3.5–5.1)
Sodium: 140 mmol/L (ref 135–145)
TCO2: 25 mmol/L (ref 22–32)

## 2016-11-20 LAB — GLUCOSE, CAPILLARY: GLUCOSE-CAPILLARY: 124 mg/dL — AB (ref 65–99)

## 2016-11-20 LAB — POCT ACTIVATED CLOTTING TIME
ACTIVATED CLOTTING TIME: 208 s
ACTIVATED CLOTTING TIME: 169 s
Activated Clotting Time: 268 seconds
Activated Clotting Time: 191 seconds

## 2016-11-20 SURGERY — ABDOMINAL AORTOGRAM W/LOWER EXTREMITY
Anesthesia: LOCAL

## 2016-11-20 MED ORDER — NITROGLYCERIN 1 MG/10 ML FOR IR/CATH LAB
INTRA_ARTERIAL | Status: AC
  Filled 2016-11-20: qty 10

## 2016-11-20 MED ORDER — SODIUM CHLORIDE 0.9 % IV SOLN: INTRAVENOUS | Status: AC

## 2016-11-20 MED ORDER — MIDAZOLAM HCL 2 MG/2ML IJ SOLN
INTRAMUSCULAR | Status: DC | PRN
  Administered 2016-11-20 (×2): 0.5 mg via INTRAVENOUS

## 2016-11-20 MED ORDER — HEPARIN (PORCINE) IN NACL 2-0.9 UNIT/ML-% IJ SOLN
INTRAMUSCULAR | Status: AC | PRN
  Administered 2016-11-20: 1000 mL via INTRA_ARTERIAL

## 2016-11-20 MED ORDER — IODIXANOL 320 MG/ML IV SOLN
INTRAVENOUS | Status: DC | PRN
  Administered 2016-11-20: 95 mL via INTRA_ARTERIAL

## 2016-11-20 MED ORDER — SODIUM CHLORIDE 0.9% FLUSH: 3.0000 mL | Freq: Two times a day (BID) | INTRAVENOUS | Status: DC

## 2016-11-20 MED ORDER — SODIUM CHLORIDE 0.9 % IV SOLN: 250.0000 mL | INTRAVENOUS | Status: DC | PRN

## 2016-11-20 MED ORDER — MIDAZOLAM HCL 2 MG/2ML IJ SOLN
INTRAMUSCULAR | Status: AC
  Filled 2016-11-20: qty 2

## 2016-11-20 MED ORDER — FENTANYL CITRATE (PF) 100 MCG/2ML IJ SOLN
INTRAMUSCULAR | Status: AC
  Filled 2016-11-20: qty 2

## 2016-11-20 MED ORDER — HYDRALAZINE HCL 20 MG/ML IJ SOLN
INTRAMUSCULAR | Status: AC
  Filled 2016-11-20: qty 1

## 2016-11-20 MED ORDER — LIDOCAINE HCL (PF) 1 % IJ SOLN
INTRAMUSCULAR | Status: AC
  Filled 2016-11-20: qty 30

## 2016-11-20 MED ORDER — SODIUM CHLORIDE 0.9 % IV SOLN
INTRAVENOUS | Status: DC
  Administered 2016-11-20: 12:00:00 via INTRAVENOUS

## 2016-11-20 MED ORDER — HYDRALAZINE HCL 20 MG/ML IJ SOLN
INTRAMUSCULAR | Status: DC | PRN
  Administered 2016-11-20: 10 mg via INTRAVENOUS

## 2016-11-20 MED ORDER — NITROGLYCERIN 1 MG/10 ML FOR IR/CATH LAB
INTRA_ARTERIAL | Status: DC | PRN
  Administered 2016-11-20: 200 ug via INTRA_ARTERIAL

## 2016-11-20 MED ORDER — LABETALOL HCL 5 MG/ML IV SOLN: 10.0000 mg | INTRAVENOUS | Status: DC | PRN

## 2016-11-20 MED ORDER — FENTANYL CITRATE (PF) 100 MCG/2ML IJ SOLN
INTRAMUSCULAR | Status: DC | PRN
  Administered 2016-11-20: 25 ug via INTRAVENOUS

## 2016-11-20 MED ORDER — HEPARIN (PORCINE) IN NACL 2-0.9 UNIT/ML-% IJ SOLN
INTRAMUSCULAR | Status: AC
  Filled 2016-11-20: qty 1000

## 2016-11-20 MED ORDER — SODIUM CHLORIDE 0.9% FLUSH: 3.0000 mL | INTRAVENOUS | Status: DC | PRN

## 2016-11-20 MED ORDER — HYDRALAZINE HCL 20 MG/ML IJ SOLN: 5.0000 mg | INTRAMUSCULAR | Status: DC | PRN

## 2016-11-20 MED ORDER — LIDOCAINE HCL (PF) 1 % IJ SOLN
INTRAMUSCULAR | Status: DC | PRN
  Administered 2016-11-20: 10 mL

## 2016-11-20 MED ORDER — HEPARIN SODIUM (PORCINE) 1000 UNIT/ML IJ SOLN
INTRAMUSCULAR | Status: DC | PRN
  Administered 2016-11-20: 2000 [IU] via INTRAVENOUS
  Administered 2016-11-20: 6000 [IU] via INTRAVENOUS

## 2016-11-20 MED ORDER — HEPARIN SODIUM (PORCINE) 1000 UNIT/ML IJ SOLN
INTRAMUSCULAR | Status: AC
  Filled 2016-11-20: qty 1

## 2016-11-20 SURGICAL SUPPLY — 23 items
BALLN LUTONIX DCB 6X80X130 (BALLOONS) ×2
BALLN MUSTANG 8.0X40 135 (BALLOONS) ×2
BALLN STERLING OTW 8X40X135 (BALLOONS) ×2
BALLOON LUTONIX DCB 6X80X130 (BALLOONS) ×1 IMPLANT
BALLOON MUSTANG 8.0X40 135 (BALLOONS) ×1 IMPLANT
BALLOON STERLING OTW 8X40X135 (BALLOONS) ×1 IMPLANT
CATH ANGIO 5F BER2 100CM (CATHETERS) ×2 IMPLANT
CATH ANGIO 5F PIGTAIL 100CM (CATHETERS) ×2 IMPLANT
COVER PRB 48X5XTLSCP FOLD TPE (BAG) ×1 IMPLANT
COVER PROBE 5X48 (BAG) ×1
KIT ENCORE 26 ADVANTAGE (KITS) ×2 IMPLANT
KIT MICROINTRODUCER STIFF 5F (SHEATH) ×2 IMPLANT
KIT PV (KITS) ×2 IMPLANT
SHEATH PINNACLE 5F 10CM (SHEATH) ×2 IMPLANT
SHEATH SHUTTLE 5F/110 (SHEATH) ×2 IMPLANT
SHIELD RADPAD SCOOP 12X17 (MISCELLANEOUS) ×2 IMPLANT
STOPCOCK MORSE 400PSI 3WAY (MISCELLANEOUS) ×2 IMPLANT
SYR MEDRAD MARK V 150ML (SYRINGE) ×2 IMPLANT
TRANSDUCER W/STOPCOCK (MISCELLANEOUS) ×2 IMPLANT
TRAY PV CATH (CUSTOM PROCEDURE TRAY) ×2 IMPLANT
TUBING HIGH PRESSURE 120CM (CONNECTOR) ×2 IMPLANT
WIRE G V18X300CM (WIRE) ×2 IMPLANT
WIRE HI TORQ VERSACORE J 260CM (WIRE) ×2 IMPLANT

## 2016-11-20 NOTE — Interval H&P Note (Signed)
History and Physical Interval Note:  11/20/2016 12:30 PM  [name]  has presented today for surgery, with the diagnosis of paod, occulsion of fem-pop graft bilaterally  The various methods of treatment have been discussed with the patient and family. After consideration of risks, benefits and other options for treatment, the patient has consented to  Procedure(s): ABDOMINAL AORTOGRAM W/LOWER EXTREMITY (N/A) as a surgical intervention .  The patient's history has been reviewed, patient examined, no change in status, stable for surgery.  I have reviewed the patient's chart and labs.  Questions were answered to the patient's satisfaction.     [name]

## 2016-11-20 NOTE — Op Note (Signed)
Patient name: [name] MRN: [id number] DOB: April 10, 1934 Sex: male  11/20/2016 Pre-operative Diagnosis: End stent stenosis Post-operative diagnosis:  Same Surgeon:  [name] Procedure Performed:  1.  Ultrasound-guided access, left brachial artery  2.  Abdominal aortogram  3.  Drug coated balloon and plasty, right external iliac artery  4.  Drug coated balloon and plasty, right common iliac artery  5.  Conscious sedation) 60 minutes)    Indications:  The patient was found to have elevated velocities within his right external iliac artery, the inflow source to his right to left femoral-femoral bypass graft.  He comes in today for further evaluation  Procedure:  The patient was identified in the holding area and taken to room 8.  The patient was then placed supine on the table and prepped and draped in the usual sterile fashion.  A time out was called.  Conscious sedation was administered with the use of IV fentanyl and Versed in a continuous position and nurse monitoring.  Heart rate, blood pressure, and oxygen saturations were continuously monitored.  Ultrasound was used to evaluate the left brachial  artery.  It was patent .  A digital ultrasound image was acquired.  A micropuncture needle was used to access the left brachialy under ultrasound guidance.  An 018 wire was advanced without resistance and a micropuncture sheath was placed.  The 018 wire was removed and a benson wire was placed.  The micropuncture sheath was exchanged for a 5 french sheath. 2000 units of heparin and 400 g of nitroglycerin were administered through the sheath.  Using a pigtail catheter and a woolly wire, the cath was navigated into the descending thoracic aorta and positioned at the level of L1 and an abdominal aortogram was performed.  The catheter was then advanced further to the aortic bifurcation and pelvic angiography was performed   Findings:   Aortogram:  Calcific disease within the abdominal  aorta no significant stenosis was identified however there is luminal narrowing in the midportion.  The renal arteries are patent.  The left iliac system is occluded.  The right common and external iliac arteries are patent as are they're associated stent, however there is approximately a 70% stenosis within the midportion at the hypogastric origin.  The femoral-femoral bypass graft is patent throughout it's course.    Intervention:  A 5 French 110 cm sheath was then inserted over a woolly wire into the right common iliac artery.  The patient was fully heparinized.  I was able to navigate the woolly wire across the lesion.  I selected a 6 x 80 drug coated  Lutonix balloon into the right common and external iliac artery and performed drug coated balloon angioplasty taking the balloon to 14 atm for 2 minutes.  Completion imaging revealed significantly improved appearance of the stenosis, now approximately 20%.  I wanted to address 2 additional areas and therefore I changed out to AV-18 wire and inserted a 8 x 40 Sterling balloon and perform balloon angioplasty of the distal right external iliac artery stent.  I tried to perform repeat balloon angioplasty with the 8 mm balloon in the midportion of the stents, however I could not get the balloon to be situated properly as a wanted to move out of the area which I suspect was rigid because of the stents.  I performed a completion arteriogram and felt like the area was adequately treated.  The residual stenosis was approximately 10%.  Therefore I decided to terminate the  procedure.  Catheters and wires removed.  The long sheath was exchanged out for a short 6 [place] sheath.  The patient tolerated the procedure well.  There were no immediate complications.  He'll be taken the holding area for a sheath pull once his coag profile corrects.   Impression:  #1  70% stenosis within the right iliac system  #2  successful drug coated balloon angioplasty using a 6 mm balloon  to the external iliac and common iliac artery stents with residual stenosis approximately 10%  #3  left common femoral stenosis at the anastomosis which could not be accessed from the brachial approach.  This will be need to be monitored on future ultrasounds.    Theotis Burrow, M.D. [facility] [place] Office: [phone] Pager:  [phone]

## 2016-11-20 NOTE — H&P (View-Only) (Signed)
Vascular and Vein Specialist of [facility]  Patient name: [name] MRN: [id number] DOB: 23-Sep-1934 Sex: male   REASON FOR VISIT:    Follow up  HISOTRY OF PRESENT ILLNESS:    [name] is a 82 y.o. male returns today for follow-up.  He has a history of a right to left femoral-femoral bypass graft as well as bilateral femoral-popliteal bypass grafts, all done by Dr. Kellie Simmering.  In November 2017 he went for arteriogram and is status post atherectomy and drug coated balloon angioplasty of the right iliac artery and subsequent stenting of a nonflow limiting dissection.  Subsequent to this he came back for an ultrasound that showed a high-grade right external iliac velocities and decrease in ABIs.  On 04/24/2016 he underwent angiography and he was found to have significant stenosis in the external iliac artery below the previously deployed stent.  This was primarily stented.  The femoral-femoral bypass graft was widely patent  He was most recently seen by Vinnie Level in July 2018.  Ultrasound revealed silent occlusion of the right femoral-popliteal bypass graft.  He now has a silent occlusion of bilateral femoral-popliteal bypass grafts.  There was also a stenosis detected in his distal right external iliac artery.  According to the wife, his feet feel a little cooler than normal.  She is also concerned about 8 discoloration.  When she does taken out for a day trip, he will get pain in his legs.  He does not have any open wounds.   PAST MEDICAL HISTORY:   Past Medical History:  Diagnosis Date  . Alzheimer's dementia 2013  . Anemia    HX OF ANEMIA  . CHF (congestive heart failure) ([place])   . Colon cancer ([place])   . COPD (chronic obstructive pulmonary disease) ([place])   . Depression   . Dyspnea   . Hyperlipidemia   . Hypertension   . Pneumonia 2014  . PVD (peripheral vascular disease) ([place])   . Type II diabetes mellitus ([place])      FAMILY HISTORY:    Family History  Problem Relation Age of Onset  . Stroke Mother 18  . Hypertension Mother   . Cancer Sister   . Heart disease Sister   . Heart disease Brother        Heart Disease before age 33  . Heart attack Brother   . Cancer Sister   . Cancer Brother     SOCIAL HISTORY:   Social History  Substance Use Topics  . Smoking status: Former Smoker    Packs/day: 0.30    Years: 40.00    Types: Cigarettes    Quit date: 03/26/1993  . Smokeless tobacco: Never Used  . Alcohol use No     ALLERGIES:   No Known Allergies   CURRENT MEDICATIONS:   Current Outpatient Prescriptions  Medication Sig Dispense Refill  . albuterol (PROVENTIL HFA;VENTOLIN HFA) 108 (90 BASE) MCG/ACT inhaler Inhale 2 puffs into the lungs every 6 (six) hours as needed for shortness of breath. 1 Inhaler 5  . albuterol (PROVENTIL) (2.5 MG/3ML) 0.083% nebulizer solution Inhale 3 mLs into the lungs every 6 (six) hours as needed for wheezing or shortness of breath.     Marland Kitchen aspirin 81 MG tablet Take 81 mg by mouth every morning.     . budesonide-formoterol (SYMBICORT) 160-4.5 MCG/ACT inhaler Inhale 2 puffs into the lungs 2 (two) times daily. 2 Inhaler 0  . Cholecalciferol (VITAMIN D) 2000 UNITS CAPS Take 2,000 Units by mouth at  bedtime.     . clopidogrel (PLAVIX) 75 MG tablet Take 1 tablet (75 mg total) by mouth daily. 30 tablet 11  . digoxin (LANOXIN) 0.125 MG tablet Take 0.125 mg by mouth every morning.     . furosemide (LASIX) 20 MG tablet Take 1 tablet (20 mg total) by mouth daily. (Patient taking differently: Take 20 mg by mouth every morning. ) 5 tablet 0  . metoprolol tartrate (LOPRESSOR) 25 MG tablet Take 0.5 tablets (12.5 mg total) by mouth 2 (two) times daily. 30 tablet 0  . Multiple Vitamins-Minerals (CENTRUM SILVER PO) Take 1 tablet by mouth daily.     . Omega-3 Fatty Acids (FISH OIL) 1000 MG CAPS Take by mouth.    . pravastatin (PRAVACHOL) 40 MG tablet Take 40 mg by mouth every evening.     . TRADJENTA 5  MG TABS tablet Take 5 mg by mouth daily.      No current facility-administered medications for this visit.     REVIEW OF SYSTEMS:   [X]  denotes positive finding, [ ]  denotes negative finding Cardiac  Comments:  Chest pain or chest pressure:    Shortness of breath upon exertion:    Short of breath when lying flat:    Irregular heart rhythm:        Vascular    Pain in calf, thigh, or hip brought on by ambulation: x   Pain in feet at night that wakes you up from your sleep:     Blood clot in your veins:    Leg swelling:         Pulmonary    Oxygen at home:    Productive cough:     Wheezing:         Neurologic    Sudden weakness in arms or legs:     Sudden numbness in arms or legs:     Sudden onset of difficulty speaking or slurred speech:    Temporary loss of vision in one eye:     Problems with dizziness:         Gastrointestinal    Blood in stool:     Vomited blood:         Genitourinary    Burning when urinating:     Blood in urine:        Psychiatric    Major depression:         Hematologic    Bleeding problems:    Problems with blood clotting too easily:        Skin    Rashes or ulcers:        Constitutional    Fever or chills:      PHYSICAL EXAM:   Vitals:   11/05/16 1615  BP: (!) 183/77  Pulse: (!) 52  Resp: 16  Temp: 97.6 F (36.4 C)  TempSrc: Oral  SpO2: 96%  Weight: 165 lb (74.8 kg)  Height: 5\' 9"  (1.753 m)    GENERAL: The patient is a well-nourished male, in no acute distress. The vital signs are documented above. CARDIAC: There is a regular rate and rhythm.  VASCULAR: Nonpalpable pedal pulses. PULMONARY: Non-labored respirations ABDOMEN: Soft and non-tender with normal pitched bowel sounds.  MUSCULOSKELETAL: There are no major deformities or cyanosis. NEUROLOGIC: No focal weakness or paresthesias are detected. SKIN: There are no ulcers or rashes noted. PSYCHIATRIC: The patient has a normal affect.  STUDIES:   I have reviewed  the ultrasound study which shows a patent right to left  femoral-femoral bypass graft with increased velocities within the right inflow artery.  ABI on the right is 0.45.  On the left 0.44.  These are essentially unchanged.  MEDICAL ISSUES:   The patient has had silent occlusion of bilateral femoral popliteal arteries.  He has previously undergone stenting of the right iliac artery which is the inflow to his right to left femoral-femoral bypass graft.  Most recent ultrasound suggested worsening stenosis within the right distal external iliac artery.  I discussed with the patient that I feel that this puts his femoral-femoral graft at risk for failure.  After multiple lengthy discussions, we have decided to proceed with angiography.  This will be through a left brachial approach.  This will be scheduled for Tuesday in August.    [name], MD Vascular and Vein Specialists of [facility] [phone] Pager [phone]

## 2016-11-20 NOTE — Progress Notes (Signed)
50fr sheath removed from left brachial artery. Manual pressure applied for 30 minutes. Site swollen, was swollen before sheath removal. No hematoma present. Spo2 98% as measured in left thumb. Tegaderm dressing applied.  Reinforced padded armboards placed under left elbow and kept in place with co-ban.   Bed rest begins at17:30:00

## 2016-11-20 NOTE — Discharge Instructions (Signed)
BRACHIAL SITE CARE Refer to this sheet in the next few weeks. These instructions provide you with information about caring for yourself after your procedure. Your health care provider may also give you more specific instructions. Your treatment has been planned according to current medical practices, but problems sometimes occur. Call your health care provider if you have any problems or questions after your procedure. What can I expect after the procedure? After your procedure, it is typical to have the following:  Bruising at the brachial  site that usually fades within 1-2 weeks.  Blood collecting in the tissue (hematoma) that may be painful to the touch. It should usually decrease in size and tenderness within 1-2 weeks.  Follow these instructions at home:  Take medicines only as directed by your health care provider.  You may shower 24-48 hours after the procedure or as directed by your health care provider. Remove the bandage (dressing) and gently wash the site with plain soap and water. Pat the area dry with a clean towel. Do not rub the site, because this may cause bleeding.  Do not take baths, swim, or use a hot tub until your health care provider approves.  Check your insertion site every day for redness, swelling, or drainage.  Do not apply powder or lotion to the site.  Do not flex or bend the affected arm for 24 hours or as directed by your health care provider.  Do not push or pull heavy objects with the affected arm for 24 hours or as directed by your health care provider.  Do not lift over 10 lb (4.5 kg) for 5 days after your procedure or as directed by your health care provider.  Ask your health care provider when it is okay to: ? Return to work or school. ? Resume usual physical activities or sports. ? Resume sexual activity.  Do not drive home if you are discharged the same day as the procedure. Have someone else drive you.  You may drive 24 hours after the  procedure unless otherwise instructed by your health care provider.  Do not operate machinery or power tools for 24 hours after the procedure.  If your procedure was done as an outpatient procedure, which means that you went home the same day as your procedure, a responsible adult should be with you for the first 24 hours after you arrive home.  Keep all follow-up visits as directed by your health care provider. This is important. Contact a health care provider if:  You have a fever.  You have chills.  You have increased bleeding from the brachial  site. Hold pressure on the site. Get help right away if:  You have unusual pain at the brachial site.  You have redness, warmth, or swelling at the radial site.  You have drainage (other than a small amount of blood on the dressing) from the brachial site.  The brachial site is bleeding, and the bleeding does not stop after 30 minutes of holding steady pressure on the site.  Your arm or hand becomes pale, cool, tingly, or numb. This information is not intended to replace advice given to you by your health care provider. Make sure you discuss any questions you have with your health care provider. Document Released: 04/14/2010 Document Revised: 08/18/2015 Document Reviewed: 09/28/2013 Elsevier Interactive Patient Education  2018 [employer].

## 2016-11-20 NOTE — Progress Notes (Signed)
Swelling noted left ac and Aaron Edelman from cath lab in and checked left brachial site and states that it is not any more swollen than it was and states that swelling was present prior to sheath pull

## 2016-11-20 NOTE — Progress Notes (Signed)
Wife in to visit.

## 2016-11-21 ENCOUNTER — Telehealth: Payer: Self-pay

## 2016-11-21 ENCOUNTER — Encounter (HOSPITAL_COMMUNITY): Payer: Self-pay | Admitting: Surgery

## 2016-11-21 NOTE — Telephone Encounter (Signed)
Pt's daughter called to report that Mr. [name]'s L arm incision was bleeding and swollen. Returned call s/w wife and she stated that their daughter had got the bleeding to stop completely but she was still concerned about his L arm being "puffy" and "discolored". Pt's temp was 98.6. Instructed wife to apply a clean dry gauze to the area and have him keep his arm elevated above the level of his heart. S/w Zigmund Daniel RN and she suggested this would be ok. Also told Ms. Stupka that if he began to run a fever or c/o numbness/tingling in his L hand to give our office a call. Ms. Zaremba voiced understanding and agreed with plan.

## 2016-11-22 ENCOUNTER — Telehealth: Payer: Self-pay | Admitting: Family

## 2016-11-22 NOTE — Telephone Encounter (Signed)
-----   Message from Mena Goes, RN sent at 11/20/2016  2:18 PM EDT ----- Regarding: 3 months w/ aortoiliac and see NP   ----- Message ----- From: Serafina Mitchell, MD Sent: 11/20/2016   2:10 PM To: Vvs Charge Pool  11/20/2016: Surgeon:  [name] Procedure Performed:  1.  Ultrasound-guided access, left brachial artery  2.  Abdominal aortogram  3.  Drug coated balloon and plasty, right external iliac artery  4.  Drug coated balloon and plasty, right common iliac artery  5.  Conscious sedation) 60 minutes)   Follow-up 3 months with aortoiliac duplex to see [name]

## 2016-11-22 NOTE — Telephone Encounter (Signed)
Scheappt 02/20/17; lab at 8:00 and NP at 9:15. Mailed appt letter.

## 2016-12-21 ENCOUNTER — Telehealth: Payer: Self-pay | Admitting: Adult Health

## 2016-12-21 NOTE — Telephone Encounter (Signed)
Spoke with wife and advised samples are up front to pick. She verbalized understanding.

## 2017-01-24 ENCOUNTER — Other Ambulatory Visit: Payer: Self-pay

## 2017-01-24 DIAGNOSIS — I771 Stricture of artery: Secondary | ICD-10-CM

## 2017-01-30 DIAGNOSIS — R05 Cough: Secondary | ICD-10-CM | POA: Diagnosis not present

## 2017-01-30 DIAGNOSIS — R296 Repeated falls: Secondary | ICD-10-CM | POA: Diagnosis not present

## 2017-02-04 ENCOUNTER — Encounter: Payer: Self-pay | Admitting: Internal Medicine

## 2017-02-04 ENCOUNTER — Ambulatory Visit (INDEPENDENT_AMBULATORY_CARE_PROVIDER_SITE_OTHER)
Admission: RE | Admit: 2017-02-04 | Discharge: 2017-02-04 | Disposition: A | Discharge status: Home / Self Care | Payer: PPO | Source: Ambulatory Visit | Attending: Internal Medicine | Admitting: Internal Medicine

## 2017-02-04 ENCOUNTER — Ambulatory Visit: Payer: PPO | Admitting: Internal Medicine

## 2017-02-04 VITALS — BP 120/72 | HR 77 | Temp 97.5°F | Ht 72.0 in | Wt 163.0 lb

## 2017-02-04 DIAGNOSIS — R058 Other specified cough: Secondary | ICD-10-CM

## 2017-02-04 DIAGNOSIS — R05 Cough: Secondary | ICD-10-CM

## 2017-02-04 DIAGNOSIS — J441 Chronic obstructive pulmonary disease with (acute) exacerbation: Secondary | ICD-10-CM

## 2017-02-04 DIAGNOSIS — J449 Chronic obstructive pulmonary disease, unspecified: Secondary | ICD-10-CM | POA: Diagnosis not present

## 2017-02-04 MED ORDER — PANTOPRAZOLE SODIUM 40 MG PO TBEC: 40.0000 mg | DELAYED_RELEASE_TABLET | Freq: Every day | ORAL | 2 refills | Status: DC

## 2017-02-04 MED ORDER — FAMOTIDINE 20 MG PO TABS: ORAL_TABLET | ORAL | 2 refills | Status: DC

## 2017-02-04 MED ORDER — PREDNISONE 10 MG PO TABS: ORAL_TABLET | ORAL | 0 refills | Status: DC

## 2017-02-04 MED ORDER — BUDESONIDE-FORMOTEROL FUMARATE 160-4.5 MCG/ACT IN AERO: 2.0000 | INHALATION_SPRAY | Freq: Two times a day (BID) | RESPIRATORY_TRACT | 0 refills | Status: DC

## 2017-02-04 NOTE — Patient Instructions (Addendum)
For cough > mucinex dm 1200 mg every 12 hours as needed    Prednisone Take 4 for two days three for two days two for two days one for two days   Pantoprazole (protonix) 40 mg   Take  30-60 min before first meal of the day and Pepcid (famotidine)  20 mg one @  bedtime until return to office - this is the best way to tell whether stomach acid is contributing to your problem.   GERD (REFLUX)  is an extremely common cause of respiratory symptoms just like yours , many times with no obvious heartburn at all.    It can be treated with medication, but also with lifestyle changes including elevation of the head of your bed (ideally with 6 inch  bed blocks),  Smoking cessation, avoidance of late meals, excessive alcohol, and avoid fatty foods, chocolate, peppermint, colas, red wine, and acidic juices such as orange juice.  NO MINT OR MENTHOL PRODUCTS SO NO COUGH DROPS   USE SUGARLESS CANDY INSTEAD (Jolley ranchers or Stover's or Life Savers) or even ice chips will also do - the key is to swallow to prevent all throat clearing. NO OIL BASED VITAMINS - use powdered substitutes.    Please remember to go to the  x-ray department downstairs in the basement  for your tests - we will call you with the results when they are available.      Please schedule a follow up office visit in 2  weeks, sooner if needed to see Tammy NP for recheck   add :  Consider change to laba/ics neb  Or DPI if he can master technique and it doesn't aggravate his uacs

## 2017-02-04 NOTE — Progress Notes (Signed)
Subjective:     Patient ID: [name], male   DOB: 06-Jan-1935  MRN: [id number]     Brief patient profile:  82  yobm s/p smoking cessation in 1995 previously felt to have COPD  GOLD II criteria with reversibility 01/06/09    History of Present Illness  January 06, 2009 Followup with PFT's. Pt states that overall breathing has been okay. He gets SOB when he gets in a rush. [name] states that pt gets out of breath just walking from one room to the next. Also he c/o dry cough- especially at night x 4-6 weeks eval by [facility] with cxr and rx cough syrup > no better. no purulent sputum. Try higher strength of Symbicort 160 2 puffs first thing in am and 2 puffs again in pm about 12 hours later and if happy with it fill the prescription.  as long as you are coughing take the zegerid otc at bedtime > seemed to help to pt and wife's satisfaction.       01/25/2016 NP  Follow up : COPD GOLD II  Patient presents for a one-month follow-up, along with his wife. Last visit was having a mild flare COPD, was given a prednisone taper with improvement in his symptoms. Patient cough has decreased. Wife says breathing has returned back to his baseline. Patient has moderate to severe dementia. We reviewed all his medications organize them into a medication count with patient education Appears be taking correctly. rec Continue on Symbicort Twice daily  , rinse after use.  Follow med calendar closely and bring to each visit   03/29/2016 acute extended ov/Kajal Scalici re: sob/noisy breathing / GOLD II copd / no med calendar Chief Complaint  Patient presents with  . Acute Visit    Pt c/o SOB with or without any exertion for the past 5 days. He also c/o cough and wheezing. Cough is prod with white sputum.    very noisy breathing intermittently day and night lasts from one or two breaths to one or two min not present sleeping and still able to lie flat assoc with worse coughing fits/ no apparent sinus flare or  dysphagia/ choking on foods rec For cough / congestion > mucinex dm up to 1200 mg every 12 hours as needed  Zpak Prednisone 10 mg take  4 each am x 2 days,   2 each am x 2 days,  1 each am x 2 days and stop  Pantoprazole (protonix) 40 mg   Take  30-60 min before first meal of the day and Pepcid (famotidine)  20 mg one @  bedtime until return to office - this is the best way to tell whether stomach acid is contributing to your problem.   GERD diet       02/04/2017   Acute  ov/Lamanda Rudder re:  COPD II/ acute flare of cough  Chief Complaint  Patient presents with  . Acute Visit    Pt c/o chest congestion for at least the past wk. He is coughing throughout the night and producing some clear sputum.     abruptly worse cough/ wheeze worse at hs  X one week/ overall geriatric/ congitive decline noted  No trouble with swallow except for  big pills Started needing neb 10 days prior to OV and hfa quite poor at baseline    No obvious patterns in day to day or daytime variability or assoc   purulent sputum or mucus plugs or hemoptysis or cp or chest tightness,  subjective wheeze or overt sinus or hb symptoms. No unusual exp hx or h/o childhood pna/ asthma or knowledge of premature birth.    Also denies any obvious fluctuation of symptoms with weather or environmental changes or other aggravating or alleviating factors except as outlined above   Current Allergies, Complete Past Medical History, Past Surgical History, Family History, and Social History were reviewed in [employer] record.  ROS  The following are not active complaints unless bolded Hoarseness, sore throat, dysphagia, dental problems, itching, sneezing,  nasal congestion or discharge of excess mucus or purulent secretions, ear ache,   fever, chills, sweats, unintended wt loss or wt gain, classically pleuritic or exertional cp,  orthopnea pnd or leg swelling, presyncope, palpitations, abdominal pain, anorexia, nausea,  vomiting, diarrhea  or change in bowel habits or change in bladder habits, change in stools or change in urine, dysuria, hematuria,  rash, arthralgias, visual complaints, headache, numbness, weakness or ataxia or problems with walking or coordination,  change in mood/affect or memory.        Current Meds  Medication Sig  . albuterol (PROVENTIL HFA;VENTOLIN HFA) 108 (90 BASE) MCG/ACT inhaler Inhale 2 puffs into the lungs every 6 (six) hours as needed for shortness of breath.  Marland Kitchen albuterol (PROVENTIL) (2.5 MG/3ML) 0.083% nebulizer solution Inhale 3 mLs into the lungs every 6 (six) hours as needed for wheezing or shortness of breath.   . budesonide-formoterol (SYMBICORT) 160-4.5 MCG/ACT inhaler Inhale 2 puffs into the lungs 2 (two) times daily.  . Cholecalciferol (VITAMIN D) 2000 UNITS CAPS Take 2,000 Units by mouth daily.   . clopidogrel (PLAVIX) 75 MG tablet Take 1 tablet (75 mg total) by mouth daily.  . digoxin (LANOXIN) 0.125 MG tablet Take 0.125 mg by mouth.   . furosemide (LASIX) 20 MG tablet Take 1 tablet (20 mg total) by mouth daily.  . metoprolol tartrate (LOPRESSOR) 25 MG tablet Take 0.5 tablets (12.5 mg total) by mouth 2 (two) times daily.  . pravastatin (PRAVACHOL) 40 MG tablet Take 40 mg by mouth daily after supper.   . sitaGLIPtin (JANUVIA) 100 MG tablet Take 100 mg by mouth daily.  . [DISCONTINUED] Omega-3 Fatty Acids (FISH OIL) 1000 MG CAPS Take 1,000 mg by mouth at bedtime.                  Past Medical History:  HYPERLIPIDEMIA (ICD-272.4)  PVD (ICD-443.9)  HYPERTENSION NEC  DYSPNEA (ICD-786.05)  COPD  PFT's 11/18/00 FEV1 is 1.94 or 60%  - PFT's 01/06/09 FEV1 1.75 (57%) ratio 49 and 21 % better FVC after B2 DLC0 71  - HFA 90% better p coaching 01/23/10  -med calendar 12/22/2014 . 01/25/2016             Objective:   Physical Exam   elderly black male ambulatory  / congested cough but no increased wob    Vitals signs reviewed  - sats 99% on arrival RA  Wt   187 January 06, 2009 > 186 01/23/10 > 05/04/2011  191 > 06/16/2013  170 >169 09/01/2013 >   169 09/23/2013 > 06/04/2014  181 >171 12/22/2014 > 12/28/2015  168 > 02/04/2017   163      HEENT: nl  turbinates bilaterally, and oropharynx. Nl external ear canals without cough reflex - edentulous    HEENT: nl   oropharynx. Nl external ear canals without cough reflex - moderate bilateral non-specific turbinate edema  / edentulous    NECK :  without JVD/Nodes/TM/ nl  carotid upstrokes bilaterally   LUNGS: no acc muscle use,  Nl contour chest which is clear to A and P bilaterally without cough on insp or exp maneuvers   CV:  RRR  no s3 or murmur or increase in P2, and no edema   ABD:  soft and nontender with nl inspiratory excursion in the supine position. No bruits or organomegaly appreciated, bowel sounds nl  MS: very slow  gait/ ext warm without deformities, calf tenderness, cyanosis or clubbing No obvious joint restrictions   SKIN: warm and dry without lesions    NEURO:  alert,   no motor or cerebellar deficits apparent/ very little if any spontaneous speech, just nods when wife does the talking      CXR PA and Lateral:   02/04/2017 :    I personally reviewed images and agree with radiology impression as follows:    No active cardiopulmonary disease.

## 2017-02-05 NOTE — Progress Notes (Signed)
LMTCB

## 2017-02-06 ENCOUNTER — Encounter: Payer: Self-pay | Admitting: Internal Medicine

## 2017-02-06 DIAGNOSIS — N182 Chronic kidney disease, stage 2 (mild): Secondary | ICD-10-CM | POA: Diagnosis not present

## 2017-02-06 DIAGNOSIS — E1122 Type 2 diabetes mellitus with diabetic chronic kidney disease: Secondary | ICD-10-CM | POA: Diagnosis not present

## 2017-02-06 DIAGNOSIS — N08 Glomerular disorders in diseases classified elsewhere: Secondary | ICD-10-CM | POA: Diagnosis not present

## 2017-02-06 DIAGNOSIS — I1310 Hypertensive heart and chronic kidney disease without heart failure, with stage 1 through stage 4 chronic kidney disease, or unspecified chronic kidney disease: Secondary | ICD-10-CM | POA: Diagnosis not present

## 2017-02-06 NOTE — Assessment & Plan Note (Signed)
Added back gerd rx /diet 02/04/2017  > this includes avoiding oil based vitamins  see avs for instructions unique to this ov

## 2017-02-06 NOTE — Assessment & Plan Note (Addendum)
PFT's 11/18/00 FEV1 is 1.94 or 60%  - PFT's 01/06/09 FEV1 1.75 (57%) ratio 49 and 21 % better FVC after B2 DLC0 71  - Prevnar given 09/23/13  - 09/23/2013 p extensive coaching HFA effectiveness =    75%  - 06/04/2014  Trial of tudorza> did not maintain on it    - 02/04/2017  After extensive coaching HFA effectiveness =   50% from a baseline 0   Despite poor baseline technique and progressive cognitive decline this is the first flare this year in pt with underlying GOLD II copd  but concerned it may not be his last and will either need to change over to dpi ie Trelegy or laba/ics neb when returns for recheck if hfa not some better   I had an extended discussion with the patien's wife  reviewing all relevant studies completed to date and  lasting 15 to 20 minutes of a 25 minute acute visit    Each maintenance medication was reviewed in detail including most importantly the difference between maintenance and prns and under what circumstances the prns are to be triggered using an action plan format that is not reflected in the computer generated alphabetically organized AVS.    Please see AVS for specific instructions unique to this visit that I personally wrote and verbalized to the the pt in detail and then reviewed with pt  by my nurse highlighting any  changes in therapy recommended at today's visit to their plan of care.

## 2017-02-18 ENCOUNTER — Encounter: Payer: Self-pay | Admitting: Internal Medicine

## 2017-02-18 ENCOUNTER — Ambulatory Visit (INDEPENDENT_AMBULATORY_CARE_PROVIDER_SITE_OTHER): Payer: PPO | Admitting: Internal Medicine

## 2017-02-18 VITALS — BP 112/62 | HR 70 | Ht 69.0 in | Wt 165.6 lb

## 2017-02-18 DIAGNOSIS — J449 Chronic obstructive pulmonary disease, unspecified: Secondary | ICD-10-CM

## 2017-02-18 MED ORDER — FLUTICASONE-UMECLIDIN-VILANT 100-62.5-25 MCG/INH IN AEPB: 1.0000 | INHALATION_SPRAY | Freq: Every day | RESPIRATORY_TRACT | 0 refills | Status: DC

## 2017-02-18 NOTE — Assessment & Plan Note (Signed)
PFT's 11/18/00 FEV1 is 1.94 or 60%  - PFT's 01/06/09 FEV1 1.75 (57%) ratio 49 and 21 % better FVC after B2 DLC0 71   02/18/2017  After extensive coaching device effectiveness =    90% with Elipta > try Trelegy samples and return with drug formulary    Formulary restrictions will be an ongoing challenge for the forseable future and I would be happy to pick an alternative if the pt/wife  will first  provide me a list of them but pt  will need to return here for training for any new device that is required eg dpi vs hfa vs respimat.    In meantime we can always provide samples so the patient never runs out of any needed respiratory medications.   F/u will be in 4 weeks to see whether the trelegy is helping and whether covered by the [place]'s formulary  Each maintenance medication was reviewed in detail including most importantly the difference between maintenance and as needed and under what circumstances the prns are to be used.  Please see AVS for specific  Instructions which are unique to this visit and I personally typed out  which were reviewed in detail in writing with the patient and a copy provided.

## 2017-02-18 NOTE — Progress Notes (Signed)
Subjective:     Patient ID: [name], male   DOB: Apr 12, 1934  MRN: [id number]     Brief patient profile:  82  yobm s/p smoking cessation in 1995 previously felt to have COPD  GOLD II criteria with reversibility 01/06/09.   History of Present Illness  January 06, 2009 Followup with PFT's. Pt states that overall breathing has been okay. He gets SOB when he gets in a rush. [name] states that pt gets out of breath just walking from one room to the next. Also he c/o dry cough- especially at night x 4-6 weeks eval by [facility] with cxr and rx cough syrup > no better. no purulent sputum. Try higher strength of Symbicort 160 2 puffs first thing in am and 2 puffs again in pm about 12 hours later and if happy with it fill the prescription.  as long as you are coughing take the zegerid otc at bedtime > seemed to help to pt and wife's satisfaction.       01/25/2016 NP  Follow up : COPD GOLD II  Patient presents for a one-month follow-up, along with his wife. Last visit was having a mild flare COPD, was given a prednisone taper with improvement in his symptoms. Patient cough has decreased. Wife says breathing has returned back to his baseline. Patient has moderate to severe dementia. We reviewed all his medications organize them into a medication count with patient education Appears be taking correctly. rec Continue on Symbicort Twice daily  , rinse after use.  Follow med calendar closely and bring to each visit   03/29/2016 acute extended ov/Desera Graffeo re: sob/noisy breathing / GOLD II copd / no med calendar Chief Complaint  Patient presents with  . Acute Visit    Pt c/o SOB with or without any exertion for the past 5 days. He also c/o cough and wheezing. Cough is prod with white sputum.    very noisy breathing intermittently day and night lasts from one or two breaths to one or two min not present sleeping and still able to lie flat assoc with worse coughing fits/ no apparent sinus flare or  dysphagia/ choking on foods rec For cough / congestion > mucinex dm up to 1200 mg every 12 hours as needed  Zpak Prednisone 10 mg take  4 each am x 2 days,   2 each am x 2 days,  1 each am x 2 days and stop  Pantoprazole (protonix) 40 mg   Take  30-60 min before first meal of the day and Pepcid (famotidine)  20 mg one @  bedtime until return to office - this is the best way to tell whether stomach acid is contributing to your problem.   GERD diet     02/04/2017   Acute  ov/Carmeron Heady re:  COPD II/ acute flare of cough  Chief Complaint  Patient presents with  . Acute Visit    Pt c/o chest congestion for at least the past wk. He is coughing throughout the night and producing some clear sputum.     abruptly worse cough/ wheeze worse at hs  X one week/ overall geriatric/ congitive decline noted  No trouble with swallow except for  big pills Started needing neb 10 days prior to OV and hfa quite poor at baseline  rec For cough > mucinex dm 1200 mg every 12 hours as needed  Prednisone Take 4 for two days three for two days two for two days one for two  days  Pantoprazole (protonix) 40 mg   Take  30-60 min before first meal of the day and Pepcid (famotidine)  20 mg one @  bedtime until return to office - this is the best way to tell whether stomach acid is contributing to your problem.  GERD diet   Please remember to go to the  x-ray department downstairs in the basement  for your tests - we will call you with the results when they are available.  Please schedule a follow up office visit in 2  weeks, sooner if needed to see Tammy NP for recheck   add :  Consider change to laba/ics neb  Or DPI if he can master technique and it doesn't aggravate his uacs     02/18/2017  f/u ov/Eriel Doyon re:  COPD II/ dementia so can't use hfa  Chief Complaint  Patient presents with  . Follow-up    some cold symtoms, some stuffy nose   wife reports better, not needing nebs as much, swallowing ok  Not limited by  breathing from desired activities  But extremely sedentary  No obvious day to day or daytime variability or assoc excess/ purulent sputum or mucus plugs or hemoptysis or cp or chest tightness, subjective wheeze or overt sinus or hb symptoms. No unusual exp hx or h/o childhood pna/ asthma or knowledge of premature birth.  Sleeping ok flat without nocturnal  or early am exacerbation  of respiratory  c/o's or need for noct saba. Also denies any obvious fluctuation of symptoms with weather or environmental changes or other aggravating or alleviating factors except as outlined above   Current Allergies, Complete Past Medical History, Past Surgical History, Family History, and Social History were reviewed in [employer] record.  ROS  The following are not active complaints unless bolded Hoarseness, sore throat, dysphagia, dental problems, itching, sneezing,  nasal congestion or discharge of excess mucus or purulent secretions, ear ache,   fever, chills, sweats, unintended wt loss or wt gain, classically pleuritic or exertional cp,  orthopnea pnd or leg swelling, presyncope, palpitations, abdominal pain, anorexia, nausea, vomiting, diarrhea  or change in bowel habits or change in bladder habits, change in stools or change in urine, dysuria, hematuria,  rash, arthralgias, visual complaints, headache, numbness, weakness or ataxia or problems with walking or coordination,  change in mood/affect or memory.        Current Meds  Medication Sig  . albuterol (PROVENTIL HFA;VENTOLIN HFA) 108 (90 BASE) MCG/ACT inhaler Inhale 2 puffs into the lungs every 6 (six) hours as needed for shortness of breath.  Marland Kitchen albuterol (PROVENTIL) (2.5 MG/3ML) 0.083% nebulizer solution Inhale 3 mLs into the lungs every 6 (six) hours as needed for wheezing or shortness of breath.   . Cholecalciferol (VITAMIN D) 2000 UNITS CAPS Take 2,000 Units by mouth daily.   . clopidogrel (PLAVIX) 75 MG tablet Take 1 tablet (75  mg total) by mouth daily.  . digoxin (LANOXIN) 0.125 MG tablet Take 0.125 mg by mouth.   . famotidine (PEPCID) 20 MG tablet One at bedtime  . furosemide (LASIX) 20 MG tablet Take 1 tablet (20 mg total) by mouth daily.  . metoprolol tartrate (LOPRESSOR) 25 MG tablet Take 0.5 tablets (12.5 mg total) by mouth 2 (two) times daily.  . pantoprazole (PROTONIX) 40 MG tablet Take 1 tablet (40 mg total) daily by mouth. Take 30-60 min before first meal of the day  . pravastatin (PRAVACHOL) 40 MG tablet Take 40 mg by mouth  daily after supper.   . sitaGLIPtin (JANUVIA) 100 MG tablet Take 100 mg by mouth daily.  .   budesonide-formoterol (SYMBICORT) 160-4.5 MCG/ACT inhaler Inhale 2 puffs into the lungs 2 (two) times daily.  . [DISCONTINUED] predniSONE (DELTASONE) 10 MG tablet Take 4 for two days three for two days two for two days one for two days        Past Medical History:  HYPERLIPIDEMIA (ICD-272.4)  PVD (ICD-443.9)  HYPERTENSION NEC  DYSPNEA (ICD-786.05)  COPD  PFT's 11/18/00 FEV1 is 1.94 or 60%  - PFT's 01/06/09 FEV1 1.75 (57%) ratio 49 and 21 % better FVC after B2 DLC0 71  - HFA 90% better p coaching 01/23/10  -med calendar 12/22/2014 . 01/25/2016             Objective:   Physical Exam   elderly black male brought in w/c but can climb on exam table with one person assist/ still somwehat congested/ rattle but no increased wob   Vitals signs reviewed  -  - Note on arrival 02 sats  97 % on RA     Wt  187 January 06, 2009 > 186 01/23/10 > 05/04/2011  191 > 06/16/2013  170 >169 09/01/2013 >   169 09/23/2013 > 06/04/2014  181 >171 12/22/2014 > 12/28/2015  168 > 02/04/2017   163 > 02/18/2017   165       HEENT: nl   turbinates bilaterally, and oropharynx. Nl external ear canals without cough reflex - edentulous   NECK :  without JVD/Nodes/TM/ nl carotid upstrokes bilaterally   LUNGS: no acc muscle use, slt barrel contour chest, minimal insp and exp rhonchi bilaterally    CV:  RRR  no s3 or  murmur or increase in P2, and no edema   ABD:  soft and nontender with nl inspiratory excursion in the supine position. No bruits or organomegaly appreciated, bowel sounds nl  MS:    ext warm without deformities, calf tenderness, cyanosis or clubbing No obvious joint restrictions   SKIN: warm and dry without lesions    NEURO:  alert,   with  no motor or cerebellar deficits apparent but speech somewhat disconnected/ rambling inarticulately

## 2017-02-18 NOTE — Patient Instructions (Addendum)
Plan A = Automatic =  Trelegy one click each am / 2 good drags off of one click - instead of Symbicort     Plan B =   Backup  - only use your albuterol nebulizer up to one vile every 4 hours as needed     Please schedule a follow up office visit in 4 weeks to see Tammy , sooner if needed  with all medications /inhalers/ solutions in hand so we can verify exactly what you are taking. This includes all medications from all doctors and over the counters - [place]

## 2017-02-20 ENCOUNTER — Ambulatory Visit (INDEPENDENT_AMBULATORY_CARE_PROVIDER_SITE_OTHER): Payer: PPO | Admitting: Family

## 2017-02-20 ENCOUNTER — Encounter: Payer: Self-pay | Admitting: Family

## 2017-02-20 ENCOUNTER — Ambulatory Visit (HOSPITAL_COMMUNITY)
Admission: RE | Admit: 2017-02-20 | Discharge: 2017-02-20 | Disposition: A | Discharge status: Home / Self Care | Payer: PPO | Source: Ambulatory Visit | Attending: Family | Admitting: Family

## 2017-02-20 VITALS — BP 150/78 | HR 64 | Temp 97.1°F | Resp 17 | Wt 163.7 lb

## 2017-02-20 DIAGNOSIS — Z951 Presence of aortocoronary bypass graft: Secondary | ICD-10-CM | POA: Insufficient documentation

## 2017-02-20 DIAGNOSIS — I771 Stricture of artery: Secondary | ICD-10-CM

## 2017-02-20 DIAGNOSIS — Z95828 Presence of other vascular implants and grafts: Secondary | ICD-10-CM

## 2017-02-20 DIAGNOSIS — I779 Disorder of arteries and arterioles, unspecified: Secondary | ICD-10-CM | POA: Diagnosis not present

## 2017-02-20 DIAGNOSIS — Z87891 Personal history of nicotine dependence: Secondary | ICD-10-CM | POA: Diagnosis not present

## 2017-02-20 DIAGNOSIS — Z86718 Personal history of other venous thrombosis and embolism: Secondary | ICD-10-CM | POA: Diagnosis not present

## 2017-02-20 NOTE — Progress Notes (Signed)
[facility] [place]   CC: Follow up peripheral artery occlusive disease  History of Present Illness [name] is a 82 y.o. male who is s/p ultrasound-guided access via left brachial artery, abdominal aortogram, drug coated balloon angioplasty of right external iliac artery, drug coated balloon angioplasty of right common iliac artery on 11-20-16 by Dr. Trula Slade.   The patient was found to have elevated velocities within his right external iliac artery, the inflow source to his right to left femoral-femoral bypass graft.  Pt is also s/p right external iliac artery stent placement on 04-24-16 by Dr. Trula Slade. He also has a history of multiple lower extremity revascularization procedures including a right to left femoral-femoral bypass graft, bilateral femoral-popliteal bypass grafts, and a right common iliac stent in November 2017.  Wife states his feet stay cold for years.   Wife states that pt just does not want to walk other than for ADL's, he does not seem to elicit claudication symptoms, but wife states she is walking with him daily to get him to walk, she states he uses his walker to sit when he gets tired.   Wife states pt has Alzheimer's Disease which has worsened. She is is caregiver, she does have help in that a person comes in their home to help him dress every morning.   Dr. Einar Gip is pt's cardiologist, wife states pt has CHF, denies any known history of MI.  Wife also denies any known history of pt having a stroke or TIA.  Pt Diabetic: "prediabetic" Pt smoker: former smoker, quit in the 1990's  Pt meds include: Statin :Yes Betablocker: Yes ASA: Yes Other anticoagulants/antiplatelets: Plavix    Past Medical History:  Diagnosis Date  . Alzheimer's dementia 2013  . Anemia    HX OF ANEMIA  . CHF (congestive heart failure) ([place])   . Colon cancer ([place])   . COPD (chronic obstructive pulmonary disease) ([place])   . Depression   . Dyspnea   .  Hyperlipidemia   . Hypertension   . Pneumonia 2014  . PVD (peripheral vascular disease) ([place])   . Type II diabetes mellitus ([place])     Social History Social History   Tobacco Use  . Smoking status: Former Smoker    Packs/day: 0.30    Years: 40.00    Pack years: 12.00    Types: Cigarettes    Last attempt to quit: 03/26/1993    Years since quitting: 23.9  . Smokeless tobacco: Never Used  Substance Use Topics  . Alcohol use: No  . Drug use: No    Family History Family History  Problem Relation Age of Onset  . Stroke Mother 65  . Hypertension Mother   . Cancer Sister   . Heart disease Sister   . Heart disease Brother        Heart Disease before age 74  . Heart attack Brother   . Cancer Sister   . Cancer Brother     Past Surgical History:  Procedure Laterality Date  . ABDOMINAL ANGIOGRAM N/A 04/20/2013   Procedure: ABDOMINAL ANGIOGRAM;  Surgeon: Angelia Mould, MD;  Location[facility] CATH LAB;  Service: Cardiovascular;  Laterality: N/A;  . ABDOMINAL AORTOGRAM W/LOWER EXTREMITY N/A 11/20/2016   Procedure: ABDOMINAL AORTOGRAM W/LOWER EXTREMITY;  Surgeon: Serafina Mitchell, MD;  Location: [place] CV LAB;  Service: Cardiovascular;  Laterality: N/A;  . CATARACT EXTRACTION Right 02/2013  . CATARACT EXTRACTION W/ INTRAOCULAR LENS IMPLANT Left 01/2013  . COLON RESECTION  1997   Yvette Rack. notes 05/17/2004 (04/28/2013)  . COLON SURGERY  1996   cancer  . ESOPHAGOGASTRODUODENOSCOPY N/A 09/05/2013   Procedure: ESOPHAGOGASTRODUODENOSCOPY (EGD);  Surgeon: Lear Ng, MD;  Location: [facility] ENDOSCOPY;  Service: Endoscopy;  Laterality: N/A;  . FEMORAL ARTERY - FEMORAL ARTERY BYPASS GRAFT     right to left/enc. notes 05/17/2004 (04/28/2013)  . FEMORAL BYPASS Right    /enc. notes 05/17/2004 (04/28/2013)  . FEMORAL-POPLITEAL BYPASS GRAFT Right 04/29/2013   Procedure:  FEMORAL-POPLITEAL ARTERY Bypass Graft with intraoperative ultrasound and arteriogarm;  Surgeon: Mal Misty, MD;  Location: [place];  Service: Vascular;  Laterality: Right;  . FIBEROPTIC BRONCHOSCOPY     Yvette Rack. notes 03/16/2005 (04/28/2013)  . INTRAOPERATIVE ARTERIOGRAM Right 04/29/2013   Procedure: INTRA OPERATIVE ARTERIOGRAM;  Surgeon: Mal Misty, MD;  Location: [place];  Service: Vascular;  Laterality: Right;  . LOWER EXTREMITY ANGIOGRAM  04/20/2013   Procedure: LOWER EXTREMITY ANGIOGRAM;  Surgeon: Angelia Mould, MD;  Location: [facility] CATH LAB;  Service: Cardiovascular;;  . LUMBAR [place] SURGERY  2005  . PERCUTANEOUS STENT INTERVENTION  04/20/2013   Procedure: PERCUTANEOUS STENT INTERVENTION;  Surgeon: Angelia Mould, MD;  Location: [facility] CATH LAB;  Service: Cardiovascular;;  right common iliac artery  . PERIPHERAL VASCULAR CATHETERIZATION N/A 01/31/2016   Procedure: Abdominal Aortogram w/Lower Extremity;  Surgeon: Serafina Mitchell, MD;  Location: [place] CV LAB;  Service: Cardiovascular;  Laterality: N/A;  . PERIPHERAL VASCULAR CATHETERIZATION Right 01/31/2016   Procedure: Peripheral Vascular Atherectomy;  Surgeon: Serafina Mitchell, MD;  Location: [place] CV LAB;  Service: Cardiovascular;  Laterality: Right;  Iliac common and external  . PERIPHERAL VASCULAR CATHETERIZATION Right 01/31/2016   Procedure: Peripheral Vascular Intervention;  Surgeon: Serafina Mitchell, MD;  Location: [place] CV LAB;  Service: Cardiovascular;  Laterality: Right;  external iliac and common iliac  . PERIPHERAL VASCULAR CATHETERIZATION N/A 04/24/2016   Procedure: Abdominal Aortogram w/Lower Extremity;  Surgeon: Serafina Mitchell, MD;  Location: [place] CV LAB;  Service: Cardiovascular;  Laterality: N/A;  . PERIPHERAL VASCULAR CATHETERIZATION Right 04/24/2016   Procedure: Peripheral Vascular Intervention;  Surgeon: Serafina Mitchell, MD;  Location: [place] CV LAB;  Service: Cardiovascular;  Laterality: Right;  external illiac Rt  . SPINE SURGERY    . VASCULAR SURGERY      No Known Allergies  Current Outpatient Medications   Medication Sig Dispense Refill  . albuterol (PROVENTIL HFA;VENTOLIN HFA) 108 (90 BASE) MCG/ACT inhaler Inhale 2 puffs into the lungs every 6 (six) hours as needed for shortness of breath. 1 Inhaler 5  . albuterol (PROVENTIL) (2.5 MG/3ML) 0.083% nebulizer solution Inhale 3 mLs into the lungs every 6 (six) hours as needed for wheezing or shortness of breath.     . Cholecalciferol (VITAMIN D) 2000 UNITS CAPS Take 2,000 Units by mouth daily.     . clopidogrel (PLAVIX) 75 MG tablet Take 1 tablet (75 mg total) by mouth daily. 30 tablet 11  . digoxin (LANOXIN) 0.125 MG tablet Take 0.125 mg by mouth.     . famotidine (PEPCID) 20 MG tablet One at bedtime 30 tablet 2  . Fluticasone-Umeclidin-Vilant (TRELEGY ELLIPTA) 100-62.5-25 MCG/INH AEPB Inhale 1 puff into the lungs daily. 2 each 0  . furosemide (LASIX) 20 MG tablet Take 1 tablet (20 mg total) by mouth daily. 5 tablet 0  . metoprolol tartrate (LOPRESSOR) 25 MG tablet Take 0.5 tablets (12.5 mg total) by mouth 2 (two) times daily. 30 tablet 0  .  pantoprazole (PROTONIX) 40 MG tablet Take 1 tablet (40 mg total) daily by mouth. Take 30-60 min before first meal of the day 30 tablet 2  . pravastatin (PRAVACHOL) 40 MG tablet Take 40 mg by mouth daily after supper.     . sitaGLIPtin (JANUVIA) 100 MG tablet Take 100 mg by mouth daily.     No current facility-administered medications for this visit.     ROS: See HPI for pertinent positives and negatives.   Physical Examination  Vitals:   02/20/17 0918 02/20/17 0919 02/20/17 0920  BP: (!) 175/90 (!) 165/84 (!) 150/78  Pulse: 64    Resp: 17    Temp: (!) 97.1 F (36.2 C)    TempSrc: Oral    SpO2: 95%    Weight: 163 lb 11.2 oz (74.3 kg)     Body mass index is 24.17 kg/m.  General: A&O to person only, WDWN. Gait: slow, shuffling, using walker with wife a standby assistance.   Eyes: Pupils are equal Pulmonary: Respirations are non labored at rest, limited inspiratory effort, limited air movement in  all fields, no rales, rhonchi, or wheezing. Cardiac: regular rhythm and rate, no detected murmur.     Carotid Bruits Right Left   Negative Negative   Abdominal aortic pulse is not palpable. Radial pulses: are palpable   VASCULAR EXAM: Extremitieswithout ischemic changes, without gangrene; without open wounds. No peripheral edema. Fem to fem bypass graft pulse is palpable.     LE Pulses Right Left   FEMORAL 1+palpable 1+palpable    POPLITEAL not palpable  not palpable   POSTERIOR TIBIAL not palpable  not palpable    DORSALIS PEDIS  ANTERIOR TIBIAL not palpable  not palpable    Abdomen: soft, NT, no palpable masses. Skin: no rashes, no ulcers. Musculoskeletal: no muscle wasting or atrophy. Neurologic: A&O X 1; Responds to commands appropriately. Falls asleep quickly when not engaged.      ASSESSMENT: [name] is a 82 y.o. male who presents s/p angioplasty of right external iliac artery, and angioplasty of right common iliac artery on 11-20-16.  He is also s/p right external iliac artery stent placement on 04-24-16. He also has a history of multiple lower extremity revascularization procedures including a right to left femoral-femoral bypass graft, bilateral femoral-popliteal bypass grafts, and a right common iliac stent in November 2017.  Wife states his feet have stayed cold for years, and that his dementia is worsening. He is oriented to person only.   I discussed the occlusion of both femoropopliteal bypass grafts with Dr. Trula Slade at pt's 07-09-16 visit. Pt has no symptoms of claudication and no signs of ischemia in his feet or legs. In fact, wife states pt's feet are less cold since the last procedure on 04-24-16. Pt does not ambulate much  at his choice, though wife tries to get him to walk with her as much as possible. Pt also has advanced dementia.  Pt has had a silent occlusion of both femoropopliteal bypass grafts, is asymptomatic.    Wife states she has been caring for her husband for 22 years with dementia; she is having trouble caring for him as his dementia has continued to advance, and is looking into placement into an appropriate facility.   DATA  Fem-fem bypass graft duplex (02/20/17): Right to left femoral bypass graft with elevated velocities in the native inflow (297 cm/c) (was 411 cm/s on 10-10-16) and outflow artery (211 cm/c), (50-74%). This is the first post operative duplex.  PLAN:  Based on the patient's vascular studies and examination, pt will return to clinic in 3 months with fem-fem bypass graft duplex and ABI's.   I discussed in depth with the patient the nature of atherosclerosis, and emphasized the importance of maximal medical management including strict control of blood pressure, blood glucose, and lipid levels, obtaining regular exercise, and continued cessation of smoking.  The patient is aware that without maximal medical management the underlying atherosclerotic disease process will progress, limiting the benefit of any interventions.  The patient was given information about PAD including signs, symptoms, treatment, what symptoms should prompt the patient to seek immediate medical care, and risk reduction measures to take.  Edwin Chambers, RN, MSN, FNP-C [facility] Phone: [phone]  Clinic MD: Trula Slade  02/20/17 9:29 AM

## 2017-02-21 NOTE — Addendum Note (Signed)
Addended by: Lianne Cure A on: 02/21/2017 12:48 PM   Modules accepted: Orders

## 2017-02-28 DIAGNOSIS — R350 Frequency of micturition: Secondary | ICD-10-CM | POA: Diagnosis not present

## 2017-03-29 ENCOUNTER — Encounter: Payer: PPO | Admitting: Adult Health

## 2017-04-02 ENCOUNTER — Ambulatory Visit (INDEPENDENT_AMBULATORY_CARE_PROVIDER_SITE_OTHER): Payer: PPO | Admitting: Adult Health

## 2017-04-02 ENCOUNTER — Encounter: Payer: Self-pay | Admitting: Adult Health

## 2017-04-02 DIAGNOSIS — I5022 Chronic systolic (congestive) heart failure: Secondary | ICD-10-CM

## 2017-04-02 DIAGNOSIS — J449 Chronic obstructive pulmonary disease, unspecified: Secondary | ICD-10-CM | POA: Diagnosis not present

## 2017-04-02 MED ORDER — FLUTICASONE-UMECLIDIN-VILANT 100-62.5-25 MCG/INH IN AEPB: 1.0000 | INHALATION_SPRAY | Freq: Every day | RESPIRATORY_TRACT | 5 refills | Status: DC

## 2017-04-02 MED ORDER — FLUTICASONE-UMECLIDIN-VILANT 100-62.5-25 MCG/INH IN AEPB: 1.0000 | INHALATION_SPRAY | Freq: Every day | RESPIRATORY_TRACT | 0 refills | Status: DC

## 2017-04-02 NOTE — Progress Notes (Signed)
@Patient  ID: [name], male    DOB: [birth year], 83 y.o.   MRN: [id number]  Chief Complaint  Patient presents with  . Follow-up    COPD     Referring provider: Glendale Chard, MD  HPI: 84  male former smoker followed for COPD GOLD II w/ reversibility   TEST  PFT's 11/18/00 FEV1 is 1.94 or 60%  - PFT's 01/06/09 FEV1 1.75 (57%) ratio 49 and 21 % better FVC after B2 DLC0 71  -2D echo June 2015 EF 20-25% with diffuse hypokinesis , grade 2 diastolic dysfunction, pulmonary artery pressure 39 mmHg  04/02/2017 Follow up ; COPD  Patient presents for a 73-month follow-up. . Patient has known moderate COPD. Says overall breathing is doing okay without flare of wheezing , cough or dyspnea.  Pt has advanced dementia and it is hard for wife to get him to take the inhalers at times.  Appetite is good, no wt loss.  He is taking TRELEGY , needs refills to pharmacy .   Has CHF  . Denies orthopnea or increased leg swelling .   No Known Allergies  Immunization History  Administered Date(s) Administered  . Influenza Whole 12/25/2010, 12/24/2016  . Influenza, High Dose Seasonal PF 02/10/2014, 12/28/2015  . Influenza,inj,Quad PF,6+ Mos 01/27/2014, 12/22/2014  . Influenza-Unspecified 11/24/2012  . Pneumococcal Conjugate-13 09/23/2013  . Pneumococcal-Unspecified 09/04/1999    Past Medical History:  Diagnosis Date  . Alzheimer's dementia 2013  . Anemia    HX OF ANEMIA  . CHF (congestive heart failure) ([place])   . Colon cancer ([place])   . COPD (chronic obstructive pulmonary disease) ([place])   . Depression   . Dyspnea   . Hyperlipidemia   . Hypertension   . Pneumonia 2014  . PVD (peripheral vascular disease) ([place])   . Type II diabetes mellitus ([facility])     Tobacco History: Social History   Tobacco Use  Smoking Status Former Smoker  . Packs/day: 0.30  . Years: 40.00  . Pack years: 12.00  . Types: Cigarettes  . Last attempt to quit: 03/26/1993  . Years since quitting: 24.0  Smokeless  Tobacco Never Used   Counseling given: Not Answered   Outpatient Encounter Medications as of 04/02/2017  Medication Sig  . albuterol (PROVENTIL HFA;VENTOLIN HFA) 108 (90 BASE) MCG/ACT inhaler Inhale 2 puffs into the lungs every 6 (six) hours as needed for shortness of breath.  Marland Kitchen albuterol (PROVENTIL) (2.5 MG/3ML) 0.083% nebulizer solution Inhale 3 mLs into the lungs every 6 (six) hours as needed for wheezing or shortness of breath.   . Cholecalciferol (VITAMIN D) 2000 UNITS CAPS Take 2,000 Units by mouth daily.   . clopidogrel (PLAVIX) 75 MG tablet Take 1 tablet (75 mg total) by mouth daily.  . digoxin (LANOXIN) 0.125 MG tablet Take 0.125 mg by mouth.   . famotidine (PEPCID) 20 MG tablet One at bedtime  . Fluticasone-Umeclidin-Vilant (TRELEGY ELLIPTA) 100-62.5-25 MCG/INH AEPB Inhale 1 puff into the lungs daily.  . furosemide (LASIX) 20 MG tablet Take 1 tablet (20 mg total) by mouth daily.  . metoprolol tartrate (LOPRESSOR) 25 MG tablet Take 0.5 tablets (12.5 mg total) by mouth 2 (two) times daily.  . pantoprazole (PROTONIX) 40 MG tablet Take 1 tablet (40 mg total) daily by mouth. Take 30-60 min before first meal of the day  . pravastatin (PRAVACHOL) 40 MG tablet Take 40 mg by mouth daily after supper.   . sitaGLIPtin (JANUVIA) 100 MG tablet Take 100 mg by mouth daily.  No facility-administered encounter medications on file as of 04/02/2017.      Review of Systems  Constitutional:   No  weight loss, night sweats,  Fevers, chills, fatigue, or  lassitude.  HEENT:   No headaches,  Difficulty swallowing,  Tooth/dental problems, or  Sore throat,                No sneezing, itching, ear ache, nasal congestion, post nasal drip,   CV:  No chest pain,  Orthopnea, PND, swelling in lower extremities, anasarca, dizziness, palpitations, syncope.   GI  No heartburn, indigestion, abdominal pain, nausea, vomiting, diarrhea, change in bowel habits, loss of appetite, bloody stools.   Resp: No shortness  of breath with exertion or at rest.  No excess mucus, no productive cough,  No non-productive cough,  No coughing up of blood.  No change in color of mucus.  No wheezing.  No chest wall deformity  Skin: no rash or lesions.  GU: no dysuria, change in color of urine, no urgency or frequency.  No flank pain, no hematuria   MS:  No joint pain or swelling.  No decreased range of motion.  No back pain.    Physical Exam  BP 132/66 (BP Location: Left Arm, Cuff Size: Normal)   Pulse (!) 59   Ht 5\' 6"  (1.676 m)   Wt 171 lb 9.6 oz (77.8 kg)   SpO2 98%   BMI 27.70 kg/m   GEN: A/Ox3; pleasant , NAD, elderly    HEENT:  [place]/AT,  EACs-clear, TMs-wnl, NOSE-clear, THROAT-clear, no lesions, no postnasal drip or exudate noted.   NECK:  Supple w/ fair ROM; no JVD; normal carotid impulses w/o bruits; no thyromegaly or nodules palpated; no lymphadenopathy.    RESP  Clear  P & A; w/o, wheezes/ rales/ or rhonchi. no accessory muscle use, no dullness to percussion  CARD:  RRR, no m/r/g, no peripheral edema, pulses intact, no cyanosis or clubbing.  GI:   Soft & nt; nml bowel sounds; no organomegaly or masses detected.   Musco: Warm bil, no deformities or joint swelling noted.   Neuro: alert, no focal deficits noted.    Skin: Warm, no lesions or rashes    Lab Results:  CBC   BNPImaging: No results found.   Assessment & Plan:   COPD COPD II  Has Moderate COPD  Well controlled without flare   Plan . Patient Instructions  Continue on TRELEGY 1 puff daily , rinse after use.  Follow up with Dr. Melvyn Novas  In 6 months and As needed        Chronic systolic CHF (congestive heart failure) Appears compensated without evidence of volume overload on exam.  Cont on current regimen .       Rexene Edison, NP 04/02/2017

## 2017-04-02 NOTE — Patient Instructions (Signed)
Continue on TRELEGY 1 puff daily , rinse after use.  Follow up with Dr. Melvyn Novas  In 6 months and As needed

## 2017-04-02 NOTE — Assessment & Plan Note (Signed)
Has Moderate COPD  Well controlled without flare   Plan . Patient Instructions  Continue on TRELEGY 1 puff daily , rinse after use.  Follow up with Dr. Melvyn Novas  In 6 months and As needed

## 2017-04-02 NOTE — Assessment & Plan Note (Signed)
Appears compensated without evidence of volume overload on exam.  Cont on current regimen  

## 2017-04-02 NOTE — Addendum Note (Signed)
Addended by: Parke Poisson E on: 04/02/2017 04:36 PM   Modules accepted: Orders

## 2017-04-03 DIAGNOSIS — Z85038 Personal history of other malignant neoplasm of large intestine: Secondary | ICD-10-CM | POA: Diagnosis not present

## 2017-04-03 DIAGNOSIS — G301 Alzheimer's disease with late onset: Secondary | ICD-10-CM | POA: Diagnosis not present

## 2017-04-03 NOTE — Progress Notes (Signed)
Chart and office note reviewed in detail  > agree with a/p as outlined    

## 2017-04-04 ENCOUNTER — Telehealth: Payer: Self-pay | Admitting: Internal Medicine

## 2017-04-04 NOTE — Telephone Encounter (Signed)
PA for trelegy was initiated today- KEY [id number] to the plan for review

## 2017-04-09 DIAGNOSIS — Z79899 Other long term (current) drug therapy: Secondary | ICD-10-CM | POA: Diagnosis not present

## 2017-04-09 DIAGNOSIS — I129 Hypertensive chronic kidney disease with stage 1 through stage 4 chronic kidney disease, or unspecified chronic kidney disease: Secondary | ICD-10-CM | POA: Diagnosis not present

## 2017-04-09 DIAGNOSIS — N182 Chronic kidney disease, stage 2 (mild): Secondary | ICD-10-CM | POA: Diagnosis not present

## 2017-04-09 DIAGNOSIS — E1122 Type 2 diabetes mellitus with diabetic chronic kidney disease: Secondary | ICD-10-CM | POA: Diagnosis not present

## 2017-04-09 DIAGNOSIS — N08 Glomerular disorders in diseases classified elsewhere: Secondary | ICD-10-CM | POA: Diagnosis not present

## 2017-04-09 NOTE — Telephone Encounter (Signed)
Dr. Melvyn Novas, PA initiated for patients Trelegy was denied. Please advise if you would like for this to be appealed or if you would like to switch to a new medication

## 2017-04-09 NOTE — Telephone Encounter (Signed)
It's the same as BREO 100 one each am/ Incruse One each am - if they won't cover that then ok to try PA

## 2017-04-16 ENCOUNTER — Telehealth: Payer: Self-pay | Admitting: Adult Health

## 2017-04-16 NOTE — Telephone Encounter (Signed)
Left message for [name] to call back. Per the patient's chart, MW is aware that Trelegy was denied and offered to switch the patient to Breo 100 or Incruse if the insurance covered it. Insurance was never checked.   Called [facility] and spoke with the pharmacist. Memory [facility] 100 is covered for $40 a month. Incruse is not covered at all.   Will wait for [facility] to call back.

## 2017-04-17 MED ORDER — FLUTICASONE FUROATE-VILANTEROL 100-25 MCG/INH IN AEPB: 1.0000 | INHALATION_SPRAY | Freq: Every day | RESPIRATORY_TRACT | 4 refills | Status: DC

## 2017-04-17 NOTE — Telephone Encounter (Signed)
Pt took his last dose of medication today Symbicort. Pt wants to know what other medication is he taking. She know that the insurance is not paying or Trelegy   [phone]

## 2017-04-17 NOTE — Telephone Encounter (Signed)
Spoke with [name]. Advised her that MW would like to switch the patient to Breo 100 since the medication is covered by his insurance. She verbalized understanding. Medication has been sent to [facility].   Nothing else needed at time of call.

## 2017-04-26 ENCOUNTER — Other Ambulatory Visit: Payer: Self-pay | Admitting: *Deleted

## 2017-04-26 DIAGNOSIS — J449 Chronic obstructive pulmonary disease, unspecified: Secondary | ICD-10-CM

## 2017-04-26 MED ORDER — PANTOPRAZOLE SODIUM 40 MG PO TBEC: 40.0000 mg | DELAYED_RELEASE_TABLET | Freq: Every day | ORAL | 1 refills | Status: DC

## 2017-04-26 MED ORDER — FAMOTIDINE 20 MG PO TABS: ORAL_TABLET | ORAL | 1 refills | Status: AC

## 2017-05-02 ENCOUNTER — Encounter (HOSPITAL_COMMUNITY): Payer: Self-pay | Admitting: Emergency Medicine

## 2017-05-02 ENCOUNTER — Emergency Department (HOSPITAL_COMMUNITY): Payer: PPO

## 2017-05-02 ENCOUNTER — Other Ambulatory Visit: Payer: Self-pay

## 2017-05-02 ENCOUNTER — Inpatient Hospital Stay (HOSPITAL_COMMUNITY)
Admission: EM | Admit: 2017-05-02 | Discharge: 2017-05-05 | DRG: 871 | Disposition: A | Discharge status: Home Health | Payer: PPO | Attending: Internal Medicine | Admitting: Internal Medicine

## 2017-05-02 DIAGNOSIS — F0390 Unspecified dementia without behavioral disturbance: Secondary | ICD-10-CM | POA: Diagnosis not present

## 2017-05-02 DIAGNOSIS — R030 Elevated blood-pressure reading, without diagnosis of hypertension: Secondary | ICD-10-CM | POA: Diagnosis not present

## 2017-05-02 DIAGNOSIS — Z8701 Personal history of pneumonia (recurrent): Secondary | ICD-10-CM | POA: Diagnosis not present

## 2017-05-02 DIAGNOSIS — I5042 Chronic combined systolic (congestive) and diastolic (congestive) heart failure: Secondary | ICD-10-CM | POA: Diagnosis not present

## 2017-05-02 DIAGNOSIS — R05 Cough: Secondary | ICD-10-CM | POA: Diagnosis not present

## 2017-05-02 DIAGNOSIS — Z85038 Personal history of other malignant neoplasm of large intestine: Secondary | ICD-10-CM

## 2017-05-02 DIAGNOSIS — Z79899 Other long term (current) drug therapy: Secondary | ICD-10-CM | POA: Diagnosis not present

## 2017-05-02 DIAGNOSIS — A419 Sepsis, unspecified organism: Principal | ICD-10-CM

## 2017-05-02 DIAGNOSIS — I5022 Chronic systolic (congestive) heart failure: Secondary | ICD-10-CM

## 2017-05-02 DIAGNOSIS — Z7902 Long term (current) use of antithrombotics/antiplatelets: Secondary | ICD-10-CM

## 2017-05-02 DIAGNOSIS — F0280 Dementia in other diseases classified elsewhere without behavioral disturbance: Secondary | ICD-10-CM | POA: Diagnosis present

## 2017-05-02 DIAGNOSIS — I739 Peripheral vascular disease, unspecified: Secondary | ICD-10-CM | POA: Diagnosis not present

## 2017-05-02 DIAGNOSIS — Z8249 Family history of ischemic heart disease and other diseases of the circulatory system: Secondary | ICD-10-CM

## 2017-05-02 DIAGNOSIS — J189 Pneumonia, unspecified organism: Secondary | ICD-10-CM | POA: Diagnosis not present

## 2017-05-02 DIAGNOSIS — R4182 Altered mental status, unspecified: Secondary | ICD-10-CM | POA: Diagnosis not present

## 2017-05-02 DIAGNOSIS — F329 Major depressive disorder, single episode, unspecified: Secondary | ICD-10-CM | POA: Diagnosis not present

## 2017-05-02 DIAGNOSIS — Z7984 Long term (current) use of oral hypoglycemic drugs: Secondary | ICD-10-CM | POA: Diagnosis not present

## 2017-05-02 DIAGNOSIS — N3000 Acute cystitis without hematuria: Secondary | ICD-10-CM

## 2017-05-02 DIAGNOSIS — Z86718 Personal history of other venous thrombosis and embolism: Secondary | ICD-10-CM | POA: Diagnosis not present

## 2017-05-02 DIAGNOSIS — Z9049 Acquired absence of other specified parts of digestive tract: Secondary | ICD-10-CM

## 2017-05-02 DIAGNOSIS — I110 Hypertensive heart disease with heart failure: Secondary | ICD-10-CM | POA: Diagnosis present

## 2017-05-02 DIAGNOSIS — G9340 Encephalopathy, unspecified: Secondary | ICD-10-CM

## 2017-05-02 DIAGNOSIS — J181 Lobar pneumonia, unspecified organism: Secondary | ICD-10-CM | POA: Diagnosis not present

## 2017-05-02 DIAGNOSIS — Z961 Presence of intraocular lens: Secondary | ICD-10-CM | POA: Diagnosis present

## 2017-05-02 DIAGNOSIS — J1000 Influenza due to other identified influenza virus with unspecified type of pneumonia: Secondary | ICD-10-CM | POA: Diagnosis not present

## 2017-05-02 DIAGNOSIS — G9349 Other encephalopathy: Secondary | ICD-10-CM | POA: Diagnosis present

## 2017-05-02 DIAGNOSIS — G309 Alzheimer's disease, unspecified: Secondary | ICD-10-CM | POA: Diagnosis present

## 2017-05-02 DIAGNOSIS — E785 Hyperlipidemia, unspecified: Secondary | ICD-10-CM | POA: Diagnosis not present

## 2017-05-02 DIAGNOSIS — E1151 Type 2 diabetes mellitus with diabetic peripheral angiopathy without gangrene: Secondary | ICD-10-CM | POA: Diagnosis present

## 2017-05-02 DIAGNOSIS — J440 Chronic obstructive pulmonary disease with acute lower respiratory infection: Secondary | ICD-10-CM | POA: Diagnosis not present

## 2017-05-02 DIAGNOSIS — Z95828 Presence of other vascular implants and grafts: Secondary | ICD-10-CM | POA: Diagnosis not present

## 2017-05-02 DIAGNOSIS — J101 Influenza due to other identified influenza virus with other respiratory manifestations: Secondary | ICD-10-CM | POA: Diagnosis not present

## 2017-05-02 DIAGNOSIS — N390 Urinary tract infection, site not specified: Secondary | ICD-10-CM | POA: Diagnosis not present

## 2017-05-02 DIAGNOSIS — J188 Other pneumonia, unspecified organism: Secondary | ICD-10-CM | POA: Diagnosis not present

## 2017-05-02 DIAGNOSIS — Z87891 Personal history of nicotine dependence: Secondary | ICD-10-CM | POA: Diagnosis not present

## 2017-05-02 DIAGNOSIS — I10 Essential (primary) hypertension: Secondary | ICD-10-CM

## 2017-05-02 LAB — CBC
HEMATOCRIT: 36.5 % — AB (ref 39.0–52.0)
HEMATOCRIT: 36.5 % — AB (ref 39.0–52.0)
Hemoglobin: 11.5 g/dL — ABNORMAL LOW (ref 13.0–17.0)
Hemoglobin: 11.8 g/dL — ABNORMAL LOW (ref 13.0–17.0)
MCH: 27.7 pg (ref 26.0–34.0)
MCH: 28.4 pg (ref 26.0–34.0)
MCHC: 31.5 g/dL (ref 30.0–36.0)
MCHC: 32.3 g/dL (ref 30.0–36.0)
MCV: 88 fL (ref 78.0–100.0)
MCV: 88 fL (ref 78.0–100.0)
PLATELETS: 270 10*3/uL (ref 150–400)
Platelets: 255 10*3/uL (ref 150–400)
RBC: 4.15 MIL/uL — ABNORMAL LOW (ref 4.22–5.81)
RBC: 4.15 MIL/uL — ABNORMAL LOW (ref 4.22–5.81)
RDW: 15.1 % (ref 11.5–15.5)
RDW: 15.4 % (ref 11.5–15.5)
WBC: 7.6 10*3/uL (ref 4.0–10.5)
WBC: 6.9 10*3/uL (ref 4.0–10.5)

## 2017-05-02 LAB — COMPREHENSIVE METABOLIC PANEL
ALK PHOS: 76 U/L (ref 38–126)
ALK PHOS: 71 U/L (ref 38–126)
ALT: 11 U/L — ABNORMAL LOW (ref 17–63)
ALT: 10 U/L — AB (ref 17–63)
ANION GAP: 13 (ref 5–15)
AST: 27 U/L (ref 15–41)
AST: 19 U/L (ref 15–41)
Albumin: 3.7 g/dL (ref 3.5–5.0)
Albumin: 3.1 g/dL — ABNORMAL LOW (ref 3.5–5.0)
Anion gap: 11 (ref 5–15)
BILIRUBIN TOTAL: 1 mg/dL (ref 0.3–1.2)
BUN: 16 mg/dL (ref 6–20)
BUN: 14 mg/dL (ref 6–20)
CALCIUM: 9.5 mg/dL (ref 8.9–10.3)
CALCIUM: 8.5 mg/dL — AB (ref 8.9–10.3)
CO2: 24 mmol/L (ref 22–32)
CO2: 23 mmol/L (ref 22–32)
CREATININE: 1.29 mg/dL — AB (ref 0.61–1.24)
CREATININE: 1.22 mg/dL (ref 0.61–1.24)
Chloride: 102 mmol/L (ref 101–111)
Chloride: 104 mmol/L (ref 101–111)
GFR, EST AFRICAN AMERICAN: 59 mL/min — AB (ref >60)
GFR, EST NON AFRICAN AMERICAN: 51 mL/min — AB (ref >60)
GFR, EST NON AFRICAN AMERICAN: 54 mL/min — AB (ref >60)
Glucose, Bld: 156 mg/dL — ABNORMAL HIGH (ref 65–99)
Glucose, Bld: 157 mg/dL — ABNORMAL HIGH (ref 65–99)
Potassium: 4.9 mmol/L (ref 3.5–5.1)
Potassium: 4.2 mmol/L (ref 3.5–5.1)
SODIUM: 139 mmol/L (ref 135–145)
SODIUM: 138 mmol/L (ref 135–145)
TOTAL PROTEIN: 7.6 g/dL (ref 6.5–8.1)
TOTAL PROTEIN: 6.4 g/dL — AB (ref 6.5–8.1)
Total Bilirubin: 0.7 mg/dL (ref 0.3–1.2)

## 2017-05-02 LAB — URINALYSIS, COMPLETE (UACMP) WITH MICROSCOPIC
Bilirubin Urine: NEGATIVE
GLUCOSE, UA: NEGATIVE mg/dL
Ketones, ur: NEGATIVE mg/dL
NITRITE: NEGATIVE
PROTEIN: 30 mg/dL — AB
SPECIFIC GRAVITY, URINE: 1.012 (ref 1.005–1.030)
pH: 5 (ref 5.0–8.0)

## 2017-05-02 LAB — LACTIC ACID, PLASMA: LACTIC ACID, VENOUS: 1.6 mmol/L (ref 0.5–1.9)

## 2017-05-02 LAB — STREP PNEUMONIAE URINARY ANTIGEN: Strep Pneumo Urinary Antigen: NEGATIVE

## 2017-05-02 LAB — PROCALCITONIN

## 2017-05-02 LAB — INFLUENZA PANEL BY PCR (TYPE A & B)
INFLBPCR: NEGATIVE
Influenza A By PCR: POSITIVE — AB

## 2017-05-02 LAB — PROTIME-INR
INR: 1.05
PROTHROMBIN TIME: 13.6 s (ref 11.4–15.2)

## 2017-05-02 LAB — I-STAT CG4 LACTIC ACID, ED
LACTIC ACID, VENOUS: 2.54 mmol/L — AB (ref 0.5–1.9)
Lactic Acid, Venous: 1.54 mmol/L (ref 0.5–1.9)

## 2017-05-02 LAB — CBG MONITORING, ED: Glucose-Capillary: 123 mg/dL — ABNORMAL HIGH (ref 65–99)

## 2017-05-02 LAB — APTT: aPTT: 29 seconds (ref 24–36)

## 2017-05-02 MED ORDER — VITAMIN D 1000 UNITS PO TABS
2000.0000 [IU] | ORAL_TABLET | Freq: Every day | ORAL | Status: DC
  Administered 2017-05-03 – 2017-05-05 (×3): 2000 [IU] via ORAL
  Filled 2017-05-02 (×3): qty 2

## 2017-05-02 MED ORDER — INSULIN ASPART 100 UNIT/ML ~~LOC~~ SOLN
0.0000 [IU] | Freq: Three times a day (TID) | SUBCUTANEOUS | Status: DC
  Administered 2017-05-04 (×2): 1 [IU] via SUBCUTANEOUS

## 2017-05-02 MED ORDER — HYDRALAZINE HCL 20 MG/ML IJ SOLN
10.0000 mg | Freq: Four times a day (QID) | INTRAMUSCULAR | Status: DC | PRN
  Administered 2017-05-03 – 2017-05-04 (×2): 10 mg via INTRAVENOUS
  Filled 2017-05-02 (×2): qty 1

## 2017-05-02 MED ORDER — FLUTICASONE FUROATE-VILANTEROL 100-25 MCG/INH IN AEPB
1.0000 | INHALATION_SPRAY | Freq: Every day | RESPIRATORY_TRACT | Status: DC
  Administered 2017-05-03 – 2017-05-04 (×2): 1 via RESPIRATORY_TRACT
  Filled 2017-05-02: qty 28

## 2017-05-02 MED ORDER — ALBUTEROL SULFATE HFA 108 (90 BASE) MCG/ACT IN AERS: 2.0000 | INHALATION_SPRAY | Freq: Four times a day (QID) | RESPIRATORY_TRACT | Status: DC | PRN

## 2017-05-02 MED ORDER — ACETAMINOPHEN 325 MG RE SUPP: 325.0000 mg | Freq: Once | RECTAL | Status: DC

## 2017-05-02 MED ORDER — METOPROLOL TARTRATE 12.5 MG HALF TABLET
12.5000 mg | ORAL_TABLET | Freq: Two times a day (BID) | ORAL | Status: DC
  Administered 2017-05-02 – 2017-05-05 (×6): 12.5 mg via ORAL
  Filled 2017-05-02 (×6): qty 1

## 2017-05-02 MED ORDER — ACETAMINOPHEN 325 MG PO TABS
650.0000 mg | ORAL_TABLET | Freq: Four times a day (QID) | ORAL | Status: DC | PRN
  Administered 2017-05-03 – 2017-05-04 (×2): 650 mg via ORAL
  Filled 2017-05-02 (×2): qty 2

## 2017-05-02 MED ORDER — CEFTRIAXONE SODIUM 1 G IJ SOLR
1.0000 g | Freq: Once | INTRAMUSCULAR | Status: AC
  Administered 2017-05-02: 1 g via INTRAVENOUS
  Filled 2017-05-02: qty 10

## 2017-05-02 MED ORDER — SODIUM CHLORIDE 0.9 % IV SOLN
INTRAVENOUS | Status: DC
  Administered 2017-05-02 – 2017-05-04 (×3): via INTRAVENOUS

## 2017-05-02 MED ORDER — POLYETHYLENE GLYCOL 3350 17 G PO PACK: 17.0000 g | PACK | Freq: Every day | ORAL | Status: DC | PRN

## 2017-05-02 MED ORDER — SODIUM CHLORIDE 0.9 % IV BOLUS (SEPSIS)
500.0000 mL | Freq: Once | INTRAVENOUS | Status: AC
  Administered 2017-05-02: 500 mL via INTRAVENOUS

## 2017-05-02 MED ORDER — CLOPIDOGREL BISULFATE 75 MG PO TABS
75.0000 mg | ORAL_TABLET | Freq: Every day | ORAL | Status: DC
  Administered 2017-05-02 – 2017-05-05 (×4): 75 mg via ORAL
  Filled 2017-05-02 (×4): qty 1

## 2017-05-02 MED ORDER — DEXTROSE 5 % IV SOLN: 500.0000 mg | Freq: Once | INTRAVENOUS | Status: DC

## 2017-05-02 MED ORDER — DIGOXIN 125 MCG PO TABS
0.1250 mg | ORAL_TABLET | Freq: Every day | ORAL | Status: DC
  Administered 2017-05-02 – 2017-05-05 (×4): 0.125 mg via ORAL
  Filled 2017-05-02 (×4): qty 1

## 2017-05-02 MED ORDER — FAMOTIDINE 20 MG PO TABS
20.0000 mg | ORAL_TABLET | Freq: Every day | ORAL | Status: DC
  Administered 2017-05-02 – 2017-05-04 (×3): 20 mg via ORAL
  Filled 2017-05-02 (×3): qty 1

## 2017-05-02 MED ORDER — ONDANSETRON HCL 4 MG/2ML IJ SOLN: 4.0000 mg | Freq: Four times a day (QID) | INTRAMUSCULAR | Status: DC | PRN

## 2017-05-02 MED ORDER — DEXTROSE 5 % IV SOLN
500.0000 mg | Freq: Once | INTRAVENOUS | Status: AC
  Administered 2017-05-02: 500 mg via INTRAVENOUS
  Filled 2017-05-02: qty 500

## 2017-05-02 MED ORDER — DEXTROSE 5 % IV SOLN
1.0000 g | INTRAVENOUS | Status: DC
  Administered 2017-05-03: 1 g via INTRAVENOUS
  Filled 2017-05-02 (×2): qty 10

## 2017-05-02 MED ORDER — ALBUTEROL SULFATE (2.5 MG/3ML) 0.083% IN NEBU: 3.0000 mL | INHALATION_SOLUTION | Freq: Four times a day (QID) | RESPIRATORY_TRACT | Status: DC | PRN

## 2017-05-02 MED ORDER — LACTATED RINGERS IV BOLUS (SEPSIS): 1500.0000 mL | Freq: Once | INTRAVENOUS | Status: DC

## 2017-05-02 MED ORDER — ACETAMINOPHEN 500 MG PO TABS
1000.0000 mg | ORAL_TABLET | Freq: Once | ORAL | Status: AC
  Administered 2017-05-02: 1000 mg via ORAL
  Filled 2017-05-02: qty 2

## 2017-05-02 MED ORDER — DEXTROSE 5 % IV SOLN: 1.0000 g | Freq: Once | INTRAVENOUS | Status: DC

## 2017-05-02 MED ORDER — PANTOPRAZOLE SODIUM 40 MG PO TBEC
40.0000 mg | DELAYED_RELEASE_TABLET | Freq: Every day | ORAL | Status: DC
  Administered 2017-05-02 – 2017-05-05 (×4): 40 mg via ORAL
  Filled 2017-05-02 (×4): qty 1

## 2017-05-02 MED ORDER — ACETAMINOPHEN 650 MG RE SUPP: 650.0000 mg | Freq: Four times a day (QID) | RECTAL | Status: DC | PRN

## 2017-05-02 MED ORDER — DEXTROSE 5 % IV SOLN
500.0000 mg | INTRAVENOUS | Status: DC
  Administered 2017-05-03: 500 mg via INTRAVENOUS
  Filled 2017-05-02 (×2): qty 500

## 2017-05-02 MED ORDER — PRAVASTATIN SODIUM 40 MG PO TABS
40.0000 mg | ORAL_TABLET | Freq: Every day | ORAL | Status: DC
  Administered 2017-05-02 – 2017-05-04 (×3): 40 mg via ORAL
  Filled 2017-05-02 (×3): qty 1

## 2017-05-02 MED ORDER — ENOXAPARIN SODIUM 40 MG/0.4ML ~~LOC~~ SOLN
40.0000 mg | SUBCUTANEOUS | Status: DC
  Administered 2017-05-03 – 2017-05-05 (×3): 40 mg via SUBCUTANEOUS
  Filled 2017-05-02 (×4): qty 0.4

## 2017-05-02 MED ORDER — ONDANSETRON HCL 4 MG PO TABS: 4.0000 mg | ORAL_TABLET | Freq: Four times a day (QID) | ORAL | Status: DC | PRN

## 2017-05-02 NOTE — ED Notes (Signed)
CODE SEPSIS ACTIVATED RN Marzetta Board AWARE

## 2017-05-02 NOTE — H&P (Addendum)
[facility] History and Physical  FELIBERTO [name] IWP:[id number] DOB: Oct 25, 1934 DOA: 05/02/2017  PCP: Glendale Chard, MD  Patient coming from: Home  Chief Complaint: Confusion, increased urinary frequency  HPI: [name] is a 83 y.o. male with a medical history of CHF with an EF of 20-25%, dementia, COPD, depression, presented to the emergency department with complaints of confusion.  Per wife, patient has been a little more confused recently.  He has also been having more urinary frequency.  Patient has also developed cough as well as runny nose.  He has had no complaints of chest pain, abdominal pain, changes in bowel pattern.  Per wife, patient is normally constipated.  He has been having cough without sputum production.  Currently patient does have dementia and is unable to answer most questions.  ED Course: Found to have sepsis secondary to urinary tract infection.  Code sepsis initiated.  Patient placed on ceftriaxone.  Chest x-ray shows possible pneumonia, azithromycin added.  TRH called for admission.  Review of Systems:  All other systems reviewed and are negative.   Past Medical History:  Diagnosis Date  . Alzheimer's dementia 2013  . Anemia    HX OF ANEMIA  . CHF (congestive heart failure) ([place])   . Colon cancer ([place])   . COPD (chronic obstructive pulmonary disease) ([place])   . Depression   . Dyspnea   . Hyperlipidemia   . Hypertension   . Pneumonia 2014  . PVD (peripheral vascular disease) ([place])   . Type II diabetes mellitus ([place])     Past Surgical History:  Procedure Laterality Date  . ABDOMINAL ANGIOGRAM N/A 04/20/2013   Procedure: ABDOMINAL ANGIOGRAM;  Surgeon: Angelia Mould, MD;  Location: [facility] CATH LAB;  Service: Cardiovascular;  Laterality: N/A;  . ABDOMINAL AORTOGRAM W/LOWER EXTREMITY N/A 11/20/2016   Procedure: ABDOMINAL AORTOGRAM W/LOWER EXTREMITY;  Surgeon: Serafina Mitchell, MD;  Location: [place] CV LAB;  Service: Cardiovascular;   Laterality: N/A;  . CATARACT EXTRACTION Right 02/2013  . CATARACT EXTRACTION W/ INTRAOCULAR LENS IMPLANT Left 01/2013  . COLON RESECTION  1997   /enc. notes 05/17/2004 (04/28/2013)  . COLON SURGERY  1996   cancer  . ESOPHAGOGASTRODUODENOSCOPY N/A 09/05/2013   Procedure: ESOPHAGOGASTRODUODENOSCOPY (EGD);  Surgeon: Lear Ng, MD;  Location: [facility] ENDOSCOPY;  Service: Endoscopy;  Laterality: N/A;  . FEMORAL ARTERY - FEMORAL ARTERY BYPASS GRAFT     right to left/enc. notes 05/17/2004 (04/28/2013)  . FEMORAL BYPASS Right    /enc. notes 05/17/2004 (04/28/2013)  . FEMORAL-POPLITEAL BYPASS GRAFT Right 04/29/2013   Procedure:  FEMORAL-POPLITEAL ARTERY Bypass Graft with intraoperative ultrasound and arteriogarm;  Surgeon: Mal Misty, MD;  Location: [place];  Service: Vascular;  Laterality: Right;  . FIBEROPTIC BRONCHOSCOPY     Yvette Rack. notes 03/16/2005 (04/28/2013)  . INTRAOPERATIVE ARTERIOGRAM Right 04/29/2013   Procedure: INTRA OPERATIVE ARTERIOGRAM;  Surgeon: Mal Misty, MD;  Location: [place];  Service: Vascular;  Laterality: Right;  . LOWER EXTREMITY ANGIOGRAM  04/20/2013   Procedure: LOWER EXTREMITY ANGIOGRAM;  Surgeon: Angelia Mould, MD;  Location: [facility] CATH LAB;  Service: Cardiovascular;;  . LUMBAR [place] SURGERY  2005  . PERCUTANEOUS STENT INTERVENTION  04/20/2013   Procedure: PERCUTANEOUS STENT INTERVENTION;  Surgeon: Angelia Mould, MD;  Location: [facility] CATH LAB;  Service: Cardiovascular;;  right common iliac artery  . PERIPHERAL VASCULAR CATHETERIZATION N/A 01/31/2016   Procedure: Abdominal Aortogram w/Lower Extremity;  Surgeon: Serafina Mitchell, MD;  Location: [place] CV LAB;  Service: Cardiovascular;  Laterality: N/A;  . PERIPHERAL VASCULAR CATHETERIZATION Right 01/31/2016   Procedure: Peripheral Vascular Atherectomy;  Surgeon: Serafina Mitchell, MD;  Location: [place] CV LAB;  Service: Cardiovascular;  Laterality: Right;  Iliac common and external  . PERIPHERAL VASCULAR  CATHETERIZATION Right 01/31/2016   Procedure: Peripheral Vascular Intervention;  Surgeon: Serafina Mitchell, MD;  Location: [place] CV LAB;  Service: Cardiovascular;  Laterality: Right;  external iliac and common iliac  . PERIPHERAL VASCULAR CATHETERIZATION N/A 04/24/2016   Procedure: Abdominal Aortogram w/Lower Extremity;  Surgeon: Serafina Mitchell, MD;  Location: [place] CV LAB;  Service: Cardiovascular;  Laterality: N/A;  . PERIPHERAL VASCULAR CATHETERIZATION Right 04/24/2016   Procedure: Peripheral Vascular Intervention;  Surgeon: Serafina Mitchell, MD;  Location: [place] CV LAB;  Service: Cardiovascular;  Laterality: Right;  external illiac Rt  . SPINE SURGERY    . VASCULAR SURGERY      Social History:  reports that he quit smoking about 24 years ago. His smoking use included cigarettes. He has a 12.00 pack-year smoking history. he has never used smokeless tobacco. He reports that he does not drink alcohol or use drugs.   No Known Allergies  Family History  Problem Relation Age of Onset  . Stroke Mother 30  . Hypertension Mother   . Cancer Sister   . Heart disease Sister   . Heart disease Brother        Heart Disease before age 88  . Heart attack Brother   . Cancer Sister   . Cancer Brother      Prior to Admission medications   Medication Sig Start Date End Date Taking? Authorizing Provider  albuterol (PROVENTIL HFA;VENTOLIN HFA) 108 (90 BASE) MCG/ACT inhaler Inhale 2 puffs into the lungs every 6 (six) hours as needed for shortness of breath. 12/22/14   Parrett, Fonnie Mu, NP  albuterol (PROVENTIL) (2.5 MG/3ML) 0.083% nebulizer solution Inhale 3 mLs into the lungs every 6 (six) hours as needed for wheezing or shortness of breath.  10/14/15   [provider]  Cholecalciferol (VITAMIN D) 2000 UNITS CAPS Take 2,000 Units by mouth daily.     [provider]  clopidogrel (PLAVIX) 75 MG tablet Take 1 tablet (75 mg total) by mouth daily. 01/31/16   Serafina Mitchell, MD   digoxin (LANOXIN) 0.125 MG tablet Take 0.125 mg by mouth.     [provider]  famotidine (PEPCID) 20 MG tablet One at bedtime 04/26/17   Tanda Rockers, MD  fluticasone furoate-vilanterol (BREO ELLIPTA) 100-25 MCG/INH AEPB Inhale 1 puff into the lungs daily. 04/17/17   Tanda Rockers, MD  furosemide (LASIX) 20 MG tablet Take 1 tablet (20 mg total) by mouth daily. 10/27/13   Tanna Furry, MD  metoprolol tartrate (LOPRESSOR) 25 MG tablet Take 0.5 tablets (12.5 mg total) by mouth 2 (two) times daily. 09/07/13   Samuella Cota, MD  pantoprazole (PROTONIX) 40 MG tablet Take 1 tablet (40 mg total) by mouth daily. Take 30-60 min before first meal of the day 04/26/17   Tanda Rockers, MD  pravastatin (PRAVACHOL) 40 MG tablet Take 40 mg by mouth daily after supper.     [provider]  sitaGLIPtin (JANUVIA) 100 MG tablet Take 100 mg by mouth daily.    [provider]    Physical Exam: Vitals:   05/02/17 1715 05/02/17 1730  BP: (!) 186/76 (!) 184/62  Pulse: 80 76  Resp: (!) 27 (!) 26  Temp:    SpO2: 97% 99%     General: Well developed, well nourished, NAD, appears stated age  HEENT: NCAT, PERRLA, EOMI, Anicteic Sclera, mucous membranes moist.   Neck: Supple, no JVD, no masses  Cardiovascular: S1 S2 auscultated, no rubs, murmurs or gallops. Regular rate and rhythm.  Respiratory: Clear to auscultation bilaterally with equal chest rise, occasional cough   Abdomen: Soft, nontender, nondistended, + bowel sounds  Extremities: warm dry without cyanosis clubbing. ++ Lower extremity edema bilaterally  Neuro: AAOx1, self only.  Able to move all extremities.  Nonfocal.  Skin: Without rashes exudates or nodules  Psych: pleasant, confused  Labs on Admission: I have personally reviewed following labs and imaging studies CBC: No results for input(s): WBC, NEUTROABS, HGB, HCT, MCV, PLT in the last 168 hours. Basic Metabolic Panel: Recent Labs  Lab 05/02/17 1319  NA  139  K 4.9  CL 102  CO2 24  GLUCOSE 156*  BUN 16  CREATININE 1.29*  CALCIUM 9.5   GFR: Estimated Creatinine Clearance: 50.1 mL/min (A) (by C-G formula based on SCr of 1.29 mg/dL (H)). Liver Function Tests: Recent Labs  Lab 05/02/17 1319  AST 27  ALT 11*  ALKPHOS 76  BILITOT 1.0  PROT 7.6  ALBUMIN 3.7   No results for input(s): LIPASE, AMYLASE in the last 168 hours. No results for input(s): AMMONIA in the last 168 hours. Coagulation Profile: No results for input(s): INR, PROTIME in the last 168 hours. Cardiac Enzymes: No results for input(s): CKTOTAL, CKMB, CKMBINDEX, TROPONINI in the last 168 hours. BNP (last 3 results) No results for input(s): PROBNP in the last 8760 hours. HbA1C: No results for input(s): HGBA1C in the last 72 hours. CBG: Recent Labs  Lab 05/02/17 1542  GLUCAP 123*   Lipid Profile: No results for input(s): CHOL, HDL, LDLCALC, TRIG, CHOLHDL, LDLDIRECT in the last 72 hours. Thyroid Function Tests: No results for input(s): TSH, T4TOTAL, FREET4, T3FREE, THYROIDAB in the last 72 hours. Anemia Panel: No results for input(s): VITAMINB12, FOLATE, FERRITIN, TIBC, IRON, RETICCTPCT in the last 72 hours. Urine analysis:    Component Value Date/Time   COLORURINE YELLOW 05/02/2017 1319   APPEARANCEUR CLOUDY (A) 05/02/2017 1319   LABSPEC 1.012 05/02/2017 1319   PHURINE 5.0 05/02/2017 1319   GLUCOSEU NEGATIVE 05/02/2017 1319   HGBUR MODERATE (A) 05/02/2017 1319   BILIRUBINUR NEGATIVE 05/02/2017 1319   KETONESUR NEGATIVE 05/02/2017 1319   PROTEINUR 30 (A) 05/02/2017 1319   UROBILINOGEN 1.0 12/10/2013 1456   NITRITE NEGATIVE 05/02/2017 1319   LEUKOCYTESUR LARGE (A) 05/02/2017 1319   Sepsis Labs: @LABRCNTIP (procalcitonin:4,lacticidven:4) )No results found for this or any previous visit (from the past 240 hour(s)).   Radiological Exams on Admission: Ct Head Wo Contrast  Result Date: 05/02/2017 CLINICAL DATA:  Altered mental status EXAM: CT HEAD WITHOUT  CONTRAST TECHNIQUE: Contiguous axial images were obtained from the base of the skull through the vertex without intravenous contrast. COMPARISON:  Head CT 07/04/2012 FINDINGS: Brain: No mass lesion, intraparenchymal hemorrhage or extra-axial collection. No evidence of acute cortical infarct. Unchanged communicating hydrocephalus. There is periventricular hypoattenuation compatible with chronic microvascular disease. Vascular: No hyperdense vessel or unexpected calcification. Skull: Normal visualized skull base, calvarium and extracranial soft tissues. Sinuses/Orbits: Unchanged diffuse mild mucosal thickening of the paranasal sinuses. No fluid levels. Mastoid air cells are clear. Normal orbits. IMPRESSION: Unchanged chronic communicating hydrocephalus without acute intracranial abnormality. Electronically Signed   By: Ulyses Jarred M.D.   On: 05/02/2017 14:40   Dg  Chest Portable 1 View  Result Date: 05/02/2017 CLINICAL DATA:  Cough, fever and urinary tract infection. EXAM: PORTABLE CHEST 1 VIEW COMPARISON:  02/04/2017.  03/28/2016. chest CT 09/01/2013. FINDINGS: Heart size is normal. There is aortic atherosclerosis. Left lung remains clear. The right lung shows some areas of alveolar filling at the lung base which appear more prominent than on previous studies. This could be chronic scarring or could possibly indicate tree in bud opacities as might be seen with atypical infection. There is no consolidation or lobar collapse. No evidence of heart failure or effusion. IMPRESSION: Increased areas of focal airspace density at the right lung base compared to previous studies. This could be progressive scarring or could represent an active atypical infection. No consolidation or lobar collapse. Electronically Signed   By: Nelson Chimes M.D.   On: 05/02/2017 17:19    EKG: Independently reviewed.  Sinus rhythm, rate 78, PVC  Assessment/Plan   Sepsis secondary to urinary tract infection and possible  pneumonia -Patient presented with fever, tachypnea, elevated lactic acid -Chest x-ray showed decreased areas of focal airspace density in the right lung base -UA was cloudy in appearance, many bacteria, TNTC WBC, large leukocytes -Urine and blood cultures pending -Continue azithromycin and ceftriaxone -Will place on gentle IV fluids -Will order strep pneumonia and Legionella urine antigens  -Patient has also had some cough as well as congestion, will test for influenza and obtain respiratory viral panel  Chronic systolic and diastolic heart failure -Echocardiogram 09/05/2013 showed an EF of 35-70%, grade 2 diastolic dysfunction  -will monitor intake and output, daily weights -Will hold Lasix at this time given sepsis -Continue digoxin, metoprolol  COPD -No wheezing noted, will continue home medications of albuterol as needed along with Breo  Dementia  -Per wife, patient has been mildly confused however has had dementia for quite some time. -will continue to monitor closely, suspect will worsen given sepsis and Infection  Essential hypertension -Continue metoprolol, will hold Lasix -Placed on hydralazine as needed  Diabetes mellitus, type II -hold Januvia -place on insulin sliding scale and CBG monitoring  Hyperlipidemia -Continue statin  PVD -Continue Plavix,  statin  DVT prophylaxis: lovenox  Code Status: Full (discussed with wife at bedside, she is leaning more towards DNR but would like to discuss with patient's family)  Family Communication: Wife at bedside.  Admission, patients condition and plan of care including tests being ordered have been discussed with the patient's wife, who indicates understanding and agrees with the plan and Code Status.  Disposition Plan: Admitted, likely home discharge  Consults called: None  Admission status: Inpatient.  Feel that patient will need IV antibiotics for at least 24-48 hours and given his sepsis and fragility.  Time spent:  70 minutes  Broox Lonigro D.O. [facility] Pager [phone]  If 7PM-7AM, please contact night-coverage [email or web address] Password [facility] 05/02/2017, 6:23 PM

## 2017-05-02 NOTE — ED Notes (Signed)
I stat lactic acid results given to Dr. Rex Kras by B. Yolanda Bonine, EMT

## 2017-05-02 NOTE — ED Notes (Signed)
Patient transported to CT 

## 2017-05-02 NOTE — ED Provider Notes (Signed)
3:59 PM Handoff from [facility] PA-C at shift change.  Patient with history of dementia, CHF, lives at home with his wife who cares for him.  At baseline he is confused but conversant, ambulatory with a walker.  Patient has had increasing urinary frequency over the past week.  This morning patient was more tired than normal, unable to get up on his feet due to generalized weakness, and was less conversant.  EMS was called for transport and it took several people to get him onto the stretcher.   I spoke with the patient and the wife.  Patient has a low-grade fever checked rectally.  Urine is obviously infected.  Recent urine cultures grew out multiple species.  Will start on Rocephin.  Awaiting remainder of lab work.  CT head is negative for acute findings.  Will admit when workup complete.  BP (!) 187/74 (BP Location: Left Arm)   Pulse 75   Temp (!) 100.8 F (38.2 C) (Rectal)   Resp 18   Ht 6' (1.829 m)   Wt 83.9 kg (185 lb)   SpO2 100%   BMI 25.09 kg/m   4:11 PM Lactic acid is slightly elevated. 500cc bolus ordered. Do not want to give 30cc/kg bolus given h/o CHF, MAP>65, lactic acid < 4. Abx ordered. Cultures ordered.   5:43 PM Portable chest shows possible atypical PNA. Azithro added for PNA coverage.   Spoke with Dr. Ree Kida who will see. Flu swab ordered. CBC needed to be redrawn and is pending.   BP (!) 184/62   Pulse 76   Temp (!) 100.8 F (38.2 C) (Rectal)   Resp (!) 26   Ht 6' (1.829 m)   Wt 83.9 kg (185 lb)   SpO2 99%   BMI 25.09 kg/m     Carlisle Cater, PA-C 05/02/17 1744    Fatima Blank, MD 05/04/17 1100

## 2017-05-02 NOTE — ED Provider Notes (Signed)
[facility] EMERGENCY DEPARTMENT Provider Note   CSN: [id number] Arrival date & time: 05/02/17  1223     History   Chief Complaint Chief Complaint  Patient presents with  . Altered Mental Status  . Weakness    HPI [name] is a 83 y.o. male who presents with AMS. PMH significant for dementia (lives at home with wife who is caregiver), CHF EF 20-25%, COPD, anemia, HTN, HLD, PVD, Type 2 DM. Wife is at bedside. She states that this morning she noted he had generalized weakness. He had somewhat advanced dementia but he is able to get up on his own and interacts. Today he had a lot of difficulty getting up and has been increasingly confused. He is not able to talk and hasn't been eating. She got him to sit at the table to eat but he started vomiting. He's been urinating frequently - sometimes is up 6 x a night. She also has noted that his extremities are cool and he's had several surgeries for PAD. He's had URI symptoms recently as well - has had a runny nose, cough, wheezing.   Level 5 caveat due to AMS  HPI  Past Medical History:  Diagnosis Date  . Alzheimer's dementia 2013  . Anemia    HX OF ANEMIA  . CHF (congestive heart failure) ([place])   . Colon cancer ([place])   . COPD (chronic obstructive pulmonary disease) ([place])   . Depression   . Dyspnea   . Hyperlipidemia   . Hypertension   . Pneumonia 2014  . PVD (peripheral vascular disease) ([place])   . Type II diabetes mellitus [facility])     Patient Active Problem List   Diagnosis Date Noted  . Diabetes mellitus ([place]) 12/25/2013  . Type I (juvenile type) diabetes mellitus with peripheral circulatory disorders, not stated as uncontrolled(250.71) 12/18/2013  . Acute DVT (deep venous thrombosis) ([place]) 12/12/2013  . PNA (pneumonia) 12/11/2013  . Candida esophagitis ([place]) 09/06/2013  . Chronic systolic CHF (congestive heart failure) ([place]) 09/06/2013  . CAP (community acquired pneumonia) 09/05/2013  . COPD  exacerbation ([place]) 09/04/2013  . Microcytic anemia 09/03/2013  . Chronic respiratory failure with hypoxia ([place]) 09/01/2013  . Symptomatic anemia 09/01/2013  . DOE (dyspnea on exertion) 09/01/2013  . Atherosclerosis of native arteries of the extremities with ulceration ([place]) 06/24/2013  . PAD (peripheral artery disease) ([place]) 04/28/2013  . Chest pain 03/23/2013  . Nausea with vomiting 03/23/2013  . Pain in joint, ankle and foot 03/23/2013  . Dementia 07/04/2012  . URI (upper respiratory infection) 07/04/2012  . PVD (peripheral vascular disease) ([place]) 04/21/2012  . Leg pain 04/18/2012  . Upper airway cough syndrome 06/02/2007  . Hyperlipidemia 05/07/2007  . PVD 05/07/2007  . COPD COPD II  05/07/2007  . DYSPNEA 05/07/2007  . Essential hypertension 05/07/2007    Past Surgical History:  Procedure Laterality Date  . ABDOMINAL ANGIOGRAM N/A 04/20/2013   Procedure: ABDOMINAL ANGIOGRAM;  Surgeon: Angelia Mould, MD;  Location: [facility] CATH LAB;  Service: Cardiovascular;  Laterality: N/A;  . ABDOMINAL AORTOGRAM W/LOWER EXTREMITY N/A 11/20/2016   Procedure: ABDOMINAL AORTOGRAM W/LOWER EXTREMITY;  Surgeon: Serafina Mitchell, MD;  Location: [facility] CV LAB;  Service: Cardiovascular;  Laterality: N/A;  . CATARACT EXTRACTION Right 02/2013  . CATARACT EXTRACTION W/ INTRAOCULAR LENS IMPLANT Left 01/2013  . COLON RESECTION  1997   /enc. notes 05/17/2004 (04/28/2013)  . COLON SURGERY  1996   cancer  . ESOPHAGOGASTRODUODENOSCOPY N/A 09/05/2013  Procedure: ESOPHAGOGASTRODUODENOSCOPY (EGD);  Surgeon: Lear Ng, MD;  Location: [facility] ENDOSCOPY;  Service: Endoscopy;  Laterality: N/A;  . FEMORAL ARTERY - FEMORAL ARTERY BYPASS GRAFT     right to left/enc. notes 05/17/2004 (04/28/2013)  . FEMORAL BYPASS Right    /enc. notes 05/17/2004 (04/28/2013)  . FEMORAL-POPLITEAL BYPASS GRAFT Right 04/29/2013   Procedure:  FEMORAL-POPLITEAL ARTERY Bypass Graft with intraoperative ultrasound and arteriogarm;  Surgeon:  Mal Misty, MD;  Location: [facility];  Service: Vascular;  Laterality: Right;  . FIBEROPTIC BRONCHOSCOPY     Yvette Rack. notes 03/16/2005 (04/28/2013)  . INTRAOPERATIVE ARTERIOGRAM Right 04/29/2013   Procedure: INTRA OPERATIVE ARTERIOGRAM;  Surgeon: Mal Misty, MD;  Location: [facility];  Service: Vascular;  Laterality: Right;  . LOWER EXTREMITY ANGIOGRAM  04/20/2013   Procedure: LOWER EXTREMITY ANGIOGRAM;  Surgeon: Angelia Mould, MD;  Location: [facility] CATH LAB;  Service: Cardiovascular;;  . LUMBAR Colon SURGERY  2005  . PERCUTANEOUS STENT INTERVENTION  04/20/2013   Procedure: PERCUTANEOUS STENT INTERVENTION;  Surgeon: Angelia Mould, MD;  Location: [facility] CATH LAB;  Service: Cardiovascular;;  right common iliac artery  . PERIPHERAL VASCULAR CATHETERIZATION N/A 01/31/2016   Procedure: Abdominal Aortogram w/Lower Extremity;  Surgeon: Serafina Mitchell, MD;  Location: [facility] CV LAB;  Service: Cardiovascular;  Laterality: N/A;  . PERIPHERAL VASCULAR CATHETERIZATION Right 01/31/2016   Procedure: Peripheral Vascular Atherectomy;  Surgeon: Serafina Mitchell, MD;  Location: [place] CV LAB;  Service: Cardiovascular;  Laterality: Right;  Iliac common and external  . PERIPHERAL VASCULAR CATHETERIZATION Right 01/31/2016   Procedure: Peripheral Vascular Intervention;  Surgeon: Serafina Mitchell, MD;  Location: [place] CV LAB;  Service: Cardiovascular;  Laterality: Right;  external iliac and common iliac  . PERIPHERAL VASCULAR CATHETERIZATION N/A 04/24/2016   Procedure: Abdominal Aortogram w/Lower Extremity;  Surgeon: Serafina Mitchell, MD;  Location: [facility] CV LAB;  Service: Cardiovascular;  Laterality: N/A;  . PERIPHERAL VASCULAR CATHETERIZATION Right 04/24/2016   Procedure: Peripheral Vascular Intervention;  Surgeon: Serafina Mitchell, MD;  Location: [place] CV LAB;  Service: Cardiovascular;  Laterality: Right;  external illiac Rt  . SPINE SURGERY    . VASCULAR SURGERY         Home Medications     Prior to Admission medications   Medication Sig Start Date End Date Taking? Authorizing Provider  albuterol (PROVENTIL HFA;VENTOLIN HFA) 108 (90 BASE) MCG/ACT inhaler Inhale 2 puffs into the lungs every 6 (six) hours as needed for shortness of breath. 12/22/14   Parrett, Fonnie Mu, NP  albuterol (PROVENTIL) (2.5 MG/3ML) 0.083% nebulizer solution Inhale 3 mLs into the lungs every 6 (six) hours as needed for wheezing or shortness of breath.  10/14/15   [provider]  Cholecalciferol (VITAMIN D) 2000 UNITS CAPS Take 2,000 Units by mouth daily.     [provider]  clopidogrel (PLAVIX) 75 MG tablet Take 1 tablet (75 mg total) by mouth daily. 01/31/16   Serafina Mitchell, MD  digoxin (LANOXIN) 0.125 MG tablet Take 0.125 mg by mouth.     [provider]  famotidine (PEPCID) 20 MG tablet One at bedtime 04/26/17   Tanda Rockers, MD  fluticasone furoate-vilanterol (BREO ELLIPTA) 100-25 MCG/INH AEPB Inhale 1 puff into the lungs daily. 04/17/17   Tanda Rockers, MD  furosemide (LASIX) 20 MG tablet Take 1 tablet (20 mg total) by mouth daily. 10/27/13   Tanna Furry, MD  metoprolol tartrate (LOPRESSOR) 25 MG tablet Take 0.5 tablets (12.5  mg total) by mouth 2 (two) times daily. 09/07/13   Samuella Cota, MD  pantoprazole (PROTONIX) 40 MG tablet Take 1 tablet (40 mg total) by mouth daily. Take 30-60 min before first meal of the day 04/26/17   Tanda Rockers, MD  pravastatin (PRAVACHOL) 40 MG tablet Take 40 mg by mouth daily after supper.     [provider]  sitaGLIPtin (JANUVIA) 100 MG tablet Take 100 mg by mouth daily.    [provider]    Family History Family History  Problem Relation Age of Onset  . Stroke Mother 9  . Hypertension Mother   . Cancer Sister   . Heart disease Sister   . Heart disease Brother        Heart Disease before age 42  . Heart attack Brother   . Cancer Sister   . Cancer Brother     Social History Social History   Tobacco Use   . Smoking status: Former Smoker    Packs/day: 0.30    Years: 40.00    Pack years: 12.00    Types: Cigarettes    Last attempt to quit: 03/26/1993    Years since quitting: 24.1  . Smokeless tobacco: Never Used  Substance Use Topics  . Alcohol use: No  . Drug use: No     Allergies   Patient has no known allergies.   Review of Systems Review of Systems  Unable to perform ROS: Dementia     Physical Exam Updated Vital Signs BP (!) 187/74 (BP Location: Left Arm)   Pulse 75   Temp 98.6 F (37 C) (Oral)   Resp 18   Ht 6' (1.829 m)   Wt 83.9 kg (185 lb)   SpO2 100%   BMI 25.09 kg/m   Physical Exam  Constitutional: He appears well-developed and well-nourished. He appears listless. No distress.  Answers to name, but will not answer questions  HENT:  Head: Normocephalic and atraumatic.  Eyes: Conjunctivae are normal. Pupils are equal, round, and reactive to light. Right eye exhibits no discharge. Left eye exhibits no discharge. No scleral icterus.  Neck: Normal range of motion.  Cardiovascular: Normal rate and regular rhythm.  Pulmonary/Chest: Effort normal and breath sounds normal. No respiratory distress.  Abdominal: Soft. He exhibits no distension. There is no tenderness.  Musculoskeletal:  1+ DP pulse bilaterally  Neurological: He appears listless. He is disoriented. GCS eye subscore is 4. GCS verbal subscore is 4. GCS motor subscore is 5.  Skin: Skin is warm and dry.  Psychiatric: He has a normal mood and affect. His behavior is normal.  Nursing note and vitals reviewed.    ED Treatments / Results  Labs (all labs ordered are listed, but only abnormal results are displayed) Labs Reviewed - No data to display  EKG  EKG Interpretation None       Radiology No results found.  Procedures Procedures (including critical care time)  Medications Ordered in ED Medications - No data to display   Initial Impression / Assessment and Plan / ED Course  I have  reviewed the triage vital signs and the nursing notes.  Pertinent labs & imaging results that were available during my care of the patient were reviewed by me and considered in my medical decision making (see chart for details).  83 year old male presents with acute altered mental status from baseline. Unclear etiology. He is hypertensive but vitals are normal. Rectal temp was ordered. On exam he is  disoriented but will respond to voice. Heart is regular rate and rhythm. He has mild wheezes on exam. Abdomen is non-tender. Work up initiated with CBC, CMP, UA, CXR, EKG, CT head. Studies are pending at shift change. Care was transferred to Alvera Singh who will dispo.  Final Clinical Impressions(s) / ED Diagnoses   Final diagnoses:  Acute encephalopathy    ED Discharge Orders    None           Recardo Evangelist, PA-C 05/02/17 1626    Little, Wenda Overland, MD 05/03/17 [phone]

## 2017-05-02 NOTE — ED Notes (Addendum)
Pt is a hard stick and has been stuck many times today.  Patient's spouse is wanting to give him a break for a bit instead of drawing any blood at this time. Labs can be drawn when he gets a room upstairs.

## 2017-05-02 NOTE — ED Triage Notes (Signed)
Per EMS, patient coming from home with increase in confusion and weakness.  Usually able to walk on own but can not walk today without assistance.  Hx of dementia.  Family stated that he has not been willing to eat today.  Unable to answer any questions.  No known hx of DM.  112/54, CBG 216, RR 16, 70s HR.

## 2017-05-03 ENCOUNTER — Other Ambulatory Visit: Payer: Self-pay

## 2017-05-03 DIAGNOSIS — J101 Influenza due to other identified influenza virus with other respiratory manifestations: Secondary | ICD-10-CM

## 2017-05-03 DIAGNOSIS — G9340 Encephalopathy, unspecified: Secondary | ICD-10-CM

## 2017-05-03 LAB — RESPIRATORY PANEL BY PCR
Adenovirus: NOT DETECTED
BORDETELLA PERTUSSIS-RVPCR: NOT DETECTED
CORONAVIRUS OC43-RVPPCR: NOT DETECTED
Chlamydophila pneumoniae: NOT DETECTED
Coronavirus 229E: NOT DETECTED
Coronavirus HKU1: NOT DETECTED
Coronavirus NL63: NOT DETECTED
INFLUENZA A H1-RVPPCR: NOT DETECTED
INFLUENZA A-RVPPCR: NOT DETECTED
Influenza A H1 2009: NOT DETECTED
Influenza A H3: DETECTED — AB
Influenza B: NOT DETECTED
METAPNEUMOVIRUS-RVPPCR: NOT DETECTED
Mycoplasma pneumoniae: NOT DETECTED
PARAINFLUENZA VIRUS 1-RVPPCR: NOT DETECTED
PARAINFLUENZA VIRUS 2-RVPPCR: NOT DETECTED
PARAINFLUENZA VIRUS 3-RVPPCR: NOT DETECTED
PARAINFLUENZA VIRUS 4-RVPPCR: NOT DETECTED
RESPIRATORY SYNCYTIAL VIRUS-RVPPCR: NOT DETECTED
RHINOVIRUS / ENTEROVIRUS - RVPPCR: NOT DETECTED

## 2017-05-03 LAB — BASIC METABOLIC PANEL
ANION GAP: 11 (ref 5–15)
BUN: 13 mg/dL (ref 6–20)
CHLORIDE: 103 mmol/L (ref 101–111)
CO2: 23 mmol/L (ref 22–32)
Calcium: 8.7 mg/dL — ABNORMAL LOW (ref 8.9–10.3)
Creatinine, Ser: 1.1 mg/dL (ref 0.61–1.24)
Glucose, Bld: 119 mg/dL — ABNORMAL HIGH (ref 65–99)
POTASSIUM: 4.1 mmol/L (ref 3.5–5.1)
SODIUM: 137 mmol/L (ref 135–145)

## 2017-05-03 LAB — GLUCOSE, CAPILLARY
GLUCOSE-CAPILLARY: 145 mg/dL — AB (ref 65–99)
GLUCOSE-CAPILLARY: 161 mg/dL — AB (ref 65–99)
GLUCOSE-CAPILLARY: 103 mg/dL — AB (ref 65–99)
GLUCOSE-CAPILLARY: 107 mg/dL — AB (ref 65–99)
Glucose-Capillary: 103 mg/dL — ABNORMAL HIGH (ref 65–99)

## 2017-05-03 LAB — CBC
HCT: 39.1 % (ref 39.0–52.0)
HEMOGLOBIN: 12.6 g/dL — AB (ref 13.0–17.0)
MCH: 28.4 pg (ref 26.0–34.0)
MCHC: 32.2 g/dL (ref 30.0–36.0)
MCV: 88.3 fL (ref 78.0–100.0)
PLATELETS: 242 10*3/uL (ref 150–400)
RBC: 4.43 MIL/uL (ref 4.22–5.81)
RDW: 15.1 % (ref 11.5–15.5)
WBC: 4.8 10*3/uL (ref 4.0–10.5)

## 2017-05-03 LAB — URINE CULTURE: CULTURE: NO GROWTH

## 2017-05-03 LAB — LACTIC ACID, PLASMA: Lactic Acid, Venous: 1.2 mmol/L (ref 0.5–1.9)

## 2017-05-03 LAB — CBG MONITORING, ED: Glucose-Capillary: 113 mg/dL — ABNORMAL HIGH (ref 65–99)

## 2017-05-03 MED ORDER — GUAIFENESIN 100 MG/5ML PO SOLN
5.0000 mL | ORAL | Status: DC | PRN
  Administered 2017-05-03: 100 mg via ORAL
  Filled 2017-05-03: qty 5

## 2017-05-03 MED ORDER — OSELTAMIVIR PHOSPHATE 30 MG PO CAPS
30.0000 mg | ORAL_CAPSULE | Freq: Two times a day (BID) | ORAL | Status: DC
  Administered 2017-05-03 – 2017-05-05 (×5): 30 mg via ORAL
  Filled 2017-05-03 (×5): qty 1

## 2017-05-03 NOTE — Progress Notes (Addendum)
PROGRESS NOTE    [name]  CVE:[id number] DOB: 20-Jan-1935 DOA: 05/02/2017 PCP: Glendale Chard, MD   Chief Complaint  Patient presents with  . Altered Mental Status  . Weakness    Brief Narrative:  HPI on 05/02/2017  [name] is a 83 y.o. male with a medical history of CHF with an EF of 20-25%, dementia, COPD, depression, presented to the emergency department with complaints of confusion.  Per wife, patient has been a little more confused recently.  He has also been having more urinary frequency.  Patient has also developed cough as well as runny nose.  He has had no complaints of chest pain, abdominal pain, changes in bowel pattern.  Per wife, patient is normally constipated.  He has been having cough without sputum production.  Currently patient does have dementia and is unable to answer most questions.  Interim history Admitted for sepsis secondary to urinary tract infection and possible pneumonia.  Patient did test positive for the flu.  Started on Tamiflu. Assessment & Plan   Sepsis secondary to urinary tract infection and possible pneumonia/influenza -Patient presented with fever, tachypnea, elevated lactic acid -Chest x-ray showed decreased areas of focal airspace density in the right lung base -UA was cloudy in appearance, many bacteria, TNTC WBC, large leukocytes -Blood and urine cultures still pending -Continue azithromycin and ceftriaxone -Influenza PCR positive for influenza A-started patient on Tamiflu -Strep pneumonia urine antigen negative -Will likely repeat chest x-ray in the morning, will continue gentle IV fluids  Chronic systolic and diastolic heart failure -Echocardiogram 09/05/2013 showed an EF of 02-58%, grade 2 diastolic dysfunction  -will monitor intake and output, daily weights -Continue to hold Lasix at this time given sepsis -Continue digoxin, metoprolol  COPD -No wheezing noted, will continue home medications of albuterol as needed along with  Breo  Dementia  -Per wife, patient has been mildly confused however has had dementia for quite some time. -Appears to be stable  Essential hypertension -Continue metoprolol, will hold Lasix -Continue IV hydralazine as needed  Diabetes mellitus, type II -hold Januvia -Continue insulin sliding scale and CBG monitoring  Hyperlipidemia -Continue statin  PVD -Continue Plavix,  statin  DVT Prophylaxis  lovenox  Code Status: Full  Family Communication: None at bedside (received a call from the RN that patient's daughter wanted to speak with me- I called into the room and discussed the patient with the daughter, she states that I did not see her father this morning and wanted an exact time that I saw her father. I explained to her that I do not remember the exact time I saw her father, but I know that someone had been in there with him overnight as I saw blankets on the chair. She stated "I do not believe you, anyone could have told you that."   Disposition Plan: Admitted. Possible discharge to home in 48-72 hours.   Consultants None  Procedures  None  Antibiotics   Anti-infectives (From admission, onward)   Start     Dose/Rate Route Frequency Ordered Stop   05/03/17 1730  azithromycin (ZITHROMAX) 500 mg in dextrose 5 % 250 mL IVPB     500 mg 250 mL/hr over 60 Minutes Intravenous Every 24 hours 05/02/17 1819     05/03/17 1630  cefTRIAXone (ROCEPHIN) 1 g in dextrose 5 % 50 mL IVPB     1 g 100 mL/hr over 30 Minutes Intravenous Every 24 hours 05/02/17 1819 05/10/17 1629   05/03/17 1000  oseltamivir (  TAMIFLU) capsule 30 mg     30 mg Oral 2 times daily 05/03/17 0852 05/08/17 0959   05/02/17 2000  cefTRIAXone (ROCEPHIN) 1 g in dextrose 5 % 50 mL IVPB  Status:  Discontinued     1 g 100 mL/hr over 30 Minutes Intravenous  Once 05/02/17 1953 05/03/17 0455   05/02/17 2000  azithromycin (ZITHROMAX) 500 mg in dextrose 5 % 250 mL IVPB  Status:  Discontinued     500 mg 250 mL/hr over  60 Minutes Intravenous  Once 05/02/17 1953 05/03/17 0455   05/02/17 1745  azithromycin (ZITHROMAX) 500 mg in dextrose 5 % 250 mL IVPB     500 mg 250 mL/hr over 60 Minutes Intravenous  Once 05/02/17 1734 05/02/17 1915   05/02/17 1600  cefTRIAXone (ROCEPHIN) 1 g in dextrose 5 % 50 mL IVPB     1 g 100 mL/hr over 30 Minutes Intravenous  Once 05/02/17 1556 05/02/17 1739      Subjective:   [name] seen and examined today.  Patient with dementia.  Has no complaints this morning.  Does not know where he is.  Only alert to self. Objective:   Vitals:   05/03/17 0444 05/03/17 0937 05/03/17 1000 05/03/17 1117  BP: (!) 158/58  (!) 158/63   Pulse: 64  66 67  Resp: 19  18   Temp: 98.7 F (37.1 C)  98.5 F (36.9 C)   TempSrc: Oral  Oral   SpO2: 98% 97% 98%   Weight: 83.7 kg (184 lb 8.4 oz)     Height: 6' (1.829 m)       Intake/Output Summary (Last 24 hours) at 05/03/2017 1246 Last data filed at 05/03/2017 1115 Gross per 24 hour  Intake 2442.5 ml  Output 0 ml  Net 2442.5 ml   Filed Weights   05/02/17 1229 05/03/17 0444  Weight: 83.9 kg (185 lb) 83.7 kg (184 lb 8.4 oz)    Exam  General: Well developed, well nourished, NAD, appears stated age  HEENT: NCAT, mucous membranes moist.   Cardiovascular: Diminished breath sounds, nonproductive cough  Respiratory: Clear to auscultation bilaterally with equal chest rise  Abdomen: Soft, nontender, nondistended, + bowel sounds  Extremities: warm dry without cyanosis clubbing.  Lower extremity edema  Neuro: AAOx1, nonfocal  Data Reviewed: I have personally reviewed following labs and imaging studies  CBC: Recent Labs  Lab 05/02/17 1837 05/02/17 2032 05/03/17 0806  WBC 7.6 6.9 4.8  HGB 11.5* 11.8* 12.6*  HCT 36.5* 36.5* 39.1  MCV 88.0 88.0 88.3  PLT 255 270 062   Basic Metabolic Panel: Recent Labs  Lab 05/02/17 1319 05/02/17 2032 05/03/17 0806  NA 139 138 137  K 4.9 4.2 4.1  CL 102 104 103  CO2 24 23 23   GLUCOSE 156*  157* 119*  BUN 16 14 13   CREATININE 1.29* 1.22 1.10  CALCIUM 9.5 8.5* 8.7*   GFR: Estimated Creatinine Clearance: 58.8 mL/min (by C-G formula based on SCr of 1.1 mg/dL). Liver Function Tests: Recent Labs  Lab 05/02/17 1319 05/02/17 2032  AST 27 19  ALT 11* 10*  ALKPHOS 76 71  BILITOT 1.0 0.7  PROT 7.6 6.4*  ALBUMIN 3.7 3.1*   No results for input(s): LIPASE, AMYLASE in the last 168 hours. No results for input(s): AMMONIA in the last 168 hours. Coagulation Profile: Recent Labs  Lab 05/02/17 2032  INR 1.05   Cardiac Enzymes: No results for input(s): CKTOTAL, CKMB, CKMBINDEX, TROPONINI in the last 168 hours.  BNP (last 3 results) No results for input(s): PROBNP in the last 8760 hours. HbA1C: No results for input(s): HGBA1C in the last 72 hours. CBG: Recent Labs  Lab 05/02/17 1542 05/03/17 0056 05/03/17 0429 05/03/17 0934 05/03/17 1214  GLUCAP 123* 113* 145* 161* 103*   Lipid Profile: No results for input(s): CHOL, HDL, LDLCALC, TRIG, CHOLHDL, LDLDIRECT in the last 72 hours. Thyroid Function Tests: No results for input(s): TSH, T4TOTAL, FREET4, T3FREE, THYROIDAB in the last 72 hours. Anemia Panel: No results for input(s): VITAMINB12, FOLATE, FERRITIN, TIBC, IRON, RETICCTPCT in the last 72 hours. Urine analysis:    Component Value Date/Time   COLORURINE YELLOW 05/02/2017 1319   APPEARANCEUR CLOUDY (A) 05/02/2017 1319   LABSPEC 1.012 05/02/2017 1319   PHURINE 5.0 05/02/2017 1319   GLUCOSEU NEGATIVE 05/02/2017 1319   HGBUR MODERATE (A) 05/02/2017 1319   BILIRUBINUR NEGATIVE 05/02/2017 1319   KETONESUR NEGATIVE 05/02/2017 1319   PROTEINUR 30 (A) 05/02/2017 1319   UROBILINOGEN 1.0 12/10/2013 1456   NITRITE NEGATIVE 05/02/2017 1319   LEUKOCYTESUR LARGE (A) 05/02/2017 1319   Sepsis Labs: @LABRCNTIP (procalcitonin:4,lacticidven:4)  ) Recent Results (from the past 240 hour(s))  Respiratory Panel by PCR     Status: Abnormal   Collection Time: 05/02/17  5:49 PM    Result Value Ref Range Status   Adenovirus NOT DETECTED NOT DETECTED Final   Coronavirus 229E NOT DETECTED NOT DETECTED Final   Coronavirus HKU1 NOT DETECTED NOT DETECTED Final   Coronavirus NL63 NOT DETECTED NOT DETECTED Final   Coronavirus OC43 NOT DETECTED NOT DETECTED Final   Metapneumovirus NOT DETECTED NOT DETECTED Final   Rhinovirus / Enterovirus NOT DETECTED NOT DETECTED Final   Influenza A NOT DETECTED NOT DETECTED Final   Influenza A H1 NOT DETECTED NOT DETECTED Final   Influenza A H1 2009 NOT DETECTED NOT DETECTED Final   Influenza A H3 DETECTED (A) NOT DETECTED Final   Influenza B NOT DETECTED NOT DETECTED Final   Parainfluenza Virus 1 NOT DETECTED NOT DETECTED Final   Parainfluenza Virus 2 NOT DETECTED NOT DETECTED Final   Parainfluenza Virus 3 NOT DETECTED NOT DETECTED Final   Parainfluenza Virus 4 NOT DETECTED NOT DETECTED Final   Respiratory Syncytial Virus NOT DETECTED NOT DETECTED Final   Bordetella pertussis NOT DETECTED NOT DETECTED Final   Chlamydophila pneumoniae NOT DETECTED NOT DETECTED Final   Mycoplasma pneumoniae NOT DETECTED NOT DETECTED Final      Radiology Studies: Ct Head Wo Contrast  Result Date: 05/02/2017 CLINICAL DATA:  Altered mental status EXAM: CT HEAD WITHOUT CONTRAST TECHNIQUE: Contiguous axial images were obtained from the base of the skull through the vertex without intravenous contrast. COMPARISON:  Head CT 07/04/2012 FINDINGS: Brain: No mass lesion, intraparenchymal hemorrhage or extra-axial collection. No evidence of acute cortical infarct. Unchanged communicating hydrocephalus. There is periventricular hypoattenuation compatible with chronic microvascular disease. Vascular: No hyperdense vessel or unexpected calcification. Skull: Normal visualized skull base, calvarium and extracranial soft tissues. Sinuses/Orbits: Unchanged diffuse mild mucosal thickening of the paranasal sinuses. No fluid levels. Mastoid air cells are clear. Normal orbits.  IMPRESSION: Unchanged chronic communicating hydrocephalus without acute intracranial abnormality. Electronically Signed   By: Ulyses Jarred M.D.   On: 05/02/2017 14:40   Dg Chest Portable 1 View  Result Date: 05/02/2017 CLINICAL DATA:  Cough, fever and urinary tract infection. EXAM: PORTABLE CHEST 1 VIEW COMPARISON:  02/04/2017.  03/28/2016. chest CT 09/01/2013. FINDINGS: Heart size is normal. There is aortic atherosclerosis. Left lung remains clear. The right lung  shows some areas of alveolar filling at the lung base which appear more prominent than on previous studies. This could be chronic scarring or could possibly indicate tree in bud opacities as might be seen with atypical infection. There is no consolidation or lobar collapse. No evidence of heart failure or effusion. IMPRESSION: Increased areas of focal airspace density at the right lung base compared to previous studies. This could be progressive scarring or could represent an active atypical infection. No consolidation or lobar collapse. Electronically Signed   By: Nelson Chimes M.D.   On: 05/02/2017 17:19     Scheduled Meds: . cholecalciferol  2,000 Units Oral Daily  . clopidogrel  75 mg Oral Daily  . digoxin  0.125 mg Oral Daily  . enoxaparin (LOVENOX) injection  40 mg Subcutaneous Q24H  . famotidine  20 mg Oral QHS  . fluticasone furoate-vilanterol  1 puff Inhalation Daily  . insulin aspart  0-9 Units Subcutaneous TID WC  . metoprolol tartrate  12.5 mg Oral BID  . oseltamivir  30 mg Oral BID  . pantoprazole  40 mg Oral Daily  . pravastatin  40 mg Oral QPC supper   Continuous Infusions: . sodium chloride 50 mL/hr at 05/02/17 2200  . azithromycin    . cefTRIAXone (ROCEPHIN)  IV       LOS: 1 day   Time Spent in minutes   30 minutes  Kele Withem D.O. on 05/03/2017 at 12:46 PM  Between 7am to 7pm - Pager - [phone]  After 7pm go to [email or web address] - password [email or web address]  And look for the night coverage person covering for me  after hours  [facility] Office  [phone]

## 2017-05-03 NOTE — Progress Notes (Signed)
Patients daughter is at bedside requesting to speak with MD. Ree Kida, MD notified.

## 2017-05-04 ENCOUNTER — Inpatient Hospital Stay (HOSPITAL_COMMUNITY): Payer: PPO

## 2017-05-04 LAB — GLUCOSE, CAPILLARY
GLUCOSE-CAPILLARY: 139 mg/dL — AB (ref 65–99)
GLUCOSE-CAPILLARY: 140 mg/dL — AB (ref 65–99)
GLUCOSE-CAPILLARY: 96 mg/dL (ref 65–99)
GLUCOSE-CAPILLARY: 132 mg/dL — AB (ref 65–99)

## 2017-05-04 LAB — LEGIONELLA PNEUMOPHILA SEROGP 1 UR AG: L. PNEUMOPHILA SEROGP 1 UR AG: NEGATIVE

## 2017-05-04 MED ORDER — AZITHROMYCIN 500 MG PO TABS
250.0000 mg | ORAL_TABLET | Freq: Every day | ORAL | Status: DC
  Administered 2017-05-04 – 2017-05-05 (×2): 250 mg via ORAL
  Filled 2017-05-04 (×2): qty 1

## 2017-05-04 MED ORDER — FUROSEMIDE 20 MG PO TABS
20.0000 mg | ORAL_TABLET | Freq: Every day | ORAL | Status: DC
  Administered 2017-05-04 – 2017-05-05 (×2): 20 mg via ORAL
  Filled 2017-05-04 (×2): qty 1

## 2017-05-04 MED ORDER — CEFUROXIME AXETIL 500 MG PO TABS
250.0000 mg | ORAL_TABLET | Freq: Two times a day (BID) | ORAL | Status: DC
  Administered 2017-05-04 – 2017-05-05 (×2): 250 mg via ORAL
  Filled 2017-05-04 (×2): qty 1

## 2017-05-04 NOTE — Progress Notes (Signed)
PROGRESS NOTE    [name]  HMC:[id number] DOB: [birth year] DOA: 05/02/2017 PCP: Glendale Chard, MD   Chief Complaint  Patient presents with  . Altered Mental Status  . Weakness    Brief Narrative:  HPI on 05/02/2017  ANTOINNE [name] is a 83 y.o. male with a medical history of CHF with an EF of 20-25%, dementia, COPD, depression, presented to the emergency department with complaints of confusion.  Per wife, patient has been a little more confused recently.  He has also been having more urinary frequency.  Patient has also developed cough as well as runny nose.  He has had no complaints of chest pain, abdominal pain, changes in bowel pattern.  Per wife, patient is normally constipated.  He has been having cough without sputum production.  Currently patient does have dementia and is unable to answer most questions.  Interim history Admitted for sepsis secondary to urinary tract infection and possible pneumonia.  Patient did test positive for the flu.  Started on Tamiflu. Assessment & Plan   Sepsis secondary to urinary tract infection and possible pneumonia/influenza -Patient presented with fever, tachypnea, elevated lactic acid -Chest x-ray showed decreased areas of focal airspace density in the right lung base -UA was cloudy in appearance, many bacteria, TNTC WBC, large leukocytes -Blood cultures show no growth to date -Urine culture shows no growth -was placed on azithromycin and ceftriaxone- will transition to ceftin and azithromycin -Influenza PCR positive for influenza A-started patient on Tamiflu -Strep pneumonia urine antigen negative -Repeat chest x-ray shows stable right basilar pneumonia  Chronic systolic and diastolic heart failure -Echocardiogram 09/05/2013 showed an EF of 66-29%, grade 2 diastolic dysfunction  -will monitor intake and output, daily weights -Will restart Lasix today -Continue digoxin, metoprolol  COPD -No wheezing noted, will continue home medications  of albuterol as needed along with Breo  Dementia  -Per wife, patient has been mildly confused however has had dementia for quite some time. -Appears to be stable  Essential hypertension -Continue metoprolol -Lasix initially held due to sepsis, will restart today -Continue IV hydralazine as needed  Diabetes mellitus, type II -hold Januvia -Continue insulin sliding scale and CBG monitoring  Hyperlipidemia -Continue statin  PVD -Continue Plavix,  statin  DVT Prophylaxis  lovenox  Code Status: Full  Family Communication: Daughter at bedside   Disposition Plan: Admitted. Possible discharge to home in 24 hours  Consultants None  Procedures  None  Antibiotics   Anti-infectives (From admission, onward)   Start     Dose/Rate Route Frequency Ordered Stop   05/03/17 1730  azithromycin (ZITHROMAX) 500 mg in dextrose 5 % 250 mL IVPB     500 mg 250 mL/hr over 60 Minutes Intravenous Every 24 hours 05/02/17 1819     05/03/17 1630  cefTRIAXone (ROCEPHIN) 1 g in dextrose 5 % 50 mL IVPB     1 g 100 mL/hr over 30 Minutes Intravenous Every 24 hours 05/02/17 1819 05/10/17 1629   05/03/17 1000  oseltamivir (TAMIFLU) capsule 30 mg     30 mg Oral 2 times daily 05/03/17 0852 05/08/17 0959   05/02/17 2000  cefTRIAXone (ROCEPHIN) 1 g in dextrose 5 % 50 mL IVPB  Status:  Discontinued     1 g 100 mL/hr over 30 Minutes Intravenous  Once 05/02/17 1953 05/03/17 0455   05/02/17 2000  azithromycin (ZITHROMAX) 500 mg in dextrose 5 % 250 mL IVPB  Status:  Discontinued     500 mg 250 mL/hr over 60  Minutes Intravenous  Once 05/02/17 1953 05/03/17 0455   05/02/17 1745  azithromycin (ZITHROMAX) 500 mg in dextrose 5 % 250 mL IVPB     500 mg 250 mL/hr over 60 Minutes Intravenous  Once 05/02/17 1734 05/02/17 1915   05/02/17 1600  cefTRIAXone (ROCEPHIN) 1 g in dextrose 5 % 50 mL IVPB     1 g 100 mL/hr over 30 Minutes Intravenous  Once 05/02/17 1556 05/02/17 1739      Subjective:   [name]  seen and examined today.  Patient with dementia.  Has no complaints this morning.  Does not know where he is.  Only alert to self and daughter.   Objective:   Vitals:   05/03/17 2132 05/04/17 0643 05/04/17 0858 05/04/17 1000  BP: (!) 156/57 (!) 174/86  (!) 155/80  Pulse: 72 72 72 82  Resp: 18 18 18 18   Temp: 98.4 F (36.9 C) 98 F (36.7 C)  98 F (36.7 C)  TempSrc: Oral Oral  Oral  SpO2: 100% 97% 98% 98%  Weight:      Height:        Intake/Output Summary (Last 24 hours) at 05/04/2017 1124 Last data filed at 05/04/2017 0900 Gross per 24 hour  Intake 1597.51 ml  Output 0 ml  Net 1597.51 ml   Filed Weights   05/02/17 1229 05/03/17 0444  Weight: 83.9 kg (185 lb) 83.7 kg (184 lb 8.4 oz)   Exam  General: Well developed, elderly, no apparent distress  HEENT: NCAT, mucous membranes moist.   Cardiovascular: S1 S2 auscultated, RRR, no murmur  Respiratory: diminished with nonproductive cough  Abdomen: Soft, nontender, nondistended, + bowel sounds  Extremities: warm dry without cyanosis clubbing  Neuro: AAOx1 (self), nonfocal  Psych: Pleasant  Data Reviewed: I have personally reviewed following labs and imaging studies  CBC: Recent Labs  Lab 05/02/17 1837 05/02/17 2032 05/03/17 0806  WBC 7.6 6.9 4.8  HGB 11.5* 11.8* 12.6*  HCT 36.5* 36.5* 39.1  MCV 88.0 88.0 88.3  PLT 255 270 409   Basic Metabolic Panel: Recent Labs  Lab 05/02/17 1319 05/02/17 2032 05/03/17 0806  NA 139 138 137  K 4.9 4.2 4.1  CL 102 104 103  CO2 24 23 23   GLUCOSE 156* 157* 119*  BUN 16 14 13   CREATININE 1.29* 1.22 1.10  CALCIUM 9.5 8.5* 8.7*   GFR: Estimated Creatinine Clearance: 58.8 mL/min (by C-G formula based on SCr of 1.1 mg/dL). Liver Function Tests: Recent Labs  Lab 05/02/17 1319 05/02/17 2032  AST 27 19  ALT 11* 10*  ALKPHOS 76 71  BILITOT 1.0 0.7  PROT 7.6 6.4*  ALBUMIN 3.7 3.1*   No results for input(s): LIPASE, AMYLASE in the last 168 hours. No results for  input(s): AMMONIA in the last 168 hours. Coagulation Profile: Recent Labs  Lab 05/02/17 2032  INR 1.05   Cardiac Enzymes: No results for input(s): CKTOTAL, CKMB, CKMBINDEX, TROPONINI in the last 168 hours. BNP (last 3 results) No results for input(s): PROBNP in the last 8760 hours. HbA1C: No results for input(s): HGBA1C in the last 72 hours. CBG: Recent Labs  Lab 05/03/17 0934 05/03/17 1214 05/03/17 1710 05/03/17 2133 05/04/17 0810  GLUCAP 161* 103* 107* 103* 139*   Lipid Profile: No results for input(s): CHOL, HDL, LDLCALC, TRIG, CHOLHDL, LDLDIRECT in the last 72 hours. Thyroid Function Tests: No results for input(s): TSH, T4TOTAL, FREET4, T3FREE, THYROIDAB in the last 72 hours. Anemia Panel: No results for input(s): VITAMINB12, FOLATE,  FERRITIN, TIBC, IRON, RETICCTPCT in the last 72 hours. Urine analysis:    Component Value Date/Time   COLORURINE YELLOW 05/02/2017 1319   APPEARANCEUR CLOUDY (A) 05/02/2017 1319   LABSPEC 1.012 05/02/2017 1319   PHURINE 5.0 05/02/2017 1319   GLUCOSEU NEGATIVE 05/02/2017 1319   HGBUR MODERATE (A) 05/02/2017 1319   BILIRUBINUR NEGATIVE 05/02/2017 1319   KETONESUR NEGATIVE 05/02/2017 1319   PROTEINUR 30 (A) 05/02/2017 1319   UROBILINOGEN 1.0 12/10/2013 1456   NITRITE NEGATIVE 05/02/2017 1319   LEUKOCYTESUR LARGE (A) 05/02/2017 1319   Sepsis Labs: @LABRCNTIP (procalcitonin:4,lacticidven:4)  ) Recent Results (from the past 240 hour(s))  Urine Culture     Status: None   Collection Time: 05/02/17  3:10 PM  Result Value Ref Range Status   Specimen Description URINE, CATHETERIZED  Final   Special Requests NONE  Final   Culture   Final    NO GROWTH Performed at [facility], [place] [address], [place], [place] [address]    Report Status 05/03/2017 FINAL  Final  Culture, blood (Routine X 2) w Reflex to ID Panel     Status: None (Preliminary result)   Collection Time: 05/02/17  4:28 PM  Result Value Ref Range Status   Specimen  Description BLOOD RIGHT FOREARM  Final   Special Requests IN PEDIATRIC BOTTLE Blood Culture adequate volume  Final   Culture   Final    NO GROWTH 2 DAYS Performed at [facility], [place] [address], [place], [place] [address]    Report Status PENDING  Incomplete  Culture, blood (Routine X 2) w Reflex to ID Panel     Status: None (Preliminary result)   Collection Time: 05/02/17  4:33 PM  Result Value Ref Range Status   Specimen Description BLOOD LEFT ANTECUBITAL  Final   Special Requests IN PEDIATRIC BOTTLE Blood Culture adequate volume  Final   Culture   Final    NO GROWTH 2 DAYS Performed at [facility], [place] [address] [place], [place] [address]    Report Status PENDING  Incomplete  Respiratory Panel by PCR     Status: Abnormal   Collection Time: 05/02/17  5:49 PM  Result Value Ref Range Status   Adenovirus NOT DETECTED NOT DETECTED Final   Coronavirus 229E NOT DETECTED NOT DETECTED Final   Coronavirus HKU1 NOT DETECTED NOT DETECTED Final   Coronavirus NL63 NOT DETECTED NOT DETECTED Final   Coronavirus OC43 NOT DETECTED NOT DETECTED Final   Metapneumovirus NOT DETECTED NOT DETECTED Final   Rhinovirus / Enterovirus NOT DETECTED NOT DETECTED Final   Influenza A NOT DETECTED NOT DETECTED Final   Influenza A H1 NOT DETECTED NOT DETECTED Final   Influenza A H1 2009 NOT DETECTED NOT DETECTED Final   Influenza A H3 DETECTED (A) NOT DETECTED Final   Influenza B NOT DETECTED NOT DETECTED Final   Parainfluenza Virus 1 NOT DETECTED NOT DETECTED Final   Parainfluenza Virus 2 NOT DETECTED NOT DETECTED Final   Parainfluenza Virus 3 NOT DETECTED NOT DETECTED Final   Parainfluenza Virus 4 NOT DETECTED NOT DETECTED Final   Respiratory Syncytial Virus NOT DETECTED NOT DETECTED Final   Bordetella pertussis NOT DETECTED NOT DETECTED Final   Chlamydophila pneumoniae NOT DETECTED NOT DETECTED Final   Mycoplasma pneumoniae NOT DETECTED NOT DETECTED Final      Radiology Studies: Dg  Chest 2 View  Result Date: 05/04/2017 CLINICAL DATA:  Pneumonia EXAM: CHEST  2 VIEW COMPARISON:  05/02/2017 FINDINGS: Right  lower lobe airspace opacity again noted compatible with pneumonia. No real change. Heart is normal size. Left lung is clear. No effusions or acute bony abnormality. IMPRESSION: Stable right basilar pneumonia. Electronically Signed   By: Rolm Baptise M.D.   On: 05/04/2017 09:44   Ct Head Wo Contrast  Result Date: 05/02/2017 CLINICAL DATA:  Altered mental status EXAM: CT HEAD WITHOUT CONTRAST TECHNIQUE: Contiguous axial images were obtained from the base of the skull through the vertex without intravenous contrast. COMPARISON:  Head CT 07/04/2012 FINDINGS: Brain: No mass lesion, intraparenchymal hemorrhage or extra-axial collection. No evidence of acute cortical infarct. Unchanged communicating hydrocephalus. There is periventricular hypoattenuation compatible with chronic microvascular disease. Vascular: No hyperdense vessel or unexpected calcification. Skull: Normal visualized skull base, calvarium and extracranial soft tissues. Sinuses/Orbits: Unchanged diffuse mild mucosal thickening of the paranasal sinuses. No fluid levels. Mastoid air cells are clear. Normal orbits. IMPRESSION: Unchanged chronic communicating hydrocephalus without acute intracranial abnormality. Electronically Signed   By: Ulyses Jarred M.D.   On: 05/02/2017 14:40   Dg Chest Portable 1 View  Result Date: 05/02/2017 CLINICAL DATA:  Cough, fever and urinary tract infection. EXAM: PORTABLE CHEST 1 VIEW COMPARISON:  02/04/2017.  03/28/2016. chest CT 09/01/2013. FINDINGS: Heart size is normal. There is aortic atherosclerosis. Left lung remains clear. The right lung shows some areas of alveolar filling at the lung base which appear more prominent than on previous studies. This could be chronic scarring or could possibly indicate tree in bud opacities as might be seen with atypical infection. There is no consolidation or  lobar collapse. No evidence of heart failure or effusion. IMPRESSION: Increased areas of focal airspace density at the right lung base compared to previous studies. This could be progressive scarring or could represent an active atypical infection. No consolidation or lobar collapse. Electronically Signed   By: Nelson Chimes M.D.   On: 05/02/2017 17:19     Scheduled Meds: . cholecalciferol  2,000 Units Oral Daily  . clopidogrel  75 mg Oral Daily  . digoxin  0.125 mg Oral Daily  . enoxaparin (LOVENOX) injection  40 mg Subcutaneous Q24H  . famotidine  20 mg Oral QHS  . fluticasone furoate-vilanterol  1 puff Inhalation Daily  . insulin aspart  0-9 Units Subcutaneous TID WC  . metoprolol tartrate  12.5 mg Oral BID  . oseltamivir  30 mg Oral BID  . pantoprazole  40 mg Oral Daily  . pravastatin  40 mg Oral QPC supper   Continuous Infusions: . sodium chloride 50 mL/hr at 05/04/17 0233  . azithromycin Stopped (05/03/17 2001)  . cefTRIAXone (ROCEPHIN)  IV Stopped (05/03/17 1848)     LOS: 2 days   Time Spent in minutes   30 minutes  Addie Cederberg D.O. on 05/04/2017 at 11:24 AM  Between 7am to 7pm - Pager - [phone]  After 7pm go to [email or web address] - password [email or web address]  And look for the night coverage person covering for me after hours  [facility] Office  [phone]

## 2017-05-04 NOTE — Evaluation (Signed)
Physical Therapy Evaluation Patient Details Name: [name] MRN: [id number] DOB: 1934/11/08 Today's Date: 05/04/2017   History of Present Illness  [name] is a 83 y.o. male with a medical history of CHF with an EF of 20-25%, dementia, COPD, depression, presented to the emergency department with complaints of confusion.  Per wife, patient has been a little more confused recently.  He has also been having more urinary frequency.  Patient has also developed cough as well as runny nose.  Clinical Impression  Pt admitted with above diagnosis. Pt currently with functional limitations due to the deficits listed below (see PT Problem List). Pt incontinent of bowel in bed and agreeable to walking into bathroom with RW. Min A +2 for safety due to unsafe use of RW and balance deficits. Would benefit from HHPT at d/c.  Pt will benefit from skilled PT to increase their independence and safety with mobility to allow discharge to the venue listed below.       Follow Up Recommendations Home health PT;Supervision/Assistance - 24 hour    Equipment Recommendations  None recommended by PT    Recommendations for Other Services       Precautions / Restrictions Precautions Precautions: Fall Restrictions Weight Bearing Restrictions: No      Mobility  Bed Mobility Overal bed mobility: Needs Assistance Bed Mobility: Supine to Sit     Supine to sit: Min assist     General bed mobility comments: needed min A for LE's off bed, partly for initiation of movement. Was able to push trunk up into sitting with supervision  Transfers Overall transfer level: Needs assistance Equipment used: Rolling walker (2 wheeled) Transfers: Sit to/from Stand Sit to Stand: +2 safety/equipment;Min assist         General transfer comment: first time he stood he sat right back down on bed. Has difficulty getting fwd wt shift. Second and third times, fwd wt shift was facilitated and he maintained  standing  Ambulation/Gait Ambulation/Gait assistance: Min assist;+2 safety/equipment Ambulation Distance (Feet): 10 Feet Assistive device: Rolling walker (2 wheeled) Gait Pattern/deviations: Step-through pattern;Trunk flexed Gait velocity: decreased Gait velocity interpretation: <1.8 ft/sec, indicative of risk for recurrent falls General Gait Details: pushes RW way too far ahead, had to manage it for him. Often stops and will not keep going and off balance with turning. Helped to ask him to march to the chair.   Stairs            Wheelchair Mobility    Modified Rankin (Stroke Patients Only)       Balance Overall balance assessment: Needs assistance Sitting-balance support: Bilateral upper extremity supported Sitting balance-Leahy Scale: Fair     Standing balance support: Single extremity supported Standing balance-Leahy Scale: Fair                               Pertinent Vitals/Pain Pain Assessment: No/denies pain    Home Living Family/patient expects to be discharged to:: Private residence Living Arrangements: Spouse/significant other Available Help at Discharge: Family;Available 24 hours/day Type of Home: House Home Access: Stairs to enter Entrance Stairs-Rails: Right Entrance Stairs-Number of Steps: 4 Home Layout: One level Home Equipment: Cane - single point;Walker - 2 wheels;Bedside commode;Shower seat;Wheelchair - manual Additional Comments: pt's wife is primary caregiver. He has [facility] that comes to help him bathe and he goes to respite care 5 days/wk. He ambulates without AD in home and uses RW for community  ambulation.     Prior Function Level of Independence: Needs assistance   Gait / Transfers Assistance Needed: independent  ADL's / Homemaking Assistance Needed: [facility] assists with bathing  Comments: wife has been caring for him for 22 yrs and feels overwhelmed but the cost of memory care is prohibitive. Daughter comes up from [place]  every other weekend to help     Hand Dominance        Extremity/Trunk Assessment   Upper Extremity Assessment Upper Extremity Assessment: Overall WFL for tasks assessed    Lower Extremity Assessment Lower Extremity Assessment: Overall WFL for tasks assessed    Cervical / Trunk Assessment Cervical / Trunk Assessment: Kyphotic  Communication   Communication: No difficulties  Cognition Arousal/Alertness: Awake/alert Behavior During Therapy: Impulsive Overall Cognitive Status: History of cognitive impairments - at baseline                                 General Comments: rummages in home per wife. Will get up and get completely dressed for work sometimes. But when she asks him to get ready to go somewhere, can be resistant      General Comments General comments (skin integrity, edema, etc.): O2 sats 96% on RA HR 81 bpm    Exercises     Assessment/Plan    PT Assessment Patient needs continued PT services  PT Problem List Decreased activity tolerance;Decreased balance;Decreased mobility;Decreased cognition;Decreased knowledge of use of DME;Decreased knowledge of precautions       PT Treatment Interventions DME instruction;Gait training;Stair training;Functional mobility training;Therapeutic activities;Therapeutic exercise;Balance training;Patient/family education    PT Goals (Current goals can be found in the Care Plan section)  Acute Rehab PT Goals Patient Stated Goal: at this point, wife would like to take him home PT Goal Formulation: With patient/family Time For Goal Achievement: 05/18/17 Potential to Achieve Goals: Good    Frequency Min 3X/week   Barriers to discharge        Co-evaluation               AM-PAC PT "6 Clicks" Daily Activity  Outcome Measure Difficulty turning over in bed (including adjusting bedclothes, sheets and blankets)?: Unable Difficulty moving from lying on back to sitting on the side of the bed? :  Unable Difficulty sitting down on and standing up from a chair with arms (e.g., wheelchair, bedside commode, etc,.)?: Unable Help needed moving to and from a bed to chair (including a wheelchair)?: A Little Help needed walking in hospital room?: A Little Help needed climbing 3-5 steps with a railing? : A Lot 6 Click Score: 11    End of Session Equipment Utilized During Treatment: Gait belt Activity Tolerance: Patient tolerated treatment well Patient left: in chair;with call bell/phone within reach;with family/visitor present Nurse Communication: Mobility status PT Visit Diagnosis: Unsteadiness on feet (R26.81);Other abnormalities of gait and mobility (R26.89)    Time: 1416-1450 PT Time Calculation (min) (ACUTE ONLY): 34 min   Charges:   PT Evaluation $PT Eval Moderate Complexity: 1 Mod PT Treatments $Gait Training: 8-22 mins   PT G Codes:        Leighton Roach, PT  Acute Rehab Services  [place] 05/04/2017, 3:06 PM

## 2017-05-05 LAB — GLUCOSE, CAPILLARY: Glucose-Capillary: 115 mg/dL — ABNORMAL HIGH (ref 65–99)

## 2017-05-05 MED ORDER — CEFUROXIME AXETIL 250 MG PO TABS: 250.0000 mg | ORAL_TABLET | Freq: Two times a day (BID) | ORAL | 0 refills | Status: DC

## 2017-05-05 MED ORDER — AZITHROMYCIN 250 MG PO TABS: 250.0000 mg | ORAL_TABLET | Freq: Every day | ORAL | 0 refills | Status: DC

## 2017-05-05 MED ORDER — OSELTAMIVIR PHOSPHATE 30 MG PO CAPS: 30.0000 mg | ORAL_CAPSULE | Freq: Two times a day (BID) | ORAL | 0 refills | Status: DC

## 2017-05-05 NOTE — Care Management Note (Signed)
Case Management Note  Patient Details  Name: [name] MRN: [id number] Date of Birth: 1934/04/10  Subjective/Objective:  Attempted to speak with this pleasantly confused 83 y.o. Gentleman who is to be discharged today with his wife who cares for him. She tells me she would be open to [facility] evaluating him for HHPT but that he attends Adult Day Care 5 Days/week and will also need a release note from MD. CM made Ree Kida, MD aware of this need. Jermaine, rep for [facility] will contact family to arrange [facility]. All face sheet info confirmed as correct                  Action/Plan:CM will sign off for now but will be available should additional discharge needs arise or disposition change.    Expected Discharge Date:  05/05/17               Expected Discharge Plan:     In-House Referral:     Discharge planning Services  CM Consult  Post Acute Care Choice:  Home Health Choice offered to:  Spouse  DME Arranged:    DME Agency:  [place]:  PT [facility] Agency:     Status of Service:  Completed, signed off  If discussed at [place] of Stay Meetings, dates discussed:    Additional Comments:  Delrae Sawyers, RN 05/05/2017, 11:06 AM

## 2017-05-05 NOTE — Discharge Summary (Signed)
Physician Discharge Summary  [name] TGG:[id number] DOB: Sep 23, 1934 DOA: 05/02/2017  PCP: Glendale Chard, MD  Admit date: 05/02/2017 Discharge date: 05/05/2017  Time spent: 45 minutes  Recommendations for Outpatient Follow-up:  Patient will be discharged to home with home health services, physical and occupational therapy, nursing, aide.  Patient will need to follow up with primary care provider within one week of discharge.  Patient should continue medications as prescribed.  Patient should follow a heart healthy diet.   Discharge Diagnoses:  Sepsis secondary to urinary tract infection and possible pneumonia/influenza Chronic systolic and diastolic heart failure COPD Dementia Essential hypertension Diabetes mellitus, type II Hyperlipidemia PVD  Discharge Condition: Stable  Diet recommendation: Heart healthy  Filed Weights   05/02/17 1229 05/03/17 0444  Weight: 83.9 kg (185 lb) 83.7 kg (184 lb 8.4 oz)    History of present illness:  on 05/02/2017  Edwin H Harrisonis a 83 y.o.malewith a medical history ofCHF with an EF of 20-25%, dementia, COPD, depression, presented to the emergency department with complaints of confusion. Per wife, patient has been a little more confused recently. He has also been having more urinary frequency. Patient has also developed cough as well as runny nose. He has had no complaints of chest pain, abdominal pain, changes in bowel pattern. Per wife, patient is normally constipated. He has been having cough without sputum production. Currently patient does have dementia and is unable to answer most questions.  Hospital Course:  Sepsis secondary to urinary tract infection and possible pneumonia/influenza -Patient presented with fever, tachypnea, elevated lactic acid -Chest x-ray showed decreased areas of focal airspace density in the right lung base -UA was cloudy in appearance, many bacteria, TNTC WBC, large leukocytes -Blood cultures show  no growth to date -Urine culture shows no growth -was placed on azithromycin and ceftriaxone- transitioned to ceftin and azithromycin -Influenza PCR positive for influenza A-started patient on Tamiflu -Strep pneumonia urine antigen negative -Repeat chest x-ray shows stable right basilar pneumonia  Chronic systolic and diastolic heart failure -Echocardiogram 09/05/2013 showed an EF of 70-35%, grade 2 diastolic dysfunction -will monitor intake and output, daily weights -continue home lasix -Continue digoxin, metoprolol  COPD -No wheezing noted, continue home medications of albuterol as needed along with Breo  Dementia -Perwife, patient has been mildly confused however has had dementia for quite some time. -Appears to be stable  Essential hypertension -Continue metoprolol and lasix  Diabetes mellitus, type II -hold Januvia- resume on discharge   Hyperlipidemia -Continue statin  PVD -Continue Plavix, statin  Procedures: none  Consultations: None  Discharge Exam: Vitals:   05/05/17 1221 05/05/17 1224  BP: (!) 168/78 (!) 157/85  Pulse:    Resp:    Temp:    SpO2:       General: Well developed, well nourished, NAD, appears stated age  HEENT: NCAT, mucous membranes moist.  Cardiovascular: S1 S2 auscultated, RRR, no murmur  Respiratory: Clear breath sounds, no wheezing.   Abdomen: Soft, nontender, nondistended, + bowel sounds  Extremities: warm dry without cyanosis clubbing or edema  Neuro: AAOx1, nonfocal  Psych: Pleasant   Discharge Instructions Discharge Instructions    Discharge instructions   Complete by:  As directed    Patient will be discharged to home with home health services, physical therapy.  Patient will need to follow up with primary care provider within one week of discharge.  Patient should continue medications as prescribed.  Patient should follow a heart healthy diet.     Allergies as  of 05/05/2017   No Known Allergies       Medication List    TAKE these medications   albuterol (2.5 MG/3ML) 0.083% nebulizer solution Commonly known as:  PROVENTIL Inhale 3 mLs into the lungs every 6 (six) hours as needed for wheezing or shortness of breath.   azithromycin 250 MG tablet Commonly known as:  ZITHROMAX Take 1 tablet (250 mg total) by mouth daily. Continuation of hospital course   cefUROXime 250 MG tablet Commonly known as:  CEFTIN Take 1 tablet (250 mg total) by mouth 2 (two) times daily with a meal.   clopidogrel 75 MG tablet Commonly known as:  PLAVIX Take 1 tablet (75 mg total) by mouth daily.   digoxin 0.125 MG tablet Commonly known as:  LANOXIN Take 0.125 mg by mouth.   famotidine 20 MG tablet Commonly known as:  PEPCID One at bedtime What changed:    how much to take  how to take this  when to take this  additional instructions   furosemide 20 MG tablet Commonly known as:  LASIX Take 1 tablet (20 mg total) by mouth daily.   metoprolol tartrate 25 MG tablet Commonly known as:  LOPRESSOR Take 0.5 tablets (12.5 mg total) by mouth 2 (two) times daily.   oseltamivir 30 MG capsule Commonly known as:  TAMIFLU Take 1 capsule (30 mg total) by mouth 2 (two) times daily. Continuation of hospital course   pantoprazole 40 MG tablet Commonly known as:  PROTONIX Take 1 tablet (40 mg total) by mouth daily. Take 30-60 min before first meal of the day   pravastatin 40 MG tablet Commonly known as:  PRAVACHOL Take 40 mg by mouth daily after supper.   sitaGLIPtin 100 MG tablet Commonly known as:  JANUVIA Take 100 mg by mouth daily.   umeclidinium-vilanterol 62.5-25 MCG/INH Aepb Commonly known as:  ANORO ELLIPTA Inhale 1 puff into the lungs daily.      No Known Allergies Follow-up Information    [facility]. Schedule an appointment as soon as possible for a visit in 1 week(s).   Why:  Hospital follow up, prostate  Contact information: [facility]       Glendale Chard, MD. Schedule an appointment as soon as possible for a visit in 1 week(s).   Specialty:  Internal Medicine Why:  Hospital follow up Contact information: [address] [place] [address] [place] Follow up.   Specialty:  Home Health Services Why:  HHPT has been ordered by your MD to coincide with Adult Day Care. A rep from this agency will be in touch with you within 24-48 hours of discharge Contact information: [address] [place] [address] [phone]            The results of significant diagnostics from this hospitalization (including imaging, microbiology, ancillary and laboratory) are listed below for reference.    Significant Diagnostic Studies: Dg Chest 2 View  Result Date: 05/04/2017 CLINICAL DATA:  Pneumonia EXAM: CHEST  2 VIEW COMPARISON:  05/02/2017 FINDINGS: Right lower lobe airspace opacity again noted compatible with pneumonia. No real change. Heart is normal size. Left lung is clear. No effusions or acute bony abnormality. IMPRESSION: Stable right basilar pneumonia. Electronically Signed   By: Rolm Baptise M.D.   On: 05/04/2017 09:44   Ct Head Wo Contrast  Result Date: 05/02/2017 CLINICAL  DATA:  Altered mental status EXAM: CT HEAD WITHOUT CONTRAST TECHNIQUE: Contiguous axial images were obtained from the base of the skull through the vertex without intravenous contrast. COMPARISON:  Head CT 07/04/2012 FINDINGS: Brain: No mass lesion, intraparenchymal hemorrhage or extra-axial collection. No evidence of acute cortical infarct. Unchanged communicating hydrocephalus. There is periventricular hypoattenuation compatible with chronic microvascular disease. Vascular: No hyperdense vessel or unexpected calcification. Skull: Normal visualized skull base, calvarium and extracranial soft tissues. Sinuses/Orbits: Unchanged diffuse mild mucosal  thickening of the paranasal sinuses. No fluid levels. Mastoid air cells are clear. Normal orbits. IMPRESSION: Unchanged chronic communicating hydrocephalus without acute intracranial abnormality. Electronically Signed   By: Ulyses Jarred M.D.   On: 05/02/2017 14:40   Dg Chest Portable 1 View  Result Date: 05/02/2017 CLINICAL DATA:  Cough, fever and urinary tract infection. EXAM: PORTABLE CHEST 1 VIEW COMPARISON:  02/04/2017.  03/28/2016. chest CT 09/01/2013. FINDINGS: Heart size is normal. There is aortic atherosclerosis. Left lung remains clear. The right lung shows some areas of alveolar filling at the lung base which appear more prominent than on previous studies. This could be chronic scarring or could possibly indicate tree in bud opacities as might be seen with atypical infection. There is no consolidation or lobar collapse. No evidence of heart failure or effusion. IMPRESSION: Increased areas of focal airspace density at the right lung base compared to previous studies. This could be progressive scarring or could represent an active atypical infection. No consolidation or lobar collapse. Electronically Signed   By: Nelson Chimes M.D.   On: 05/02/2017 17:19    Microbiology: Recent Results (from the past 240 hour(s))  Urine Culture     Status: None   Collection Time: 05/02/17  3:10 PM  Result Value Ref Range Status   Specimen Description URINE, CATHETERIZED  Final   Special Requests NONE  Final   Culture   Final    NO GROWTH Performed at [facility], [address], [place], [place] [address]    Report Status 05/03/2017 FINAL  Final  Culture, blood (Routine X 2) w Reflex to ID Panel     Status: None (Preliminary result)   Collection Time: 05/02/17  4:28 PM  Result Value Ref Range Status   Specimen Description BLOOD RIGHT FOREARM  Final   Special Requests IN PEDIATRIC BOTTLE Blood Culture adequate volume  Final   Culture   Final    NO GROWTH 3 DAYS Performed at [facility], [place] [address], [place], [place] [address]    Report Status PENDING  Incomplete  Culture, blood (Routine X 2) w Reflex to ID Panel     Status: None (Preliminary result)   Collection Time: 05/02/17  4:33 PM  Result Value Ref Range Status   Specimen Description BLOOD LEFT ANTECUBITAL  Final   Special Requests IN PEDIATRIC BOTTLE Blood Culture adequate volume  Final   Culture   Final    NO GROWTH 3 DAYS Performed at [facility], [place] [address], [place], [place] [address]    Report Status PENDING  Incomplete  Respiratory Panel by PCR     Status: Abnormal   Collection Time: 05/02/17  5:49 PM  Result Value Ref Range Status   Adenovirus NOT DETECTED NOT DETECTED Final   Coronavirus 229E NOT DETECTED NOT DETECTED Final   Coronavirus HKU1 NOT DETECTED NOT DETECTED Final   Coronavirus NL63 NOT DETECTED NOT DETECTED Final   Coronavirus OC43 NOT DETECTED NOT DETECTED Final  Metapneumovirus NOT DETECTED NOT DETECTED Final   Rhinovirus / Enterovirus NOT DETECTED NOT DETECTED Final   Influenza A NOT DETECTED NOT DETECTED Final   Influenza A H1 NOT DETECTED NOT DETECTED Final   Influenza A H1 2009 NOT DETECTED NOT DETECTED Final   Influenza A H3 DETECTED (A) NOT DETECTED Final   Influenza B NOT DETECTED NOT DETECTED Final   Parainfluenza Virus 1 NOT DETECTED NOT DETECTED Final   Parainfluenza Virus 2 NOT DETECTED NOT DETECTED Final   Parainfluenza Virus 3 NOT DETECTED NOT DETECTED Final   Parainfluenza Virus 4 NOT DETECTED NOT DETECTED Final   Respiratory Syncytial Virus NOT DETECTED NOT DETECTED Final   Bordetella pertussis NOT DETECTED NOT DETECTED Final   Chlamydophila pneumoniae NOT DETECTED NOT DETECTED Final   Mycoplasma pneumoniae NOT DETECTED NOT DETECTED Final     Labs: Basic Metabolic Panel: Recent Labs  Lab 05/02/17 1319 05/02/17 2032 05/03/17 0806  NA 139 138 137  K 4.9 4.2 4.1  CL 102 104 103  CO2 24 23 23   GLUCOSE 156* 157* 119*  BUN 16 14 13   CREATININE 1.29*  1.22 1.10  CALCIUM 9.5 8.5* 8.7*   Liver Function Tests: Recent Labs  Lab 05/02/17 1319 05/02/17 2032  AST 27 19  ALT 11* 10*  ALKPHOS 76 71  BILITOT 1.0 0.7  PROT 7.6 6.4*  ALBUMIN 3.7 3.1*   No results for input(s): LIPASE, AMYLASE in the last 168 hours. No results for input(s): AMMONIA in the last 168 hours. CBC: Recent Labs  Lab 05/02/17 1837 05/02/17 2032 05/03/17 0806  WBC 7.6 6.9 4.8  HGB 11.5* 11.8* 12.6*  HCT 36.5* 36.5* 39.1  MCV 88.0 88.0 88.3  PLT 255 270 242   Cardiac Enzymes: No results for input(s): CKTOTAL, CKMB, CKMBINDEX, TROPONINI in the last 168 hours. BNP: BNP (last 3 results) No results for input(s): BNP in the last 8760 hours.  ProBNP (last 3 results) No results for input(s): PROBNP in the last 8760 hours.  CBG: Recent Labs  Lab 05/04/17 0810 05/04/17 1134 05/04/17 1642 05/04/17 2230 05/05/17 0730  GLUCAP 139* 140* 96 132* 115*       Signed:  Taneah Masri  [facility] 05/05/2017, 12:36 PM

## 2017-05-05 NOTE — Care Management Note (Signed)
Case Management Note  Patient Details  Name: [name] MRN: [id number] Date of Birth: 02/14/1935  Subjective/Objective:                    Action/Plan:   Expected Discharge Date:  05/05/17               Expected Discharge Plan:     In-House Referral:     Discharge planning Services  CM Consult  Post Acute Care Choice:  Home Health Choice offered to:  Spouse  DME Arranged:    DME Agency:  [place]:  PT [facility] Agency:     Status of Service:  Completed, signed off  If discussed at [place] of Stay Meetings, dates discussed:    Additional Comments:  Delrae Sawyers, RN 05/05/2017, 11:12 AM

## 2017-05-05 NOTE — Discharge Instructions (Signed)
Influenza, Adult Influenza (the flu") is an infection in the lungs, nose, and throat (respiratory tract). It is caused by a virus. The flu causes many common cold symptoms, as well as a high fever and body aches. It can make you feel very sick. The flu spreads easily from person to person (is contagious). Getting a flu shot (influenza vaccination) every year is the best way to prevent the flu. Follow these instructions at home:  Take over-the-counter and prescription medicines only as told by your doctor.  Use a cool mist humidifier to add moisture (humidity) to the air in your home. This can make it easier to breathe.  Rest as needed.  Drink enough fluid to keep your pee (urine) clear or pale yellow.  Cover your mouth and nose when you cough or sneeze.  Wash your hands with soap and water often, especially after you cough or sneeze. If you cannot use soap and water, use hand sanitizer.  Stay home from work or school as told by your doctor. Unless you are visiting your doctor, try to avoid leaving home until your fever has been gone for 24 hours without the use of medicine.  Keep all follow-up visits as told by your doctor. This is important. How is this prevented?  Getting a yearly (annual) flu shot is the best way to avoid getting the flu. You may get the flu shot in late summer, fall, or winter. Ask your doctor when you should get your flu shot.  Wash your hands often or use hand sanitizer often.  Avoid contact with people who are sick during cold and flu season.  Eat healthy foods.  Drink plenty of fluids.  Get enough sleep.  Exercise regularly. Contact a doctor if:  You get new symptoms.  You have: ? Chest pain. ? Watery poop (diarrhea). ? A fever.  Your cough gets worse.  You start to have more mucus.  You feel sick to your stomach (nauseous).  You throw up (vomit). Get help right away if:  You start to be short of breath or have trouble breathing.  Your  skin or nails turn a bluish color.  You have very bad pain or stiffness in your neck.  You get a sudden headache.  You get sudden pain in your face or ear.  You cannot stop throwing up. This information is not intended to replace advice given to you by your health care provider. Make sure you discuss any questions you have with your health care provider. Document Released: 12/20/2007 Document Revised: 08/18/2015 Document Reviewed: 01/04/2015 Elsevier Interactive Patient Education  2017 [place] Pneumonia, Adult Pneumonia is an infection of the lungs. One type of pneumonia can happen while a person is in a hospital. A different type can happen when a person is not in a hospital (community-acquired pneumonia). It is easy for this kind to spread from person to person. It can spread to you if you breathe near an infected person who coughs or sneezes. Some symptoms include:  A dry cough.  A wet (productive) cough.  Fever.  Sweating.  Chest pain.  Follow these instructions at home:  Take over-the-counter and prescription medicines only as told by your doctor. ? Only take cough medicine if you are losing sleep. ? If you were prescribed an antibiotic medicine, take it as told by your doctor. Do not stop taking the antibiotic even if you start to feel better.  Sleep with your head and neck raised (elevated). You can  do this by putting a few pillows under your head, or you can sleep in a recliner.  Do not use tobacco products. These include cigarettes, chewing tobacco, and e-cigarettes. If you need help quitting, ask your doctor.  Drink enough water to keep your pee (urine) clear or pale yellow. A shot (vaccine) can help prevent pneumonia. Shots are often suggested for:  People older than 81 years of age.  People older than 81 years of age: ? Who are having cancer treatment. ? Who have long-term (chronic) lung disease. ? Who have problems with their body's  defense system (immune system).  You may also prevent pneumonia if you take these actions:  Get the flu (influenza) shot every year.  Go to the dentist as often as told.  Wash your hands often. If soap and water are not available, use hand sanitizer.  Contact a doctor if:  You have a fever.  You lose sleep because your cough medicine does not help. Get help right away if:  You are short of breath and it gets worse.  You have more chest pain.  Your sickness gets worse. This is very serious if: ? You are an older adult. ? Your body's defense system is weak.  You cough up blood. This information is not intended to replace advice given to you by your health care provider. Make sure you discuss any questions you have with your health care provider. Document Released: 08/29/2007 Document Revised: 08/18/2015 Document Reviewed: 07/07/2014 Elsevier Interactive Patient Education  [employer].

## 2017-05-05 NOTE — Progress Notes (Signed)
Pt. discharged to home. Pt after visit summary reviewed with wife and she is capable of re verbalizing medications and follow up appointments. Pt remains stable. No signs and symptoms of distress. Educated to return to ER in the event of SOB, dizziness, chest pain, or fainting. Mady Gemma, RN

## 2017-05-07 LAB — CULTURE, BLOOD (ROUTINE X 2)
CULTURE: NO GROWTH
CULTURE: NO GROWTH
SPECIAL REQUESTS: ADEQUATE
SPECIAL REQUESTS: ADEQUATE

## 2017-05-08 NOTE — Consult Note (Signed)
            [facility] CM Primary Care Navigator  05/08/2017  [name] 08-29-1934 [id number]   Attempt to seepatient at the bedsideto identify possible discharge needs but he was alreadydischargedper staff report.   Per MD note, patient was admitted due to increased confusion, increased urinary frequency and coughing. Patient was discharged home with home health services on 2/10.  Primary care provider's office called (Dr. Johnnye Lana) to notify of patient's discharge and need for transition of care (TOC) follow-up. Notified of health issues needing follow-up. Made aware to refer patient to [facility] CM if deemed necessary and appropriate for services.  Patient has discharge instruction to follow-up with primary care provider as soon as possible for a visit in 1 week.   For additional questions please contact:  Edwena Felty A. Lorianna Spadaccini, BSN, RN-BC [facility] PRIMARY CARE Navigator Cell: [phone]

## 2017-05-12 ENCOUNTER — Emergency Department (HOSPITAL_COMMUNITY)
Admission: EM | Admit: 2017-05-12 | Discharge: 2017-05-12 | Disposition: A | Discharge status: Home / Self Care | Payer: PPO | Attending: Emergency Medicine | Admitting: Emergency Medicine

## 2017-05-12 ENCOUNTER — Other Ambulatory Visit: Payer: Self-pay

## 2017-05-12 ENCOUNTER — Emergency Department (HOSPITAL_BASED_OUTPATIENT_CLINIC_OR_DEPARTMENT_OTHER)
Admit: 2017-05-12 | Discharge: 2017-05-12 | Disposition: A | Discharge status: Home / Self Care | Payer: PPO | Attending: Emergency Medicine | Admitting: Emergency Medicine

## 2017-05-12 ENCOUNTER — Emergency Department (HOSPITAL_COMMUNITY): Payer: PPO

## 2017-05-12 ENCOUNTER — Encounter (HOSPITAL_COMMUNITY): Payer: Self-pay

## 2017-05-12 DIAGNOSIS — Z87891 Personal history of nicotine dependence: Secondary | ICD-10-CM | POA: Diagnosis not present

## 2017-05-12 DIAGNOSIS — G4489 Other headache syndrome: Secondary | ICD-10-CM | POA: Diagnosis not present

## 2017-05-12 DIAGNOSIS — R609 Edema, unspecified: Secondary | ICD-10-CM

## 2017-05-12 DIAGNOSIS — M79605 Pain in left leg: Secondary | ICD-10-CM | POA: Diagnosis not present

## 2017-05-12 DIAGNOSIS — Y999 Unspecified external cause status: Secondary | ICD-10-CM | POA: Insufficient documentation

## 2017-05-12 DIAGNOSIS — Y939 Activity, unspecified: Secondary | ICD-10-CM | POA: Insufficient documentation

## 2017-05-12 DIAGNOSIS — F0280 Dementia in other diseases classified elsewhere without behavioral disturbance: Secondary | ICD-10-CM | POA: Diagnosis not present

## 2017-05-12 DIAGNOSIS — S199XXA Unspecified injury of neck, initial encounter: Secondary | ICD-10-CM | POA: Diagnosis not present

## 2017-05-12 DIAGNOSIS — R600 Localized edema: Secondary | ICD-10-CM | POA: Diagnosis not present

## 2017-05-12 DIAGNOSIS — M79604 Pain in right leg: Secondary | ICD-10-CM | POA: Diagnosis not present

## 2017-05-12 DIAGNOSIS — I110 Hypertensive heart disease with heart failure: Secondary | ICD-10-CM | POA: Diagnosis not present

## 2017-05-12 DIAGNOSIS — M79602 Pain in left arm: Secondary | ICD-10-CM | POA: Diagnosis not present

## 2017-05-12 DIAGNOSIS — G309 Alzheimer's disease, unspecified: Secondary | ICD-10-CM | POA: Insufficient documentation

## 2017-05-12 DIAGNOSIS — S99912A Unspecified injury of left ankle, initial encounter: Secondary | ICD-10-CM | POA: Diagnosis not present

## 2017-05-12 DIAGNOSIS — J449 Chronic obstructive pulmonary disease, unspecified: Secondary | ICD-10-CM | POA: Diagnosis not present

## 2017-05-12 DIAGNOSIS — E119 Type 2 diabetes mellitus without complications: Secondary | ICD-10-CM | POA: Insufficient documentation

## 2017-05-12 DIAGNOSIS — M79609 Pain in unspecified limb: Secondary | ICD-10-CM

## 2017-05-12 DIAGNOSIS — I509 Heart failure, unspecified: Secondary | ICD-10-CM | POA: Insufficient documentation

## 2017-05-12 DIAGNOSIS — S0083XA Contusion of other part of head, initial encounter: Secondary | ICD-10-CM | POA: Insufficient documentation

## 2017-05-12 DIAGNOSIS — M79662 Pain in left lower leg: Secondary | ICD-10-CM | POA: Insufficient documentation

## 2017-05-12 DIAGNOSIS — Y92002 Bathroom of unspecified non-institutional (private) residence single-family (private) house as the place of occurrence of the external cause: Secondary | ICD-10-CM | POA: Insufficient documentation

## 2017-05-12 DIAGNOSIS — W19XXXA Unspecified fall, initial encounter: Secondary | ICD-10-CM | POA: Insufficient documentation

## 2017-05-12 DIAGNOSIS — S0990XA Unspecified injury of head, initial encounter: Secondary | ICD-10-CM | POA: Diagnosis not present

## 2017-05-12 DIAGNOSIS — M25572 Pain in left ankle and joints of left foot: Secondary | ICD-10-CM | POA: Diagnosis not present

## 2017-05-12 DIAGNOSIS — S098XXA Other specified injuries of head, initial encounter: Secondary | ICD-10-CM | POA: Diagnosis not present

## 2017-05-12 DIAGNOSIS — S8992XA Unspecified injury of left lower leg, initial encounter: Secondary | ICD-10-CM | POA: Diagnosis not present

## 2017-05-12 LAB — CBC WITH DIFFERENTIAL/PLATELET
BASOS ABS: 0 10*3/uL (ref 0.0–0.1)
Basophils Relative: 0 %
EOS ABS: 0.5 10*3/uL (ref 0.0–0.7)
EOS PCT: 6 %
HCT: 38.4 % — ABNORMAL LOW (ref 39.0–52.0)
Hemoglobin: 12.6 g/dL — ABNORMAL LOW (ref 13.0–17.0)
Lymphocytes Relative: 21 %
Lymphs Abs: 1.6 10*3/uL (ref 0.7–4.0)
MCH: 28.8 pg (ref 26.0–34.0)
MCHC: 32.8 g/dL (ref 30.0–36.0)
MCV: 87.7 fL (ref 78.0–100.0)
MONO ABS: 0.8 10*3/uL (ref 0.1–1.0)
Monocytes Relative: 10 %
Neutro Abs: 4.6 10*3/uL (ref 1.7–7.7)
Neutrophils Relative %: 63 %
PLATELETS: 365 10*3/uL (ref 150–400)
RBC: 4.38 MIL/uL (ref 4.22–5.81)
RDW: 14.7 % (ref 11.5–15.5)
WBC: 7.5 10*3/uL (ref 4.0–10.5)

## 2017-05-12 LAB — BASIC METABOLIC PANEL
ANION GAP: 8 (ref 5–15)
BUN: 12 mg/dL (ref 6–20)
CO2: 28 mmol/L (ref 22–32)
Calcium: 8.8 mg/dL — ABNORMAL LOW (ref 8.9–10.3)
Chloride: 104 mmol/L (ref 101–111)
Creatinine, Ser: 0.93 mg/dL (ref 0.61–1.24)
Glucose, Bld: 120 mg/dL — ABNORMAL HIGH (ref 65–99)
POTASSIUM: 4.1 mmol/L (ref 3.5–5.1)
SODIUM: 140 mmol/L (ref 135–145)

## 2017-05-12 LAB — I-STAT CG4 LACTIC ACID, ED: LACTIC ACID, VENOUS: 2.4 mmol/L — AB (ref 0.5–1.9)

## 2017-05-12 LAB — PROTIME-INR
INR: 0.96
PROTHROMBIN TIME: 12.7 s (ref 11.4–15.2)

## 2017-05-12 MED ORDER — SODIUM CHLORIDE 0.9 % IV BOLUS (SEPSIS)
1000.0000 mL | Freq: Once | INTRAVENOUS | Status: AC
  Administered 2017-05-12: 1000 mL via INTRAVENOUS

## 2017-05-12 MED ORDER — DOXYCYCLINE HYCLATE 100 MG PO CAPS: 100.0000 mg | ORAL_CAPSULE | Freq: Two times a day (BID) | ORAL | 0 refills | Status: DC

## 2017-05-12 MED ORDER — DOXYCYCLINE HYCLATE 100 MG PO TABS
100.0000 mg | ORAL_TABLET | Freq: Once | ORAL | Status: AC
  Administered 2017-05-12: 100 mg via ORAL
  Filled 2017-05-12: qty 1

## 2017-05-12 NOTE — ED Provider Notes (Signed)
Medical screening examination/treatment/procedure(s) were conducted as a shared visit with non-physician practitioner(s) and myself.  I personally evaluated the patient during the encounter.   EKG Interpretation None      Results for orders placed or performed during the hospital encounter of 05/02/17  Culture, blood (Routine X 2) w Reflex to ID Panel  Result Value Ref Range   Specimen Description BLOOD RIGHT FOREARM    Special Requests IN PEDIATRIC BOTTLE Blood Culture adequate volume    Culture      NO GROWTH 5 DAYS Performed at [facility] [address] [place], [place] [address]    Report Status 05/07/2017 FINAL   Culture, blood (Routine X 2) w Reflex to ID Panel  Result Value Ref Range   Specimen Description BLOOD LEFT ANTECUBITAL    Special Requests IN PEDIATRIC BOTTLE Blood Culture adequate volume    Culture      NO GROWTH 5 DAYS Performed at [facility], [place] [address] [place], [place] [address]    Report Status 05/07/2017 FINAL   Urine Culture  Result Value Ref Range   Specimen Description URINE, CATHETERIZED    Special Requests NONE    Culture      NO GROWTH Performed at [facility] [address] [place], [place] [address]    Report Status 05/03/2017 FINAL   Respiratory Panel by PCR  Result Value Ref Range   Adenovirus NOT DETECTED NOT DETECTED   Coronavirus 229E NOT DETECTED NOT DETECTED   Coronavirus HKU1 NOT DETECTED NOT DETECTED   Coronavirus NL63 NOT DETECTED NOT DETECTED   Coronavirus OC43 NOT DETECTED NOT DETECTED   Metapneumovirus NOT DETECTED NOT DETECTED   Rhinovirus / Enterovirus NOT DETECTED NOT DETECTED   Influenza A NOT DETECTED NOT DETECTED   Influenza A H1 NOT DETECTED NOT DETECTED   Influenza A H1 2009 NOT DETECTED NOT DETECTED   Influenza A H3 DETECTED (A) NOT DETECTED   Influenza B NOT DETECTED NOT DETECTED   Parainfluenza Virus 1 NOT DETECTED NOT DETECTED   Parainfluenza Virus 2 NOT DETECTED NOT DETECTED    Parainfluenza Virus 3 NOT DETECTED NOT DETECTED   Parainfluenza Virus 4 NOT DETECTED NOT DETECTED   Respiratory Syncytial Virus NOT DETECTED NOT DETECTED   Bordetella pertussis NOT DETECTED NOT DETECTED   Chlamydophila pneumoniae NOT DETECTED NOT DETECTED   Mycoplasma pneumoniae NOT DETECTED NOT DETECTED  Comprehensive metabolic panel  Result Value Ref Range   Sodium 139 135 - 145 mmol/L   Potassium 4.9 3.5 - 5.1 mmol/L   Chloride 102 101 - 111 mmol/L   CO2 24 22 - 32 mmol/L   Glucose, Bld 156 (H) 65 - 99 mg/dL   BUN 16 6 - 20 mg/dL   Creatinine, Ser 1.29 (H) 0.61 - 1.24 mg/dL   Calcium 9.5 8.9 - 10.3 mg/dL   Total Protein 7.6 6.5 - 8.1 g/dL   Albumin 3.7 3.5 - 5.0 g/dL   AST 27 15 - 41 U/L   ALT 11 (L) 17 - 63 U/L   Alkaline Phosphatase 76 38 - 126 U/L   Total Bilirubin 1.0 0.3 - 1.2 mg/dL   GFR calc non Af Amer 51 (L) >60 mL/min   GFR calc Af Amer 59 (L) >60 mL/min   Anion gap 13 5 - 15  Urinalysis, Complete w Microscopic  Result Value Ref Range   Color, Urine YELLOW YELLOW   APPearance CLOUDY (A) CLEAR   Specific Gravity, Urine 1.012  1.005 - 1.030   pH 5.0 5.0 - 8.0   Glucose, UA NEGATIVE NEGATIVE mg/dL   Hgb urine dipstick MODERATE (A) NEGATIVE   Bilirubin Urine NEGATIVE NEGATIVE   Ketones, ur NEGATIVE NEGATIVE mg/dL   Protein, ur 30 (A) NEGATIVE mg/dL   Nitrite NEGATIVE NEGATIVE   Leukocytes, UA LARGE (A) NEGATIVE   RBC / HPF 6-30 0 - 5 RBC/hpf   WBC, UA TOO NUMEROUS TO COUNT 0 - 5 WBC/hpf   Bacteria, UA MANY (A) NONE SEEN   Squamous Epithelial / LPF 0-5 (A) NONE SEEN   WBC Clumps PRESENT    Mucus PRESENT   Influenza panel by PCR (type A & B)  Result Value Ref Range   Influenza A By PCR POSITIVE (A) NEGATIVE   Influenza B By PCR NEGATIVE NEGATIVE  CBC  Result Value Ref Range   WBC 7.6 4.0 - 10.5 K/uL   RBC 4.15 (L) 4.22 - 5.81 MIL/uL   Hemoglobin 11.5 (L) 13.0 - 17.0 g/dL   HCT 36.5 (L) 39.0 - 52.0 %   MCV 88.0 78.0 - 100.0 fL   MCH 27.7 26.0 - 34.0 pg    MCHC 31.5 30.0 - 36.0 g/dL   RDW 15.1 11.5 - 15.5 %   Platelets 255 150 - 400 K/uL  Strep pneumoniae urinary antigen  Result Value Ref Range   Strep Pneumo Urinary Antigen NEGATIVE NEGATIVE  Legionella Pneumophila Serogp 1 Ur Ag  Result Value Ref Range   L. pneumophila Serogp 1 Ur Ag Negative Negative   Source of Sample URINE, RANDOM   Comprehensive metabolic panel  Result Value Ref Range   Sodium 138 135 - 145 mmol/L   Potassium 4.2 3.5 - 5.1 mmol/L   Chloride 104 101 - 111 mmol/L   CO2 23 22 - 32 mmol/L   Glucose, Bld 157 (H) 65 - 99 mg/dL   BUN 14 6 - 20 mg/dL   Creatinine, Ser 1.22 0.61 - 1.24 mg/dL   Calcium 8.5 (L) 8.9 - 10.3 mg/dL   Total Protein 6.4 (L) 6.5 - 8.1 g/dL   Albumin 3.1 (L) 3.5 - 5.0 g/dL   AST 19 15 - 41 U/L   ALT 10 (L) 17 - 63 U/L   Alkaline Phosphatase 71 38 - 126 U/L   Total Bilirubin 0.7 0.3 - 1.2 mg/dL   GFR calc non Af Amer 54 (L) >60 mL/min   GFR calc Af Amer >60 >60 mL/min   Anion gap 11 5 - 15  Lactic acid, plasma  Result Value Ref Range   Lactic Acid, Venous 1.6 0.5 - 1.9 mmol/L  Lactic acid, plasma  Result Value Ref Range   Lactic Acid, Venous 1.2 0.5 - 1.9 mmol/L  Procalcitonin  Result Value Ref Range   Procalcitonin <0.10 ng/mL  Protime-INR  Result Value Ref Range   Prothrombin Time 13.6 11.4 - 15.2 seconds   INR 1.05   APTT  Result Value Ref Range   aPTT 29 24 - 36 seconds  CBC  Result Value Ref Range   WBC 6.9 4.0 - 10.5 K/uL   RBC 4.15 (L) 4.22 - 5.81 MIL/uL   Hemoglobin 11.8 (L) 13.0 - 17.0 g/dL   HCT 36.5 (L) 39.0 - 52.0 %   MCV 88.0 78.0 - 100.0 fL   MCH 28.4 26.0 - 34.0 pg   MCHC 32.3 30.0 - 36.0 g/dL   RDW 15.4 11.5 - 15.5 %   Platelets 270 150 - 400 K/uL  CBC  Result Value Ref Range   WBC 4.8 4.0 - 10.5 K/uL   RBC 4.43 4.22 - 5.81 MIL/uL   Hemoglobin 12.6 (L) 13.0 - 17.0 g/dL   HCT 39.1 39.0 - 52.0 %   MCV 88.3 78.0 - 100.0 fL   MCH 28.4 26.0 - 34.0 pg   MCHC 32.2 30.0 - 36.0 g/dL   RDW 15.1 11.5 - 15.5 %    Platelets 242 150 - 400 K/uL  Basic metabolic panel  Result Value Ref Range   Sodium 137 135 - 145 mmol/L   Potassium 4.1 3.5 - 5.1 mmol/L   Chloride 103 101 - 111 mmol/L   CO2 23 22 - 32 mmol/L   Glucose, Bld 119 (H) 65 - 99 mg/dL   BUN 13 6 - 20 mg/dL   Creatinine, Ser 1.10 0.61 - 1.24 mg/dL   Calcium 8.7 (L) 8.9 - 10.3 mg/dL   GFR calc non Af Amer >60 >60 mL/min   GFR calc Af Amer >60 >60 mL/min   Anion gap 11 5 - 15  Glucose, capillary  Result Value Ref Range   Glucose-Capillary 145 (H) 65 - 99 mg/dL  Glucose, capillary  Result Value Ref Range   Glucose-Capillary 161 (H) 65 - 99 mg/dL  Glucose, capillary  Result Value Ref Range   Glucose-Capillary 103 (H) 65 - 99 mg/dL  Glucose, capillary  Result Value Ref Range   Glucose-Capillary 107 (H) 65 - 99 mg/dL  Glucose, capillary  Result Value Ref Range   Glucose-Capillary 103 (H) 65 - 99 mg/dL  Glucose, capillary  Result Value Ref Range   Glucose-Capillary 139 (H) 65 - 99 mg/dL  Glucose, capillary  Result Value Ref Range   Glucose-Capillary 140 (H) 65 - 99 mg/dL  Glucose, capillary  Result Value Ref Range   Glucose-Capillary 96 65 - 99 mg/dL  Glucose, capillary  Result Value Ref Range   Glucose-Capillary 132 (H) 65 - 99 mg/dL  Glucose, capillary  Result Value Ref Range   Glucose-Capillary 115 (H) 65 - 99 mg/dL  CBG monitoring, ED  Result Value Ref Range   Glucose-Capillary 123 (H) 65 - 99 mg/dL  I-Stat CG4 Lactic Acid, ED  Result Value Ref Range   Lactic Acid, Venous 2.54 (HH) 0.5 - 1.9 mmol/L   Comment NOTIFIED PHYSICIAN   I-Stat CG4 Lactic Acid, ED  Result Value Ref Range   Lactic Acid, Venous 1.54 0.5 - 1.9 mmol/L  CBG monitoring, ED  Result Value Ref Range   Glucose-Capillary 113 (H) 65 - 99 mg/dL   Dg Chest 2 View  Result Date: 05/04/2017 CLINICAL DATA:  Pneumonia EXAM: CHEST  2 VIEW COMPARISON:  05/02/2017 FINDINGS: Right lower lobe airspace opacity again noted compatible with pneumonia. No real change.  Heart is normal size. Left lung is clear. No effusions or acute bony abnormality. IMPRESSION: Stable right basilar pneumonia. Electronically Signed   By: Rolm Baptise M.D.   On: 05/04/2017 09:44   Dg Tibia/fibula Left  Result Date: 05/12/2017 CLINICAL DATA:  Unwitnessed fall in bathroom. EXAM: LEFT TIBIA AND FIBULA - 2 VIEW COMPARISON:  None FINDINGS: There is no evidence of fracture or other focal bone lesions. Soft tissues are unremarkable. IMPRESSION: Negative. Electronically Signed   By: Kerby Moors M.D.   On: 05/12/2017 15:41   Dg Ankle Complete Left  Result Date: 05/12/2017 CLINICAL DATA:  Pain after fall EXAM: LEFT ANKLE COMPLETE - 3+ VIEW COMPARISON:  None. FINDINGS: There is no evidence of fracture, dislocation, or  joint effusion. There is no evidence of arthropathy or other focal bone abnormality. Soft tissues are unremarkable. IMPRESSION: Negative. Electronically Signed   By: Dorise Bullion III M.D   On: 05/12/2017 15:41   Ct Head Wo Contrast  Result Date: 05/12/2017 CLINICAL DATA:  Unwitnessed fall in bathroom with left forehead hematoma. EXAM: CT HEAD WITHOUT CONTRAST CT CERVICAL SPINE WITHOUT CONTRAST TECHNIQUE: Multidetector CT imaging of the head and cervical spine was performed following the standard protocol without intravenous contrast. Multiplanar CT image reconstructions of the cervical spine were also generated. COMPARISON:  Head CT 05/02/2017 and 07/04/2012 FINDINGS: CT HEAD FINDINGS Brain: Examination demonstrates no change in prominent ventricular system. Subtle chronic ischemic microvascular disease. No mass, mass effect, shift of midline structures or acute hemorrhage. No evidence of acute infarction. Vascular: No hyperdense vessel or unexpected calcification. Skull: Normal. Negative for fracture or focal lesion. Sinuses/Orbits: Orbits are normal. Paranasal sinuses are well developed and well aerated with mild mucosal membrane thickening over the right maxillary sinus.  Other: None. CT CERVICAL SPINE FINDINGS Alignment: Normal. Skull base and vertebrae: Vertebral body heights are normal. There is moderate spondylosis of the cervical spine to include facet arthropathy and uncovertebral joint spurring. Atlantoaxial articulation is within normal. There is severe bilateral neural foraminal narrowing at multiple levels due to adjacent bony spurring. There is no acute fracture or subluxation. Soft tissues and spinal canal: No prevertebral fluid or swelling. No visible canal hematoma. Disc levels: Disc space narrowing at the C5-6 level with near obliteration of the disc space at the C6-7 level. Upper chest: Negative. Other: None. IMPRESSION: Evidence of stable chronic communicating hydrocephalus. Mild chronic ischemic microvascular disease. No acute cervical spine injury. Moderate spondylosis of the cervical spine with significant multilevel neural foraminal narrowing and moderate disc disease at the C6-7 level greater than the C5-6 level. Minimal chronic sinus inflammatory change involving the right maxillary sinus. Electronically Signed   By: Marin Olp M.D.   On: 05/12/2017 14:43   Ct Head Wo Contrast  Result Date: 05/02/2017 CLINICAL DATA:  Altered mental status EXAM: CT HEAD WITHOUT CONTRAST TECHNIQUE: Contiguous axial images were obtained from the base of the skull through the vertex without intravenous contrast. COMPARISON:  Head CT 07/04/2012 FINDINGS: Brain: No mass lesion, intraparenchymal hemorrhage or extra-axial collection. No evidence of acute cortical infarct. Unchanged communicating hydrocephalus. There is periventricular hypoattenuation compatible with chronic microvascular disease. Vascular: No hyperdense vessel or unexpected calcification. Skull: Normal visualized skull base, calvarium and extracranial soft tissues. Sinuses/Orbits: Unchanged diffuse mild mucosal thickening of the paranasal sinuses. No fluid levels. Mastoid air cells are clear. Normal orbits.  IMPRESSION: Unchanged chronic communicating hydrocephalus without acute intracranial abnormality. Electronically Signed   By: Ulyses Jarred M.D.   On: 05/02/2017 14:40   Ct Cervical Spine Wo Contrast  Result Date: 05/12/2017 CLINICAL DATA:  Unwitnessed fall in bathroom with left forehead hematoma. EXAM: CT HEAD WITHOUT CONTRAST CT CERVICAL SPINE WITHOUT CONTRAST TECHNIQUE: Multidetector CT imaging of the head and cervical spine was performed following the standard protocol without intravenous contrast. Multiplanar CT image reconstructions of the cervical spine were also generated. COMPARISON:  Head CT 05/02/2017 and 07/04/2012 FINDINGS: CT HEAD FINDINGS Brain: Examination demonstrates no change in prominent ventricular system. Subtle chronic ischemic microvascular disease. No mass, mass effect, shift of midline structures or acute hemorrhage. No evidence of acute infarction. Vascular: No hyperdense vessel or unexpected calcification. Skull: Normal. Negative for fracture or focal lesion. Sinuses/Orbits: Orbits are normal. Paranasal sinuses are well developed and  well aerated with mild mucosal membrane thickening over the right maxillary sinus. Other: None. CT CERVICAL SPINE FINDINGS Alignment: Normal. Skull base and vertebrae: Vertebral body heights are normal. There is moderate spondylosis of the cervical spine to include facet arthropathy and uncovertebral joint spurring. Atlantoaxial articulation is within normal. There is severe bilateral neural foraminal narrowing at multiple levels due to adjacent bony spurring. There is no acute fracture or subluxation. Soft tissues and spinal canal: No prevertebral fluid or swelling. No visible canal hematoma. Disc levels: Disc space narrowing at the C5-6 level with near obliteration of the disc space at the C6-7 level. Upper chest: Negative. Other: None. IMPRESSION: Evidence of stable chronic communicating hydrocephalus. Mild chronic ischemic microvascular disease. No  acute cervical spine injury. Moderate spondylosis of the cervical spine with significant multilevel neural foraminal narrowing and moderate disc disease at the C6-7 level greater than the C5-6 level. Minimal chronic sinus inflammatory change involving the right maxillary sinus. Electronically Signed   By: Marin Olp M.D.   On: 05/12/2017 14:43   Dg Chest Portable 1 View  Result Date: 05/02/2017 CLINICAL DATA:  Cough, fever and urinary tract infection. EXAM: PORTABLE CHEST 1 VIEW COMPARISON:  02/04/2017.  03/28/2016. chest CT 09/01/2013. FINDINGS: Heart size is normal. There is aortic atherosclerosis. Left lung remains clear. The right lung shows some areas of alveolar filling at the lung base which appear more prominent than on previous studies. This could be chronic scarring or could possibly indicate tree in bud opacities as might be seen with atypical infection. There is no consolidation or lobar collapse. No evidence of heart failure or effusion. IMPRESSION: Increased areas of focal airspace density at the right lung base compared to previous studies. This could be progressive scarring or could represent an active atypical infection. No consolidation or lobar collapse. Electronically Signed   By: Nelson Chimes M.D.   On: 05/02/2017 17:19    Patient seen by me along with the physician assistant.  Patient has a history of dementia.  Has a history of peripheral vascular disease.  Has had vascular procedures to increase blood flow to the right leg.  Patient today with a unwitnessed fall but they heard him fall in the bathroom and family members went in immediately.  So no syncope or loss of consciousness.  Patient apparently complaining of left leg pain throughout the night.  And his wife noted that it was swollen.  Past medical history significant for the diagnosis of flu and pneumonia beginning of the month.  Was taking antibiotics for that.  Overall that seems to be better.  Also history of congestive  heart failure.  For the fall CT head and neck without any acute changes or findings.  Regarding the left lower extremity there is erythema and swelling to the level of the knee there is some swelling to the right leg but significantly different on the left.  Increased warmth mostly around the ankle and forefoot area.  Cap refill is good on both feet.  Actually better on the left than the right.  No obvious deformity.  X-rays of that area negative ankle and tib-fib.  Findings of the erythematous suggestive of an inflammatory arthritis but could be a cellulitis.  Would recommend treating with doxycycline in case his cellulitis.  And pain control for if it is inflammatory arthritis.  Also recommend Doppler studies of the left lower extremity to rule out any DVT.  Not concerned about ischemia.   Fredia Sorrow, MD 05/12/17 [phone]

## 2017-05-12 NOTE — ED Notes (Signed)
Pt transported to CT ?

## 2017-05-12 NOTE — ED Notes (Signed)
Unsuccessful stick x2. Informed that I may need to used the [place]. Another person to stick prior to that option.

## 2017-05-12 NOTE — ED Provider Notes (Signed)
[facility] DEPT Provider Note   CSN: [id number] Arrival date & time: 05/12/17  1234     History   Chief Complaint Chief Complaint  Patient presents with  . Fall    HPI [name] is a 83 y.o. male.  HPI  [name] is an 83yo male with a history of Alzheimer's dementia, CHF (EF 25%), hypertension, hyperlipidemia, type 2 diabetes, COPD, PVD who presents to the emergency department following an unwitnessed fall.  Level 5 caveat applies given patient's dementia.  Per patient's daughter at bedside, she heard patient fall while he was in the bathroom this morning.  When she went into see him, he was holding his head and laying flat on his back.  He was unable to stand up on his own and she was unable to bring him to standing.  She noticed a bump on the top of his head and was concerned so she brought him in.  He is otherwise at his mental baseline.  She denies vomiting.  He lives with his wife and has a walker at home which he uses occasionally, was not using it today when he fell.  Patient was recently discharged from the hospital 2/7 for pneumonia and has completed his antibiotics. He takes plavix.   Per patient's wife, he was complaining all night yesterday of leg pain.   Past Medical History:  Diagnosis Date  . Alzheimer's dementia 2013  . Anemia    HX OF ANEMIA  . CHF (congestive heart failure) ([place])   . Colon cancer ([place])   . COPD (chronic obstructive pulmonary disease) ([place])   . Depression   . Dyspnea   . Hyperlipidemia   . Hypertension   . Pneumonia 2014  . PVD (peripheral vascular disease) ([place])   . Type II diabetes mellitus [facility])     Patient Active Problem List   Diagnosis Date Noted  . Sepsis ([place]) 05/02/2017  . Diabetes mellitus ([place]) 12/25/2013  . Type I (juvenile type) diabetes mellitus with peripheral circulatory disorders, not stated as uncontrolled(250.71) 12/18/2013  . Acute DVT (deep venous thrombosis) ([place]) 12/12/2013    . PNA (pneumonia) 12/11/2013  . Candida esophagitis ([place]) 09/06/2013  . Chronic systolic CHF (congestive heart failure) ([place]) 09/06/2013  . CAP (community acquired pneumonia) 09/05/2013  . COPD exacerbation ([place]) 09/04/2013  . Microcytic anemia 09/03/2013  . Chronic respiratory failure with hypoxia ([place]) 09/01/2013  . Symptomatic anemia 09/01/2013  . DOE (dyspnea on exertion) 09/01/2013  . Atherosclerosis of native arteries of the extremities with ulceration ([place]) 06/24/2013  . PAD (peripheral artery disease) ([place]) 04/28/2013  . Chest pain 03/23/2013  . Nausea with vomiting 03/23/2013  . Pain in joint, ankle and foot 03/23/2013  . Dementia 07/04/2012  . URI (upper respiratory infection) 07/04/2012  . PVD (peripheral vascular disease) ([place]) 04/21/2012  . Leg pain 04/18/2012  . Upper airway cough syndrome 06/02/2007  . Hyperlipidemia 05/07/2007  . PVD 05/07/2007  . COPD COPD II  05/07/2007  . DYSPNEA 05/07/2007  . Essential hypertension 05/07/2007    Past Surgical History:  Procedure Laterality Date  . ABDOMINAL ANGIOGRAM N/A 04/20/2013   Procedure: ABDOMINAL ANGIOGRAM;  Surgeon: Angelia Mould, MD;  Location: [facility] CATH LAB;  Service: Cardiovascular;  Laterality: N/A;  . ABDOMINAL AORTOGRAM W/LOWER EXTREMITY N/A 11/20/2016   Procedure: ABDOMINAL AORTOGRAM W/LOWER EXTREMITY;  Surgeon: Serafina Mitchell, MD;  Location: [facility] CV LAB;  Service: Cardiovascular;  Laterality: N/A;  . CATARACT EXTRACTION Right 02/2013  .  CATARACT EXTRACTION W/ INTRAOCULAR LENS IMPLANT Left 01/2013  . COLON RESECTION  1997   /enc. notes 05/17/2004 (04/28/2013)  . COLON SURGERY  1996   cancer  . ESOPHAGOGASTRODUODENOSCOPY N/A 09/05/2013   Procedure: ESOPHAGOGASTRODUODENOSCOPY (EGD);  Surgeon: Lear Ng, MD;  Location: [facility] ENDOSCOPY;  Service: Endoscopy;  Laterality: N/A;  . FEMORAL ARTERY - FEMORAL ARTERY BYPASS GRAFT     right to left/enc. notes 05/17/2004 (04/28/2013)  . FEMORAL BYPASS  Right    /enc. notes 05/17/2004 (04/28/2013)  . FEMORAL-POPLITEAL BYPASS GRAFT Right 04/29/2013   Procedure:  FEMORAL-POPLITEAL ARTERY Bypass Graft with intraoperative ultrasound and arteriogarm;  Surgeon: Mal Misty, MD;  Location: [facility];  Service: Vascular;  Laterality: Right;  . FIBEROPTIC BRONCHOSCOPY     Yvette Rack. notes 03/16/2005 (04/28/2013)  . INTRAOPERATIVE ARTERIOGRAM Right 04/29/2013   Procedure: INTRA OPERATIVE ARTERIOGRAM;  Surgeon: Mal Misty, MD;  Location: [facility];  Service: Vascular;  Laterality: Right;  . LOWER EXTREMITY ANGIOGRAM  04/20/2013   Procedure: LOWER EXTREMITY ANGIOGRAM;  Surgeon: Angelia Mould, MD;  Location[facility] CATH LAB;  Service: Cardiovascular;;  . LUMBAR [place] SURGERY  2005  . PERCUTANEOUS STENT INTERVENTION  04/20/2013   Procedure: PERCUTANEOUS STENT INTERVENTION;  Surgeon: Angelia Mould, MD;  Location: [facility] CATH LAB;  Service: Cardiovascular;;  right common iliac artery  . PERIPHERAL VASCULAR CATHETERIZATION N/A 01/31/2016   Procedure: Abdominal Aortogram w/Lower Extremity;  Surgeon: Serafina Mitchell, MD;  Location: [facility] CV LAB;  Service: Cardiovascular;  Laterality: N/A;  . PERIPHERAL VASCULAR CATHETERIZATION Right 01/31/2016   Procedure: Peripheral Vascular Atherectomy;  Surgeon: Serafina Mitchell, MD;  Location: [facility] CV LAB;  Service: Cardiovascular;  Laterality: Right;  Iliac common and external  . PERIPHERAL VASCULAR CATHETERIZATION Right 01/31/2016   Procedure: Peripheral Vascular Intervention;  Surgeon: Serafina Mitchell, MD;  Location: [facility] CV LAB;  Service: Cardiovascular;  Laterality: Right;  external iliac and common iliac  . PERIPHERAL VASCULAR CATHETERIZATION N/A 04/24/2016   Procedure: Abdominal Aortogram w/Lower Extremity;  Surgeon: Serafina Mitchell, MD;  Location: [facility] CV LAB;  Service: Cardiovascular;  Laterality: N/A;  . PERIPHERAL VASCULAR CATHETERIZATION Right 04/24/2016   Procedure: Peripheral Vascular Intervention;   Surgeon: Serafina Mitchell, MD;  Location: [facility] CV LAB;  Service: Cardiovascular;  Laterality: Right;  external illiac Rt  . SPINE SURGERY    . VASCULAR SURGERY         Home Medications    Prior to Admission medications   Medication Sig Start Date End Date Taking? Authorizing Provider  albuterol (PROVENTIL) (2.5 MG/3ML) 0.083% nebulizer solution Inhale 3 mLs into the lungs every 6 (six) hours as needed for wheezing or shortness of breath.  10/14/15  Yes [provider]  clopidogrel (PLAVIX) 75 MG tablet Take 1 tablet (75 mg total) by mouth daily. 01/31/16  Yes Serafina Mitchell, MD  digoxin (LANOXIN) 0.125 MG tablet Take 0.125 mg by mouth.    Yes [provider]  famotidine (PEPCID) 20 MG tablet One at bedtime Patient taking differently: Take 20 mg by mouth at bedtime. One at bedtime 04/26/17  Yes Tanda Rockers, MD  furosemide (LASIX) 20 MG tablet Take 1 tablet (20 mg total) by mouth daily. 10/27/13  Yes Tanna Furry, MD  metoprolol tartrate (LOPRESSOR) 25 MG tablet Take 0.5 tablets (12.5 mg total) by mouth 2 (two) times daily. 09/07/13  Yes Samuella Cota, MD  pantoprazole (PROTONIX) 40 MG tablet Take 1 tablet (40 mg  total) by mouth daily. Take 30-60 min before first meal of the day 04/26/17  Yes Tanda Rockers, MD  pravastatin (PRAVACHOL) 40 MG tablet Take 40 mg by mouth daily after supper.    Yes [provider]  sitaGLIPtin (JANUVIA) 100 MG tablet Take 100 mg by mouth daily.   Yes [provider]  azithromycin (ZITHROMAX) 250 MG tablet Take 1 tablet (250 mg total) by mouth daily. Continuation of hospital course Patient not taking: Reported on 05/12/2017 05/05/17   Cristal Ford, DO  cefUROXime (CEFTIN) 250 MG tablet Take 1 tablet (250 mg total) by mouth 2 (two) times daily with a meal. Patient not taking: Reported on 05/12/2017 05/05/17   Cristal Ford, DO  oseltamivir (TAMIFLU) 30 MG capsule Take 1 capsule (30 mg total) by mouth 2 (two) times  daily. Continuation of hospital course Patient not taking: Reported on 05/12/2017 05/05/17   Cristal Ford, DO  umeclidinium-vilanterol (ANORO ELLIPTA) 62.5-25 MCG/INH AEPB Inhale 1 puff into the lungs daily.    [provider]    Family History Family History  Problem Relation Age of Onset  . Stroke Mother 41  . Hypertension Mother   . Cancer Sister   . Heart disease Sister   . Heart disease Brother        Heart Disease before age 68  . Heart attack Brother   . Cancer Sister   . Cancer Brother     Social History Social History   Tobacco Use  . Smoking status: Former Smoker    Packs/day: 0.30    Years: 40.00    Pack years: 12.00    Types: Cigarettes    Last attempt to quit: 03/26/1993    Years since quitting: 24.1  . Smokeless tobacco: Never Used  Substance Use Topics  . Alcohol use: No  . Drug use: No     Allergies   Patient has no known allergies.   Review of Systems Review of Systems  Unable to perform ROS: Dementia     Physical Exam Updated Vital Signs BP (!) 151/89 (BP Location: Right Arm)   Pulse 60   Temp 97.6 F (36.4 C)   Resp 14   SpO2 100%   Physical Exam  Constitutional: He appears well-developed and well-nourished. No distress.  Laying at bedside in no apparent distress.   HENT:  Head: Normocephalic and atraumatic.  Mouth/Throat: Oropharynx is clear and moist. No oropharyngeal exudate.  No open wound on the scalp or face. No battle sign or racoon eyes. Approximately 3cm hematoma on the left forehead. Tender to the touch. No nasal deformity.   Eyes: Conjunctivae are normal. Pupils are equal, round, and reactive to light. Right eye exhibits no discharge. Left eye exhibits no discharge.  Neck:  C-collar in place  Cardiovascular: Normal rate, regular rhythm and intact distal pulses. Exam reveals no friction rub.  No murmur heard. Pulmonary/Chest: Effort normal and breath sounds normal. No stridor. No respiratory distress. He has no  wheezes. He has no rales.  Abdominal: Soft. Bowel sounds are normal. There is no tenderness. There is no guarding.  Musculoskeletal:  Moving all extremities. Strength 5/5 in bilateral upper and lower extremities. No tenderness over bilateral arms. No midline t-spine or l-spine tenderness.   Left ankle appears erythematous and is warm to the touch. Tender to palpation over the left anterior shin. Left leg with 1+ pitting edema up to the mid-shin.  No pitting edema on the right lower leg. Cap refill <2sec. Sensation  intact.   Neurological: He is alert. Coordination normal.  Oriented to person and place. Disoriented to time. Able to follow one step commands. No facial droop or aphasia.  Skin: Skin is warm and dry. Capillary refill takes less than 2 seconds. He is not diaphoretic.  Psychiatric: He has a normal mood and affect. His behavior is normal.  Nursing note and vitals reviewed.    ED Treatments / Results  Labs (all labs ordered are listed, but only abnormal results are displayed) Labs Reviewed  CBC WITH DIFFERENTIAL/PLATELET - Abnormal; Notable for the following components:      Result Value   Hemoglobin 12.6 (*)    HCT 38.4 (*)    All other components within normal limits  BASIC METABOLIC PANEL - Abnormal; Notable for the following components:   Glucose, Bld 120 (*)    Calcium 8.8 (*)    All other components within normal limits  I-STAT CG4 LACTIC ACID, ED - Abnormal; Notable for the following components:   Lactic Acid, Venous 2.40 (*)    All other components within normal limits  PROTIME-INR    EKG  EKG Interpretation None       Radiology Dg Tibia/fibula Left  Result Date: 05/12/2017 CLINICAL DATA:  Unwitnessed fall in bathroom. EXAM: LEFT TIBIA AND FIBULA - 2 VIEW COMPARISON:  None FINDINGS: There is no evidence of fracture or other focal bone lesions. Soft tissues are unremarkable. IMPRESSION: Negative. Electronically Signed   By: Kerby Moors M.D.   On: 05/12/2017  15:41   Dg Ankle Complete Left  Result Date: 05/12/2017 CLINICAL DATA:  Pain after fall EXAM: LEFT ANKLE COMPLETE - 3+ VIEW COMPARISON:  None. FINDINGS: There is no evidence of fracture, dislocation, or joint effusion. There is no evidence of arthropathy or other focal bone abnormality. Soft tissues are unremarkable. IMPRESSION: Negative. Electronically Signed   By: Dorise Bullion III M.D   On: 05/12/2017 15:41   Ct Head Wo Contrast  Result Date: 05/12/2017 CLINICAL DATA:  Unwitnessed fall in bathroom with left forehead hematoma. EXAM: CT HEAD WITHOUT CONTRAST CT CERVICAL SPINE WITHOUT CONTRAST TECHNIQUE: Multidetector CT imaging of the head and cervical spine was performed following the standard protocol without intravenous contrast. Multiplanar CT image reconstructions of the cervical spine were also generated. COMPARISON:  Head CT 05/02/2017 and 07/04/2012 FINDINGS: CT HEAD FINDINGS Brain: Examination demonstrates no change in prominent ventricular system. Subtle chronic ischemic microvascular disease. No mass, mass effect, shift of midline structures or acute hemorrhage. No evidence of acute infarction. Vascular: No hyperdense vessel or unexpected calcification. Skull: Normal. Negative for fracture or focal lesion. Sinuses/Orbits: Orbits are normal. Paranasal sinuses are well developed and well aerated with mild mucosal membrane thickening over the right maxillary sinus. Other: None. CT CERVICAL SPINE FINDINGS Alignment: Normal. Skull base and vertebrae: Vertebral body heights are normal. There is moderate spondylosis of the cervical spine to include facet arthropathy and uncovertebral joint spurring. Atlantoaxial articulation is within normal. There is severe bilateral neural foraminal narrowing at multiple levels due to adjacent bony spurring. There is no acute fracture or subluxation. Soft tissues and spinal canal: No prevertebral fluid or swelling. No visible canal hematoma. Disc levels: Disc space  narrowing at the C5-6 level with near obliteration of the disc space at the C6-7 level. Upper chest: Negative. Other: None. IMPRESSION: Evidence of stable chronic communicating hydrocephalus. Mild chronic ischemic microvascular disease. No acute cervical spine injury. Moderate spondylosis of the cervical spine with significant multilevel neural foraminal narrowing and  moderate disc disease at the C6-7 level greater than the C5-6 level. Minimal chronic sinus inflammatory change involving the right maxillary sinus. Electronically Signed   By: Marin Olp M.D.   On: 05/12/2017 14:43   Ct Cervical Spine Wo Contrast  Result Date: 05/12/2017 CLINICAL DATA:  Unwitnessed fall in bathroom with left forehead hematoma. EXAM: CT HEAD WITHOUT CONTRAST CT CERVICAL SPINE WITHOUT CONTRAST TECHNIQUE: Multidetector CT imaging of the head and cervical spine was performed following the standard protocol without intravenous contrast. Multiplanar CT image reconstructions of the cervical spine were also generated. COMPARISON:  Head CT 05/02/2017 and 07/04/2012 FINDINGS: CT HEAD FINDINGS Brain: Examination demonstrates no change in prominent ventricular system. Subtle chronic ischemic microvascular disease. No mass, mass effect, shift of midline structures or acute hemorrhage. No evidence of acute infarction. Vascular: No hyperdense vessel or unexpected calcification. Skull: Normal. Negative for fracture or focal lesion. Sinuses/Orbits: Orbits are normal. Paranasal sinuses are well developed and well aerated with mild mucosal membrane thickening over the right maxillary sinus. Other: None. CT CERVICAL SPINE FINDINGS Alignment: Normal. Skull base and vertebrae: Vertebral body heights are normal. There is moderate spondylosis of the cervical spine to include facet arthropathy and uncovertebral joint spurring. Atlantoaxial articulation is within normal. There is severe bilateral neural foraminal narrowing at multiple levels due to  adjacent bony spurring. There is no acute fracture or subluxation. Soft tissues and spinal canal: No prevertebral fluid or swelling. No visible canal hematoma. Disc levels: Disc space narrowing at the C5-6 level with near obliteration of the disc space at the C6-7 level. Upper chest: Negative. Other: None. IMPRESSION: Evidence of stable chronic communicating hydrocephalus. Mild chronic ischemic microvascular disease. No acute cervical spine injury. Moderate spondylosis of the cervical spine with significant multilevel neural foraminal narrowing and moderate disc disease at the C6-7 level greater than the C5-6 level. Minimal chronic sinus inflammatory change involving the right maxillary sinus. Electronically Signed   By: Marin Olp M.D.   On: 05/12/2017 14:43    Procedures Procedures (including critical care time)  Medications Ordered in ED Medications  sodium chloride 0.9 % bolus 1,000 mL (0 mLs Intravenous Stopped 05/12/17 1925)  doxycycline (VIBRA-TABS) tablet 100 mg (100 mg Oral Given 05/12/17 1936)     Initial Impression / Assessment and Plan / ED Course  I have reviewed the triage vital signs and the nursing notes.  Pertinent labs & imaging results that were available during my care of the patient were reviewed by me and considered in my medical decision making (see chart for details).    Patient with dementia presents after an unwitnessed fall at home earlier today. According to family he is at his mental baseline. On exam he is afebrile and non-toxic appearing. No neurological deficits. His left shin is erythematous and warm with mild edema compared to right. Concern for DVT vs developing cellulitis. No concern for ischemic foot given warm with good cap refill.   CT head and cervical spine without acute abnormality. Xray of left tib/fib and left ankle negative for acute fracture or abnormality. DVT study LLE negative. BMP unremarkable. CBC unremarkable. LA mildly elevated at 2.40.     1L fluid bolus administered given patients mild lactic acidosis. Given mild erythema over the left ankle will start on doxycycline to cover potential developing cellulitis. It is also possible that pain and redness is arthritic. Have counseled family members that he can take tylenol as needed for pain.   On recheck patient is able to ambulate  independently. Counseled family at bedside on return precautions and they agree and voice understanding. BP elevated, counseled to recheck with PCP. This was a shared visit with Dr. Rogene Houston who also saw the patient and agrees with plan.    Final Clinical Impressions(s) / ED Diagnoses   Final diagnoses:  Fall, initial encounter    ED Discharge Orders        Ordered    doxycycline (VIBRAMYCIN) 100 MG capsule  2 times daily     05/12/17 1904       Bernarda Caffey 05/13/17 1226    Fredia Sorrow, MD 05/20/17 2220

## 2017-05-12 NOTE — ED Triage Notes (Signed)
Per [facility] patient from home. Family called because pt had an unwitnessed fall in the bathroom. Hematoma noted to L forehead. Per EMS, pt is at baseline. Hx of dementia. No LOC reported, nut fall was unwitnessed. No blood thinner use. Family en route.

## 2017-05-12 NOTE — Discharge Instructions (Signed)
CT head and neck were reassuring.   Blood work reassuring.   No blood clot in the leg.   Please give patient antibiotic twice a day. He got his first dose in the ER today.   Please take all of your antibiotics until finished!   You may develop abdominal discomfort or diarrhea from the antibiotic.  You may help offset this with probiotics which you can buy or get in yogurt. Do not eat  or take the probiotics until 2 hours after your antibiotic.   Return to the ER if patient has worsening redness and pain in the leg or fever >100.4 F.

## 2017-05-12 NOTE — ED Notes (Signed)
Bed: WA06 Expected date:  Expected time:  Means of arrival:  Comments: 83 yo fall, head injury, no thinners

## 2017-05-12 NOTE — Progress Notes (Signed)
LLE venous duplex prelim: negative for DVT. Delenn Ahn Eunice, RDMS, RVT  

## 2017-05-17 DIAGNOSIS — Z85038 Personal history of other malignant neoplasm of large intestine: Secondary | ICD-10-CM | POA: Diagnosis not present

## 2017-05-20 ENCOUNTER — Other Ambulatory Visit: Payer: Self-pay

## 2017-05-20 ENCOUNTER — Ambulatory Visit (INDEPENDENT_AMBULATORY_CARE_PROVIDER_SITE_OTHER): Payer: PPO | Admitting: Family Medicine

## 2017-05-20 ENCOUNTER — Encounter: Payer: Self-pay | Admitting: Family Medicine

## 2017-05-20 VITALS — BP 146/75 | HR 61 | Resp 17 | Ht 72.0 in | Wt 164.8 lb

## 2017-05-20 DIAGNOSIS — N3946 Mixed incontinence: Secondary | ICD-10-CM | POA: Diagnosis not present

## 2017-05-20 DIAGNOSIS — F0280 Dementia in other diseases classified elsewhere without behavioral disturbance: Secondary | ICD-10-CM

## 2017-05-20 DIAGNOSIS — I10 Essential (primary) hypertension: Secondary | ICD-10-CM | POA: Diagnosis not present

## 2017-05-20 DIAGNOSIS — G301 Alzheimer's disease with late onset: Secondary | ICD-10-CM | POA: Diagnosis not present

## 2017-05-20 DIAGNOSIS — Z7689 Persons encountering health services in other specified circumstances: Secondary | ICD-10-CM

## 2017-05-20 NOTE — Patient Instructions (Addendum)
   IF you received an x-ray today, you will receive an invoice from [facility]. Please contact [facility] at [phone] with questions or concerns regarding your invoice.   IF you received labwork today, you will receive an invoice from [facility]. Please contact [facility] at [phone] with questions or concerns regarding your invoice.   Our billing staff will not be able to assist you with questions regarding bills from these companies.  You will be contacted with the lab results as soon as they are available. The fastest way to get your results is to activate your My Chart account. Instructions are located on the last page of this paperwork. If you have not heard from us regarding the results in 2 weeks, please contact this office.     Alzheimer Disease Caregiver Guide Alzheimer disease is a brain disease that causes memory loss and changes in behavior. People with Alzheimer disease often have problems paying attention, communicating, and doing routine tasks. The disease gets worse over time, and people with the disease eventually need full-time care. Taking care of someone with Alzheimer disease can be challenging and overwhelming. This guide provides helpful information and tips that can make caring for someone with the disease a little easier. What changes does Alzheimer disease cause? Alzheimer disease causes a person to lose the ability to remember things and make decisions. Memory loss and confusion are usually mild at the start of the disease, but they get more severe over time. Eventually, the person may not recognize friends, family members, or familiar places. Alzheimer disease can also cause hallucinations, changes in behavior, and changes in mood, such as anxiety or anger. The changes can come on suddenly. They may happen in response to something such as:  Pain.  An infection.  Changes in environment, such as changes in temperature or  noise.  Overstimulation.  Feeling lost or scared.  Tips for managing symptoms  Be calm and patient.  Give short, simple answers to questions. Long answers can overwhelm and confuse the person.  Avoid correcting the person in a negative way.  Try not to take things personally, even if the person forgets your name. Understand that changes are a part of the disease process.  Do not argue or try to convince the person about a specific point. Doing that may make the person feel more agitated. Tips for reducing frustration  Make appointments and do daily tasks, like bathing and dressing, when the person is at his or her best.  Allow for plenty of time for simple tasks because they may take longer than expected. Take your time when doing these tasks.  Limit the person's choices. Too many choices can be overwhelming and stressful for the person.  Involve the person in what you are doing.  Keep a daily routine.  Avoid crowds and new situations, if possible.  Use simple words, short sentences, and a calm voice. Only give one direction at a time.  Buy clothes and shoes that are easy to put on and take off.  Organize medications in a pillbox for each day of the week.  Keep a calendar in a central location to remind the person of appointments or other activities.  Ask about respite care resources so that you can have a regular break from the stress of caregiving. Tips for reducing the risk of injury  Keep floors clear of clutter. Remove rugs, magazine racks, and floor lamps.  Keep hallways well-lit, especially at night.  Put a handrail and   nonslip mat in the bathtub or shower.  Put childproof locks on cabinets that contain dangerous items, such as medicines, alcohol, guns, toxic cleaning items, sharp tools or utensils, matches, and lighters.  Put locks on doors. Put the locks in places where the person cannot see or reach them easily. This will help ensure that the person does  not wander out of the house and get lost.  Be prepared for emergencies. Keep a list of emergency phone numbers and addresses in a convenient area.  Remove car keys and lock garage doors so that the person does not try to get in the car and drive.  A certain type of bracelet may be worn that tracks a person's location and identifies him or her as having memory problems. This should be worn at all times for safety. Tips for future planning  Discuss financial and legal planning early on in the course of the disease. People with Alzheimer disease will have trouble managing their money as the disease gets worse. Get help from professional advisers regarding financial and legal matters.  Discuss advance directives, safety, and daily care. Take these steps: ? Create a living will and choose a power of attorney. The person with power of attorney will be able to make decisions for the person with Alzheimer disease when he or she is no longer able to. ? Discuss driving safety and when to stop driving. The person's health care provider can help provide assistance with this decision. ? Discuss the person's living situation. If the person lives alone, make sure he or she is safe. People who live at home may need extra help from home health caregivers, and those who live in a nursing home or care center may need more care. Where to find support: One way to find support is to join a local support group. Advantages of being part of a support group include:  Learning strategies to manage stress.  Sharing experiences with others.  Receiving emotional comfort and support.  Learning about caregiving as the disease progresses.  Knowing what community resources are available and making use of them.  Where to find more information:  Alzheimer's Association: www.alz.org Contact a health care provider if:  The person has a fever.  The person has a sudden change in behavior that does not improve with  calming strategies.  The person is unable to manage in his or her current living situation.  The person threatens himself or herself, you, or anyone else.  You are no longer able to care for the person. Summary  Alzheimer disease is a brain disease that causes memory loss and changes in behavior.  People with Alzheimer disease often have problems paying attention, communicating, and doing routine tasks. The disease gets worse over time, and people with the disease eventually need full-time care.  Take steps to reduce the person's risk of injury, and plan for future care.  Taking care of someone with Alzheimer disease can be very challenging and overwhelming. One way to find support during this time is to join a local support group. This information is not intended to replace advice given to you by your health care provider. Make sure you discuss any questions you have with your health care provider. Document Released: 11/22/2003 Document Revised: 04/13/2016 Document Reviewed: 04/13/2016 Elsevier Interactive Patient Education  2018 Elsevier Inc.  

## 2017-05-20 NOTE — Progress Notes (Signed)
Chief Complaint  Patient presents with  . New Patient (Initial Visit)    establish care    HPI   Pt is a patient at the practice who has severe Alzheimer's dementia with total incontinence of bowel and bladder.  He was admitted for flu and pna 05/03/17 He was then seen in the ED for a fall on 05/12/17 Today he is here for check up   His wife reports that he completed all his meds and he seems to be back to his baseline He is currently having diarrhea with large loose stool this morning Only one episode of diarrhea but no other issues. He is here just to meet me for me to become his new provider. He has been to the practice before for urgent needs.  His wife is considering placing him in a nursing home for Alzheimer's disease.  She already had the FL2 filled out.    Past Medical History:  Diagnosis Date  . Alzheimer's dementia 2013  . Anemia    HX OF ANEMIA  . CHF (congestive heart failure) ([place])   . Colon cancer ([place])   . COPD (chronic obstructive pulmonary disease) ([place])   . Depression   . Dyspnea   . Hyperlipidemia   . Hypertension   . Pneumonia 2014  . PVD (peripheral vascular disease) ([place])   . Type II diabetes mellitus ([place])     Current Outpatient Medications  Medication Sig Dispense Refill  . albuterol (PROVENTIL) (2.5 MG/3ML) 0.083% nebulizer solution Inhale 3 mLs into the lungs every 6 (six) hours as needed for wheezing or shortness of breath.     Marland Kitchen azithromycin (ZITHROMAX) 250 MG tablet Take 1 tablet (250 mg total) by mouth daily. Continuation of hospital course 3 tablet 0  . cefUROXime (CEFTIN) 250 MG tablet Take 1 tablet (250 mg total) by mouth 2 (two) times daily with a meal. 10 tablet 0  . clopidogrel (PLAVIX) 75 MG tablet Take 1 tablet (75 mg total) by mouth daily. 30 tablet 11  . digoxin (LANOXIN) 0.125 MG tablet Take 0.125 mg by mouth.     . doxycycline (VIBRAMYCIN) 100 MG capsule Take 1 capsule (100 mg total) by mouth 2 (two) times daily. 20 capsule 0  .  famotidine (PEPCID) 20 MG tablet One at bedtime (Patient taking differently: Take 20 mg by mouth at bedtime. One at bedtime) 90 tablet 1  . fluticasone furoate-vilanterol (BREO ELLIPTA) 100-25 MCG/INH AEPB Inhale 1 puff into the lungs daily.    . furosemide (LASIX) 20 MG tablet Take 1 tablet (20 mg total) by mouth daily. 5 tablet 0  . metoprolol tartrate (LOPRESSOR) 25 MG tablet Take 0.5 tablets (12.5 mg total) by mouth 2 (two) times daily. 30 tablet 0  . oseltamivir (TAMIFLU) 30 MG capsule Take 1 capsule (30 mg total) by mouth 2 (two) times daily. Continuation of hospital course 6 capsule 0  . pantoprazole (PROTONIX) 40 MG tablet Take 1 tablet (40 mg total) by mouth daily. Take 30-60 min before first meal of the day 90 tablet 1  . pravastatin (PRAVACHOL) 40 MG tablet Take 40 mg by mouth daily after supper.     . sitaGLIPtin (JANUVIA) 100 MG tablet Take 100 mg by mouth daily.     No current facility-administered medications for this visit.     Allergies: No Known Allergies  Past Surgical History:  Procedure Laterality Date  . ABDOMINAL ANGIOGRAM N/A 04/20/2013   Procedure: ABDOMINAL ANGIOGRAM;  Surgeon: Angelia Mould, MD;  Location: [place] CATH LAB;  Service: Cardiovascular;  Laterality: N/A;  . ABDOMINAL AORTOGRAM W/LOWER EXTREMITY N/A 11/20/2016   Procedure: ABDOMINAL AORTOGRAM W/LOWER EXTREMITY;  Surgeon: Serafina Mitchell, MD;  Location: [place] CV LAB;  Service: Cardiovascular;  Laterality: N/A;  . CATARACT EXTRACTION Right 02/2013  . CATARACT EXTRACTION W/ INTRAOCULAR LENS IMPLANT Left 01/2013  . COLON RESECTION  1997   /enc. notes 05/17/2004 (04/28/2013)  . COLON SURGERY  1996   cancer  . ESOPHAGOGASTRODUODENOSCOPY N/A 09/05/2013   Procedure: ESOPHAGOGASTRODUODENOSCOPY (EGD);  Surgeon: Lear Ng, MD;  [facility] ENDOSCOPY;  Service: Endoscopy;  Laterality: N/A;  . FEMORAL ARTERY - FEMORAL ARTERY BYPASS GRAFT     right to left/enc. notes 05/17/2004 (04/28/2013)  .  FEMORAL BYPASS Right    /enc. notes 05/17/2004 (04/28/2013)  . FEMORAL-POPLITEAL BYPASS GRAFT Right 04/29/2013   Procedure:  FEMORAL-POPLITEAL ARTERY Bypass Graft with intraoperative ultrasound and arteriogarm;  Surgeon: Mal Misty, MD;  Location: [place];  Service: Vascular;  Laterality: Right;  . FIBEROPTIC BRONCHOSCOPY     Yvette Rack. notes 03/16/2005 (04/28/2013)  . INTRAOPERATIVE ARTERIOGRAM Right 04/29/2013   Procedure: INTRA OPERATIVE ARTERIOGRAM;  Surgeon: Mal Misty, MD;  Location: [place];  Service: Vascular;  Laterality: Right;  . LOWER EXTREMITY ANGIOGRAM  04/20/2013   Procedure: LOWER EXTREMITY ANGIOGRAM;  Surgeon: Angelia Mould, MD;  Location: [facility] CATH LAB;  Service: Cardiovascular;;  . LUMBAR [place] SURGERY  2005  . PERCUTANEOUS STENT INTERVENTION  04/20/2013   Procedure: PERCUTANEOUS STENT INTERVENTION;  Surgeon: Angelia Mould, MD;  Location: [facility] CATH LAB;  Service: Cardiovascular;;  right common iliac artery  . PERIPHERAL VASCULAR CATHETERIZATION N/A 01/31/2016   Procedure: Abdominal Aortogram w/Lower Extremity;  Surgeon: Serafina Mitchell, MD;  Location: [place] CV LAB;  Service: Cardiovascular;  Laterality: N/A;  . PERIPHERAL VASCULAR CATHETERIZATION Right 01/31/2016   Procedure: Peripheral Vascular Atherectomy;  Surgeon: Serafina Mitchell, MD;  Location: [place] CV LAB;  Service: Cardiovascular;  Laterality: Right;  Iliac common and external  . PERIPHERAL VASCULAR CATHETERIZATION Right 01/31/2016   Procedure: Peripheral Vascular Intervention;  Surgeon: Serafina Mitchell, MD;  Location: [place] CV LAB;  Service: Cardiovascular;  Laterality: Right;  external iliac and common iliac  . PERIPHERAL VASCULAR CATHETERIZATION N/A 04/24/2016   Procedure: Abdominal Aortogram w/Lower Extremity;  Surgeon: Serafina Mitchell, MD;  Location: [place] CV LAB;  Service: Cardiovascular;  Laterality: N/A;  . PERIPHERAL VASCULAR CATHETERIZATION Right 04/24/2016   Procedure: Peripheral Vascular  Intervention;  Surgeon: Serafina Mitchell, MD;  Location: [place] CV LAB;  Service: Cardiovascular;  Laterality: Right;  external illiac Rt  . SPINE SURGERY    . VASCULAR SURGERY      Social History   Socioeconomic History  . Marital status: Married    Spouse name: None  . Number of children: None  . Years of education: None  . Highest education level: None  Social Needs  . Financial resource strain: None  . Food insecurity - worry: None  . Food insecurity - inability: None  . Transportation needs - medical: None  . Transportation needs - non-medical: None  Occupational History  . None  Tobacco Use  . Smoking status: Former Smoker    Packs/day: 0.30    Years: 40.00    Pack years: 12.00    Types: Cigarettes    Last attempt to quit: 03/26/1993    Years since quitting: 24.1  . Smokeless tobacco: Never Used  Substance and  Sexual Activity  . Alcohol use: No  . Drug use: No  . Sexual activity: No  Other Topics Concern  . None  Social History Narrative  . None    Family History  Problem Relation Age of Onset  . Stroke Mother 42  . Hypertension Mother   . Cancer Sister   . Heart disease Sister   . Heart disease Brother        Heart Disease before age 53  . Heart attack Brother   . Cancer Sister   . Cancer Brother      ROS Pt mostly non-verbal Denies any pain   Objective: Vitals:   05/20/17 1052  BP: (!) 146/75  Pulse: 61  Resp: 17  Weight: 164 lb 12.8 oz (74.8 kg)  Height: 6' (1.829 m)    Physical Exam  Constitutional: He is oriented to person, place, and time. He appears well-developed and well-nourished.  HENT:  Head: Normocephalic and atraumatic.  Eyes: Conjunctivae and EOM are normal.  Cardiovascular: Normal rate, regular rhythm and normal heart sounds.  Pulmonary/Chest: [place]ort normal and breath sounds normal. No stridor. No respiratory distress.  Neurological: He is alert and oriented to person, place, and time.  Doses off quickly. Responds  immediately to name.     Assessment and Plan [name] was seen today for new patient (initial visit).  Diagnoses and all orders for this visit:  Essential hypertension- advised continuing current meds  Late onset Alzheimer's disease without behavioral disturbance- pt to return with urine sample -     POCT urinalysis dipstick -     POCT Microscopic Urinalysis ([facility])  Encounter to establish care with new doctor     [place]

## 2017-05-23 LAB — POCT URINALYSIS DIP (MANUAL ENTRY)
BILIRUBIN UA: NEGATIVE
BILIRUBIN UA: NEGATIVE mg/dL
Glucose, UA: NEGATIVE mg/dL
Nitrite, UA: NEGATIVE
PH UA: 5.5 (ref 5.0–8.0)
Protein Ur, POC: NEGATIVE mg/dL
Spec Grav, UA: 1.015 (ref 1.010–1.025)
Urobilinogen, UA: 0.2 E.U./dL

## 2017-05-23 LAB — POC MICROSCOPIC URINALYSIS (UMFC): MUCUS RE: ABSENT

## 2017-05-23 NOTE — Addendum Note (Signed)
Addended by: Berneice Gandy on: 05/23/2017 08:54 AM   Modules accepted: Orders

## 2017-05-25 ENCOUNTER — Other Ambulatory Visit: Payer: Self-pay | Admitting: Surgery

## 2017-06-10 ENCOUNTER — Encounter: Payer: Self-pay | Admitting: *Deleted

## 2017-06-10 ENCOUNTER — Other Ambulatory Visit: Payer: Self-pay | Admitting: *Deleted

## 2017-06-10 ENCOUNTER — Ambulatory Visit (INDEPENDENT_AMBULATORY_CARE_PROVIDER_SITE_OTHER): Payer: PPO | Admitting: Family

## 2017-06-10 ENCOUNTER — Encounter: Payer: Self-pay | Admitting: Family

## 2017-06-10 ENCOUNTER — Ambulatory Visit (INDEPENDENT_AMBULATORY_CARE_PROVIDER_SITE_OTHER)
Admission: RE | Admit: 2017-06-10 | Discharge: 2017-06-10 | Disposition: A | Discharge status: Home / Self Care | Payer: PPO | Source: Ambulatory Visit | Attending: Family | Admitting: Family

## 2017-06-10 ENCOUNTER — Ambulatory Visit (HOSPITAL_COMMUNITY)
Admission: RE | Admit: 2017-06-10 | Discharge: 2017-06-10 | Disposition: A | Discharge status: Home / Self Care | Payer: PPO | Source: Ambulatory Visit | Attending: Family | Admitting: Family

## 2017-06-10 VITALS — BP 144/75 | HR 59 | Temp 97.4°F | Resp 18 | Ht 66.0 in | Wt 165.7 lb

## 2017-06-10 DIAGNOSIS — Z87891 Personal history of nicotine dependence: Secondary | ICD-10-CM | POA: Diagnosis not present

## 2017-06-10 DIAGNOSIS — I779 Disorder of arteries and arterioles, unspecified: Secondary | ICD-10-CM | POA: Diagnosis not present

## 2017-06-10 DIAGNOSIS — Z86718 Personal history of other venous thrombosis and embolism: Secondary | ICD-10-CM | POA: Diagnosis not present

## 2017-06-10 DIAGNOSIS — Z95828 Presence of other vascular implants and grafts: Secondary | ICD-10-CM | POA: Diagnosis not present

## 2017-06-10 DIAGNOSIS — I771 Stricture of artery: Secondary | ICD-10-CM | POA: Diagnosis not present

## 2017-06-10 NOTE — Progress Notes (Signed)
[facility] [place]   CC: Follow up peripheral artery occlusive disease  History of Present Illness [name] is a 83 y.o. male who is s/p ultrasound-guided access via left brachial artery, abdominal aortogram, drug coated balloon angioplasty of right external iliac artery, drug coated balloon angioplasty of right common iliac artery on 11-20-16 by Dr. Trula Slade.  The patient was found to have elevated velocities within his right external iliac artery, the inflow source to his right to left femoral-femoral bypass graft.  Pt is also s/p right external iliac artery stent placement on 04-24-16 by Dr. Trula Slade. He also has a history of multiple lower extremity revascularization procedures including a right to left femoral-femoral bypass graft, bilateral femoral-popliteal bypass grafts, and a right common iliac stent in November 2017.  Wife states his feet stay cold for years.   Wife states that pt just does not want to walk other than for ADL's, he does not seem to elicit claudication symptoms, but wife states she is walking with him daily to get him to walk, she states he uses his walker to sit when he gets tired.   Wife states pt has Alzheimer's Disease which has worsened. She is is caregiver, she does have help in that a person comes in their home to help him dress every morning. Pt has a HH aid for bathing and dressing for 2 hours, five days/week.    Dr. Einar Gip is pt's cardiologist, wife states pt has CHF, denies any known history of MI.  Wife also denies any known history of pt having a stroke or TIA.  He has had 4 falls since October 2018.  In February 2019 he was hospitalized with pneumonia.   Pt Diabetic: "prediabetic" Pt smoker: former smoker, quit in the 1990's  Pt meds include: Statin :Yes Betablocker: Yes ASA: Yes Other anticoagulants/antiplatelets: Plavix    Past Medical History:  Diagnosis Date  . Alzheimer's dementia 2013  . Anemia    HX OF ANEMIA  . CHF (congestive heart failure) ([place])   . Colon cancer ([place])   . COPD (chronic obstructive pulmonary disease) ([place])   . Depression   . Dyspnea   . Hyperlipidemia   . Hypertension   . Pneumonia 2014  . PVD (peripheral vascular disease) ([place])   . Type II diabetes mellitus ([place])     Social History Social History   Tobacco Use  . Smoking status: Former Smoker    Packs/day: 0.30    Years: 40.00    Pack years: 12.00    Types: Cigarettes    Last attempt to quit: 03/26/1993    Years since quitting: 24.2  . Smokeless tobacco: Never Used  Substance Use Topics  . Alcohol use: No  . Drug use: No    Family History Family History  Problem Relation Age of Onset  . Stroke Mother 87  . Hypertension Mother   . Cancer Sister   . Heart disease Sister   . Heart disease Brother        Heart Disease before age 44  . Heart attack Brother   . Cancer Sister   . Cancer Brother     Past Surgical History:  Procedure Laterality Date  . ABDOMINAL ANGIOGRAM N/A 04/20/2013   Procedure: ABDOMINAL ANGIOGRAM;  Surgeon: Angelia Mould, MD;  Location: [facility] CATH LAB;  Service: Cardiovascular;  Laterality: N/A;  . ABDOMINAL AORTOGRAM W/LOWER EXTREMITY N/A 11/20/2016   Procedure: ABDOMINAL AORTOGRAM W/LOWER EXTREMITY;  Surgeon: Serafina Mitchell, MD;  Location: [place] CV LAB;  Service: Cardiovascular;  Laterality: N/A;  . CATARACT EXTRACTION Right 02/2013  . CATARACT EXTRACTION W/ INTRAOCULAR LENS IMPLANT Left 01/2013  . COLON RESECTION  1997   /enc. notes 05/17/2004 (04/28/2013)  . COLON SURGERY  1996   cancer  . ESOPHAGOGASTRODUODENOSCOPY N/A 09/05/2013   Procedure: ESOPHAGOGASTRODUODENOSCOPY (EGD);  Surgeon: Lear Ng, MD;  Location: [facility] ENDOSCOPY;  Service: Endoscopy;  Laterality: N/A;  . FEMORAL ARTERY - FEMORAL ARTERY BYPASS GRAFT     right to left/enc. notes 05/17/2004 (04/28/2013)  . FEMORAL BYPASS Right    /enc. notes 05/17/2004 (04/28/2013)  . FEMORAL-POPLITEAL  BYPASS GRAFT Right 04/29/2013   Procedure:  FEMORAL-POPLITEAL ARTERY Bypass Graft with intraoperative ultrasound and arteriogarm;  Surgeon: Mal Misty, MD;  Location: [place];  Service: Vascular;  Laterality: Right;  . FIBEROPTIC BRONCHOSCOPY     Yvette Rack. notes 03/16/2005 (04/28/2013)  . INTRAOPERATIVE ARTERIOGRAM Right 04/29/2013   Procedure: INTRA OPERATIVE ARTERIOGRAM;  Surgeon: Mal Misty, MD;  Location: [place];  Service: Vascular;  Laterality: Right;  . LOWER EXTREMITY ANGIOGRAM  04/20/2013   Procedure: LOWER EXTREMITY ANGIOGRAM;  Surgeon: Angelia Mould, MD;  Location: [facility] CATH LAB;  Service: Cardiovascular;;  . LUMBAR [place] SURGERY  2005  . PERCUTANEOUS STENT INTERVENTION  04/20/2013   Procedure: PERCUTANEOUS STENT INTERVENTION;  Surgeon: Angelia Mould, MD;  Location: [facility] CATH LAB;  Service: Cardiovascular;;  right common iliac artery  . PERIPHERAL VASCULAR CATHETERIZATION N/A 01/31/2016   Procedure: Abdominal Aortogram w/Lower Extremity;  Surgeon: Serafina Mitchell, MD;  Location: [place] CV LAB;  Service: Cardiovascular;  Laterality: N/A;  . PERIPHERAL VASCULAR CATHETERIZATION Right 01/31/2016   Procedure: Peripheral Vascular Atherectomy;  Surgeon: Serafina Mitchell, MD;  Location: [place] CV LAB;  Service: Cardiovascular;  Laterality: Right;  Iliac common and external  . PERIPHERAL VASCULAR CATHETERIZATION Right 01/31/2016   Procedure: Peripheral Vascular Intervention;  Surgeon: Serafina Mitchell, MD;  Location: [place] CV LAB;  Service: Cardiovascular;  Laterality: Right;  external iliac and common iliac  . PERIPHERAL VASCULAR CATHETERIZATION N/A 04/24/2016   Procedure: Abdominal Aortogram w/Lower Extremity;  Surgeon: Serafina Mitchell, MD;  Location: [place] CV LAB;  Service: Cardiovascular;  Laterality: N/A;  . PERIPHERAL VASCULAR CATHETERIZATION Right 04/24/2016   Procedure: Peripheral Vascular Intervention;  Surgeon: Serafina Mitchell, MD;  Location: [place] CV LAB;   Service: Cardiovascular;  Laterality: Right;  external illiac Rt  . SPINE SURGERY    . VASCULAR SURGERY      No Known Allergies  Current Outpatient Medications  Medication Sig Dispense Refill  . albuterol (PROVENTIL) (2.5 MG/3ML) 0.083% nebulizer solution Inhale 3 mLs into the lungs every 6 (six) hours as needed for wheezing or shortness of breath.     Marland Kitchen azithromycin (ZITHROMAX) 250 MG tablet Take 1 tablet (250 mg total) by mouth daily. Continuation of hospital course 3 tablet 0  . clopidogrel (PLAVIX) 75 MG tablet TAKE 1 TABLET BY MOUTH DAILY. 90 tablet 3  . digoxin (LANOXIN) 0.125 MG tablet Take 0.125 mg by mouth.     . doxycycline (VIBRAMYCIN) 100 MG capsule Take 1 capsule (100 mg total) by mouth 2 (two) times daily. 20 capsule 0  . famotidine (PEPCID) 20 MG tablet One at bedtime (Patient taking differently: Take 20 mg by mouth at bedtime. One at bedtime) 90 tablet 1  . furosemide (LASIX) 20 MG tablet Take 1 tablet (20 mg total) by mouth daily. 5 tablet 0  .  metoprolol tartrate (LOPRESSOR) 25 MG tablet Take 0.5 tablets (12.5 mg total) by mouth 2 (two) times daily. 30 tablet 0  . pravastatin (PRAVACHOL) 40 MG tablet Take 40 mg by mouth daily after supper.     . sitaGLIPtin (JANUVIA) 100 MG tablet Take 100 mg by mouth daily.     No current facility-administered medications for this visit.     ROS: See HPI for pertinent positives and negatives.   Physical Examination  Vitals:   06/10/17 1113 06/10/17 1115  BP: 133/63 (!) 144/75  Pulse: (!) 59   Resp: 18   Temp: (!) 97.4 F (36.3 C)   TempSrc: Oral   Weight: 165 lb 11.2 oz (75.2 kg)   Height: 5\' 6"  (1.676 m)    Body mass index is 26.74 kg/m.  General: A&O to person only, WDWN elderly male, asleep, readily awakens when spoken to. Gait: slow, shuffling, using walker with wife a standby assistance.   HENT: No gross abnormalities  Eyes: Pupils are equal Pulmonary: Respirations are non labored at rest, limited inspiratory  effort, limited air movement in all fields, no rales, rhonchi, or wheezing. Cardiac: regular rhythm and rate, no detected murmur.     Carotid Bruits Right Left   Negative Negative   Abdominal aortic pulse is not palpable. Radial pulses: are 2+palpable   VASCULAR EXAM: Extremitieswithout ischemic changes, without gangrene; without open wounds. No peripheral edema. Fem to fem bypass graft pulse is palpable. Mild mottling of left 2-4th toes and base of these toes.  Left lower leg and foot with shiny appearance, taught skin.      LE Pulses Right Left   FEMORAL faintlypalpable 1+palpable    POPLITEAL not palpable  not palpable   POSTERIOR TIBIAL not palpable  not palpable    DORSALIS PEDIS  ANTERIOR TIBIAL not palpable  not palpable    Abdomen: soft, NT, normal bowel sounds, no palpable masses. Skin: no rashes, no ulcers, see Extremities. Musculoskeletal: no muscle wasting or atrophy. Neurologic: A&O X 1;Responds to commands appropriately. Falls asleep quickly when not engaged in conversation.  Psychiatric: Thought content is consistent with dementia    ASSESSMENT: JERREN FLINCHBAUGH is a 83 y.o. male who presents s/p angioplasty of right external iliac artery, and angioplasty of right common iliac artery on 11-20-16.  He is also s/p right external iliac artery stent placement on 04-24-16. He also has a history of multiple lower extremity revascularization procedures including a right to left femoral-femoral bypass graft, bilateral femoral-popliteal bypass grafts, and a right common iliac stent in November 2017.  Wife states his feet have stayed cold for years, and that his dementia is worsening. He is oriented to person only.   I discussed the  occlusion of both femoropopliteal bypass grafts with Dr. Heide Scales pt's 07-09-16 visit. Pt has no symptoms of claudication and no signs of ischemia in his feet or legs. In fact, wife states pt's feet are less cold since the last procedure on 04-24-16. Pt does not ambulate much at his choice, though wife tries to get him to walk with her as much as possible. Pt also has advanced dementia.  Pt has had a silent occlusion of both femoropopliteal bypass grafts, is asymptomatic.   Wife states she has been caring for her husband for 22 years with dementia; she is having trouble caring for him as his dementia has continued to advance, and is looking into placement into an appropriate facility.   Last serum creatinine result was 0.93 on  05-12-17.  DATA  Fem-fem bypass graft duplex (06/10/17):  >70% stenosis at the proximal anastomosis (456 cm/s) Monophasic waveforms. Elevated PSV of 230 cm/s in the distal external right iliac artery suggests an inflow obstruction.   Fem-fem bypass graft duplex (02/20/17): Right to left femoral bypass graft with elevated velocities in the native inflow (297 cm/c) (was 411 cm/s on 10-10-16) and outflow artery (211 cm/c), (50-74%). This is the first post operative duplex.   Bilateral LE arterial duplex (06/10/17): Right anterior tibial artery occlusion Left posterior tibial artery occlusion  All monophasic waveforms   ABI (Date: 06/10/2017):  R:   ABI: 0.20 (was 0.45 on 10-10-16),   PT: mono  DP: absent  Pero: mono  TBI:  0.40 (was 0.45)  L:   ABI: 0.16 (was 0.44),   PT: absent  DP: mono  Pero: mono  TBI: 0.00  Decline in bilateral ABI and TBI, critical bilateral limb ischemia    PLAN:  Based on the patient's vascular studies and examination, and after discussing with Dr. Trula Slade, pt will be scheduled for aortogram, Dr. Trula Slade will access fem-fem bypass graft, and possible intervention on 06-25-17.    I discussed in depth with the patient  the nature of atherosclerosis, and emphasized the importance of maximal medical management including strict control of blood pressure, blood glucose, and lipid levels, obtaining regular exercise, and continued cessation of smoking.  The patient is aware that without maximal medical management the underlying atherosclerotic disease process will progress, limiting the benefit of any interventions.  The patient was given information about PAD including signs, symptoms, treatment, what symptoms should prompt the patient to seek immediate medical care, and risk reduction measures to take.  Clemon Chambers, RN, MSN, FNP-C [facility] Phone: [phone]  Clinic MD: Trula Slade  06/10/17 11:39 AM

## 2017-06-10 NOTE — Patient Instructions (Signed)

## 2017-06-10 NOTE — H&P (View-Only) (Signed)
[facility] [place]   CC: Follow up peripheral artery occlusive disease  History of Present Illness [name] is a 83 y.o. male who is s/p ultrasound-guided access via left brachial artery, abdominal aortogram, drug coated balloon angioplasty of right external iliac artery, drug coated balloon angioplasty of right common iliac artery on 11-20-16 by Dr. Trula Slade.  The patient was found to have elevated velocities within his right external iliac artery, the inflow source to his right to left femoral-femoral bypass graft.  Pt is also s/p right external iliac artery stent placement on 04-24-16 by Dr. Trula Slade. He also has a history of multiple lower extremity revascularization procedures including a right to left femoral-femoral bypass graft, bilateral femoral-popliteal bypass grafts, and a right common iliac stent in November 2017.  Wife states his feet stay cold for years.   Wife states that pt just does not want to walk other than for ADL's, he does not seem to elicit claudication symptoms, but wife states she is walking with him daily to get him to walk, she states he uses his walker to sit when he gets tired.   Wife states pt has Alzheimer's Disease which has worsened. She is is caregiver, she does have help in that a person comes in their home to help him dress every morning. Pt has a HH aid for bathing and dressing for 2 hours, five days/week.    Dr. Einar Gip is pt's cardiologist, wife states pt has CHF, denies any known history of MI.  Wife also denies any known history of pt having a stroke or TIA.  He has had 4 falls since October 2018.  In February 2019 he was hospitalized with pneumonia.   Pt Diabetic: "prediabetic" Pt smoker: former smoker, quit in the 1990's  Pt meds include: Statin :Yes Betablocker: Yes ASA: Yes Other anticoagulants/antiplatelets: Plavix    Past Medical History:  Diagnosis Date  . Alzheimer's dementia 2013  . Anemia    HX OF ANEMIA  . CHF (congestive heart failure) ([place])   . Colon cancer ([place])   . COPD (chronic obstructive pulmonary disease) ([place])   . Depression   . Dyspnea   . Hyperlipidemia   . Hypertension   . Pneumonia 2014  . PVD (peripheral vascular disease) ([place])   . Type II diabetes mellitus ([place])     Social History Social History   Tobacco Use  . Smoking status: Former Smoker    Packs/day: 0.30    Years: 40.00    Pack years: 12.00    Types: Cigarettes    Last attempt to quit: 03/26/1993    Years since quitting: 24.2  . Smokeless tobacco: Never Used  Substance Use Topics  . Alcohol use: No  . Drug use: No    Family History Family History  Problem Relation Age of Onset  . Stroke Mother 87  . Hypertension Mother   . Cancer Sister   . Heart disease Sister   . Heart disease Brother        Heart Disease before age 16  . Heart attack Brother   . Cancer Sister   . Cancer Brother     Past Surgical History:  Procedure Laterality Date  . ABDOMINAL ANGIOGRAM N/A 04/20/2013   Procedure: ABDOMINAL ANGIOGRAM;  Surgeon: Angelia Mould, MD;  Location: [facility] CATH LAB;  Service: Cardiovascular;  Laterality: N/A;  . ABDOMINAL AORTOGRAM W/LOWER EXTREMITY N/A 11/20/2016   Procedure: ABDOMINAL AORTOGRAM W/LOWER EXTREMITY;  Surgeon: Serafina Mitchell, MD;  Location: [facility] CV LAB;  Service: Cardiovascular;  Laterality: N/A;  . CATARACT EXTRACTION Right 02/2013  . CATARACT EXTRACTION W/ INTRAOCULAR LENS IMPLANT Left 01/2013  . COLON RESECTION  1997   /enc. notes 05/17/2004 (04/28/2013)  . COLON SURGERY  1996   cancer  . ESOPHAGOGASTRODUODENOSCOPY N/A 09/05/2013   Procedure: ESOPHAGOGASTRODUODENOSCOPY (EGD);  Surgeon: Lear Ng, MD;  Location: [facility] ENDOSCOPY;  Service: Endoscopy;  Laterality: N/A;  . FEMORAL ARTERY - FEMORAL ARTERY BYPASS GRAFT     right to left/enc. notes 05/17/2004 (04/28/2013)  . FEMORAL BYPASS Right    /enc. notes 05/17/2004 (04/28/2013)  . FEMORAL-POPLITEAL  BYPASS GRAFT Right 04/29/2013   Procedure:  FEMORAL-POPLITEAL ARTERY Bypass Graft with intraoperative ultrasound and arteriogarm;  Surgeon: Mal Misty, MD;  Location: [facility];  Service: Vascular;  Laterality: Right;  . FIBEROPTIC BRONCHOSCOPY     Yvette Rack. notes 03/16/2005 (04/28/2013)  . INTRAOPERATIVE ARTERIOGRAM Right 04/29/2013   Procedure: INTRA OPERATIVE ARTERIOGRAM;  Surgeon: Mal Misty, MD;  Location: [place];  Service: Vascular;  Laterality: Right;  . LOWER EXTREMITY ANGIOGRAM  04/20/2013   Procedure: LOWER EXTREMITY ANGIOGRAM;  Surgeon: Angelia Mould, MD;  Location: [facility] CATH LAB;  Service: Cardiovascular;;  . LUMBAR [place] SURGERY  2005  . PERCUTANEOUS STENT INTERVENTION  04/20/2013   Procedure: PERCUTANEOUS STENT INTERVENTION;  Surgeon: Angelia Mould, MD;  Location: [facility] CATH LAB;  Service: Cardiovascular;;  right common iliac artery  . PERIPHERAL VASCULAR CATHETERIZATION N/A 01/31/2016   Procedure: Abdominal Aortogram w/Lower Extremity;  Surgeon: Serafina Mitchell, MD;  Location: [place] CV LAB;  Service: Cardiovascular;  Laterality: N/A;  . PERIPHERAL VASCULAR CATHETERIZATION Right 01/31/2016   Procedure: Peripheral Vascular Atherectomy;  Surgeon: Serafina Mitchell, MD;  Location: [place] CV LAB;  Service: Cardiovascular;  Laterality: Right;  Iliac common and external  . PERIPHERAL VASCULAR CATHETERIZATION Right 01/31/2016   Procedure: Peripheral Vascular Intervention;  Surgeon: Serafina Mitchell, MD;  Location: [place] CV LAB;  Service: Cardiovascular;  Laterality: Right;  external iliac and common iliac  . PERIPHERAL VASCULAR CATHETERIZATION N/A 04/24/2016   Procedure: Abdominal Aortogram w/Lower Extremity;  Surgeon: Serafina Mitchell, MD;  Location: [place] CV LAB;  Service: Cardiovascular;  Laterality: N/A;  . PERIPHERAL VASCULAR CATHETERIZATION Right 04/24/2016   Procedure: Peripheral Vascular Intervention;  Surgeon: Serafina Mitchell, MD;  Location: [place] CV LAB;   Service: Cardiovascular;  Laterality: Right;  external illiac Rt  . SPINE SURGERY    . VASCULAR SURGERY      No Known Allergies  Current Outpatient Medications  Medication Sig Dispense Refill  . albuterol (PROVENTIL) (2.5 MG/3ML) 0.083% nebulizer solution Inhale 3 mLs into the lungs every 6 (six) hours as needed for wheezing or shortness of breath.     Marland Kitchen azithromycin (ZITHROMAX) 250 MG tablet Take 1 tablet (250 mg total) by mouth daily. Continuation of hospital course 3 tablet 0  . clopidogrel (PLAVIX) 75 MG tablet TAKE 1 TABLET BY MOUTH DAILY. 90 tablet 3  . digoxin (LANOXIN) 0.125 MG tablet Take 0.125 mg by mouth.     . doxycycline (VIBRAMYCIN) 100 MG capsule Take 1 capsule (100 mg total) by mouth 2 (two) times daily. 20 capsule 0  . famotidine (PEPCID) 20 MG tablet One at bedtime (Patient taking differently: Take 20 mg by mouth at bedtime. One at bedtime) 90 tablet 1  . furosemide (LASIX) 20 MG tablet Take 1 tablet (20 mg total) by mouth daily. 5 tablet 0  .  metoprolol tartrate (LOPRESSOR) 25 MG tablet Take 0.5 tablets (12.5 mg total) by mouth 2 (two) times daily. 30 tablet 0  . pravastatin (PRAVACHOL) 40 MG tablet Take 40 mg by mouth daily after supper.     . sitaGLIPtin (JANUVIA) 100 MG tablet Take 100 mg by mouth daily.     No current facility-administered medications for this visit.     ROS: See HPI for pertinent positives and negatives.   Physical Examination  Vitals:   06/10/17 1113 06/10/17 1115  BP: 133/63 (!) 144/75  Pulse: (!) 59   Resp: 18   Temp: (!) 97.4 F (36.3 C)   TempSrc: Oral   Weight: 165 lb 11.2 oz (75.2 kg)   Height: 5\' 6"  (1.676 m)    Body mass index is 26.74 kg/m.  General: A&O to person only, WDWN elderly male, asleep, readily awakens when spoken to. Gait: slow, shuffling, using walker with wife a standby assistance.   HENT: No gross abnormalities  Eyes: Pupils are equal Pulmonary: Respirations are non labored at rest, limited inspiratory  effort, limited air movement in all fields, no rales, rhonchi, or wheezing. Cardiac: regular rhythm and rate, no detected murmur.     Carotid Bruits Right Left   Negative Negative   Abdominal aortic pulse is not palpable. Radial pulses: are 2+palpable   VASCULAR EXAM: Extremitieswithout ischemic changes, without gangrene; without open wounds. No peripheral edema. Fem to fem bypass graft pulse is palpable. Mild mottling of left 2-4th toes and base of these toes.  Left lower leg and foot with shiny appearance, taught skin.      LE Pulses Right Left   FEMORAL faintlypalpable 1+palpable    POPLITEAL not palpable  not palpable   POSTERIOR TIBIAL not palpable  not palpable    DORSALIS PEDIS  ANTERIOR TIBIAL not palpable  not palpable    Abdomen: soft, NT, normal bowel sounds, no palpable masses. Skin: no rashes, no ulcers, see Extremities. Musculoskeletal: no muscle wasting or atrophy. Neurologic: A&O X 1;Responds to commands appropriately. Falls asleep quickly when not engaged in conversation.  Psychiatric: Thought content is consistent with dementia    ASSESSMENT: SAYLOR SHECKLER is a 83 y.o. male who presents s/p angioplasty of right external iliac artery, and angioplasty of right common iliac artery on 11-20-16.  He is also s/p right external iliac artery stent placement on 04-24-16. He also has a history of multiple lower extremity revascularization procedures including a right to left femoral-femoral bypass graft, bilateral femoral-popliteal bypass grafts, and a right common iliac stent in November 2017.  Wife states his feet have stayed cold for years, and that his dementia is worsening. He is oriented to person only.   I discussed the  occlusion of both femoropopliteal bypass grafts with Dr. Heide Scales pt's 07-09-16 visit. Pt has no symptoms of claudication and no signs of ischemia in his feet or legs. In fact, wife states pt's feet are less cold since the last procedure on 04-24-16. Pt does not ambulate much at his choice, though wife tries to get him to walk with her as much as possible. Pt also has advanced dementia.  Pt has had a silent occlusion of both femoropopliteal bypass grafts, is asymptomatic.   Wife states she has been caring for her husband for 22 years with dementia; she is having trouble caring for him as his dementia has continued to advance, and is looking into placement into an appropriate facility.   Last serum creatinine result was 0.93 on  05-12-17.  DATA  Fem-fem bypass graft duplex (06/10/17):  >70% stenosis at the proximal anastomosis (456 cm/s) Monophasic waveforms. Elevated PSV of 230 cm/s in the distal external right iliac artery suggests an inflow obstruction.   Fem-fem bypass graft duplex (02/20/17): Right to left femoral bypass graft with elevated velocities in the native inflow (297 cm/c) (was 411 cm/s on 10-10-16) and outflow artery (211 cm/c), (50-74%). This is the first post operative duplex.   Bilateral LE arterial duplex (06/10/17): Right anterior tibial artery occlusion Left posterior tibial artery occlusion  All monophasic waveforms   ABI (Date: 06/10/2017):  R:   ABI: 0.20 (was 0.45 on 10-10-16),   PT: mono  DP: absent  Pero: mono  TBI:  0.40 (was 0.45)  L:   ABI: 0.16 (was 0.44),   PT: absent  DP: mono  Pero: mono  TBI: 0.00  Decline in bilateral ABI and TBI, critical bilateral limb ischemia    PLAN:  Based on the patient's vascular studies and examination, and after discussing with Dr. Trula Slade, pt will be scheduled for aortogram, Dr. Trula Slade will access fem-fem bypass graft, and possible intervention on 06-25-17.    I discussed in depth with the patient  the nature of atherosclerosis, and emphasized the importance of maximal medical management including strict control of blood pressure, blood glucose, and lipid levels, obtaining regular exercise, and continued cessation of smoking.  The patient is aware that without maximal medical management the underlying atherosclerotic disease process will progress, limiting the benefit of any interventions.  The patient was given information about PAD including signs, symptoms, treatment, what symptoms should prompt the patient to seek immediate medical care, and risk reduction measures to take.  Clemon Chambers, RN, MSN, FNP-C [facility] Phone: [phone]  Clinic MD: Trula Slade  06/10/17 11:39 AM

## 2017-06-25 ENCOUNTER — Inpatient Hospital Stay (HOSPITAL_COMMUNITY)
Admission: AD | Disposition: A | Discharge status: Home Health | Payer: Self-pay | Source: Ambulatory Visit | Attending: Surgery

## 2017-06-25 ENCOUNTER — Inpatient Hospital Stay (HOSPITAL_COMMUNITY)
Admission: AD | Admit: 2017-06-25 | Discharge: 2017-06-28 | DRG: 315 | Disposition: A | Discharge status: Home Health | Payer: PPO | Source: Ambulatory Visit | Attending: Surgery | Admitting: Surgery

## 2017-06-25 DIAGNOSIS — E1151 Type 2 diabetes mellitus with diabetic peripheral angiopathy without gangrene: Secondary | ICD-10-CM | POA: Diagnosis not present

## 2017-06-25 DIAGNOSIS — Z955 Presence of coronary angioplasty implant and graft: Secondary | ICD-10-CM | POA: Diagnosis not present

## 2017-06-25 DIAGNOSIS — I708 Atherosclerosis of other arteries: Secondary | ICD-10-CM | POA: Diagnosis present

## 2017-06-25 DIAGNOSIS — G309 Alzheimer's disease, unspecified: Secondary | ICD-10-CM | POA: Diagnosis not present

## 2017-06-25 DIAGNOSIS — E785 Hyperlipidemia, unspecified: Secondary | ICD-10-CM | POA: Diagnosis present

## 2017-06-25 DIAGNOSIS — I110 Hypertensive heart disease with heart failure: Secondary | ICD-10-CM | POA: Diagnosis present

## 2017-06-25 DIAGNOSIS — T82858A Stenosis of vascular prosthetic devices, implants and grafts, initial encounter: Principal | ICD-10-CM | POA: Diagnosis present

## 2017-06-25 DIAGNOSIS — F0280 Dementia in other diseases classified elsewhere without behavioral disturbance: Secondary | ICD-10-CM | POA: Diagnosis present

## 2017-06-25 DIAGNOSIS — Z961 Presence of intraocular lens: Secondary | ICD-10-CM | POA: Diagnosis not present

## 2017-06-25 DIAGNOSIS — Y832 Surgical operation with anastomosis, bypass or graft as the cause of abnormal reaction of the patient, or of later complication, without mention of misadventure at the time of the procedure: Secondary | ICD-10-CM | POA: Diagnosis present

## 2017-06-25 DIAGNOSIS — Z79899 Other long term (current) drug therapy: Secondary | ICD-10-CM

## 2017-06-25 DIAGNOSIS — Z7902 Long term (current) use of antithrombotics/antiplatelets: Secondary | ICD-10-CM

## 2017-06-25 DIAGNOSIS — F329 Major depressive disorder, single episode, unspecified: Secondary | ICD-10-CM | POA: Diagnosis present

## 2017-06-25 DIAGNOSIS — Z7984 Long term (current) use of oral hypoglycemic drugs: Secondary | ICD-10-CM

## 2017-06-25 DIAGNOSIS — Z87891 Personal history of nicotine dependence: Secondary | ICD-10-CM | POA: Diagnosis not present

## 2017-06-25 DIAGNOSIS — Z85038 Personal history of other malignant neoplasm of large intestine: Secondary | ICD-10-CM | POA: Diagnosis not present

## 2017-06-25 DIAGNOSIS — Z8249 Family history of ischemic heart disease and other diseases of the circulatory system: Secondary | ICD-10-CM | POA: Diagnosis not present

## 2017-06-25 DIAGNOSIS — I5022 Chronic systolic (congestive) heart failure: Secondary | ICD-10-CM | POA: Diagnosis not present

## 2017-06-25 DIAGNOSIS — I779 Disorder of arteries and arterioles, unspecified: Secondary | ICD-10-CM | POA: Diagnosis not present

## 2017-06-25 DIAGNOSIS — I739 Peripheral vascular disease, unspecified: Secondary | ICD-10-CM | POA: Diagnosis present

## 2017-06-25 DIAGNOSIS — J449 Chronic obstructive pulmonary disease, unspecified: Secondary | ICD-10-CM | POA: Diagnosis present

## 2017-06-25 HISTORY — PX: LOWER EXTREMITY ANGIOGRAPHY: CATH118251

## 2017-06-25 LAB — POCT I-STAT, CHEM 8
BUN: 12 mg/dL (ref 6–20)
CALCIUM ION: 1.15 mmol/L (ref 1.15–1.40)
CREATININE: 1.1 mg/dL (ref 0.61–1.24)
Chloride: 104 mmol/L (ref 101–111)
GLUCOSE: 126 mg/dL — AB (ref 65–99)
HCT: 37 % — ABNORMAL LOW (ref 39.0–52.0)
Hemoglobin: 12.6 g/dL — ABNORMAL LOW (ref 13.0–17.0)
Potassium: 3.9 mmol/L (ref 3.5–5.1)
Sodium: 140 mmol/L (ref 135–145)
TCO2: 26 mmol/L (ref 22–32)

## 2017-06-25 LAB — CBC
HEMATOCRIT: 37.7 % — AB (ref 39.0–52.0)
Hemoglobin: 11.8 g/dL — ABNORMAL LOW (ref 13.0–17.0)
MCH: 27.4 pg (ref 26.0–34.0)
MCHC: 31.3 g/dL (ref 30.0–36.0)
MCV: 87.7 fL (ref 78.0–100.0)
Platelets: 243 10*3/uL (ref 150–400)
RBC: 4.3 MIL/uL (ref 4.22–5.81)
RDW: 14.7 % (ref 11.5–15.5)
WBC: 8.2 10*3/uL (ref 4.0–10.5)

## 2017-06-25 LAB — GLUCOSE, CAPILLARY
Glucose-Capillary: 121 mg/dL — ABNORMAL HIGH (ref 65–99)
Glucose-Capillary: 117 mg/dL — ABNORMAL HIGH (ref 65–99)
Glucose-Capillary: 91 mg/dL (ref 65–99)

## 2017-06-25 LAB — CREATININE, SERUM
Creatinine, Ser: 0.99 mg/dL (ref 0.61–1.24)
GFR calc Af Amer: >60 mL/min (ref >60)
GFR calc non Af Amer: >60 mL/min (ref >60)

## 2017-06-25 SURGERY — LOWER EXTREMITY ANGIOGRAPHY
Anesthesia: LOCAL | Laterality: Bilateral

## 2017-06-25 MED ORDER — IOPAMIDOL (ISOVUE-370) INJECTION 76%
INTRAVENOUS | Status: DC | PRN
  Administered 2017-06-25: 50 mL via INTRAVENOUS

## 2017-06-25 MED ORDER — SODIUM CHLORIDE 0.9 % WEIGHT BASED INFUSION: 1.0000 mL/kg/h | INTRAVENOUS | Status: AC

## 2017-06-25 MED ORDER — LIDOCAINE HCL 1 % IJ SOLN
INTRAMUSCULAR | Status: AC
  Filled 2017-06-25: qty 20

## 2017-06-25 MED ORDER — HEPARIN (PORCINE) IN NACL 2-0.9 UNIT/ML-% IJ SOLN
INTRAMUSCULAR | Status: AC
  Filled 2017-06-25: qty 1000

## 2017-06-25 MED ORDER — ONDANSETRON HCL 4 MG/2ML IJ SOLN
4.0000 mg | Freq: Four times a day (QID) | INTRAMUSCULAR | Status: DC | PRN
Start: 2017-06-25 — End: 2017-06-28

## 2017-06-25 MED ORDER — MORPHINE SULFATE (PF) 2 MG/ML IV SOLN: 1.0000 mg | INTRAVENOUS | Status: DC | PRN

## 2017-06-25 MED ORDER — ACETAMINOPHEN 325 MG PO TABS
ORAL_TABLET | ORAL | Status: AC
  Filled 2017-06-25: qty 2

## 2017-06-25 MED ORDER — PENTOXIFYLLINE ER 400 MG PO TBCR: 400.0000 mg | EXTENDED_RELEASE_TABLET | Freq: Three times a day (TID) | ORAL | 6 refills | Status: DC

## 2017-06-25 MED ORDER — LABETALOL HCL 5 MG/ML IV SOLN
10.0000 mg | INTRAVENOUS | Status: AC | PRN
  Administered 2017-06-25 – 2017-06-28 (×4): 10 mg via INTRAVENOUS
  Filled 2017-06-25 (×3): qty 4

## 2017-06-25 MED ORDER — LINAGLIPTIN 5 MG PO TABS
5.0000 mg | ORAL_TABLET | Freq: Every day | ORAL | Status: DC
  Administered 2017-06-26 – 2017-06-28 (×3): 5 mg via ORAL
  Filled 2017-06-25 (×3): qty 1

## 2017-06-25 MED ORDER — DIGOXIN 125 MCG PO TABS
0.1250 mg | ORAL_TABLET | Freq: Every day | ORAL | Status: DC
  Administered 2017-06-26 – 2017-06-28 (×3): 0.125 mg via ORAL
  Filled 2017-06-25 (×3): qty 1

## 2017-06-25 MED ORDER — FUROSEMIDE 20 MG PO TABS
20.0000 mg | ORAL_TABLET | Freq: Every day | ORAL | Status: DC
  Administered 2017-06-25 – 2017-06-28 (×4): 20 mg via ORAL
  Filled 2017-06-25 (×4): qty 1

## 2017-06-25 MED ORDER — HYDRALAZINE HCL 20 MG/ML IJ SOLN
INTRAMUSCULAR | Status: AC
Start: 2017-06-25 — End: 2017-06-25
  Filled 2017-06-25: qty 1

## 2017-06-25 MED ORDER — CLOPIDOGREL BISULFATE 75 MG PO TABS
75.0000 mg | ORAL_TABLET | Freq: Every day | ORAL | Status: DC
  Administered 2017-06-25 – 2017-06-28 (×4): 75 mg via ORAL
  Filled 2017-06-25 (×4): qty 1

## 2017-06-25 MED ORDER — FAMOTIDINE 20 MG PO TABS: 20.0000 mg | ORAL_TABLET | Freq: Every day | ORAL | Status: DC

## 2017-06-25 MED ORDER — ALUM & MAG HYDROXIDE-SIMETH 200-200-20 MG/5ML PO SUSP: 15.0000 mL | ORAL | Status: DC | PRN

## 2017-06-25 MED ORDER — GUAIFENESIN-DM 100-10 MG/5ML PO SYRP: 15.0000 mL | ORAL_SOLUTION | ORAL | Status: DC | PRN

## 2017-06-25 MED ORDER — ACETAMINOPHEN 325 MG PO TABS
650.0000 mg | ORAL_TABLET | ORAL | Status: DC | PRN
  Administered 2017-06-25 – 2017-06-27 (×3): 650 mg via ORAL
  Filled 2017-06-25 (×2): qty 2

## 2017-06-25 MED ORDER — DOCUSATE SODIUM 100 MG PO CAPS
100.0000 mg | ORAL_CAPSULE | Freq: Two times a day (BID) | ORAL | Status: DC
  Administered 2017-06-25 – 2017-06-27 (×4): 100 mg via ORAL
  Filled 2017-06-25 (×4): qty 1

## 2017-06-25 MED ORDER — ALBUTEROL SULFATE (2.5 MG/3ML) 0.083% IN NEBU: 2.5000 mg | INHALATION_SOLUTION | Freq: Four times a day (QID) | RESPIRATORY_TRACT | Status: DC | PRN

## 2017-06-25 MED ORDER — METOPROLOL TARTRATE 12.5 MG HALF TABLET
12.5000 mg | ORAL_TABLET | Freq: Two times a day (BID) | ORAL | Status: DC
  Administered 2017-06-25 – 2017-06-28 (×6): 12.5 mg via ORAL
  Filled 2017-06-25 (×6): qty 1

## 2017-06-25 MED ORDER — BISACODYL 5 MG PO TBEC: 5.0000 mg | DELAYED_RELEASE_TABLET | Freq: Every day | ORAL | Status: DC | PRN

## 2017-06-25 MED ORDER — POTASSIUM CHLORIDE CRYS ER 20 MEQ PO TBCR
20.0000 meq | EXTENDED_RELEASE_TABLET | Freq: Once | ORAL | Status: DC
  Filled 2017-06-25: qty 1

## 2017-06-25 MED ORDER — OXYCODONE HCL 5 MG PO TABS: 5.0000 mg | ORAL_TABLET | ORAL | Status: DC | PRN

## 2017-06-25 MED ORDER — SODIUM CHLORIDE 0.9% FLUSH
3.0000 mL | Freq: Two times a day (BID) | INTRAVENOUS | Status: DC
  Administered 2017-06-26 – 2017-06-28 (×5): 3 mL via INTRAVENOUS

## 2017-06-25 MED ORDER — SENNOSIDES-DOCUSATE SODIUM 8.6-50 MG PO TABS: 1.0000 | ORAL_TABLET | Freq: Every evening | ORAL | Status: DC | PRN

## 2017-06-25 MED ORDER — HYDRALAZINE HCL 20 MG/ML IJ SOLN
5.0000 mg | INTRAMUSCULAR | Status: AC | PRN
  Administered 2017-06-26 – 2017-06-27 (×2): 5 mg via INTRAVENOUS
  Filled 2017-06-25 (×2): qty 1

## 2017-06-25 MED ORDER — SODIUM CHLORIDE 0.9 % IV SOLN: 250.0000 mL | INTRAVENOUS | Status: DC | PRN

## 2017-06-25 MED ORDER — LIDOCAINE HCL (PF) 1 % IJ SOLN
INTRAMUSCULAR | Status: DC | PRN
  Administered 2017-06-25: 15 mL

## 2017-06-25 MED ORDER — HEPARIN (PORCINE) IN NACL 2-0.9 UNIT/ML-% IJ SOLN
INTRAMUSCULAR | Status: AC | PRN
  Administered 2017-06-25 (×2): 500 mL

## 2017-06-25 MED ORDER — HYDRALAZINE HCL 20 MG/ML IJ SOLN
5.0000 mg | INTRAMUSCULAR | Status: AC | PRN
  Administered 2017-06-25 (×2): 5 mg via INTRAVENOUS

## 2017-06-25 MED ORDER — ENOXAPARIN SODIUM 30 MG/0.3ML ~~LOC~~ SOLN
30.0000 mg | SUBCUTANEOUS | Status: DC
  Administered 2017-06-26 – 2017-06-28 (×3): 30 mg via SUBCUTANEOUS
  Filled 2017-06-25 (×3): qty 0.3

## 2017-06-25 MED ORDER — PANTOPRAZOLE SODIUM 40 MG PO TBEC: 40.0000 mg | DELAYED_RELEASE_TABLET | Freq: Every day | ORAL | Status: DC

## 2017-06-25 MED ORDER — SODIUM CHLORIDE 0.9% FLUSH
3.0000 mL | Freq: Two times a day (BID) | INTRAVENOUS | Status: DC
  Administered 2017-06-25: 3 mL via INTRAVENOUS

## 2017-06-25 MED ORDER — SODIUM CHLORIDE 0.9% FLUSH: 3.0000 mL | INTRAVENOUS | Status: DC | PRN

## 2017-06-25 MED ORDER — PANTOPRAZOLE SODIUM 40 MG PO TBEC
40.0000 mg | DELAYED_RELEASE_TABLET | Freq: Every day | ORAL | Status: DC
  Administered 2017-06-25 – 2017-06-28 (×4): 40 mg via ORAL
  Filled 2017-06-25 (×4): qty 1

## 2017-06-25 MED ORDER — PHENOL 1.4 % MT LIQD: 1.0000 | OROMUCOSAL | Status: DC | PRN

## 2017-06-25 MED ORDER — SODIUM CHLORIDE 0.9 % IV SOLN
INTRAVENOUS | Status: DC
  Administered 2017-06-25: 08:00:00 via INTRAVENOUS

## 2017-06-25 MED ORDER — FLUTICASONE FUROATE-VILANTEROL 100-25 MCG/INH IN AEPB
1.0000 | INHALATION_SPRAY | Freq: Every day | RESPIRATORY_TRACT | Status: DC
  Administered 2017-06-26 – 2017-06-28 (×3): 1 via RESPIRATORY_TRACT
  Filled 2017-06-25: qty 28

## 2017-06-25 SURGICAL SUPPLY — 13 items
CATH ANGIO 5F BER2 65CM (CATHETERS) ×2 IMPLANT
CATH OMNI FLUSH 5F 65CM (CATHETERS) ×2 IMPLANT
COVER PRB 48X5XTLSCP FOLD TPE (BAG) ×1 IMPLANT
COVER PROBE 5X48 (BAG) ×1
KIT MICROPUNCTURE NIT STIFF (SHEATH) ×2 IMPLANT
KIT PV (KITS) ×2 IMPLANT
SHEATH AVANTI 11CM 5FR (SHEATH) ×2 IMPLANT
STOPCOCK MORSE 400PSI 3WAY (MISCELLANEOUS) ×2 IMPLANT
SYR MEDRAD MARK V 150ML (SYRINGE) ×2 IMPLANT
TRANSDUCER W/STOPCOCK (MISCELLANEOUS) ×2 IMPLANT
TRAY PV CATH (CUSTOM PROCEDURE TRAY) ×2 IMPLANT
TUBING CIL FLEX 10 FLL-RA (TUBING) ×2 IMPLANT
WIRE BENTSON .035X145CM (WIRE) ×2 IMPLANT

## 2017-06-25 NOTE — Progress Notes (Addendum)
Site area: left fem pop graft Site Prior to Removal:  Level 0 Pressure Applied For:30 min Manual:   yes Patient Status During Pull:   Post Pull Site:  Level Post Pull Instructions Given: yes  Post Pull Pulses Present:  Dressing Applied:  clear Bedrest begins @ 1045 till 1445 Comments:

## 2017-06-25 NOTE — Discharge Instructions (Signed)
**Note -identified via Obfuscation** Femoral Site Care °Refer to this sheet in the next few weeks. These instructions provide you with information about caring for yourself after your procedure. Your health care provider may also give you more specific instructions. Your treatment has been planned according to current medical practices, but problems sometimes occur. Call your health care provider if you have any problems or questions after your procedure. °What can I expect after the procedure? °After your procedure, it is typical to have the following: °· Bruising at the site that usually fades within 1-2 weeks. °· Blood collecting in the tissue (hematoma) that may be painful to the touch. It should usually decrease in size and tenderness within 1-2 weeks. ° °Follow these instructions at home: °· Take medicines only as directed by your health care provider. °· You may shower 24-48 hours after the procedure or as directed by your health care provider. Remove the bandage (dressing) and gently wash the site with plain soap and water. Pat the area dry with a clean towel. Do not rub the site, because this may cause bleeding. °· Do not take baths, swim, or use a hot tub until your health care provider approves. °· Check your insertion site every day for redness, swelling, or drainage. °· Do not apply powder or lotion to the site. °· Limit use of stairs to twice a day for the first 2-3 days or as directed by your health care provider. °· Do not squat for the first 2-3 days or as directed by your health care provider. °· Do not lift over 10 lb (4.5 kg) for 5 days after your procedure or as directed by your health care provider. °· Ask your health care provider when it is okay to: °? Return to work or school. °? Resume usual physical activities or sports. °? Resume sexual activity. °· Do not drive home if you are discharged the same day as the procedure. Have someone else drive you. °· You may drive 24 hours after the procedure unless otherwise instructed by  your health care provider. °· Do not operate machinery or power tools for 24 hours after the procedure or as directed by your health care provider. °· If your procedure was done as an outpatient procedure, which means that you went home the same day as your procedure, a responsible adult should be with you for the first 24 hours after you arrive home. °· Keep all follow-up visits as directed by your health care provider. This is important. °Contact a health care provider if: °· You have a fever. °· You have chills. °· You have increased bleeding from the site. Hold pressure on the site. °Get help right away if: °· You have unusual pain at the site. °· You have redness, warmth, or swelling at the site. °· You have drainage (other than a small amount of blood on the dressing) from the site. °· The site is bleeding, and the bleeding does not stop after 30 minutes of holding steady pressure on the site. °· Your leg or foot becomes pale, cool, tingly, or numb. °This information is not intended to replace advice given to you by your health care provider. Make sure you discuss any questions you have with your health care provider. °Document Released: 11/13/2013 Document Revised: 08/18/2015 Document Reviewed: 09/29/2013 °Elsevier Interactive Patient Education © 2018 [employer] Inc[employer] ° °

## 2017-06-25 NOTE — Interval H&P Note (Signed)
History and Physical Interval Note:  06/25/2017 8:31 AM  [name]  has presented today for surgery, with the diagnosis of pad  The various methods of treatment have been discussed with the patient and family. After consideration of risks, benefits and other options for treatment, the patient has consented to  Procedure(s): LOWER EXTREMITY ANGIOGRAPHY (N/A) as a surgical intervention .  The patient's history has been reviewed, patient examined, no change in status, stable for surgery.  I have reviewed the patient's chart and labs.  Questions were answered to the patient's satisfaction.     [name]

## 2017-06-25 NOTE — Progress Notes (Signed)
Pt changed for large amounts of stool.  BP is down to 142/67.  Report called to [place] on 4 E.  Pt transferred with family at bedside.  Angiogram site WNL

## 2017-06-25 NOTE — Discharge Planning (Signed)
Dallana Mavity J. Clydene Laming, RN, BSN, [employer] [phone] Spoke with pt and wife at bedside regarding discharge planning for [facility]. Offered pt list of home health agencies to choose from.  Pt chose [facility] to render services. Glyn Ade, RN of [facility] notified. Patient made aware that [facility] will be in contact in 24-48 hours.  No DME needs identified at this time.

## 2017-06-25 NOTE — Clinical Social Work Note (Signed)
Clinical Social Work Assessment  Patient Details  Name: [name] MRN: [id number] Date of Birth: 09-Dec-1934  Date of referral:  06/25/17               Reason for consult:  Facility Placement                Permission sought to share information with:  Family Supports Permission granted to share information::  Yes, Verbal Permission Granted  Name::     [name]   Agency::  family  Relationship::  wife  Contact Information:  [place] [phone]  Housing/Transportation Living arrangements for the past 2 months:  Single Family Home(with wife. ) Source of Information:  Spouse Patient Interpreter Needed:  None Criminal Activity/Legal Involvement Pertinent to Current Situation/Hospitalization:  No - Comment as needed Significant Relationships:  Adult Children, Spouse Lives with:  Spouse Do you feel safe going back to the place where you live?  Yes Need for family participation in patient care:  Yes (Comment)  Care giving concerns:  CSW spoke with pt's wife at bedside for this assessment. Concersn that wife expressed include pt being verbally aggressive and physically aggressive with wife sometimes. Wife reports that she is unable to care for pt at the home as pt is keeping wife up at least 6 hours a night and wife has personal health issues that require she get rest.    Social Worker assessment / plan:  CSW spoke with pt and wife at bedside. During this time CSW was informed that pt is from home with wife. Wife reports that she has been doing her best in taking care of pt however at this point pt's wife expressed that she can no longer care for pt. CSW spoke with wife about placement options for pt and wife expressed that she has been working with DSS to get assistance with placing pt. During this assessment wife was very tearful and expressed the inability to care for pt as needed. [place] services have already been set up for pt in the event that pt is unable to be placed at the  time of discharge.   Employment status:  Retired Forensic scientist:  ([employer]) PT Recommendations:  Not assessed at this time Information / Referral to community resources:  Skilled Nursing Facility(spoke with pt about placement options. Pt's first choice is [facility])  Patient/Family's Response to care:  Wife's response to care was positive once speaking with CSW. Pt's response was very uninterested as pt was asleep and would wake up sometimes during the assessment. Wife is willing to provide any needed information to ensure that pt's needs are met as she knows that taking pt home is not the safest for her or pt.   Patient/Family's Understanding of and Emotional Response to Diagnosis, Current Treatment, and Prognosis:  No further questions or concerns have been presented to CSW at this time. [name] is understanding and agreeable to pt being kept over night for further monitoring of pt's needs at this time. CS explained d role in this process and wife thanked CSW and still confirmed no further questions at this time.   Emotional Assessment Appearance:  Appears stated age Attitude/Demeanor/Rapport:  Unable to Assess Affect (typically observed):  Unable to Assess Orientation:  (pt has dementia. ) Alcohol / Substance use:  Not Applicable Psych involvement (Current and /or in the community):     Discharge Needs  Concerns to be addressed:  Care Coordination, Basic Needs Readmission within the last  30 days:  No Current discharge risk:  Dependent with Mobility Barriers to Discharge:  Continued Medical Work up   [employer], [place] 06/25/2017, 3:24 PM

## 2017-06-25 NOTE — Op Note (Signed)
    Patient name: [name] MRN: [id number] DOB: 07-07-1934 Sex: male  06/25/2017 Pre-operative Diagnosis: decreased abi's Post-operative diagnosis:  Same Surgeon:  [name] Procedure Performed:  1.  Ultrasound-guided access, left femoral artery   2.  Abdominal aortogram  3.  Bilateral lower extremity runoff    Indications: The patient has previously undergone a right to left femoral-femoral bypass graft.  He is also undergone multiple iliac interventions.  His most recent ultrasound showed elevated velocities at the proximal anastomosis and he is here today for further evaluation.  Procedure:  The patient was identified in the holding area and taken to room 8.  The patient was then placed supine on the table and prepped and draped in the usual sterile fashion.  A time out was called.  Ultrasound was used to evaluate the  common femoral artery.  It was patent .  A digital ultrasound image was acquired.  A micropuncture was used to cannulate the left side of the femoral femoral crossover graft.  A wire was inserted followed by a micropuncture sheath.  Contrast injection was performed through the micropuncture sheath.  I then used a Berenstein 2 catheter to get wire access into the aorta and then advanced an Omni Flush catheter into the distal aorta and performed an abdominal aortogram followed by bilateral runoff. Findings:   Aortogram: The distal abdominal aorta is widely patent.  The right common and external iliac arteries are patent.  The stents within each artery are patent without significant stenosis.  There is approximately 50% luminal narrowing between the 2 stents.  The left iliac system is occluded.  Right Lower Extremity: There is approximately 50% stenosis at the origin of the right to left femoral-femoral bypass graft.  The profundofemoral artery is widely patent on the right.  Left Lower Extremity: The right to left femoral-femoral bypass graft is patent.  The left  profundofemoral artery is widely patent.  Bilateral superficial femoral arteries are occluded.  Intervention: None  Impression:  #1  Approximate 50% stenosis within the iliac artery on the right, in between 2 previously deployed stents.  #2  Approximate 50% proximal anastomotic narrowing.  I have elected not to intervene given the location of the stenosis.  Without his other medical issues I feel this would be best treated surgically.  If this progresses he would be an endovascular candidate albeit with some risk.    Theotis Burrow, M.D. [facility] [place] Office: [phone] Pager:  [phone]

## 2017-06-25 NOTE — Progress Notes (Signed)
CSW spoke with pt and wife at bedside. CSW provided further information to wife on how to get long term placement for pt. [name] is agreeable to this at this time. [name] expressed that she is currently working with DSS for help with pt as well. Per RN pt will be kept over night and will follow up with someone tomorrow as needed. RNCM was in the process of setting up [facility] for pt as neededl. CSW will continue to follow for nay further needs at this time.    [name] [name], MSW, [facility] Clinical Social Worker [phone]

## 2017-06-26 ENCOUNTER — Encounter (HOSPITAL_COMMUNITY): Payer: Self-pay | Admitting: Surgery

## 2017-06-26 ENCOUNTER — Telehealth: Payer: Self-pay | Admitting: Surgery

## 2017-06-26 NOTE — Evaluation (Signed)
Physical Therapy Evaluation Patient Details Name: [name] MRN: [id number] DOB: 09/16/1934 Today's Date: 06/26/2017   History of Present Illness  Pt is an 83 y.o. male admitted 06/25/17 now s/p LE angiography. PMH includes Alzheimer's dementia, CHF, COPD, HTN, PVD, DM.    Clinical Impression  Pt presents with an overall decrease in functional mobility secondary to above. Pt pleasantly confused, disoriented, and is a poor historian; per chart, lives with wife and has required increasing assist at home. Today, required increased time and effort for all mobility; close min guard for amb with RW. Pt with decreased safety awareness and is a significant fall risk. Pt would benefit from continued acute PT services to maximize functional mobility and independence prior to d/c with SNF-level therapies and potential long-term memory care.     Follow Up Recommendations SNF;Supervision/Assistance - 24 hour    Equipment Recommendations  (TBD next venue)    Recommendations for Other Services       Precautions / Restrictions Precautions Precautions: Fall Restrictions Weight Bearing Restrictions: No      Mobility  Bed Mobility Overal bed mobility: Needs Assistance Bed Mobility: Supine to Sit     Supine to sit: Min guard     General bed mobility comments: Increased time and effort, requiring multiple cues to attend to task and not pull out catheter  Transfers Overall transfer level: Needs assistance Equipment used: Rolling walker (2 wheeled) Transfers: Sit to/from Stand Sit to Stand: Min guard;From elevated surface         General transfer comment: Min guard for safety and balance  Ambulation/Gait Ambulation/Gait assistance: Min guard Ambulation Distance (Feet): 3 Feet Assistive device: Rolling walker (2 wheeled) Gait Pattern/deviations: Step-to pattern;Trunk flexed Gait velocity: Decreased Gait velocity interpretation: Below normal speed for age/gender General Gait Details:  Slow, unsteady amb to chair with RW and min guard for balance. Cues for safety  Stairs            Wheelchair Mobility    Modified Rankin (Stroke Patients Only)       Balance Overall balance assessment: Needs assistance   Sitting balance-Leahy Scale: Fair       Standing balance-Leahy Scale: Poor                               Pertinent Vitals/Pain Pain Assessment: No/denies pain    Home Living Family/patient expects to be discharged to:: Skilled nursing facility Living Arrangements: Spouse/significant other               Additional Comments: [name] primary cargiver. Per chart, having increasing difficulty assisting pt at home    Prior Function Level of Independence: Needs assistance   Gait / Transfers Assistance Needed: Pt poor historian. Reports does not use DME at home           Hand Dominance        Extremity/Trunk Assessment   Upper Extremity Assessment Upper Extremity Assessment: Generalized weakness    Lower Extremity Assessment Lower Extremity Assessment: Generalized weakness       Communication   Communication: No difficulties  Cognition Arousal/Alertness: Awake/alert Behavior During Therapy: WFL for tasks assessed/performed Overall Cognitive Status: History of cognitive impairments - at baseline Area of Impairment: Orientation;Attention;Memory;Following commands;Safety/judgement;Awareness;Problem solving                 Orientation Level: Disoriented to;Place;Time;Situation Current Attention Level: Sustained Memory: Decreased short-term memory Following Commands: Follows one step commands  inconsistently Safety/Judgement: Decreased awareness of safety;Decreased awareness of deficits Awareness: Intellectual Problem Solving: Difficulty sequencing;Requires verbal cues General Comments: Pt alert; pleasantly confused and disoriented. Soft mitts as pt attempting to remove condom catheter despite multiple cues       General Comments      Exercises     Assessment/Plan    PT Assessment Patient needs continued PT services  PT Problem List Decreased strength;Decreased activity tolerance;Decreased balance;Decreased mobility;Decreased cognition;Decreased knowledge of use of DME;Decreased safety awareness       PT Treatment Interventions DME instruction;Gait training;Functional mobility training;Therapeutic activities;Therapeutic exercise;Balance training;Patient/family education    PT Goals (Current goals can be found in the Care Plan section)  Acute Rehab PT Goals PT Goal Formulation: Patient unable to participate in goal setting Time For Goal Achievement: 07/10/17    Frequency Min 2X/week   Barriers to discharge        Co-evaluation               AM-PAC PT "6 Clicks" Daily Activity  Outcome Measure Difficulty turning over in bed (including adjusting bedclothes, sheets and blankets)?: A Little Difficulty moving from lying on back to sitting on the side of the bed? : A Little Difficulty sitting down on and standing up from a chair with arms (e.g., wheelchair, bedside commode, etc,.)?: Unable Help needed moving to and from a bed to chair (including a wheelchair)?: A Little Help needed walking in hospital room?: A Lot Help needed climbing 3-5 steps with a railing? : A Lot 6 Click Score: 14    End of Session   Activity Tolerance: Patient tolerated treatment well Patient left: in chair;with call bell/phone within reach;with chair alarm set;Other (comment);with nursing/sitter in room(with soft mitts applied) Nurse Communication: Mobility status PT Visit Diagnosis: Other abnormalities of gait and mobility (R26.89);Muscle weakness (generalized) (M62.81)    Time: 2952-8413 PT Time Calculation (min) (ACUTE ONLY): 17 min   Charges:   PT Evaluation $PT Eval Moderate Complexity: 1 Mod     PT G Codes:       Mabeline Caras, PT, DPT Acute Rehab Services  Pager: [place] 06/26/2017, 10:26 AM

## 2017-06-26 NOTE — NC FL2 (Signed)
[place] LEVEL OF CARE SCREENING TOOL     IDENTIFICATION  Patient Name: [name] Birthdate: 1935-02-16 Sex: male Admission Date (Current Location): 06/25/2017  [facility] and [place] Number:  Herbalist and Address:  [facility] [employer], [place] [address], [place], [place] [address]      Provider Number: [id number]  Attending Physician Name and Address:  Serafina Mitchell, MD  Relative Name and Phone Number:  Levander Campion (daughter) [phone]    Current Level of Care: Hospital Recommended Level of Care: [place] Prior Approval Number:    Date Approved/Denied:   PASRR Number: [id number]  Discharge Plan: SNF    Current Diagnoses: Patient Active Problem List   Diagnosis Date Noted  . Sepsis ([place]) 05/02/2017  . Diabetes mellitus ([place]) 12/25/2013  . Type I (juvenile type) diabetes mellitus with peripheral circulatory disorders, not stated as uncontrolled(250.71) 12/18/2013  . Acute DVT (deep venous thrombosis) ([place]) 12/12/2013  . PNA (pneumonia) 12/11/2013  . Candida esophagitis ([place]) 09/06/2013  . Chronic systolic CHF (congestive heart failure) ([place]) 09/06/2013  . CAP (community acquired pneumonia) 09/05/2013  . COPD exacerbation ([place]) 09/04/2013  . Microcytic anemia 09/03/2013  . Chronic respiratory failure with hypoxia ([place]) 09/01/2013  . Symptomatic anemia 09/01/2013  . DOE (dyspnea on exertion) 09/01/2013  . Atherosclerosis of native arteries of the extremities with ulceration ([place]) 06/24/2013  . PAD (peripheral artery disease) ([place]) 04/28/2013  . Chest pain 03/23/2013  . Nausea with vomiting 03/23/2013  . Pain in joint, ankle and foot 03/23/2013  . Dementia 07/04/2012  . URI (upper respiratory infection) 07/04/2012  . PVD (peripheral vascular disease) ([place]) 04/21/2012  . Leg pain 04/18/2012  . Upper airway cough syndrome 06/02/2007  . Hyperlipidemia 05/07/2007  . PVD 05/07/2007  . COPD COPD II  05/07/2007   . DYSPNEA 05/07/2007  . Essential hypertension 05/07/2007    Orientation RESPIRATION BLADDER Height & Weight     Self(dementia)  Normal External catheter Weight: 155 lb (70.3 kg) Height:  5\' 6"  (167.6 cm)  BEHAVIORAL SYMPTOMS/MOOD NEUROLOGICAL BOWEL NUTRITION STATUS      Continent Diet(regular)  AMBULATORY STATUS COMMUNICATION OF NEEDS Skin   Limited Assist Verbally                         Personal Care Assistance Level of Assistance  Bathing, Feeding, Dressing Bathing Assistance: Limited assistance Feeding assistance: Limited assistance Dressing Assistance: Limited assistance     Functional Limitations Info  Sight, Hearing, Speech Sight Info: Adequate Hearing Info: Adequate Speech Info: Adequate    SPECIAL CARE FACTORS FREQUENCY  PT (By licensed PT), OT (By licensed OT)     PT Frequency: 5x wk OT Frequency: 5x wk            Contractures Contractures Info: Not present    Additional Factors Info  Code Status, Allergies Code Status Info: Full Code Allergies Info: NO Known Allergies           Current Medications (06/26/2017):  This is the current hospital active medication list Current Facility-Administered Medications  Medication Dose Route Frequency Provider Last Rate Last Dose  . 0.9 %  sodium chloride infusion  250 mL Intravenous PRN Serafina Mitchell, MD      . 0.9 %  sodium chloride infusion  250 mL Intravenous PRN Laurence Slate M, PA-C      . acetaminophen (TYLENOL) tablet 650 mg  650 mg Oral Q4H PRN Brabham,  Butch Penny, MD   650 mg at 06/25/17 1326  . albuterol (PROVENTIL) (2.5 MG/3ML) 0.083% nebulizer solution 2.5 mg  2.5 mg Nebulization Q6H PRN Laurence Slate M, PA-C      . alum & mag hydroxide-simeth (MAALOX/MYLANTA) 200-200-20 MG/5ML suspension 15-30 mL  15-30 mL Oral Q2H PRN Laurence Slate M, PA-C      . bisacodyl (DULCOLAX) EC tablet 5 mg  5 mg Oral Daily PRN Laurence Slate M, PA-C      . clopidogrel (PLAVIX) tablet 75 mg  75 mg Oral Daily Laurence Slate M, PA-C   75 mg at 06/26/17 1610  . digoxin (LANOXIN) tablet 0.125 mg  0.125 mg Oral Daily Laurence Slate M, PA-C   0.125 mg at 06/26/17 9604  . docusate sodium (COLACE) capsule 100 mg  100 mg Oral BID Laurence Slate M, PA-C   100 mg at 06/26/17 5409  . enoxaparin (LOVENOX) injection 30 mg  30 mg Subcutaneous Q24H Laurence Slate M, PA-C   30 mg at 06/26/17 1146  . fluticasone furoate-vilanterol (BREO ELLIPTA) 100-25 MCG/INH 1 puff  1 puff Inhalation Daily Ulyses Amor, PA-C   1 puff at 06/26/17 8119  . furosemide (LASIX) tablet 20 mg  20 mg Oral Daily Laurence Slate M, PA-C   20 mg at 06/26/17 0920  . guaiFENesin-dextromethorphan (ROBITUSSIN DM) 100-10 MG/5ML syrup 15 mL  15 mL Oral Q4H PRN Laurence Slate M, PA-C      . hydrALAZINE (APRESOLINE) injection 5 mg  5 mg Intravenous Q20 Min PRN Laurence Slate M, PA-C   5 mg at 06/26/17 0617  . labetalol (NORMODYNE,TRANDATE) injection 10 mg  10 mg Intravenous Q10 min PRN Serafina Mitchell, MD   10 mg at 06/25/17 1716  . linagliptin (TRADJENTA) tablet 5 mg  5 mg Oral Daily Laurence Slate M, PA-C   5 mg at 06/26/17 1478  . metoprolol tartrate (LOPRESSOR) tablet 12.5 mg  12.5 mg Oral BID Laurence Slate M, PA-C   12.5 mg at 06/26/17 0920  . morphine 2 MG/ML injection 1-2 mg  1-2 mg Intravenous Q2H PRN Laurence Slate M, PA-C      . ondansetron [facility]) injection 4 mg  4 mg Intravenous Q6H PRN Serafina Mitchell, MD      . oxyCODONE (Oxy IR/ROXICODONE) immediate release tablet 5-10 mg  5-10 mg Oral Q4H PRN Laurence Slate M, PA-C      . pantoprazole (PROTONIX) EC tablet 40 mg  40 mg Oral Daily Laurence Slate M, PA-C   40 mg at 06/26/17 2956  . phenol (CHLORASEPTIC) mouth spray 1 spray  1 spray Mouth/Throat PRN Laurence Slate M, PA-C      . potassium chloride SA (K-DUR,KLOR-CON) CR tablet 20-40 mEq  20-40 mEq Oral Once [place], Emma M, PA-C      . senna-docusate (Senokot-S) tablet 1 tablet  1 tablet Oral QHS PRN Ulyses Amor, PA-C      . sodium chloride flush (NS)  0.9 % injection 3 mL  3 mL Intravenous Q12H Serafina Mitchell, MD   3 mL at 06/25/17 2051  . sodium chloride flush (NS) 0.9 % injection 3 mL  3 mL Intravenous PRN Serafina Mitchell, MD      . sodium chloride flush (NS) 0.9 % injection 3 mL  3 mL Intravenous Q12H Laurence Slate M, PA-C   3 mL at 06/26/17 0920  . sodium chloride flush (NS) 0.9 % injection 3 mL  3 mL Intravenous PRN Laurence Slate  M, PA-C         Discharge Medications: Please see discharge summary for a list of discharge medications.  Relevant Imaging Results:  Relevant Lab Results:   Additional Information SS# [id number]  Wende Neighbors, LCSW

## 2017-06-26 NOTE — Telephone Encounter (Signed)
LVM for pts appt 5/13 Mld lttr

## 2017-06-26 NOTE — Telephone Encounter (Signed)
-----   Message from Mena Goes, RN sent at 06/25/2017  5:19 PM EDT ----- Regarding: appt in 1 month,     ----- Message ----- From: Serafina Mitchell, MD Sent: 06/25/2017   1:02 PM To: Vvs Charge Pool  Please arrange patient to have outpatient PT  change follow up to see me in 1 month

## 2017-06-26 NOTE — Progress Notes (Addendum)
[facility] [place], but not oriented to person , place or time.   Objective (!) 179/77 60 97.7 F (36.5 C) (Oral) (!) 25 96%  Intake/Output Summary (Last 24 hours) at 06/26/2017 0737 Last data filed at 06/26/2017 0517 Gross per 24 hour  Intake -  Output 700 ml  Net -700 ml    Doppler DP B.  Feet warm to touch without ischemic changes. Left angio stick site without hematoma, soft. Heart RRR Lungs non labored breathing Gen NAD  Assessment/Planning: POD # 1 Angiogram Pre procedure  ultrasound showed elevated velocities at the proximal anastomosis of the right to left fem-fem by pass.  Impression: post angiogram.             #1  Approximate 50% stenosis within the iliac artery on the right, in between 2 previously deployed stents.             #2  Approximate 50% proximal anastomotic narrowing.  I have elected not to intervene given the location of the stenosis.  Without his other medical issues I feel this would be best treated surgically.  If this progresses he would be an endovascular candidate albeit with some risk.  He has doppler flow B DP without ischemic changes to B LE. If he develops a non healing wound or has rest pain we will plan to intervene.  Other wise we will see him back in our office for repeat by pass duplex and ABI's.  Pending SNF placement at discharge     Roxy Horseman 06/26/2017 7:37 AM --  Laboratory Lab Results: Recent Labs    06/25/17 0802 06/25/17 1825  WBC  --  8.2  HGB 12.6* 11.8*  HCT 37.0* 37.7*  PLT  --  243   BMET Recent Labs    06/25/17 0802 06/25/17 1825  NA 140  --   K 3.9  --   CL 104  --   GLUCOSE 126*  --   BUN 12  --   CREATININE 1.10 0.99    COAG Lab Results  Component Value Date   INR 0.96 05/12/2017   INR 1.05 05/02/2017   INR 1.00 04/28/2013   No results found for: PTT  At the request of the patient's family, he was kept overnight for observation  following his arteriogram.  He suffers from chronic dementia which has progressed.  There are concerns from the family about being able to care for him.  We will initiate evaluation for long-term placement or family assistance.  [name]

## 2017-06-27 ENCOUNTER — Other Ambulatory Visit: Payer: Self-pay

## 2017-06-27 ENCOUNTER — Encounter (HOSPITAL_COMMUNITY): Payer: Self-pay

## 2017-06-27 ENCOUNTER — Other Ambulatory Visit: Payer: Self-pay | Admitting: *Deleted

## 2017-06-27 MED FILL — Lidocaine HCl Local Inj 1%: INTRAMUSCULAR | Qty: 20 | Status: AC

## 2017-06-27 MED FILL — Heparin Sodium (Porcine) 2 Unit/ML in Sodium Chloride 0.9%: INTRAMUSCULAR | Qty: 1000 | Status: AC

## 2017-06-27 NOTE — Progress Notes (Signed)
    Subjective  -   No acute events States his feet did not hurt last night   Physical Exam:  Feet warm       Assessment/Plan:    Awaiting SNF placement for advanced alzheimers  [name] 06/27/2017 9:11 AM --  Vitals:   06/27/17 0840 06/27/17 0846  BP: (!) 175/82   Pulse: 68 74  Resp:    Temp:    SpO2:      Intake/Output Summary (Last 24 hours) at 06/27/2017 0911 Last data filed at 06/26/2017 2330 Gross per 24 hour  Intake 600 ml  Output 702 ml  Net -102 ml     Laboratory CBC    Component Value Date/Time   WBC 8.2 06/25/2017 1825   HGB 11.8 (L) 06/25/2017 1825   HCT 37.7 (L) 06/25/2017 1825   PLT 243 06/25/2017 1825    BMET    Component Value Date/Time   NA 140 06/25/2017 0802   K 3.9 06/25/2017 0802   CL 104 06/25/2017 0802   CO2 28 05/12/2017 1649   GLUCOSE 126 (H) 06/25/2017 0802   BUN 12 06/25/2017 0802   CREATININE 0.99 06/25/2017 1825   CREATININE 1.13 07/12/2015 1408   CALCIUM 8.8 (L) 05/12/2017 1649   GFRNONAA >60 06/25/2017 1825   GFRAA >60 06/25/2017 1825    COAG Lab Results  Component Value Date   INR 0.96 05/12/2017   INR 1.05 05/02/2017   INR 1.00 04/28/2013   No results found for: PTT  Antibiotics Anti-infectives (From admission, onward)   None       V. Leia Alf, M.D. [facility] [place] Office: [phone] Pager:  [phone]

## 2017-06-27 NOTE — Progress Notes (Signed)
Clinical Social Worker following patient/family for support and discharge need. CSW contacted [employer] to start authorization for patient to discharge to short term rehab. [employer] contacted CSW stating that at this time insurance will not be able to offer authorization due to insurance stating patient is more custodial. [employer] requested a per to per from MD to appeal the decision. [employer] requested MD please contact  Dr. Lynder Parents at [phone] at [employer]. CSW made PA aware of denial from insurance company.  CSW met with patients wife [name]) at bedside to make her aware of denial. CSW explained that if insurance is still denying patients entrance into a rehab facility that patient would have to discharge back home. [name] stated "she understands and will take patient back home and take care of him". [name] stated that she would like help at home and stated she would like patient placed in long term facility. CSW informed [name] that the hospital will be unable to do that, but stated they can provide resources to assist her. [name] requested that if patient discharge home that he receives home health in the morning.   Rhea Pink, MSW,  [place]

## 2017-06-27 NOTE — Care Management Note (Signed)
Case Management Note Marvetta Gibbons RN, BSN Unit 4E-Case Manager [phone]  Patient Details  Name: [name] MRN: [id number] Date of Birth: 09/05/1934  Subjective/Objective:   Pt admitted s/p angiogram                  Action/Plan: PTA pt lived at home with wife- CSW consulted for SNF placement post procedure- pt stable for discharge- awaiting SNF placement- CSW following for placement needs.   Expected Discharge Date:  (unknown)               Expected Discharge Plan:  [place]  In-House Referral:  Clinical Social Work  Discharge planning Services  CM Consult  Post Acute Care Choice:  NA Choice offered to:  NA  DME Arranged:    DME Agency:     HH Arranged:    [place] Agency:     Status of Service:  Completed, signed off  If discussed at [employer] of Stay Meetings, dates discussed:    Discharge Disposition: skilled facility   Additional Comments:  Dawayne Patricia, RN 06/27/2017, 11:36 AM

## 2017-06-28 NOTE — Progress Notes (Signed)
Physical Therapy Treatment Patient Details Name: [name] MRN: [id number] DOB: 1935-01-08 Today's Date: 06/28/2017    History of Present Illness Pt is an 83 y.o. male admitted 06/25/17 now s/p LE angiography. PMH includes Alzheimer's dementia, CHF, COPD, HTN, PVD, DM.    PT Comments    Pt. was confused and disoriented.  He required increased time, effort and cueing to complete all mobility.  Cueing for technique was hard because he only understood simple commands like sit up or stand up.   Pt was min guard with ambulation and went much further than the first session despite having LOBx3 and a scissoring gait.  He required cues for a flexed trunk, hand placement and keeping his feet inside the walker when turning.  He was willing and able to do therapeutic exercise and only needed verbal cues to slow down.  The pt is impulsive so he remains a fall risk. Shorts were put on to prevent the pt from continuously pulling off his catheter after using soft mitts did not work.   Pt continues to be a good candidate for a SNF due to LOB during gait and assistance needed for standing which affects his ability to be safe.    BP pre-session was 148/75.  No complaints of dizziness during session.   Follow Up Recommendations  SNF;Supervision/Assistance - 24 hour     Equipment Recommendations       Recommendations for Other Services       Precautions / Restrictions Precautions Precautions: Fall Restrictions Weight Bearing Restrictions: No    Mobility  Bed Mobility Overal bed mobility: Needs Assistance Bed Mobility: Supine to Sit     Supine to sit: Min guard     General bed mobility comments: Increased time and effort, requiring multiple cues to attend to task and not pull out catheter.  Verbal cues were given for technique and hand placement but due to the pt's inability to undertand them cues were made simpler.    Transfers Overall transfer level: Needs assistance Equipment used: Rolling  walker (2 wheeled) Transfers: (Pt required verbal cues to put his hands back on the chair when sitting down.  ) Sit to Stand: Min guard;From elevated surface         General transfer comment: Min guard for safety and balance  Ambulation/Gait Ambulation/Gait assistance: Min guard Ambulation Distance (Feet): (LOBx3) Assistive device: Rolling walker (2 wheeled) Gait Pattern/deviations: Step-to pattern;Trunk flexed;Scissoring(Verbal cues to keep feet inside walker when turning.  ) Gait velocity: Decreased   General Gait Details: Slow, unsteady amb with a chair follow for safety and min guard for balance. Cues for safety, flexed trunk, and keeping feet inside walker during turns.     Stairs            Wheelchair Mobility    Modified Rankin (Stroke Patients Only)       Balance Overall balance assessment: Needs assistance                                          Cognition Arousal/Alertness: Awake/alert Behavior During Therapy: WFL for tasks assessed/performed Overall Cognitive Status: History of cognitive impairments - at baseline Area of Impairment: Orientation;Attention;Memory;Following commands;Safety/judgement;Awareness;Problem solving                 Orientation Level: Disoriented to;Place;Time;Situation Current Attention Level: Sustained Memory: Decreased short-term memory Following Commands: Follows one step commands  inconsistently Safety/Judgement: Decreased awareness of safety;Decreased awareness of deficits Awareness: Intellectual Problem Solving: Difficulty sequencing;Requires verbal cues General Comments: Pt alert; pleasantly confused and disoriented. Shorts were put on as pt attempting to remove condom catheter despite multiple cues      Exercises General Exercises - Lower Extremity Long Arc Quad: AROM;10 reps;Both Other Exercises Other Exercises: Sit to Stands/10 reps/Strength    General Comments        Pertinent  Vitals/Pain Pain Assessment: No/denies pain    Home Living                      Prior Function            PT Goals (current goals can now be found in the care plan section) Acute Rehab PT Goals PT Goal Formulation: Patient unable to participate in goal setting Time For Goal Achievement: 07/10/17 Progress towards PT goals: Progressing toward goals    Frequency    Min 2X/week      PT Plan      Co-evaluation              AM-PAC PT "6 Clicks" Daily Activity  Outcome Measure  Difficulty turning over in bed (including adjusting bedclothes, sheets and blankets)?: A Little Difficulty moving from lying on back to sitting on the side of the bed? : A Little Difficulty sitting down on and standing up from a chair with arms (e.g., wheelchair, bedside commode, etc,.)?: A Little Help needed moving to and from a bed to chair (including a wheelchair)?: A Little Help needed walking in hospital room?: A Little   6 Click Score: 15    End of Session Equipment Utilized During Treatment: Gait belt Activity Tolerance: Patient tolerated treatment well Patient left: in chair;with call bell/phone within reach;with chair alarm set Nurse Communication: Mobility status PT Visit Diagnosis: Other abnormalities of gait and mobility (R26.89);Muscle weakness (generalized) (M62.81)     Time: 1962-2297 PT Time Calculation (min) (ACUTE ONLY): 37 min  Charges:  $Gait Training: 8-22 mins $Therapeutic Activity: 8-22 mins                    G Codes:       Terri Skains, SPTA [phone]    Terri Skains 06/28/2017, 2:11 PM

## 2017-06-28 NOTE — Progress Notes (Signed)
Pt discharged home with wife. IV and telemetry box removed. [name] received discharge instructions and all questions were answered. Pt left with all belongings. Pt discharged via wheelchair and was accompanied by this RN and pt's wife.   Ara Kussmaul BSN, RN

## 2017-06-28 NOTE — Progress Notes (Signed)
  Progress Note    06/28/2017 7:50 AM 3 Days Post-Op  Subjective:  No complaints this morning   Vitals:   06/27/17 1932 06/28/17 0425  BP: (!) 179/67   Pulse: 61   Resp: (!) 21   Temp: 98.1 F (36.7 C) 98 F (36.7 C)  SpO2: 99% 98%   Physical Exam: Cardiac:  RRR Lungs:  Non labored Extremities:  R groin cath site soft; feet symmetrically warm to touch Abdomen:  Soft Neurologic: baseline  CBC    Component Value Date/Time   WBC 8.2 06/25/2017 1825   RBC 4.30 06/25/2017 1825   HGB 11.8 (L) 06/25/2017 1825   HCT 37.7 (L) 06/25/2017 1825   PLT 243 06/25/2017 1825   MCV 87.7 06/25/2017 1825   MCV 88.7 07/12/2015 1426   MCH 27.4 06/25/2017 1825   MCHC 31.3 06/25/2017 1825   RDW 14.7 06/25/2017 1825   LYMPHSABS 1.6 05/12/2017 1649   MONOABS 0.8 05/12/2017 1649   EOSABS 0.5 05/12/2017 1649   BASOSABS 0.0 05/12/2017 1649    BMET    Component Value Date/Time   NA 140 06/25/2017 0802   K 3.9 06/25/2017 0802   CL 104 06/25/2017 0802   CO2 28 05/12/2017 1649   GLUCOSE 126 (H) 06/25/2017 0802   BUN 12 06/25/2017 0802   CREATININE 0.99 06/25/2017 1825   CREATININE 1.13 07/12/2015 1408   CALCIUM 8.8 (L) 05/12/2017 1649   GFRNONAA >60 06/25/2017 1825   GFRAA >60 06/25/2017 1825    INR    Component Value Date/Time   INR 0.96 05/12/2017 1650     Intake/Output Summary (Last 24 hours) at 06/28/2017 0750 Last data filed at 06/27/2017 2337 Gross per 24 hour  Intake -  Output 525 ml  Net -525 ml     Assessment/Plan:  83 y.o. male is s/p diagnostic aortogram 3 Days Post-Op   Continue plavix Insurance denied claim for long term care facility Case management consulted for home health if applicable Will discuss disposition with Dr. Army Chaco, PA-C [facility] [phone] 06/28/2017 7:50 AM

## 2017-06-28 NOTE — Care Management Note (Signed)
Case Management Note Marvetta Gibbons RN, BSN Unit 4E-Case Manager [phone]  Patient Details  Name: [name] MRN: [id number] Date of Birth: April 01, 1934  Subjective/Objective:   Pt admitted s/p angiogram                  Action/Plan: PTA pt lived at home with wife- CSW consulted for SNF placement post procedure- pt stable for discharge- awaiting SNF placement- CSW following for placement needs.   Expected Discharge Date:  06/28/17               Expected Discharge Plan:  [place]  In-House Referral:  Clinical Social Work  Discharge planning Services  CM Consult  Post Acute Care Choice:  Durable Medical Equipment, Home Health Choice offered to:  Spouse  DME Arranged:  N/A DME Agency:  NA  HH Arranged:  RN, PT, OT HH Agency:  [facility]  Status of Service:  Completed, signed off  If discussed at [place] of Stay Meetings, dates discussed:    Discharge Disposition: home/home health   Additional Comments:  06/28/17- 1245- Marvetta Gibbons RN, CM- insurance has denied [facility] stay- pt will need to return home with spouse- [place] orders have been placed- call made to wife at home to discuss transition needs- per TC conversation pt states that pt has all needed DME, has an aide that comes daily from 7-9 to assist with bathing, and pt goes to a day program during the week that he will need a note to return to from the doctor here- bedside RN to ask MD about note for day program. Choice offered for [facility] services- wife had chosen [facility] while in the ED- confirmed this was still her choice- Call made to [place] with [facility] for HHRN/PT/OT needs- they have accepted referral and will f/u with pt/wife on return home.Wife states she will come provide transport when pt is ready for discharge today.   Dawayne Patricia, RN 06/28/2017, 12:57 PM

## 2017-07-01 DIAGNOSIS — H6123 Impacted cerumen, bilateral: Secondary | ICD-10-CM | POA: Diagnosis not present

## 2017-07-01 NOTE — Progress Notes (Signed)
Patient suffers from chronic heart failure which is managed by Dr. Einar Gip.  Because of his CHF, I can not add Pletal to his medical regimen.    WB

## 2017-07-04 NOTE — Discharge Summary (Signed)
Physician Discharge Summary   Patient ID: [name] [id number] 82 y.o. 1934/10/14  Admit date: 06/25/2017  Discharge date and time: 06/28/2017  4:33 PM   Admitting Physician: Edwin Mitchell, MD   Discharge Physician: same  Admission Diagnoses: PAD (peripheral artery disease) [facility]) [I73.9]  Discharge Diagnoses: same Dementia  Admission Condition: fair  Discharged Condition: fair  Indication for Admission: decreased ABIs  Hospital Course: [name] is a 83 year old male who came in as an outpatient due to decreased ABIs for aortogram with bilateral lower extremity runoff and possible revascularization.  He has a history of right to left femoral to femoral bypass graft as well as history of right external iliac artery stent placed in January 2018.  Aortogram demonstrated about 50% stenosis within iliac artery on the right between the 2 previously deployed stents as well as 50% stenosis of the inflow anastomosis of right to left femoral to femoral bypass.  Dr. Trula [name] felt there was an indication to intervene and if anything was needed it would be have to be treated surgically.  However due to other chronical medical conditions as well as dementia Dr. Trula [name] has elected to watch this and proceed conservatively.  The patient tolerated the procedure well and was admitted to the hospital.  Much of hospital stay consisted of consultation with case management as well as social work for long-term placement as patient's wife is unable to care for her husband given his advanced dementia.  Please refer to social work note as to why patient was not a candidate for long-term placement.  He will be discharged with home health services.  He will follow-up in office with Dr. Trula [name].  Discharge instructions were reviewed with the patient's wife and she voiced their understanding.  He was discharged in stable condition.  Consults: None  Treatments: surgery: diagnostic aortogram with bilateral lower  extremity runoff by Dr. Trula [name] on 06/25/17  Discharge Exam: See progress note 06/28/17 Vitals:   06/28/17 1119 06/28/17 1200  BP:  138/74  Pulse: 70 71  Resp: 20   Temp:    SpO2: 98%     Disposition: Home with home health services  Patient Instructions:  Allergies as of 06/28/2017   No Known Allergies     Medication List    TAKE these medications   albuterol (2.5 MG/3ML) 0.083% nebulizer solution Commonly known as:  PROVENTIL Take 2.5 mg by nebulization every 6 (six) hours as needed for wheezing or shortness of breath.   BREO ELLIPTA 100-25 MCG/INH Aepb Generic drug:  fluticasone furoate-vilanterol Inhale 1 puff into the lungs daily.   clopidogrel 75 MG tablet Commonly known as:  PLAVIX TAKE 1 TABLET BY MOUTH DAILY. What changed:    how much to take  how to take this  when to take this   digoxin 0.125 MG tablet Commonly known as:  LANOXIN Take 0.125 mg by mouth daily.   famotidine 20 MG tablet Commonly known as:  PEPCID One at bedtime What changed:    how much to take  how to take this  when to take this  additional instructions   furosemide 20 MG tablet Commonly known as:  LASIX Take 1 tablet (20 mg total) by mouth daily.   metoprolol tartrate 25 MG tablet Commonly known as:  LOPRESSOR Take 0.5 tablets (12.5 mg total) by mouth 2 (two) times daily.   pantoprazole 40 MG tablet Commonly known as:  PROTONIX Take 40 mg by mouth daily.   pentoxifylline 400 MG CR  tablet Commonly known as:  TRENTAL Take 1 tablet (400 mg total) by mouth 3 (three) times daily with meals.   sitaGLIPtin 100 MG tablet Commonly known as:  JANUVIA Take 100 mg by mouth daily.      Activity: activity as tolerated Diet: regular diet Wound Care: none needed  Follow-up with Dr. Trula [name]  Signed: Dagoberto [name] 07/04/2017 1:08 PM

## 2017-07-05 DIAGNOSIS — Z79899 Other long term (current) drug therapy: Secondary | ICD-10-CM | POA: Diagnosis not present

## 2017-07-21 ENCOUNTER — Encounter (HOSPITAL_COMMUNITY): Payer: Self-pay | Admitting: Emergency Medicine

## 2017-07-21 ENCOUNTER — Emergency Department (HOSPITAL_COMMUNITY)
Admission: EM | Admit: 2017-07-21 | Discharge: 2017-07-21 | Disposition: A | Discharge status: Home / Self Care | Payer: PPO | Attending: Emergency Medicine | Admitting: Emergency Medicine

## 2017-07-21 DIAGNOSIS — F0280 Dementia in other diseases classified elsewhere without behavioral disturbance: Secondary | ICD-10-CM | POA: Insufficient documentation

## 2017-07-21 DIAGNOSIS — Z79899 Other long term (current) drug therapy: Secondary | ICD-10-CM | POA: Diagnosis not present

## 2017-07-21 DIAGNOSIS — Z85038 Personal history of other malignant neoplasm of large intestine: Secondary | ICD-10-CM | POA: Insufficient documentation

## 2017-07-21 DIAGNOSIS — Z7902 Long term (current) use of antithrombotics/antiplatelets: Secondary | ICD-10-CM | POA: Diagnosis not present

## 2017-07-21 DIAGNOSIS — Z87891 Personal history of nicotine dependence: Secondary | ICD-10-CM | POA: Diagnosis not present

## 2017-07-21 DIAGNOSIS — R296 Repeated falls: Secondary | ICD-10-CM | POA: Diagnosis not present

## 2017-07-21 DIAGNOSIS — R350 Frequency of micturition: Secondary | ICD-10-CM | POA: Diagnosis present

## 2017-07-21 DIAGNOSIS — G309 Alzheimer's disease, unspecified: Secondary | ICD-10-CM | POA: Insufficient documentation

## 2017-07-21 DIAGNOSIS — J449 Chronic obstructive pulmonary disease, unspecified: Secondary | ICD-10-CM | POA: Insufficient documentation

## 2017-07-21 DIAGNOSIS — E109 Type 1 diabetes mellitus without complications: Secondary | ICD-10-CM | POA: Diagnosis not present

## 2017-07-21 DIAGNOSIS — I110 Hypertensive heart disease with heart failure: Secondary | ICD-10-CM | POA: Diagnosis not present

## 2017-07-21 DIAGNOSIS — N3000 Acute cystitis without hematuria: Secondary | ICD-10-CM | POA: Diagnosis not present

## 2017-07-21 DIAGNOSIS — I5022 Chronic systolic (congestive) heart failure: Secondary | ICD-10-CM | POA: Insufficient documentation

## 2017-07-21 LAB — URINALYSIS, ROUTINE W REFLEX MICROSCOPIC
Bilirubin Urine: NEGATIVE
Glucose, UA: NEGATIVE mg/dL
Ketones, ur: NEGATIVE mg/dL
Nitrite: NEGATIVE
Protein, ur: 100 mg/dL — AB
Specific Gravity, Urine: 1.013 (ref 1.005–1.030)
WBC, UA: >50 WBC/hpf — ABNORMAL HIGH (ref 0–5)
pH: 6 (ref 5.0–8.0)

## 2017-07-21 MED ORDER — CEFTRIAXONE SODIUM 1 G IJ SOLR
1.0000 g | Freq: Once | INTRAMUSCULAR | Status: AC
  Administered 2017-07-21: 1 g via INTRAMUSCULAR
  Filled 2017-07-21: qty 10

## 2017-07-21 MED ORDER — LIDOCAINE HCL (PF) 1 % IJ SOLN
INTRAMUSCULAR | Status: AC
  Administered 2017-07-21: 2.1 mL
  Filled 2017-07-21: qty 30

## 2017-07-21 MED ORDER — DOCUSATE SODIUM 100 MG PO CAPS: 100.0000 mg | ORAL_CAPSULE | Freq: Two times a day (BID) | ORAL | 0 refills | Status: DC | PRN

## 2017-07-21 MED ORDER — TRAMADOL HCL 50 MG PO TABS: 50.0000 mg | ORAL_TABLET | Freq: Four times a day (QID) | ORAL | 0 refills | Status: DC | PRN

## 2017-07-21 MED ORDER — CEPHALEXIN 500 MG PO CAPS: 500.0000 mg | ORAL_CAPSULE | Freq: Three times a day (TID) | ORAL | 0 refills | Status: DC

## 2017-07-21 NOTE — ED Provider Notes (Signed)
[facility] DEPT Provider Note   CSN: [id number] Arrival date & time: 07/21/17  1042     History   Chief Complaint Chief Complaint  Patient presents with  . Fall  . Urinary Frequency    HPI SARAH ZERBY is a 83 y.o. male.  HPI   66yM with increased urinary frequency and multiple falls recently. He is accompanied by his wife who provides most of history as he has severe dementia. He has several falls over the past few weeks and has required either EMS or neighbors to help get him back up. In the past several days he has been urinating frequently. She estimates he got up over 10 times last night to try and urinate.   Past Medical History:  Diagnosis Date  . Alzheimer's dementia 2013  . Anemia    HX OF ANEMIA  . CHF (congestive heart failure) ([place])   . Colon cancer ([place])   . COPD (chronic obstructive pulmonary disease) ([place])   . Depression   . Dyspnea   . Hyperlipidemia   . Hypertension   . Pneumonia 2014  . PVD (peripheral vascular disease) ([place])   . Type II diabetes mellitus [facility])     Patient Active Problem List   Diagnosis Date Noted  . Sepsis ([place]) 05/02/2017  . Diabetes mellitus ([place]) 12/25/2013  . Type I (juvenile type) diabetes mellitus with peripheral circulatory disorders, not stated as uncontrolled(250.71) 12/18/2013  . Acute DVT (deep venous thrombosis) ([place]) 12/12/2013  . PNA (pneumonia) 12/11/2013  . Candida esophagitis ([place]) 09/06/2013  . Chronic systolic CHF (congestive heart failure) ([place]) 09/06/2013  . CAP (community acquired pneumonia) 09/05/2013  . COPD exacerbation ([place]) 09/04/2013  . Microcytic anemia 09/03/2013  . Chronic respiratory failure with hypoxia ([place]) 09/01/2013  . Symptomatic anemia 09/01/2013  . DOE (dyspnea on exertion) 09/01/2013  . Atherosclerosis of native arteries of the extremities with ulceration ([place]) 06/24/2013  . PAD (peripheral artery disease) ([place]) 04/28/2013  . Chest pain 03/23/2013    . Nausea with vomiting 03/23/2013  . Pain in joint, ankle and foot 03/23/2013  . Dementia 07/04/2012  . URI (upper respiratory infection) 07/04/2012  . PVD (peripheral vascular disease) ([place]) 04/21/2012  . Leg pain 04/18/2012  . Upper airway cough syndrome 06/02/2007  . Hyperlipidemia 05/07/2007  . PVD 05/07/2007  . COPD COPD II  05/07/2007  . DYSPNEA 05/07/2007  . Essential hypertension 05/07/2007    Past Surgical History:  Procedure Laterality Date  . ABDOMINAL ANGIOGRAM N/A 04/20/2013   Procedure: ABDOMINAL ANGIOGRAM;  Surgeon: Angelia Mould, MD;  Location: [facility] CATH LAB;  Service: Cardiovascular;  Laterality: N/A;  . ABDOMINAL AORTOGRAM W/LOWER EXTREMITY N/A 11/20/2016   Procedure: ABDOMINAL AORTOGRAM W/LOWER EXTREMITY;  Surgeon: Serafina Mitchell, MD;  Location: [facility] CV LAB;  Service: Cardiovascular;  Laterality: N/A;  . CATARACT EXTRACTION Right 02/2013  . CATARACT EXTRACTION W/ INTRAOCULAR LENS IMPLANT Left 01/2013  . COLON RESECTION  1997   /enc. notes 05/17/2004 (04/28/2013)  . COLON SURGERY  1996   cancer  . ESOPHAGOGASTRODUODENOSCOPY N/A 09/05/2013   Procedure: ESOPHAGOGASTRODUODENOSCOPY (EGD);  Surgeon: Lear Ng, MD;  [facility] ENDOSCOPY;  Service: Endoscopy;  Laterality: N/A;  . FEMORAL ARTERY - FEMORAL ARTERY BYPASS GRAFT     right to left/enc. notes 05/17/2004 (04/28/2013)  . FEMORAL BYPASS Right    /enc. notes 05/17/2004 (04/28/2013)  . FEMORAL-POPLITEAL BYPASS GRAFT Right 04/29/2013   Procedure:  FEMORAL-POPLITEAL ARTERY Bypass Graft with intraoperative ultrasound and arteriogarm;  Surgeon: Mal Misty, MD;  Location: [place];  Service: Vascular;  Laterality: Right;  . FIBEROPTIC BRONCHOSCOPY     Yvette Rack. notes 03/16/2005 (04/28/2013)  . INTRAOPERATIVE ARTERIOGRAM Right 04/29/2013   Procedure: INTRA OPERATIVE ARTERIOGRAM;  Surgeon: Mal Misty, MD;  Location: [place];  Service: Vascular;  Laterality: Right;  . LOWER EXTREMITY ANGIOGRAM  04/20/2013    Procedure: LOWER EXTREMITY ANGIOGRAM;  Surgeon: Angelia Mould, MD;  Location: [facility] CATH LAB;  Service: Cardiovascular;;  . LOWER EXTREMITY ANGIOGRAPHY Bilateral 06/25/2017   Procedure: LOWER EXTREMITY ANGIOGRAPHY;  Surgeon: Serafina Mitchell, MD;  Location: [place] CV LAB;  Service: Cardiovascular;  Laterality: Bilateral;  . [place] SURGERY  2005  . PERCUTANEOUS STENT INTERVENTION  04/20/2013   Procedure: PERCUTANEOUS STENT INTERVENTION;  Surgeon: Angelia Mould, MD;  Location: [facility] CATH LAB;  Service: Cardiovascular;;  right common iliac artery  . PERIPHERAL VASCULAR CATHETERIZATION N/A 01/31/2016   Procedure: Abdominal Aortogram w/Lower Extremity;  Surgeon: Serafina Mitchell, MD;  Location: [place] CV LAB;  Service: Cardiovascular;  Laterality: N/A;  . PERIPHERAL VASCULAR CATHETERIZATION Right 01/31/2016   Procedure: Peripheral Vascular Atherectomy;  Surgeon: Serafina Mitchell, MD;  Location: [place] CV LAB;  Service: Cardiovascular;  Laterality: Right;  Iliac common and external  . PERIPHERAL VASCULAR CATHETERIZATION Right 01/31/2016   Procedure: Peripheral Vascular Intervention;  Surgeon: Serafina Mitchell, MD;  Location: [place] CV LAB;  Service: Cardiovascular;  Laterality: Right;  external iliac and common iliac  . PERIPHERAL VASCULAR CATHETERIZATION N/A 04/24/2016   Procedure: Abdominal Aortogram w/Lower Extremity;  Surgeon: Serafina Mitchell, MD;  Location: [place] CV LAB;  Service: Cardiovascular;  Laterality: N/A;  . PERIPHERAL VASCULAR CATHETERIZATION Right 04/24/2016   Procedure: Peripheral Vascular Intervention;  Surgeon: Serafina Mitchell, MD;  Location: [place] CV LAB;  Service: Cardiovascular;  Laterality: Right;  external illiac Rt  . SPINE SURGERY    . VASCULAR SURGERY          Home Medications    Prior to Admission medications   Medication Sig Start Date End Date Taking? Authorizing Provider  albuterol (PROVENTIL) (2.5 MG/3ML) 0.083% nebulizer  solution Take 2.5 mg by nebulization every 6 (six) hours as needed for wheezing or shortness of breath.    [provider]  clopidogrel (PLAVIX) 75 MG tablet TAKE 1 TABLET BY MOUTH DAILY. Patient taking differently: TAKE 75 MG BY MOUTH DAILY. 05/28/17   Waynetta Sandy, MD  digoxin (LANOXIN) 0.125 MG tablet Take 0.125 mg by mouth daily.     [provider]  famotidine (PEPCID) 20 MG tablet One at bedtime Patient taking differently: Take 20 mg by mouth at bedtime. One at bedtime 04/26/17   Tanda Rockers, MD  fluticasone furoate-vilanterol (BREO ELLIPTA) 100-25 MCG/INH AEPB Inhale 1 puff into the lungs daily.    [provider]  furosemide (LASIX) 20 MG tablet Take 1 tablet (20 mg total) by mouth daily. 10/27/13   Tanna Furry, MD  metoprolol tartrate (LOPRESSOR) 25 MG tablet Take 0.5 tablets (12.5 mg total) by mouth 2 (two) times daily. 09/07/13   Samuella Cota, MD  pantoprazole (PROTONIX) 40 MG tablet Take 40 mg by mouth daily.    [provider]  pentoxifylline (TRENTAL) 400 MG CR tablet Take 1 tablet (400 mg total) by mouth 3 (three) times daily with meals. 06/25/17   Serafina Mitchell, MD  sitaGLIPtin (JANUVIA) 100 MG tablet Take 100 mg by mouth daily.  [provider]    Family History Family History  Problem Relation Age of Onset  . Stroke Mother 72  . Hypertension Mother   . Cancer Sister   . Heart disease Sister   . Heart disease Brother        Heart Disease before age 63  . Heart attack Brother   . Cancer Sister   . Cancer Brother     Social History Social History   Tobacco Use  . Smoking status: Former Smoker    Packs/day: 0.30    Years: 40.00    Pack years: 12.00    Types: Cigarettes    Last attempt to quit: 03/26/1993    Years since quitting: 24.3  . Smokeless tobacco: Never Used  Substance Use Topics  . Alcohol use: No  . Drug use: No     Allergies   Patient has no known allergies.   Review of  Systems Review of Systems Level 5 caveat because of dementia.   Physical Exam Updated Vital Signs BP (!) 177/68 (BP Location: Right Arm)   Pulse 70   Temp 97.7 F (36.5 C) (Oral)   Resp 16   SpO2 100%   Physical Exam  Constitutional: He appears well-developed and well-nourished. No distress.  HENT:  Head: Normocephalic and atraumatic.  Eyes: Conjunctivae are normal. Right eye exhibits no discharge. Left eye exhibits no discharge.  Neck: Neck supple.  Cardiovascular: Normal rate, regular rhythm and normal heart sounds. Exam reveals no gallop and no friction rub.  No murmur heard. Pulmonary/Chest: Effort normal and breath sounds normal. No respiratory distress.  Abdominal: Soft. He exhibits no distension. There is no tenderness.  Musculoskeletal: He exhibits edema. He exhibits no tenderness.  Neurological: He is alert.  Skin: Skin is warm and dry.  Psychiatric:  Pleasantly confused.   Nursing note and vitals reviewed.    ED Treatments / Results  Labs (all labs ordered are listed, but only abnormal results are displayed) Labs Reviewed  URINALYSIS, ROUTINE W REFLEX MICROSCOPIC - Abnormal; Notable for the following components:      Result Value   APPearance CLOUDY (*)    Hgb urine dipstick MODERATE (*)    Protein, ur 100 (*)    Leukocytes, UA LARGE (*)    WBC, UA >50 (*)    Bacteria, UA FEW (*)    All other components within normal limits  URINE CULTURE    EKG None  Radiology No results found.  Procedures Procedures (including critical care time)  Medications Ordered in ED Medications - No data to display   Initial Impression / Assessment and Plan / ED Course  I have reviewed the triage vital signs and the nursing notes.  Pertinent labs & imaging results that were available during my care of the patient were reviewed by me and considered in my medical decision making (see chart for details).     83 year old male with increased urinary frequency and  recent falls.  UA is consistent with UTI.  He is afebrile.  Tolerating p.o.  I feel is appropriate for outpatient treatment at this time.  We will give a dose of IM Rocephin in the emergency room.  Continued Keflex as an outpatient.    Final Clinical Impressions(s) / ED Diagnoses   Final diagnoses:  Acute cystitis without hematuria    ED Discharge Orders    None       Virgel Manifold, MD 07/21/17 1901

## 2017-07-21 NOTE — Discharge Instructions (Signed)
You do have a urinary tract infection. This is likely why you are urinating so frequently and could also be contributing to the increased falls. You were given an antibiotic shot in the ER today. Continue taking antibiotics starting tomorrow morning until finished. I expect your symptoms to progressively improve as the infection is treated. If symptoms do not begin to improve or worsen then I want you to be re-evaluated.

## 2017-07-21 NOTE — ED Triage Notes (Signed)
Per wife pt had leg surgery on April 2nd and reports that he has had multiple falls since then. Reports that during day he just dripples when urinates so he wears a depends. Reports that during the night he gets up and down all night long bc going to bathroom but just dripples. Pt has dementia as well.

## 2017-07-21 NOTE — ED Notes (Signed)
Pt given food with MD permission. Pt given water, a sandwich, soup, and a cheese stick.

## 2017-07-21 NOTE — ED Notes (Signed)
NT reported that pt had 320 ml's in his bladder

## 2017-07-23 LAB — URINE CULTURE

## 2017-07-29 DIAGNOSIS — N182 Chronic kidney disease, stage 2 (mild): Secondary | ICD-10-CM | POA: Diagnosis not present

## 2017-07-29 DIAGNOSIS — I739 Peripheral vascular disease, unspecified: Secondary | ICD-10-CM | POA: Diagnosis not present

## 2017-07-29 DIAGNOSIS — E1122 Type 2 diabetes mellitus with diabetic chronic kidney disease: Secondary | ICD-10-CM | POA: Diagnosis not present

## 2017-07-29 DIAGNOSIS — N08 Glomerular disorders in diseases classified elsewhere: Secondary | ICD-10-CM | POA: Diagnosis not present

## 2017-07-30 ENCOUNTER — Other Ambulatory Visit: Payer: Self-pay | Admitting: *Deleted

## 2017-07-30 ENCOUNTER — Encounter: Payer: Self-pay | Admitting: *Deleted

## 2017-07-30 NOTE — Patient Outreach (Signed)
[facility]) Care Management  07/30/2017  [name] 12/22/1934 [id number]  Referral via [facility] UM-reason-Patient falls often; has dementia. Medications copays expensive. Wife express concerns about patient needing continuous care.   Referral from Nurse call center: wife called reporting patient having breathing difficulty (hx COPD), States BP 196/84. States leg swollen & painful . Spouse was given instructions to go to ED and see physician within 24 hours.  Telephone call attempt to caregiver/spouse; left HIPPA voice mail requesting call back.  Plan: Follow up call. Geophysicist/field seismologist.  Sherrin Daisy, RN BSN [place] Management Coordinator [facility] Care Management  [phone]

## 2017-08-05 ENCOUNTER — Telehealth: Payer: Self-pay | Admitting: Internal Medicine

## 2017-08-05 ENCOUNTER — Encounter: Payer: Self-pay | Admitting: Surgery

## 2017-08-05 ENCOUNTER — Ambulatory Visit (INDEPENDENT_AMBULATORY_CARE_PROVIDER_SITE_OTHER): Payer: PPO | Admitting: Surgery

## 2017-08-05 ENCOUNTER — Other Ambulatory Visit: Payer: Self-pay

## 2017-08-05 ENCOUNTER — Other Ambulatory Visit: Payer: Self-pay | Admitting: Surgery

## 2017-08-05 VITALS — BP 140/69 | HR 64 | Temp 97.0°F | Resp 16 | Ht 66.0 in | Wt 155.0 lb

## 2017-08-05 DIAGNOSIS — I779 Disorder of arteries and arterioles, unspecified: Secondary | ICD-10-CM

## 2017-08-05 DIAGNOSIS — F0391 Unspecified dementia with behavioral disturbance: Secondary | ICD-10-CM

## 2017-08-05 DIAGNOSIS — I739 Peripheral vascular disease, unspecified: Secondary | ICD-10-CM

## 2017-08-05 MED ORDER — FLUTICASONE FUROATE-VILANTEROL 100-25 MCG/INH IN AEPB: 1.0000 | INHALATION_SPRAY | Freq: Every day | RESPIRATORY_TRACT | 0 refills | Status: DC

## 2017-08-05 NOTE — Progress Notes (Signed)
Vascular and Vein Specialist of [facility]  Patient name: [name] MRN: [id number] DOB: 27-Feb-1935 Sex: male   REASON FOR VISIT:    Follow up  HISOTRY OF PRESENT ILLNESS:    [name] is a 83 y.o. male returns today for follow-up.  He has a history of a right to left femoral-femoral bypass graft as well as bilateral femoral-popliteal bypass grafts, all done by Dr. Kellie Simmering. -- November 2017 he went for arteriogram and is status post atherectomy and drug coated balloon angioplasty of the right iliac artery and subsequent stenting of a nonflow limiting dissection. Marland Kitchen  --On 04/24/2016 he underwent angiography and he was found to have significant stenosis in the external iliac artery below the previously deployed stent.  This was primarily stented.  The femoral-femoral bypass graft was widely patent --11-20-2016:  DCB right EIA, Right CIA 06-25-2017:  Diagnostic angiogram for u/s stenosis with 50% stenosis of right iliac and 505 stenosis of the anastamotic stenosis which should eb treated medically or surgically   He is here today with his wife and son.  He is starting to fall more and is waking up multiple times during the night screaming.  He is scratching his legs to where they will bleed.  PAST MEDICAL HISTORY:   Past Medical History:  Diagnosis Date  . Alzheimer's dementia 2013  . Anemia    HX OF ANEMIA  . CHF (congestive heart failure) ([place])   . Colon cancer ([place])   . COPD (chronic obstructive pulmonary disease) ([place])   . Depression   . Dyspnea   . Hyperlipidemia   . Hypertension   . Pneumonia 2014  . PVD (peripheral vascular disease) ([place])   . Type II diabetes mellitus ([place])      FAMILY HISTORY:   Family History  Problem Relation Age of Onset  . Stroke Mother 65  . Hypertension Mother   . Cancer Sister   . Heart disease Sister   . Heart disease Brother        Heart Disease before age 62  . Heart attack Brother   . Cancer  Sister   . Cancer Brother     SOCIAL HISTORY:   Social History   Tobacco Use  . Smoking status: Former Smoker    Packs/day: 0.30    Years: 40.00    Pack years: 12.00    Types: Cigarettes    Last attempt to quit: 03/26/1993    Years since quitting: 24.3  . Smokeless tobacco: Never Used  Substance Use Topics  . Alcohol use: No     ALLERGIES:   No Known Allergies   CURRENT MEDICATIONS:   Current Outpatient Medications  Medication Sig Dispense Refill  . albuterol (PROVENTIL) (2.5 MG/3ML) 0.083% nebulizer solution Take 2.5 mg by nebulization every 6 (six) hours as needed for wheezing or shortness of breath.    . clopidogrel (PLAVIX) 75 MG tablet TAKE 1 TABLET BY MOUTH DAILY. (Patient taking differently: TAKE 75 MG BY MOUTH DAILY.) 90 tablet 3  . digoxin (LANOXIN) 0.125 MG tablet Take 0.125 mg by mouth daily.     Marland Kitchen docusate sodium (COLACE) 100 MG capsule Take 1 capsule (100 mg total) by mouth 2 (two) times daily as needed for mild constipation. 30 capsule 0  . famotidine (PEPCID) 20 MG tablet One at bedtime (Patient taking differently: Take 20 mg by mouth daily. One at bedtime) 90 tablet 1  . fluticasone furoate-vilanterol (BREO ELLIPTA) 100-25 MCG/INH AEPB Inhale 1 puff into  the lungs daily.    . fluticasone furoate-vilanterol (BREO ELLIPTA) 100-25 MCG/INH AEPB Inhale 1 puff into the lungs daily. 1 each 0  . furosemide (LASIX) 20 MG tablet Take 1 tablet (20 mg total) by mouth daily. 5 tablet 0  . metoprolol tartrate (LOPRESSOR) 25 MG tablet Take 0.5 tablets (12.5 mg total) by mouth 2 (two) times daily. 30 tablet 0  . naproxen sodium (ALEVE) 220 MG tablet Take 220 mg by mouth daily as needed (pain).    . pravastatin (PRAVACHOL) 80 MG tablet Take 80 mg by mouth daily.  1  . sitaGLIPtin (JANUVIA) 100 MG tablet Take 100 mg by mouth daily.    . traMADol (ULTRAM) 50 MG tablet Take 1 tablet (50 mg total) by mouth every 6 (six) hours as needed. 6 tablet 0   No current  facility-administered medications for this visit.     REVIEW OF SYSTEMS:   [X]  denotes positive finding, [ ]  denotes negative finding Cardiac  Comments:  Chest pain or chest pressure:    Shortness of breath upon exertion: x   Short of breath when lying flat:    Irregular heart rhythm:        Vascular    Pain in calf, thigh, or hip brought on by ambulation: x   Pain in feet at night that wakes you up from your sleep:  x   Blood clot in your veins:    Leg swelling:         Pulmonary    Oxygen at home:    Productive cough:     Wheezing:  x       Neurologic    Sudden weakness in arms or legs:  x   Sudden numbness in arms or legs:  x   Sudden onset of difficulty speaking or slurred speech:    Temporary loss of vision in one eye:     Problems with dizziness:         Gastrointestinal    Blood in stool:     Vomited blood:         Genitourinary    Burning when urinating:     Blood in urine:        Psychiatric    Major depression:         Hematologic    Bleeding problems:    Problems with blood clotting too easily:        Skin    Rashes or ulcers:        Constitutional    Fever or chills:      PHYSICAL EXAM:   Vitals:   08/05/17 1410  BP: 140/69  Pulse: 64  Resp: 16  Temp: (!) 97 F (36.1 C)  TempSrc: Oral  SpO2: 100%  Weight: 155 lb (70.3 kg)  Height: 5\' 6"  (1.676 m)    GENERAL: The patient is a well-nourished male, in no acute distress. The vital signs are documented above. CARDIAC: There is a regular rate and rhythm.  PULMONARY: Non-labored respirations MUSCULOSKELETAL: There are no major deformities or cyanosis. NEUROLOGIC: No focal weakness or paresthesias are detected. SKIN: There are no ulcers or rashes noted. PSYCHIATRIC: The patient has a normal affect.  STUDIES:   none  MEDICAL ISSUES:   We discussed multiple options today.  First we are going to try and touch base with home health and social work to see what his disposition options are  given his advanced dementia.  I think he is exceeding his family's  capabilities of taking care of him and needs to be transition to a long-term facility.  He is having persistent leg pain.  This potentially is related to his poor circulation, however he does not have any straightforward options for revascularization.  He has known occlusion of bilateral femoral-popliteal bypass grafts.  If his leg pain continues to persist or gets worse, as a heroic measure we could consider intervening in the right groin anastomotic area as well as his iliac stents and then considering trying to open his right superficial femoral artery.  I have the patient scheduled to follow-up with me in 3 months to check on his status.  The wife may contact me if he gets worse before then and he could be scheduled for angiography without a follow-up visit    [name], MD Vascular and Vein Specialists of [facility] [phone] Pager [phone]

## 2017-08-05 NOTE — Telephone Encounter (Signed)
Pt supplied with a sample of breo.  Nothing further needed.

## 2017-08-12 DIAGNOSIS — G309 Alzheimer's disease, unspecified: Secondary | ICD-10-CM | POA: Diagnosis not present

## 2017-08-12 DIAGNOSIS — I739 Peripheral vascular disease, unspecified: Secondary | ICD-10-CM | POA: Diagnosis not present

## 2017-08-12 DIAGNOSIS — I1310 Hypertensive heart and chronic kidney disease without heart failure, with stage 1 through stage 4 chronic kidney disease, or unspecified chronic kidney disease: Secondary | ICD-10-CM | POA: Diagnosis not present

## 2017-08-12 DIAGNOSIS — N08 Glomerular disorders in diseases classified elsewhere: Secondary | ICD-10-CM | POA: Diagnosis not present

## 2017-08-13 ENCOUNTER — Other Ambulatory Visit: Payer: Self-pay | Admitting: *Deleted

## 2017-08-13 DIAGNOSIS — Z87891 Personal history of nicotine dependence: Secondary | ICD-10-CM | POA: Diagnosis not present

## 2017-08-13 DIAGNOSIS — J449 Chronic obstructive pulmonary disease, unspecified: Secondary | ICD-10-CM | POA: Diagnosis not present

## 2017-08-13 DIAGNOSIS — Z9181 History of falling: Secondary | ICD-10-CM | POA: Diagnosis not present

## 2017-08-13 DIAGNOSIS — F329 Major depressive disorder, single episode, unspecified: Secondary | ICD-10-CM | POA: Diagnosis not present

## 2017-08-13 DIAGNOSIS — F0280 Dementia in other diseases classified elsewhere without behavioral disturbance: Secondary | ICD-10-CM | POA: Diagnosis not present

## 2017-08-13 DIAGNOSIS — Z87440 Personal history of urinary (tract) infections: Secondary | ICD-10-CM | POA: Diagnosis not present

## 2017-08-13 DIAGNOSIS — I509 Heart failure, unspecified: Secondary | ICD-10-CM | POA: Diagnosis not present

## 2017-08-13 DIAGNOSIS — E1151 Type 2 diabetes mellitus with diabetic peripheral angiopathy without gangrene: Secondary | ICD-10-CM | POA: Diagnosis not present

## 2017-08-13 DIAGNOSIS — I110 Hypertensive heart disease with heart failure: Secondary | ICD-10-CM | POA: Diagnosis not present

## 2017-08-13 DIAGNOSIS — Z85038 Personal history of other malignant neoplasm of large intestine: Secondary | ICD-10-CM | POA: Diagnosis not present

## 2017-08-13 DIAGNOSIS — G309 Alzheimer's disease, unspecified: Secondary | ICD-10-CM | POA: Diagnosis not present

## 2017-08-13 NOTE — Patient Outreach (Signed)
[name]) Care Management  08/13/2017  [name] Sep 01, 1934 [id number]  Referral via [facility] UM-reason-Patient falls often; has dementia. Medications copays expensive. Wife express concerns about patient needing continuous care.  Telephone call to patient. Call was answered by spouse/caregiver [name] who voices that patient has Alzheimer's and that she has power of attorney and answers all of patient calls. Spouse was advised of reason for call & of [facility] care management services.  HIPPA verification received from spouse.   Spouse states patient was diagnosed with Alzheimer's 10 years ago. Also has PAD with 4 stents in legs. Hx bypass surgery on legs. States he is currently not a candidate for surgery.    Caregiver states patient falls a lot due to problem with legs. States he uses walker but needs assistance while using walker due to unsteadiness. Voices if he falls she is unable to get him up but needs to call for help. States she has health issues also. Voices that patient has 10 hrs of aid services which is 2 hrs a day Monday thru Friday. States they assist him with  personal care & clean his bedroom.. States patient goes to Adult day care at [facility] from 8:00 am to 3:45 pm. States he is transported by [facility].  States she cares for patient after he gets home from daycare. Voices patient is incontinent and wears depends. States he is up frequently at night going to bathroom and she gets up with him to prevent falls. States she unable to sleep due patient's frequent trips to bathroom. States patient will not use potty chair that is in bathroom.  States daughter lives out of town but usually able to come every other weekend to assist with shopping, getting prescriptions filled, etc..   Spouse/caregiver states she manages patient's medications and administers. States at times he will not take medications. States she also takes patient to MD appointments and there are times that  she cannot persuade patient to go & appointment has to cancelled.   Caregiver Raechel Chute is concerned that he my not be able to stay at day care due to increased level of care that is required for her husband(patient).  States she is interested in looking at long term care. Voices that she applied for Medicaid recently but was turned down. States she does not think she has long term policy.   States she has been to classes of Alzheimer's  and understands the progression of her patient's disease.    Spouse/caregiver consents to [place] Work services for [employer], possible respite care, information on long term care, food pantry. Spouse declines services of RN case Freight forwarder.  Plan:  Will refer to Clinical Education officer, museum.    Sherrin Daisy, RN BSN [place] Management Coordinator [facility] Care Management  [phone]

## 2017-08-16 DIAGNOSIS — E1151 Type 2 diabetes mellitus with diabetic peripheral angiopathy without gangrene: Secondary | ICD-10-CM | POA: Diagnosis not present

## 2017-08-16 DIAGNOSIS — Z87440 Personal history of urinary (tract) infections: Secondary | ICD-10-CM | POA: Diagnosis not present

## 2017-08-16 DIAGNOSIS — J449 Chronic obstructive pulmonary disease, unspecified: Secondary | ICD-10-CM | POA: Diagnosis not present

## 2017-08-16 DIAGNOSIS — G309 Alzheimer's disease, unspecified: Secondary | ICD-10-CM | POA: Diagnosis not present

## 2017-08-16 DIAGNOSIS — F329 Major depressive disorder, single episode, unspecified: Secondary | ICD-10-CM | POA: Diagnosis not present

## 2017-08-16 DIAGNOSIS — Z85038 Personal history of other malignant neoplasm of large intestine: Secondary | ICD-10-CM | POA: Diagnosis not present

## 2017-08-16 DIAGNOSIS — I509 Heart failure, unspecified: Secondary | ICD-10-CM | POA: Diagnosis not present

## 2017-08-16 DIAGNOSIS — F0280 Dementia in other diseases classified elsewhere without behavioral disturbance: Secondary | ICD-10-CM | POA: Diagnosis not present

## 2017-08-16 DIAGNOSIS — I110 Hypertensive heart disease with heart failure: Secondary | ICD-10-CM | POA: Diagnosis not present

## 2017-08-16 DIAGNOSIS — Z9181 History of falling: Secondary | ICD-10-CM | POA: Diagnosis not present

## 2017-08-16 DIAGNOSIS — Z87891 Personal history of nicotine dependence: Secondary | ICD-10-CM | POA: Diagnosis not present

## 2017-08-20 ENCOUNTER — Other Ambulatory Visit: Payer: Self-pay

## 2017-08-20 NOTE — Patient Outreach (Signed)
[place] [facility]) Care Management  08/20/2017  HILMER ALIBERTI Mar 11, 1935 [id number]  Telephone call to patient's wife to follow up on referral for community resources. No answer. HIPAA compliant voice message left requesting a return call.  Plan: BSW will send a letter and attempt patient again within four business days.  Daneen Schick, Arita Miss, CDP Social Worker [phone]

## 2017-08-22 ENCOUNTER — Other Ambulatory Visit: Payer: Self-pay

## 2017-08-22 ENCOUNTER — Ambulatory Visit: Payer: Self-pay

## 2017-08-22 NOTE — Patient Outreach (Signed)
[facility]) Care Management  08/22/2017  [name] Sep 05, 1934 [id number]   Return call to this BSW from patients wife. HIPAA identifiers verified. Patients wife, [name], reports large concern with patients increase in falls. Patient has advanced demtnia and is up multiple times a night. [name] has purchased a floor mat and alarm to help alert her that patient is up. However, if/when patient falls [name] is unable to help him off the floor.  [name] reports she has a neighbor that can come help her but they are not always available. [name] states that if patient falls at 2 in the morning she is not able to call her neightbor for help so she is faced with two options. Her first option is to leave patient on the floor until the morning. Her second option is to call 911. [name] states that after insurance, she is charged [phone] each time EMS comes to the home to assist with helping patient off the floor.   [name] states she is interested in placement of patient due to inability to manage at home safely. [name] states "I just can't take care of him anymore". Both patient and his wife have been retired since 1996. [name] discloses no "retirement checks". [name] has recently canceled their cable subscription due to inability to afford. [name] visits a church food pantry monthly to assist with nutrition needs. [name] also reports high medication costs stating "my check goes to pay for his medicine". [name] agreeable to pharmacy referral.  [name] did apply for Medicaid earlier this year. She states she is confused because she was told they were approved. However, a few days later, [name] reports receiving a letter that the claim was denied. Combined income is well over the [facility] eligibility limit. Patients individual income is also over the limit to apply for CAP. [name] was able to provide this BSW her Medicaid caseworkers name. BSW plans to contact "[name]" to follow-up on patients Medicaid eligibility and  inquire if patient eligible for Medicaid once placed in long-term care.   Plan: BSW to refer patient to pharmacy due to difficulty affording meds BSW to follow up with patients wife in the next two weeks.  Daneen Schick, Arita Miss, CDP Social Worker [phone]

## 2017-08-22 NOTE — Patient Outreach (Signed)
[facility]) Care Management  08/22/2017  LOYALTY BRASHIER 11-08-1934 [id number]  BSW attempted to contact patients wife in an effort to follow-up on referral for long-term planning. BSW called both home and mobile numbers listed for wife, unfortunately both calls were unsuccessful. BSW left a HIPAA compliant voice message requesting a return call. BSW to attempt outreach again in the next four business days.  Daneen Schick, Arita Miss, CDP Social Worker [phone]

## 2017-08-26 ENCOUNTER — Ambulatory Visit: Payer: Self-pay

## 2017-08-26 ENCOUNTER — Telehealth: Payer: Self-pay | Admitting: Pharmacist

## 2017-08-26 ENCOUNTER — Other Ambulatory Visit: Payer: Self-pay | Admitting: Pharmacy Technician

## 2017-08-26 ENCOUNTER — Encounter: Payer: Self-pay | Admitting: Pharmacist

## 2017-08-26 NOTE — Patient Outreach (Signed)
[facility]) Care Management  [place]   08/26/2017  [name] 1935/03/20 [id number]  Subjective: Patient was called regarding medication assistance.  Spoke with his wife [name]) as the patient has dementia and she is his caregiver.  HIPAA identifiers were obtained. Patient is an 83 year old male with multiple medical conditions including but not limited to:  Hypertension, type 2 diabetes, hyperlipidemia, COPD, PAD, CHF, and dementia.  Patient's wife manages his medications. She expressed concern over paying for Januvia and Breo.   Objective:   Encounter Medications: Outpatient Encounter Medications as of 08/26/2017  Medication Sig  . acetaminophen (TYLENOL) 500 MG tablet Take 500 mg by mouth daily.  Marland Kitchen albuterol (PROVENTIL) (2.5 MG/3ML) 0.083% nebulizer solution Take 2.5 mg by nebulization every 6 (six) hours as needed for wheezing or shortness of breath.  . clopidogrel (PLAVIX) 75 MG tablet TAKE 1 TABLET BY MOUTH DAILY. (Patient taking differently: TAKE 75 MG BY MOUTH DAILY.)  . digoxin (LANOXIN) 0.125 MG tablet Take 0.125 mg by mouth daily.   . famotidine (PEPCID) 20 MG tablet One at bedtime (Patient taking differently: Take 20 mg by mouth daily. One at bedtime)  . fluticasone furoate-vilanterol (BREO ELLIPTA) 100-25 MCG/INH AEPB Inhale 1 puff into the lungs daily.  . furosemide (LASIX) 20 MG tablet Take 1 tablet (20 mg total) by mouth daily.  . metoprolol tartrate (LOPRESSOR) 25 MG tablet Take 0.5 tablets (12.5 mg total) by mouth 2 (two) times daily.  . naproxen sodium (ALEVE) 220 MG tablet Take 220 mg by mouth daily as needed (pain).  . pravastatin (PRAVACHOL) 80 MG tablet Take 80 mg by mouth daily.  . sitaGLIPtin (JANUVIA) 100 MG tablet Take 100 mg by mouth daily.  . [DISCONTINUED] docusate sodium (COLACE) 100 MG capsule Take 1 capsule (100 mg total) by mouth 2 (two) times daily as needed for mild constipation. (Patient not taking: Reported on 08/26/2017)   . [DISCONTINUED] fluticasone furoate-vilanterol (BREO ELLIPTA) 100-25 MCG/INH AEPB Inhale 1 puff into the lungs daily.  . [DISCONTINUED] traMADol (ULTRAM) 50 MG tablet Take 1 tablet (50 mg total) by mouth every 6 (six) hours as needed. (Patient not taking: Reported on 08/26/2017)   No facility-administered encounter medications on file as of 08/26/2017.     Functional Status: In your present state of health, do you have any difficulty performing the following activities: 06/25/2017 06/25/2017  Hearing? N -  Vision? N -  Difficulty concentrating or making decisions? Y -  Walking or climbing stairs? Y -  Dressing or bathing? Y -  Doing errands, shopping? - N  Some recent data might be hidden    Fall/Depression Screening: Fall Risk  08/13/2017 05/20/2017 04/05/2015  Falls in the past year? Yes Yes No  Number falls in past yr: 2 or more 2 or more -  Injury with Fall? No No -  Risk Factor Category  High Fall Risk - -  Risk for fall due to : History of fall(s);Impaired balance/gait;Mental status change - Impaired balance/gait;Mental status change;Impaired mobility;History of fall(s)  Follow up - - -   PHQ 2/9 Scores 08/15/2017 05/20/2017 07/12/2015 03/23/2015 03/17/2015 02/14/2015 01/21/2015  PHQ - 2 Score - - 0 0 1 1 0  PHQ- 9 Score - - - 4 6 6  -  Exception Documentation Other- indicate reason in comment box Medical reason - - - - -  Not completed Patient has dementia  unable to assess  - - - - -  Assessment:  Patient's medications were reviewed via telephone with his wife.   Drugs sorted by system:   Cardiovascular: Clopidogrel Digoxin Furosemide Metoprolol Pravastatin  Pulmonary/Allergy: Albuterol nebulizer solution Breo  Gastrointestinal: Famotidine  Endocrine: Januvia  Pain: Acetaminophen Aleve  Medication Assistance Findings:  -after initial financial assessment, patient appears to qualify for both Januvia and Breo patient assistance programs. (Official  decision depends on the program) -patient's wife understands that she will need to send copies of the social security benefit letters -applications will be sent to the patient. -patient has Health [employer]vantage.  TROOP ytd is $521.00 (to qualify for Breo, patient needs to spend $64 -patient's wife was notified of patient's current TROOP    Plan:  Route note to Dr. Melvyn Novas requesting signature of the Breo paperwork Route note to Minette Brine, MD requesting signature of the [place] paperwork Send patient letter to [place], Etter Sjogren to send applications to the patient Follow up in 2 weeks.  Elayne Guerin, PharmD, [place] Clinical Pharmacist [phone]

## 2017-08-26 NOTE — Patient Outreach (Signed)
[facility]) Care Management  08/26/2017  [name] 10-26-1934 [id number]   Received [employer] and [place] patient assistance referral from [place] for [place]. Prepared patient portion of applications to be mailed out. Prepared provider portion of [employer] patient assistance application to be mailed to J. Laurance Flatten FNP-BC and faxed provider portion [place] application Dr. Melvyn Novas.  Will follow up with patient in 5-7 business days to verify receipt of applications.  Maud Deed Ferney, [place] Management [phone]

## 2017-08-28 ENCOUNTER — Ambulatory Visit: Payer: Self-pay

## 2017-08-28 DIAGNOSIS — F0280 Dementia in other diseases classified elsewhere without behavioral disturbance: Secondary | ICD-10-CM | POA: Diagnosis not present

## 2017-08-28 DIAGNOSIS — F329 Major depressive disorder, single episode, unspecified: Secondary | ICD-10-CM | POA: Diagnosis not present

## 2017-08-28 DIAGNOSIS — J449 Chronic obstructive pulmonary disease, unspecified: Secondary | ICD-10-CM | POA: Diagnosis not present

## 2017-08-28 DIAGNOSIS — I509 Heart failure, unspecified: Secondary | ICD-10-CM | POA: Diagnosis not present

## 2017-08-28 DIAGNOSIS — Z87891 Personal history of nicotine dependence: Secondary | ICD-10-CM | POA: Diagnosis not present

## 2017-08-28 DIAGNOSIS — Z87440 Personal history of urinary (tract) infections: Secondary | ICD-10-CM | POA: Diagnosis not present

## 2017-08-28 DIAGNOSIS — Z85038 Personal history of other malignant neoplasm of large intestine: Secondary | ICD-10-CM | POA: Diagnosis not present

## 2017-08-28 DIAGNOSIS — Z9181 History of falling: Secondary | ICD-10-CM | POA: Diagnosis not present

## 2017-08-28 DIAGNOSIS — G309 Alzheimer's disease, unspecified: Secondary | ICD-10-CM | POA: Diagnosis not present

## 2017-08-28 DIAGNOSIS — I110 Hypertensive heart disease with heart failure: Secondary | ICD-10-CM | POA: Diagnosis not present

## 2017-08-28 DIAGNOSIS — E1151 Type 2 diabetes mellitus with diabetic peripheral angiopathy without gangrene: Secondary | ICD-10-CM | POA: Diagnosis not present

## 2017-08-29 ENCOUNTER — Ambulatory Visit: Payer: Self-pay

## 2017-09-02 ENCOUNTER — Other Ambulatory Visit: Payer: Self-pay

## 2017-09-02 DIAGNOSIS — Z85038 Personal history of other malignant neoplasm of large intestine: Secondary | ICD-10-CM | POA: Diagnosis not present

## 2017-09-02 DIAGNOSIS — F329 Major depressive disorder, single episode, unspecified: Secondary | ICD-10-CM | POA: Diagnosis not present

## 2017-09-02 DIAGNOSIS — Z9181 History of falling: Secondary | ICD-10-CM | POA: Diagnosis not present

## 2017-09-02 DIAGNOSIS — I771 Stricture of artery: Secondary | ICD-10-CM

## 2017-09-02 DIAGNOSIS — I509 Heart failure, unspecified: Secondary | ICD-10-CM | POA: Diagnosis not present

## 2017-09-02 DIAGNOSIS — E1151 Type 2 diabetes mellitus with diabetic peripheral angiopathy without gangrene: Secondary | ICD-10-CM | POA: Diagnosis not present

## 2017-09-02 DIAGNOSIS — I739 Peripheral vascular disease, unspecified: Secondary | ICD-10-CM

## 2017-09-02 DIAGNOSIS — F0280 Dementia in other diseases classified elsewhere without behavioral disturbance: Secondary | ICD-10-CM | POA: Diagnosis not present

## 2017-09-02 DIAGNOSIS — I779 Disorder of arteries and arterioles, unspecified: Secondary | ICD-10-CM

## 2017-09-02 DIAGNOSIS — Z87891 Personal history of nicotine dependence: Secondary | ICD-10-CM | POA: Diagnosis not present

## 2017-09-02 DIAGNOSIS — J449 Chronic obstructive pulmonary disease, unspecified: Secondary | ICD-10-CM | POA: Diagnosis not present

## 2017-09-02 DIAGNOSIS — G309 Alzheimer's disease, unspecified: Secondary | ICD-10-CM | POA: Diagnosis not present

## 2017-09-02 DIAGNOSIS — I110 Hypertensive heart disease with heart failure: Secondary | ICD-10-CM | POA: Diagnosis not present

## 2017-09-02 DIAGNOSIS — Z87440 Personal history of urinary (tract) infections: Secondary | ICD-10-CM | POA: Diagnosis not present

## 2017-09-04 ENCOUNTER — Ambulatory Visit: Payer: Self-pay

## 2017-09-04 ENCOUNTER — Other Ambulatory Visit: Payer: Self-pay

## 2017-09-04 NOTE — Patient Outreach (Signed)
[facility]) Care Management  09/04/2017  PAGE LANCON 01/10/1935 [id number]   BSW attempted to contact patients wife, [name], in an effort to follow-up on options for long term placement. Unfortunately [name] did not answer. BSW left a  HIPAA compliant voice message requesting a return call.  Plan: BSW will attempt contact in the next 3-4 days if [name] does not return this BSW's call.   Daneen Schick, Arita Miss, CDP Social Worker [phone]

## 2017-09-04 NOTE — Patient Outreach (Signed)
[place] [facility]) Care Management  09/04/2017  [name] [birth year] [id number]  BSW received a return call from patients wife, [name]. [name] able to verify HIPAA. [name] stated patient has been in respite since Friday but she plans to go pick him up today. [name] states patient is supposed to stay longer but her has a vascular appointment tomorrow and she wants to make sure he does not miss the appointment. [name] unable to recall name of respite center but states "at home in [place]".   BSW spoke with [name] about long-term care options and possibility of involving CSW with case to assist with placement. [name] became confused stating she thought this BSW already spoke with her today about an FL-2. [name] was able to confirm that she has been working with a Education officer, museum through [employer] in efforts to place patient. [name] stated the social worker had discussed placement "off [address]. It has long-term care and memory care". The [facility] social worker has obtained an [place] from patients primary physician.  Due to patient being assisted by [employer], BSW will perform discipline closure. BSW provided contact information to patients wife if she feels she needs assistance in the future. BSW explained that [place] Management pharmacy team would continue to be involved with patient. BSW will update other [facility] Care Management team members of discipline closure.  Daneen Schick, Arita Miss, CDP Social Worker [phone]

## 2017-09-06 ENCOUNTER — Ambulatory Visit: Payer: Self-pay

## 2017-09-09 ENCOUNTER — Other Ambulatory Visit: Payer: Self-pay | Admitting: Internal Medicine

## 2017-09-09 ENCOUNTER — Other Ambulatory Visit: Payer: Self-pay | Admitting: Pharmacist

## 2017-09-09 DIAGNOSIS — I739 Peripheral vascular disease, unspecified: Secondary | ICD-10-CM | POA: Diagnosis not present

## 2017-09-09 DIAGNOSIS — R350 Frequency of micturition: Secondary | ICD-10-CM | POA: Diagnosis not present

## 2017-09-09 NOTE — Patient Outreach (Signed)
[place] [facility]) Care Management  09/09/2017  [name] 1934-04-01 [id number]   Patient was called regarding medication assistance. Mrs. Millspaugh answered the phone. HIPAA identifiers were obtained. Mrs. Dutton is on the patient's release forms.    Mrs. Hubbert confirmed they received the patient assistance forms we mailed to them and said she will complete them and mail them back today.  Patient was sent the following applications: Breo and Januvia  made by [employer] and [employer] and prescribed by Dr. Melvyn Novas [facility]) and Dr. Doreene Burke [facility]).    Plan: Wait to receive the forms. Note will be routed to Etter Sjogren, CPhT Caryl Pina will follow the patient.  Elayne Guerin, PharmD, [place] Clinical Pharmacist [phone]

## 2017-09-17 ENCOUNTER — Other Ambulatory Visit: Payer: Self-pay

## 2017-09-17 ENCOUNTER — Encounter (HOSPITAL_COMMUNITY): Payer: Self-pay

## 2017-09-17 ENCOUNTER — Emergency Department (HOSPITAL_COMMUNITY)
Admission: EM | Admit: 2017-09-17 | Discharge: 2017-09-17 | Disposition: A | Discharge status: Home / Self Care | Payer: PPO | Source: Home / Self Care | Attending: Emergency Medicine | Admitting: Emergency Medicine

## 2017-09-17 DIAGNOSIS — E109 Type 1 diabetes mellitus without complications: Secondary | ICD-10-CM | POA: Insufficient documentation

## 2017-09-17 DIAGNOSIS — Z4682 Encounter for fitting and adjustment of non-vascular catheter: Secondary | ICD-10-CM | POA: Diagnosis not present

## 2017-09-17 DIAGNOSIS — Z85038 Personal history of other malignant neoplasm of large intestine: Secondary | ICD-10-CM | POA: Insufficient documentation

## 2017-09-17 DIAGNOSIS — F0280 Dementia in other diseases classified elsewhere without behavioral disturbance: Secondary | ICD-10-CM

## 2017-09-17 DIAGNOSIS — R339 Retention of urine, unspecified: Secondary | ICD-10-CM | POA: Diagnosis not present

## 2017-09-17 DIAGNOSIS — I739 Peripheral vascular disease, unspecified: Secondary | ICD-10-CM | POA: Diagnosis not present

## 2017-09-17 DIAGNOSIS — R2681 Unsteadiness on feet: Secondary | ICD-10-CM | POA: Diagnosis not present

## 2017-09-17 DIAGNOSIS — R262 Difficulty in walking, not elsewhere classified: Secondary | ICD-10-CM | POA: Diagnosis not present

## 2017-09-17 DIAGNOSIS — K6389 Other specified diseases of intestine: Secondary | ICD-10-CM | POA: Diagnosis not present

## 2017-09-17 DIAGNOSIS — Z823 Family history of stroke: Secondary | ICD-10-CM | POA: Diagnosis not present

## 2017-09-17 DIAGNOSIS — I5022 Chronic systolic (congestive) heart failure: Secondary | ICD-10-CM | POA: Insufficient documentation

## 2017-09-17 DIAGNOSIS — R0989 Other specified symptoms and signs involving the circulatory and respiratory systems: Secondary | ICD-10-CM | POA: Diagnosis not present

## 2017-09-17 DIAGNOSIS — Z7901 Long term (current) use of anticoagulants: Secondary | ICD-10-CM | POA: Insufficient documentation

## 2017-09-17 DIAGNOSIS — E119 Type 2 diabetes mellitus without complications: Secondary | ICD-10-CM | POA: Diagnosis not present

## 2017-09-17 DIAGNOSIS — N179 Acute kidney failure, unspecified: Secondary | ICD-10-CM | POA: Diagnosis not present

## 2017-09-17 DIAGNOSIS — G309 Alzheimer's disease, unspecified: Secondary | ICD-10-CM

## 2017-09-17 DIAGNOSIS — R1084 Generalized abdominal pain: Secondary | ICD-10-CM | POA: Diagnosis not present

## 2017-09-17 DIAGNOSIS — R140 Abdominal distension (gaseous): Secondary | ICD-10-CM | POA: Diagnosis not present

## 2017-09-17 DIAGNOSIS — Z79899 Other long term (current) drug therapy: Secondary | ICD-10-CM

## 2017-09-17 DIAGNOSIS — K56600 Partial intestinal obstruction, unspecified as to cause: Secondary | ICD-10-CM | POA: Diagnosis not present

## 2017-09-17 DIAGNOSIS — K56609 Unspecified intestinal obstruction, unspecified as to partial versus complete obstruction: Secondary | ICD-10-CM | POA: Diagnosis not present

## 2017-09-17 DIAGNOSIS — R52 Pain, unspecified: Secondary | ICD-10-CM | POA: Diagnosis not present

## 2017-09-17 DIAGNOSIS — R278 Other lack of coordination: Secondary | ICD-10-CM | POA: Diagnosis not present

## 2017-09-17 DIAGNOSIS — I110 Hypertensive heart disease with heart failure: Secondary | ICD-10-CM | POA: Insufficient documentation

## 2017-09-17 DIAGNOSIS — Z7951 Long term (current) use of inhaled steroids: Secondary | ICD-10-CM | POA: Diagnosis not present

## 2017-09-17 DIAGNOSIS — R488 Other symbolic dysfunctions: Secondary | ICD-10-CM | POA: Diagnosis not present

## 2017-09-17 DIAGNOSIS — I4891 Unspecified atrial fibrillation: Secondary | ICD-10-CM | POA: Diagnosis not present

## 2017-09-17 DIAGNOSIS — N3289 Other specified disorders of bladder: Secondary | ICD-10-CM | POA: Diagnosis not present

## 2017-09-17 DIAGNOSIS — N3091 Cystitis, unspecified with hematuria: Secondary | ICD-10-CM | POA: Diagnosis not present

## 2017-09-17 DIAGNOSIS — Z9841 Cataract extraction status, right eye: Secondary | ICD-10-CM | POA: Diagnosis not present

## 2017-09-17 DIAGNOSIS — K567 Ileus, unspecified: Secondary | ICD-10-CM | POA: Diagnosis not present

## 2017-09-17 DIAGNOSIS — Z961 Presence of intraocular lens: Secondary | ICD-10-CM | POA: Diagnosis not present

## 2017-09-17 DIAGNOSIS — G9341 Metabolic encephalopathy: Secondary | ICD-10-CM | POA: Diagnosis not present

## 2017-09-17 DIAGNOSIS — R338 Other retention of urine: Secondary | ICD-10-CM | POA: Diagnosis not present

## 2017-09-17 DIAGNOSIS — J9611 Chronic respiratory failure with hypoxia: Secondary | ICD-10-CM | POA: Diagnosis not present

## 2017-09-17 DIAGNOSIS — D72829 Elevated white blood cell count, unspecified: Secondary | ICD-10-CM | POA: Diagnosis not present

## 2017-09-17 DIAGNOSIS — R4182 Altered mental status, unspecified: Secondary | ICD-10-CM | POA: Diagnosis not present

## 2017-09-17 DIAGNOSIS — N390 Urinary tract infection, site not specified: Secondary | ICD-10-CM

## 2017-09-17 DIAGNOSIS — Z87891 Personal history of nicotine dependence: Secondary | ICD-10-CM

## 2017-09-17 DIAGNOSIS — R319 Hematuria, unspecified: Secondary | ICD-10-CM | POA: Diagnosis not present

## 2017-09-17 DIAGNOSIS — M2550 Pain in unspecified joint: Secondary | ICD-10-CM | POA: Diagnosis not present

## 2017-09-17 DIAGNOSIS — Z7189 Other specified counseling: Secondary | ICD-10-CM | POA: Diagnosis not present

## 2017-09-17 DIAGNOSIS — R310 Gross hematuria: Secondary | ICD-10-CM | POA: Diagnosis not present

## 2017-09-17 DIAGNOSIS — Z809 Family history of malignant neoplasm, unspecified: Secondary | ICD-10-CM | POA: Diagnosis not present

## 2017-09-17 DIAGNOSIS — R918 Other nonspecific abnormal finding of lung field: Secondary | ICD-10-CM | POA: Diagnosis not present

## 2017-09-17 DIAGNOSIS — M6281 Muscle weakness (generalized): Secondary | ICD-10-CM | POA: Diagnosis not present

## 2017-09-17 DIAGNOSIS — I10 Essential (primary) hypertension: Secondary | ICD-10-CM | POA: Diagnosis not present

## 2017-09-17 DIAGNOSIS — Z8249 Family history of ischemic heart disease and other diseases of the circulatory system: Secondary | ICD-10-CM | POA: Diagnosis not present

## 2017-09-17 DIAGNOSIS — Z9842 Cataract extraction status, left eye: Secondary | ICD-10-CM | POA: Diagnosis not present

## 2017-09-17 DIAGNOSIS — J449 Chronic obstructive pulmonary disease, unspecified: Secondary | ICD-10-CM

## 2017-09-17 DIAGNOSIS — E1151 Type 2 diabetes mellitus with diabetic peripheral angiopathy without gangrene: Secondary | ICD-10-CM | POA: Diagnosis not present

## 2017-09-17 DIAGNOSIS — Z66 Do not resuscitate: Secondary | ICD-10-CM | POA: Diagnosis not present

## 2017-09-17 DIAGNOSIS — Z7902 Long term (current) use of antithrombotics/antiplatelets: Secondary | ICD-10-CM | POA: Diagnosis not present

## 2017-09-17 DIAGNOSIS — K560 Paralytic ileus: Secondary | ICD-10-CM | POA: Diagnosis not present

## 2017-09-17 DIAGNOSIS — Z7401 Bed confinement status: Secondary | ICD-10-CM | POA: Diagnosis not present

## 2017-09-17 DIAGNOSIS — L89621 Pressure ulcer of left heel, stage 1: Secondary | ICD-10-CM | POA: Diagnosis not present

## 2017-09-17 DIAGNOSIS — Z515 Encounter for palliative care: Secondary | ICD-10-CM | POA: Diagnosis not present

## 2017-09-17 DIAGNOSIS — R1312 Dysphagia, oropharyngeal phase: Secondary | ICD-10-CM | POA: Diagnosis not present

## 2017-09-17 DIAGNOSIS — G301 Alzheimer's disease with late onset: Secondary | ICD-10-CM | POA: Diagnosis not present

## 2017-09-17 DIAGNOSIS — E785 Hyperlipidemia, unspecified: Secondary | ICD-10-CM | POA: Diagnosis not present

## 2017-09-17 DIAGNOSIS — F0150 Vascular dementia without behavioral disturbance: Secondary | ICD-10-CM | POA: Diagnosis not present

## 2017-09-17 LAB — URINALYSIS, ROUTINE W REFLEX MICROSCOPIC
Bilirubin Urine: NEGATIVE
Glucose, UA: 50 mg/dL — AB
Ketones, ur: NEGATIVE mg/dL
Nitrite: NEGATIVE
PH: 6 (ref 5.0–8.0)
Protein, ur: 100 mg/dL — AB
SPECIFIC GRAVITY, URINE: 1.014 (ref 1.005–1.030)

## 2017-09-17 MED ORDER — CEPHALEXIN 250 MG PO CAPS
1000.0000 mg | ORAL_CAPSULE | Freq: Once | ORAL | Status: AC
  Administered 2017-09-17: 1000 mg via ORAL
  Filled 2017-09-17: qty 4

## 2017-09-17 MED ORDER — CEPHALEXIN 500 MG PO CAPS: 1000.0000 mg | ORAL_CAPSULE | Freq: Two times a day (BID) | ORAL | 0 refills | Status: DC

## 2017-09-17 NOTE — ED Provider Notes (Signed)
[place] EMERGENCY DEPARTMENT Provider Note   CSN: [id number] Arrival date & time: 09/17/17  1455     History   Chief Complaint Chief Complaint  Patient presents with  . Urinary Frequency    HPI [name] is a 83 y.o. male.  HPI   83 year old male with increased urinary frequency.  He has advanced dementia and history is primarily from his wife at bedside.  She estimates that he got up approximately 10 times last night to urinate which is unusual for him.  She did check on him a few times during this and seemed like he was only voiding small amounts.  In his depends they noticed a couple spots of blood as well.  He also has been complaining of some lower abdominal/suprapubic pain.  No fevers.  She reports that he is at his baseline in terms of his mental status.  Past Medical History:  Diagnosis Date  . Alzheimer's dementia 2013  . Anemia    HX OF ANEMIA  . CHF (congestive heart failure) ([place])   . Colon cancer ([place])   . COPD (chronic obstructive pulmonary disease) ([place])   . Depression   . Dyspnea   . Hyperlipidemia   . Hypertension   . Pneumonia 2014  . PVD (peripheral vascular disease) ([place])   . Type II diabetes mellitus [facility])     Patient Active Problem List   Diagnosis Date Noted  . Sepsis ([place]) 05/02/2017  . Diabetes mellitus ([place]) 12/25/2013  . Type I (juvenile type) diabetes mellitus with peripheral circulatory disorders, not stated as uncontrolled(250.71) 12/18/2013  . Acute DVT (deep venous thrombosis) ([place]) 12/12/2013  . PNA (pneumonia) 12/11/2013  . Candida esophagitis ([place]) 09/06/2013  . Chronic systolic CHF (congestive heart failure) ([place]) 09/06/2013  . CAP (community acquired pneumonia) 09/05/2013  . COPD exacerbation ([place]) 09/04/2013  . Microcytic anemia 09/03/2013  . Chronic respiratory failure with hypoxia ([place]) 09/01/2013  . Symptomatic anemia 09/01/2013  . DOE (dyspnea on exertion) 09/01/2013  . Atherosclerosis of  native arteries of the extremities with ulceration ([place]) 06/24/2013  . PAD (peripheral artery disease) ([place]) 04/28/2013  . Chest pain 03/23/2013  . Nausea with vomiting 03/23/2013  . Pain in joint, ankle and foot 03/23/2013  . Dementia 07/04/2012  . URI (upper respiratory infection) 07/04/2012  . PVD (peripheral vascular disease) ([place]) 04/21/2012  . Leg pain 04/18/2012  . Upper airway cough syndrome 06/02/2007  . Hyperlipidemia 05/07/2007  . PVD 05/07/2007  . COPD COPD II  05/07/2007  . DYSPNEA 05/07/2007  . Essential hypertension 05/07/2007    Past Surgical History:  Procedure Laterality Date  . ABDOMINAL ANGIOGRAM N/A 04/20/2013   Procedure: ABDOMINAL ANGIOGRAM;  Surgeon: Angelia Mould, MD;  Location: [facility] CATH LAB;  Service: Cardiovascular;  Laterality: N/A;  . ABDOMINAL AORTOGRAM W/LOWER EXTREMITY N/A 11/20/2016   Procedure: ABDOMINAL AORTOGRAM W/LOWER EXTREMITY;  Surgeon: Serafina Mitchell, MD;  Location: [place] CV LAB;  Service: Cardiovascular;  Laterality: N/A;  . CATARACT EXTRACTION Right 02/2013  . CATARACT EXTRACTION W/ INTRAOCULAR LENS IMPLANT Left 01/2013  . COLON RESECTION  1997   /enc. notes 05/17/2004 (04/28/2013)  . COLON SURGERY  1996   cancer  . ESOPHAGOGASTRODUODENOSCOPY N/A 09/05/2013   Procedure: ESOPHAGOGASTRODUODENOSCOPY (EGD);  Surgeon: Lear Ng, MD;  Location: [facility] ENDOSCOPY;  Service: Endoscopy;  Laterality: N/A;  . FEMORAL ARTERY - FEMORAL ARTERY BYPASS GRAFT     right to left/enc. notes 05/17/2004 (04/28/2013)  . FEMORAL BYPASS Right    /  enc. notes 05/17/2004 (04/28/2013)  . FEMORAL-POPLITEAL BYPASS GRAFT Right 04/29/2013   Procedure:  FEMORAL-POPLITEAL ARTERY Bypass Graft with intraoperative ultrasound and arteriogarm;  Surgeon: Mal Misty, MD;  Location: [place];  Service: Vascular;  Laterality: Right;  . FIBEROPTIC BRONCHOSCOPY     Yvette Rack. notes 03/16/2005 (04/28/2013)  . INTRAOPERATIVE ARTERIOGRAM Right 04/29/2013   Procedure: INTRA OPERATIVE  ARTERIOGRAM;  Surgeon: Mal Misty, MD;  Location: [place];  Service: Vascular;  Laterality: Right;  . LOWER EXTREMITY ANGIOGRAM  04/20/2013   Procedure: LOWER EXTREMITY ANGIOGRAM;  Surgeon: Angelia Mould, MD;  Location:  [facility] CATH LAB;  Service: Cardiovascular;;  . LOWER EXTREMITY ANGIOGRAPHY Bilateral 06/25/2017   Procedure: LOWER EXTREMITY ANGIOGRAPHY;  Surgeon: Serafina Mitchell, MD;  Location: [place] CV LAB;  Service: Cardiovascular;  Laterality: Bilateral;  . [place] SURGERY  2005  . PERCUTANEOUS STENT INTERVENTION  04/20/2013   Procedure: PERCUTANEOUS STENT INTERVENTION;  Surgeon: Angelia Mould, MD;  Location: [facility] CATH LAB;  Service: Cardiovascular;;  right common iliac artery  . PERIPHERAL VASCULAR CATHETERIZATION N/A 01/31/2016   Procedure: Abdominal Aortogram w/Lower Extremity;  Surgeon: Serafina Mitchell, MD;  Location: [place] CV LAB;  Service: Cardiovascular;  Laterality: N/A;  . PERIPHERAL VASCULAR CATHETERIZATION Right 01/31/2016   Procedure: Peripheral Vascular Atherectomy;  Surgeon: Serafina Mitchell, MD;  Location: [place] CV LAB;  Service: Cardiovascular;  Laterality: Right;  Iliac common and external  . PERIPHERAL VASCULAR CATHETERIZATION Right 01/31/2016   Procedure: Peripheral Vascular Intervention;  Surgeon: Serafina Mitchell, MD;  Location: [place] CV LAB;  Service: Cardiovascular;  Laterality: Right;  external iliac and common iliac  . PERIPHERAL VASCULAR CATHETERIZATION N/A 04/24/2016   Procedure: Abdominal Aortogram w/Lower Extremity;  Surgeon: Serafina Mitchell, MD;  Location: [place] CV LAB;  Service: Cardiovascular;  Laterality: N/A;  . PERIPHERAL VASCULAR CATHETERIZATION Right 04/24/2016   Procedure: Peripheral Vascular Intervention;  Surgeon: Serafina Mitchell, MD;  Location: [place] CV LAB;  Service: Cardiovascular;  Laterality: Right;  external illiac Rt  . SPINE SURGERY    . VASCULAR SURGERY          Home Medications    Prior to  Admission medications   Medication Sig Start Date End Date Taking? Authorizing Provider  acetaminophen (TYLENOL) 500 MG tablet Take 500 mg by mouth daily.    [provider]  albuterol (PROVENTIL) (2.5 MG/3ML) 0.083% nebulizer solution Take 2.5 mg by nebulization every 6 (six) hours as needed for wheezing or shortness of breath.    [provider]  BREO ELLIPTA 100-25 MCG/INH AEPB TAKE 1 PUFF BY MOUTH EVERY DAY 09/09/17   Tanda Rockers, MD  cephALEXin (KEFLEX) 500 MG capsule Take 2 capsules (1,000 mg total) by mouth 2 (two) times daily. 09/17/17   Virgel Manifold, MD  clopidogrel (PLAVIX) 75 MG tablet TAKE 1 TABLET BY MOUTH DAILY. Patient taking differently: TAKE 75 MG BY MOUTH DAILY. 05/28/17   Waynetta Sandy, MD  digoxin (LANOXIN) 0.125 MG tablet Take 0.125 mg by mouth daily.     [provider]  famotidine (PEPCID) 20 MG tablet One at bedtime Patient taking differently: Take 20 mg by mouth daily. One at bedtime 04/26/17   Tanda Rockers, MD  fluticasone furoate-vilanterol (BREO ELLIPTA) 100-25 MCG/INH AEPB Inhale 1 puff into the lungs daily. 08/05/17   Tanda Rockers, MD  furosemide (LASIX) 20 MG tablet Take 1 tablet (20 mg total) by mouth daily. 10/27/13   Tanna Furry,  MD  metoprolol tartrate (LOPRESSOR) 25 MG tablet Take 0.5 tablets (12.5 mg total) by mouth 2 (two) times daily. 09/07/13   Samuella Cota, MD  naproxen sodium (ALEVE) 220 MG tablet Take 220 mg by mouth daily as needed (pain).    [provider]  pravastatin (PRAVACHOL) 80 MG tablet Take 80 mg by mouth daily. 07/17/17   [provider]  sitaGLIPtin (JANUVIA) 100 MG tablet Take 100 mg by mouth daily.    [provider]    Family History Family History  Problem Relation Age of Onset  . Stroke Mother 65  . Hypertension Mother   . Cancer Sister   . Heart disease Sister   . Heart disease Brother        Heart Disease before age 98  . Heart attack Brother   . Cancer  Sister   . Cancer Brother     Social History Social History   Tobacco Use  . Smoking status: Former Smoker    Packs/day: 0.30    Years: 40.00    Pack years: 12.00    Types: Cigarettes    Last attempt to quit: 03/26/1993    Years since quitting: 24.4  . Smokeless tobacco: Never Used  Substance Use Topics  . Alcohol use: No  . Drug use: No     Allergies   Patient has no known allergies.   Review of Systems Review of Systems  All systems reviewed and negative, other than as noted in HPI.  Physical Exam Updated Vital Signs BP (!) 170/75   Pulse 69   Temp 97.9 F (36.6 C) (Oral)   Resp 16   Ht 6\' 1"  (1.854 m)   Wt 69.9 kg (154 lb)   SpO2 100%   BMI 20.32 kg/m   Physical Exam  Constitutional: He appears well-developed and well-nourished. No distress.  HENT:  Head: Normocephalic and atraumatic.  Eyes: Conjunctivae are normal. Right eye exhibits no discharge. Left eye exhibits no discharge.  Neck: Neck supple.  Cardiovascular: Normal rate, regular rhythm and normal heart sounds. Exam reveals no gallop and no friction rub.  No murmur heard. Pulmonary/Chest: Effort normal and breath sounds normal. No respiratory distress.  Abdominal: Soft. He exhibits no distension. There is tenderness.  Mild suprapubic tenderness  Musculoskeletal: He exhibits no edema or tenderness.  Neurological: He is alert.  Skin: Skin is warm and dry.  Psychiatric: He has a normal mood and affect.  Pleasant but advanced dementia and many answers are appropriate to questions asked  Nursing note and vitals reviewed.    ED Treatments / Results  Labs (all labs ordered are listed, but only abnormal results are displayed) Labs Reviewed  URINALYSIS, ROUTINE W REFLEX MICROSCOPIC - Abnormal; Notable for the following components:      Result Value   APPearance CLOUDY (*)    Glucose, UA 50 (*)    Hgb urine dipstick LARGE (*)    Protein, ur 100 (*)    Leukocytes, UA LARGE (*)    Bacteria, UA  RARE (*)    All other components within normal limits  URINE CULTURE    EKG None  Radiology No results found.  Procedures Procedures (including critical care time)  Medications Ordered in ED Medications  cephALEXin (KEFLEX) capsule 1,000 mg (has no administration in time range)     Initial Impression / Assessment and Plan / ED Course  I have reviewed the triage vital signs and the nursing notes.  Pertinent labs & imaging  results that were available during my care of the patient were reviewed by me and considered in my medical decision making (see chart for details).     83 year old male with increased urinary frequency, dysuria and hematuria.  UA is consistent with UTI.  He is afebrile.  No vomiting.  Baseline mental status.  I feel is appropriate for outpatient treatment.  Final Clinical Impressions(s) / ED Diagnoses   Final diagnoses:  Lower urinary tract infectious disease    ED Discharge Orders        Ordered    cephALEXin (KEFLEX) 500 MG capsule  2 times daily     09/17/17 1713       Virgel Manifold, MD 09/21/17 2212

## 2017-09-17 NOTE — ED Notes (Signed)
Patient verbalizes understanding of discharge instructions. Opportunity for questioning and answers were provided. Armband removed by staff, pt discharged from ED.  

## 2017-09-17 NOTE — ED Provider Notes (Signed)
Patient placed in Quick Look pathway, seen and evaluated   Chief Complaint: Bleeding from penis  HPI:   Patient is demented, history provided by wife, there was a blood clot at the end of his penis.  He has been able to urinate today.  He goes to respite care during day, unsure how many times he has peed today.   ROS: No fevers (one)  Physical Exam:   Gen: No distress  Neuro: Awake and Alert  Skin: Warm    Focused Exam: No abd TTP.    Initiation of care has begun. The patient has been counseled on the process, plan, and necessity for staying for the completion/evaluation, and the remainder of the medical screening examination    Ollen Gross 09/17/17 [id number]    Tegeler, Gwenyth Allegra, MD 09/17/17 [phone]

## 2017-09-17 NOTE — ED Triage Notes (Signed)
Pt wife reports that the pt was up all night (maybe 15 times) going to the restroom. Wife took pt in to PCP today but pt was unable to void at that time. PCP recommended that he come here.

## 2017-09-18 LAB — URINE CULTURE: Culture: NO GROWTH

## 2017-09-19 ENCOUNTER — Inpatient Hospital Stay (HOSPITAL_COMMUNITY)
Admission: EM | Admit: 2017-09-19 | Discharge: 2017-10-02 | DRG: 689 | Disposition: A | Discharge status: Skilled Nursing Facility | Payer: PPO | Attending: Internal Medicine | Admitting: Internal Medicine

## 2017-09-19 ENCOUNTER — Encounter (HOSPITAL_COMMUNITY): Payer: Self-pay

## 2017-09-19 ENCOUNTER — Other Ambulatory Visit: Payer: Self-pay

## 2017-09-19 ENCOUNTER — Emergency Department (HOSPITAL_COMMUNITY): Payer: PPO

## 2017-09-19 DIAGNOSIS — E861 Hypovolemia: Secondary | ICD-10-CM | POA: Diagnosis present

## 2017-09-19 DIAGNOSIS — I10 Essential (primary) hypertension: Secondary | ICD-10-CM | POA: Diagnosis present

## 2017-09-19 DIAGNOSIS — Z7189 Other specified counseling: Secondary | ICD-10-CM | POA: Diagnosis not present

## 2017-09-19 DIAGNOSIS — R402362 Coma scale, best motor response, obeys commands, at arrival to emergency department: Secondary | ICD-10-CM | POA: Diagnosis present

## 2017-09-19 DIAGNOSIS — R339 Retention of urine, unspecified: Secondary | ICD-10-CM

## 2017-09-19 DIAGNOSIS — Z823 Family history of stroke: Secondary | ICD-10-CM

## 2017-09-19 DIAGNOSIS — Z85038 Personal history of other malignant neoplasm of large intestine: Secondary | ICD-10-CM | POA: Diagnosis not present

## 2017-09-19 DIAGNOSIS — G9341 Metabolic encephalopathy: Secondary | ICD-10-CM | POA: Diagnosis not present

## 2017-09-19 DIAGNOSIS — N390 Urinary tract infection, site not specified: Principal | ICD-10-CM | POA: Diagnosis present

## 2017-09-19 DIAGNOSIS — K567 Ileus, unspecified: Secondary | ICD-10-CM | POA: Diagnosis not present

## 2017-09-19 DIAGNOSIS — R319 Hematuria, unspecified: Secondary | ICD-10-CM

## 2017-09-19 DIAGNOSIS — R402142 Coma scale, eyes open, spontaneous, at arrival to emergency department: Secondary | ICD-10-CM | POA: Diagnosis present

## 2017-09-19 DIAGNOSIS — K560 Paralytic ileus: Secondary | ICD-10-CM | POA: Diagnosis not present

## 2017-09-19 DIAGNOSIS — R402252 Coma scale, best verbal response, oriented, at arrival to emergency department: Secondary | ICD-10-CM | POA: Diagnosis present

## 2017-09-19 DIAGNOSIS — N179 Acute kidney failure, unspecified: Secondary | ICD-10-CM | POA: Diagnosis present

## 2017-09-19 DIAGNOSIS — F0280 Dementia in other diseases classified elsewhere without behavioral disturbance: Secondary | ICD-10-CM | POA: Diagnosis present

## 2017-09-19 DIAGNOSIS — Z515 Encounter for palliative care: Secondary | ICD-10-CM

## 2017-09-19 DIAGNOSIS — G309 Alzheimer's disease, unspecified: Secondary | ICD-10-CM | POA: Diagnosis present

## 2017-09-19 DIAGNOSIS — Z961 Presence of intraocular lens: Secondary | ICD-10-CM | POA: Diagnosis present

## 2017-09-19 DIAGNOSIS — R310 Gross hematuria: Secondary | ICD-10-CM | POA: Diagnosis present

## 2017-09-19 DIAGNOSIS — R140 Abdominal distension (gaseous): Secondary | ICD-10-CM | POA: Diagnosis not present

## 2017-09-19 DIAGNOSIS — Z9841 Cataract extraction status, right eye: Secondary | ICD-10-CM

## 2017-09-19 DIAGNOSIS — E1151 Type 2 diabetes mellitus with diabetic peripheral angiopathy without gangrene: Secondary | ICD-10-CM | POA: Diagnosis present

## 2017-09-19 DIAGNOSIS — J449 Chronic obstructive pulmonary disease, unspecified: Secondary | ICD-10-CM | POA: Diagnosis present

## 2017-09-19 DIAGNOSIS — K56600 Partial intestinal obstruction, unspecified as to cause: Secondary | ICD-10-CM | POA: Diagnosis not present

## 2017-09-19 DIAGNOSIS — Z7951 Long term (current) use of inhaled steroids: Secondary | ICD-10-CM | POA: Diagnosis not present

## 2017-09-19 DIAGNOSIS — G301 Alzheimer's disease with late onset: Secondary | ICD-10-CM | POA: Diagnosis not present

## 2017-09-19 DIAGNOSIS — Z7902 Long term (current) use of antithrombotics/antiplatelets: Secondary | ICD-10-CM | POA: Diagnosis not present

## 2017-09-19 DIAGNOSIS — Z809 Family history of malignant neoplasm, unspecified: Secondary | ICD-10-CM

## 2017-09-19 DIAGNOSIS — Z8249 Family history of ischemic heart disease and other diseases of the circulatory system: Secondary | ICD-10-CM | POA: Diagnosis not present

## 2017-09-19 DIAGNOSIS — R0600 Dyspnea, unspecified: Secondary | ICD-10-CM

## 2017-09-19 DIAGNOSIS — R0989 Other specified symptoms and signs involving the circulatory and respiratory systems: Secondary | ICD-10-CM | POA: Diagnosis not present

## 2017-09-19 DIAGNOSIS — I110 Hypertensive heart disease with heart failure: Secondary | ICD-10-CM | POA: Diagnosis present

## 2017-09-19 DIAGNOSIS — I739 Peripheral vascular disease, unspecified: Secondary | ICD-10-CM | POA: Diagnosis not present

## 2017-09-19 DIAGNOSIS — L8990 Pressure ulcer of unspecified site, unspecified stage: Secondary | ICD-10-CM

## 2017-09-19 DIAGNOSIS — Z95828 Presence of other vascular implants and grafts: Secondary | ICD-10-CM

## 2017-09-19 DIAGNOSIS — N3289 Other specified disorders of bladder: Secondary | ICD-10-CM | POA: Diagnosis not present

## 2017-09-19 DIAGNOSIS — F0390 Unspecified dementia without behavioral disturbance: Secondary | ICD-10-CM | POA: Diagnosis present

## 2017-09-19 DIAGNOSIS — I5022 Chronic systolic (congestive) heart failure: Secondary | ICD-10-CM | POA: Diagnosis present

## 2017-09-19 DIAGNOSIS — E785 Hyperlipidemia, unspecified: Secondary | ICD-10-CM | POA: Diagnosis present

## 2017-09-19 DIAGNOSIS — Z66 Do not resuscitate: Secondary | ICD-10-CM | POA: Diagnosis not present

## 2017-09-19 DIAGNOSIS — K56609 Unspecified intestinal obstruction, unspecified as to partial versus complete obstruction: Secondary | ICD-10-CM

## 2017-09-19 DIAGNOSIS — L89621 Pressure ulcer of left heel, stage 1: Secondary | ICD-10-CM | POA: Diagnosis present

## 2017-09-19 DIAGNOSIS — Z87891 Personal history of nicotine dependence: Secondary | ICD-10-CM

## 2017-09-19 DIAGNOSIS — F0150 Vascular dementia without behavioral disturbance: Secondary | ICD-10-CM | POA: Diagnosis not present

## 2017-09-19 DIAGNOSIS — Z9842 Cataract extraction status, left eye: Secondary | ICD-10-CM | POA: Diagnosis not present

## 2017-09-19 DIAGNOSIS — Z978 Presence of other specified devices: Secondary | ICD-10-CM

## 2017-09-19 LAB — COMPREHENSIVE METABOLIC PANEL
ALBUMIN: 3.7 g/dL (ref 3.5–5.0)
ALT: 12 U/L (ref 0–44)
ANION GAP: 11 (ref 5–15)
AST: 26 U/L (ref 15–41)
Alkaline Phosphatase: 64 U/L (ref 38–126)
BUN: 29 mg/dL — ABNORMAL HIGH (ref 8–23)
CHLORIDE: 101 mmol/L (ref 98–111)
CO2: 22 mmol/L (ref 22–32)
Calcium: 9.3 mg/dL (ref 8.9–10.3)
Creatinine, Ser: 1.48 mg/dL — ABNORMAL HIGH (ref 0.61–1.24)
GFR calc non Af Amer: 43 mL/min — ABNORMAL LOW (ref >60)
GFR, EST AFRICAN AMERICAN: 50 mL/min — AB (ref >60)
GLUCOSE: 148 mg/dL — AB (ref 70–99)
Potassium: 4.7 mmol/L (ref 3.5–5.1)
SODIUM: 134 mmol/L — AB (ref 135–145)
Total Bilirubin: 0.8 mg/dL (ref 0.3–1.2)
Total Protein: 7.4 g/dL (ref 6.5–8.1)

## 2017-09-19 LAB — URINALYSIS, ROUTINE W REFLEX MICROSCOPIC
BILIRUBIN URINE: NEGATIVE
Glucose, UA: NEGATIVE mg/dL
KETONES UR: NEGATIVE mg/dL
Nitrite: NEGATIVE
PH: 6 (ref 5.0–8.0)
PROTEIN: 100 mg/dL — AB
Specific Gravity, Urine: 1.009 (ref 1.005–1.030)

## 2017-09-19 LAB — CBC WITH DIFFERENTIAL/PLATELET
ABS IMMATURE GRANULOCYTES: 0.1 10*3/uL (ref 0.0–0.1)
Basophils Absolute: 0.1 10*3/uL (ref 0.0–0.1)
Basophils Relative: 1 %
Eosinophils Absolute: 0.4 10*3/uL (ref 0.0–0.7)
Eosinophils Relative: 5 %
HEMATOCRIT: 43.6 % (ref 39.0–52.0)
Hemoglobin: 13.6 g/dL (ref 13.0–17.0)
IMMATURE GRANULOCYTES: 1 %
LYMPHS ABS: 1.7 10*3/uL (ref 0.7–4.0)
Lymphocytes Relative: 20 %
MCH: 28.3 pg (ref 26.0–34.0)
MCHC: 31.2 g/dL (ref 30.0–36.0)
MCV: 90.6 fL (ref 78.0–100.0)
MONO ABS: 0.9 10*3/uL (ref 0.1–1.0)
MONOS PCT: 10 %
NEUTROS ABS: 5.4 10*3/uL (ref 1.7–7.7)
Neutrophils Relative %: 63 %
Platelets: 384 10*3/uL (ref 150–400)
RBC: 4.81 MIL/uL (ref 4.22–5.81)
RDW: 15.2 % (ref 11.5–15.5)
WBC: 8.5 10*3/uL (ref 4.0–10.5)

## 2017-09-19 LAB — I-STAT CG4 LACTIC ACID, ED
LACTIC ACID, VENOUS: 2.6 mmol/L — AB (ref 0.5–1.9)
Lactic Acid, Venous: 2.14 mmol/L (ref 0.5–1.9)

## 2017-09-19 LAB — PROTIME-INR
INR: 1.01
Prothrombin Time: 13.2 seconds (ref 11.4–15.2)

## 2017-09-19 LAB — APTT: aPTT: 32 seconds (ref 24–36)

## 2017-09-19 LAB — GLUCOSE, CAPILLARY: Glucose-Capillary: 129 mg/dL — ABNORMAL HIGH (ref 70–99)

## 2017-09-19 MED ORDER — FLUTICASONE FUROATE-VILANTEROL 100-25 MCG/INH IN AEPB
1.0000 | INHALATION_SPRAY | Freq: Every day | RESPIRATORY_TRACT | Status: DC
  Administered 2017-09-20 – 2017-10-02 (×7): 1 via RESPIRATORY_TRACT
  Filled 2017-09-19: qty 28

## 2017-09-19 MED ORDER — FAMOTIDINE 20 MG PO TABS
20.0000 mg | ORAL_TABLET | Freq: Every day | ORAL | Status: DC
  Administered 2017-09-20 – 2017-09-23 (×4): 20 mg via ORAL
  Filled 2017-09-19 (×4): qty 1

## 2017-09-19 MED ORDER — ACETAMINOPHEN 325 MG PO TABS
650.0000 mg | ORAL_TABLET | Freq: Four times a day (QID) | ORAL | Status: DC | PRN
  Administered 2017-09-22: 650 mg via ORAL
  Filled 2017-09-19: qty 2

## 2017-09-19 MED ORDER — ACETAMINOPHEN 650 MG RE SUPP: 650.0000 mg | Freq: Four times a day (QID) | RECTAL | Status: DC | PRN

## 2017-09-19 MED ORDER — METOPROLOL TARTRATE 12.5 MG HALF TABLET
12.5000 mg | ORAL_TABLET | Freq: Two times a day (BID) | ORAL | Status: DC
  Administered 2017-09-19 – 2017-09-23 (×7): 12.5 mg via ORAL
  Filled 2017-09-19 (×8): qty 1

## 2017-09-19 MED ORDER — TRAMADOL HCL 50 MG PO TABS: 50.0000 mg | ORAL_TABLET | Freq: Four times a day (QID) | ORAL | Status: DC | PRN

## 2017-09-19 MED ORDER — ONDANSETRON HCL 4 MG PO TABS: 4.0000 mg | ORAL_TABLET | Freq: Four times a day (QID) | ORAL | Status: DC | PRN

## 2017-09-19 MED ORDER — SODIUM CHLORIDE 0.9 % IV SOLN
1.0000 g | INTRAVENOUS | Status: AC
  Administered 2017-09-20 – 2017-09-25 (×6): 1 g via INTRAVENOUS
  Filled 2017-09-19 (×6): qty 10

## 2017-09-19 MED ORDER — SODIUM CHLORIDE 0.9 % IV SOLN
Freq: Once | INTRAVENOUS | Status: AC
  Administered 2017-09-19: 13:00:00 via INTRAVENOUS

## 2017-09-19 MED ORDER — ONDANSETRON HCL 4 MG/2ML IJ SOLN
4.0000 mg | Freq: Four times a day (QID) | INTRAMUSCULAR | Status: DC | PRN
  Administered 2017-09-20 – 2017-09-21 (×2): 4 mg via INTRAVENOUS
  Filled 2017-09-19 (×2): qty 2

## 2017-09-19 MED ORDER — DIGOXIN 125 MCG PO TABS
0.1250 mg | ORAL_TABLET | Freq: Every day | ORAL | Status: DC
  Administered 2017-09-20 – 2017-09-23 (×3): 0.125 mg via ORAL
  Filled 2017-09-19 (×4): qty 1

## 2017-09-19 MED ORDER — SODIUM CHLORIDE 0.9 % IV SOLN
INTRAVENOUS | Status: AC
  Administered 2017-09-19 – 2017-09-20 (×2): via INTRAVENOUS

## 2017-09-19 MED ORDER — INSULIN ASPART 100 UNIT/ML ~~LOC~~ SOLN
0.0000 [IU] | Freq: Every day | SUBCUTANEOUS | Status: DC
  Administered 2017-09-22: 2 [IU] via SUBCUTANEOUS

## 2017-09-19 MED ORDER — SODIUM CHLORIDE 0.9 % IV SOLN: 1.0000 g | Freq: Once | INTRAVENOUS | Status: DC

## 2017-09-19 MED ORDER — COENZYME Q10 30 MG PO CAPS: 30.0000 mg | ORAL_CAPSULE | Freq: Every day | ORAL | Status: DC

## 2017-09-19 MED ORDER — INSULIN ASPART 100 UNIT/ML ~~LOC~~ SOLN
0.0000 [IU] | Freq: Three times a day (TID) | SUBCUTANEOUS | Status: DC
  Administered 2017-09-20 (×2): 1 [IU] via SUBCUTANEOUS
  Administered 2017-09-21: 2 [IU] via SUBCUTANEOUS
  Administered 2017-09-21: 1 [IU] via SUBCUTANEOUS
  Administered 2017-09-22 – 2017-09-24 (×6): 2 [IU] via SUBCUTANEOUS
  Administered 2017-09-25 (×2): 1 [IU] via SUBCUTANEOUS
  Administered 2017-09-25: 2 [IU] via SUBCUTANEOUS
  Administered 2017-09-26 (×3): 1 [IU] via SUBCUTANEOUS
  Administered 2017-09-27: 2 [IU] via SUBCUTANEOUS

## 2017-09-19 MED ORDER — POLYETHYLENE GLYCOL 3350 17 G PO PACK
17.0000 g | PACK | Freq: Every day | ORAL | Status: DC | PRN
  Filled 2017-09-19: qty 1

## 2017-09-19 MED ORDER — PRAVASTATIN SODIUM 40 MG PO TABS
80.0000 mg | ORAL_TABLET | Freq: Every day | ORAL | Status: DC
  Administered 2017-09-20 – 2017-09-23 (×4): 80 mg via ORAL
  Filled 2017-09-19 (×4): qty 2

## 2017-09-19 MED ORDER — SODIUM CHLORIDE 0.9 % IV SOLN
1.0000 g | Freq: Once | INTRAVENOUS | Status: AC
  Administered 2017-09-19: 1 g via INTRAVENOUS
  Filled 2017-09-19: qty 10

## 2017-09-19 NOTE — ED Notes (Signed)
Condom cath placed.

## 2017-09-19 NOTE — ED Notes (Signed)
CRITICAL VALUE ALERT  Critical Value: I-Stat Lactic Acid : 2.44   Date & Time Notied:  09/19/17 1045   Provider Notified: Colvin Caroli, MD + Barbee Shropshire., RN   Orders Received/Actions taken: Next to be roomed.

## 2017-09-19 NOTE — Consult Note (Signed)
Urology Consult  CC: Referring physician: Charlesetta Shanks, MD Reason for referral: Gross hematuria  Impression/Assessment: Gross hematuria: He has no lesions of his upper tract by CT scan however this was performed without contrast.  There is no evidence of hydronephrosis or stones and his bladder has wall thickening suggestive of possible cystitis.  There appeared to be a clot on the floor of the bladder just to the left of midline as well.  I discussed with the patient's wife and family the possible etiologies of gross hematuria including upper tract bleeding, acute, hemorrhagic bacterial cystitis and urothelial malignancy.  He has had prior radiation for colon cancer and this increases her risk for radiation cystitis that could potentially be worsened by an acute bacterial cystitis. A catheter will need to be placed and I would suggest continuous bladder irrigation with a repeat culture and empiric intravenous antibiotics.  I will then need to perform cystoscopic evaluation of the bladder once any infection has been cleared likely as an outpatient.  Bladder diverticulum: He was noted to have a bladder diverticulum on his CT scan at the dome of his bladder.  I did not see any lesions on the wall of the diverticulum.  This will be evaluated further with cystoscopy in the future.  Urinary incontinence: He has a long-standing history of urinary incontinence likely secondary to his dementia.   Plan:  1.  Place three-way Foley catheter and irrigate free of clots and then continue continuous bladder irrigation. 2.  Begin intravenous antibiotics. 3.  Culture urine. 4.  He will need follow-up as an outpatient for cystoscopy.  History of Present Illness: [name] is an 83 year old male patient seen in hospital consultation today for further evaluation of gross hematuria.  History was obtained from his family as he has Alzheimer's dementia.  His wife indicated that he had been in a respite care  facility that called and indicated that they felt he might have a UTI.  He was started on medication and then when his wife saw him it the aide who was cleaning him up indicated that a blood clot was noted in his depends diaper that he wears for chronic incontinence.  Apparently he had experienced some increased nocturia.  Due to the gross hematuria his antibiotic was switched to Cipro at 48 hours ago.  His wife indicates that he has been complaining of some lower abdominal discomfort.  Apparently urinary tract infections are not a chronic problem for him.  He also had been having increased frequency of urination.  He was seen in the emergency room on 09/18/2015 when findings suggested a UTI.  He was taking antibiotics at the time and a urine culture was performed that revealed no growth.  Past Medical History:  Diagnosis Date  . Alzheimer's dementia 2013  . Anemia    HX OF ANEMIA  . CHF (congestive heart failure) ([place])   . Colon cancer ([place])   . COPD (chronic obstructive pulmonary disease) ([place])   . Depression   . Dyspnea   . Hyperlipidemia   . Hypertension   . Pneumonia 2014  . PVD (peripheral vascular disease) ([place])   . Type II diabetes mellitus ([place])    Past Surgical History:  Procedure Laterality Date  . ABDOMINAL ANGIOGRAM N/A 04/20/2013   Procedure: ABDOMINAL ANGIOGRAM;  Surgeon: Angelia Mould, MD;  Location[facility] CATH LAB;  Service: Cardiovascular;  Laterality: N/A;  . ABDOMINAL AORTOGRAM W/LOWER EXTREMITY N/A 11/20/2016   Procedure: ABDOMINAL AORTOGRAM W/LOWER EXTREMITY;  Surgeon: Serafina Mitchell, MD;  Location: [place] CV LAB;  Service: Cardiovascular;  Laterality: N/A;  . CATARACT EXTRACTION Right 02/2013  . CATARACT EXTRACTION W/ INTRAOCULAR LENS IMPLANT Left 01/2013  . COLON RESECTION  1997   /enc. notes 05/17/2004 (04/28/2013)  . COLON SURGERY  1996   cancer  . ESOPHAGOGASTRODUODENOSCOPY N/A 09/05/2013   Procedure: ESOPHAGOGASTRODUODENOSCOPY (EGD);  Surgeon: Lear Ng, MD;  Location: [facility] ENDOSCOPY;  Service: Endoscopy;  Laterality: N/A;  . FEMORAL ARTERY - FEMORAL ARTERY BYPASS GRAFT     right to left/enc. notes 05/17/2004 (04/28/2013)  . FEMORAL BYPASS Right    /enc. notes 05/17/2004 (04/28/2013)  . FEMORAL-POPLITEAL BYPASS GRAFT Right 04/29/2013   Procedure:  FEMORAL-POPLITEAL ARTERY Bypass Graft with intraoperative ultrasound and arteriogarm;  Surgeon: Mal Misty, MD;  Location: [place];  Service: Vascular;  Laterality: Right;  . FIBEROPTIC BRONCHOSCOPY     Yvette Rack. notes 03/16/2005 (04/28/2013)  . INTRAOPERATIVE ARTERIOGRAM Right 04/29/2013   Procedure: INTRA OPERATIVE ARTERIOGRAM;  Surgeon: Mal Misty, MD;  Location: [place];  Service: Vascular;  Laterality: Right;  . LOWER EXTREMITY ANGIOGRAM  04/20/2013   Procedure: LOWER EXTREMITY ANGIOGRAM;  Surgeon: Angelia Mould, MD;  Location: [facility] CATH LAB;  Service: Cardiovascular;;  . LOWER EXTREMITY ANGIOGRAPHY Bilateral 06/25/2017   Procedure: LOWER EXTREMITY ANGIOGRAPHY;  Surgeon: Serafina Mitchell, MD;  Location: [place] CV LAB;  Service: Cardiovascular;  Laterality: Bilateral;  . [place] SURGERY  2005  . PERCUTANEOUS STENT INTERVENTION  04/20/2013   Procedure: PERCUTANEOUS STENT INTERVENTION;  Surgeon: Angelia Mould, MD;  Location: [facility] CATH LAB;  Service: Cardiovascular;;  right common iliac artery  . PERIPHERAL VASCULAR CATHETERIZATION N/A 01/31/2016   Procedure: Abdominal Aortogram w/Lower Extremity;  Surgeon: Serafina Mitchell, MD;  Location: [place] CV LAB;  Service: Cardiovascular;  Laterality: N/A;  . PERIPHERAL VASCULAR CATHETERIZATION Right 01/31/2016   Procedure: Peripheral Vascular Atherectomy;  Surgeon: Serafina Mitchell, MD;  Location: [place] CV LAB;  Service: Cardiovascular;  Laterality: Right;  Iliac common and external  . PERIPHERAL VASCULAR CATHETERIZATION Right 01/31/2016   Procedure: Peripheral Vascular Intervention;  Surgeon: Serafina Mitchell, MD;  Location: [place] CV LAB;  Service: Cardiovascular;  Laterality: Right;  external iliac and common iliac  . PERIPHERAL VASCULAR CATHETERIZATION N/A 04/24/2016   Procedure: Abdominal Aortogram w/Lower Extremity;  Surgeon: Serafina Mitchell, MD;  Location: [place] CV LAB;  Service: Cardiovascular;  Laterality: N/A;  . PERIPHERAL VASCULAR CATHETERIZATION Right 04/24/2016   Procedure: Peripheral Vascular Intervention;  Surgeon: Serafina Mitchell, MD;  Location: [place] CV LAB;  Service: Cardiovascular;  Laterality: Right;  external illiac Rt  . SPINE SURGERY    . VASCULAR SURGERY      Medications: Uncircumcised prior to Admission:  (Not in a hospital admission)  Allergies: No Known Allergies  Family History  Problem Relation Age of Onset  . Stroke Mother 13  . Hypertension Mother   . Cancer Sister   . Heart disease Sister   . Heart disease Brother        Heart Disease before age 4  . Heart attack Brother   . Cancer Sister   . Cancer Brother     Social History:  reports that he quit smoking about 24 years ago. His smoking use included cigarettes. He has a 12.00 pack-year smoking history. He has never used smokeless tobacco. He reports that he does not drink alcohol or use drugs.  Review of Systems (  10 point): Pertinent items are noted in HPI. A comprehensive review of systems was negative except as noted above.  Physical Exam:  Vital signs in last 24 hours: Temp:  [97.5 F (36.4 C)] 97.5 F (36.4 C) (06/27 1027) Pulse Rate:  [54-63] 56 (06/27 1559) Resp:  [16-23] 22 (06/27 1559) BP: (138-170)/(46-81) 156/59 (06/27 1330) SpO2:  [91 %-100 %] 100 % (06/27 1559) General appearance: alert and appears stated age Head: Normocephalic, without obvious abnormality, atraumatic Eyes: conjunctivae/corneas clear. EOM's intact.  Oropharynx: moist mucous membranes Neck: supple, symmetrical, trachea midline Resp: normal respiratory effort Cardio: regular rate and rhythm Back: symmetric, no  curvature. ROM normal. No CVA tenderness. GI: soft, non-tender; bowel sounds normal; no masses,  no organomegaly midline infra umbilical scar  Male genitalia: penis: normal uncircumcised male phallus with no lesions or discharge.Testes: bilaterally descended with no masses or tenderness.  He does have marked bilateral testicular atrophy.  No hernias  Extremities: extremities normal, atraumatic, no cyanosis or edema Skin: Skin color normal. No visible rashes or lesions Neurologic: Grossly normal  Laboratory Data:  Recent Labs    09/19/17 1038  WBC 8.5  HGB 13.6  HCT 43.6   BMET Recent Labs    09/19/17 1038  NA 134*  K 4.7  CL 101  CO2 22  GLUCOSE 148*  BUN 29*  CREATININE 1.48*  CALCIUM 9.3   No results for input(s): LABPT, INR in the last 72 hours. No results for input(s): LABURIN in the last 72 hours. Results for orders placed or performed during the hospital encounter of 09/17/17  Urine culture     Status: None   Collection Time: 09/17/17  4:31 PM  Result Value Ref Range Status   Specimen Description URINE, CLEAN CATCH  Final   Special Requests NONE  Final   Culture   Final    NO GROWTH Performed at [facility], [place] [address], [place], [place] [address]    Report Status 09/18/2017 FINAL  Final   Creatinine: Recent Labs    09/19/17 1038  CREATININE 1.48*    Imaging: Ct Renal Stone Study  Result Date: 09/19/2017 CLINICAL DATA:  Gross hematuria.  Urinary retention. EXAM: CT ABDOMEN AND PELVIS WITHOUT CONTRAST TECHNIQUE: Multidetector CT imaging of the abdomen and pelvis was performed following the standard protocol without IV contrast. COMPARISON:  None. FINDINGS: Lower chest: No acute abnormality. Hepatobiliary: No focal liver abnormality is seen. No gallstones, gallbladder wall thickening, or biliary dilatation. Pancreas: Unremarkable. No pancreatic ductal dilatation or surrounding inflammatory changes. Spleen: Normal in size without focal abnormality.  Adrenals/Urinary Tract: Adrenal glands appear normal. Kidneys appear normal. No hydronephrosis or renal obstruction is noted. Mild diffuse wall thickening of urinary bladder is noted with small diverticulum seen superiorly. Blood is noted in the dependent portion of the urinary bladder lumen. Stomach/Bowel: Stomach is within normal limits. Appendix appears normal. No evidence of bowel wall thickening, distention, or inflammatory changes. Vascular/Lymphatic: Aortic atherosclerosis. No enlarged abdominal or pelvic lymph nodes. Reproductive: Prostate is unremarkable. Other: No abdominal wall hernia or abnormality. No abdominopelvic ascites. Musculoskeletal: Postsurgical and degenerative changes are noted in the lower lumbar spine. No acute abnormality is noted. IMPRESSION: No renal or ureteral calculi are noted.  No hydronephrosis is noted. Mild diffuse urinary bladder wall thickening is noted concerning for cystitis. Small bladder diverticulum is noted superiorly. Probable hemorrhage is noted within the urinary bladder lumen; cystoscopy is recommended to rule out possible neoplasm or malignancy. Aortic Atherosclerosis (ICD10-I70.0). Electronically Signed  By: Marijo Conception, M.D.   On: 09/19/2017 15:08   CT scan images were independently reviewed.    Taylon Coole C 09/19/2017, 4:20 PM

## 2017-09-19 NOTE — ED Notes (Addendum)
ED TO INPATIENT HANDOFF REPORT  Name/Age/Gender [name] 83 y.o. male  Code Status Code Status History    Date Active Date Inactive Code Status Order ID Comments User Context   06/25/2017 1809 06/28/2017 1938 Full Code [id number]  Orbie Hurst Inpatient   06/25/2017 1130 06/25/2017 1809 Full Code [id number]  Serafina Mitchell, MD Inpatient   05/02/2017 1953 05/05/2017 1552 Full Code [id number]  Cristal Ford, DO ED   11/20/2016 1722 11/21/2016 0034 Full Code [id number]  Serafina Mitchell, MD Inpatient   04/24/2016 1736 04/25/2016 0010 Full Code [id number]  Serafina Mitchell, MD Inpatient   01/31/2016 1912 02/01/2016 1510 Full Code [id number]  Serafina Mitchell, MD Inpatient   01/31/2016 1912 01/31/2016 1912 Full Code [id number]  Serafina Mitchell, MD Inpatient   12/09/2013 1947 12/14/2013 2126 Full Code [id number]  Etta Quill, DO Inpatient   09/01/2013 2201 09/07/2013 1735 Full Code [id number]  Etta Quill, DO ED   04/29/2013 1209 05/02/2013 1842 Full Code [id number]  Gabriel Earing, PA-C Inpatient   04/28/2013 1127 04/29/2013 1209 Full Code [id number]  Ulyses Amor, PA-C Inpatient   03/23/2013 0554 03/24/2013 1710 Full Code [id number]  Etta Quill, DO ED   07/04/2012 1717 07/05/2012 1842 Full Code [id number]  Thurnell Lose, MD Inpatient      Home/SNF/Other Home  Chief Complaint Sent by dr prostate problems  Level of Care/Admitting Diagnosis ED Disposition    ED Disposition Condition [place]: [place] [100100]  Level of Care: Telemetry [5]  Diagnosis: Gross hematuria [599.71.ICD-9-CM]  Admitting Physician: Cristy Folks [[id number]]  Attending Physician: Cristy Folks [[id number]]  Estimated length of stay: past midnight tomorrow  Certification:: I certify this patient will need inpatient services for at least 2 midnights  PT Class (Do Not Modify): Inpatient [101]  PT Acc Code (Do Not Modify): Private [1]       Medical History Past  Medical History:  Diagnosis Date  . Alzheimer's dementia 2013  . Anemia    HX OF ANEMIA  . CHF (congestive heart failure) ([place])   . Colon cancer ([place])   . COPD (chronic obstructive pulmonary disease) ([place])   . Depression   . Dyspnea   . Hyperlipidemia   . Hypertension   . Pneumonia 2014  . PVD (peripheral vascular disease) ([place])   . Type II diabetes mellitus ([place])     Allergies No Known Allergies  IV Location/Drains/Wounds Patient Lines/Drains/Airways Status   Active Line/Drains/Airways    Name:   Placement date:   Placement time:   Site:   Days:   Peripheral IV 09/19/17 Right Antecubital   09/19/17    1211    Antecubital   less than 1   External Urinary Catheter   06/28/17    1045    -   83   External Urinary Catheter   09/19/17    1437    -   less than 1   Incision 04/29/13 Leg Right   04/29/13    1025     1604   Incision 04/29/13 Groin Right   04/29/13    1237     1604   Wound 05/01/13 Right   05/01/13    2100    -   1602          Labs/Imaging Results for orders placed or performed during the hospital encounter of 09/19/17 (from the past 48 hour(s))  Comprehensive metabolic panel     Status: Abnormal   Collection Time: 09/19/17 10:38 AM  Result Value Ref Range   Sodium 134 (L) 135 - 145 mmol/L   Potassium 4.7 3.5 - 5.1 mmol/L   Chloride 101 98 - 111 mmol/L    Comment: Please note change in reference range.   CO2 22 22 - 32 mmol/L   Glucose, Bld 148 (H) 70 - 99 mg/dL    Comment: Please note change in reference range.   BUN 29 (H) 8 - 23 mg/dL    Comment: Please note change in reference range.   Creatinine, Ser 1.48 (H) 0.61 - 1.24 mg/dL   Calcium 9.3 8.9 - 10.3 mg/dL   Total Protein 7.4 6.5 - 8.1 g/dL   Albumin 3.7 3.5 - 5.0 g/dL   AST 26 15 - 41 U/L   ALT 12 0 - 44 U/L    Comment: Please note change in reference range.   Alkaline Phosphatase 64 38 - 126 U/L   Total Bilirubin 0.8 0.3 - 1.2 mg/dL   GFR calc non Af Amer 43 (L) >60 mL/min   GFR calc Af Amer  50 (L) >60 mL/min    Comment: (NOTE) The eGFR has been calculated using the CKD EPI equation. This calculation has not been validated in all clinical situations. eGFR's persistently <60 mL/min signify possible Chronic Kidney Disease.    Anion gap 11 5 - 15    Comment: Performed at [facility] [address], [place], [place] [address]  CBC with Differential     Status: None   Collection Time: 09/19/17 10:38 AM  Result Value Ref Range   WBC 8.5 4.0 - 10.5 K/uL   RBC 4.81 4.22 - 5.81 MIL/uL   Hemoglobin 13.6 13.0 - 17.0 g/dL   HCT 43.6 39.0 - 52.0 %   MCV 90.6 78.0 - 100.0 fL   MCH 28.3 26.0 - 34.0 pg   MCHC 31.2 30.0 - 36.0 g/dL   RDW 15.2 11.5 - 15.5 %   Platelets 384 150 - 400 K/uL   Neutrophils Relative % 63 %   Neutro Abs 5.4 1.7 - 7.7 K/uL   Lymphocytes Relative 20 %   Lymphs Abs 1.7 0.7 - 4.0 K/uL   Monocytes Relative 10 %   Monocytes Absolute 0.9 0.1 - 1.0 K/uL   Eosinophils Relative 5 %   Eosinophils Absolute 0.4 0.0 - 0.7 K/uL   Basophils Relative 1 %   Basophils Absolute 0.1 0.0 - 0.1 K/uL   Immature Granulocytes 1 %   Abs Immature Granulocytes 0.1 0.0 - 0.1 K/uL    Comment: Performed at [facility] [address], [place], [place] [address]  I-Stat CG4 Lactic Acid, ED     Status: Abnormal   Collection Time: 09/19/17 10:43 AM  Result Value Ref Range   Lactic Acid, Venous 2.14 (HH) 0.5 - 1.9 mmol/L   Comment NOTIFIED PHYSICIAN   I-Stat CG4 Lactic Acid, ED     Status: Abnormal   Collection Time: 09/19/17 12:22 PM  Result Value Ref Range   Lactic Acid, Venous 2.60 (HH) 0.5 - 1.9 mmol/L   Comment NOTIFIED PHYSICIAN   Urinalysis, Routine w reflex microscopic     Status: Abnormal   Collection Time: 09/19/17  2:56 PM  Result Value Ref Range   Color, Urine RED (A) YELLOW   APPearance HAZY (A) CLEAR   Specific Gravity, Urine 1.009 1.005 - 1.030   pH 6.0  5.0 - 8.0   Glucose, UA NEGATIVE NEGATIVE mg/dL   Hgb urine dipstick LARGE (A) NEGATIVE    Bilirubin Urine NEGATIVE NEGATIVE   Ketones, ur NEGATIVE NEGATIVE mg/dL   Protein, ur 100 (A) NEGATIVE mg/dL   Nitrite NEGATIVE NEGATIVE   Leukocytes, UA MODERATE (A) NEGATIVE   RBC / HPF >50 (H) 0 - 5 RBC/hpf   WBC, UA >50 (H) 0 - 5 WBC/hpf   Bacteria, UA MANY (A) NONE SEEN   Squamous Epithelial / LPF 0-5 0 - 5   WBC Clumps PRESENT     Comment: Performed at [facility], [place] [address] [place], [place] [address]   Ct Renal Stone Study  Result Date: 09/19/2017 CLINICAL DATA:  Gross hematuria.  Urinary retention. EXAM: CT ABDOMEN AND PELVIS WITHOUT CONTRAST TECHNIQUE: Multidetector CT imaging of the abdomen and pelvis was performed following the standard protocol without IV contrast. COMPARISON:  None. FINDINGS: Lower chest: No acute abnormality. Hepatobiliary: No focal liver abnormality is seen. No gallstones, gallbladder wall thickening, or biliary dilatation. Pancreas: Unremarkable. No pancreatic ductal dilatation or surrounding inflammatory changes. Spleen: Normal in size without focal abnormality. Adrenals/Urinary Tract: Adrenal glands appear normal. Kidneys appear normal. No hydronephrosis or renal obstruction is noted. Mild diffuse wall thickening of urinary bladder is noted with small diverticulum seen superiorly. Blood is noted in the dependent portion of the urinary bladder lumen. Stomach/Bowel: Stomach is within normal limits. Appendix appears normal. No evidence of bowel wall thickening, distention, or inflammatory changes. Vascular/Lymphatic: Aortic atherosclerosis. No enlarged abdominal or pelvic lymph nodes. Reproductive: Prostate is unremarkable. Other: No abdominal wall hernia or abnormality. No abdominopelvic ascites. Musculoskeletal: Postsurgical and degenerative changes are noted in the lower lumbar spine. No acute abnormality is noted. IMPRESSION: No renal or ureteral calculi are noted.  No hydronephrosis is noted. Mild diffuse urinary bladder wall thickening is noted  concerning for cystitis. Small bladder diverticulum is noted superiorly. Probable hemorrhage is noted within the urinary bladder lumen; cystoscopy is recommended to rule out possible neoplasm or malignancy. Aortic Atherosclerosis (ICD10-I70.0). Electronically Signed   By: Marijo Conception, M.D.   On: 09/19/2017 15:08    Pending Labs Unresulted Labs (From admission, onward)   Start     Ordered   09/19/17 1257  Culture, blood (routine x 2)  BLOOD CULTURE X 2,   STAT     09/19/17 1257   09/19/17 1256  Urine culture  STAT,   STAT     09/19/17 1257   Signed and Held  Basic metabolic panel  Tomorrow morning,   R     Signed and Held   Signed and Held  CBC  Tomorrow morning,   R     Signed and Held   Signed and Held  Digoxin level  Once,   R     Signed and Held      Vitals/Pain Today's Vitals   09/19/17 1559 09/19/17 1615 09/19/17 1620 09/19/17 1645  BP:   (!) 147/57   Pulse: (!) 56 (!) 54 (!) 54 (!) 56  Resp: (!) 22 (!) '21 12 20  '$ Temp:      TempSrc:      SpO2: 100% 100% 100% 100%  PainSc:        Isolation Precautions No active isolations  Medications Medications  0.9 %  sodium chloride infusion ( Intravenous Transfusing/Transfer 09/19/17 1657)  cefTRIAXone (ROCEPHIN) 1 g in sodium chloride 0.9 % 100 mL IVPB (0 g Intravenous Stopped 09/19/17 1557)  Mobility walks with device

## 2017-09-19 NOTE — H&P (Signed)
History and Physical    [name] BPZ:[id number] DOB: 06/22/1934 DOA: 09/19/2017  PCP: Glendale Chard, MD   Patient coming from: Home    Chief Complaint: Hematuria  HPI: [name] is a 83 y.o. male with medical history significant of dementia, peripheral vascular disease status post right to left femorofemoral graft, to right iliac stents with most recent peripheral arteriogram on 07/14/2017 showing 50% stenosis in between right stents and 50% stenosis at the origin of the right to left femorofemoral bypass, chronic systolic heart failure with EF of 20 to 25% by echo on 09/05/2013 with grade 2 diastolic dysfunction, COPD, type 2 diabetes on oral hypoglycemics, hyperlipidemia, history of colon cancer status post resection remote who comes in with hematuria.  Patient's wife placed patient in respite care approximately 2 weeks ago.  He was initially doing well.  Proximal only 1 week ago wife noted some blood in patient's depends.  She initially attributed this to GI bleeding however patient's caretaker noted some blood at the 4s of the penis.  Patient also began to have episodes of gross hematuria.  Wife took patient to see PCP who initially try to obtain urine specimen but patient could not void.  Patient was sent to the ER where a urine specimen was obtained and patient was started on cephalexin.  Patient was then sent back to respite care and was noted to have gross hematuria that continued and they called his wife who called the PCP recommended evaluation in the emergency department.  The wife reports that the patient rarely complains but has been complaining of leg pain which due to his underlying dementia could be anything.  Not had any abdominal pain, fevers, nausea, vomiting, syncope/presyncope, cough, congestion, diarrhea.  ED Course: In the ED patient's vitals were notable for mild bradycardia and some hypertension.  Labs are notable for mildly elevated creatinine at 1.48.  Lactate was  elevated at 2.6.  UA showed gross hematuria along with moderate leukocytes, many bacteria and greater than 50 white blood cells.  CT renal stone protocol showed diffuse urinary bladder wall thickening concerning for cystitis.  Urology was consulted and Dr. Karsten Ro has seen the patient.  Recommendations are pending.  Review of Systems: As per HPI otherwise 10 point review of systems negative.    Past Medical History:  Diagnosis Date  . Alzheimer's dementia 2013  . Anemia    HX OF ANEMIA  . CHF (congestive heart failure) ([place])   . Colon cancer ([place])   . COPD (chronic obstructive pulmonary disease) ([place])   . Depression   . Dyspnea   . Hyperlipidemia   . Hypertension   . Pneumonia 2014  . PVD (peripheral vascular disease) ([place])   . Type II diabetes mellitus ([place])     Past Surgical History:  Procedure Laterality Date  . ABDOMINAL ANGIOGRAM N/A 04/20/2013   Procedure: ABDOMINAL ANGIOGRAM;  Surgeon: Angelia Mould, MD;  Location: [facility] CATH LAB;  Service: Cardiovascular;  Laterality: N/A;  . ABDOMINAL AORTOGRAM W/LOWER EXTREMITY N/A 11/20/2016   Procedure: ABDOMINAL AORTOGRAM W/LOWER EXTREMITY;  Surgeon: Serafina Mitchell, MD;  Location: [place] CV LAB;  Service: Cardiovascular;  Laterality: N/A;  . CATARACT EXTRACTION Right 02/2013  . CATARACT EXTRACTION W/ INTRAOCULAR LENS IMPLANT Left 01/2013  . COLON RESECTION  1997   /enc. notes 05/17/2004 (04/28/2013)  . COLON SURGERY  1996   cancer  . ESOPHAGOGASTRODUODENOSCOPY N/A 09/05/2013   Procedure: ESOPHAGOGASTRODUODENOSCOPY (EGD);  Surgeon: Lear Ng, MD;  Location:  [facility] ENDOSCOPY;  Service: Endoscopy;  Laterality: N/A;  . FEMORAL ARTERY - FEMORAL ARTERY BYPASS GRAFT     right to left/enc. notes 05/17/2004 (04/28/2013)  . FEMORAL BYPASS Right    /enc. notes 05/17/2004 (04/28/2013)  . FEMORAL-POPLITEAL BYPASS GRAFT Right 04/29/2013   Procedure:  FEMORAL-POPLITEAL ARTERY Bypass Graft with intraoperative ultrasound and arteriogarm;   Surgeon: Mal Misty, MD;  Location: [place];  Service: Vascular;  Laterality: Right;  . FIBEROPTIC BRONCHOSCOPY     Yvette Rack. notes 03/16/2005 (04/28/2013)  . INTRAOPERATIVE ARTERIOGRAM Right 04/29/2013   Procedure: INTRA OPERATIVE ARTERIOGRAM;  Surgeon: Mal Misty, MD;  Location: [place];  Service: Vascular;  Laterality: Right;  . LOWER EXTREMITY ANGIOGRAM  04/20/2013   Procedure: LOWER EXTREMITY ANGIOGRAM;  Surgeon: Angelia Mould, MD;  Location: [facility] CATH LAB;  Service: Cardiovascular;;  . LOWER EXTREMITY ANGIOGRAPHY Bilateral 06/25/2017   Procedure: LOWER EXTREMITY ANGIOGRAPHY;  Surgeon: Serafina Mitchell, MD;  Location: [place] CV LAB;  Service: Cardiovascular;  Laterality: Bilateral;  . [place] SURGERY  2005  . PERCUTANEOUS STENT INTERVENTION  04/20/2013   Procedure: PERCUTANEOUS STENT INTERVENTION;  Surgeon: Angelia Mould, MD;  Location: [facility] CATH LAB;  Service: Cardiovascular;;  right common iliac artery  . PERIPHERAL VASCULAR CATHETERIZATION N/A 01/31/2016   Procedure: Abdominal Aortogram w/Lower Extremity;  Surgeon: Serafina Mitchell, MD;  Location: [place] CV LAB;  Service: Cardiovascular;  Laterality: N/A;  . PERIPHERAL VASCULAR CATHETERIZATION Right 01/31/2016   Procedure: Peripheral Vascular Atherectomy;  Surgeon: Serafina Mitchell, MD;  Location: [place] CV LAB;  Service: Cardiovascular;  Laterality: Right;  Iliac common and external  . PERIPHERAL VASCULAR CATHETERIZATION Right 01/31/2016   Procedure: Peripheral Vascular Intervention;  Surgeon: Serafina Mitchell, MD;  Location: [place] CV LAB;  Service: Cardiovascular;  Laterality: Right;  external iliac and common iliac  . PERIPHERAL VASCULAR CATHETERIZATION N/A 04/24/2016   Procedure: Abdominal Aortogram w/Lower Extremity;  Surgeon: Serafina Mitchell, MD;  Location: [place] CV LAB;  Service: Cardiovascular;  Laterality: N/A;  . PERIPHERAL VASCULAR CATHETERIZATION Right 04/24/2016   Procedure: Peripheral Vascular  Intervention;  Surgeon: Serafina Mitchell, MD;  Location: [place] CV LAB;  Service: Cardiovascular;  Laterality: Right;  external illiac Rt  . SPINE SURGERY    . VASCULAR SURGERY       reports that he quit smoking about 24 years ago. His smoking use included cigarettes. He has a 12.00 pack-year smoking history. He has never used smokeless tobacco. He reports that he does not drink alcohol or use drugs.  No Known Allergies  Family History  Problem Relation Age of Onset  . Stroke Mother 51  . Hypertension Mother   . Cancer Sister   . Heart disease Sister   . Heart disease Brother        Heart Disease before age 59  . Heart attack Brother   . Cancer Sister   . Cancer Brother      Prior to Admission medications   Medication Sig Start Date End Date Taking? Authorizing Provider  acetaminophen (TYLENOL) 500 MG tablet Take 500 mg by mouth every 6 (six) hours as needed for mild pain.    Yes [provider]  albuterol (PROVENTIL) (2.5 MG/3ML) 0.083% nebulizer solution Take 2.5 mg by nebulization every 6 (six) hours as needed for wheezing or shortness of breath.   Yes [provider]  BREO ELLIPTA 100-25 MCG/INH AEPB TAKE 1 PUFF BY MOUTH EVERY DAY 09/09/17  Yes  Tanda Rockers, MD  cephALEXin (KEFLEX) 500 MG capsule Take 2 capsules (1,000 mg total) by mouth 2 (two) times daily. 09/17/17  Yes Virgel Manifold, MD  clopidogrel (PLAVIX) 75 MG tablet TAKE 1 TABLET BY MOUTH DAILY. Patient taking differently: TAKE 75 MG BY MOUTH DAILY. 05/28/17  Yes Waynetta Sandy, MD  co-enzyme Q-10 30 MG capsule Take 30 mg by mouth at bedtime.   Yes [provider]  digoxin (LANOXIN) 0.125 MG tablet Take 0.125 mg by mouth daily.    Yes [provider]  famotidine (PEPCID) 20 MG tablet One at bedtime Patient taking differently: Take 20 mg by mouth daily. One at bedtime 04/26/17  Yes Tanda Rockers, MD  furosemide (LASIX) 20 MG tablet Take 1 tablet (20 mg total) by mouth  daily. 10/27/13  Yes Tanna Furry, MD  IBUPROFEN PO Take by mouth.   Yes [provider]  metoprolol tartrate (LOPRESSOR) 25 MG tablet Take 0.5 tablets (12.5 mg total) by mouth 2 (two) times daily. 09/07/13  Yes Samuella Cota, MD  multivitamin-iron-minerals-folic acid (CENTRUM) chewable tablet Chew 1 tablet by mouth at bedtime.   Yes [provider]  naproxen sodium (ALEVE) 220 MG tablet Take 220 mg by mouth daily as needed (pain).   Yes [provider]  omega-3 acid ethyl esters (LOVAZA) 1 g capsule Take by mouth at bedtime.   Yes [provider]  pravastatin (PRAVACHOL) 80 MG tablet Take 80 mg by mouth daily. 07/17/17  Yes [provider]  sitaGLIPtin (JANUVIA) 100 MG tablet Take 100 mg by mouth daily.   Yes [provider]  fluticasone furoate-vilanterol (BREO ELLIPTA) 100-25 MCG/INH AEPB Inhale 1 puff into the lungs daily. Patient not taking: Reported on 09/19/2017 08/05/17   Tanda Rockers, MD    Physical Exam: Vitals:   09/19/17 1530 09/19/17 1545 09/19/17 1558 09/19/17 1559  BP:      Pulse: (!) 54 (!) 58 (!) 56 (!) 56  Resp: 20 (!) 21 (!) 23 (!) 22  Temp:      TempSrc:      SpO2: 100% 100% 100% 100%    Constitutional: NAD, calm, comfortable Vitals:   09/19/17 1530 09/19/17 1545 09/19/17 1558 09/19/17 1559  BP:      Pulse: (!) 54 (!) 58 (!) 56 (!) 56  Resp: 20 (!) 21 (!) 23 (!) 22  Temp:      TempSrc:      SpO2: 100% 100% 100% 100%   Eyes:  anicteric sclera ENMT: Dry mucous membranes, edentulous Neck: normal, supple Respiratory: clear to auscultation bilaterally, no wheezing, no crackles. Normal respiratory effort. No accessory muscle use.  Cardiovascular: Regular rate and rhythm, no murmurs / rubs / gallops  Abdomen: no tenderness, no masses palpated. No hepatosplenomegaly. Bowel sounds positive.  Musculoskeletal: Hairless lower extremities, diminished pulses bilaterally, no lower extremity edema Skin: no rashes on  visible skin Neurologic: Grossly intact, moving all extremities Psychiatric: Unable to assess due to underlying dementia   Labs on Admission: I have personally reviewed following labs and imaging studies  CBC: Recent Labs  Lab 09/19/17 1038  WBC 8.5  NEUTROABS 5.4  HGB 13.6  HCT 43.6  MCV 90.6  PLT 353   Basic Metabolic Panel: Recent Labs  Lab 09/19/17 1038  NA 134*  K 4.7  CL 101  CO2 22  GLUCOSE 148*  BUN 29*  CREATININE 1.48*  CALCIUM 9.3   GFR: Estimated Creatinine Clearance: 39.4 mL/min (A) (by C-G  formula based on SCr of 1.48 mg/dL (H)). Liver Function Tests: Recent Labs  Lab 09/19/17 1038  AST 26  ALT 12  ALKPHOS 64  BILITOT 0.8  PROT 7.4  ALBUMIN 3.7   No results for input(s): LIPASE, AMYLASE in the last 168 hours. No results for input(s): AMMONIA in the last 168 hours. Coagulation Profile: No results for input(s): INR, PROTIME in the last 168 hours. Cardiac Enzymes: No results for input(s): CKTOTAL, CKMB, CKMBINDEX, TROPONINI in the last 168 hours. BNP (last 3 results) No results for input(s): PROBNP in the last 8760 hours. HbA1C: No results for input(s): HGBA1C in the last 72 hours. CBG: No results for input(s): GLUCAP in the last 168 hours. Lipid Profile: No results for input(s): CHOL, HDL, LDLCALC, TRIG, CHOLHDL, LDLDIRECT in the last 72 hours. Thyroid Function Tests: No results for input(s): TSH, T4TOTAL, FREET4, T3FREE, THYROIDAB in the last 72 hours. Anemia Panel: No results for input(s): VITAMINB12, FOLATE, FERRITIN, TIBC, IRON, RETICCTPCT in the last 72 hours. Urine analysis:    Component Value Date/Time   COLORURINE RED (A) 09/19/2017 1456   APPEARANCEUR HAZY (A) 09/19/2017 1456   LABSPEC 1.009 09/19/2017 1456   PHURINE 6.0 09/19/2017 1456   GLUCOSEU NEGATIVE 09/19/2017 1456   HGBUR LARGE (A) 09/19/2017 1456   BILIRUBINUR NEGATIVE 09/19/2017 1456   BILIRUBINUR negative 05/23/2017 0855   KETONESUR NEGATIVE 09/19/2017 1456    PROTEINUR 100 (A) 09/19/2017 1456   UROBILINOGEN 0.2 05/23/2017 0855   UROBILINOGEN 1.0 12/10/2013 1456   NITRITE NEGATIVE 09/19/2017 1456   LEUKOCYTESUR MODERATE (A) 09/19/2017 1456    Radiological Exams on Admission: Ct Renal Stone Study  Result Date: 09/19/2017 CLINICAL DATA:  Gross hematuria.  Urinary retention. EXAM: CT ABDOMEN AND PELVIS WITHOUT CONTRAST TECHNIQUE: Multidetector CT imaging of the abdomen and pelvis was performed following the standard protocol without IV contrast. COMPARISON:  None. FINDINGS: Lower chest: No acute abnormality. Hepatobiliary: No focal liver abnormality is seen. No gallstones, gallbladder wall thickening, or biliary dilatation. Pancreas: Unremarkable. No pancreatic ductal dilatation or surrounding inflammatory changes. Spleen: Normal in size without focal abnormality. Adrenals/Urinary Tract: Adrenal glands appear normal. Kidneys appear normal. No hydronephrosis or renal obstruction is noted. Mild diffuse wall thickening of urinary bladder is noted with small diverticulum seen superiorly. Blood is noted in the dependent portion of the urinary bladder lumen. Stomach/Bowel: Stomach is within normal limits. Appendix appears normal. No evidence of bowel wall thickening, distention, or inflammatory changes. Vascular/Lymphatic: Aortic atherosclerosis. No enlarged abdominal or pelvic lymph nodes. Reproductive: Prostate is unremarkable. Other: No abdominal wall hernia or abnormality. No abdominopelvic ascites. Musculoskeletal: Postsurgical and degenerative changes are noted in the lower lumbar spine. No acute abnormality is noted. IMPRESSION: No renal or ureteral calculi are noted.  No hydronephrosis is noted. Mild diffuse urinary bladder wall thickening is noted concerning for cystitis. Small bladder diverticulum is noted superiorly. Probable hemorrhage is noted within the urinary bladder lumen; cystoscopy is recommended to rule out possible neoplasm or malignancy. Aortic  Atherosclerosis (ICD10-I70.0). Electronically Signed   By: Marijo Conception, M.D.   On: 09/19/2017 15:08    EKG: Independently reviewed.  None performed as not indicated  Assessment/Plan Principal Problem:   Hematuria Active Problems:   Hyperlipidemia   COPD COPD II    Essential hypertension   PVD (peripheral vascular disease) ([facility])   Dementia   Chronic systolic CHF (congestive heart failure) ([facility])   #) Gross hematuria: Etiology is unclear but differential includes possible neoplasm, glomerular disease, UTI causing  cystitis and hematuria due to use of clopidogrel. -Urology consult -Bladder irrigation -Urology will consider advanced imaging -Continue IV ceftriaxone 1 g daily  #) AKI: Patient is mild elevation in creatinine.  It is unclear if this is related to the gross hematuria or possible underlying infection.  Suspect most likely prerenal.  He does not appear to be volume overloaded. -Hold diuretics - Gentle IV hydration for 1 day  #) Peripheral vascular disease: Patient has had multiple interventions including one right to left femorofemoral bypass and 2 stents on the right.  He has occlusion of multiple arteries as well.  Per the review of the chart unfortunately further interventions are limited by the patient's severe underlying dementia and other medical comorbidities. - Hold clopidogrel  #) chronic systolic heart failure: EF of 20 to 25% by last echo approximately 4 years ago.  Currently appears to be euvolemic to slightly hypovolemic. -Hold furosemide 20 mg daily -Continue metoprolol tartrate 25 mg twice daily -Hold clopidogrel in the setting of significant hematuria -Continue pravastatin 80 mg daily -Continue digoxin 0.125 mg daily, check dig level  #) Type 2 diabetes: -Hold sitagliptin 100 mg daily -Sliding scale insulin, AC at bedtime Exline-carb restricted diet  #) COPD: -Continue PRN short acting bronchodilator -Continue ICS/LABA  Fluids: Gentle IV  fluids Electrolytes: Monitor and supplement Nutrition: Carb/heart healthy diet  Prophylaxis: SCDs  Disposition: Pending resolution of hematuria  Full code    Cristy Folks MD [facility]   If 7PM-7AM, please contact night-coverage [email or web address] Password [email or web address]  09/19/2017, 4:20 PM

## 2017-09-19 NOTE — ED Triage Notes (Signed)
Pt was here on Tuesday due to urinary retention. He was started on abx and per family member reports he has been having increased bleeding.

## 2017-09-19 NOTE — ED Notes (Signed)
Bladder scan completed, 589 ml resulted.

## 2017-09-19 NOTE — ED Notes (Signed)
Patient transported to CT 

## 2017-09-19 NOTE — ED Notes (Signed)
Positive lactic informed to MD

## 2017-09-19 NOTE — ED Provider Notes (Signed)
[name] DEPARTMENT Provider Note   CSN: [id number] Arrival date & time: 09/19/17  1011     History   Chief Complaint Chief Complaint  Patient presents with  . Hematuria    HPI [name] is a 83 y.o. male.  HPI Patient is wife reports that the patient had been in respite care while she was out of town.  She was when she returned back he was having very frequent urination and was seen by his PCP.  He was diagnosed with urinary tract infection.  He continued to have frequency and then seemed to have blood in the urine.  He was evaluated in the emergency department on the 25th and findings were suggestive of UTI.  He was given Keflex.  He has been taking Keflex.  His wife reports however today he had large amount of blood clot and was complaining of pain in his suprapubic area.  He picked him up from his adult daycare respite and brought him to the emergency department for evaluation. No  Fevers that they have measured. No  Vomiting.  Past Medical History:  Diagnosis Date  . Alzheimer's dementia 2013  . Anemia    HX OF ANEMIA  . CHF (congestive heart failure) ([place])   . Colon cancer ([place])   . COPD (chronic obstructive pulmonary disease) ([place])   . Depression   . Dyspnea   . Hyperlipidemia   . Hypertension   . Pneumonia 2014  . PVD (peripheral vascular disease) ([place])   . Type II diabetes mellitus [facility])     Patient Active Problem List   Diagnosis Date Noted  . Sepsis ([place]) 05/02/2017  . Diabetes mellitus ([place]) 12/25/2013  . Type I (juvenile type) diabetes mellitus with peripheral circulatory disorders, not stated as uncontrolled(250.71) 12/18/2013  . Acute DVT (deep venous thrombosis) ([place]) 12/12/2013  . PNA (pneumonia) 12/11/2013  . Candida esophagitis ([place]) 09/06/2013  . Chronic systolic CHF (congestive heart failure) ([place]) 09/06/2013  . CAP (community acquired pneumonia) 09/05/2013  . COPD exacerbation ([place]) 09/04/2013  . Microcytic  anemia 09/03/2013  . Chronic respiratory failure with hypoxia ([place]) 09/01/2013  . Symptomatic anemia 09/01/2013  . DOE (dyspnea on exertion) 09/01/2013  . Atherosclerosis of native arteries of the extremities with ulceration ([place]) 06/24/2013  . PAD (peripheral artery disease) ([place]) 04/28/2013  . Chest pain 03/23/2013  . Nausea with vomiting 03/23/2013  . Pain in joint, ankle and foot 03/23/2013  . Dementia 07/04/2012  . URI (upper respiratory infection) 07/04/2012  . PVD (peripheral vascular disease) ([place]) 04/21/2012  . Leg pain 04/18/2012  . Upper airway cough syndrome 06/02/2007  . Hyperlipidemia 05/07/2007  . PVD 05/07/2007  . COPD COPD II  05/07/2007  . DYSPNEA 05/07/2007  . Essential hypertension 05/07/2007    Past Surgical History:  Procedure Laterality Date  . ABDOMINAL ANGIOGRAM N/A 04/20/2013   Procedure: ABDOMINAL ANGIOGRAM;  Surgeon: Angelia Mould, MD;  Location: [facility] CATH LAB;  Service: Cardiovascular;  Laterality: N/A;  . ABDOMINAL AORTOGRAM W/LOWER EXTREMITY N/A 11/20/2016   Procedure: ABDOMINAL AORTOGRAM W/LOWER EXTREMITY;  Surgeon: Serafina Mitchell, MD;  Location: [facility] CV LAB;  Service: Cardiovascular;  Laterality: N/A;  . CATARACT EXTRACTION Right 02/2013  . CATARACT EXTRACTION W/ INTRAOCULAR LENS IMPLANT Left 01/2013  . COLON RESECTION  1997   /enc. notes 05/17/2004 (04/28/2013)  . COLON SURGERY  1996   cancer  . ESOPHAGOGASTRODUODENOSCOPY N/A 09/05/2013   Procedure: ESOPHAGOGASTRODUODENOSCOPY (EGD);  Surgeon: Lear Ng, MD;  Location: [facility] ENDOSCOPY;  Service: Endoscopy;  Laterality: N/A;  . FEMORAL ARTERY - FEMORAL ARTERY BYPASS GRAFT     right to left/enc. notes 05/17/2004 (04/28/2013)  . FEMORAL BYPASS Right    /enc. notes 05/17/2004 (04/28/2013)  . FEMORAL-POPLITEAL BYPASS GRAFT Right 04/29/2013   Procedure:  FEMORAL-POPLITEAL ARTERY Bypass Graft with intraoperative ultrasound and arteriogarm;  Surgeon: Mal Misty, MD;  Location: [facility];   Service: Vascular;  Laterality: Right;  . FIBEROPTIC BRONCHOSCOPY     Yvette Rack. notes 03/16/2005 (04/28/2013)  . INTRAOPERATIVE ARTERIOGRAM Right 04/29/2013   Procedure: INTRA OPERATIVE ARTERIOGRAM;  Surgeon: Mal Misty, MD;  Location: [facility];  Service: Vascular;  Laterality: Right;  . LOWER EXTREMITY ANGIOGRAM  04/20/2013   Procedure: LOWER EXTREMITY ANGIOGRAM;  Surgeon: Angelia Mould, MD;  Location[facility] CATH LAB;  Service: Cardiovascular;;  . LOWER EXTREMITY ANGIOGRAPHY Bilateral 06/25/2017   Procedure: LOWER EXTREMITY ANGIOGRAPHY;  Surgeon: Serafina Mitchell, MD;  Location: [facility] CV LAB;  Service: Cardiovascular;  Laterality: Bilateral;  . [place] SURGERY  2005  . PERCUTANEOUS STENT INTERVENTION  04/20/2013   Procedure: PERCUTANEOUS STENT INTERVENTION;  Surgeon: Angelia Mould, MD;  Location: [facility] CATH LAB;  Service: Cardiovascular;;  right common iliac artery  . PERIPHERAL VASCULAR CATHETERIZATION N/A 01/31/2016   Procedure: Abdominal Aortogram w/Lower Extremity;  Surgeon: Serafina Mitchell, MD;  Location: [facility] CV LAB;  Service: Cardiovascular;  Laterality: N/A;  . PERIPHERAL VASCULAR CATHETERIZATION Right 01/31/2016   Procedure: Peripheral Vascular Atherectomy;  Surgeon: Serafina Mitchell, MD;  Location: [facility] CV LAB;  Service: Cardiovascular;  Laterality: Right;  Iliac common and external  . PERIPHERAL VASCULAR CATHETERIZATION Right 01/31/2016   Procedure: Peripheral Vascular Intervention;  Surgeon: Serafina Mitchell, MD;  Location: [facility] CV LAB;  Service: Cardiovascular;  Laterality: Right;  external iliac and common iliac  . PERIPHERAL VASCULAR CATHETERIZATION N/A 04/24/2016   Procedure: Abdominal Aortogram w/Lower Extremity;  Surgeon: Serafina Mitchell, MD;  Location: [facility] CV LAB;  Service: Cardiovascular;  Laterality: N/A;  . PERIPHERAL VASCULAR CATHETERIZATION Right 04/24/2016   Procedure: Peripheral Vascular Intervention;  Surgeon: Serafina Mitchell, MD;   Location: [facility] CV LAB;  Service: Cardiovascular;  Laterality: Right;  external illiac Rt  . SPINE SURGERY    . VASCULAR SURGERY          Home Medications    Prior to Admission medications   Medication Sig Start Date End Date Taking? Authorizing Provider  acetaminophen (TYLENOL) 500 MG tablet Take 500 mg by mouth daily.    [provider]  albuterol (PROVENTIL) (2.5 MG/3ML) 0.083% nebulizer solution Take 2.5 mg by nebulization every 6 (six) hours as needed for wheezing or shortness of breath.    [provider]  BREO ELLIPTA 100-25 MCG/INH AEPB TAKE 1 PUFF BY MOUTH EVERY DAY 09/09/17   Tanda Rockers, MD  cephALEXin (KEFLEX) 500 MG capsule Take 2 capsules (1,000 mg total) by mouth 2 (two) times daily. 09/17/17   Virgel Manifold, MD  clopidogrel (PLAVIX) 75 MG tablet TAKE 1 TABLET BY MOUTH DAILY. Patient taking differently: TAKE 75 MG BY MOUTH DAILY. 05/28/17   Waynetta Sandy, MD  digoxin (LANOXIN) 0.125 MG tablet Take 0.125 mg by mouth daily.     [provider]  famotidine (PEPCID) 20 MG tablet One at bedtime Patient taking differently: Take 20 mg by mouth daily. One at bedtime 04/26/17   Tanda Rockers, MD  fluticasone furoate-vilanterol (BREO ELLIPTA) 100-25  MCG/INH AEPB Inhale 1 puff into the lungs daily. 08/05/17   Tanda Rockers, MD  furosemide (LASIX) 20 MG tablet Take 1 tablet (20 mg total) by mouth daily. 10/27/13   Tanna Furry, MD  metoprolol tartrate (LOPRESSOR) 25 MG tablet Take 0.5 tablets (12.5 mg total) by mouth 2 (two) times daily. 09/07/13   Samuella Cota, MD  naproxen sodium (ALEVE) 220 MG tablet Take 220 mg by mouth daily as needed (pain).    [provider]  pravastatin (PRAVACHOL) 80 MG tablet Take 80 mg by mouth daily. 07/17/17   [provider]  sitaGLIPtin (JANUVIA) 100 MG tablet Take 100 mg by mouth daily.    [provider]    Family History Family History  Problem Relation Age of Onset  .  Stroke Mother 76  . Hypertension Mother   . Cancer Sister   . Heart disease Sister   . Heart disease Brother        Heart Disease before age 68  . Heart attack Brother   . Cancer Sister   . Cancer Brother     Social History Social History   Tobacco Use  . Smoking status: Former Smoker    Packs/day: 0.30    Years: 40.00    Pack years: 12.00    Types: Cigarettes    Last attempt to quit: 03/26/1993    Years since quitting: 24.5  . Smokeless tobacco: Never Used  Substance Use Topics  . Alcohol use: No  . Drug use: No     Allergies   Patient has no known allergies.   Review of Systems Review of Systems  10 Systems reviewed and are negative for acute change except as noted in the HPI.  Physical Exam Updated Vital Signs BP (!) 156/59   Pulse (!) 55   Temp (!) 97.5 F (36.4 C) (Oral)   Resp (!) 21   SpO2 100%   Physical Exam  Constitutional: He appears well-developed and well-nourished.  HENT:  Head: Normocephalic and atraumatic.  Eyes: EOM are normal.  Neck: Neck supple.  Cardiovascular: Normal rate, regular rhythm, normal heart sounds and intact distal pulses.  Pulmonary/Chest: Effort normal and breath sounds normal.  Abdominal: Soft. Bowel sounds are normal. He exhibits distension. There is tenderness.  Musculoskeletal: Normal range of motion. He exhibits no edema.  Neurological: He is alert. He has normal strength. Coordination normal. GCS eye subscore is 4. GCS verbal subscore is 5. GCS motor subscore is 6.  Skin: Skin is warm, dry and intact.  Psychiatric: He has a normal mood and affect.     ED Treatments / Results  Labs (all labs ordered are listed, but only abnormal results are displayed) Labs Reviewed  COMPREHENSIVE METABOLIC PANEL - Abnormal; Notable for the following components:      Result Value   Sodium 134 (*)    Glucose, Bld 148 (*)    BUN 29 (*)    Creatinine, Ser 1.48 (*)    GFR calc non Af Amer 43 (*)    GFR calc Af Amer 50 (*)     All other components within normal limits  URINALYSIS, ROUTINE W REFLEX MICROSCOPIC - Abnormal; Notable for the following components:   APPearance HAZY (*)    Hgb urine dipstick LARGE (*)    Protein, ur 100 (*)    Leukocytes, UA MODERATE (*)    RBC / HPF >50 (*)    WBC, UA >50 (*)    Bacteria, UA MANY (*)  All other components within normal limits  I-STAT CG4 LACTIC ACID, ED - Abnormal; Notable for the following components:   Lactic Acid, Venous 2.14 (*)    All other components within normal limits  I-STAT CG4 LACTIC ACID, ED - Abnormal; Notable for the following components:   Lactic Acid, Venous 2.60 (*)    All other components within normal limits  URINE CULTURE  CULTURE, BLOOD (ROUTINE X 2)  CULTURE, BLOOD (ROUTINE X 2)  CBC WITH DIFFERENTIAL/PLATELET    EKG None  Radiology Ct Renal Stone Study  Result Date: 09/19/2017 CLINICAL DATA:  Gross hematuria.  Urinary retention. EXAM: CT ABDOMEN AND PELVIS WITHOUT CONTRAST TECHNIQUE: Multidetector CT imaging of the abdomen and pelvis was performed following the standard protocol without IV contrast. COMPARISON:  None. FINDINGS: Lower chest: No acute abnormality. Hepatobiliary: No focal liver abnormality is seen. No gallstones, gallbladder wall thickening, or biliary dilatation. Pancreas: Unremarkable. No pancreatic ductal dilatation or surrounding inflammatory changes. Spleen: Normal in size without focal abnormality. Adrenals/Urinary Tract: Adrenal glands appear normal. Kidneys appear normal. No hydronephrosis or renal obstruction is noted. Mild diffuse wall thickening of urinary bladder is noted with small diverticulum seen superiorly. Blood is noted in the dependent portion of the urinary bladder lumen. Stomach/Bowel: Stomach is within normal limits. Appendix appears normal. No evidence of bowel wall thickening, distention, or inflammatory changes. Vascular/Lymphatic: Aortic atherosclerosis. No enlarged abdominal or pelvic lymph nodes.  Reproductive: Prostate is unremarkable. Other: No abdominal wall hernia or abnormality. No abdominopelvic ascites. Musculoskeletal: Postsurgical and degenerative changes are noted in the lower lumbar spine. No acute abnormality is noted. IMPRESSION: No renal or ureteral calculi are noted.  No hydronephrosis is noted. Mild diffuse urinary bladder wall thickening is noted concerning for cystitis. Small bladder diverticulum is noted superiorly. Probable hemorrhage is noted within the urinary bladder lumen; cystoscopy is recommended to rule out possible neoplasm or malignancy. Aortic Atherosclerosis (ICD10-I70.0). Electronically Signed   By: Marijo Conception, M.D.   On: 09/19/2017 15:08    Procedures Procedures (including critical care time)  Medications Ordered in ED Medications  cefTRIAXone (ROCEPHIN) 1 g in sodium chloride 0.9 % 100 mL IVPB (1 g Intravenous New Bag/Given 09/19/17 1527)  0.9 %  sodium chloride infusion ( Intravenous New Bag/Given 09/19/17 1308)     Initial Impression / Assessment and Plan / ED Course  I have reviewed the triage vital signs and the nursing notes.  Pertinent labs & imaging results that were available during my care of the patient were reviewed by me and considered in my medical decision making (see chart for details).  Clinical Course as of Sep 19 1553  Thu Sep 19, 2017  1552 Consult: Dr. Karsten Ro.  Reports he is in the hospital and will evaluate the patient.   [MP]    Clinical Course User Index [MP] Charlesetta Shanks, MD   All diagnostic studies reviewed.  Final Clinical Impressions(s) / ED Diagnoses   Final diagnoses:  AKI (acute kidney injury) ([place])  Gross hematuria  Urinary retention   patient does have advanced dementia.  He however is interactive, goes to adult daycare and is cared for by his wife.  Changes noted as per above.  Patient had been treated for UTI.  At this time however it appears he has urinary retention and clot in the bladder.  CT shows  bladder changes that may be layering blood but neoplasm cannot be excluded.  And has acute kidney injury.  Plan for admission.  Consultation has been  placed to urology and patient will be assessed for hematuria with clot formation and urinary retention.  ED Discharge Orders    None       Charlesetta Shanks, MD 09/23/17 [phone]

## 2017-09-20 DIAGNOSIS — I5022 Chronic systolic (congestive) heart failure: Secondary | ICD-10-CM

## 2017-09-20 DIAGNOSIS — N179 Acute kidney failure, unspecified: Secondary | ICD-10-CM

## 2017-09-20 LAB — CBC
HCT: 39.3 % (ref 39.0–52.0)
Hemoglobin: 12.7 g/dL — ABNORMAL LOW (ref 13.0–17.0)
MCH: 28.3 pg (ref 26.0–34.0)
MCHC: 32.3 g/dL (ref 30.0–36.0)
MCV: 87.5 fL (ref 78.0–100.0)
Platelets: 407 10*3/uL — ABNORMAL HIGH (ref 150–400)
RBC: 4.49 MIL/uL (ref 4.22–5.81)
RDW: 15.1 % (ref 11.5–15.5)
WBC: 12.7 10*3/uL — ABNORMAL HIGH (ref 4.0–10.5)

## 2017-09-20 LAB — BASIC METABOLIC PANEL WITH GFR
Anion gap: 9 (ref 5–15)
CO2: 20 mmol/L — ABNORMAL LOW (ref 22–32)
Calcium: 8.6 mg/dL — ABNORMAL LOW (ref 8.9–10.3)
Chloride: 108 mmol/L (ref 98–111)
Glucose, Bld: 162 mg/dL — ABNORMAL HIGH (ref 70–99)

## 2017-09-20 LAB — BASIC METABOLIC PANEL
BUN: 23 mg/dL (ref 8–23)
Creatinine, Ser: 1.64 mg/dL — ABNORMAL HIGH (ref 0.61–1.24)
GFR calc Af Amer: 44 mL/min — ABNORMAL LOW (ref >60)
GFR calc non Af Amer: 38 mL/min — ABNORMAL LOW (ref >60)
Potassium: 4.2 mmol/L (ref 3.5–5.1)
Sodium: 137 mmol/L (ref 135–145)

## 2017-09-20 LAB — URINE CULTURE: Culture: NO GROWTH

## 2017-09-20 LAB — GLUCOSE, CAPILLARY
Glucose-Capillary: 140 mg/dL — ABNORMAL HIGH (ref 70–99)
Glucose-Capillary: 145 mg/dL — ABNORMAL HIGH (ref 70–99)
Glucose-Capillary: 125 mg/dL — ABNORMAL HIGH (ref 70–99)
Glucose-Capillary: 169 mg/dL — ABNORMAL HIGH (ref 70–99)

## 2017-09-20 LAB — DIGOXIN LEVEL: Digoxin Level: 0.5 ng/mL — ABNORMAL LOW (ref 0.8–2.0)

## 2017-09-20 MED ORDER — SODIUM CHLORIDE 0.9 % IV BOLUS
500.0000 mL | Freq: Once | INTRAVENOUS | Status: AC
  Administered 2017-09-20: 500 mL via INTRAVENOUS

## 2017-09-20 MED ORDER — PROMETHAZINE HCL 25 MG/ML IJ SOLN: 12.5000 mg | Freq: Once | INTRAMUSCULAR | Status: DC

## 2017-09-20 MED ORDER — HYDRALAZINE HCL 20 MG/ML IJ SOLN
10.0000 mg | Freq: Four times a day (QID) | INTRAMUSCULAR | Status: DC | PRN
  Administered 2017-09-22: 10 mg via INTRAVENOUS
  Filled 2017-09-20: qty 1

## 2017-09-20 MED ORDER — LORAZEPAM 2 MG/ML IJ SOLN
1.0000 mg | Freq: Once | INTRAMUSCULAR | Status: AC
  Administered 2017-09-20: 1 mg via INTRAVENOUS
  Filled 2017-09-20: qty 1

## 2017-09-20 NOTE — Progress Notes (Signed)
[facility] PROGRESS NOTE  LOIS OSTROM KDT:[id number] DOB: 13-Sep-1934 DOA: 09/19/2017  PCP: Glendale Chard, MD  Brief History/Interval Summary: 83 year old African-American male with a past medical history of dementia, peripheral vascular disease status post right to left femoral-femoral graft, right iliac stents, chronic systolic CHF with EF of 20 to 25%, COPD, type 2 diabetes, history of colon cancer status post resection with hematuria.  Patient was seen by urology and was hospitalized.  He was started on continuous bladder irrigation.  Reason for Visit: Hematuria  Consultants: Urology  Procedures: Insertion of Foley catheter  Antibiotics: Ceftriaxone  Subjective/Interval History: Patient confused, somewhat agitated.  He does have a history of dementia.  Difficult to obtain information from him.  His wife and daughter at the bedside.  ROS: Unable to do due to his dementia  Objective:  Vital Signs  Vitals:   09/19/17 2203 09/20/17 0300 09/20/17 0405 09/20/17 1024  BP:   140/79   Pulse: 68  93 64  Resp:   17   Temp:  98.1 F (36.7 C) 97.8 F (36.6 C)   TempSrc:   Oral   SpO2:   99%   Weight:      Height:        Intake/Output Summary (Last 24 hours) at 09/20/2017 1408 Last data filed at 09/20/2017 1130 Gross per 24 hour  Intake 1199.93 ml  Output 1850 ml  Net -650.07 ml   Filed Weights   09/19/17 1732  Weight: 72.3 kg (159 lb 6.3 oz)    General appearance: alert, cooperative, appears stated age, distracted and no distress Head: Normocephalic, without obvious abnormality, atraumatic Resp: clear to auscultation bilaterally Cardio: regular rate and rhythm, S1, S2 normal, no murmur, click, rub or gallop GI: soft, non-tender; bowel sounds normal; no masses,  no organomegaly Extremities: extremities normal, atraumatic, no cyanosis or edema Neurologic: No focal neurological deficits however patient is quite disoriented.  Lab Results:  Data Reviewed: I  have personally reviewed following labs and imaging studies  CBC: Recent Labs  Lab 09/19/17 1038 09/20/17 0555  WBC 8.5 12.7*  NEUTROABS 5.4  --   HGB 13.6 12.7*  HCT 43.6 39.3  MCV 90.6 87.5  PLT 384 407*    Basic Metabolic Panel: Recent Labs  Lab 09/19/17 1038 09/20/17 0555  NA 134* 137  K 4.7 4.2  CL 101 108  CO2 22 20*  GLUCOSE 148* 162*  BUN 29* 23  CREATININE 1.48* 1.64*  CALCIUM 9.3 8.6*    GFR: Estimated Creatinine Clearance: 36.7 mL/min (A) (by C-G formula based on SCr of 1.64 mg/dL (H)).  Liver Function Tests: Recent Labs  Lab 09/19/17 1038  AST 26  ALT 12  ALKPHOS 64  BILITOT 0.8  PROT 7.4  ALBUMIN 3.7    Coagulation Profile: Recent Labs  Lab 09/19/17 2005  INR 1.01    CBG: Recent Labs  Lab 09/19/17 2145 09/20/17 0732 09/20/17 1204  GLUCAP 129* 140* 145*     Recent Results (from the past 240 hour(s))  Urine culture     Status: None   Collection Time: 09/17/17  4:31 PM  Result Value Ref Range Status   Specimen Description URINE, CLEAN CATCH  Final   Special Requests NONE  Final   Culture   Final    NO GROWTH Performed at [facility], [place] [address], [place], [place] [address]    Report Status 09/18/2017 FINAL  Final  Culture, blood (routine x 2)  Status: None (Preliminary result)   Collection Time: 09/19/17 12:13 PM  Result Value Ref Range Status   Specimen Description BLOOD RIGHT ANTECUBITAL  Final   Special Requests   Final    BOTTLES DRAWN AEROBIC AND ANAEROBIC Blood Culture adequate volume   Culture   Final    NO GROWTH < 24 HOURS Performed at [facility], [address], [place], [place] [address]    Report Status PENDING  Incomplete  Culture, blood (routine x 2)     Status: None (Preliminary result)   Collection Time: 09/19/17  1:30 PM  Result Value Ref Range Status   Specimen Description BLOOD LEFT ANTECUBITAL  Final   Special Requests   Final    BOTTLES DRAWN AEROBIC AND ANAEROBIC Blood Culture  adequate volume   Culture   Final    NO GROWTH < 24 HOURS Performed at [facility], [place] [address], [place], [place] [address]    Report Status PENDING  Incomplete      Radiology Studies: Ct Renal Stone Study  Result Date: 09/19/2017 CLINICAL DATA:  Gross hematuria.  Urinary retention. EXAM: CT ABDOMEN AND PELVIS WITHOUT CONTRAST TECHNIQUE: Multidetector CT imaging of the abdomen and pelvis was performed following the standard protocol without IV contrast. COMPARISON:  None. FINDINGS: Lower chest: No acute abnormality. Hepatobiliary: No focal liver abnormality is seen. No gallstones, gallbladder wall thickening, or biliary dilatation. Pancreas: Unremarkable. No pancreatic ductal dilatation or surrounding inflammatory changes. Spleen: Normal in size without focal abnormality. Adrenals/Urinary Tract: Adrenal glands appear normal. Kidneys appear normal. No hydronephrosis or renal obstruction is noted. Mild diffuse wall thickening of urinary bladder is noted with small diverticulum seen superiorly. Blood is noted in the dependent portion of the urinary bladder lumen. Stomach/Bowel: Stomach is within normal limits. Appendix appears normal. No evidence of bowel wall thickening, distention, or inflammatory changes. Vascular/Lymphatic: Aortic atherosclerosis. No enlarged abdominal or pelvic lymph nodes. Reproductive: Prostate is unremarkable. Other: No abdominal wall hernia or abnormality. No abdominopelvic ascites. Musculoskeletal: Postsurgical and degenerative changes are noted in the lower lumbar spine. No acute abnormality is noted. IMPRESSION: No renal or ureteral calculi are noted.  No hydronephrosis is noted. Mild diffuse urinary bladder wall thickening is noted concerning for cystitis. Small bladder diverticulum is noted superiorly. Probable hemorrhage is noted within the urinary bladder lumen; cystoscopy is recommended to rule out possible neoplasm or malignancy. Aortic Atherosclerosis  (ICD10-I70.0). Electronically Signed   By: Marijo Conception, M.D.   On: 09/19/2017 15:08     Medications:  Scheduled: . digoxin  0.125 mg Oral Daily  . famotidine  20 mg Oral Daily  . fluticasone furoate-vilanterol  1 puff Inhalation Daily  . insulin aspart  0-5 Units Subcutaneous QHS  . insulin aspart  0-9 Units Subcutaneous TID WC  . metoprolol tartrate  12.5 mg Oral BID  . pravastatin  80 mg Oral Daily   Continuous: . sodium chloride 100 mL/hr at 09/20/17 0841  . cefTRIAXone (ROCEPHIN)  IV 1 g (09/20/17 1328)   [facility]:[id number] **OR** acetaminophen, ondansetron **OR** ondansetron (ZOFRAN) IV, polyethylene glycol, traMADol  Assessment/Plan:    Gross hematuria Etiology unclear.  Differential diagnosis includes UTI, neoplasm, hematuria in the setting of Plavix.  Urology was consulted.  Foley catheter was placed.  Patient underwent continuous bladder irrigation.  Urine appears to have cleared.  Urology continues to follow and manage.  He will eventually need cystoscopy as an outpatient.  Due to concern for urinary tract infection patient was  placed on ceftriaxone.  Continue for now.  Hemoglobin remains stable.  Follow-up on urine culture.  Acute kidney injury Patient's baseline creatinine around 1.1.  Patient's creatinine was 1.48 at admission.  Slightly increased today.  Probably related to his hematuria and UTI.  Increase IV fluids.  Monitor urine output.  Recheck labs tomorrow.  No hydronephrosis noted on CT scan.  History of peripheral vascular disease Patient has had multiple interventions including bypass as well as stent placements.  Apparently further interventions are limited by patient's dementia.  Holding Plavix due to hematuria.  History of chronic systolic CHF EF known to be about 20 to 25% per echo about 4 years ago.  Was somewhat hypovolemic.  Getting IV fluids.  Holding his diuretics.  Noted to be on digoxin.  Dig level was nontoxic.  Type 2 diabetes Holding his  home medications.  SSI.  Monitor CBGs.  History of COPD Stable.  History of dementia Appears to be at baseline with perhaps increased agitation during nighttime.  This is likely due to a new environment acute infection and acute illness etc.  Discussed in detail with family.  DVT Prophylaxis: SCDs    Code Status: Full code Family Communication: Discussed with patient's wife and daughter Disposition Plan: Management as outlined above.    LOS: 1 day   [facility] Pager [phone] 09/20/2017, 2:08 PM  If 7PM-7AM, please contact night-coverage at [email or web address], password [facility]

## 2017-09-20 NOTE — Progress Notes (Signed)
Assessment: Gross hematuria: No upper tract lesion by noncontrasted CT and urine now clear on CBI at a very slow drip rate.  Likely hemorrhagic cystitis.  Cultures are pending.   Plan:  1.  Continue Foley catheterization. 2.  Stop CBI. 3.  If urine remains clear catheter can be removed tomorrow. 4.  Follow-up cystoscopy will be performed to rule out a bladder lesion.   Subjective: Patient seems restless.  He appears to be in no apparent distress.  His daughter indicates that he did pull on his Foley catheter last night.  He currently has soft mitts on.  Objective: Vital signs in last 24 hours: Temp:  [97.4 F (36.3 C)-98.2 F (36.8 C)] 97.8 F (36.6 C) (06/28 0405) Pulse Rate:  [53-93] 93 (06/28 0405) Resp:  [12-23] 17 (06/28 0405) BP: (138-170)/(46-81) 140/79 (06/28 0405) SpO2:  [86 %-100 %] 99 % (06/28 0405) Weight:  [72.3 kg (159 lb 6.3 oz)] 72.3 kg (159 lb 6.3 oz) (06/27 1732)A  Intake/Output from previous day: 06/27 0701 - 06/28 0700 In: 99.9 [IV Piggyback:99.9] Out: 1400 [Urine:1400] Intake/Output this shift: Total I/O In: -  Out: 1050 [Urine:1050]  Past Medical History:  Diagnosis Date  . Alzheimer's dementia 2013  . Anemia    HX OF ANEMIA  . CHF (congestive heart failure) ([place])   . Colon cancer ([place])   . COPD (chronic obstructive pulmonary disease) ([place])   . Depression   . Dyspnea   . Hyperlipidemia   . Hypertension   . Pneumonia 2014  . PVD (peripheral vascular disease) ([place])   . Type II diabetes mellitus ([facility])     Physical Exam:  General: Awake, alert and in no apparent distress Lungs: Normal respiratory effort, chest expands symmetrically.  Abdomen: Soft, non-tender & non-distended.  Lab Results: Recent Labs    09/19/17 1038  WBC 8.5  HGB 13.6  HCT 43.6   BMET Recent Labs    09/19/17 1038  NA 134*  K 4.7  CL 101  CO2 22  GLUCOSE 148*  BUN 29*  CREATININE 1.48*  CALCIUM 9.3   No results for input(s): LABURIN in the last 72  hours. Results for orders placed or performed during the hospital encounter of 09/17/17  Urine culture     Status: None   Collection Time: 09/17/17  4:31 PM  Result Value Ref Range Status   Specimen Description URINE, CLEAN CATCH  Final   Special Requests NONE  Final   Culture   Final    NO GROWTH Performed at [facility], [place] [address], [place], [place] [address]    Report Status 09/18/2017 FINAL  Final    Studies/Results: Ct Renal Stone Study  Result Date: 09/19/2017 CLINICAL DATA:  Gross hematuria.  Urinary retention. EXAM: CT ABDOMEN AND PELVIS WITHOUT CONTRAST TECHNIQUE: Multidetector CT imaging of the abdomen and pelvis was performed following the standard protocol without IV contrast. COMPARISON:  None. FINDINGS: Lower chest: No acute abnormality. Hepatobiliary: No focal liver abnormality is seen. No gallstones, gallbladder wall thickening, or biliary dilatation. Pancreas: Unremarkable. No pancreatic ductal dilatation or surrounding inflammatory changes. Spleen: Normal in size without focal abnormality. Adrenals/Urinary Tract: Adrenal glands appear normal. Kidneys appear normal. No hydronephrosis or renal obstruction is noted. Mild diffuse wall thickening of urinary bladder is noted with small diverticulum seen superiorly. Blood is noted in the dependent portion of the urinary bladder lumen. Stomach/Bowel: Stomach is within normal limits. Appendix appears normal. No evidence of bowel wall thickening, distention,  or inflammatory changes. Vascular/Lymphatic: Aortic atherosclerosis. No enlarged abdominal or pelvic lymph nodes. Reproductive: Prostate is unremarkable. Other: No abdominal wall hernia or abnormality. No abdominopelvic ascites. Musculoskeletal: Postsurgical and degenerative changes are noted in the lower lumbar spine. No acute abnormality is noted. IMPRESSION: No renal or ureteral calculi are noted.  No hydronephrosis is noted. Mild diffuse urinary bladder wall thickening is  noted concerning for cystitis. Small bladder diverticulum is noted superiorly. Probable hemorrhage is noted within the urinary bladder lumen; cystoscopy is recommended to rule out possible neoplasm or malignancy. Aortic Atherosclerosis (ICD10-I70.0). Electronically Signed   By: Marijo Conception, M.D.   On: 09/19/2017 15:08      Claybon Jabs 09/20/2017, 6:08 AM    Patient ID: [name], male   DOB: 1934-11-17, 83 y.o.   MRN: [id number]

## 2017-09-20 NOTE — Progress Notes (Signed)
Spoke with Penny Pia, MD regarding placement of foley catheter. Per MD, nursing to place 3 way foley catheter (18Fr, with 64ml to be placed in ballon) and initiate CBI. Will continue with plan and continue to monitor.

## 2017-09-20 NOTE — Progress Notes (Signed)
No output since emptied on 7p-7a shift, irrigated with 184ml saline. Returned 145ml of blood tinged fluid. Bladder scanned for 84ml. Notified Dr. Maryland Pink, Dr. Edwena Blow. Dr. Edwena Blow clarified as long as fluid was returning from being irrigated then the scan could not be correct. Direct patient care and monitoring will continue for patient. Pt is agitated and confused, wearing mitts with NS running at 166ml/hour.

## 2017-09-21 ENCOUNTER — Inpatient Hospital Stay (HOSPITAL_COMMUNITY): Payer: PPO

## 2017-09-21 LAB — CBC
HEMATOCRIT: 37 % — AB (ref 39.0–52.0)
Hemoglobin: 11.8 g/dL — ABNORMAL LOW (ref 13.0–17.0)
MCH: 28.6 pg (ref 26.0–34.0)
MCHC: 31.9 g/dL (ref 30.0–36.0)
MCV: 89.6 fL (ref 78.0–100.0)
Platelets: 371 10*3/uL (ref 150–400)
RBC: 4.13 MIL/uL — ABNORMAL LOW (ref 4.22–5.81)
RDW: 15.6 % — AB (ref 11.5–15.5)
WBC: 20.3 10*3/uL — AB (ref 4.0–10.5)

## 2017-09-21 LAB — BASIC METABOLIC PANEL
ANION GAP: 6 (ref 5–15)
BUN: 26 mg/dL — ABNORMAL HIGH (ref 8–23)
CALCIUM: 8.2 mg/dL — AB (ref 8.9–10.3)
CO2: 19 mmol/L — AB (ref 22–32)
Chloride: 115 mmol/L — ABNORMAL HIGH (ref 98–111)
Creatinine, Ser: 2.09 mg/dL — ABNORMAL HIGH (ref 0.61–1.24)
GFR calc Af Amer: 33 mL/min — ABNORMAL LOW (ref >60)
GFR calc non Af Amer: 28 mL/min — ABNORMAL LOW (ref >60)
GLUCOSE: 145 mg/dL — AB (ref 70–99)
Potassium: 4.9 mmol/L (ref 3.5–5.1)
Sodium: 140 mmol/L (ref 135–145)

## 2017-09-21 LAB — GLUCOSE, CAPILLARY
Glucose-Capillary: 140 mg/dL — ABNORMAL HIGH (ref 70–99)
Glucose-Capillary: 151 mg/dL — ABNORMAL HIGH (ref 70–99)
Glucose-Capillary: 133 mg/dL — ABNORMAL HIGH (ref 70–99)
Glucose-Capillary: 117 mg/dL — ABNORMAL HIGH (ref 70–99)

## 2017-09-21 MED ORDER — SODIUM CHLORIDE 0.45 % IV SOLN
INTRAVENOUS | Status: AC
  Administered 2017-09-21 – 2017-09-24 (×4): via INTRAVENOUS

## 2017-09-21 MED ORDER — SODIUM CHLORIDE 0.9 % IV BOLUS
500.0000 mL | Freq: Once | INTRAVENOUS | Status: AC
  Administered 2017-09-21: 500 mL via INTRAVENOUS

## 2017-09-21 NOTE — Progress Notes (Signed)
Urine irrigant clear/minimally pink, no clots  I will have cathter removed for voiding trial  Check bladder scans  I'd wait a day to check on voiding before d/c

## 2017-09-21 NOTE — Evaluation (Signed)
Clinical/Bedside Swallow Evaluation Patient Details  Name: [name] MRN: [id number] Date of Birth: Sep 14, 1934  Today's Date: 09/21/2017 Time: SLP Start Time (ACUTE ONLY): 1350 SLP Stop Time (ACUTE ONLY): 1408 SLP Time Calculation (min) (ACUTE ONLY): 18 min  Past Medical History:  Past Medical History:  Diagnosis Date  . Alzheimer's dementia 2013  . Anemia    HX OF ANEMIA  . CHF (congestive heart failure) ([place])   . Colon cancer ([place])   . COPD (chronic obstructive pulmonary disease) ([place])   . Depression   . Dyspnea   . Hyperlipidemia   . Hypertension   . Pneumonia 2014  . PVD (peripheral vascular disease) ([place])   . Type II diabetes mellitus ([place])    Past Surgical History:  Past Surgical History:  Procedure Laterality Date  . ABDOMINAL ANGIOGRAM N/A 04/20/2013   Procedure: ABDOMINAL ANGIOGRAM;  Surgeon: Angelia Mould, MD;  Location: [facility] C[facility] LAB;  Service: Cardiovascular;  Laterality: N/A;  . ABDOMINAL AORTOGRAM W/LOWER EXTREMITY N/A 11/20/2016   Procedure: ABDOMINAL AORTOGRAM W/LOWER EXTREMITY;  Surgeon: Serafina Mitchell, MD;  Location: [facility] CV LAB;  Service: Cardiovascular;  Laterality: N/A;  . CATARACT EXTRACTION Right 02/2013  . CATARACT EXTRACTION W/ INTRAOCULAR LENS IMPLANT Left 01/2013  . COLON RESECTION  1997   /enc. notes 05/17/2004 (04/28/2013)  . COLON SURGERY  1996   cancer  . ESOPHAGOGASTRODUODENOSCOPY N/A 09/05/2013   Procedure: ESOPHAGOGASTRODUODENOSCOPY (EGD);  Surgeon: Lear Ng, MD;  Location: [facility] ENDOSCOPY;  Service: Endoscopy;  Laterality: N/A;  . FEMORAL ARTERY - FEMORAL ARTERY BYPASS GRAFT     right to left/enc. notes 05/17/2004 (04/28/2013)  . FEMORAL BYPASS Right    /enc. notes 05/17/2004 (04/28/2013)  . FEMORAL-POPLITEAL BYPASS GRAFT Right 04/29/2013   Procedure:  FEMORAL-POPLITEAL ARTERY Bypass Graft with intraoperative ultrasound and arteriogarm;  Surgeon: Mal Misty, MD;  Location: [facility];  Service: Vascular;  Laterality: Right;   . FIBEROPTIC BRONCHOSCOPY     Yvette Rack. notes 03/16/2005 (04/28/2013)  . INTRAOPERATIVE ARTERIOGRAM Right 04/29/2013   Procedure: INTRA OPERATIVE ARTERIOGRAM;  Surgeon: Mal Misty, MD;  Location: [facility];  Service: Vascular;  Laterality: Right;  . LOWER EXTREMITY ANGIOGRAM  04/20/2013   Procedure: LOWER EXTREMITY ANGIOGRAM;  Surgeon: Angelia Mould, MD;  Location: [facility] CATH LAB;  Service: Cardiovascular;;  . LOWER EXTREMITY ANGIOGRAPHY Bilateral 06/25/2017   Procedure: LOWER EXTREMITY ANGIOGRAPHY;  Surgeon: Serafina Mitchell, MD;  Location: [facility] CV LAB;  Service: Cardiovascular;  Laterality: Bilateral;  . [place] SURGERY  2005  . PERCUTANEOUS STENT INTERVENTION  04/20/2013   Procedure: PERCUTANEOUS STENT INTERVENTION;  Surgeon: Angelia Mould, MD;  Location: [facility] CATH LAB;  Service: Cardiovascular;;  right common iliac artery  . PERIPHERAL VASCULAR CATHETERIZATION N/A 01/31/2016   Procedure: Abdominal Aortogram w/Lower Extremity;  Surgeon: Serafina Mitchell, MD;  Location: [facility] CV LAB;  Service: Cardiovascular;  Laterality: N/A;  . PERIPHERAL VASCULAR CATHETERIZATION Right 01/31/2016   Procedure: Peripheral Vascular Atherectomy;  Surgeon: Serafina Mitchell, MD;  Location: [place] CV LAB;  Service: Cardiovascular;  Laterality: Right;  Iliac common and external  . PERIPHERAL VASCULAR CATHETERIZATION Right 01/31/2016   Procedure: Peripheral Vascular Intervention;  Surgeon: Serafina Mitchell, MD;  Location: [facility] CV LAB;  Service: Cardiovascular;  Laterality: Right;  external iliac and common iliac  . PERIPHERAL VASCULAR CATHETERIZATION N/A 04/24/2016   Procedure: Abdominal Aortogram w/Lower Extremity;  Surgeon: Serafina Mitchell, MD;  Location: [place] CV LAB;  Service:  Cardiovascular;  Laterality: N/A;  . PERIPHERAL VASCULAR CATHETERIZATION Right 04/24/2016   Procedure: Peripheral Vascular Intervention;  Surgeon: Serafina Mitchell, MD;  Location: [place] CV LAB;  Service:  Cardiovascular;  Laterality: Right;  external illiac Rt  . SPINE SURGERY    . VASCULAR SURGERY     HPI:  83 year old male admitted with gross hematuria, etiology unclear. Past medical history of dementia, peripheral vascular disease status post right to left femoral-femoral graft, right iliac stents, chronic systolic CHF, COPD, type 2 diabetes, history of colon cancer status post resection with hematuria. Assessed by SLP 12/11/13 with recommendation for dys 3/thin liquids. Referred for swallowing evaluation.   Assessment / Plan / Recommendation Clinical Impression   Patient presents with moderate risk for aspiration due to altered mental status, lethargy. SLP able to rouse with verbal tactile cues, and pt opens his eyes for brief periods, responding to simple questions ~50% of the time, follows 1/10 basic commands. Pt did accept ice chip from spoon, with poor awareness of bolus but  manipulated with verbal cues, initiating a pharyngeal swallow with immediate cough response. [name] reports when feeding pt last night "it seems like he's not swallowing it," and "things coming back up." SLP educated re: risk for aspiration in setting of altered mentation, decreased arousal. Wife denies history of dysphagia prior to admission. Suspect with improvements in mentation pt will be able to resume POs, however recommend NPO for now due to aspiration risk given current mentation. Discussed with wife, RN; will follow up next date.    SLP Visit Diagnosis: Dysphagia, unspecified (R13.10)    Aspiration Risk  Moderate aspiration risk    Diet Recommendation NPO   Medication Administration: Via alternative means    Other  Recommendations Oral Care Recommendations: Oral care QID   Follow up Recommendations Other (comment)(tba)      Frequency and Duration min 2x/week  2 weeks       Prognosis Prognosis for Safe Diet Advancement: Good Barriers to Reach Goals: Cognitive deficits      Swallow Study    General Date of Onset: 09/19/17 HPI: 83 year old male admitted with gross hematuria, etiology unclear. Past medical history of dementia, peripheral vascular disease status post right to left femoral-femoral graft, right iliac stents, chronic systolic CHF, COPD, type 2 diabetes, history of colon cancer status post resection with hematuria. Assessed by SLP 12/11/13 with recommendation for dys 3/thin liquids. Referred for swallowing evaluation. Type of Study: Bedside Swallow Evaluation Previous Swallow Assessment: see HPI Diet Prior to this Study: Regular;Thin liquids Temperature Spikes Noted: Yes(99.2) Respiratory Status: Room air History of Recent Intubation: No Behavior/Cognition: Lethargic/Drowsy;Doesn't follow directions Oral Cavity Assessment: Within Functional Limits Oral Care Completed by SLP: No Oral Cavity - Dentition: Adequate natural dentition Self-Feeding Abilities: Total assist Patient Positioning: Upright in bed Baseline Vocal Quality: Normal Volitional Cough: Cognitively unable to elicit Volitional Swallow: Unable to elicit    Oral/Motor/Sensory Function Overall Oral Motor/Sensory Function: Other (comment)(pt not following commands)   Ice Chips Ice chips: Impaired Presentation: Spoon Oral Phase Impairments: Poor awareness of bolus Oral Phase Functional Implications: Prolonged oral transit Pharyngeal Phase Impairments: Cough - Immediate   Thin Liquid Thin Liquid: Not tested    Nectar Thick Nectar Thick Liquid: Not tested   Honey Thick Honey Thick Liquid: Not tested   Puree Puree: Not tested   Solid   GO   Solid: Not tested       Deneise Lever, [place], [place] Speech-Language Pathologist [phone]  Royetta Crochet  [place] 09/21/2017,2:13 PM

## 2017-09-21 NOTE — Progress Notes (Addendum)
[facility] PROGRESS NOTE  YOUCEF KLAS WFU:[id number] DOB: 1934/09/15 DOA: 09/19/2017  PCP: Glendale Chard, MD  Brief History/Interval Summary: 83 year old African-American male with a past medical history of dementia, peripheral vascular disease status post right to left femoral-femoral graft, right iliac stents, chronic systolic CHF with EF of 20 to 25%, COPD, type 2 diabetes, history of colon cancer status post resection with hematuria.  Patient was seen by urology and was hospitalized.  He was started on continuous bladder irrigation.  Reason for Visit: Hematuria  Consultants: Urology  Procedures: Insertion of Foley catheter.  Foley catheter removed on 6/29.  Antibiotics: Ceftriaxone  Subjective/Interval History: Patient noted to be somnolent this morning.  He was arousable.  He did open his eyes briefly.  But he went right back to sleep.  Daughter is at the bedside.    ROS: Unable to do due to his dementia and somnolence  Objective:  Vital Signs  Vitals:   09/20/17 1409 09/20/17 1944 09/21/17 0100 09/21/17 0421  BP: (!) 173/75 (!) 180/87 (!) 170/77 (!) 161/72  Pulse: 67 65 68 72  Resp: 12 18  16   Temp: 97.8 F (36.6 C) 98.2 F (36.8 C) 98.8 F (37.1 C) 98.4 F (36.9 C)  TempSrc:  Axillary Oral Oral  SpO2: 100% 100% 100% 96%  Weight:      Height:        Intake/Output Summary (Last 24 hours) at 09/21/2017 0837 Last data filed at 09/21/2017 0805 Gross per 24 hour  Intake 2710.3 ml  Output 2500 ml  Net 210.3 ml   Filed Weights   09/19/17 1732  Weight: 72.3 kg (159 lb 6.3 oz)    General appearance: Sleepy this morning.  Arousable.  No distress. Resp: Clear to auscultation bilaterally.  Diminished air entry at the bases.  However no wheezing appreciated.  No definite crackles.  Normal effort at rest. Cardio: S1-S2 is normal regular.  No S3-S4.  No rubs murmurs or bruit GI: Abdomen is soft.  Nontender nondistended.  Bowel sounds are present.  No masses  organomegaly Extremities: No edema Neurologic: No focal neurological deficits  Lab Results:  Data Reviewed: I have personally reviewed following labs and imaging studies  CBC: Recent Labs  Lab 09/19/17 1038 09/20/17 0555 09/21/17 0421  WBC 8.5 12.7* 20.3*  NEUTROABS 5.4  --   --   HGB 13.6 12.7* 11.8*  HCT 43.6 39.3 37.0*  MCV 90.6 87.5 89.6  PLT 384 407* 202    Basic Metabolic Panel: Recent Labs  Lab 09/19/17 1038 09/20/17 0555 09/21/17 0421  NA 134* 137 140  K 4.7 4.2 4.9  CL 101 108 115*  CO2 22 20* 19*  GLUCOSE 148* 162* 145*  BUN 29* 23 26*  CREATININE 1.48* 1.64* 2.09*  CALCIUM 9.3 8.6* 8.2*    GFR: Estimated Creatinine Clearance: 28.8 mL/min (A) (by C-G formula based on SCr of 2.09 mg/dL (H)).  Liver Function Tests: Recent Labs  Lab 09/19/17 1038  AST 26  ALT 12  ALKPHOS 64  BILITOT 0.8  PROT 7.4  ALBUMIN 3.7    Coagulation Profile: Recent Labs  Lab 09/19/17 2005  INR 1.01    CBG: Recent Labs  Lab 09/20/17 0732 09/20/17 1204 09/20/17 1656 09/20/17 2308 09/21/17 0829  GLUCAP 140* 145* 125* 169* 140*     Recent Results (from the past 240 hour(s))  Urine culture     Status: None   Collection Time: 09/17/17  4:31 PM  Result Value Ref  Range Status   Specimen Description URINE, CLEAN CATCH  Final   Special Requests NONE  Final   Culture   Final    NO GROWTH Performed at [facility], [place] [address], [place], [place] [address]    Report Status 09/18/2017 FINAL  Final  Culture, blood (routine x 2)     Status: None (Preliminary result)   Collection Time: 09/19/17 12:13 PM  Result Value Ref Range Status   Specimen Description BLOOD RIGHT ANTECUBITAL  Final   Special Requests   Final    BOTTLES DRAWN AEROBIC AND ANAEROBIC Blood Culture adequate volume   Culture   Final    NO GROWTH < 24 HOURS Performed at [facility], [place] [address] [place], [place] [address]    Report Status PENDING  Incomplete  Culture, blood  (routine x 2)     Status: None (Preliminary result)   Collection Time: 09/19/17  1:30 PM  Result Value Ref Range Status   Specimen Description BLOOD LEFT ANTECUBITAL  Final   Special Requests   Final    BOTTLES DRAWN AEROBIC AND ANAEROBIC Blood Culture adequate volume   Culture   Final    NO GROWTH < 24 HOURS Performed at [facility], [place] [address], [place], [place] [address]    Report Status PENDING  Incomplete  Urine culture     Status: None   Collection Time: 09/19/17  2:56 PM  Result Value Ref Range Status   Specimen Description URINE, CLEAN CATCH  Final   Special Requests NONE  Final   Culture   Final    NO GROWTH Performed at [facility], [place] [address], [place], [place] [address]    Report Status 09/20/2017 FINAL  Final      Radiology Studies: Ct Renal Stone Study  Result Date: 09/19/2017 CLINICAL DATA:  Gross hematuria.  Urinary retention. EXAM: CT ABDOMEN AND PELVIS WITHOUT CONTRAST TECHNIQUE: Multidetector CT imaging of the abdomen and pelvis was performed following the standard protocol without IV contrast. COMPARISON:  None. FINDINGS: Lower chest: No acute abnormality. Hepatobiliary: No focal liver abnormality is seen. No gallstones, gallbladder wall thickening, or biliary dilatation. Pancreas: Unremarkable. No pancreatic ductal dilatation or surrounding inflammatory changes. Spleen: Normal in size without focal abnormality. Adrenals/Urinary Tract: Adrenal glands appear normal. Kidneys appear normal. No hydronephrosis or renal obstruction is noted. Mild diffuse wall thickening of urinary bladder is noted with small diverticulum seen superiorly. Blood is noted in the dependent portion of the urinary bladder lumen. Stomach/Bowel: Stomach is within normal limits. Appendix appears normal. No evidence of bowel wall thickening, distention, or inflammatory changes. Vascular/Lymphatic: Aortic atherosclerosis. No enlarged abdominal or pelvic lymph nodes. Reproductive:  Prostate is unremarkable. Other: No abdominal wall hernia or abnormality. No abdominopelvic ascites. Musculoskeletal: Postsurgical and degenerative changes are noted in the lower lumbar spine. No acute abnormality is noted. IMPRESSION: No renal or ureteral calculi are noted.  No hydronephrosis is noted. Mild diffuse urinary bladder wall thickening is noted concerning for cystitis. Small bladder diverticulum is noted superiorly. Probable hemorrhage is noted within the urinary bladder lumen; cystoscopy is recommended to rule out possible neoplasm or malignancy. Aortic Atherosclerosis (ICD10-I70.0). Electronically Signed   By: Marijo Conception, M.D.   On: 09/19/2017 15:08     Medications:  Scheduled: . digoxin  0.125 mg Oral Daily  . famotidine  20 mg Oral Daily  . fluticasone furoate-vilanterol  1 puff Inhalation Daily  . insulin aspart  0-5 Units Subcutaneous QHS  . insulin aspart  0-9 Units Subcutaneous TID WC  . metoprolol tartrate  12.5 mg Oral BID  . pravastatin  80 mg Oral Daily   Continuous: . cefTRIAXone (ROCEPHIN)  IV 1 g (09/20/17 1328)   TFT:[id number] **OR** acetaminophen, hydrALAZINE, ondansetron **OR** ondansetron (ZOFRAN) IV, polyethylene glycol  Assessment/Plan:    Gross hematuria Etiology unclear.  Differential diagnosis includes UTI, neoplasm, hematuria in the setting of Plavix.  Urology was consulted.  Foley catheter was placed.  Patient underwent continuous bladder irrigation.  Urine has cleared.  Urology has discontinued Foley catheter this morning.  Continue to monitor for now.  Urine cultures without any growth.  Continue ceftriaxone for another day.  Hemoglobin remains stable.   Acute kidney injury Patient's baseline creatinine around 1.1.  Patient's creatinine was 1.48 at admission.  Creatinine has increased to 2.09 today.  Patient has had very poor oral intake in the last 48 hours.  That is likely contributing.  Apart from all of the above issues.  We will place  him on IV fluids.  Chest x-ray does not show any pulmonary edema.  Monitor urine output closely.  No hydronephrosis noted on CT scan.    Leukocytosis WBC noted to be significantly higher than yesterday.  He is afebrile.  Etiology unclear.  Recheck tomorrow morning.  History of peripheral vascular disease Patient has had multiple interventions including bypass as well as stent placements.  Apparently further interventions are limited by patient's dementia.  Holding Plavix due to hematuria.  History of chronic systolic CHF EF known to be about 20 to 25% per echo about 4 years ago.  Giving IV fluids due to renal failure and poor oral intake.  Monitor volume status closely.  Holding his diuretics.  Noted to be on digoxin.  Dig level was nontoxic.  Type 2 diabetes Holding his home medications.  SSI.  Monitor CBGs.  History of COPD Stable.  History of dementia Appears to be at baseline with perhaps increased agitation during nighttime.  This is likely due to a new environment acute infection and acute illness etc.  Discussed in detail with family.  Patient somnolent this morning likely due to all of the above as well as the fact that he was given Phenergan last night.  Should not be getting any sedative agents.  DVT Prophylaxis: SCDs    Code Status: Full code Family Communication: Discussed with the patient's daughter Disposition Plan: Management as outlined above.  Await further improvement.      LOS: 2 days   [facility] Pager [phone] 09/21/2017, 8:37 AM  If 7PM-7AM, please contact night-coverage at [email or web address], password [facility]

## 2017-09-21 NOTE — Progress Notes (Signed)
[facility] notified that patient had 8 beat run of Vtach bp 165/77 hr 77 patient asymptomatic. Will continue to monitor. Arthor Captain LPN

## 2017-09-21 NOTE — Progress Notes (Signed)
Pt daughter requested small piece of paper tape to be placed on condom cath due to pt not being circumcised. CN & RN aware.

## 2017-09-21 NOTE — Progress Notes (Signed)
MD stated to hold Metoprolol and give PRN med if needed. Arthor Captain LPN

## 2017-09-21 NOTE — Progress Notes (Signed)
[facility] informed that patient is NPO and has order for metoprolol 12.5 mg po. Arthor Captain LPN

## 2017-09-22 LAB — BASIC METABOLIC PANEL
Anion gap: 6 (ref 5–15)
BUN: 22 mg/dL (ref 8–23)
CO2: 19 mmol/L — ABNORMAL LOW (ref 22–32)
CREATININE: 1.59 mg/dL — AB (ref 0.61–1.24)
Calcium: 8.4 mg/dL — ABNORMAL LOW (ref 8.9–10.3)
Chloride: 112 mmol/L — ABNORMAL HIGH (ref 98–111)
GFR calc Af Amer: 46 mL/min — ABNORMAL LOW (ref >60)
GFR calc non Af Amer: 39 mL/min — ABNORMAL LOW (ref >60)
GLUCOSE: 112 mg/dL — AB (ref 70–99)
Potassium: 4.4 mmol/L (ref 3.5–5.1)
SODIUM: 137 mmol/L (ref 135–145)

## 2017-09-22 LAB — CBC WITH DIFFERENTIAL/PLATELET
ABS IMMATURE GRANULOCYTES: 0.2 10*3/uL — AB (ref 0.0–0.1)
BASOS ABS: 0 10*3/uL (ref 0.0–0.1)
BASOS PCT: 0 %
EOS PCT: 0 %
Eosinophils Absolute: 0 10*3/uL (ref 0.0–0.7)
HCT: 37 % — ABNORMAL LOW (ref 39.0–52.0)
HEMOGLOBIN: 11.5 g/dL — AB (ref 13.0–17.0)
Immature Granulocytes: 1 %
Lymphocytes Relative: 6 %
Lymphs Abs: 1 10*3/uL (ref 0.7–4.0)
MCH: 28.1 pg (ref 26.0–34.0)
MCHC: 31.1 g/dL (ref 30.0–36.0)
MCV: 90.5 fL (ref 78.0–100.0)
MONO ABS: 1.5 10*3/uL — AB (ref 0.1–1.0)
MONOS PCT: 10 %
Neutro Abs: 13.4 10*3/uL — ABNORMAL HIGH (ref 1.7–7.7)
Neutrophils Relative %: 83 %
Platelets: 296 10*3/uL (ref 150–400)
RBC: 4.09 MIL/uL — ABNORMAL LOW (ref 4.22–5.81)
RDW: 15.8 % — ABNORMAL HIGH (ref 11.5–15.5)
WBC: 16.1 10*3/uL — ABNORMAL HIGH (ref 4.0–10.5)

## 2017-09-22 LAB — GLUCOSE, CAPILLARY
Glucose-Capillary: 120 mg/dL — ABNORMAL HIGH (ref 70–99)
Glucose-Capillary: 189 mg/dL — ABNORMAL HIGH (ref 70–99)
Glucose-Capillary: 159 mg/dL — ABNORMAL HIGH (ref 70–99)
Glucose-Capillary: 202 mg/dL — ABNORMAL HIGH (ref 70–99)

## 2017-09-22 MED ORDER — HYDRALAZINE HCL 20 MG/ML IJ SOLN
10.0000 mg | INTRAMUSCULAR | Status: DC | PRN
  Administered 2017-09-23 (×3): 10 mg via INTRAVENOUS
  Filled 2017-09-22 (×4): qty 1

## 2017-09-22 MED ORDER — RESOURCE THICKENUP CLEAR PO POWD
ORAL | Status: DC | PRN
  Filled 2017-09-22 (×2): qty 125

## 2017-09-22 NOTE — Progress Notes (Signed)
Elevated bp of 193/82, administered 10mg  hydralazine IV per order. BP remained elevated at 191/95. Scanned bladder, pt scanned for 476ml. Notified MD who ordered one time straight catheterization, which returned 575 ml of red and brown tinged urine with many clots present. On call provider notified.

## 2017-09-22 NOTE — Progress Notes (Signed)
  Speech Language Pathology Treatment: Dysphagia  Patient Details Name: [name] MRN: [id number] DOB: 12/18/1934 Today's Date: 09/22/2017 Time: 0805-0829 SLP Time Calculation (min) (ACUTE ONLY): 24 min  Assessment / Plan / Recommendation Clinical Impression  Pt seen for follow-up for dysphagia. Remains confused, mentation is improved however pt requires frequent cues to maintain arousal. Has a congested cough at baseline, RN reports rhonchi this morning. With assistance he is able to self feed cup and straw sips of liquids, teaspoons of puree. Consistent coughing, throat clearing which increases after ice chips, thin liquids regardless of bolus delivery. Intermittent oral holding with cues required occasionally to initiate swallow (verbal and dry spoon x1). Decreased oral holding when pt self-feeds. He tolerated purees and nectar thick liquids much better, with only rare cues to initiate swallow and no overt signs of aspiration. Recommend initiating dys 1, nectar thick liquids by cup, with full supervision to ensure pt remains alert. Assist pt with self feeding as much as he is able. Will follow up for tolerance, advancement as mentation improves. D/w RN.    HPI HPI: 83 year old male admitted with gross hematuria, etiology unclear. Past medical history of dementia, peripheral vascular disease status post right to left femoral-femoral graft, right iliac stents, chronic systolic CHF, COPD, type 2 diabetes, history of colon cancer status post resection with hematuria. Assessed by SLP 12/11/13 with recommendation for dys 3/thin liquids. Referred for swallowing evaluation.      SLP Plan  Continue with current plan of care       Recommendations  Diet recommendations: Dysphagia 1 (puree);Nectar-thick liquid Liquids provided via: Cup Medication Administration: Crushed with puree Supervision: Full supervision/cueing for compensatory strategies;Staff to assist with self feeding Compensations:  Minimize environmental distractions;Slow rate;Small sips/bites(dry spoon to cue swallow if holding) Postural Changes and/or Swallow Maneuvers: Seated upright 90 degrees                Oral Care Recommendations: Oral care BID Follow up Recommendations: Other (comment)(tba) SLP Visit Diagnosis: Dysphagia, unspecified (R13.10) Plan: Continue with current plan of care       [place], [place], [place] Speech-Language Pathologist [phone]  Aliene Altes 09/22/2017, 8:34 AM

## 2017-09-22 NOTE — Progress Notes (Signed)
Pt voiding spontaneously since catheter removed--he has a condom catheter on. Urine amber.  OK to d/c when medically appropriate. We'll see back in office once bladder settles down for cysto.

## 2017-09-22 NOTE — Progress Notes (Signed)
[facility] PROGRESS NOTE  [name] YSA:[id number] DOB: 06/23/1934 DOA: 10-17-17  PCP: Glendale Chard, MD  Brief History/Interval Summary: 83 year old African-American male with a past medical history of dementia, peripheral vascular disease status post right to left femoral-femoral graft, right iliac stents, chronic systolic CHF with EF of 20 to 25%, COPD, type 2 diabetes, history of colon cancer status post resection with hematuria.  Patient was seen by urology and was hospitalized.  He was started on continuous bladder irrigation.  Reason for Visit: Hematuria  Consultants: Urology  Procedures: Insertion of Foley catheter.  Foley catheter removed on 6/29.  Antibiotics: Ceftriaxone  Subjective/Interval History: Patient noted to be wide awake today.  Still confused and distracted.  Daughter at the bedside.     ROS: Unable to do due to his dementia and somnolence  Objective:  Vital Signs  Vitals:   09/21/17 1714 09/21/17 2145 09/21/17 2251 09/22/17 0553  BP:  (!) 161/60 (!) 165/77 (!) 160/51  Pulse:  75  64  Resp:  18  (!) 23  Temp: 98.2 F (36.8 C) 97.7 F (36.5 C)  98.7 F (37.1 C)  TempSrc: Oral Axillary  Axillary  SpO2:    100%  Weight:      Height:        Intake/Output Summary (Last 24 hours) at 09/22/2017 0951 Last data filed at 09/22/2017 0645 Gross per 24 hour  Intake 1002 ml  Output 1125 ml  Net -123 ml   Filed Weights   17-Oct-2017 1732  Weight: 72.3 kg (159 lb 6.3 oz)    General appearance: Patient awake alert.  Distracted. Resp: Upper airway sounds appreciated.  No wheezing or rhonchi.  Normal effort at rest. Cardio: S1-S2 is normal regular.  No S3-S4.  No rubs GI: Abdomen soft.  Nontender nondistended.  Bowel sounds are present.  No masses organomegaly Extremities: No edema Neurologic: No focal neurological deficits  Lab Results:  Data Reviewed: I have personally reviewed following labs and imaging studies  CBC: Recent Labs    Lab 10-17-17 1038 09/20/17 0555 09/21/17 0421 09/22/17 0415  WBC 8.5 12.7* 20.3* 16.1*  NEUTROABS 5.4  --   --  13.4*  HGB 13.6 12.7* 11.8* 11.5*  HCT 43.6 39.3 37.0* 37.0*  MCV 90.6 87.5 89.6 90.5  PLT 384 407* 371 323    Basic Metabolic Panel: Recent Labs  Lab 10-17-2017 1038 09/20/17 0555 09/21/17 0421 09/22/17 0415  NA 134* 137 140 137  K 4.7 4.2 4.9 4.4  CL 101 108 115* 112*  CO2 22 20* 19* 19*  GLUCOSE 148* 162* 145* 112*  BUN 29* 23 26* 22  CREATININE 1.48* 1.64* 2.09* 1.59*  CALCIUM 9.3 8.6* 8.2* 8.4*    GFR: Estimated Creatinine Clearance: 37.9 mL/min (A) (by C-G formula based on SCr of 1.59 mg/dL (H)).  Liver Function Tests: Recent Labs  Lab 2017/10/17 1038  AST 26  ALT 12  ALKPHOS 64  BILITOT 0.8  PROT 7.4  ALBUMIN 3.7    Coagulation Profile: Recent Labs  Lab 10-17-2017 2005  INR 1.01    CBG: Recent Labs  Lab 09/21/17 0829 09/21/17 1145 09/21/17 1808 09/21/17 2208 09/22/17 0828  GLUCAP 140* 151* 133* 117* 120*     Recent Results (from the past 240 hour(s))  Urine culture     Status: None   Collection Time: 09/17/17  4:31 PM  Result Value Ref Range Status   Specimen Description URINE, CLEAN CATCH  Final   Special Requests NONE  Final   Culture   Final    NO GROWTH Performed at [facility], [place] [address] [place], [place] [address]    Report Status 09/18/2017 FINAL  Final  Culture, blood (routine x 2)     Status: None (Preliminary result)   Collection Time: 09/19/17 12:13 PM  Result Value Ref Range Status   Specimen Description BLOOD RIGHT ANTECUBITAL  Final   Special Requests   Final    BOTTLES DRAWN AEROBIC AND ANAEROBIC Blood Culture adequate volume   Culture   Final    NO GROWTH 2 DAYS Performed at [facility], [place] [address], [place], [place] [address]    Report Status PENDING  Incomplete  Culture, blood (routine x 2)     Status: None (Preliminary result)   Collection Time: 09/19/17  1:30 PM  Result Value  Ref Range Status   Specimen Description BLOOD LEFT ANTECUBITAL  Final   Special Requests   Final    BOTTLES DRAWN AEROBIC AND ANAEROBIC Blood Culture adequate volume   Culture   Final    NO GROWTH 2 DAYS Performed at [facility], [place] [address], [place], [place] [address]    Report Status PENDING  Incomplete  Urine culture     Status: None   Collection Time: 09/19/17  2:56 PM  Result Value Ref Range Status   Specimen Description URINE, CLEAN CATCH  Final   Special Requests NONE  Final   Culture   Final    NO GROWTH Performed at [facility], [place] [address], [place], [place] [address]    Report Status 09/20/2017 FINAL  Final      Radiology Studies: Dg Chest Port 1 View  Result Date: 09/21/2017 CLINICAL DATA:  Dyspnea. EXAM: PORTABLE CHEST 1 VIEW COMPARISON:  05/04/2017 and prior studies. FINDINGS: Cardiomediastinal silhouette is normal. Mediastinal contours appear intact. Calcific atherosclerotic disease of the aorta. There is no evidence of pleural effusion or pneumothorax. Low lung volumes. Multiple tiny hyperdense nodules overlie bilateral lung bases, right greater than left. Osseous structures are without acute abnormality. Soft tissues are grossly normal. IMPRESSION: Multiple tiny hyperdense nodules overlie bilateral lung bases, right greater than left. These may represent pleural calcifications, pleuroparenchymal scarring or high density nodules along the bronchovascular tree of the lower lobes. Calcific atherosclerotic disease and tortuosity of the aorta. Electronically Signed   By: Fidela Salisbury M.D.   On: 09/21/2017 11:16     Medications:  Scheduled: . digoxin  0.125 mg Oral Daily  . famotidine  20 mg Oral Daily  . fluticasone furoate-vilanterol  1 puff Inhalation Daily  . insulin aspart  0-5 Units Subcutaneous QHS  . insulin aspart  0-9 Units Subcutaneous TID WC  . metoprolol tartrate  12.5 mg Oral BID  . pravastatin  80 mg Oral Daily    Continuous: . sodium chloride 100 mL/hr at 09/22/17 0035  . cefTRIAXone (ROCEPHIN)  IV 1 g (09/21/17 1418)   EHO:ZYYQMGNOIBBCW **OR** acetaminophen, hydrALAZINE, ondansetron **OR** ondansetron (ZOFRAN) IV, polyethylene glycol, RESOURCE THICKENUP CLEAR  Assessment/Plan:    Gross hematuria Etiology unclear.  Differential diagnosis includes UTI, neoplasm, hematuria in the setting of Plavix.  Urology was consulted.  Foley catheter was placed.  Patient underwent continuous bladder irrigation.  Urine has cleared.  Foley catheter was removed on 6/29.  Urine remains clear.  Voiding well.  Continue ceftriaxone for now.  Consider discontinuing tomorrow.  Urine cultures have been without any growth.  Acute kidney injury Patient's baseline creatinine around 1.1.  Patient's creatinine was 1.48 at admission.  Creatinine climbed up to 2.09.  Patient was given gentle IV hydration.  Renal function has improved.  He has good urine output.  Cut back on fluids.  Chest x-ray does not show any pulmonary edema.  Monitor urine output closely.  No hydronephrosis noted on CT scan.    Leukocytosis WBCs currently climbed up to 20.3 day.  Etiology unclear.  He was afebrile.  Probably reactive.  Improved to 16.1 today.  Continue to monitor.    History of peripheral vascular disease Patient has had multiple interventions including bypass as well as stent placements.  Apparently further interventions are limited by patient's dementia.  Holding Plavix due to hematuria.  History of chronic systolic CHF EF known to be about 20 to 25% per echo about 4 years ago.  He was given IV fluids due to renal failure and poor oral intake.  Monitor volume status closely.  Holding his diuretics.  Noted to be on digoxin.  Dig level was nontoxic.  Type 2 diabetes Holding his home medications.  SSI.  Monitor CBGs.  History of COPD Stable.  History of dementia Appears to be at baseline with perhaps increased agitation during  nighttime.  This is likely due to a new environment acute infection and acute illness etc.  Discussed in detail with family.  The patient was somnolent yesterday, possibly due to the Phenergan he received the previous night.  Much more awake alert today.  Avoid sedative agents.  DVT Prophylaxis: SCDs    Code Status: Full code Family Communication: Discussed with the patient's daughter Disposition Plan: Appears to be improving.  PT evaluation.  Anticipate discharge tomorrow if he continues to improve.    LOS: 3 days   [facility] Pager [phone] 09/22/2017, 9:51 AM  If 7PM-7AM, please contact night-coverage at [email or web address], password [facility]

## 2017-09-23 ENCOUNTER — Encounter: Payer: Self-pay | Admitting: *Deleted

## 2017-09-23 ENCOUNTER — Other Ambulatory Visit: Payer: Self-pay

## 2017-09-23 ENCOUNTER — Inpatient Hospital Stay (HOSPITAL_COMMUNITY): Payer: PPO

## 2017-09-23 DIAGNOSIS — K567 Ileus, unspecified: Secondary | ICD-10-CM

## 2017-09-23 DIAGNOSIS — I10 Essential (primary) hypertension: Secondary | ICD-10-CM

## 2017-09-23 LAB — BASIC METABOLIC PANEL
ANION GAP: 10 (ref 5–15)
BUN: 29 mg/dL — AB (ref 8–23)
CO2: 18 mmol/L — ABNORMAL LOW (ref 22–32)
CREATININE: 1.75 mg/dL — AB (ref 0.61–1.24)
Calcium: 8.7 mg/dL — ABNORMAL LOW (ref 8.9–10.3)
Chloride: 107 mmol/L (ref 98–111)
GFR calc Af Amer: 41 mL/min — ABNORMAL LOW (ref >60)
GFR, EST NON AFRICAN AMERICAN: 35 mL/min — AB (ref >60)
GLUCOSE: 187 mg/dL — AB (ref 70–99)
Potassium: 4.4 mmol/L (ref 3.5–5.1)
Sodium: 135 mmol/L (ref 135–145)

## 2017-09-23 LAB — GLUCOSE, CAPILLARY
Glucose-Capillary: 177 mg/dL — ABNORMAL HIGH (ref 70–99)
Glucose-Capillary: 200 mg/dL — ABNORMAL HIGH (ref 70–99)
Glucose-Capillary: 172 mg/dL — ABNORMAL HIGH (ref 70–99)
Glucose-Capillary: 164 mg/dL — ABNORMAL HIGH (ref 70–99)

## 2017-09-23 LAB — CBC
HCT: 39.1 % (ref 39.0–52.0)
Hemoglobin: 12.6 g/dL — ABNORMAL LOW (ref 13.0–17.0)
MCH: 28.3 pg (ref 26.0–34.0)
MCHC: 32.2 g/dL (ref 30.0–36.0)
MCV: 87.9 fL (ref 78.0–100.0)
PLATELETS: 369 10*3/uL (ref 150–400)
RBC: 4.45 MIL/uL (ref 4.22–5.81)
RDW: 15.9 % — ABNORMAL HIGH (ref 11.5–15.5)
WBC: 17.4 10*3/uL — AB (ref 4.0–10.5)

## 2017-09-23 MED ORDER — MORPHINE SULFATE (PF) 2 MG/ML IV SOLN
1.0000 mg | Freq: Once | INTRAVENOUS | Status: AC
  Administered 2017-09-23: 1 mg via INTRAVENOUS
  Filled 2017-09-23: qty 1

## 2017-09-23 MED ORDER — BISACODYL 10 MG RE SUPP
10.0000 mg | Freq: Once | RECTAL | Status: AC
  Administered 2017-09-23: 10 mg via RECTAL
  Filled 2017-09-23: qty 1

## 2017-09-23 MED ORDER — DOCUSATE SODIUM 100 MG PO CAPS
100.0000 mg | ORAL_CAPSULE | Freq: Two times a day (BID) | ORAL | Status: DC
  Administered 2017-09-23 (×2): 100 mg via ORAL
  Filled 2017-09-23 (×2): qty 1

## 2017-09-23 MED ORDER — SODIUM CHLORIDE 0.9 % IV BOLUS
500.0000 mL | Freq: Once | INTRAVENOUS | Status: AC
  Administered 2017-09-23: 500 mL via INTRAVENOUS

## 2017-09-23 MED ORDER — POLYETHYLENE GLYCOL 3350 17 G PO PACK
17.0000 g | PACK | Freq: Two times a day (BID) | ORAL | Status: DC
  Administered 2017-09-23 (×2): 17 g via ORAL
  Filled 2017-09-23 (×2): qty 1

## 2017-09-23 MED ORDER — FLEET ENEMA 7-19 GM/118ML RE ENEM
1.0000 | ENEMA | Freq: Every day | RECTAL | Status: DC | PRN
  Administered 2017-09-23: 1 via RECTAL
  Filled 2017-09-23: qty 1

## 2017-09-23 MED ORDER — TAMSULOSIN HCL 0.4 MG PO CAPS
0.4000 mg | ORAL_CAPSULE | Freq: Every day | ORAL | Status: DC
  Administered 2017-09-23: 0.4 mg via ORAL
  Filled 2017-09-23: qty 1

## 2017-09-23 NOTE — Progress Notes (Signed)
Patient with no urine output. Bladder scan was zero. Patient given PRN hydralazine per orders with a goal of SBP 170's per on call provider. Patient is respiration is at 30, patient chest present with rhonchi.  Patient also is having some tenderness to his abdomen. Chest xray and abdominal x ray ordered. Will continue to monitor urinary output and BP.

## 2017-09-23 NOTE — NC FL2 (Signed)
[place] LEVEL OF CARE SCREENING TOOL     IDENTIFICATION  Patient Name: [name] Birthdate: 01/20/1935 Sex: male Admission Date (Current Location): 09/19/2017  [facility] and [place] Number:  Herbalist and Address:  [facility], [place] [address] [place], [place] [address]      Provider Number: [id number]  Attending Physician Name and Address:  Bonnielee Haff, MD  Relative Name and Phone Number:  [name], spouse, [phone]    Current Level of Care: Hospital Recommended Level of Care: [place] Prior Approval Number:    Date Approved/Denied:   PASRR Number: [id number]  Discharge Plan: SNF    Current Diagnoses: Patient Active Problem List   Diagnosis Date Noted  . Hematuria 09/19/2017  . Gross hematuria 09/19/2017  . Sepsis ([place]) 05/02/2017  . Diabetes mellitus ([place]) 12/25/2013  . Type I (juvenile type) diabetes mellitus with peripheral circulatory disorders, not stated as uncontrolled(250.71) 12/18/2013  . Acute DVT (deep venous thrombosis) ([place]) 12/12/2013  . PNA (pneumonia) 12/11/2013  . Candida esophagitis ([place]) 09/06/2013  . Chronic systolic CHF (congestive heart failure) ([place]) 09/06/2013  . CAP (community acquired pneumonia) 09/05/2013  . COPD exacerbation ([place]) 09/04/2013  . Microcytic anemia 09/03/2013  . Chronic respiratory failure with hypoxia ([place]) 09/01/2013  . Symptomatic anemia 09/01/2013  . DOE (dyspnea on exertion) 09/01/2013  . Atherosclerosis of native arteries of the extremities with ulceration ([place]) 06/24/2013  . PAD (peripheral artery disease) ([place]) 04/28/2013  . Chest pain 03/23/2013  . Nausea with vomiting 03/23/2013  . Pain in joint, ankle and foot 03/23/2013  . Dementia 07/04/2012  . URI (upper respiratory infection) 07/04/2012  . PVD (peripheral vascular disease) ([place]) 04/21/2012  . Leg pain 04/18/2012  . Upper airway cough syndrome 06/02/2007  . Hyperlipidemia  05/07/2007  . PVD 05/07/2007  . COPD COPD II  05/07/2007  . DYSPNEA 05/07/2007  . Essential hypertension 05/07/2007    Orientation RESPIRATION BLADDER Height & Weight     Self  Normal Incontinent, External catheter Weight: 72.3 kg (159 lb 6.3 oz) Height:  6\' 1"  (185.4 cm)  BEHAVIORAL SYMPTOMS/MOOD NEUROLOGICAL BOWEL NUTRITION STATUS  (Dementia)   Continent Diet(Please see DC Summary)  AMBULATORY STATUS COMMUNICATION OF NEEDS Skin   Extensive Assist Verbally PU Stage and Appropriate Care(Stage I on heel;)                       Personal Care Assistance Level of Assistance  Bathing, Dressing, Feeding Bathing Assistance: Maximum assistance Feeding assistance: Maximum assistance Dressing Assistance: Maximum assistance     Functional Limitations Info  Sight, Speech, Hearing Sight Info: Adequate Hearing Info: Adequate Speech Info: Adequate    SPECIAL CARE FACTORS FREQUENCY  PT (By licensed PT), OT (By licensed OT)     PT Frequency: 5x/week OT Frequency: 3x/week            Contractures Contractures Info: Not present    Additional Factors Info  Code Status, Allergies, Insulin Sliding Scale Code Status Info: Full Allergies Info: NKA   Insulin Sliding Scale Info: 3x daily with meals at bedtime       Current Medications (09/23/2017):  This is the current hospital active medication list Current Facility-Administered Medications  Medication Dose Route Frequency Provider Last Rate Last Dose  . 0.45 % sodium chloride infusion   Intravenous Continuous Bonnielee Haff, MD 100 mL/hr at 09/23/17 1027    . acetaminophen (TYLENOL) tablet 650 mg  650 mg  Oral Q6H PRN Purohit, Konrad Dolores, MD   650 mg at 09/22/17 2048   Or  . acetaminophen (TYLENOL) suppository 650 mg  650 mg Rectal Q6H PRN Purohit, Shrey C, MD      . cefTRIAXone (ROCEPHIN) 1 g in sodium chloride 0.9 % 100 mL IVPB  1 g Intravenous Q24H Bonnielee Haff, MD 200 mL/hr at 09/22/17 1259 1 g at 09/22/17 1259  . digoxin  (LANOXIN) tablet 0.125 mg  0.125 mg Oral Daily Purohit, Shrey C, MD   0.125 mg at 09/23/17 1032  . docusate sodium (COLACE) capsule 100 mg  100 mg Oral BID Bonnielee Haff, MD   100 mg at 09/23/17 1033  . famotidine (PEPCID) tablet 20 mg  20 mg Oral Daily Purohit, Shrey C, MD   20 mg at 09/23/17 1032  . fluticasone furoate-vilanterol (BREO ELLIPTA) 100-25 MCG/INH 1 puff  1 puff Inhalation Daily Purohit, Konrad Dolores, MD   1 puff at 09/23/17 0905  . hydrALAZINE (APRESOLINE) injection 10 mg  10 mg Intravenous Q4H PRN Gardiner Barefoot, NP   10 mg at 09/23/17 0556  . insulin aspart (novoLOG) injection 0-5 Units  0-5 Units Subcutaneous QHS Purohit, Shrey C, MD   2 Units at 09/22/17 2300  . insulin aspart (novoLOG) injection 0-9 Units  0-9 Units Subcutaneous TID WC Purohit, Konrad Dolores, MD   2 Units at 09/23/17 1043  . metoprolol tartrate (LOPRESSOR) tablet 12.5 mg  12.5 mg Oral BID Purohit, Shrey C, MD   12.5 mg at 09/23/17 1033  . ondansetron (ZOFRAN) tablet 4 mg  4 mg Oral Q6H PRN Purohit, Shrey C, MD       Or  . ondansetron (ZOFRAN) injection 4 mg  4 mg Intravenous Q6H PRN Purohit, Konrad Dolores, MD   4 mg at 09/21/17 0939  . polyethylene glycol (MIRALAX / GLYCOLAX) packet 17 g  17 g Oral BID Bonnielee Haff, MD   17 g at 09/23/17 1039  . pravastatin (PRAVACHOL) tablet 80 mg  80 mg Oral Daily Purohit, Shrey C, MD   80 mg at 09/23/17 1031  . [place]   Oral PRN Bonnielee Haff, MD      . sodium phosphate (FLEET) 7-19 GM/118ML enema 1 enema  1 enema Rectal Daily PRN Bonnielee Haff, MD      . tamsulosin (FLOMAX) capsule 0.4 mg  0.4 mg Oral QPC supper Kathie Rhodes, MD         Discharge Medications: Please see discharge summary for a list of discharge medications.  Relevant Imaging Results:  Relevant Lab Results:   Additional Information SS# [id number]  Benard Halsted, LCSWA

## 2017-09-23 NOTE — Progress Notes (Signed)
Bladder scanned for 0cc. Multiple attempts resulted in 0cc. Pt's abdomen taut and tender to palpation.  Condom cath replaced, scant amount of clear yellow urine out.  Gave enema. Pt unable to retain enema. Immediate return of small amount of fluid with smears of soft stool. Three nuggets of hard stool passed as well. Peri care given.  Wife at bedside, updated about plan. MD updated.

## 2017-09-23 NOTE — Progress Notes (Signed)
SLP Cancellation Note  Patient Details Name: [name] MRN: [id number] DOB: [birth year]   Cancelled treatment:       Reason Eval/Treat Not Completed: Medical issues which prohibited therapy (pt now NPO due to concern for ileus). Will f/u as able.   [name] 09/23/2017, 10:05 AM  [name], M.A. CCC-SLP [phone]

## 2017-09-23 NOTE — Evaluation (Signed)
Physical Therapy Evaluation-- Late Entry Patient Details Name: [name] MRN: [id number] DOB: Nov 24, 1934 Today's Date: 09/23/2017   History of Present Illness  Pt is an 83 y.o. male admitted 06/25/17 now s/p LE angiography. PMH includes Alzheimer's dementia, CHF, COPD, HTN, PVD, DM.  Clinical Impression  Pt admitted with above, presents with significant mobility deficits.  No family at bedside to determine baseline.  Per NT pt from facility.  Will continue to follow for acute PT for mobility progression.     Follow Up Recommendations SNF;Supervision/Assistance - 24 hour    Equipment Recommendations  None recommended by PT    Recommendations for Other Services       Precautions / Restrictions Precautions Precautions: Fall Restrictions Weight Bearing Restrictions: No      Mobility  Bed Mobility Overal bed mobility: Needs Assistance Bed Mobility: Rolling Rolling: Max assist         General bed mobility comments: assist for rolling and scooting to [facility] with +2 and draw sheet  Transfers                    Ambulation/Gait                Stairs            Wheelchair Mobility    Modified Rankin (Stroke Patients Only)       Balance                                             Pertinent Vitals/Pain Faces Pain Scale: No hurt    Home Living Family/patient expects to be discharged to:: Skilled nursing facility                 Additional Comments: Per chart, pt from facility    Prior Function Level of Independence: Needs assistance         Comments: pt unable to provide accurate history due to cognitive deficits, no caregiver present in room     Hand Dominance        Extremity/Trunk Assessment                Communication   Communication: Other (comment)  Cognition Arousal/Alertness: Lethargic   Overall Cognitive Status: History of cognitive impairments - at baseline                                  General Comments: inconsistently follows one step commands with max multimodal cues      General Comments      Exercises General Exercises - Lower Extremity Straight Leg Raises: Both;5 reps;Supine   Assessment/Plan    PT Assessment Patient needs continued PT services  PT Problem List Decreased strength;Decreased range of motion;Decreased activity tolerance;Decreased balance;Decreased mobility;Decreased coordination;Decreased cognition;Decreased safety awareness       PT Treatment Interventions DME instruction;Gait training;Functional mobility training;Therapeutic activities;Therapeutic exercise;Balance training;Cognitive remediation;Patient/family education    PT Goals (Current goals can be found in the Care Plan section)  Acute Rehab PT Goals Patient Stated Goal: none stated PT Goal Formulation: Patient unable to participate in goal setting Time For Goal Achievement: 10/04/17 Potential to Achieve Goals: Fair    Frequency Min 2X/week   Barriers to discharge        Co-evaluation  AM-PAC PT "6 Clicks" Daily Activity  Outcome Measure Difficulty turning over in bed (including adjusting bedclothes, sheets and blankets)?: A Lot Difficulty moving from lying on back to sitting on the side of the bed? : A Lot Difficulty sitting down on and standing up from a chair with arms (e.g., wheelchair, bedside commode, etc,.)?: A Lot Help needed moving to and from a bed to chair (including a wheelchair)?: Total Help needed walking in hospital room?: Total Help needed climbing 3-5 steps with a railing? : Total 6 Click Score: 9    End of Session   Activity Tolerance: Patient limited by fatigue Patient left: in bed;with call bell/phone within reach;with nursing/sitter in room Nurse Communication: Mobility status;Need for lift equipment PT Visit Diagnosis: Muscle weakness (generalized) (M62.81);History of falling (Z91.81)    Time:  -       Charges:         PT G Codes:          Michel Santee 09/23/2017, 9:50 AM

## 2017-09-23 NOTE — Progress Notes (Signed)
[facility] PROGRESS NOTE  [name]:[id number] DOB: May 28, 1934 DOA: 09/19/2017  PCP: Glendale Chard, MD  Brief History/Interval Summary: 83 year old African-American male with a past medical history of dementia, peripheral vascular disease status post right to left femoral-femoral graft, right iliac stents, chronic systolic CHF with EF of 20 to 25%, COPD, type 2 diabetes, history of colon cancer status post resection with hematuria.  Patient was seen by urology and was hospitalized.  He was started on continuous bladder irrigation.  Reason for Visit: Hematuria  Consultants: Urology  Procedures: Insertion of Foley catheter.  Foley catheter removed on 6/29.  Antibiotics: Ceftriaxone  Subjective/Interval History: Patient noted to be awake distracted.  Wife and daughter at the bedside.  Overnight events noted.  Wife had multiple questions which were answered.  ROS: Unable to do due to his dementia  Objective:  Vital Signs  Vitals:   09/22/17 2330 09/23/17 0100 09/23/17 0541 09/23/17 0650  BP: (!) 198/117 (!) 175/73 (!) 185/84 (!) 171/75  Pulse: 68  79   Resp: (!) 32 (!) 24 (!) 26   Temp: 98.3 F (36.8 C)  98.4 F (36.9 C)   TempSrc: Oral  Oral   SpO2: 100%  97%   Weight:      Height:        Intake/Output Summary (Last 24 hours) at 09/23/2017 0906 Last data filed at 09/23/2017 0904 Gross per 24 hour  Intake 1411.32 ml  Output 794 ml  Net 617.32 ml   Filed Weights   09/19/17 1732  Weight: 72.3 kg (159 lb 6.3 oz)    General appearance: Awake alert.  Distracted.  In no distress. Resp: Upper airway sounds appreciated on auscultation.  No wheezing or rhonchi.  No definite crackles.  Normal effort at rest. Cardio: S1-S2 is normal regular.  No S3-S4.  No rubs murmurs or bruit GI: Abdomen noted to be distended, mildly tender in the suprapubic area.  Bowel sounds sluggish but present.  No masses organomegaly Extremities: No edema Neurologic: No focal  neurological deficits.  He is disoriented.  Lab Results:  Data Reviewed: I have personally reviewed following labs and imaging studies  CBC: Recent Labs  Lab 09/19/17 1038 09/20/17 0555 09/21/17 0421 09/22/17 0415 09/23/17 0556  WBC 8.5 12.7* 20.3* 16.1* 17.4*  NEUTROABS 5.4  --   --  13.4*  --   HGB 13.6 12.7* 11.8* 11.5* 12.6*  HCT 43.6 39.3 37.0* 37.0* 39.1  MCV 90.6 87.5 89.6 90.5 87.9  PLT 384 407* 371 296 811    Basic Metabolic Panel: Recent Labs  Lab 09/19/17 1038 09/20/17 0555 09/21/17 0421 09/22/17 0415 09/23/17 0556  NA 134* 137 140 137 135  K 4.7 4.2 4.9 4.4 4.4  CL 101 108 115* 112* 107  CO2 22 20* 19* 19* 18*  GLUCOSE 148* 162* 145* 112* 187*  BUN 29* 23 26* 22 29*  CREATININE 1.48* 1.64* 2.09* 1.59* 1.75*  CALCIUM 9.3 8.6* 8.2* 8.4* 8.7*    GFR: Estimated Creatinine Clearance: 34.4 mL/min (A) (by C-G formula based on SCr of 1.75 mg/dL (H)).  Liver Function Tests: Recent Labs  Lab 09/19/17 1038  AST 26  ALT 12  ALKPHOS 64  BILITOT 0.8  PROT 7.4  ALBUMIN 3.7    Coagulation Profile: Recent Labs  Lab 09/19/17 2005  INR 1.01    CBG: Recent Labs  Lab 09/22/17 0828 09/22/17 1242 09/22/17 1714 09/22/17 2122 09/23/17 0746  GLUCAP 120* 189* 159* 202* 177*  Recent Results (from the past 240 hour(s))  Urine culture     Status: None   Collection Time: 09/17/17  4:31 PM  Result Value Ref Range Status   Specimen Description URINE, CLEAN CATCH  Final   Special Requests NONE  Final   Culture   Final    NO GROWTH Performed at [facility], [address] [place], [place] [address]    Report Status 09/18/2017 FINAL  Final  Culture, blood (routine x 2)     Status: None (Preliminary result)   Collection Time: 09/19/17 12:13 PM  Result Value Ref Range Status   Specimen Description BLOOD RIGHT ANTECUBITAL  Final   Special Requests   Final    BOTTLES DRAWN AEROBIC AND ANAEROBIC Blood Culture adequate volume   Culture   Final      NO GROWTH 3 DAYS Performed at [facility], [place] [address] [place], [place] [address]    Report Status PENDING  Incomplete  Culture, blood (routine x 2)     Status: None (Preliminary result)   Collection Time: 09/19/17  1:30 PM  Result Value Ref Range Status   Specimen Description BLOOD LEFT ANTECUBITAL  Final   Special Requests   Final    BOTTLES DRAWN AEROBIC AND ANAEROBIC Blood Culture adequate volume   Culture   Final    NO GROWTH 3 DAYS Performed at [facility], [place] [address], [place], [place] [address]    Report Status PENDING  Incomplete  Urine culture     Status: None   Collection Time: 09/19/17  2:56 PM  Result Value Ref Range Status   Specimen Description URINE, CLEAN CATCH  Final   Special Requests NONE  Final   Culture   Final    NO GROWTH Performed at [facility], [place] [address], [place],  [address]    Report Status 09/20/2017 FINAL  Final      Radiology Studies: Dg Chest Port 1 View  Result Date: 09/21/2017 CLINICAL DATA:  Dyspnea. EXAM: PORTABLE CHEST 1 VIEW COMPARISON:  05/04/2017 and prior studies. FINDINGS: Cardiomediastinal silhouette is normal. Mediastinal contours appear intact. Calcific atherosclerotic disease of the aorta. There is no evidence of pleural effusion or pneumothorax. Low lung volumes. Multiple tiny hyperdense nodules overlie bilateral lung bases, right greater than left. Osseous structures are without acute abnormality. Soft tissues are grossly normal. IMPRESSION: Multiple tiny hyperdense nodules overlie bilateral lung bases, right greater than left. These may represent pleural calcifications, pleuroparenchymal scarring or high density nodules along the bronchovascular tree of the lower lobes. Calcific atherosclerotic disease and tortuosity of the aorta. Electronically Signed   By: Fidela Salisbury M.D.   On: 09/21/2017 11:16   Dg Abd Acute W/chest  Result Date: 09/23/2017 CLINICAL DATA:  Abdominal distension  EXAM: DG ABDOMEN ACUTE W/ 1V CHEST COMPARISON:  09/21/2017, CT 09/19/2017 FINDINGS: Single-view chest demonstrates mild basilar atelectasis. Stable cardiomediastinal silhouette with aortic atherosclerosis. Tiny calcified nodules at the lung bases as before. Supine and upright views of the abdomen demonstrate no free air beneath the diaphragm. Development of multiple loops of dilated air-filled small bowel measuring up to 3.1 cm. Postsurgical changes in the pelvis. Right iliac stent. IMPRESSION: 1. No radiographic evidence for acute cardiopulmonary abnormality. 2. Development of multiple air-filled dilated loops of small bowel with some distal gas present, findings could be secondary to an ileus, however partial small bowel obstruction could also produce this appearance. Electronically Signed   By: Maudie Mercury  Francoise Ceo M.D.   On: 09/23/2017 01:39     Medications:  Scheduled: . bisacodyl  10 mg Rectal Once  . digoxin  0.125 mg Oral Daily  . docusate sodium  100 mg Oral BID  . famotidine  20 mg Oral Daily  . fluticasone furoate-vilanterol  1 puff Inhalation Daily  . insulin aspart  0-5 Units Subcutaneous QHS  . insulin aspart  0-9 Units Subcutaneous TID WC  . metoprolol tartrate  12.5 mg Oral BID  . polyethylene glycol  17 g Oral BID  . pravastatin  80 mg Oral Daily  . tamsulosin  0.4 mg Oral QPC supper   Continuous: . sodium chloride 50 mL/hr at 09/23/17 0552  . cefTRIAXone (ROCEPHIN)  IV 1 g (09/22/17 1259)   YKD:[id number] **OR** acetaminophen, hydrALAZINE, ondansetron **OR** ondansetron (ZOFRAN) IV, RESOURCE THICKENUP CLEAR  Assessment/Plan:   Abdominal distention/ileus/constipation Patient has not had a bowel movement in the last 3 days.  Abdominal films suggested ileus.  Possibly due to constipation.  Patient does not have any nausea vomiting.  Will try suppository laxatives and enema.  If patient starts vomiting he may need NG tube.  All of this was discussed with patient's family.  This  is most likely due to his sedentary status along with acute illness.  Recent CT scan did not show any obstruction.  Gross hematuria Appears to have resolved.  Etiology unclear.  Differential diagnosis includes UTI, neoplasm, hematuria in the setting of Plavix.  Urology was consulted.  Foley catheter was placed.  Patient underwent continuous bladder irrigation.  Once urine started clearing up his Foley catheter was discontinued on 6/29.  Urine remains clear.  He did have an episode of urine retention on 6/30.  In and out catheterization had to be performed.  Flomax has been added by urology.  Continue to monitor for now.  Cystoscopy as an outpatient.  Continue ceftriaxone for now.  Urine cultures have been negative.     Acute kidney injury Patient's baseline creatinine around 1.1.  Patient's creatinine was 1.48 at admission.  Creatinine climbed up to 2.09.  Patient was given gentle IV hydration.  Renal function did improve.  Yesterday again he had poor urine output and creatinine is noted to be higher today.  This is likely due to poor oral intake.  Increased intra-abdominal pressure also likely contributing.  Increase IV fluids.  Respiratory status is stable.  No evidence for pulmonary edema.  CT scan did not show any hydronephrosis.  Monitor urine output.  Leukocytosis Reason for his leukocytosis not entirely clear.  He remains on ceftriaxone.  He remains afebrile.  Cultures have all been negative.  Continue to watch.     History of peripheral vascular disease Patient has had multiple interventions including bypass as well as stent placements.  Apparently further interventions are limited by patient's dementia.  Plavix was held due to hematuria.  Will resume once his medical status is a little bit more stable.  History of chronic systolic CHF EF known to be about 20 to 25% per echo about 4 years ago.  He is requiring IV fluids due to poor oral intake and ileus.  Volume status appears to be stable.   Continue to watch.  Holding his diuretics.  Did level was nontoxic.  Digoxin was continued and his renal function was improving.  If creatinine starts going up we may have to discontinue this medication.  Check weight.  Accelerated hypertension Elevated blood pressure likely secondary to pain.  Continue to  monitor for now.  Hydralazine as needed.  Type 2 diabetes Holding his home medications.  CBGs are stable.  Continue SSI.  History of COPD Stable.  History of dementia Appears to be at baseline with perhaps increased agitation during nighttime.  This is likely due to a new environment acute infection and acute illness etc.. Patient's mental status has improved.  He appears to be close to baseline.  DVT Prophylaxis: SCDs    Code Status: Full code Family Communication: Discussed with patient's wife and daughter Disposition Plan: Patient has had a setback in terms of ileus and constipation.  PT evaluation.    LOS: 4 days   [facility] Pager [phone] 09/23/2017, 9:06 AM  If 7PM-7AM, please contact night-coverage at [email or web address], password [facility]

## 2017-09-23 NOTE — Clinical Social Work Note (Signed)
Clinical Social Work Assessment  Patient Details  Name: [name] MRN: [id number] Date of Birth: 10/19/1934  Date of referral:  09/23/17               Reason for consult:  Facility Placement                Permission sought to share information with:  Facility Sport and exercise psychologist, Family Supports Permission granted to share information::  No  Name::     Journalist, newspaper::  SNFs  Relationship::  Spouse  Contact Information:  [phone]  Housing/Transportation Living arrangements for the past 2 months:  Single Family Home Source of Information:  Spouse Patient Interpreter Needed:  None Criminal Activity/Legal Involvement Pertinent to Current Situation/Hospitalization:  No - Comment as needed Significant Relationships:  Adult Children, Spouse Lives with:  Spouse Do you feel safe going back to the place where you live?  No Need for family participation in patient care:  Yes (Comment)  Care giving concerns:  CSW received consult for possible SNF placement at time of discharge. CSW spoke with patient's spouse at bedside regarding PT recommendation of SNF placement at time of discharge. Patient's spouse reported that patient has fallen at home and she is unable to pick him up. She has to call the neighbor. She would like for patient to get rehab. CSW to continue to follow and assist with discharge planning needs.   Social Worker assessment / plan:  CSW spoke with patient's spouse concerning possibility of rehab at [facility] before returning home.  Employment status:  Retired Surveyor, [employer] Care PT Recommendations:  [place] / Referral to community resources:  [place]  Patient/Family's Response to care:  Patient's spouse recognizes need for rehab before returning home and is agreeable to a SNF in [place]. Patient's spouse reported preference for [facility]. CSW spoke with her about the fact that patient was denied  during his admission in April due to insurance stating he was custodial. She states she has tried to apply for Medicaid but she believes he was denied. CSW contacted CSW through [facility] and [facility] as well. They had been trying to place patient at [facility] but patient ended up in the hospital. CSW alerted patient's spouse that if insurance denied patient again, patient would be private pay for long term care. CSW attempting to contact Medicaid to see if patient has a Product/process development scientist. Patient's wife appears overwhelmed.   Patient/Family's Understanding of and Emotional Response to Diagnosis, Current Treatment, and Prognosis:  Patient/family is realistic regarding therapy needs and expressed being hopeful for SNF placement. Patient's spouse expressed understanding of CSW role and discharge process as well as medical condition. No questions/concerns about plan or treatment.    Emotional Assessment Appearance:  Appears stated age Attitude/Demeanor/Rapport:  Unable to Assess Affect (typically observed):  Unable to Assess Orientation:  Oriented to Self Alcohol / Substance use:  Not Applicable Psych involvement (Current and /or in the community):  No (Comment)  Discharge Needs  Concerns to be addressed:  Care Coordination Readmission within the last 30 days:  No Current discharge risk:  Cognitively Impaired, Dependent with Mobility Barriers to Discharge:  Continued Medical Work up   [employer], [place] 09/23/2017, 1:42 PM

## 2017-09-23 NOTE — Consult Note (Addendum)
   [facility] CM Inpatient Consult   09/23/2017  [name] April 20, 1934 [id number]    [name] is active with [facility] program. [facility] Pharmacist is following for medication assistance. Please see chart review then encounters for patient outreach details.  Went to bedside to speak with [name] about ongoing [facility] services. [name] is agreeable and [facility] Care Management written consent obtained. [facility] Care Management folder provided.   [name] states she is hopeful that [name] is able to go to SNF at discharge. States " I am unable to care for him at home". States she thinks "it's more than a UTI". [name] reports that patient was up walking around and going to adult daycare prior to hospitalization and she is concerned that he is so weak now.  Explained that [facility] will not interfere or replace services provided by home health. [name] states they were active with [facility] but is hopeful [name] will go to rehab post discharge.   Will continue to follow and make appropriate [facility] referrals once disposition is known. [name] agreeable to this plan.   [name] [phone] is primary contact person post discharge.   Marthenia Rolling, MSN-Ed, RN,BSN [facility] Liaison [phone]

## 2017-09-23 NOTE — Progress Notes (Signed)
CSW spoke with Medicaid office. Patient's Medicaid application was denied in April. Will follow up with case worker, Ms. White [phone]) to determine reason for denial/if patient could be approved under facility Medicaid.   Edwin Locus Timofey Carandang LCSW [phone]

## 2017-09-23 NOTE — Progress Notes (Signed)
Physical Therapy Treatment Patient Details Name: [name] MRN: [id number] DOB: Jul 25, 1934 Today's Date: 09/23/2017    History of Present Illness Pt is an 83 y.o. male admitted 06/25/17 now s/p LE angiography. PMH includes Alzheimer's dementia, CHF, COPD, HTN, PVD, DM.    PT Comments    Pt initially much more alert this session than 6/28 session.  Able to follow 1-step commands more consistently, but continues to require mod/max cues and fatigues very quickly.  Able to progress to EOB and standing trials but unable to progress gait at this time 2/2 limited activity tolerance and cognitive deficits.  Continues to requir increased assist for rolling in bed for changing bed linens and donning clean gown (current gown wet).  Discussed PLOF with wife, who states pt has had home health but been in a steady decline over the last 4 months.  Continue to recommend SNF for further rehab prior to returning home to decrease burden of care and fall risk.     Follow Up Recommendations  SNF;Supervision/Assistance - 24 hour     Equipment Recommendations  Other (comment)(tbd at next level of care)    Recommendations for Other Services       Precautions / Restrictions Precautions Precautions: Fall Restrictions Weight Bearing Restrictions: No    Mobility  Bed Mobility Overal bed mobility: Needs Assistance Bed Mobility: Rolling;Supine to Sit;Sit to Supine Rolling: Max assist   Supine to sit: Mod assist Sit to supine: Min assist      Transfers Overall transfer level: Needs assistance Equipment used: 1 person hand held assist Transfers: Sit to/from Stand(3x) Sit to Stand: Mod assist         General transfer comment: requires multiple attempts each trial, use of forward rocking for momentum  Ambulation/Gait                 Stairs             Wheelchair Mobility    Modified Rankin (Stroke Patients Only)       Balance Overall balance assessment: Needs  assistance Sitting-balance support: Bilateral upper extremity supported;Feet supported Sitting balance-Leahy Scale: Poor     Standing balance support: Bilateral upper extremity supported Standing balance-Leahy Scale: Poor                              Cognition Arousal/Alertness: Awake/alert Behavior During Therapy: Flat affect Overall Cognitive Status: History of cognitive impairments - at baseline                                 General Comments: inconsistently follows one step commands with max multimodal cues      Exercises      General Comments        Pertinent Vitals/Pain Faces Pain Scale: No hurt    Home Living                      Prior Function            PT Goals (current goals can now be found in the care plan section) Acute Rehab PT Goals PT Goal Formulation: Patient unable to participate in goal setting Time For Goal Achievement: 10/04/17 Potential to Achieve Goals: Fair    Frequency    Min 2X/week      PT Plan Current plan remains appropriate    Co-evaluation  AM-PAC PT "6 Clicks" Daily Activity  Outcome Measure  Difficulty turning over in bed (including adjusting bedclothes, sheets and blankets)?: A Lot Difficulty moving from lying on back to sitting on the side of the bed? : A Little Difficulty sitting down on and standing up from a chair with arms (e.g., wheelchair, bedside commode, etc,.)?: A Lot Help needed moving to and from a bed to chair (including a wheelchair)?: A Lot Help needed walking in hospital room?: A Lot Help needed climbing 3-5 steps with a railing? : A Lot 6 Click Score: 13    End of Session Equipment Utilized During Treatment: Gait belt Activity Tolerance: Patient limited by fatigue Patient left: in bed;with call bell/phone within reach;with bed alarm set;with family/visitor present Nurse Communication: Mobility status;Need for lift equipment PT Visit Diagnosis:  Muscle weakness (generalized) (M62.81);History of falling (Z91.81)     Time: 1425-1500 PT Time Calculation (min) (ACUTE ONLY): 35 min  Charges:  $Therapeutic Activity: 23-37 mins                    G Codes:        Michel Santee 09/23/2017, 3:23 PM

## 2017-09-23 NOTE — Progress Notes (Signed)
Pt given suppository per MAR. Soft brown stool noted in rectum.  Pt given meds crushed in applesauce per previous speech recs. Pt needed encouragement to swallow but had no difficulties, no coughing, no signs of aspiration. Pt given miralax per MAR - thickened to nectar thick. Pt able to swallow but needed a lot of encouragement to complete the swallow.  Condom cath on, draining clear yellow urine. IVF running at 100cc/hr

## 2017-09-23 NOTE — Progress Notes (Addendum)
   09/23/17 1337 09/23/17 1348  Vitals  BP (!) 207/106 (!) 182/98   First was automatic BP cuff, second was manual recheck. Given prn hydralazine per MAR. Will recheck BP.  Pt remains oriented only to self. Still having low urine output - condom catheter in place, draining scant amounts of clear, yellow urine. Pt bladder scanned for 23mL - mutltiple attempts made, 56mL found multiple times. Pt's abdomen remains distended and tender. No BM yet.  1544 - BP recheck 170/72, condom cath off. Per family, pt had small BM with PT. Will continue to monitor.

## 2017-09-23 NOTE — Progress Notes (Signed)
Patient ID: [name], male   DOB: [birth year], 83 y.o.   MRN: [id number]    Assessment: Gross hematuria: Likely secondary to hemorrhagic cystitis however urine culture on admission was negative.  He was already taking antibiotics at the time the culture was performed.  The clots noted upon irrigation yesterday were likely old blood clots in the bladder as his urine is now completely clear.  I will add Flomax to improve bladder emptying.  Reported low urine output likely prerenal.   Plan:  1.  Begin Flomax. 2.  He will be following up with me as an outpatient for completion of his work-up of hematuria with cystoscopy.   Subjective: Patient sleeping and in no distress.  Wife reported in and out catheterization with irrigation revealed clots.  Also decreased urine output but bladder scan was zero.  Objective: Vital signs in last 24 hours: Temp:  [98 F (36.7 C)-98.4 F (36.9 C)] 98.4 F (36.9 C) (07/01 0541) Pulse Rate:  [62-86] 79 (07/01 0541) Resp:  [18-32] 26 (07/01 0541) BP: (171-198)/(73-117) 171/75 (07/01 0650) SpO2:  [97 %-100 %] 97 % (07/01 0541)A  Intake/Output from previous day: 06/30 0701 - 07/01 0700 In: 1411.3 [I.V.:1314; IV Piggyback:97.3] Out: 794 [Urine:794] Intake/Output this shift: No intake/output data recorded.  Past Medical History:  Diagnosis Date  . Alzheimer's dementia 2013  . Anemia    HX OF ANEMIA  . CHF (congestive heart failure) ([place])   . Colon cancer ([place])   . COPD (chronic obstructive pulmonary disease) ([place])   . Depression   . Dyspnea   . Hyperlipidemia   . Hypertension   . Pneumonia 2014  . PVD (peripheral vascular disease) ([place])   . Type II diabetes mellitus ([facility])     Physical Exam:  General: Awake, alert and in no apparent distress Lungs: Normal respiratory effort, chest expands symmetrically.  Abdomen: Soft, mildly distended and mildly tender. GU: Urine completely clear with no clots.  Lab Results: Recent Labs     09/21/17 0421 09/22/17 0415 09/23/17 0556  WBC 20.3* 16.1* 17.4*  HGB 11.8* 11.5* 12.6*  HCT 37.0* 37.0* 39.1   BMET Recent Labs    09/22/17 0415 09/23/17 0556  NA 137 135  K 4.4 4.4  CL 112* 107  CO2 19* 18*  GLUCOSE 112* 187*  BUN 22 29*  CREATININE 1.59* 1.75*  CALCIUM 8.4* 8.7*   No results for input(s): LABURIN in the last 72 hours. Results for orders placed or performed during the hospital encounter of 09/19/17  Culture, blood (routine x 2)     Status: None (Preliminary result)   Collection Time: 09/19/17 12:13 PM  Result Value Ref Range Status   Specimen Description BLOOD RIGHT ANTECUBITAL  Final   Special Requests   Final    BOTTLES DRAWN AEROBIC AND ANAEROBIC Blood Culture adequate volume   Culture   Final    NO GROWTH 3 DAYS Performed at [facility], [place] [address], [place], [name] [address]    Report Status PENDING  Incomplete  Culture, blood (routine x 2)     Status: None (Preliminary result)   Collection Time: 09/19/17  1:30 PM  Result Value Ref Range Status   Specimen Description BLOOD LEFT ANTECUBITAL  Final   Special Requests   Final    BOTTLES DRAWN AEROBIC AND ANAEROBIC Blood Culture adequate volume   Culture   Final    NO GROWTH 3 DAYS Performed at [facility], [place]  [address] [place], [place] [address]    Report Status PENDING  Incomplete  Urine culture     Status: None   Collection Time: 09/19/17  2:56 PM  Result Value Ref Range Status   Specimen Description URINE, CLEAN CATCH  Final   Special Requests NONE  Final   Culture   Final    NO GROWTH Performed at [facility], [place] [address] [place], [place] [address]    Report Status 09/20/2017 FINAL  Final    Studies/Results: Dg Abd Acute W/chest  Result Date: 09/23/2017 CLINICAL DATA:  Abdominal distension EXAM: DG ABDOMEN ACUTE W/ 1V CHEST COMPARISON:  09/21/2017, CT 09/19/2017 FINDINGS: Single-view chest demonstrates mild basilar atelectasis. Stable  cardiomediastinal silhouette with aortic atherosclerosis. Tiny calcified nodules at the lung bases as before. Supine and upright views of the abdomen demonstrate no free air beneath the diaphragm. Development of multiple loops of dilated air-filled small bowel measuring up to 3.1 cm. Postsurgical changes in the pelvis. Right iliac stent. IMPRESSION: 1. No radiographic evidence for acute cardiopulmonary abnormality. 2. Development of multiple air-filled dilated loops of small bowel with some distal gas present, findings could be secondary to an ileus, however partial small bowel obstruction could also produce this appearance. Electronically Signed   By: Donavan Foil M.D.   On: 09/23/2017 01:39      [name] C 09/23/2017, 7:18 AM

## 2017-09-23 NOTE — Patient Outreach (Signed)
[facility]) Care Management  09/23/2017  [name] 1934-11-20 [id number]  BSW received an incoming call from [facility] inpatient CSW. Patient is currently admitted. CSW inquired on status of Medicaid application. BSW explained that this BSW had not assisted the patient with a Medicaid application. BSW noted a recent phone outreach with the patients wife who indicated the patient was being assisted by a Education officer, museum not affiliated with Psychologist, educational [facility]). According to the patients wife, that social worker was assisting with long-term placement. BSW further explained that this BSW had performed a discipline closure in attempts to limit confusion with multiple social workers.   CSW stated she was hoping to place the patient in a SNF for rehab following hospital discharge. However, the CSW has not received communication from the patients payor indicating if the claim would be approved. BSW to notify hospital liaison of patients admission as well as Denyse Amass, [facility] Pharmacist who is still actively involved in the patients case.  Daneen Schick, BSW, CDP [facility] [phone]

## 2017-09-24 ENCOUNTER — Encounter (HOSPITAL_COMMUNITY): Payer: Self-pay | Admitting: *Deleted

## 2017-09-24 ENCOUNTER — Inpatient Hospital Stay (HOSPITAL_COMMUNITY): Payer: PPO

## 2017-09-24 ENCOUNTER — Other Ambulatory Visit: Payer: Self-pay | Admitting: Pharmacy Technician

## 2017-09-24 ENCOUNTER — Ambulatory Visit: Payer: Self-pay | Admitting: Pharmacy Technician

## 2017-09-24 LAB — CBC
HCT: 43.3 % (ref 39.0–52.0)
HEMOGLOBIN: 13.9 g/dL (ref 13.0–17.0)
MCH: 28.1 pg (ref 26.0–34.0)
MCHC: 32.1 g/dL (ref 30.0–36.0)
MCV: 87.7 fL (ref 78.0–100.0)
PLATELETS: 334 10*3/uL (ref 150–400)
RBC: 4.94 MIL/uL (ref 4.22–5.81)
RDW: 16.4 % — AB (ref 11.5–15.5)
WBC: 15.4 10*3/uL — ABNORMAL HIGH (ref 4.0–10.5)

## 2017-09-24 LAB — BASIC METABOLIC PANEL
ANION GAP: 13 (ref 5–15)
BUN: 32 mg/dL — ABNORMAL HIGH (ref 8–23)
CALCIUM: 8.6 mg/dL — AB (ref 8.9–10.3)
CO2: 15 mmol/L — ABNORMAL LOW (ref 22–32)
CREATININE: 1.68 mg/dL — AB (ref 0.61–1.24)
Chloride: 109 mmol/L (ref 98–111)
GFR, EST AFRICAN AMERICAN: 43 mL/min — AB (ref >60)
GFR, EST NON AFRICAN AMERICAN: 37 mL/min — AB (ref >60)
Glucose, Bld: 154 mg/dL — ABNORMAL HIGH (ref 70–99)
Potassium: 4.8 mmol/L (ref 3.5–5.1)
SODIUM: 137 mmol/L (ref 135–145)

## 2017-09-24 LAB — CULTURE, BLOOD (ROUTINE X 2)
Culture: NO GROWTH
Culture: NO GROWTH
Special Requests: ADEQUATE
Special Requests: ADEQUATE

## 2017-09-24 LAB — GLUCOSE, CAPILLARY
Glucose-Capillary: 136 mg/dL — ABNORMAL HIGH (ref 70–99)
Glucose-Capillary: 157 mg/dL — ABNORMAL HIGH (ref 70–99)
Glucose-Capillary: 149 mg/dL — ABNORMAL HIGH (ref 70–99)

## 2017-09-24 MED ORDER — CHLORHEXIDINE GLUCONATE 0.12 % MT SOLN
15.0000 mL | Freq: Two times a day (BID) | OROMUCOSAL | Status: DC
  Administered 2017-09-24 – 2017-10-02 (×16): 15 mL via OROMUCOSAL
  Filled 2017-09-24 (×17): qty 15

## 2017-09-24 MED ORDER — SODIUM CHLORIDE 0.45 % IV SOLN: INTRAVENOUS | Status: DC

## 2017-09-24 MED ORDER — ORAL CARE MOUTH RINSE
15.0000 mL | Freq: Two times a day (BID) | OROMUCOSAL | Status: DC
  Administered 2017-09-24 – 2017-10-02 (×18): 15 mL via OROMUCOSAL

## 2017-09-24 MED ORDER — METOPROLOL TARTRATE 5 MG/5ML IV SOLN
2.5000 mg | Freq: Four times a day (QID) | INTRAVENOUS | Status: DC
  Administered 2017-09-24 – 2017-10-01 (×26): 2.5 mg via INTRAVENOUS
  Filled 2017-09-24 (×26): qty 5

## 2017-09-24 MED ORDER — IOPAMIDOL (ISOVUE-M 300) INJECTION 61%
15.0000 mL | Freq: Once | INTRAMUSCULAR | Status: AC | PRN
  Administered 2017-09-24: 50 mL via INTRATHECAL

## 2017-09-24 MED ORDER — DEXTROSE 5 % IV SOLN
INTRAVENOUS | Status: DC
  Administered 2017-09-24 – 2017-09-26 (×5): via INTRAVENOUS
  Filled 2017-09-24 (×8): qty 100

## 2017-09-24 NOTE — Progress Notes (Signed)
Patient had several runs of PVC, this am patient resting comfortable on his left side no signs of distress, vitals taken and patient  Strip saver per CCMD, will continue to monitor.

## 2017-09-24 NOTE — Progress Notes (Signed)
PT Cancellation Note  Patient Details Name: [name] MRN: [id number] DOB: [birth year]   Cancelled Treatment:    Reason Eval/Treat Not Completed: Fatigue/lethargy limiting ability to participate(wife declining therapy today, pt also planning for CT scan)   Duncan Dull 09/24/2017, 2:14 PM Alben Deeds, PT DPT  Board Certified Neurologic Specialist [phone]

## 2017-09-24 NOTE — Care Management Important Message (Signed)
Important Message  Patient Details  Name: [name] MRN: [id number] Date of Birth: [birth year]   Medicare Important Message Given:  Yes    Orbie Pyo 09/24/2017, 8:33 AM

## 2017-09-24 NOTE — Progress Notes (Signed)
Pt had no output, condom cath intact. Foley catheter placed using sterile technique. Immediate return of yellow to amber urine, somewhat cloudy. Balloon inflated to 10cc, catheter secured to right leg with statlock device. Pt and wife educated about catheter indications.

## 2017-09-24 NOTE — Patient Outreach (Signed)
[facility]) Care Management  09/24/2017  CALYB MCQUARRIE January 02, 1935 [id number]   Unsuccessful outreach attempt #1 in regards to patient assistance applications, HIPAA compliant voicemail left.  Will make 2nd outreach attempt in 2-3 business days.  Maud Deed Vado, [employer] [phone]

## 2017-09-24 NOTE — Progress Notes (Signed)
Patient ID: [name], male   DOB: June 26, 1934, 83 y.o.   MRN: [id number]    Assessment:  Gross hematuria: Now resolved.  I note several bladder scans due to low urine output and distended abdomen.  His abdomen is obviously distended not from the bladder but rather from his ileus.  There is likely some third spacing.  It appears his low urine output is likely prerenal and renal in origin.  Plan:  1.  He will follow-up with me as an outpatient for cystoscopy. 2.  I will sign off.  Please call with further urologic concerns.   Subjective: Patient awake and alert.  Unable to obtain any meaningful history.  Objective: Vital signs in last 24 hours: Temp:  [97.6 F (36.4 C)-98.4 F (36.9 C)] 98.2 F (36.8 C) (07/02 0645) Pulse Rate:  [75-110] 87 (07/02 0645) Resp:  [18] 18 (07/02 0645) BP: (138-207)/(64-110) 138/64 (07/02 0645) SpO2:  [95 %-100 %] 99 % (07/02 0430) Weight:  [78.1 kg (172 lb 2.9 oz)] 78.1 kg (172 lb 2.9 oz) (07/02 0430)A  Intake/Output from previous day: 07/01 0701 - 07/02 0700 In: 1974.5 [I.V.:1974.5] Out: 1750 [Urine:1150; Emesis/NG output:600] Intake/Output this shift: No intake/output data recorded.  Past Medical History:  Diagnosis Date  . Alzheimer's dementia 2013  . Anemia    HX OF ANEMIA  . CHF (congestive heart failure) ([place])   . Colon cancer ([place])   . COPD (chronic obstructive pulmonary disease) ([place])   . Depression   . Dyspnea   . Hyperlipidemia   . Hypertension   . Pneumonia 2014  . PVD (peripheral vascular disease) ([place])   . Type II diabetes mellitus ([place])     Physical Exam:  General: Awake, alert and in no apparent distress. NG tube in place. Lungs: Normal respiratory effort, chest expands symmetrically.  Abdomen: Soft, Distended. GU: Condom catheter in place.  Urine clear.  Lab Results: Recent Labs    09/22/17 0415 09/23/17 0556 09/24/17 0421  WBC 16.1* 17.4* 15.4*  HGB 11.5* 12.6* 13.9  HCT 37.0* 39.1 43.3   BMET Recent  Labs    09/23/17 0556 09/24/17 0421  NA 135 137  K 4.4 4.8  CL 107 109  CO2 18* 15*  GLUCOSE 187* 154*  BUN 29* 32*  CREATININE 1.75* 1.68*  CALCIUM 8.7* 8.6*   No results for input(s): LABURIN in the last 72 hours. Results for orders placed or performed during the hospital encounter of 09/19/17  Culture, blood (routine x 2)     Status: None (Preliminary result)   Collection Time: 09/19/17 12:13 PM  Result Value Ref Range Status   Specimen Description BLOOD RIGHT ANTECUBITAL  Final   Special Requests   Final    BOTTLES DRAWN AEROBIC AND ANAEROBIC Blood Culture adequate volume   Culture   Final    NO GROWTH 4 DAYS Performed at [facility], [place] [address], [place], [place] [address]    Report Status PENDING  Incomplete  Culture, blood (routine x 2)     Status: None (Preliminary result)   Collection Time: 09/19/17  1:30 PM  Result Value Ref Range Status   Specimen Description BLOOD LEFT ANTECUBITAL  Final   Special Requests   Final    BOTTLES DRAWN AEROBIC AND ANAEROBIC Blood Culture adequate volume   Culture   Final    NO GROWTH 4 DAYS Performed at [facility], [place] [address], [place], [place] [address]    Report Status PENDING  Incomplete  Urine culture     Status: None   Collection Time: 09/19/17  2:56 PM  Result Value Ref Range Status   Specimen Description URINE, CLEAN CATCH  Final   Special Requests NONE  Final   Culture   Final    NO GROWTH Performed at [facility], [address] [place], [place] [address]    Report Status 09/20/2017 FINAL  Final    Studies/Results: Dg Abd Portable 1v  Result Date: 09/24/2017 CLINICAL DATA:  NG tube placement EXAM: PORTABLE ABDOMEN - 1 VIEW COMPARISON:  09/23/2017, CT 09/19/2017 FINDINGS: Lung bases are clear. Esophageal tube tip overlies the proximal stomach. Persistent gaseous enlargement of small bowel up to 3.2 cm. IMPRESSION: 1. Esophageal tube tip overlies the proximal stomach 2. Persistent gaseous  enlargement of small bowel, suspect for bowel obstruction Electronically Signed   By: Donavan Foil M.D.   On: 09/24/2017 01:46      Katilin Raynes C 09/24/2017, 7:09 AM

## 2017-09-24 NOTE — Progress Notes (Signed)
Patient abdomen more distended and uncomfortable , Notified Baltazar Najjar NP and orders given for NGT,placed x ray order to confirm placement and will connect to LWIS once x ray is confirmed. I called daughter to update  Family and got a voice mail. Patient tolerated 95F Nasogastric tube to right nare. Also NT suction patient and patient have improved upper aairway bath sounds

## 2017-09-24 NOTE — Progress Notes (Signed)
I and O cath performed on patient x2 during 7p-7a shift

## 2017-09-24 NOTE — Progress Notes (Addendum)
[facility] PROGRESS NOTE  [name]:[id number] DOB: Feb 10, 1935 DOA: 09/19/2017  PCP: Glendale Chard, MD  Brief History/Interval Summary: 83 year old African-American male with a past medical history of dementia, peripheral vascular disease status post right to left femoral-femoral graft, right iliac stents, chronic systolic CHF with EF of 20 to 25%, COPD, type 2 diabetes, history of colon cancer status post resection with hematuria.  Patient was seen by urology and was hospitalized.  He was started on continuous bladder irrigation.  Hematuria resolved.  Foley was removed.  Subsequently he developed ileus.  Reason for Visit: Hematuria.  Ileus  Consultants: Urology  Procedures: Insertion of Foley catheter.  Foley catheter removed on 6/29.  Had to be reinserted on 7/2.  Antibiotics: Ceftriaxone  Subjective/Interval History: Patient somnolent but easily arousable.  Unable to obtain much information from him.  Overnight events noted.  NG tube was placed overnight with improvement in his distention.  ROS: Unable to do due to his dementia  Objective:  Vital Signs  Vitals:   09/24/17 0046 09/24/17 0430 09/24/17 0430 09/24/17 0645  BP: (!) 142/87  (!) 150/95 138/64  Pulse:   (!) 110 87  Resp:   18 18  Temp:   98 F (36.7 C) 98.2 F (36.8 C)  TempSrc:      SpO2:  99%    Weight:   78.1 kg (172 lb 2.9 oz)   Height:        Intake/Output Summary (Last 24 hours) at 09/24/2017 0935 Last data filed at 09/24/2017 0535 Gross per 24 hour  Intake 1974.47 ml  Output 1750 ml  Net 224.47 ml   Filed Weights   09/19/17 1732 09/24/17 0430  Weight: 72.3 kg (159 lb 6.3 oz) 78.1 kg (172 lb 2.9 oz)    General appearance: In no distress Resp: Upper airway breath sounds noted.  Lungs diminished at the bases.  But no obvious wheezing rales or rhonchi.  Normal effort at rest. Cardio: S1-S2 is normal.  No S3-S4.  Telemetry shows PVCs. GI: Abdomen noted to be less distended today  compared to yesterday.  Sluggish bowel sounds were present.  No masses organomegaly.  Tender in the left lower quadrant.   Extremities: No edema Neurologic: No focal neurological deficits.  He is disoriented.  Lab Results:  Data Reviewed: I have personally reviewed following labs and imaging studies  CBC: Recent Labs  Lab 09/19/17 1038 09/20/17 0555 09/21/17 0421 09/22/17 0415 09/23/17 0556 09/24/17 0421  WBC 8.5 12.7* 20.3* 16.1* 17.4* 15.4*  NEUTROABS 5.4  --   --  13.4*  --   --   HGB 13.6 12.7* 11.8* 11.5* 12.6* 13.9  HCT 43.6 39.3 37.0* 37.0* 39.1 43.3  MCV 90.6 87.5 89.6 90.5 87.9 87.7  PLT 384 407* 371 296 369 287    Basic Metabolic Panel: Recent Labs  Lab 09/20/17 0555 09/21/17 0421 09/22/17 0415 09/23/17 0556 09/24/17 0421  NA 137 140 137 135 137  K 4.2 4.9 4.4 4.4 4.8  CL 108 115* 112* 107 109  CO2 20* 19* 19* 18* 15*  GLUCOSE 162* 145* 112* 187* 154*  BUN 23 26* 22 29* 32*  CREATININE 1.64* 2.09* 1.59* 1.75* 1.68*  CALCIUM 8.6* 8.2* 8.4* 8.7* 8.6*    GFR: Estimated Creatinine Clearance: 38.7 mL/min (A) (by C-G formula based on SCr of 1.68 mg/dL (H)).  Liver Function Tests: Recent Labs  Lab 09/19/17 1038  AST 26  ALT 12  ALKPHOS 64  BILITOT 0.8  PROT 7.4  ALBUMIN 3.7    Coagulation Profile: Recent Labs  Lab 09/19/17 2005  INR 1.01    CBG: Recent Labs  Lab 09/23/17 0746 09/23/17 1217 09/23/17 1720 09/23/17 2116 09/24/17 0833  GLUCAP 177* 200* 172* 164* 136*     Recent Results (from the past 240 hour(s))  Urine culture     Status: None   Collection Time: 09/17/17  4:31 PM  Result Value Ref Range Status   Specimen Description URINE, CLEAN CATCH  Final   Special Requests NONE  Final   Culture   Final    NO GROWTH Performed at [facility], [place] [address] [place], [place] [address]    Report Status 09/18/2017 FINAL  Final  Culture, blood (routine x 2)     Status: None (Preliminary result)   Collection Time: 09/19/17  12:13 PM  Result Value Ref Range Status   Specimen Description BLOOD RIGHT ANTECUBITAL  Final   Special Requests   Final    BOTTLES DRAWN AEROBIC AND ANAEROBIC Blood Culture adequate volume   Culture   Final    NO GROWTH 4 DAYS Performed at [facility], [place] [address], [place], [place] [address]    Report Status PENDING  Incomplete  Culture, blood (routine x 2)     Status: None (Preliminary result)   Collection Time: 09/19/17  1:30 PM  Result Value Ref Range Status   Specimen Description BLOOD LEFT ANTECUBITAL  Final   Special Requests   Final    BOTTLES DRAWN AEROBIC AND ANAEROBIC Blood Culture adequate volume   Culture   Final    NO GROWTH 4 DAYS Performed at [facility], [place] [address] [place], [place] [address]    Report Status PENDING  Incomplete  Urine culture     Status: None   Collection Time: 09/19/17  2:56 PM  Result Value Ref Range Status   Specimen Description URINE, CLEAN CATCH  Final   Special Requests NONE  Final   Culture   Final    NO GROWTH Performed at [facility], [place] [address] [place], [place] [address]    Report Status 09/20/2017 FINAL  Final      Radiology Studies: Dg Abd Acute W/chest  Result Date: 09/23/2017 CLINICAL DATA:  Abdominal distension EXAM: DG ABDOMEN ACUTE W/ 1V CHEST COMPARISON:  09/21/2017, CT 09/19/2017 FINDINGS: Single-view chest demonstrates mild basilar atelectasis. Stable cardiomediastinal silhouette with aortic atherosclerosis. Tiny calcified nodules at the lung bases as before. Supine and upright views of the abdomen demonstrate no free air beneath the diaphragm. Development of multiple loops of dilated air-filled small bowel measuring up to 3.1 cm. Postsurgical changes in the pelvis. Right iliac stent. IMPRESSION: 1. No radiographic evidence for acute cardiopulmonary abnormality. 2. Development of multiple air-filled dilated loops of small bowel with some distal gas present, findings could be secondary to an  ileus, however partial small bowel obstruction could also produce this appearance. Electronically Signed   By: Donavan Foil M.D.   On: 09/23/2017 01:39   Dg Abd Portable 1v  Result Date: 09/24/2017 CLINICAL DATA:  NG tube placement EXAM: PORTABLE ABDOMEN - 1 VIEW COMPARISON:  09/23/2017, CT 09/19/2017 FINDINGS: Lung bases are clear. Esophageal tube tip overlies the proximal stomach. Persistent gaseous enlargement of small bowel up to 3.2 cm. IMPRESSION: 1. Esophageal tube tip overlies the proximal stomach 2. Persistent gaseous enlargement of small bowel, suspect for bowel obstruction Electronically Signed   By: Maudie Mercury  Francoise Ceo M.D.   On: 09/24/2017 01:46     Medications:  Scheduled: . chlorhexidine  15 mL Mouth Rinse BID  . digoxin  0.125 mg Oral Daily  . docusate sodium  100 mg Oral BID  . famotidine  20 mg Oral Daily  . fluticasone furoate-vilanterol  1 puff Inhalation Daily  . insulin aspart  0-5 Units Subcutaneous QHS  . insulin aspart  0-9 Units Subcutaneous TID WC  . mouth rinse  15 mL Mouth Rinse q12n4p  . metoprolol tartrate  12.5 mg Oral BID  . polyethylene glycol  17 g Oral BID  . pravastatin  80 mg Oral Daily  . tamsulosin  0.4 mg Oral QPC supper   Continuous: . cefTRIAXone (ROCEPHIN)  IV 1 g (09/23/17 1401)   IBB:[id number] **OR** acetaminophen, hydrALAZINE, ondansetron **OR** ondansetron (ZOFRAN) IV, RESOURCE THICKENUP CLEAR, sodium phosphate  Assessment/Plan:   Ileus versus small bowel obstruction/constipation Patient with worsening abdominal distention.  NG tube had to be placed last night.  Patient was given laxatives and stool softeners and enema yesterday without much stool output.  Patient is noted to be tender in the left lower quadrant.  Proceed with CT scan of the abdomen and pelvis.  CT scan done during the early part of this admission did not show any obstruction.  Gross hematuria Appears to have resolved.  Etiology unclear.  Differential diagnosis includes  UTI, neoplasm, hematuria in the setting of Plavix.  Urology was consulted.  Foley catheter was placed.  Patient underwent continuous bladder irrigation.  Once urine started clearing up his Foley catheter was discontinued on 6/29.  Urine remains clear.  Patient has had multiple episodes of urinary retention over the last 24 hours requiring multiple in and out catheterizations.  Again noted to have retention this morning.  Foley catheter has been inserted.  We will leave it in for now.  Patient can be followed for this as an outpatient by urology.  He will need to have a cystoscopy as an outpatient for his hematuria.  Urine cultures have been negative.  Leukocytosis improving.  Continue ceftriaxone for now.    Acute kidney injury Patient's baseline creatinine around 1.1.  Patient's creatinine was 1.48 at admission.  Creatinine climbed up to 2.09.  Patient was given gentle IV hydration.  Renal function did improve.  Patient with multiple episodes of urinary retention.  Foley catheter placed this morning.  Creatinine better today compared to yesterday.  BUN noted to be higher.  His bicarbonate noted to be 15 this morning.  Increased intra-abdominal pressure also contributing.  Hopefully will start improving.  Could put him on low-dose bicarbonate infusion.  Respiratory status remains stable.  Watch for volume overload.   Leukocytosis Reason for his leukocytosis not entirely clear.  He remains on ceftriaxone.  He remains afebrile.  Cultures have all been negative.  Improved this morning.  Continue to monitor.  History of peripheral vascular disease Patient has had multiple interventions including bypass as well as stent placements.  Apparently further interventions are limited by patient's dementia.  Plavix was held due to hematuria.  Resume when he is able to take orally.  History of chronic systolic CHF EF known to be about 20 to 25% per echo about 4 years ago.  He has been requiring IV fluids due to poor  oral intake.  Now with ileus versus obstruction.  Continue to watch volume status closely.  Holding his diuretics.  Dig level was nontoxic.  Digoxin was continued and his renal  function was improving.  Stop digoxin due to ileus.   Accelerated hypertension Elevated blood pressure likely secondary to pain.  Blood pressure has improved with placement of NG tube.  Continue to monitor.  Hydralazine as needed.  Type 2 diabetes Holding his home medications.  CBGs are stable.  Continue SSI.  History of COPD Stable.  History of dementia Appears to be at baseline with perhaps increased agitation during nighttime.  This is likely due to a new environment acute infection and acute illness etc.. Patient's mental status has improved.  He appears to be close to baseline.  ADDENDUM CT scan raises concern for perforated urinary bladder.  Findings discussed with Dr. Karsten Ro with urology.  He is going to put orders in for a CT cystogram.  Recommends keeping the Foley in for now.  Patient's wife was notified of these findings.  CODE STATUS discussed.  She will discuss with her children and get back to [place].  Patient's wife and children have decided to change patient to DNR status based on his previously known wishes.  Bladder rupture confirmed by CT pelvis/cystogram.  Management per urology.  Leave Foley in.  Continue to ceftriaxone for now.  Discussed with Dr. Megan Salon with ID.  No need to broaden coverage currently.  However if patient starts exhibiting signs of sepsis then could broaden to cefepime.  DVT Prophylaxis: SCDs    Code Status: Full code Family Communication: Discussed with the patient's wife Disposition Plan: Patient has had a setback in terms of ileus and constipation.  CT scan ordered for today.    LOS: 5 days   [facility] Pager [phone] 09/24/2017, 9:35 AM  If 7PM-7AM, please contact night-coverage at [email or web address], password [facility]

## 2017-09-24 NOTE — Progress Notes (Signed)
SLP Cancellation Note  Patient Details Name: WILBERT HAYASHI MRN: [id number] DOB: [birth year]   Cancelled treatment:       Reason Eval/Treat Not Completed: Medical issues which prohibited therapy. Pt remains NPO, now with NGT for suction. Will continue to follow when he can resume POs.   [name] 09/24/2017, 8:03 AM  [name], M.A. CCC-SLP [phone]

## 2017-09-25 DIAGNOSIS — L8990 Pressure ulcer of unspecified site, unspecified stage: Secondary | ICD-10-CM

## 2017-09-25 DIAGNOSIS — R0989 Other specified symptoms and signs involving the circulatory and respiratory systems: Secondary | ICD-10-CM

## 2017-09-25 LAB — CBC
HEMATOCRIT: 38.2 % — AB (ref 39.0–52.0)
Hemoglobin: 12.4 g/dL — ABNORMAL LOW (ref 13.0–17.0)
MCH: 28.2 pg (ref 26.0–34.0)
MCHC: 32.5 g/dL (ref 30.0–36.0)
MCV: 87 fL (ref 78.0–100.0)
PLATELETS: 343 10*3/uL (ref 150–400)
RBC: 4.39 MIL/uL (ref 4.22–5.81)
RDW: 15.9 % — AB (ref 11.5–15.5)
WBC: 7.7 10*3/uL (ref 4.0–10.5)

## 2017-09-25 LAB — BASIC METABOLIC PANEL
Anion gap: 7 (ref 5–15)
BUN: 15 mg/dL (ref 8–23)
CALCIUM: 8.1 mg/dL — AB (ref 8.9–10.3)
CO2: 25 mmol/L (ref 22–32)
CREATININE: 0.71 mg/dL (ref 0.61–1.24)
Chloride: 106 mmol/L (ref 98–111)
GFR calc Af Amer: >60 mL/min (ref >60)
GFR calc non Af Amer: >60 mL/min (ref >60)
GLUCOSE: 174 mg/dL — AB (ref 70–99)
Potassium: 3.6 mmol/L (ref 3.5–5.1)
Sodium: 138 mmol/L (ref 135–145)

## 2017-09-25 LAB — GLUCOSE, CAPILLARY
Glucose-Capillary: 160 mg/dL — ABNORMAL HIGH (ref 70–99)
Glucose-Capillary: 144 mg/dL — ABNORMAL HIGH (ref 70–99)
Glucose-Capillary: 122 mg/dL — ABNORMAL HIGH (ref 70–99)

## 2017-09-25 LAB — MAGNESIUM: Magnesium: 2.1 mg/dL (ref 1.7–2.4)

## 2017-09-25 LAB — DIGOXIN LEVEL: Digoxin Level: 0.3 ng/mL — ABNORMAL LOW (ref 0.8–2.0)

## 2017-09-25 MED ORDER — HYDRALAZINE HCL 20 MG/ML IJ SOLN
10.0000 mg | Freq: Three times a day (TID) | INTRAMUSCULAR | Status: DC
  Administered 2017-09-25 – 2017-09-27 (×6): 10 mg via INTRAVENOUS
  Filled 2017-09-25 (×7): qty 1

## 2017-09-25 MED ORDER — SODIUM CHLORIDE 0.9 % IV SOLN: 1.0000 g | INTRAVENOUS | Status: DC

## 2017-09-25 MED ORDER — LABETALOL HCL 5 MG/ML IV SOLN: 10.0000 mg | INTRAVENOUS | Status: DC | PRN

## 2017-09-25 MED ORDER — POTASSIUM CHLORIDE 10 MEQ/100ML IV SOLN
10.0000 meq | INTRAVENOUS | Status: AC
  Administered 2017-09-25 (×2): 10 meq via INTRAVENOUS
  Filled 2017-09-25 (×2): qty 100

## 2017-09-25 NOTE — Progress Notes (Signed)
CSW spoke with [employer] and withdrew SNF pre-authorization request due to patient medical workup. Per [employer], patient will likely be denied unless his cognition improves. CSW will re-submit authorization when patient more medically stable.   [name] Rhiannon Sassaman LCSW [phone]

## 2017-09-25 NOTE — Progress Notes (Signed)
[facility]                                                                              Patient Demographics  [name], is a 83 y.o. male, DOB - 1934-05-10, CHE:[id number]  Admit date - 09/19/2017   Admitting Physician Cristy Folks, MD  Outpatient Primary MD for the patient is Glendale Chard, MD  Outpatient specialists:   LOS - 6  days   Medical records reviewed and are as summarized below:    Chief Complaint  Patient presents with  . Hematuria       Brief summary   83 year old African-American male with a past medical history of dementia, peripheral vascular disease status post right to left femoral-femoral graft, right iliac stents, chronic systolic CHF with EF of 20 to 25%, COPD, type 2 diabetes, history of colon cancer status post resection with hematuria.  Patient was seen by urology and was hospitalized.  He was started on continuous bladder irrigation.  Hematuria resolved.  Foley was removed.  Subsequently he developed ileus.  Assessment & Plan    Principal Problem:  Abdominal distention secondary to ileus -Patient was noted to have worsening abdominal distention on 7/1, NG tube was placed -CT abdomen showed worsening abdominal ileus with multiple air-fluid levels findings compatible with ileus -Continue NGT suction  Active problems Gross hematuria in the setting of Plavix, perforated urinary bladder -Initially appeared to have resolved, urology was consulted.  Patient underwent CBI. -Foley catheter was discontinued on 6/29, subsequently had multiple episodes of urinary retention on 7/1, Foley catheter was reinserted. -Given patient continued to have abdominal pain, CT abdomen and pelvis was obtained which showed perforated urinary bladder -CT cystogram confirmed the presence of extraperitoneal bladder rupture with extravasation of contrast into the perivesical fat the prostate gland  -Urology was consulted again, seen by Dr. Velvet Bathe this  morning, recommended continue Foley catheter, remain indwelling for 2 weeks and continue antibiotics while on catheter awaiting the healing of bladder rupture.  Patient will need cystoscopy and cystogram outpatient.  Acute kidney injury with metabolic acidosis -Baseline creatinine around 1.1, 1.48 at the time of admission, plateaued at 2.09 -Patient received gentle IV fluid hydration, Foley catheter replaced -Creatinine improved from 1.6 to 0.7 today -Bicarb improving, follow closely  Leukocytosis -Likely due to #1 and #2, continue IV Rocephin, improving -Cultures negative so far  Acute encephalopathy on dementia -Close to his baseline may have delirium and sundowning -Per wife at the bedside, difficult to care for at home, requesting skilled nursing facility at disposition.  History of PVD -Plavix currently held due to hematuria.  Currently n.p.o. status, will restart once able to take p.o.   Chronic systolic CHF -EF 20 to 53% per 2D echo in 08/2013 -Monitor volume status closely, Currently diuretics are on hold, on IV fluids  - digoxin on hold due to due to ileus  Accelerated hypertension -Placed on IV hydralazine 10 mg every 8 hours and labetalol as needed with parameters   Diabetes mellitus type 2, NIDDM -Continue sliding scale insulin   COPD - Currently stable  Goals of care -Discussed with patient's wife at the  bedside, currently DNR status however prognosis guarded, difficulty taking care of him at home, worsening dementia.  Palliative care consult placed   Code Status: DNR DVT Prophylaxis:   SCD's Family Communication: Discussed in detail with the patient, all imaging results, lab results explained to the patient's wife at the bedside   Disposition Plan: Once able to take p.o.  Time Spent in minutes   37minutes  Procedures:  None  Consultants:   Urology  Antimicrobials:      Medications  Scheduled Meds: . chlorhexidine  15 mL Mouth Rinse BID    . fluticasone furoate-vilanterol  1 puff Inhalation Daily  . insulin aspart  0-5 Units Subcutaneous QHS  . insulin aspart  0-9 Units Subcutaneous TID WC  . mouth rinse  15 mL Mouth Rinse q12n4p  . metoprolol tartrate  2.5 mg Intravenous Q6H   Continuous Infusions: . cefTRIAXone (ROCEPHIN)  IV 1 g (09/24/17 1417)  . potassium chloride    .  sodium bicarbonate  infusion 1000 mL 75 mL/hr at 09/25/17 0538   PRN Meds:.acetaminophen **OR** acetaminophen, hydrALAZINE, ondansetron **OR** ondansetron (ZOFRAN) IV, RESOURCE THICKENUP CLEAR   Antibiotics   Anti-infectives (From admission, onward)   Start     Dose/Rate Route Frequency Ordered Stop   09/20/17 1200  cefTRIAXone (ROCEPHIN) 1 g in sodium chloride 0.9 % 100 mL IVPB     1 g 200 mL/hr over 30 Minutes Intravenous Every 24 hours 09/19/17 2009 09/25/17 2359   09/19/17 1530  cefTRIAXone (ROCEPHIN) 1 g in sodium chloride 0.9 % 100 mL IVPB     1 g 200 mL/hr over 30 Minutes Intravenous  Once 09/19/17 1518 09/19/17 1557   09/19/17 1300  cefTRIAXone (ROCEPHIN) 1 g in sodium chloride 0.9 % 100 mL IVPB  Status:  Discontinued     1 g 200 mL/hr over 30 Minutes Intravenous  Once 09/19/17 1258 09/19/17 1259        Subjective:   [name] was seen and examined today.  Lethargic, has dementia, difficult to obtain review of system from the patient.  On NGT suction  Objective:   Vitals:   09/25/17 0627 09/25/17 0628 09/25/17 1106 09/25/17 1311  BP: (!) 181/107 (!) 169/93 (!) 152/73 (!) 199/91  Pulse: 85 93 80 92  Resp:   18 (!) 24  Temp:   (!) 97.3 F (36.3 C) 98.3 F (36.8 C)  TempSrc:   Axillary Oral  SpO2:   100% 95%  Weight:      Height:        Intake/Output Summary (Last 24 hours) at 09/25/2017 1315 Last data filed at 09/25/2017 0547 Gross per 24 hour  Intake 1181.31 ml  Output 1625 ml  Net -443.69 ml     Wt Readings from Last 3 Encounters:  09/25/17 78.4 kg (172 lb 13.5 oz)  09/17/17 69.9 kg (154 lb)  08/05/17 70.3 kg  (155 lb)     Exam  General: NAD, NGT suction  Eyes: PERRLA, EOMI, Anicteric Sclera,  HEENT:  Atraumatic, normocephalic  Cardiovascular: S1 S2 auscultated,  Regular rate and rhythm.  Respiratory: Bilateral scattered rhonchi  Gastrointestinal: Soft, hypoactive bowel sounds   Ext: no pedal edema bilaterally  Neuro: unable to assess, confused not following commands  Musculoskeletal: No digital cyanosis, clubbing  Skin: No rashes  Psych: lethargic   Data Reviewed:  I have personally reviewed following labs and imaging studies  Micro Results Recent Results (from the past 240 hour(s))  Urine culture  Status: None   Collection Time: 09/17/17  4:31 PM  Result Value Ref Range Status   Specimen Description URINE, CLEAN CATCH  Final   Special Requests NONE  Final   Culture   Final    NO GROWTH Performed at [facility], [address] [place], [place] [address]    Report Status 09/18/2017 FINAL  Final  Culture, blood (routine x 2)     Status: None   Collection Time: 09/19/17 12:13 PM  Result Value Ref Range Status   Specimen Description BLOOD RIGHT ANTECUBITAL  Final   Special Requests   Final    BOTTLES DRAWN AEROBIC AND ANAEROBIC Blood Culture adequate volume   Culture   Final    NO GROWTH 5 DAYS Performed at [facility], [place] [address] [place], [place] [address]    Report Status 09/24/2017 FINAL  Final  Culture, blood (routine x 2)     Status: None   Collection Time: 09/19/17  1:30 PM  Result Value Ref Range Status   Specimen Description BLOOD LEFT ANTECUBITAL  Final   Special Requests   Final    BOTTLES DRAWN AEROBIC AND ANAEROBIC Blood Culture adequate volume   Culture   Final    NO GROWTH 5 DAYS Performed at [facility], [place] [address] [place], [place] [address]    Report Status 09/24/2017 FINAL  Final  Urine culture     Status: None   Collection Time: 09/19/17  2:56 PM  Result Value Ref Range Status   Specimen Description URINE,  CLEAN CATCH  Final   Special Requests NONE  Final   Culture   Final    NO GROWTH Performed at [facility], [place] [address] [place], [place] [address]    Report Status 09/20/2017 FINAL  Final    Radiology Reports Ct Abdomen Pelvis Wo Contrast  Result Date: 09/24/2017 CLINICAL DATA:  83 year old male with history of potential bowel obstruction. EXAM: CT ABDOMEN AND PELVIS WITHOUT CONTRAST TECHNIQUE: Multidetector CT imaging of the abdomen and pelvis was performed following the standard protocol without IV contrast. COMPARISON:  CT of the abdomen and pelvis 09/19/2017. FINDINGS: Lower chest: Emphysematous changes. Small right pleural effusion lying dependently. Atherosclerotic calcifications in the descending thoracic aorta as well as the left anterior descending and right coronary arteries. Nasogastric tube in the esophagus. Small hiatal hernia. Hepatobiliary: No definite cystic or solid hepatic lesions are confidently identified on today's noncontrast CT examination. Unenhanced appearance of the gallbladder is unremarkable. Pancreas: No definite pancreatic mass or peripancreatic fluid collections or inflammatory changes are noted on today's noncontrast CT examination. Spleen: Unremarkable. Adrenals/Urinary Tract: Unenhanced appearance of the adrenal glands and right kidney is unremarkable. 2 cm exophytic low-attenuation lesion extending off the medial aspect of the lower pole of the left kidney, incompletely characterized on today's noncontrast CT examination, but similar to prior studies, likely a tiny cyst. No hydroureteronephrosis. Urinary bladder is nearly decompressed around an indwelling Foley balloon catheter. There appears to be combination of intramural gas in the wall of the urinary bladder, as well as some extraluminal gas adjacent to the urinary bladder both anteriorly (in the space of Retzius) and superiorly. This is best appreciated on axial images 62-70 and sagittal images 54-66.  Inflammatory changes are also noted throughout the adjacent soft tissues where there is also small volume of intermediate attenuation fluid, which appears to be predominantly located in the space of Retzius. Stomach/Bowel: Nasogastric tube extends into the  stomach. There multiple prominent borderline dilated loops of small bowel with multiple air-fluid levels. No pathologic dilatation of colon. Gas and stool is noted throughout the colon extending to the level of the rectum. Suture line in the rectum indicative of prior partial proctocolectomy. Normal appendix. Vascular/Lymphatic: Aortic atherosclerosis. Fem-fem bypass graft incompletely evaluated on today's noncontrast CT examination. No lymphadenopathy noted in the abdomen or pelvis. Reproductive: Prostate gland and seminal vesicles are unremarkable in appearance. Other: Small volume of ascites. Trace volume of pneumoperitoneum adjacent to the urinary bladder, as discussed above. Musculoskeletal: Posterior orthopedic fixation hardware in place at L4-L5. Interbody graft at L4-L5. 13 mm of anterolisthesis of L4 upon L5. There are no aggressive appearing lytic or blastic lesions noted in the visualized portions of the skeleton. IMPRESSION: 1. Findings are highly concerning for perforated urinary bladder. This includes both intramural gas, extraluminal gas adjacent to the urinary bladder, and gas and fluid in the space of Retzius. 2. Small volume of ascites or urine noted in the peritoneal cavity. 3. Multiple prominent borderline dilated loops of small bowel with multiple air-fluid levels. Findings are not compatible with obstruction, but may reflect a mild ileus. 4. Aortic atherosclerosis, in addition to at least 2 vessel coronary artery disease. 5. Small right pleural effusion lying dependently. 6. Emphysema. 7. Additional incidental findings, as above. These results will be called to the ordering clinician or representative by the Radiologist Assistant, and  communication documented in the PACS or zVision Dashboard. Aortic Atherosclerosis (ICD10-I70.0) and Emphysema (ICD10-J43.9). Electronically Signed   By: Vinnie Langton M.D.   On: 09/24/2017 12:50   Ct Pelvis Wo Contrast  Result Date: 09/24/2017 CLINICAL DATA:  Suspected perforation of the urinary bladder on earlier noncontrast CT. Recent gross hematuria. History of diabetes, peripheral vascular disease and dementia. EXAM: CT PELVIS WITHOUT CONTRAST TECHNIQUE: Multidetector CT imaging of the pelvis was performed following the installation of 50 cc Omnipaque 300 diluted in 450 cc of saline through the indwelling Foley catheter. No intravenous contrast was administered. COMPARISON:  Noncontrast CT 09/19/2017 and 09/24/2017. FINDINGS: Urinary Tract: There is moderate anterior extravasation of contrast from the bladder into the prevesical fat. Contrast extends along the anterior abdominal wall, asymmetric to the left, admixed with extraluminal air. Contrast also extends into the prostate gland, asymmetric to the right. No contrast extension into the peritoneal cavity is seen. A Foley catheter is in place. There is mild bladder wall thickening and trabeculation. The distal ureters do not appear dilated. Bowel: Multiple borderline dilated loops of small bowel with air-fluid levels are again noted, most consistent with an ileus. There is a rectal anastomosis. The visualized colon and appendix otherwise appear normal. Vascular/Lymphatic: No enlarged pelvic lymph nodes identified. Extensive aortic and branch vessel atherosclerosis. Postsurgical changes in both groins from previous femoral-femoral bypass graft. Reproductive: Extravasated bladder contrast extends into the substance of the prostate gland, asymmetric to the right. The seminal vesicles appear normal. Other: No free air or peritoneal contrast. Musculoskeletal: No evidence of pelvic fracture. Postsurgical changes in the lower lumbar spine and sacroiliac  degenerative changes are noted. IMPRESSION: 1. CT cystogram confirms the presence of extraperitoneal bladder rupture with extravasation of contrast into the prevesical fat and prostate gland. No peritoneal contrast demonstrated. 2. No evidence of underlying pelvic fracture. 3. Probable small bowel ileus. 4.  Aortic Atherosclerosis (ICD10-I70.0). Electronically Signed   By: Richardean Sale M.D.   On: 09/24/2017 16:49   Dg Chest Port 1 View  Result Date: 09/21/2017 CLINICAL DATA:  Dyspnea.  EXAM: PORTABLE CHEST 1 VIEW COMPARISON:  05/04/2017 and prior studies. FINDINGS: Cardiomediastinal silhouette is normal. Mediastinal contours appear intact. Calcific atherosclerotic disease of the aorta. There is no evidence of pleural effusion or pneumothorax. Low lung volumes. Multiple tiny hyperdense nodules overlie bilateral lung bases, right greater than left. Osseous structures are without acute abnormality. Soft tissues are grossly normal. IMPRESSION: Multiple tiny hyperdense nodules overlie bilateral lung bases, right greater than left. These may represent pleural calcifications, pleuroparenchymal scarring or high density nodules along the bronchovascular tree of the lower lobes. Calcific atherosclerotic disease and tortuosity of the aorta. Electronically Signed   By: Fidela Salisbury M.D.   On: 09/21/2017 11:16   Dg Abd Acute W/chest  Result Date: 09/23/2017 CLINICAL DATA:  Abdominal distension EXAM: DG ABDOMEN ACUTE W/ 1V CHEST COMPARISON:  09/21/2017, CT 09/19/2017 FINDINGS: Single-view chest demonstrates mild basilar atelectasis. Stable cardiomediastinal silhouette with aortic atherosclerosis. Tiny calcified nodules at the lung bases as before. Supine and upright views of the abdomen demonstrate no free air beneath the diaphragm. Development of multiple loops of dilated air-filled small bowel measuring up to 3.1 cm. Postsurgical changes in the pelvis. Right iliac stent. IMPRESSION: 1. No radiographic evidence  for acute cardiopulmonary abnormality. 2. Development of multiple air-filled dilated loops of small bowel with some distal gas present, findings could be secondary to an ileus, however partial small bowel obstruction could also produce this appearance. Electronically Signed   By: Donavan Foil M.D.   On: 09/23/2017 01:39   Dg Abd Portable 1v  Result Date: 09/24/2017 CLINICAL DATA:  NG tube placement EXAM: PORTABLE ABDOMEN - 1 VIEW COMPARISON:  09/23/2017, CT 09/19/2017 FINDINGS: Lung bases are clear. Esophageal tube tip overlies the proximal stomach. Persistent gaseous enlargement of small bowel up to 3.2 cm. IMPRESSION: 1. Esophageal tube tip overlies the proximal stomach 2. Persistent gaseous enlargement of small bowel, suspect for bowel obstruction Electronically Signed   By: Donavan Foil M.D.   On: 09/24/2017 01:46   Ct Renal Stone Study  Result Date: 09/19/2017 CLINICAL DATA:  Gross hematuria.  Urinary retention. EXAM: CT ABDOMEN AND PELVIS WITHOUT CONTRAST TECHNIQUE: Multidetector CT imaging of the abdomen and pelvis was performed following the standard protocol without IV contrast. COMPARISON:  None. FINDINGS: Lower chest: No acute abnormality. Hepatobiliary: No focal liver abnormality is seen. No gallstones, gallbladder wall thickening, or biliary dilatation. Pancreas: Unremarkable. No pancreatic ductal dilatation or surrounding inflammatory changes. Spleen: Normal in size without focal abnormality. Adrenals/Urinary Tract: Adrenal glands appear normal. Kidneys appear normal. No hydronephrosis or renal obstruction is noted. Mild diffuse wall thickening of urinary bladder is noted with small diverticulum seen superiorly. Blood is noted in the dependent portion of the urinary bladder lumen. Stomach/Bowel: Stomach is within normal limits. Appendix appears normal. No evidence of bowel wall thickening, distention, or inflammatory changes. Vascular/Lymphatic: Aortic atherosclerosis. No enlarged abdominal or  pelvic lymph nodes. Reproductive: Prostate is unremarkable. Other: No abdominal wall hernia or abnormality. No abdominopelvic ascites. Musculoskeletal: Postsurgical and degenerative changes are noted in the lower lumbar spine. No acute abnormality is noted. IMPRESSION: No renal or ureteral calculi are noted.  No hydronephrosis is noted. Mild diffuse urinary bladder wall thickening is noted concerning for cystitis. Small bladder diverticulum is noted superiorly. Probable hemorrhage is noted within the urinary bladder lumen; cystoscopy is recommended to rule out possible neoplasm or malignancy. Aortic Atherosclerosis (ICD10-I70.0). Electronically Signed   By: Marijo Conception, M.D.   On: 09/19/2017 15:08    Lab Data:  CBC:  Recent Labs  Lab 09/19/17 1038  09/21/17 0421 09/22/17 0415 09/23/17 0556 09/24/17 0421 09/25/17 0421  WBC 8.5   < > 20.3* 16.1* 17.4* 15.4* 7.7  NEUTROABS 5.4  --   --  13.4*  --   --   --   HGB 13.6   < > 11.8* 11.5* 12.6* 13.9 12.4*  HCT 43.6   < > 37.0* 37.0* 39.1 43.3 38.2*  MCV 90.6   < > 89.6 90.5 87.9 87.7 87.0  PLT 384   < > 371 296 369 334 343   < > = values in this interval not displayed.   Basic Metabolic Panel: Recent Labs  Lab 09/21/17 0421 09/22/17 0415 09/23/17 0556 09/24/17 0421 09/25/17 0421  NA 140 137 135 137 138  K 4.9 4.4 4.4 4.8 3.6  CL 115* 112* 107 109 106  CO2 19* 19* 18* 15* 25  GLUCOSE 145* 112* 187* 154* 174*  BUN 26* 22 29* 32* 15  CREATININE 2.09* 1.59* 1.75* 1.68* 0.71  CALCIUM 8.2* 8.4* 8.7* 8.6* 8.1*   GFR: Estimated Creatinine Clearance: 81.7 mL/min (by C-G formula based on SCr of 0.71 mg/dL). Liver Function Tests: Recent Labs  Lab 09/19/17 1038  AST 26  ALT 12  ALKPHOS 64  BILITOT 0.8  PROT 7.4  ALBUMIN 3.7   No results for input(s): LIPASE, AMYLASE in the last 168 hours. No results for input(s): AMMONIA in the last 168 hours. Coagulation Profile: Recent Labs  Lab 09/19/17 2005  INR 1.01   Cardiac  Enzymes: No results for input(s): CKTOTAL, CKMB, CKMBINDEX, TROPONINI in the last 168 hours. BNP (last 3 results) No results for input(s): PROBNP in the last 8760 hours. HbA1C: No results for input(s): HGBA1C in the last 72 hours. CBG: Recent Labs  Lab 09/24/17 0833 09/24/17 1229 09/24/17 2031 09/25/17 0811 09/25/17 1303  GLUCAP 136* 157* 149* 160* 144*   Lipid Profile: No results for input(s): CHOL, HDL, LDLCALC, TRIG, CHOLHDL, LDLDIRECT in the last 72 hours. Thyroid Function Tests: No results for input(s): TSH, T4TOTAL, FREET4, T3FREE, THYROIDAB in the last 72 hours. Anemia Panel: No results for input(s): VITAMINB12, FOLATE, FERRITIN, TIBC, IRON, RETICCTPCT in the last 72 hours. Urine analysis:    Component Value Date/Time   COLORURINE RED (A) 09/19/2017 1456   APPEARANCEUR HAZY (A) 09/19/2017 1456   LABSPEC 1.009 09/19/2017 1456   PHURINE 6.0 09/19/2017 1456   GLUCOSEU NEGATIVE 09/19/2017 1456   HGBUR LARGE (A) 09/19/2017 1456   BILIRUBINUR NEGATIVE 09/19/2017 1456   BILIRUBINUR negative 05/23/2017 0855   KETONESUR NEGATIVE 09/19/2017 1456   PROTEINUR 100 (A) 09/19/2017 1456   UROBILINOGEN 0.2 05/23/2017 0855   UROBILINOGEN 1.0 12/10/2013 1456   NITRITE NEGATIVE 09/19/2017 1456   LEUKOCYTESUR MODERATE (A) 09/19/2017 1456     Markiah Janeway M.D. [facility] 09/25/2017, 1:15 PM  Pager: [phone] Between 7am to 7pm - call Pager - [phone]  After 7pm go to [email or web address] - password [email or web address]  Call night coverage person covering after 7pm

## 2017-09-25 NOTE — Progress Notes (Signed)
SLP Cancellation Note  Patient Details Name: [name] MRN: [id number] DOB: June 17, 1934   Cancelled treatment:       Reason Eval/Treat Not Completed: Medical issues which prohibited therapy (ileus, NGT with low intermittent suction).    [name] 09/25/2017, 8:21 AM  [name], M.A. CCC-SLP [phone]

## 2017-09-25 NOTE — Progress Notes (Signed)
Patient had 6 beat run vtach. Patient exhibited no S/Ss and no c/o pain. Continue to monitor.

## 2017-09-25 NOTE — Progress Notes (Signed)
MD paged about patient having 15 beats of Vtach

## 2017-09-25 NOTE — Progress Notes (Signed)
PT Cancellation Note  Patient Details Name: [name] MRN: [id number] DOB: Oct 05, 1934   Cancelled Treatment:    Reason Eval/Treat Not Completed: Medical issues which prohibited therapy(Chart reviewed. Pt has elevated BP, increased NGT drainage, new cardica arrythmia, and decrased responsiveness. RN reports patient not appropriate at this time for PT. )  3:12 PM, 09/25/17 Etta Grandchild, PT, DPT Physical Therapist - [place] [phone] (Pager)  [phone] (Office)      Buccola,Allan C 09/25/2017, 3:12 PM

## 2017-09-25 NOTE — Progress Notes (Signed)
Patient ID: [name], male   DOB: 06/08/1934, 83 y.o.   MRN: [id number]    Assessment:   Extraperitoneal bladder rupture:   He was found to have what appeared to be an extraperitoneal bladder rupture by non contrasted CT scan and a CT cystogram was performed which confirmed an extraperitoneal bladder rupture near the dome.  Review of his previous CT scans reveals that there was a diverticulum in this location which clearly is a weak portion of the bladder although rupture of these diverticuli is quite rare.  The exact inciting event is unknown however because it is extraperitoneal with a white blood cell count that is trending downward no sign of infection and improving creatinine with Foley catheter drainage the treatment is nonsurgical with adequate drainage be a Foley catheter for at least 2 weeks.  He will need to be maintained on antibiotics during that time.  I have discussed this with his wife in detail today.    Gross hematuria:  This occurred prior to the bladder rupture and has now completely cleared.  For the last 3 days his urine has remained completely clear with no clots.  There were no obvious bladder tumors identified on his CT cystogram however this does not eliminate the need for cystoscopy in the future.   Plan:  1.  Continue Foley catheter upon discharge.  It will remain indwelling for 2 weeks. 2.  Would recommend continued antibiotic therapy while catheter indwelling and awaiting healing his bladder rupture.   3. Outpatient follow-up will include a cystogram to ensure the bladder has healed and cystoscopy.   Subjective: Patient is awake.  He appears to be in no distress.  Wife present in the will room with him this morning.  Objective: Vital signs in last 24 hours: Temp:  [97.5 F (36.4 C)-98.9 F (37.2 C)] 98 F (36.7 C) (07/03 0503) Pulse Rate:  [53-105] 93 (07/03 0628) Resp:  [18-20] 20 (07/03 0503) BP: (146-190)/(86-114) 169/93 (07/03 0628) SpO2:  [95 %-100 %]  95 % (07/03 0503) Weight:  [78.4 kg (172 lb 13.5 oz)] 78.4 kg (172 lb 13.5 oz) (07/03 0506)A  Intake/Output from previous day: 07/02 0701 - 07/03 0700 In: 1672.9 [I.V.:1572.9; IV Piggyback:100] Out: 2000 [Urine:1700; Emesis/NG output:300] Intake/Output this shift: No intake/output data recorded.  Past Medical History:  Diagnosis Date  . Alzheimer's dementia 2013  . Anemia    HX OF ANEMIA  . CHF (congestive heart failure) ([place])   . Colon cancer ([place])   . COPD (chronic obstructive pulmonary disease) ([place])   . Depression   . Dyspnea   . Hyperlipidemia   . Hypertension   . Pneumonia 2014  . PVD (peripheral vascular disease) ([place])   . Type II diabetes mellitus ([facility])     Physical Exam:  General: Awake, alert and in no apparent distress Lungs: Normal respiratory effort, chest expands symmetrically.  Abdomen: Soft,   With less abdominal distention.  He does not have any crepitus or fluctuance in the suprapubic region and seems less tender in this area when compared to yesterday's exam.   GU:  Foley catheter indwelling draining completely clear urine.  Lab Results: Recent Labs    09/23/17 0556 09/24/17 0421 09/25/17 0421  WBC 17.4* 15.4* 7.7  HGB 12.6* 13.9 12.4*  HCT 39.1 43.3 38.2*   BMET Recent Labs    09/24/17 0421 09/25/17 0421  NA 137 138  K 4.8 3.6  CL 109 106  CO2 15* 25  GLUCOSE 154* 174*  BUN 32* 15  CREATININE 1.68* 0.71  CALCIUM 8.6* 8.1*   No results for input(s): LABURIN in the last 72 hours. Results for orders placed or performed during the hospital encounter of 09/19/17  Culture, blood (routine x 2)     Status: None   Collection Time: 09/19/17 12:13 PM  Result Value Ref Range Status   Specimen Description BLOOD RIGHT ANTECUBITAL  Final   Special Requests   Final    BOTTLES DRAWN AEROBIC AND ANAEROBIC Blood Culture adequate volume   Culture   Final    NO GROWTH 5 DAYS Performed at [facility], [place] [address] [place], [place] [address]     Report Status 09/24/2017 FINAL  Final  Culture, blood (routine x 2)     Status: None   Collection Time: 09/19/17  1:30 PM  Result Value Ref Range Status   Specimen Description BLOOD LEFT ANTECUBITAL  Final   Special Requests   Final    BOTTLES DRAWN AEROBIC AND ANAEROBIC Blood Culture adequate volume   Culture   Final    NO GROWTH 5 DAYS Performed at [facility], [place] [address] [place], [place] [address]    Report Status 09/24/2017 FINAL  Final  Urine culture     Status: None   Collection Time: 09/19/17  2:56 PM  Result Value Ref Range Status   Specimen Description URINE, CLEAN CATCH  Final   Special Requests NONE  Final   Culture   Final    NO GROWTH Performed at [facility], [place] [address], [place], [place] [address]    Report Status 09/20/2017 FINAL  Final    Studies/Results: Ct Abdomen Pelvis Wo Contrast  Result Date: 09/24/2017 CLINICAL DATA:  83 year old male with history of potential bowel obstruction. EXAM: CT ABDOMEN AND PELVIS WITHOUT CONTRAST TECHNIQUE: Multidetector CT imaging of the abdomen and pelvis was performed following the standard protocol without IV contrast. COMPARISON:  CT of the abdomen and pelvis 09/19/2017. FINDINGS: Lower chest: Emphysematous changes. Small right pleural effusion lying dependently. Atherosclerotic calcifications in the descending thoracic aorta as well as the left anterior descending and right coronary arteries. Nasogastric tube in the esophagus. Small hiatal hernia. Hepatobiliary: No definite cystic or solid hepatic lesions are confidently identified on today's noncontrast CT examination. Unenhanced appearance of the gallbladder is unremarkable. Pancreas: No definite pancreatic mass or peripancreatic fluid collections or inflammatory changes are noted on today's noncontrast CT examination. Spleen: Unremarkable. Adrenals/Urinary Tract: Unenhanced appearance of the adrenal glands and right kidney is unremarkable. 2 cm exophytic  low-attenuation lesion extending off the medial aspect of the lower pole of the left kidney, incompletely characterized on today's noncontrast CT examination, but similar to prior studies, likely a tiny cyst. No hydroureteronephrosis. Urinary bladder is nearly decompressed around an indwelling Foley balloon catheter. There appears to be combination of intramural gas in the wall of the urinary bladder, as well as some extraluminal gas adjacent to the urinary bladder both anteriorly (in the space of Retzius) and superiorly. This is best appreciated on axial images 62-70 and sagittal images 54-66. Inflammatory changes are also noted throughout the adjacent soft tissues where there is also small volume of intermediate attenuation fluid, which appears to be predominantly located in the space of Retzius. Stomach/Bowel: Nasogastric tube extends into the stomach. There multiple prominent borderline dilated loops of small bowel with multiple air-fluid levels. No pathologic dilatation of colon. Gas and stool is noted throughout the colon extending to the level  of the rectum. Suture line in the rectum indicative of prior partial proctocolectomy. Normal appendix. Vascular/Lymphatic: Aortic atherosclerosis. Fem-fem bypass graft incompletely evaluated on today's noncontrast CT examination. No lymphadenopathy noted in the abdomen or pelvis. Reproductive: Prostate gland and seminal vesicles are unremarkable in appearance. Other: Small volume of ascites. Trace volume of pneumoperitoneum adjacent to the urinary bladder, as discussed above. Musculoskeletal: Posterior orthopedic fixation hardware in place at L4-L5. Interbody graft at L4-L5. 13 mm of anterolisthesis of L4 upon L5. There are no aggressive appearing lytic or blastic lesions noted in the visualized portions of the skeleton. IMPRESSION: 1. Findings are highly concerning for perforated urinary bladder. This includes both intramural gas, extraluminal gas adjacent to the  urinary bladder, and gas and fluid in the space of Retzius. 2. Small volume of ascites or urine noted in the peritoneal cavity. 3. Multiple prominent borderline dilated loops of small bowel with multiple air-fluid levels. Findings are not compatible with obstruction, but may reflect a mild ileus. 4. Aortic atherosclerosis, in addition to at least 2 vessel coronary artery disease. 5. Small right pleural effusion lying dependently. 6. Emphysema. 7. Additional incidental findings, as above. These results will be called to the ordering clinician or representative by the Radiologist Assistant, and communication documented in the PACS or zVision Dashboard. Aortic Atherosclerosis (ICD10-I70.0) and Emphysema (ICD10-J43.9). Electronically Signed   By: Vinnie Langton M.D.   On: 09/24/2017 12:50   Ct Pelvis Wo Contrast  Result Date: 09/24/2017 CLINICAL DATA:  Suspected perforation of the urinary bladder on earlier noncontrast CT. Recent gross hematuria. History of diabetes, peripheral vascular disease and dementia. EXAM: CT PELVIS WITHOUT CONTRAST TECHNIQUE: Multidetector CT imaging of the pelvis was performed following the installation of 50 cc Omnipaque 300 diluted in 450 cc of saline through the indwelling Foley catheter. No intravenous contrast was administered. COMPARISON:  Noncontrast CT 09/19/2017 and 09/24/2017. FINDINGS: Urinary Tract: There is moderate anterior extravasation of contrast from the bladder into the prevesical fat. Contrast extends along the anterior abdominal wall, asymmetric to the left, admixed with extraluminal air. Contrast also extends into the prostate gland, asymmetric to the right. No contrast extension into the peritoneal cavity is seen. A Foley catheter is in place. There is mild bladder wall thickening and trabeculation. The distal ureters do not appear dilated. Bowel: Multiple borderline dilated loops of small bowel with air-fluid levels are again noted, most consistent with an ileus.  There is a rectal anastomosis. The visualized colon and appendix otherwise appear normal. Vascular/Lymphatic: No enlarged pelvic lymph nodes identified. Extensive aortic and branch vessel atherosclerosis. Postsurgical changes in both groins from previous femoral-femoral bypass graft. Reproductive: Extravasated bladder contrast extends into the substance of the prostate gland, asymmetric to the right. The seminal vesicles appear normal. Other: No free air or peritoneal contrast. Musculoskeletal: No evidence of pelvic fracture. Postsurgical changes in the lower lumbar spine and sacroiliac degenerative changes are noted. IMPRESSION: 1. CT cystogram confirms the presence of extraperitoneal bladder rupture with extravasation of contrast into the prevesical fat and prostate gland. No peritoneal contrast demonstrated. 2. No evidence of underlying pelvic fracture. 3. Probable small bowel ileus. 4.  Aortic Atherosclerosis (ICD10-I70.0). Electronically Signed   By: Richardean Sale M.D.   On: 09/24/2017 16:49      Latavius Capizzi C 09/25/2017, 7:04 AM

## 2017-09-25 NOTE — Progress Notes (Signed)
I flushed the patient's NG tube twice, patient's NG tube should be flushed at least once per shift.

## 2017-09-26 ENCOUNTER — Inpatient Hospital Stay (HOSPITAL_COMMUNITY): Payer: PPO

## 2017-09-26 DIAGNOSIS — Z515 Encounter for palliative care: Secondary | ICD-10-CM

## 2017-09-26 DIAGNOSIS — K560 Paralytic ileus: Secondary | ICD-10-CM

## 2017-09-26 DIAGNOSIS — F0280 Dementia in other diseases classified elsewhere without behavioral disturbance: Secondary | ICD-10-CM

## 2017-09-26 LAB — BASIC METABOLIC PANEL
Anion gap: 9 (ref 5–15)
BUN: 12 mg/dL (ref 8–23)
CALCIUM: 8.1 mg/dL — AB (ref 8.9–10.3)
CHLORIDE: 98 mmol/L (ref 98–111)
CO2: 32 mmol/L (ref 22–32)
CREATININE: 0.69 mg/dL (ref 0.61–1.24)
GFR calc non Af Amer: >60 mL/min (ref >60)
Glucose, Bld: 156 mg/dL — ABNORMAL HIGH (ref 70–99)
Potassium: 3.7 mmol/L (ref 3.5–5.1)
Sodium: 139 mmol/L (ref 135–145)

## 2017-09-26 LAB — GLUCOSE, CAPILLARY
Glucose-Capillary: 127 mg/dL — ABNORMAL HIGH (ref 70–99)
Glucose-Capillary: 146 mg/dL — ABNORMAL HIGH (ref 70–99)
Glucose-Capillary: 138 mg/dL — ABNORMAL HIGH (ref 70–99)
Glucose-Capillary: 130 mg/dL — ABNORMAL HIGH (ref 70–99)
Glucose-Capillary: 149 mg/dL — ABNORMAL HIGH (ref 70–99)

## 2017-09-26 MED ORDER — POTASSIUM CHLORIDE 10 MEQ/100ML IV SOLN
10.0000 meq | INTRAVENOUS | Status: AC
  Administered 2017-09-26 (×2): 10 meq via INTRAVENOUS
  Filled 2017-09-26 (×2): qty 100

## 2017-09-26 MED ORDER — METOCLOPRAMIDE HCL 5 MG/ML IJ SOLN: 10.0000 mg | Freq: Four times a day (QID) | INTRAMUSCULAR | Status: DC

## 2017-09-26 MED ORDER — METOCLOPRAMIDE HCL 5 MG/ML IJ SOLN
5.0000 mg | Freq: Four times a day (QID) | INTRAMUSCULAR | Status: DC
  Administered 2017-09-26 – 2017-09-27 (×5): 5 mg via INTRAVENOUS
  Filled 2017-09-26 (×4): qty 2

## 2017-09-26 MED ORDER — SODIUM CHLORIDE 0.9 % IV SOLN
1.0000 g | INTRAVENOUS | Status: DC
  Administered 2017-09-26 – 2017-10-02 (×7): 1 g via INTRAVENOUS
  Filled 2017-09-26 (×7): qty 10

## 2017-09-26 MED ORDER — GLYCERIN (LAXATIVE) 2.1 G RE SUPP
1.0000 | Freq: Once | RECTAL | Status: AC
  Administered 2017-09-26: 1 via RECTAL
  Filled 2017-09-26: qty 1

## 2017-09-26 NOTE — Evaluation (Signed)
SLP Cancellation Note  Patient Details Name: [name] MRN: [id number] DOB: [birth year]   Cancelled treatment:       Reason Eval/Treat Not Completed: Medical issues which prohibited therapy(pt has NG = )   Macario Golds 09/26/2017, 8:06 AM Luanna Salk, [place] [facility] SLP [phone]

## 2017-09-26 NOTE — Consult Note (Signed)
Consultation Note Date: 09/26/2017   Patient Name: [name]  DOB: [birth year]  MRN: [id number]  Age / Sex: 83 y.o., male  PCP: Glendale Chard, MD Referring Physician: Mendel Corning, MD  Reason for Consultation: Establishing goals of care, Non pain symptom management and Psychosocial/spiritual support  HPI/Patient Profile: 83 y.o. male  with past medical history of Alzheimer's dementia, CHF with EF of 20%, DM2, PVD s/p stenting and graft, colon cancer s/p resection who was admitted on 09/19/2017 with hematuria.  He was found to have a ruptured diverticulum in his bladder.  He also had acute kidney injury.  Shortly after admission he developed abdominal pain and was found to have an ileus.  An n/g has been placed for decompression that is putting out thick black/green liquid.  Thus far his ileus has not resolved and he has not been able to take in any nutrition since admission on 7/1.   Per urology the perforated bladder will heal on it's own.  A foley will remain in place for 2 weeks to keep the bladder decompressed and allow it to heal.  Clinical Assessment and Goals of Care:  I have reviewed medical records including EPIC notes, labs and imaging, received report from the care team, assessed the patient and then met at the bedside along with his wife, 2 brothers (and daughter [name] on the phone)  to discuss diagnosis prognosis, GOC, EOL wishes, disposition and options.  I introduced Palliative Medicine as specialized medical care for people living with serious illness. It focuses on providing relief from the symptoms and stress of a serious illness. The goal is to improve quality of life for both the patient and the family.  We discussed a brief life review of the patient. He was a life Charity fundraiser.  He has been married to [name] for 50+ years.  They are [facility] [employer] [employer].  [name] says with a capital B). They have two  sons ([place] and [place]) and 1 daughter Shauna Hugh in [place]).  12 years ago after his colon cancer surgery his Alzheimer's became evident.  [name] quit work and has stayed at home caring for him ever since.  He has excellent quality of life at home.  [name] however is the sole care giver and is becoming exhausted.  [name] was recently in 5 nights of Respite care because [facility] needed a break "My body started to break down".   As far as functional and nutritional status at home, Arsal eats well - he is always pleasant and in good spirits.  He never complains.  Recently he has begun to stumble and fall frequently.  He is continent of bowel but has tremendous difficulties with urination.  [name] reports sometimes he is up 17x a night to urinate.  Prior to this hospitalization he came in to the ER with acute urinary retention and had a foley placed.  I explained that the ileus is our primary problem right now.  If it does not resolve and he is unable to eat - we could  be in an "end of life" situation and we may recommend hospice house.  The family understood.  I attempted to elicit values and goals of care important to the patient and [facility].  [name] feels that [name] has a good quality of life at this point and she wants everything that can be done for him to be done.  I reconfirmed the DNR - [name] agreed he is DNR.  I asked what is an acceptable quality of life, and encouraged [name] to consider at what point in the future she would want to stop aggressive medical interventions and focus only on [name]'s comfort.  [name] needed to take some time to consider that question.  I provided her with a "Hard Choices" book.  [name] expressed her sincere thanks for the excellent medical care [name] is receiving.  She stated with strength "we put our faith in God, he is in control".   Family is coming in from out of town to [place] and to see [place] as they are concerned.   Questions and concerns were addressed.  The family was encouraged to  call with questions or concerns.     Primary Decision Maker:  NEXT OF KIN wife, [name].    SUMMARY OF RECOMMENDATIONS   1. Palliative will follow with you.  Touch base with [name] to keep her updated. 2.  Consider GI or Surgery consultation for ileus as this has the potential to be an end of life situation. 3.  Discuss with attending MD whether or not to consider TPN.  It would be very difficult to discontinue if his ileus does not resolve.  Code Status/Advance Care Planning:  DNR   Symptom Management:   Potassium IV to a serum potassium of 4 - 5  Mobilize if possible (out of bed to chair)  Will try reglan for hiccups and potential prokinetic effects.  (this is not bowel obstruction)  Glycerin suppository    Psycho-social/Spiritual:   Desire for further Chaplaincy support: yes  Prognosis:   If he is unable to take PO nutrition due to ileus for a prolonged period I'm concerned we may get to a Hospice House situation  Discharge Planning: To Be Determined      Primary Diagnoses: Present on Admission: . PVD (peripheral vascular disease) ([place]) . Hyperlipidemia . Essential hypertension . COPD COPD II  . Chronic systolic CHF (congestive heart failure) ([place]) . Dementia . Gross hematuria   I have reviewed the medical record, interviewed the patient and family, and examined the patient. The following aspects are pertinent.  Past Medical History:  Diagnosis Date  . Alzheimer's dementia 2013  . Anemia    HX OF ANEMIA  . CHF (congestive heart failure) ([place])   . Colon cancer ([place])   . COPD (chronic obstructive pulmonary disease) ([place])   . Depression   . Dyspnea   . Hyperlipidemia   . Hypertension   . Pneumonia 2014  . PVD (peripheral vascular disease) ([place])   . Type II diabetes mellitus ([place])    Social History   Socioeconomic History  . Marital status: Married    Spouse name: Not on file  . Number of children: Not on file  . Years of education: Not on file    . Highest education level: Not on file  Occupational History  . Not on file  Social Needs  . Financial resource strain: Not on file  . Food insecurity:    Worry: Not on file    Inability: Not on  file  . Transportation needs:    Medical: Not on file    Non-medical: Not on file  Tobacco Use  . Smoking status: Former Smoker    Packs/day: 0.30    Years: 40.00    Pack years: 12.00    Types: Cigarettes    Last attempt to quit: 03/26/1993    Years since quitting: 24.5  . Smokeless tobacco: Never Used  Substance and Sexual Activity  . Alcohol use: No  . Drug use: No  . Sexual activity: Never  Lifestyle  . Physical activity:    Days per week: Not on file    Minutes per session: Not on file  . Stress: Not on file  Relationships  . Social connections:    Talks on phone: Not on file    Gets together: Not on file    Attends religious service: Not on file    Active member of club or organization: Not on file    Attends meetings of clubs or organizations: Not on file    Relationship status: Not on file  Other Topics Concern  . Not on file  Social History Narrative  . Not on file   Family History  Problem Relation Age of Onset  . Stroke Mother 27  . Hypertension Mother   . Cancer Sister   . Heart disease Sister   . Heart disease Brother        Heart Disease before age 49  . Heart attack Brother   . Cancer Sister   . Cancer Brother    Scheduled Meds: . chlorhexidine  15 mL Mouth Rinse BID  . fluticasone furoate-vilanterol  1 puff Inhalation Daily  . hydrALAZINE  10 mg Intravenous Q8H  . insulin aspart  0-5 Units Subcutaneous QHS  . insulin aspart  0-9 Units Subcutaneous TID WC  . mouth rinse  15 mL Mouth Rinse q12n4p  . metoCLOPramide (REGLAN) injection  10 mg Intravenous Q6H  . metoprolol tartrate  2.5 mg Intravenous Q6H   Continuous Infusions: . potassium chloride    .  sodium bicarbonate  infusion 1000 mL 75 mL/hr at 09/26/17 0954   PRN Meds:.acetaminophen **OR**  acetaminophen, labetalol, ondansetron **OR** ondansetron (ZOFRAN) IV, RESOURCE THICKENUP CLEAR No Known Allergies Review of Systems  Unable to give  Physical Exam  Well developed male who is very pleasantly demented he gives me a big welcoming smile and says yes to all of my questions. N/G in place with black liquid coming out. CV rrr resp no distress Abdomen soft, distended, NT  Vital Signs: BP (!) 180/89   Pulse 69   Temp 98.8 F (37.1 C) (Oral)   Resp 20   Ht '6\' 1"'$  (1.854 m)   Wt 78.4 kg (172 lb 13.5 oz)   SpO2 95%   BMI 22.80 kg/m  Pain Scale: 0-10   Pain Score: Asleep   SpO2: SpO2: 95 % O2 Device:SpO2: 95 % O2 Flow Rate: .   IO: Intake/output summary:   Intake/Output Summary (Last 24 hours) at 09/26/2017 1339 Last data filed at 09/26/2017 9323 Gross per 24 hour  Intake 1072.5 ml  Output 2150 ml  Net -1077.5 ml    LBM: Last BM Date: 09/26/17 Baseline Weight: Weight: 72.3 kg (159 lb 6.3 oz) Most recent weight: Weight: 78.4 kg (172 lb 13.5 oz)     Palliative Assessment/Data: 40% (but unable to take PO)     Time In: 1:00 pm Time Out: 2:10 pm Time Total: 70 min. Greater than  50%  of this time was spent counseling and coordinating care related to the above assessment and plan.  Signed by: Florentina Jenny, PA-C Palliative Medicine Pager: [phone]  Please contact Palliative Medicine Team phone at [phone] for questions and concerns.  For individual provider: See Shea Evans

## 2017-09-26 NOTE — Progress Notes (Signed)
Patient ID: [name], male   DOB: 1934-05-22, 83 y.o.   MRN: [id number]    Assessment:   Extraperitoneal bladder rupture:   He was found to have what appeared to be an extraperitoneal bladder rupture by non contrasted CT scan and a CT cystogram was performed which confirmed an extraperitoneal bladder rupture near the dome.  Review of his previous CT scans reveals that there was a diverticulum in this location which clearly is a weak portion of the bladder although rupture of these diverticuli is quite rare.  The exact inciting event is unknown however because it is extraperitoneal with a white blood cell count that is trending downward no sign of infection and improving creatinine with Foley catheter drainage the treatment is nonsurgical with adequate drainage be a Foley catheter for at least 2 weeks.  He will need to be maintained on antibiotics during that time.  I have discussed this with his wife in detail today.    Gross hematuria:  This has cleared, CBI is off.  There were no obvious bladder tumors identified on his CT cystogram however this does not eliminate the need for cystoscopy in the future.   Plan:  1.  Continue Foley catheter upon discharge.  It will remain indwelling for 2 weeks. 2.  Would recommend continued antibiotic therapy while catheter indwelling and awaiting healing his bladder rupture.   3. Outpatient follow-up will include a cystogram to ensure the bladder has healed and cystoscopy.   Subjective: Patient is awake.  He appears to be in no distress.  Wife present in the will room with him this morning.  Patient more conversant and alert this AM (according to his wife)   Objective: Vital signs in last 24 hours: Temp:  [98.3 F (36.8 C)-98.8 F (37.1 C)] 98.8 F (37.1 C) (07/04 0432) Pulse Rate:  [69-92] 69 (07/04 0432) Resp:  [20-24] 20 (07/04 0432) BP: (161-199)/(89-96) 180/89 (07/04 0603) SpO2:  [95 %] 95 % (07/03 1311)A  Intake/Output from previous  day: 07/03 0701 - 07/04 0700 In: 1072.5 [I.V.:812.5; NG/GT:160; IV Piggyback:100] Out: 9528 [Urine:1150; Emesis/NG output:600] Intake/Output this shift: Total I/O In: -  Out: 400 [Urine:400]  Past Medical History:  Diagnosis Date  . Alzheimer's dementia 2013  . Anemia    HX OF ANEMIA  . CHF (congestive heart failure) ([place])   . Colon cancer ([place])   . COPD (chronic obstructive pulmonary disease) ([place])   . Depression   . Dyspnea   . Hyperlipidemia   . Hypertension   . Pneumonia 2014  . PVD (peripheral vascular disease) ([place])   . Type II diabetes mellitus ([place])     Physical Exam:  General: Awake, alert and in no apparent distress, NG tube in place. Lungs: Normal respiratory effort, chest expands symmetrically. Expiratory wheezing. Abdomen: Soft,   With less abdominal distention.  He does not have any crepitus or fluctuance in the suprapubic region and seems less tender in this area when compared to yesterday's exam.   GU:  Foley catheter indwelling draining completely clear urine.  Lab Results: Recent Labs    09/24/17 0421 09/25/17 0421  WBC 15.4* 7.7  HGB 13.9 12.4*  HCT 43.3 38.2*   BMET Recent Labs    09/24/17 0421 09/25/17 0421  NA 137 138  K 4.8 3.6  CL 109 106  CO2 15* 25  GLUCOSE 154* 174*  BUN 32* 15  CREATININE 1.68* 0.71  CALCIUM 8.6* 8.1*   No results for input(s): LABURIN in  the last 72 hours. Results for orders placed or performed during the hospital encounter of 09/19/17  Culture, blood (routine x 2)     Status: None   Collection Time: 09/19/17 12:13 PM  Result Value Ref Range Status   Specimen Description BLOOD RIGHT ANTECUBITAL  Final   Special Requests   Final    BOTTLES DRAWN AEROBIC AND ANAEROBIC Blood Culture adequate volume   Culture   Final    NO GROWTH 5 DAYS Performed at [facility], [place] [address], [place], [place] [address]    Report Status 09/24/2017 FINAL  Final  Culture, blood (routine x 2)     Status: None    Collection Time: 09/19/17  1:30 PM  Result Value Ref Range Status   Specimen Description BLOOD LEFT ANTECUBITAL  Final   Special Requests   Final    BOTTLES DRAWN AEROBIC AND ANAEROBIC Blood Culture adequate volume   Culture   Final    NO GROWTH 5 DAYS Performed at [facility], [place] [address] [place], [place] [address]    Report Status 09/24/2017 FINAL  Final  Urine culture     Status: None   Collection Time: 09/19/17  2:56 PM  Result Value Ref Range Status   Specimen Description URINE, CLEAN CATCH  Final   Special Requests NONE  Final   Culture   Final    NO GROWTH Performed at [facility], [place] [address] [place], [place] [address]    Report Status 09/20/2017 FINAL  Final    Studies/Results: No results found.    Louis Meckel W 09/26/2017, 11:27 AM

## 2017-09-26 NOTE — Progress Notes (Signed)
[facility]                                                                              Patient Demographics  [name], is a 83 y.o. male, DOB - Oct 12, 1934, JTT:[id number]  Admit date - 09/19/2017   Admitting Physician Edwin Folks, MD  Outpatient Primary MD for the patient is Edwin Chard, MD  Outpatient specialists:   LOS - 7  days   Medical records reviewed and are as summarized below:    Chief Complaint  Patient presents with  . Hematuria       Brief summary   83 year old African-American male with a past medical history of dementia, peripheral vascular disease status post right to left femoral-femoral graft, right iliac stents, chronic systolic CHF with EF of 20 to 25%, COPD, type 2 diabetes, history of colon cancer status post resection with hematuria.  Patient was seen by urology and was hospitalized.  He was started on continuous bladder irrigation.  Hematuria resolved.  Foley was removed.  Subsequently he developed ileus.  Assessment & Plan    Principal Problem:  Abdominal distention secondary to ileus -Patient was noted to have worsening abdominal distention on 7/1, NG tube was placed -CT abdomen showed worsening abdominal ileus with multiple air-fluid levels findings compatible with ileus -Continue NGT suction, although patient is much more alert and awake, still having significant output -Repeat abdominal x-ray portable today  -Poor nutritional status, has been n.p.o. since 09/23/2017.  Palliative consulted for goals of care, if patient's family wants to pursue aggressive care will need PICC line for TPN.  Active problems Gross hematuria in the setting of Plavix, perforated urinary bladder -Initially appeared to have resolved, urology was consulted.  Patient underwent CBI. -Foley catheter was discontinued on 6/29, subsequently had multiple episodes of urinary retention on 7/1, Foley catheter was reinserted. -Given patient continued to  have abdominal pain, CT abdomen and pelvis was obtained which showed perforated urinary bladder -CT cystogram confirmed the presence of extraperitoneal bladder rupture with extravasation of contrast into the perivesical fat the prostate gland  -Urology following, continue Foley catheter, remain indwelling for 2 weeks, continue antibiotics while on catheter awaiting the healing of bladder rupture.  Patient will need cystoscopy and cystogram outpatient. -Continue IV Rocephin, NPO    Acute kidney injury with metabolic acidosis -Baseline creatinine around 1.1, 1.48 at the time of admission, plateaued at 2.09 -Patient received gentle IV fluid hydration, Foley catheter replaced -Continue bicarb drip, recheck BMET  Leukocytosis -Likely due to #1 and #2, continue IV Rocephin, improving -Cultures negative so far  Acute encephalopathy on dementia -Patient today more alert and awake  History of PVD -Plavix currently held due to hematuria.  Currently n.p.o. status, will restart once able to take p.o.  Chronic systolic CHF -EF 20 to 00% per 2D echo in 08/2013 -Monitor volume status closely, Currently diuretics are on hold, on IV fluids  - digoxin on hold due to due to ileus  Accelerated hypertension -Continue IV hydralazine 10 mg every 8 hours and labetalol as needed with parameters   Diabetes mellitus type 2, NIDDM -Continue sliding scale insulin  COPD - Currently stable   Code Status: DNR DVT Prophylaxis:   SCD's Family Communication: I had discussed with patient's wife at the bedside in detail on 7/3 no family member at the bedside currently   Disposition Plan: Once able to take p.o. palliative goals of care pending  Time Spent in minutes   25 minutes  Procedures:  None  Consultants:   Urology Palliative medicine  Antimicrobials:   IV Rocephin   Medications  Scheduled Meds: . chlorhexidine  15 mL Mouth Rinse BID  . fluticasone furoate-vilanterol  1 puff  Inhalation Daily  . Glycerin (Adult)  1 suppository Rectal Once  . hydrALAZINE  10 mg Intravenous Q8H  . insulin aspart  0-5 Units Subcutaneous QHS  . insulin aspart  0-9 Units Subcutaneous TID WC  . mouth rinse  15 mL Mouth Rinse q12n4p  . metoCLOPramide (REGLAN) injection  5 mg Intravenous Q6H  . metoprolol tartrate  2.5 mg Intravenous Q6H   Continuous Infusions: . cefTRIAXone (ROCEPHIN)  IV    . potassium chloride    .  sodium bicarbonate  infusion 1000 mL 75 mL/hr at 09/26/17 0954   PRN Meds:.acetaminophen **OR** acetaminophen, labetalol, ondansetron **OR** ondansetron (ZOFRAN) IV, RESOURCE THICKENUP CLEAR   Antibiotics   Anti-infectives (From admission, onward)   Start     Dose/Rate Route Frequency Ordered Stop   09/26/17 1400  cefTRIAXone (ROCEPHIN) 1 g in sodium chloride 0.9 % 100 mL IVPB     1 g 200 mL/hr over 30 Minutes Intravenous Every 24 hours 09/26/17 1358     09/25/17 1345  cefTRIAXone (ROCEPHIN) 1 g in sodium chloride 0.9 % 100 mL IVPB  Status:  Discontinued     1 g 200 mL/hr over 30 Minutes Intravenous Every 24 hours 09/25/17 1334 09/25/17 1340   09/20/17 1200  cefTRIAXone (ROCEPHIN) 1 g in sodium chloride 0.9 % 100 mL IVPB     1 g 200 mL/hr over 30 Minutes Intravenous Every 24 hours 09/19/17 2009 09/25/17 1405   09/19/17 1530  cefTRIAXone (ROCEPHIN) 1 g in sodium chloride 0.9 % 100 mL IVPB     1 g 200 mL/hr over 30 Minutes Intravenous  Once 09/19/17 1518 09/19/17 1557   09/19/17 1300  cefTRIAXone (ROCEPHIN) 1 g in sodium chloride 0.9 % 100 mL IVPB  Status:  Discontinued     1 g 200 mL/hr over 30 Minutes Intravenous  Once 09/19/17 1258 09/19/17 1259        Subjective:   [name] was seen and examined today.  Much more alert and awake today, no fevers.  Still has significant NG output.  No hematuria.  No fevers or chills.   Objective:   Vitals:   09/25/17 2053 09/26/17 0432 09/26/17 0603 09/26/17 1342  BP: (!) 161/96 (!) 180/89 (!) 180/89 (!)  142/91  Pulse: 80 69  84  Resp: 20 20  (!) 26  Temp: 98.8 F (37.1 C) 98.8 F (37.1 C)  97.8 F (36.6 C)  TempSrc: Oral Oral  Oral  SpO2:    98%  Weight:      Height:        Intake/Output Summary (Last 24 hours) at 09/26/2017 1402 Last data filed at 09/26/2017 0953 Gross per 24 hour  Intake 1072.5 ml  Output 2150 ml  Net -1077.5 ml     Wt Readings from Last 3 Encounters:  09/25/17 78.4 kg (172 lb 13.5 oz)  09/17/17 69.9 kg (154 lb)  08/05/17 70.3 kg (155 lb)  Exam  Physical Exam  General: Alert and awake, NGT+  Eyes:   HEENT:    Cardiovascular: S1 S2 auscultated, Regular rate and rhythm. No pedal edema b/l  Respiratory: Decreased breath sound at the bases  Gastrointestinal: Soft, hypoactive bowel sounds  Ext: no pedal edema bilaterally  Neuro: difficult to assess, does not follow commands as well  Musculoskeletal: No digital cyanosis, clubbing  Skin: No rashes  Psych: much more alert and awake today   Data Reviewed:  I have personally reviewed following labs and imaging studies  Micro Results Recent Results (from the past 240 hour(s))  Urine culture     Status: None   Collection Time: 09/17/17  4:31 PM  Result Value Ref Range Status   Specimen Description URINE, CLEAN CATCH  Final   Special Requests NONE  Final   Culture   Final    NO GROWTH Performed at [facility], [place] [address], [place], [place] [address]    Report Status 09/18/2017 FINAL  Final  Culture, blood (routine x 2)     Status: None   Collection Time: 09/19/17 12:13 PM  Result Value Ref Range Status   Specimen Description BLOOD RIGHT ANTECUBITAL  Final   Special Requests   Final    BOTTLES DRAWN AEROBIC AND ANAEROBIC Blood Culture adequate volume   Culture   Final    NO GROWTH 5 DAYS Performed at [facility], [place] [address], [place], [place] [address]    Report Status 09/24/2017 FINAL  Final  Culture, blood (routine x 2)     Status: None   Collection Time:  09/19/17  1:30 PM  Result Value Ref Range Status   Specimen Description BLOOD LEFT ANTECUBITAL  Final   Special Requests   Final    BOTTLES DRAWN AEROBIC AND ANAEROBIC Blood Culture adequate volume   Culture   Final    NO GROWTH 5 DAYS Performed at [facility], [place] [address] [place], [place] [address]    Report Status 09/24/2017 FINAL  Final  Urine culture     Status: None   Collection Time: 09/19/17  2:56 PM  Result Value Ref Range Status   Specimen Description URINE, CLEAN CATCH  Final   Special Requests NONE  Final   Culture   Final    NO GROWTH Performed at [facility], [place] [address] [place], [place] [address]    Report Status 09/20/2017 FINAL  Final    Radiology Reports Ct Abdomen Pelvis Wo Contrast  Result Date: 09/24/2017 CLINICAL DATA:  83 year old male with history of potential bowel obstruction. EXAM: CT ABDOMEN AND PELVIS WITHOUT CONTRAST TECHNIQUE: Multidetector CT imaging of the abdomen and pelvis was performed following the standard protocol without IV contrast. COMPARISON:  CT of the abdomen and pelvis 09/19/2017. FINDINGS: Lower chest: Emphysematous changes. Small right pleural effusion lying dependently. Atherosclerotic calcifications in the descending thoracic aorta as well as the left anterior descending and right coronary arteries. Nasogastric tube in the esophagus. Small hiatal hernia. Hepatobiliary: No definite cystic or solid hepatic lesions are confidently identified on today's noncontrast CT examination. Unenhanced appearance of the gallbladder is unremarkable. Pancreas: No definite pancreatic mass or peripancreatic fluid collections or inflammatory changes are noted on today's noncontrast CT examination. Spleen: Unremarkable. Adrenals/Urinary Tract: Unenhanced appearance of the adrenal glands and right kidney is unremarkable. 2 cm exophytic low-attenuation lesion extending off the medial aspect of the lower pole of the left kidney, incompletely  characterized on  today's noncontrast CT examination, but similar to prior studies, likely a tiny cyst. No hydroureteronephrosis. Urinary bladder is nearly decompressed around an indwelling Foley balloon catheter. There appears to be combination of intramural gas in the wall of the urinary bladder, as well as some extraluminal gas adjacent to the urinary bladder both anteriorly (in the space of Retzius) and superiorly. This is best appreciated on axial images 62-70 and sagittal images 54-66. Inflammatory changes are also noted throughout the adjacent soft tissues where there is also small volume of intermediate attenuation fluid, which appears to be predominantly located in the space of Retzius. Stomach/Bowel: Nasogastric tube extends into the stomach. There multiple prominent borderline dilated loops of small bowel with multiple air-fluid levels. No pathologic dilatation of colon. Gas and stool is noted throughout the colon extending to the level of the rectum. Suture line in the rectum indicative of prior partial proctocolectomy. Normal appendix. Vascular/Lymphatic: Aortic atherosclerosis. Fem-fem bypass graft incompletely evaluated on today's noncontrast CT examination. No lymphadenopathy noted in the abdomen or pelvis. Reproductive: Prostate gland and seminal vesicles are unremarkable in appearance. Other: Small volume of ascites. Trace volume of pneumoperitoneum adjacent to the urinary bladder, as discussed above. Musculoskeletal: Posterior orthopedic fixation hardware in place at L4-L5. Interbody graft at L4-L5. 13 mm of anterolisthesis of L4 upon L5. There are no aggressive appearing lytic or blastic lesions noted in the visualized portions of the skeleton. IMPRESSION: 1. Findings are highly concerning for perforated urinary bladder. This includes both intramural gas, extraluminal gas adjacent to the urinary bladder, and gas and fluid in the space of Retzius. 2. Small volume of ascites or urine noted in the  peritoneal cavity. 3. Multiple prominent borderline dilated loops of small bowel with multiple air-fluid levels. Findings are not compatible with obstruction, but may reflect a mild ileus. 4. Aortic atherosclerosis, in addition to at least 2 vessel coronary artery disease. 5. Small right pleural effusion lying dependently. 6. Emphysema. 7. Additional incidental findings, as above. These results will be called to the ordering clinician or representative by the Radiologist Assistant, and communication documented in the PACS or zVision Dashboard. Aortic Atherosclerosis (ICD10-I70.0) and Emphysema (ICD10-J43.9). Electronically Signed   By: Vinnie Langton M.D.   On: 09/24/2017 12:50   Ct Pelvis Wo Contrast  Result Date: 09/24/2017 CLINICAL DATA:  Suspected perforation of the urinary bladder on earlier noncontrast CT. Recent gross hematuria. History of diabetes, peripheral vascular disease and dementia. EXAM: CT PELVIS WITHOUT CONTRAST TECHNIQUE: Multidetector CT imaging of the pelvis was performed following the installation of 50 cc Omnipaque 300 diluted in 450 cc of saline through the indwelling Foley catheter. No intravenous contrast was administered. COMPARISON:  Noncontrast CT 09/19/2017 and 09/24/2017. FINDINGS: Urinary Tract: There is moderate anterior extravasation of contrast from the bladder into the prevesical fat. Contrast extends along the anterior abdominal wall, asymmetric to the left, admixed with extraluminal air. Contrast also extends into the prostate gland, asymmetric to the right. No contrast extension into the peritoneal cavity is seen. A Foley catheter is in place. There is mild bladder wall thickening and trabeculation. The distal ureters do not appear dilated. Bowel: Multiple borderline dilated loops of small bowel with air-fluid levels are again noted, most consistent with an ileus. There is a rectal anastomosis. The visualized colon and appendix otherwise appear normal. Vascular/Lymphatic:  No enlarged pelvic lymph nodes identified. Extensive aortic and branch vessel atherosclerosis. Postsurgical changes in both groins from previous femoral-femoral bypass graft. Reproductive: Extravasated bladder contrast extends into the substance of  the prostate gland, asymmetric to the right. The seminal vesicles appear normal. Other: No free air or peritoneal contrast. Musculoskeletal: No evidence of pelvic fracture. Postsurgical changes in the lower lumbar spine and sacroiliac degenerative changes are noted. IMPRESSION: 1. CT cystogram confirms the presence of extraperitoneal bladder rupture with extravasation of contrast into the prevesical fat and prostate gland. No peritoneal contrast demonstrated. 2. No evidence of underlying pelvic fracture. 3. Probable small bowel ileus. 4.  Aortic Atherosclerosis (ICD10-I70.0). Electronically Signed   By: Richardean Sale M.D.   On: 09/24/2017 16:49   Dg Chest Port 1 View  Result Date: 09/21/2017 CLINICAL DATA:  Dyspnea. EXAM: PORTABLE CHEST 1 VIEW COMPARISON:  05/04/2017 and prior studies. FINDINGS: Cardiomediastinal silhouette is normal. Mediastinal contours appear intact. Calcific atherosclerotic disease of the aorta. There is no evidence of pleural effusion or pneumothorax. Low lung volumes. Multiple tiny hyperdense nodules overlie bilateral lung bases, right greater than left. Osseous structures are without acute abnormality. Soft tissues are grossly normal. IMPRESSION: Multiple tiny hyperdense nodules overlie bilateral lung bases, right greater than left. These may represent pleural calcifications, pleuroparenchymal scarring or high density nodules along the bronchovascular tree of the lower lobes. Calcific atherosclerotic disease and tortuosity of the aorta. Electronically Signed   By: Fidela Salisbury M.D.   On: 09/21/2017 11:16   Dg Abd Acute W/chest  Result Date: 09/23/2017 CLINICAL DATA:  Abdominal distension EXAM: DG ABDOMEN ACUTE W/ 1V CHEST  COMPARISON:  09/21/2017, CT 09/19/2017 FINDINGS: Single-view chest demonstrates mild basilar atelectasis. Stable cardiomediastinal silhouette with aortic atherosclerosis. Tiny calcified nodules at the lung bases as before. Supine and upright views of the abdomen demonstrate no free air beneath the diaphragm. Development of multiple loops of dilated air-filled small bowel measuring up to 3.1 cm. Postsurgical changes in the pelvis. Right iliac stent. IMPRESSION: 1. No radiographic evidence for acute cardiopulmonary abnormality. 2. Development of multiple air-filled dilated loops of small bowel with some distal gas present, findings could be secondary to an ileus, however partial small bowel obstruction could also produce this appearance. Electronically Signed   By: Donavan Foil M.D.   On: 09/23/2017 01:39   Dg Abd Portable 1v  Result Date: 09/24/2017 CLINICAL DATA:  NG tube placement EXAM: PORTABLE ABDOMEN - 1 VIEW COMPARISON:  09/23/2017, CT 09/19/2017 FINDINGS: Lung bases are clear. Esophageal tube tip overlies the proximal stomach. Persistent gaseous enlargement of small bowel up to 3.2 cm. IMPRESSION: 1. Esophageal tube tip overlies the proximal stomach 2. Persistent gaseous enlargement of small bowel, suspect for bowel obstruction Electronically Signed   By: Donavan Foil M.D.   On: 09/24/2017 01:46   Ct Renal Stone Study  Result Date: 09/19/2017 CLINICAL DATA:  Gross hematuria.  Urinary retention. EXAM: CT ABDOMEN AND PELVIS WITHOUT CONTRAST TECHNIQUE: Multidetector CT imaging of the abdomen and pelvis was performed following the standard protocol without IV contrast. COMPARISON:  None. FINDINGS: Lower chest: No acute abnormality. Hepatobiliary: No focal liver abnormality is seen. No gallstones, gallbladder wall thickening, or biliary dilatation. Pancreas: Unremarkable. No pancreatic ductal dilatation or surrounding inflammatory changes. Spleen: Normal in size without focal abnormality. Adrenals/Urinary  Tract: Adrenal glands appear normal. Kidneys appear normal. No hydronephrosis or renal obstruction is noted. Mild diffuse wall thickening of urinary bladder is noted with small diverticulum seen superiorly. Blood is noted in the dependent portion of the urinary bladder lumen. Stomach/Bowel: Stomach is within normal limits. Appendix appears normal. No evidence of bowel wall thickening, distention, or inflammatory changes. Vascular/Lymphatic: Aortic atherosclerosis. No enlarged  abdominal or pelvic lymph nodes. Reproductive: Prostate is unremarkable. Other: No abdominal wall hernia or abnormality. No abdominopelvic ascites. Musculoskeletal: Postsurgical and degenerative changes are noted in the lower lumbar spine. No acute abnormality is noted. IMPRESSION: No renal or ureteral calculi are noted.  No hydronephrosis is noted. Mild diffuse urinary bladder wall thickening is noted concerning for cystitis. Small bladder diverticulum is noted superiorly. Probable hemorrhage is noted within the urinary bladder lumen; cystoscopy is recommended to rule out possible neoplasm or malignancy. Aortic Atherosclerosis (ICD10-I70.0). Electronically Signed   By: Marijo Conception, M.D.   On: 09/19/2017 15:08    Lab Data:  CBC: Recent Labs  Lab 09/21/17 0421 09/22/17 0415 09/23/17 0556 09/24/17 0421 09/25/17 0421  WBC 20.3* 16.1* 17.4* 15.4* 7.7  NEUTROABS  --  13.4*  --   --   --   HGB 11.8* 11.5* 12.6* 13.9 12.4*  HCT 37.0* 37.0* 39.1 43.3 38.2*  MCV 89.6 90.5 87.9 87.7 87.0  PLT 371 296 369 334 937   Basic Metabolic Panel: Recent Labs  Lab 09/21/17 0421 09/22/17 0415 09/23/17 0556 09/24/17 0421 09/25/17 0421 09/25/17 1322  NA 140 137 135 137 138  --   K 4.9 4.4 4.4 4.8 3.6  --   CL 115* 112* 107 109 106  --   CO2 19* 19* 18* 15* 25  --   GLUCOSE 145* 112* 187* 154* 174*  --   BUN 26* 22 29* 32* 15  --   CREATININE 2.09* 1.59* 1.75* 1.68* 0.71  --   CALCIUM 8.2* 8.4* 8.7* 8.6* 8.1*  --   MG  --   --    --   --   --  2.1   GFR: Estimated Creatinine Clearance: 81.7 mL/min (by C-G formula based on SCr of 0.71 mg/dL). Liver Function Tests: No results for input(s): AST, ALT, ALKPHOS, BILITOT, PROT, ALBUMIN in the last 168 hours. No results for input(s): LIPASE, AMYLASE in the last 168 hours. No results for input(s): AMMONIA in the last 168 hours. Coagulation Profile: Recent Labs  Lab 09/19/17 2005  INR 1.01   Cardiac Enzymes: No results for input(s): CKTOTAL, CKMB, CKMBINDEX, TROPONINI in the last 168 hours. BNP (last 3 results) No results for input(s): PROBNP in the last 8760 hours. HbA1C: No results for input(s): HGBA1C in the last 72 hours. CBG: Recent Labs  Lab 09/25/17 1303 09/25/17 1630 09/26/17 0143 09/26/17 0811 09/26/17 1207  GLUCAP 144* 122* 127* 146* 138*   Lipid Profile: No results for input(s): CHOL, HDL, LDLCALC, TRIG, CHOLHDL, LDLDIRECT in the last 72 hours. Thyroid Function Tests: No results for input(s): TSH, T4TOTAL, FREET4, T3FREE, THYROIDAB in the last 72 hours. Anemia Panel: No results for input(s): VITAMINB12, FOLATE, FERRITIN, TIBC, IRON, RETICCTPCT in the last 72 hours. Urine analysis:    Component Value Date/Time   COLORURINE RED (A) 09/19/2017 1456   APPEARANCEUR HAZY (A) 09/19/2017 1456   LABSPEC 1.009 09/19/2017 1456   PHURINE 6.0 09/19/2017 1456   GLUCOSEU NEGATIVE 09/19/2017 1456   HGBUR LARGE (A) 09/19/2017 1456   BILIRUBINUR NEGATIVE 09/19/2017 1456   BILIRUBINUR negative 05/23/2017 0855   KETONESUR NEGATIVE 09/19/2017 1456   PROTEINUR 100 (A) 09/19/2017 1456   UROBILINOGEN 0.2 05/23/2017 0855   UROBILINOGEN 1.0 12/10/2013 1456   NITRITE NEGATIVE 09/19/2017 1456   LEUKOCYTESUR MODERATE (A) 09/19/2017 1456     Ripudeep Rai M.D. [facility] 09/26/2017, 2:02 PM  Pager: [phone] Between 7am to 7pm - call Pager - [phone]  After 7pm go to [email or web address] - password [email or web address]  Call night coverage person covering after 7pm

## 2017-09-26 NOTE — Progress Notes (Signed)
Physical Therapy Treatment Patient Details Name: [name] MRN: [id number] DOB: October 16, 1934 Today's Date: 09/26/2017    History of Present Illness Pt is an 83 y.o. male admitted 06/25/17 now s/p LE angiography. PMH includes Alzheimer's dementia, CHF, COPD, HTN, PVD, DM.    PT Comments    Pt limited in mobility today by suctioned NG tube. Pt willing to participated in therapeutic exercise to maintain strength. Pt requires max multimodal cuing with one step commands to perform AROM in UE and AAROM in LE. PT will continue to follow to progress mobility pt is more medically appropriate. D/c plans remain appropriate at this time.   Follow Up Recommendations  SNF;Supervision/Assistance - 24 hour     Equipment Recommendations  Other (comment)(tbd at next level of care)       Precautions / Restrictions Precautions Precautions: Fall Restrictions Weight Bearing Restrictions: No    Mobility  Bed Mobility               General bed mobility comments: unable to attempt to to suctioned NG tube                          Cognition Arousal/Alertness: Awake/alert Behavior During Therapy: WFL for tasks assessed/performed Overall Cognitive Status: History of cognitive impairments - at baseline                                 General Comments: inconsistently follows one step commands with max multimodal cues      Exercises General Exercises - Upper Extremity Shoulder Flexion: AROM;Both;5 reps;Supine Elbow Flexion: Both;5 reps;Supine;AAROM Elbow Extension: Both;5 reps;Supine;AAROM General Exercises - Lower Extremity Ankle Circles/Pumps: AAROM;Both;10 reps;Seated Heel Slides: AAROM;Both;10 reps;Supine Hip ABduction/ADduction: AAROM;Both;10 reps Straight Leg Raises: Both;10 reps;Supine;AAROM    General Comments General comments (skin integrity, edema, etc.): Wife present at end of session. VSS       Pertinent Vitals/Pain Faces Pain Scale: No hurt            PT Goals (current goals can now be found in the care plan section) Acute Rehab PT Goals PT Goal Formulation: Patient unable to participate in goal setting Time For Goal Achievement: 10/04/17 Potential to Achieve Goals: Fair Progress towards PT goals: Not progressing toward goals - comment(limited in mobility secondary to NG tube suction)    Frequency    Min 2X/week      PT Plan Current plan remains appropriate       AM-PAC PT "6 Clicks" Daily Activity  Outcome Measure  Difficulty turning over in bed (including adjusting bedclothes, sheets and blankets)?: A Lot Difficulty moving from lying on back to sitting on the side of the bed? : A Little Difficulty sitting down on and standing up from a chair with arms (e.g., wheelchair, bedside commode, etc,.)?: A Lot Help needed moving to and from a bed to chair (including a wheelchair)?: A Lot Help needed walking in hospital room?: A Lot Help needed climbing 3-5 steps with a railing? : A Lot 6 Click Score: 13    End of Session Equipment Utilized During Treatment: Gait belt Activity Tolerance: Patient limited by fatigue Patient left: in bed;with call bell/phone within reach;with bed alarm set;with family/visitor present Nurse Communication: Mobility status;Need for lift equipment PT Visit Diagnosis: Muscle weakness (generalized) (M62.81);History of falling (Z91.81)     Time: 3016-0109 PT Time Calculation (min) (ACUTE ONLY): 22 min  Charges:  $  Therapeutic Exercise: 8-22 mins                    G Codes:       [name] B. Migdalia Dk PT, DPT Acute Rehabilitation  [phone] Pager [phone]     [name] 09/26/2017, 11:02 AM

## 2017-09-27 ENCOUNTER — Other Ambulatory Visit: Payer: Self-pay | Admitting: Pharmacy Technician

## 2017-09-27 ENCOUNTER — Inpatient Hospital Stay (HOSPITAL_COMMUNITY): Payer: PPO

## 2017-09-27 ENCOUNTER — Ambulatory Visit: Payer: Self-pay | Admitting: Pharmacy Technician

## 2017-09-27 ENCOUNTER — Encounter (HOSPITAL_COMMUNITY): Payer: Self-pay | Admitting: General Surgery

## 2017-09-27 DIAGNOSIS — Z7189 Other specified counseling: Secondary | ICD-10-CM

## 2017-09-27 LAB — BASIC METABOLIC PANEL
ANION GAP: 9 (ref 5–15)
BUN: 10 mg/dL (ref 8–23)
CALCIUM: 7.7 mg/dL — AB (ref 8.9–10.3)
CO2: 31 mmol/L (ref 22–32)
CREATININE: 0.68 mg/dL (ref 0.61–1.24)
Chloride: 96 mmol/L — ABNORMAL LOW (ref 98–111)
GLUCOSE: 164 mg/dL — AB (ref 70–99)
Potassium: 3.3 mmol/L — ABNORMAL LOW (ref 3.5–5.1)
Sodium: 136 mmol/L (ref 135–145)

## 2017-09-27 LAB — CBC
HCT: 34.7 % — ABNORMAL LOW (ref 39.0–52.0)
Hemoglobin: 11.2 g/dL — ABNORMAL LOW (ref 13.0–17.0)
MCH: 28.6 pg (ref 26.0–34.0)
MCHC: 32.3 g/dL (ref 30.0–36.0)
MCV: 88.5 fL (ref 78.0–100.0)
PLATELETS: 294 10*3/uL (ref 150–400)
RBC: 3.92 MIL/uL — ABNORMAL LOW (ref 4.22–5.81)
RDW: 15.8 % — AB (ref 11.5–15.5)
WBC: 7.3 10*3/uL (ref 4.0–10.5)

## 2017-09-27 LAB — GLUCOSE, CAPILLARY
Glucose-Capillary: 173 mg/dL — ABNORMAL HIGH (ref 70–99)
Glucose-Capillary: 145 mg/dL — ABNORMAL HIGH (ref 70–99)
Glucose-Capillary: 155 mg/dL — ABNORMAL HIGH (ref 70–99)
Glucose-Capillary: 148 mg/dL — ABNORMAL HIGH (ref 70–99)

## 2017-09-27 LAB — MAGNESIUM: Magnesium: 2 mg/dL (ref 1.7–2.4)

## 2017-09-27 MED ORDER — CLONIDINE HCL 0.2 MG/24HR TD PTWK
0.2000 mg | MEDICATED_PATCH | TRANSDERMAL | Status: DC
  Filled 2017-09-27: qty 1

## 2017-09-27 MED ORDER — DIATRIZOATE MEGLUMINE & SODIUM 66-10 % PO SOLN
90.0000 mL | Freq: Once | ORAL | Status: AC
  Administered 2017-09-27: 90 mL via NASOGASTRIC
  Filled 2017-09-27: qty 90

## 2017-09-27 MED ORDER — KCL IN DEXTROSE-NACL 20-5-0.45 MEQ/L-%-% IV SOLN
INTRAVENOUS | Status: DC
  Administered 2017-09-27: 13:00:00 via INTRAVENOUS
  Administered 2017-09-28: 1000 mL via INTRAVENOUS
  Administered 2017-09-28 – 2017-09-29 (×3): via INTRAVENOUS
  Administered 2017-09-29: 1000 mL via INTRAVENOUS
  Administered 2017-09-30: 13:00:00 via INTRAVENOUS
  Filled 2017-09-27 (×6): qty 1000

## 2017-09-27 MED ORDER — FAMOTIDINE IN NACL 20-0.9 MG/50ML-% IV SOLN
20.0000 mg | INTRAVENOUS | Status: DC
  Administered 2017-09-27 – 2017-10-01 (×5): 20 mg via INTRAVENOUS
  Filled 2017-09-27 (×6): qty 50

## 2017-09-27 MED ORDER — POTASSIUM CHLORIDE 10 MEQ/100ML IV SOLN
10.0000 meq | INTRAVENOUS | Status: DC
  Administered 2017-09-27 (×2): 10 meq via INTRAVENOUS
  Filled 2017-09-27 (×2): qty 100

## 2017-09-27 MED ORDER — INSULIN ASPART 100 UNIT/ML ~~LOC~~ SOLN
0.0000 [IU] | SUBCUTANEOUS | Status: DC
  Administered 2017-09-27: 2 [IU] via SUBCUTANEOUS
  Administered 2017-09-27: 3 [IU] via SUBCUTANEOUS
  Administered 2017-09-28 – 2017-09-29 (×9): 2 [IU] via SUBCUTANEOUS

## 2017-09-27 MED ORDER — POTASSIUM CHLORIDE 10 MEQ/100ML IV SOLN
10.0000 meq | INTRAVENOUS | Status: DC
  Administered 2017-09-27: 10 meq via INTRAVENOUS
  Filled 2017-09-27 (×2): qty 100

## 2017-09-27 MED ORDER — SODIUM CHLORIDE 0.9 % IV SOLN
12.5000 mg | Freq: Once | INTRAVENOUS | Status: AC
  Administered 2017-09-27: 12.5 mg via INTRAVENOUS
  Filled 2017-09-27: qty 0.5

## 2017-09-27 MED ORDER — HYDRALAZINE HCL 20 MG/ML IJ SOLN
10.0000 mg | Freq: Three times a day (TID) | INTRAMUSCULAR | Status: DC
  Administered 2017-09-27 – 2017-10-01 (×12): 10 mg via INTRAVENOUS
  Filled 2017-09-27 (×11): qty 1

## 2017-09-27 MED ORDER — POTASSIUM CHLORIDE 10 MEQ/100ML IV SOLN
10.0000 meq | INTRAVENOUS | Status: AC
  Administered 2017-09-27 (×3): 10 meq via INTRAVENOUS
  Filled 2017-09-27 (×2): qty 100

## 2017-09-27 NOTE — Progress Notes (Signed)
Daily Progress Note   Patient Name: [name]       Date: 09/27/2017 DOB: March 12, 1935  Age: 83 y.o. MRN#: [id number] Attending Physician: Mendel Corning, MD Primary Care Physician: Glendale Chard, MD Admit Date: 09/19/2017  Reason for Consultation/Follow-up: Establishing goals of care  Subjective: Pt seen in follow up from initial consult done by Florentina Jenny PA yesterday (see previous note). Per that consult family is hoping for resolution of ileus and d/c to SNF, if ileus does not resolve then they are open to Hospice. They feel [name] has good quality of life and and want to continue aggressive medical care for now to see if ileus can resolve.Marland Kitchen Spouse is at bedside this morning and feels [name] is more awake and alert. She is concerned because he has not had a bowel movement or been out of the bed.   ROS  Length of Stay: 8  Current Medications: Scheduled Meds:  . chlorhexidine  15 mL Mouth Rinse BID  . cloNIDine  0.2 mg Transdermal Q Fri  . fluticasone furoate-vilanterol  1 puff Inhalation Daily  . hydrALAZINE  10 mg Intravenous Q8H  . insulin aspart  0-15 Units Subcutaneous Q4H  . mouth rinse  15 mL Mouth Rinse q12n4p  . metoCLOPramide (REGLAN) injection  5 mg Intravenous Q6H  . metoprolol tartrate  2.5 mg Intravenous Q6H    Continuous Infusions: . cefTRIAXone (ROCEPHIN)  IV Stopped (09/26/17 1531)  . chlorproMAZINE (THORAZINE) IV    . dextrose 5 % and 0.45 % NaCl with KCl 20 mEq/L      PRN Meds: acetaminophen **OR** acetaminophen, labetalol, ondansetron **OR** ondansetron (ZOFRAN) IV, RESOURCE THICKENUP CLEAR  Physical Exam  Constitutional: He appears well-developed and well-nourished.  HENT:  Head: Normocephalic.  Cardiovascular:  Anasarca in BUE  Neurological:    Awake, alert  Nursing note and vitals reviewed.           Vital Signs: BP (!) 167/89 (BP Location: Right Arm)   Pulse 93   Temp 98.9 F (37.2 C) (Oral)   Resp (!) 22   Ht 6\' 1"  (1.854 m)   Wt 74.7 kg (164 lb 10.9 oz)   SpO2 96%   BMI 21.73 kg/m  SpO2: SpO2: 96 % O2 Device: O2 Device: Room Air O2 Flow Rate:    Intake/output  summary:   Intake/Output Summary (Last 24 hours) at 09/27/2017 1144 Last data filed at 09/27/2017 1107 Gross per 24 hour  Intake 3361.05 ml  Output 2725 ml  Net 636.05 ml   LBM: Last BM Date: 09/26/17 Baseline Weight: Weight: 72.3 kg (159 lb 6.3 oz) Most recent weight: Weight: 74.7 kg (164 lb 10.9 oz)       Palliative Assessment/Data: PPS: 10%      Patient Active Problem List   Diagnosis Date Noted  . Adynamic ileus ([place])   . Palliative care encounter   . Pressure injury of skin 09/25/2017  . Hematuria 09/19/2017  . Gross hematuria 09/19/2017  . Sepsis ([place]) 05/02/2017  . Diabetes mellitus ([place]) 12/25/2013  . Type I (juvenile type) diabetes mellitus with peripheral circulatory disorders, not stated as uncontrolled(250.71) 12/18/2013  . Acute DVT (deep venous thrombosis) ([place]) 12/12/2013  . PNA (pneumonia) 12/11/2013  . Candida esophagitis ([place]) 09/06/2013  . Chronic systolic CHF (congestive heart failure) ([place]) 09/06/2013  . CAP (community acquired pneumonia) 09/05/2013  . COPD exacerbation ([place]) 09/04/2013  . Microcytic anemia 09/03/2013  . Chronic respiratory failure with hypoxia ([place]) 09/01/2013  . Symptomatic anemia 09/01/2013  . DOE (dyspnea on exertion) 09/01/2013  . Atherosclerosis of native arteries of the extremities with ulceration ([place]) 06/24/2013  . PAD (peripheral artery disease) ([place]) 04/28/2013  . Chest pain 03/23/2013  . Nausea with vomiting 03/23/2013  . Pain in joint, ankle and foot 03/23/2013  . Dementia 07/04/2012  . URI (upper respiratory infection) 07/04/2012  . PVD (peripheral vascular disease) ([place]) 04/21/2012   . Leg pain 04/18/2012  . Upper airway cough syndrome 06/02/2007  . Hyperlipidemia 05/07/2007  . PVD 05/07/2007  . COPD COPD II  05/07/2007  . DYSPNEA 05/07/2007  . Essential hypertension 05/07/2007    Palliative Care Assessment & Plan   Patient Profile: 83 y.o. male  with past medical history of Alzheimer's dementia, CHF with EF of 20%, DM2, PVD s/p stenting and graft, colon cancer s/p resection who was admitted on 09/19/2017 with hematuria.  He was found to have a ruptured diverticulum in his bladder.  He also had acute kidney injury.  Shortly after admission he developed abdominal pain and was found to have an ileus.  An n/g has been placed for decompression that is putting out thick black/green liquid.  Thus far his ileus has not resolved and he has not been able to take in any nutrition since admission on 7/1.   Per urology the perforated bladder will heal on it's own.  A foley will remain in place for 2 weeks to keep the bladder decompressed and allow it to heal.  Assessment/Recommendations/Plan   ? GI/Surgery consult for ileus- family does desire aggressive care at this time for ileus- if it appears ileus will not improve- then they understand this may be end of life sitauation  PMT will continue to follow  Goals of Care and Additional Recommendations:  Limitations on Scope of Treatment: Full Scope Treatment  Code Status:  DNR  Prognosis:   Unable to determine  Discharge Planning:  To Be Determined- if ileus resolves will need SNF- if does not resolve then residential Hospice  Care plan was discussed with patient's spouse.   Thank you for allowing the Palliative Medicine Team to assist in the care of this patient.   Time In: 1045 Time Out: 1125 Total Time 35 mins Prolonged Time Billed no      Greater than 50%  of this time was spent  counseling and coordinating care related to the above assessment and plan.  Mariana Kaufman, AGNP-C Palliative Medicine   Please  contact Palliative Medicine Team phone at [phone] for questions and concerns.

## 2017-09-27 NOTE — Progress Notes (Addendum)
[facility]                                                                              Patient Demographics  [name], is a 83 y.o. male, DOB - 1935/03/03, OYD:[id number]  Admit date - 09/19/2017   Admitting Physician Cristy Folks, MD  Outpatient Primary MD for the patient is Glendale Chard, MD  Outpatient specialists:   LOS - 8  days   Medical records reviewed and are as summarized below:    Chief Complaint  Patient presents with  . Hematuria       Brief summary   83 year old African-American male with a past medical history of dementia, peripheral vascular disease status post right to left femoral-femoral graft, right iliac stents, chronic systolic CHF with EF of 20 to 25%, COPD, type 2 diabetes, history of colon cancer status post resection with hematuria.  Patient was seen by urology and was hospitalized.  He was started on continuous bladder irrigation.  Hematuria resolved.  Foley was removed.  Subsequently he developed ileus.  Assessment & Plan    Principal Problem:  Abdominal distention secondary to ileus -Patient was noted to have worsening abdominal distention on 7/1, NG tube was placed -CT abdomen showed worsening abdominal ileus with multiple air-fluid levels findings compatible with ileus -Continue NGT suction -Abdominal x-ray showed persistent loops of dilated small bowel, placed on IV Reglan yesterday.  Still having hiccups, will give 1 dose of IV Thorazine  - consulted general surgery.  Patient has been n.p.o. since 7/1, consulted palliative for goals of care, if patient's family wants to be aggressive with artificial feeding?  Active problems Gross hematuria in the setting of Plavix, perforated urinary bladder -Initially appeared to have resolved, urology was consulted.  Patient underwent CBI. -Foley catheter was discontinued on 6/29, subsequently had multiple episodes of urinary retention on 7/1, Foley catheter was  reinserted. -Given patient continued to have abdominal pain, CT abdomen and pelvis was obtained which showed perforated urinary bladder -CT cystogram confirmed the presence of extraperitoneal bladder rupture with extravasation of contrast into the perivesical fat the prostate gland  -Urology following, continue Foley catheter, remain indwelling for 2 weeks, continue antibiotics while on catheter awaiting the healing of bladder rupture.  Patient will need cystoscopy and cystogram outpatient. -Continue IV Rocephin, NPO    Acute kidney injury with metabolic acidosis -Baseline creatinine around 1.1, 1.48 at the time of admission, plateaued at 2.09 -Patient received gentle IV fluid hydration, Foley catheter replaced -Bicarb drip discontinued, placed on D5 half normal saline  Leukocytosis -Likely due to #1 and #2, continue IV Rocephin, improving -Cultures negative so far  Acute encephalopathy on dementia -Alert and awake, closer to his baseline  History of PVD -Plavix currently held due to hematuria.  Currently n.p.o. status, will restart once able to take p.o.  Chronic systolic CHF -EF 20 to 67% per 2D echo in 08/2013 -Monitor volume status closely, Currently diuretics are on hold, on IV fluids  - digoxin on hold due to due to ileus  Accelerated hypertension -BP still uncontrolled, continue IV hydralazine 10 mg every 8 hours, added clonidine patch 0.2  mg daily, continue labetalol as needed with parameters   Diabetes mellitus type 2, NIDDM -Patient started on D5 drip today, change to sliding scale to moderate   COPD - Currently stable   Code Status: DNR DVT Prophylaxis:   SCD's Family Communication: d/w with wife and other family members at bed side    Disposition Plan: Once able to take p.o. palliative goals of care pending  Time Spent in minutes   25 minutes  Procedures:  None  Consultants:   Urology Palliative medicine  Antimicrobials:   IV  Rocephin   Medications  Scheduled Meds: . chlorhexidine  15 mL Mouth Rinse BID  . fluticasone furoate-vilanterol  1 puff Inhalation Daily  . hydrALAZINE  10 mg Intravenous Q8H  . insulin aspart  0-5 Units Subcutaneous QHS  . insulin aspart  0-9 Units Subcutaneous TID WC  . mouth rinse  15 mL Mouth Rinse q12n4p  . metoCLOPramide (REGLAN) injection  5 mg Intravenous Q6H  . metoprolol tartrate  2.5 mg Intravenous Q6H   Continuous Infusions: . cefTRIAXone (ROCEPHIN)  IV Stopped (09/26/17 1531)  . chlorproMAZINE (THORAZINE) IV    .  sodium bicarbonate  infusion 1000 mL 75 mL/hr at 09/26/17 2226   PRN Meds:.acetaminophen **OR** acetaminophen, labetalol, ondansetron **OR** ondansetron (ZOFRAN) IV, RESOURCE THICKENUP CLEAR   Antibiotics   Anti-infectives (From admission, onward)   Start     Dose/Rate Route Frequency Ordered Stop   09/26/17 1400  cefTRIAXone (ROCEPHIN) 1 g in sodium chloride 0.9 % 100 mL IVPB     1 g 200 mL/hr over 30 Minutes Intravenous Every 24 hours 09/26/17 1358     09/25/17 1345  cefTRIAXone (ROCEPHIN) 1 g in sodium chloride 0.9 % 100 mL IVPB  Status:  Discontinued     1 g 200 mL/hr over 30 Minutes Intravenous Every 24 hours 09/25/17 1334 09/25/17 1340   09/20/17 1200  cefTRIAXone (ROCEPHIN) 1 g in sodium chloride 0.9 % 100 mL IVPB     1 g 200 mL/hr over 30 Minutes Intravenous Every 24 hours 09/19/17 2009 09/25/17 1405   09/19/17 1530  cefTRIAXone (ROCEPHIN) 1 g in sodium chloride 0.9 % 100 mL IVPB     1 g 200 mL/hr over 30 Minutes Intravenous  Once 09/19/17 1518 09/19/17 1557   09/19/17 1300  cefTRIAXone (ROCEPHIN) 1 g in sodium chloride 0.9 % 100 mL IVPB  Status:  Discontinued     1 g 200 mL/hr over 30 Minutes Intravenous  Once 09/19/17 1258 09/19/17 1259        Subjective:   [name] was seen and examined today.  Alert and awake, still having hiccups.  Significant NG output.  No hematuria, no fevers or chills. BP uncontrolled.    Objective:    Vitals:   09/26/17 2201 09/26/17 2204 09/27/17 0257 09/27/17 0354  BP: (!) 183/152 (!) 201/79 (!) 167/89   Pulse: (!) 118 97 93   Resp: (!) 22 (!) 22    Temp: 98.9 F (37.2 C)     TempSrc: Oral     SpO2: 96% 96%    Weight:    74.7 kg (164 lb 10.9 oz)  Height:        Intake/Output Summary (Last 24 hours) at 09/27/2017 1117 Last data filed at 09/27/2017 1107 Gross per 24 hour  Intake 3361.05 ml  Output 2725 ml  Net 636.05 ml     Wt Readings from Last 3 Encounters:  09/27/17 74.7 kg (164 lb 10.9 oz)  09/17/17 69.9 kg (154 lb)  08/05/17 70.3 kg (155 lb)     Exam   General: Awake and alert, pleasant, dementia, NGT  Eyes:   HEENT:    Cardiovascular: S1 S2 auscultated, Regular rate and rhythm. No pedal edema b/l  Respiratory: Clear to auscultation bilaterally, no wheezing, rales or rhonchi  Gastrointestinal: Hypoactive bowel sounds, distended, nontender  Ext: no pedal edema bilaterally  Neuro: no new deficits  Musculoskeletal: No digital cyanosis, clubbing  Skin: No rashes  Psych: awake and alert, pleasant   Data Reviewed:  I have personally reviewed following labs and imaging studies  Micro Results Recent Results (from the past 240 hour(s))  Urine culture     Status: None   Collection Time: 09/17/17  4:31 PM  Result Value Ref Range Status   Specimen Description URINE, CLEAN CATCH  Final   Special Requests NONE  Final   Culture   Final    NO GROWTH Performed at [facility], [address] [place], [place] [address]    Report Status 09/18/2017 FINAL  Final  Culture, blood (routine x 2)     Status: None   Collection Time: 09/19/17 12:13 PM  Result Value Ref Range Status   Specimen Description BLOOD RIGHT ANTECUBITAL  Final   Special Requests   Final    BOTTLES DRAWN AEROBIC AND ANAEROBIC Blood Culture adequate volume   Culture   Final    NO GROWTH 5 DAYS Performed at [facility], [place] [address], [place], [place] [address]    Report  Status 09/24/2017 FINAL  Final  Culture, blood (routine x 2)     Status: None   Collection Time: 09/19/17  1:30 PM  Result Value Ref Range Status   Specimen Description BLOOD LEFT ANTECUBITAL  Final   Special Requests   Final    BOTTLES DRAWN AEROBIC AND ANAEROBIC Blood Culture adequate volume   Culture   Final    NO GROWTH 5 DAYS Performed at [facility], [place] [address], [place], [place] [address]    Report Status 09/24/2017 FINAL  Final  Urine culture     Status: None   Collection Time: 09/19/17  2:56 PM  Result Value Ref Range Status   Specimen Description URINE, CLEAN CATCH  Final   Special Requests NONE  Final   Culture   Final    NO GROWTH Performed at [facility], [place] [address], [place], [place] [address]    Report Status 09/20/2017 FINAL  Final    Radiology Reports Ct Abdomen Pelvis Wo Contrast  Result Date: 09/24/2017 CLINICAL DATA:  83 year old male with history of potential bowel obstruction. EXAM: CT ABDOMEN AND PELVIS WITHOUT CONTRAST TECHNIQUE: Multidetector CT imaging of the abdomen and pelvis was performed following the standard protocol without IV contrast. COMPARISON:  CT of the abdomen and pelvis 09/19/2017. FINDINGS: Lower chest: Emphysematous changes. Small right pleural effusion lying dependently. Atherosclerotic calcifications in the descending thoracic aorta as well as the left anterior descending and right coronary arteries. Nasogastric tube in the esophagus. Small hiatal hernia. Hepatobiliary: No definite cystic or solid hepatic lesions are confidently identified on today's noncontrast CT examination. Unenhanced appearance of the gallbladder is unremarkable. Pancreas: No definite pancreatic mass or peripancreatic fluid collections or inflammatory changes are noted on today's noncontrast CT examination. Spleen: Unremarkable. Adrenals/Urinary Tract: Unenhanced appearance of the adrenal glands and right kidney is unremarkable. 2 cm exophytic  low-attenuation lesion extending off the medial aspect  of the lower pole of the left kidney, incompletely characterized on today's noncontrast CT examination, but similar to prior studies, likely a tiny cyst. No hydroureteronephrosis. Urinary bladder is nearly decompressed around an indwelling Foley balloon catheter. There appears to be combination of intramural gas in the wall of the urinary bladder, as well as some extraluminal gas adjacent to the urinary bladder both anteriorly (in the space of Retzius) and superiorly. This is best appreciated on axial images 62-70 and sagittal images 54-66. Inflammatory changes are also noted throughout the adjacent soft tissues where there is also small volume of intermediate attenuation fluid, which appears to be predominantly located in the space of Retzius. Stomach/Bowel: Nasogastric tube extends into the stomach. There multiple prominent borderline dilated loops of small bowel with multiple air-fluid levels. No pathologic dilatation of colon. Gas and stool is noted throughout the colon extending to the level of the rectum. Suture line in the rectum indicative of prior partial proctocolectomy. Normal appendix. Vascular/Lymphatic: Aortic atherosclerosis. Fem-fem bypass graft incompletely evaluated on today's noncontrast CT examination. No lymphadenopathy noted in the abdomen or pelvis. Reproductive: Prostate gland and seminal vesicles are unremarkable in appearance. Other: Small volume of ascites. Trace volume of pneumoperitoneum adjacent to the urinary bladder, as discussed above. Musculoskeletal: Posterior orthopedic fixation hardware in place at L4-L5. Interbody graft at L4-L5. 13 mm of anterolisthesis of L4 upon L5. There are no aggressive appearing lytic or blastic lesions noted in the visualized portions of the skeleton. IMPRESSION: 1. Findings are highly concerning for perforated urinary bladder. This includes both intramural gas, extraluminal gas adjacent to the  urinary bladder, and gas and fluid in the space of Retzius. 2. Small volume of ascites or urine noted in the peritoneal cavity. 3. Multiple prominent borderline dilated loops of small bowel with multiple air-fluid levels. Findings are not compatible with obstruction, but may reflect a mild ileus. 4. Aortic atherosclerosis, in addition to at least 2 vessel coronary artery disease. 5. Small right pleural effusion lying dependently. 6. Emphysema. 7. Additional incidental findings, as above. These results will be called to the ordering clinician or representative by the Radiologist Assistant, and communication documented in the PACS or zVision Dashboard. Aortic Atherosclerosis (ICD10-I70.0) and Emphysema (ICD10-J43.9). Electronically Signed   By: Vinnie Langton M.D.   On: 09/24/2017 12:50   Ct Pelvis Wo Contrast  Result Date: 09/24/2017 CLINICAL DATA:  Suspected perforation of the urinary bladder on earlier noncontrast CT. Recent gross hematuria. History of diabetes, peripheral vascular disease and dementia. EXAM: CT PELVIS WITHOUT CONTRAST TECHNIQUE: Multidetector CT imaging of the pelvis was performed following the installation of 50 cc Omnipaque 300 diluted in 450 cc of saline through the indwelling Foley catheter. No intravenous contrast was administered. COMPARISON:  Noncontrast CT 09/19/2017 and 09/24/2017. FINDINGS: Urinary Tract: There is moderate anterior extravasation of contrast from the bladder into the prevesical fat. Contrast extends along the anterior abdominal wall, asymmetric to the left, admixed with extraluminal air. Contrast also extends into the prostate gland, asymmetric to the right. No contrast extension into the peritoneal cavity is seen. A Foley catheter is in place. There is mild bladder wall thickening and trabeculation. The distal ureters do not appear dilated. Bowel: Multiple borderline dilated loops of small bowel with air-fluid levels are again noted, most consistent with an ileus.  There is a rectal anastomosis. The visualized colon and appendix otherwise appear normal. Vascular/Lymphatic: No enlarged pelvic lymph nodes identified. Extensive aortic and branch vessel atherosclerosis. Postsurgical changes in both groins from previous femoral-femoral  bypass graft. Reproductive: Extravasated bladder contrast extends into the substance of the prostate gland, asymmetric to the right. The seminal vesicles appear normal. Other: No free air or peritoneal contrast. Musculoskeletal: No evidence of pelvic fracture. Postsurgical changes in the lower lumbar spine and sacroiliac degenerative changes are noted. IMPRESSION: 1. CT cystogram confirms the presence of extraperitoneal bladder rupture with extravasation of contrast into the prevesical fat and prostate gland. No peritoneal contrast demonstrated. 2. No evidence of underlying pelvic fracture. 3. Probable small bowel ileus. 4.  Aortic Atherosclerosis (ICD10-I70.0). Electronically Signed   By: Richardean Sale M.D.   On: 09/24/2017 16:49   Dg Chest Port 1 View  Result Date: 09/21/2017 CLINICAL DATA:  Dyspnea. EXAM: PORTABLE CHEST 1 VIEW COMPARISON:  05/04/2017 and prior studies. FINDINGS: Cardiomediastinal silhouette is normal. Mediastinal contours appear intact. Calcific atherosclerotic disease of the aorta. There is no evidence of pleural effusion or pneumothorax. Low lung volumes. Multiple tiny hyperdense nodules overlie bilateral lung bases, right greater than left. Osseous structures are without acute abnormality. Soft tissues are grossly normal. IMPRESSION: Multiple tiny hyperdense nodules overlie bilateral lung bases, right greater than left. These may represent pleural calcifications, pleuroparenchymal scarring or high density nodules along the bronchovascular tree of the lower lobes. Calcific atherosclerotic disease and tortuosity of the aorta. Electronically Signed   By: Fidela Salisbury M.D.   On: 09/21/2017 11:16   Dg Abd Acute  W/chest  Result Date: 09/23/2017 CLINICAL DATA:  Abdominal distension EXAM: DG ABDOMEN ACUTE W/ 1V CHEST COMPARISON:  09/21/2017, CT 09/19/2017 FINDINGS: Single-view chest demonstrates mild basilar atelectasis. Stable cardiomediastinal silhouette with aortic atherosclerosis. Tiny calcified nodules at the lung bases as before. Supine and upright views of the abdomen demonstrate no free air beneath the diaphragm. Development of multiple loops of dilated air-filled small bowel measuring up to 3.1 cm. Postsurgical changes in the pelvis. Right iliac stent. IMPRESSION: 1. No radiographic evidence for acute cardiopulmonary abnormality. 2. Development of multiple air-filled dilated loops of small bowel with some distal gas present, findings could be secondary to an ileus, however partial small bowel obstruction could also produce this appearance. Electronically Signed   By: Donavan Foil M.D.   On: 09/23/2017 01:39   Dg Abd Portable 1v  Result Date: 09/24/2017 CLINICAL DATA:  NG tube placement EXAM: PORTABLE ABDOMEN - 1 VIEW COMPARISON:  09/23/2017, CT 09/19/2017 FINDINGS: Lung bases are clear. Esophageal tube tip overlies the proximal stomach. Persistent gaseous enlargement of small bowel up to 3.2 cm. IMPRESSION: 1. Esophageal tube tip overlies the proximal stomach 2. Persistent gaseous enlargement of small bowel, suspect for bowel obstruction Electronically Signed   By: Donavan Foil M.D.   On: 09/24/2017 01:46   Dg Abd Portable 2v  Result Date: 09/27/2017 CLINICAL DATA:  Ileus EXAM: PORTABLE ABDOMEN - 2 VIEW COMPARISON:  CT abdomen and pelvis September 24, 2017 and abdominal radiographs September 26, 2017 FINDINGS: Nasogastric tube tip and side port are in the proximal stomach. There is persistent relatively mild dilatation of multiple loops of small bowel without air-fluid levels. No free air. There is moderate stool in the colon. Lung bases are clear. There is postoperative change in the pelvis and lower lumbar spine  regions. IMPRESSION: Nasogastric tube tip and side port in proximal stomach. Persistent loops of dilated small bowel, likely due to ileus or enteritis. Bowel obstruction felt to be less likely. No free air. Areas of postoperative change noted. Electronically Signed   By: Lowella Grip III M.D.   On: 09/27/2017  07:18   Dg Abd Portable 2v  Result Date: 09/26/2017 CLINICAL DATA:  83 year old male with recently diagnosed extraperitoneal bladder rupture. Ileus. EXAM: PORTABLE ABDOMEN - 2 VIEW COMPARISON:  CT Abdomen and Pelvis 09/24/2017 and earlier. FINDINGS: Portable AP supine and left lateral decubitus views at 1421 hours. No pneumoperitoneum. Enteric tube in place with side hole at the level of the gastric cardia. Negative lung bases. Mildly improved bowel gas pattern since 09/24/2017 but multiple gas-filled small bowel loops remain at the upper limits of normal to mildly dilated. There is large bowel gas present. Lower lumbar interspinous implant and pelvic surgical clips redemonstrated. No acute osseous abnormality identified. IMPRESSION: 1. Mildly improved bowel gas pattern since 09/24/2017. Multiple gas-filled small bowel loops remain compatible with a degree of residual ileus or partial obstruction. 2. No free air. Electronically Signed   By: Genevie Ann M.D.   On: 09/26/2017 15:07   Ct Renal Stone Study  Result Date: 09/19/2017 CLINICAL DATA:  Gross hematuria.  Urinary retention. EXAM: CT ABDOMEN AND PELVIS WITHOUT CONTRAST TECHNIQUE: Multidetector CT imaging of the abdomen and pelvis was performed following the standard protocol without IV contrast. COMPARISON:  None. FINDINGS: Lower chest: No acute abnormality. Hepatobiliary: No focal liver abnormality is seen. No gallstones, gallbladder wall thickening, or biliary dilatation. Pancreas: Unremarkable. No pancreatic ductal dilatation or surrounding inflammatory changes. Spleen: Normal in size without focal abnormality. Adrenals/Urinary Tract: Adrenal  glands appear normal. Kidneys appear normal. No hydronephrosis or renal obstruction is noted. Mild diffuse wall thickening of urinary bladder is noted with small diverticulum seen superiorly. Blood is noted in the dependent portion of the urinary bladder lumen. Stomach/Bowel: Stomach is within normal limits. Appendix appears normal. No evidence of bowel wall thickening, distention, or inflammatory changes. Vascular/Lymphatic: Aortic atherosclerosis. No enlarged abdominal or pelvic lymph nodes. Reproductive: Prostate is unremarkable. Other: No abdominal wall hernia or abnormality. No abdominopelvic ascites. Musculoskeletal: Postsurgical and degenerative changes are noted in the lower lumbar spine. No acute abnormality is noted. IMPRESSION: No renal or ureteral calculi are noted.  No hydronephrosis is noted. Mild diffuse urinary bladder wall thickening is noted concerning for cystitis. Small bladder diverticulum is noted superiorly. Probable hemorrhage is noted within the urinary bladder lumen; cystoscopy is recommended to rule out possible neoplasm or malignancy. Aortic Atherosclerosis (ICD10-I70.0). Electronically Signed   By: Marijo Conception, M.D.   On: 09/19/2017 15:08    Lab Data:  CBC: Recent Labs  Lab 09/22/17 0415 09/23/17 0556 09/24/17 0421 09/25/17 0421 09/27/17 0438  WBC 16.1* 17.4* 15.4* 7.7 7.3  NEUTROABS 13.4*  --   --   --   --   HGB 11.5* 12.6* 13.9 12.4* 11.2*  HCT 37.0* 39.1 43.3 38.2* 34.7*  MCV 90.5 87.9 87.7 87.0 88.5  PLT 296 369 334 343 759   Basic Metabolic Panel: Recent Labs  Lab 09/23/17 0556 09/24/17 0421 09/25/17 0421 09/25/17 1322 09/26/17 1537 09/27/17 0438  NA 135 137 138  --  139 136  K 4.4 4.8 3.6  --  3.7 3.3*  CL 107 109 106  --  98 96*  CO2 18* 15* 25  --  32 31  GLUCOSE 187* 154* 174*  --  156* 164*  BUN 29* 32* 15  --  12 10  CREATININE 1.75* 1.68* 0.71  --  0.69 0.68  CALCIUM 8.7* 8.6* 8.1*  --  8.1* 7.7*  MG  --   --   --  2.1  --  2.0  GFR: Estimated Creatinine Clearance: 77.8 mL/min (by C-G formula based on SCr of 0.68 mg/dL). Liver Function Tests: No results for input(s): AST, ALT, ALKPHOS, BILITOT, PROT, ALBUMIN in the last 168 hours. No results for input(s): LIPASE, AMYLASE in the last 168 hours. No results for input(s): AMMONIA in the last 168 hours. Coagulation Profile: No results for input(s): INR, PROTIME in the last 168 hours. Cardiac Enzymes: No results for input(s): CKTOTAL, CKMB, CKMBINDEX, TROPONINI in the last 168 hours. BNP (last 3 results) No results for input(s): PROBNP in the last 8760 hours. HbA1C: No results for input(s): HGBA1C in the last 72 hours. CBG: Recent Labs  Lab 09/26/17 0811 09/26/17 1207 09/26/17 1720 09/26/17 2202 09/27/17 0759  GLUCAP 146* 138* 130* 149* 173*   Lipid Profile: No results for input(s): CHOL, HDL, LDLCALC, TRIG, CHOLHDL, LDLDIRECT in the last 72 hours. Thyroid Function Tests: No results for input(s): TSH, T4TOTAL, FREET4, T3FREE, THYROIDAB in the last 72 hours. Anemia Panel: No results for input(s): VITAMINB12, FOLATE, FERRITIN, TIBC, IRON, RETICCTPCT in the last 72 hours. Urine analysis:    Component Value Date/Time   COLORURINE RED (A) 09/19/2017 1456   APPEARANCEUR HAZY (A) 09/19/2017 1456   LABSPEC 1.009 09/19/2017 1456   PHURINE 6.0 09/19/2017 1456   GLUCOSEU NEGATIVE 09/19/2017 1456   HGBUR LARGE (A) 09/19/2017 1456   BILIRUBINUR NEGATIVE 09/19/2017 1456   BILIRUBINUR negative 05/23/2017 0855   KETONESUR NEGATIVE 09/19/2017 1456   PROTEINUR 100 (A) 09/19/2017 1456   UROBILINOGEN 0.2 05/23/2017 0855   UROBILINOGEN 1.0 12/10/2013 1456   NITRITE NEGATIVE 09/19/2017 1456   LEUKOCYTESUR MODERATE (A) 09/19/2017 1456     Ripudeep Rai M.D. [facility] 09/27/2017, 11:17 AM  Pager: [phone] Between 7am to 7pm - call Pager - [phone]  After 7pm go to [email or web address] - password [email or web address]  Call night coverage person covering after 7pm

## 2017-09-27 NOTE — Plan of Care (Signed)
  Problem: Clinical Measurements: Goal: Ability to maintain clinical measurements within normal limits will improve Outcome: Progressing Goal: Diagnostic test results will improve Outcome: Progressing Goal: Respiratory complications will improve Outcome: Progressing Goal: Cardiovascular complication will be avoided Outcome: Progressing   Problem: Activity: Goal: Risk for activity intolerance will decrease Outcome: Progressing   Problem: Coping: Goal: Level of anxiety will decrease Outcome: Progressing   Problem: Pain Managment: Goal: General experience of comfort will improve Outcome: Progressing   Problem: Safety: Goal: Ability to remain free from injury will improve Outcome: Progressing   Problem: Skin Integrity: Goal: Risk for impaired skin integrity will decrease Outcome: Progressing

## 2017-09-27 NOTE — Progress Notes (Signed)
Place NG tube back on suction one hour after administration of GASTROGRAFIN per Dr. Donne Hazel.

## 2017-09-27 NOTE — Progress Notes (Signed)
SLP Cancellation Note  Patient Details Name: [name] MRN: [id number] DOB: 13-Aug-1934   Cancelled treatment:       Reason Eval/Treat Not Completed: Other (comment)(pt continues with NG tube with suction)   Macario Golds 09/27/2017, 7:36 AM

## 2017-09-27 NOTE — Consult Note (Signed)
[name] [name] 05, 1934  [id number].    Requesting MD: Dr. Estill Cotta Chief Complaint/Reason for Consult: ileus  HPI:  This is an 83yo black male with multiple medical problems including COPD, dementia, PVD on plavix, history of colon cancer, HTN, and DM who presented to the ED with complaints of gross hematuria.  His wife gives the entirety of the history given he is sleeping and has dementia.  He had urinary retention and underwent several I&O caths.  He continued to complain of abdominal pain and had a CT scan completed.  This revealed and extraperitoneal bladder perforation.  He was also noted to have some small bowel dilatation c/w an ileus, but no evidence of obstruction.  He did have some air and stool in his colon.  He had an NGT placed for the last 3-4 days.  We have been asked to see him today for further recommendations.  ROS: ROS: unable to obtain as the patient is sleeping and has significant dementia per the wife.  Family History  Problem Relation Age of Onset  . Stroke Mother 74  . Hypertension Mother   . Cancer Sister   . Heart disease Sister   . Heart disease Brother        Heart Disease before age 42  . Heart attack Brother   . Cancer Sister   . Cancer Brother     Past Medical History:  Diagnosis Date  . Alzheimer's dementia 2013  . Anemia    HX OF ANEMIA  . CHF (congestive heart failure) ([place])   . Colon cancer ([place])   . COPD (chronic obstructive pulmonary disease) ([place])   . Depression   . Dyspnea   . Hyperlipidemia   . Hypertension   . Pneumonia 2014  . PVD (peripheral vascular disease) ([place])   . Type II diabetes mellitus ([place])     Past Surgical History:  Procedure Laterality Date  . ABDOMINAL ANGIOGRAM N/A 04/20/2013   Procedure: ABDOMINAL ANGIOGRAM;  Surgeon: Angelia Mould, MD;  Location: [facility] CATH LAB;  Service: Cardiovascular;  Laterality: N/A;  . ABDOMINAL AORTOGRAM W/LOWER EXTREMITY N/A 11/20/2016   Procedure: ABDOMINAL  AORTOGRAM W/LOWER EXTREMITY;  Surgeon: Serafina Mitchell, MD;  Location: [place] CV LAB;  Service: Cardiovascular;  Laterality: N/A;  . CATARACT EXTRACTION Right 02/2013  . CATARACT EXTRACTION W/ INTRAOCULAR LENS IMPLANT Left 01/2013  . COLON RESECTION  1997   /enc. notes 05/17/2004 (04/28/2013)  . COLON SURGERY  1996   cancer  . ESOPHAGOGASTRODUODENOSCOPY N/A 09/05/2013   Procedure: ESOPHAGOGASTRODUODENOSCOPY (EGD);  Surgeon: Lear Ng, MD;  Location: [facility] ENDOSCOPY;  Service: Endoscopy;  Laterality: N/A;  . FEMORAL ARTERY - FEMORAL ARTERY BYPASS GRAFT     right to left/enc. notes 05/17/2004 (04/28/2013)  . FEMORAL BYPASS Right    /enc. notes 05/17/2004 (04/28/2013)  . FEMORAL-POPLITEAL BYPASS GRAFT Right 04/29/2013   Procedure:  FEMORAL-POPLITEAL ARTERY Bypass Graft with intraoperative ultrasound and arteriogarm;  Surgeon: Mal Misty, MD;  Location: [place];  Service: Vascular;  Laterality: Right;  . FIBEROPTIC BRONCHOSCOPY     Yvette Rack. notes 03/16/2005 (04/28/2013)  . INTRAOPERATIVE ARTERIOGRAM Right 04/29/2013   Procedure: INTRA OPERATIVE ARTERIOGRAM;  Surgeon: Mal Misty, MD;  Location: [place];  Service: Vascular;  Laterality: Right;  . LOWER EXTREMITY ANGIOGRAM  04/20/2013   Procedure: LOWER EXTREMITY ANGIOGRAM;  Surgeon: Angelia Mould, MD;  Location: [facility] CATH LAB;  Service: Cardiovascular;;  . LOWER EXTREMITY ANGIOGRAPHY Bilateral 06/25/2017  Procedure: LOWER EXTREMITY ANGIOGRAPHY;  Surgeon: Serafina Mitchell, MD;  Location: [place] CV LAB;  Service: Cardiovascular;  Laterality: Bilateral;  . [place] SURGERY  2005  . PERCUTANEOUS STENT INTERVENTION  04/20/2013   Procedure: PERCUTANEOUS STENT INTERVENTION;  Surgeon: Angelia Mould, MD;  Location: [facility] CATH LAB;  Service: Cardiovascular;;  right common iliac artery  . PERIPHERAL VASCULAR CATHETERIZATION N/A 01/31/2016   Procedure: Abdominal Aortogram w/Lower Extremity;  Surgeon: Serafina Mitchell, MD;  Location: [place]  CV LAB;  Service: Cardiovascular;  Laterality: N/A;  . PERIPHERAL VASCULAR CATHETERIZATION Right 01/31/2016   Procedure: Peripheral Vascular Atherectomy;  Surgeon: Serafina Mitchell, MD;  Location: [place] CV LAB;  Service: Cardiovascular;  Laterality: Right;  Iliac common and external  . PERIPHERAL VASCULAR CATHETERIZATION Right 01/31/2016   Procedure: Peripheral Vascular Intervention;  Surgeon: Serafina Mitchell, MD;  Location: [place] CV LAB;  Service: Cardiovascular;  Laterality: Right;  external iliac and common iliac  . PERIPHERAL VASCULAR CATHETERIZATION N/A 04/24/2016   Procedure: Abdominal Aortogram w/Lower Extremity;  Surgeon: Serafina Mitchell, MD;  Location: [place] CV LAB;  Service: Cardiovascular;  Laterality: N/A;  . PERIPHERAL VASCULAR CATHETERIZATION Right 04/24/2016   Procedure: Peripheral Vascular Intervention;  Surgeon: Serafina Mitchell, MD;  Location: [place] CV LAB;  Service: Cardiovascular;  Laterality: Right;  external illiac Rt  . SPINE SURGERY    . VASCULAR SURGERY      Social History:  reports that he quit smoking about 24 years ago. His smoking use included cigarettes. He has a 12.00 pack-year smoking history. He has never used smokeless tobacco. He reports that he does not drink alcohol or use drugs.  Allergies: No Known Allergies  Medications Prior to Admission  Medication Sig Dispense Refill  . acetaminophen (TYLENOL) 500 MG tablet Take 500 mg by mouth every 6 (six) hours as needed for mild pain.     Marland Kitchen albuterol (PROVENTIL) (2.5 MG/3ML) 0.083% nebulizer solution Take 2.5 mg by nebulization every 6 (six) hours as needed for wheezing or shortness of breath.    Marland Kitchen BREO ELLIPTA 100-25 MCG/INH AEPB TAKE 1 PUFF BY MOUTH EVERY DAY 60 each 4  . cephALEXin (KEFLEX) 500 MG capsule Take 2 capsules (1,000 mg total) by mouth 2 (two) times daily. 20 capsule 0  . clopidogrel (PLAVIX) 75 MG tablet TAKE 1 TABLET BY MOUTH DAILY. (Patient taking differently: TAKE 75 MG BY MOUTH  DAILY.) 90 tablet 3  . co-enzyme Q-10 30 MG capsule Take 30 mg by mouth at bedtime.    . digoxin (LANOXIN) 0.125 MG tablet Take 0.125 mg by mouth daily.     . famotidine (PEPCID) 20 MG tablet One at bedtime (Patient taking differently: Take 20 mg by mouth daily. One at bedtime) 90 tablet 1  . furosemide (LASIX) 20 MG tablet Take 1 tablet (20 mg total) by mouth daily. 5 tablet 0  . IBUPROFEN PO Take by mouth.    . metoprolol tartrate (LOPRESSOR) 25 MG tablet Take 0.5 tablets (12.5 mg total) by mouth 2 (two) times daily. 30 tablet 0  . multivitamin-iron-minerals-folic acid (CENTRUM) chewable tablet Chew 1 tablet by mouth at bedtime.    . naproxen sodium (ALEVE) 220 MG tablet Take 220 mg by mouth daily as needed (pain).    Marland Kitchen omega-3 acid ethyl esters (LOVAZA) 1 g capsule Take by mouth at bedtime.    . pravastatin (PRAVACHOL) 80 MG tablet Take 80 mg by mouth daily.  1  . sitaGLIPtin (  JANUVIA) 100 MG tablet Take 100 mg by mouth daily.    . fluticasone furoate-vilanterol (BREO ELLIPTA) 100-25 MCG/INH AEPB Inhale 1 puff into the lungs daily. (Patient not taking: Reported on 09/19/2017) 1 each 0     Physical Exam: Blood pressure 133/72, pulse 93, temperature 98.9 F (37.2 C), temperature source Oral, resp. rate 18, height '6\' 1"'$  (1.854 m), weight 74.7 kg (164 lb 10.9 oz), SpO2 95 %. General: pleasant, sleeping black male who is laying in bed in NAD HEENT: head is normocephalic, atraumatic.  Eyes closed and sleeping.  Ears and nose without any masses or lesions.  Mouth is pink and dry.  NGT in nose with some old dark bloody/bilious output.  Only 200cc documented today so far Heart: regular, rate, and rhythm.  Normal s1,s2. No obvious murmurs, gallops, or rubs noted.  Palpable radial and pedal pulses bilaterally Lungs: CTAB, no wheezes, rhonchi, or rales noted.  Respiratory effort nonlabored.  Some upper airway noises heard. Abd: soft, NT, ND, hypoactive BS, no masses, hernias, or organomegaly.  Lower  midline scar noted MS: all 4 extremities are symmetrical with no cyanosis, clubbing, or edema of BLE, but has edema of BUE. Skin: warm and dry with no masses, lesions, or rashes Psych: unable to assess secondary to dementia   Results for orders placed or performed during the hospital encounter of 09/19/17 (from the past 48 hour(s))  Glucose, capillary     Status: Abnormal   Collection Time: 09/25/17  4:30 PM  Result Value Ref Range   Glucose-Capillary 122 (H) 70 - 99 mg/dL  Glucose, capillary     Status: Abnormal   Collection Time: 09/26/17  1:43 AM  Result Value Ref Range   Glucose-Capillary 127 (H) 70 - 99 mg/dL  Glucose, capillary     Status: Abnormal   Collection Time: 09/26/17  8:11 AM  Result Value Ref Range   Glucose-Capillary 146 (H) 70 - 99 mg/dL  Glucose, capillary     Status: Abnormal   Collection Time: 09/26/17 12:07 PM  Result Value Ref Range   Glucose-Capillary 138 (H) 70 - 99 mg/dL  Basic metabolic panel     Status: Abnormal   Collection Time: 09/26/17  3:37 PM  Result Value Ref Range   Sodium 139 135 - 145 mmol/L   Potassium 3.7 3.5 - 5.1 mmol/L   Chloride 98 98 - 111 mmol/L    Comment: Please note change in reference range.   CO2 32 22 - 32 mmol/L   Glucose, Bld 156 (H) 70 - 99 mg/dL    Comment: Please note change in reference range.   BUN 12 8 - 23 mg/dL    Comment: Please note change in reference range.   Creatinine, Ser 0.69 0.61 - 1.24 mg/dL   Calcium 8.1 (L) 8.9 - 10.3 mg/dL   GFR calc non Af Amer >60 >60 mL/min   GFR calc Af Amer >60 >60 mL/min    Comment: (NOTE) The eGFR has been calculated using the CKD EPI equation. This calculation has not been validated in all clinical situations. eGFR's persistently <60 mL/min signify possible Chronic Kidney Disease.    Anion gap 9 5 - 15    Comment: Performed at [facility] [address], [place], [place] [address]  Glucose, capillary     Status: Abnormal   Collection Time: 09/26/17  5:20 PM    Result Value Ref Range   Glucose-Capillary 130 (H) 70 - 99 mg/dL  Glucose, capillary  Status: Abnormal   Collection Time: 09/26/17 10:02 PM  Result Value Ref Range   Glucose-Capillary 149 (H) 70 - 99 mg/dL  CBC     Status: Abnormal   Collection Time: 09/27/17  4:38 AM  Result Value Ref Range   WBC 7.3 4.0 - 10.5 K/uL   RBC 3.92 (L) 4.22 - 5.81 MIL/uL   Hemoglobin 11.2 (L) 13.0 - 17.0 g/dL   HCT 34.7 (L) 39.0 - 52.0 %   MCV 88.5 78.0 - 100.0 fL   MCH 28.6 26.0 - 34.0 pg   MCHC 32.3 30.0 - 36.0 g/dL   RDW 15.8 (H) 11.5 - 15.5 %   Platelets 294 150 - 400 K/uL    Comment: Performed at [facility], [place]. [address], [place], [place] [address]  Basic metabolic panel     Status: Abnormal   Collection Time: 09/27/17  4:38 AM  Result Value Ref Range   Sodium 136 135 - 145 mmol/L   Potassium 3.3 (L) 3.5 - 5.1 mmol/L   Chloride 96 (L) 98 - 111 mmol/L    Comment: Please note change in reference range.   CO2 31 22 - 32 mmol/L   Glucose, Bld 164 (H) 70 - 99 mg/dL    Comment: Please note change in reference range.   BUN 10 8 - 23 mg/dL    Comment: Please note change in reference range.   Creatinine, Ser 0.68 0.61 - 1.24 mg/dL   Calcium 7.7 (L) 8.9 - 10.3 mg/dL   GFR calc non Af Amer >60 >60 mL/min   GFR calc Af Amer >60 >60 mL/min    Comment: (NOTE) The eGFR has been calculated using the CKD EPI equation. This calculation has not been validated in all clinical situations. eGFR's persistently <60 mL/min signify possible Chronic Kidney Disease.    Anion gap 9 5 - 15    Comment: Performed at [facility] [address] [place], [place] [address]  Magnesium     Status: None   Collection Time: 09/27/17  4:38 AM  Result Value Ref Range   Magnesium 2.0 1.7 - 2.4 mg/dL    Comment: Performed at [facility] [address], [place], [place] [address]  Glucose, capillary     Status: Abnormal   Collection Time: 09/27/17  7:59 AM  Result Value Ref Range    Glucose-Capillary 173 (H) 70 - 99 mg/dL  Glucose, capillary     Status: Abnormal   Collection Time: 09/27/17 12:19 PM  Result Value Ref Range   Glucose-Capillary 145 (H) 70 - 99 mg/dL   Dg Abd Portable 2v  Result Date: 09/27/2017 CLINICAL DATA:  Ileus EXAM: PORTABLE ABDOMEN - 2 VIEW COMPARISON:  CT abdomen and pelvis September 24, 2017 and abdominal radiographs September 26, 2017 FINDINGS: Nasogastric tube tip and side port are in the proximal stomach. There is persistent relatively mild dilatation of multiple loops of small bowel without air-fluid levels. No free air. There is moderate stool in the colon. Lung bases are clear. There is postoperative change in the pelvis and lower lumbar spine regions. IMPRESSION: Nasogastric tube tip and side port in proximal stomach. Persistent loops of dilated small bowel, likely due to ileus or enteritis. Bowel obstruction felt to be less likely. No free air. Areas of postoperative change noted. Electronically Signed   By: Lowella Grip III M.D.   On: 09/27/2017 07:18   Dg Abd Portable 2v  Result Date: 09/26/2017 CLINICAL DATA:  83 year old male with recently diagnosed extraperitoneal bladder rupture. Ileus. EXAM: PORTABLE ABDOMEN - 2 VIEW COMPARISON:  CT Abdomen and Pelvis 09/24/2017 and earlier. FINDINGS: Portable AP supine and left lateral decubitus views at 1421 hours. No pneumoperitoneum. Enteric tube in place with side hole at the level of the gastric cardia. Negative lung bases. Mildly improved bowel gas pattern since 09/24/2017 but multiple gas-filled small bowel loops remain at the upper limits of normal to mildly dilated. There is large bowel gas present. Lower lumbar interspinous implant and pelvic surgical clips redemonstrated. No acute osseous abnormality identified. IMPRESSION: 1. Mildly improved bowel gas pattern since 09/24/2017. Multiple gas-filled small bowel loops remain compatible with a degree of residual ileus or partial obstruction. 2. No free air.  Electronically Signed   By: Genevie Ann M.D.   On: 09/26/2017 15:07      Assessment/Plan Ileus  The patient has some small bowel dysmotility likely reactive to bladder issues, decreased mobility, hypokalemia, overall medical comorbidities etc.  His x-rays show some bowel dilatation but not too significant.  He does have some air and stool in his colon.  Given he already has an NGT in place, we will give barium down his tube to assess how quickly it gets to the colon and to see if it will act as a cathartic to help jump start his bowel function.   His K today is 3.3.  I have increased his potassium replacement to 40 meq IV instead of just 20 meq.  Keep K normal to help with guy motility as well.   He is working with PT, but hasn't been able to get to the chair yet.  He is at least a 2 person assist and mobilizing him is very difficult.  I have encouraged nursing to at least get him to a chair to get him out of bed.   Surgery is contraindicated for an ileus.  Generally this is something that will likely improve with conservative measures and time.  Would hold on artificial nutrition at this time to see if we can get his NGT out and him taking in some nutrition within the next couple of days.  Given dark output from NGT consistent with some old bloody and bilious output, I will add 20 mg of pepcid daily to help protect the stomach.  Defer use of Reglan to primary care team.  We will follow along  Extraperitoneal bladder rupture - per uro and primary COPD Dementia DM HTN PVD   Henreitta Cea, [facility] 09/27/2017, 2:08 PM Pager: [phone]

## 2017-09-27 NOTE — Progress Notes (Signed)
Initial Nutrition Assessment  DOCUMENTATION CODES:   Not applicable  INTERVENTION:   -RD will follow for diet advancement and supplement as appropriate -If prolonged NPO status is anticipated, consider initiation of nutrition support  NUTRITION DIAGNOSIS:   Inadequate oral intake related to altered GI function as evidenced by NPO status.  GOAL:   Patient will meet greater than or equal to 90% of their needs  MONITOR:   Diet advancement, Labs, Weight trends, Skin, I & O's  REASON FOR ASSESSMENT:   Low [place]    ASSESSMENT:   [name] is a 83 y.o. male with medical history significant of dementia, peripheral vascular disease status post right to left femorofemoral graft, to right iliac stents with most recent peripheral arteriogram on 07/14/2017 showing 50% stenosis in between right stents and 50% stenosis at the origin of the right to left femorofemoral bypass, chronic systolic heart failure with EF of 20 to 25% by echo on 09/05/2013 with grade 2 diastolic dysfunction, COPD, type 2 diabetes on oral hypoglycemics, hyperlipidemia, history of colon cancer status post resection remote who comes in with hematuria.   Pt admitted with gross heamturia.   7/2- NGT placed  Reviewed I/O's: +129 ml x 24 hours and -597 since admission  Pt sleeping soundly at time of visit. Hx obtained from pt wife at bedside. PTA, pt had a very hearty appetite. Pt usually consumed 3 meals per day- per wife, pt follows a very healthful diet containing a variety of foods. He did not have any swallowing difficulties PTA.   Per pt wife, pt has experienced a general decline in health over the past week. She shares that pt was experiencing swallowing difficulty since being admitted (SLP evaluated and recommended a dysphagia 1 diet with thin liquids, which is a large departure from his baseline diet per wife).   Pt wife denies any weight loss. She is very concerned over pt's lack of nutrition (NPO for 5  days). Discussed with wife rationale for NGT and NPO order as well as potential for TPN if prolonged NPO status is anticipated. Per pt wife, she is to meet with general surgery today to discuss further options. Palliative care also following pt.   Labs reviewed: K: 3.3, CBGS: 145-173 (inpatient orders for glycemic control are 0-5 units insulin aspart q HS and 0-9 units insulin aspart TID with meals).   NUTRITION - FOCUSED PHYSICAL EXAM:    Most Recent Value  Orbital Region  No depletion  Upper Arm Region  No depletion  Thoracic and Lumbar Region  No depletion  Buccal Region  No depletion  Temple Region  Mild depletion  Clavicle Bone Region  No depletion  Clavicle and Acromion Bone Region  No depletion  Scapular Bone Region  No depletion  Dorsal Hand  No depletion  Patellar Region  Mild depletion  Anterior Thigh Region  No depletion  Posterior Calf Region  No depletion  Edema (RD Assessment)  Moderate  Hair  Reviewed  Eyes  Reviewed  Mouth  Reviewed  Skin  Reviewed  Nails  Reviewed       Diet Order:   Diet Order           Diet NPO time specified  Diet effective now          EDUCATION NEEDS:   Education needs have been addressed  Skin:  Skin Assessment: Skin Integrity Issues: Skin Integrity Issues:: Stage I Stage I: heel  Last BM:  09/26/17  Height:  Ht Readings from Last 1 Encounters:  09/19/17 6\' 1"  (1.854 m)    Weight:   Wt Readings from Last 1 Encounters:  09/27/17 164 lb 10.9 oz (74.7 kg)    Ideal Body Weight:  83.6 kg  BMI:  Body mass index is 21.73 kg/m.  Estimated Nutritional Needs:   Kcal:  1850-2050  Protein:  85-100 grams  Fluid:  > 1.8 L    Kamarii Carton A. Jimmye Norman, RD, LDN, CDE Pager: [phone] After hours Pager: [phone]

## 2017-09-27 NOTE — Patient Outreach (Addendum)
[facility]) Care Management  09/27/2017  [name] 02/09/1935 [id number]   Unsuccessful outreach attempt #2, HIPAA compliant voicemail left.  Will make Outreach attempt #3 in the next 2-3 business days.  [name], [place] Management [phone]  ADDENDUM 1:31pm  [facility] Liaison contacted me to inform me that patient has been hospitalized since 06/27.   Will route note to inform [facility] RPh Denyse Amass and will discuss whether to proceed with process.  [name], [place] Management [phone]

## 2017-09-27 NOTE — Progress Notes (Signed)
Spiritual Care consult Acknowledged. Chaplain is grateful for the awareness and empathy of the clinical staff on 5W. Honoring what this family has identified as important to them. We shared a meaningful time of sharing scripture, listening, and prayer. Chaplain offered Ministry of Presence and words of Encouragement.

## 2017-09-28 ENCOUNTER — Inpatient Hospital Stay (HOSPITAL_COMMUNITY): Payer: PPO

## 2017-09-28 DIAGNOSIS — R310 Gross hematuria: Secondary | ICD-10-CM

## 2017-09-28 LAB — CBC
HCT: 36.9 % — ABNORMAL LOW (ref 39.0–52.0)
HEMOGLOBIN: 11.4 g/dL — AB (ref 13.0–17.0)
MCH: 28.4 pg (ref 26.0–34.0)
MCHC: 30.9 g/dL (ref 30.0–36.0)
MCV: 91.8 fL (ref 78.0–100.0)
PLATELETS: 321 10*3/uL (ref 150–400)
RBC: 4.02 MIL/uL — AB (ref 4.22–5.81)
RDW: 15.9 % — ABNORMAL HIGH (ref 11.5–15.5)
WBC: 8.4 10*3/uL (ref 4.0–10.5)

## 2017-09-28 LAB — BASIC METABOLIC PANEL WITH GFR
Anion gap: 8 (ref 5–15)
BUN: 10 mg/dL (ref 8–23)
CO2: 28 mmol/L (ref 22–32)
Calcium: 8.1 mg/dL — ABNORMAL LOW (ref 8.9–10.3)
Chloride: 100 mmol/L (ref 98–111)
Creatinine, Ser: 0.73 mg/dL (ref 0.61–1.24)
GFR calc Af Amer: >60 mL/min
GFR calc non Af Amer: >60 mL/min
Glucose, Bld: 125 mg/dL — ABNORMAL HIGH (ref 70–99)
Potassium: 3.9 mmol/L (ref 3.5–5.1)
Sodium: 136 mmol/L (ref 135–145)

## 2017-09-28 LAB — GLUCOSE, CAPILLARY
Glucose-Capillary: 124 mg/dL — ABNORMAL HIGH (ref 70–99)
Glucose-Capillary: 129 mg/dL — ABNORMAL HIGH (ref 70–99)
Glucose-Capillary: 129 mg/dL — ABNORMAL HIGH (ref 70–99)
Glucose-Capillary: 130 mg/dL — ABNORMAL HIGH (ref 70–99)
Glucose-Capillary: 131 mg/dL — ABNORMAL HIGH (ref 70–99)
Glucose-Capillary: 142 mg/dL — ABNORMAL HIGH (ref 70–99)

## 2017-09-28 MED ORDER — FUROSEMIDE 10 MG/ML IJ SOLN
20.0000 mg | Freq: Once | INTRAMUSCULAR | Status: AC
  Administered 2017-09-28: 20 mg via INTRAVENOUS
  Filled 2017-09-28: qty 2

## 2017-09-28 MED ORDER — SODIUM CHLORIDE 0.9 % IV SOLN
25.0000 mg | Freq: Four times a day (QID) | INTRAVENOUS | Status: DC | PRN
  Administered 2017-09-28 (×2): 25 mg via INTRAVENOUS
  Filled 2017-09-28 (×3): qty 1

## 2017-09-28 NOTE — Progress Notes (Signed)
Subjective: CC: Bladder rupture.  Hx:  Mr. Fitzhenry is making slow progress.   The foley is draining well and the urine is clear.   He is poorly arousable.   He had a KUB this AM that showed some improvement in his bowel obstruction but he remains on NG suction.    ROS:  Review of Systems  Unable to perform ROS: Patient unresponsive    Anti-infectives: Anti-infectives (From admission, onward)   Start     Dose/Rate Route Frequency Ordered Stop   09/26/17 1400  cefTRIAXone (ROCEPHIN) 1 g in sodium chloride 0.9 % 100 mL IVPB     1 g 200 mL/hr over 30 Minutes Intravenous Every 24 hours 09/26/17 1358     09/25/17 1345  cefTRIAXone (ROCEPHIN) 1 g in sodium chloride 0.9 % 100 mL IVPB  Status:  Discontinued     1 g 200 mL/hr over 30 Minutes Intravenous Every 24 hours 09/25/17 1334 09/25/17 1340   09/20/17 1200  cefTRIAXone (ROCEPHIN) 1 g in sodium chloride 0.9 % 100 mL IVPB     1 g 200 mL/hr over 30 Minutes Intravenous Every 24 hours 09/19/17 2009 09/25/17 1405   09/19/17 1530  cefTRIAXone (ROCEPHIN) 1 g in sodium chloride 0.9 % 100 mL IVPB     1 g 200 mL/hr over 30 Minutes Intravenous  Once 09/19/17 1518 09/19/17 1557   09/19/17 1300  cefTRIAXone (ROCEPHIN) 1 g in sodium chloride 0.9 % 100 mL IVPB  Status:  Discontinued     1 g 200 mL/hr over 30 Minutes Intravenous  Once 09/19/17 1258 09/19/17 1259      Current Facility-Administered Medications  Medication Dose Route Frequency Provider Last Rate Last Dose  . acetaminophen (TYLENOL) tablet 650 mg  650 mg Oral Q6H PRN Purohit, Konrad Dolores, MD   650 mg at 09/22/17 2048   Or  . acetaminophen (TYLENOL) suppository 650 mg  650 mg Rectal Q6H PRN Purohit, Shrey C, MD      . cefTRIAXone (ROCEPHIN) 1 g in sodium chloride 0.9 % 100 mL IVPB  1 g Intravenous Q24H Rai, Ripudeep K, MD 200 mL/hr at 09/27/17 1446 1 g at 09/27/17 1446  . chlorhexidine (PERIDEX) 0.12 % solution 15 mL  15 mL Mouth Rinse BID Bonnielee Haff, MD   15 mL at 09/28/17 0848  .  chlorproMAZINE (THORAZINE) 25 mg in sodium chloride 0.9 % 25 mL IVPB  25 mg Intravenous Q6H PRN Rai, Ripudeep K, MD 50 mL/hr at 09/28/17 1007 25 mg at 09/28/17 1007  . dextrose 5 % and 0.45 % NaCl with KCl 20 mEq/L infusion   Intravenous Continuous Rai, Ripudeep K, MD 75 mL/hr at 09/28/17 0400    . famotidine (PEPCID) IVPB 20 mg premix  20 mg Intravenous Q24H Saverio Danker, PA-C   Stopped at 09/27/17 1839  . fluticasone furoate-vilanterol (BREO ELLIPTA) 100-25 MCG/INH 1 puff  1 puff Inhalation Daily Purohit, Konrad Dolores, MD   1 puff at 09/28/17 0807  . hydrALAZINE (APRESOLINE) injection 10 mg  10 mg Intravenous Q8H Rai, Ripudeep K, MD   10 mg at 09/28/17 0636  . insulin aspart (novoLOG) injection 0-15 Units  0-15 Units Subcutaneous Q4H Rai, Ripudeep K, MD   2 Units at 09/28/17 0847  . labetalol (NORMODYNE,TRANDATE) injection 10 mg  10 mg Intravenous Q4H PRN Rai, Ripudeep K, MD      . MEDLINE mouth rinse  15 mL Mouth Rinse q12n4p Bonnielee Haff, MD   15 mL at 09/27/17 1817  .  metoprolol tartrate (LOPRESSOR) injection 2.5 mg  2.5 mg Intravenous Q6H Bonnielee Haff, MD   2.5 mg at 09/28/17 0511  . ondansetron (ZOFRAN) tablet 4 mg  4 mg Oral Q6H PRN Purohit, Shrey C, MD       Or  . ondansetron (ZOFRAN) injection 4 mg  4 mg Intravenous Q6H PRN Purohit, Konrad Dolores, MD   4 mg at 09/21/17 0939  . [place]   Oral PRN Bonnielee Haff, MD         Objective: Vital signs in last 24 hours: Temp:  [98.6 F (37 C)-99.4 F (37.4 C)] 98.7 F (37.1 C) (07/06 0500) Pulse Rate:  [63-100] 82 (07/06 0624) Resp:  [16-20] 18 (07/06 0500) BP: (133-188)/(62-127) 166/80 (07/06 0624) SpO2:  [95 %-100 %] 98 % (07/06 0500) Weight:  [75.7 kg (166 lb 14.2 oz)] 75.7 kg (166 lb 14.2 oz) (07/06 0500)  Intake/Output from previous day: 07/05 0701 - 07/06 0700 In: 2289.2 [I.V.:1414.2; IV Piggyback:875] Out: 2620 [Urine:1160; Emesis/NG output:1200; Stool:260] Intake/Output this shift: Total I/O In: 25 [IV  Piggyback:25] Out: -    Physical Exam  Constitutional: He appears cachectic. He appears ill.  Abdominal: Soft. There is tenderness (in the lower adbominal area with palpation. ).  Genitourinary:  Genitourinary Comments: Foley draining concentrated but clear urine.   Musculoskeletal: He exhibits edema (mild in extremities. ).  Neurological: He is unresponsive.  Vitals reviewed.   Lab Results:  Recent Labs    09/27/17 0438 09/28/17 0632  WBC 7.3 8.4  HGB 11.2* 11.4*  HCT 34.7* 36.9*  PLT 294 321   BMET Recent Labs    09/27/17 0438 09/28/17 0632  NA 136 136  K 3.3* 3.9  CL 96* 100  CO2 31 28  GLUCOSE 164* 125*  BUN 10 10  CREATININE 0.68 0.73  CALCIUM 7.7* 8.1*   PT/INR No results for input(s): LABPROT, INR in the last 72 hours. ABG No results for input(s): PHART, HCO3 in the last 72 hours.  Invalid input(s): PCO2, PO2  Studies/Results: Dg Abd Portable 1v-small Bowel Obstruction Protocol-initial, 8 Hr Delay  Result Date: 09/28/2017 CLINICAL DATA:  Small bowel obstruction, continued surveillance. EXAM: PORTABLE ABDOMEN - 1 VIEW COMPARISON:  09/26/2017.  09/27/2017. FINDINGS: Enteric contrast is now seen in the RIGHT colon, indicating a degree of improvement. Enteric contrast remains within mildly prominent small bowel loops indicating a degree of partial obstruction. NG tube tip in the stomach. Spinal fixation device spans the L4 and L5 segments. IMPRESSION: Slight improvement partial SBO. Electronically Signed   By: Staci Righter M.D.   On: 09/28/2017 07:27   Dg Abd Portable 2v  Result Date: 09/27/2017 CLINICAL DATA:  Ileus EXAM: PORTABLE ABDOMEN - 2 VIEW COMPARISON:  CT abdomen and pelvis September 24, 2017 and abdominal radiographs September 26, 2017 FINDINGS: Nasogastric tube tip and side port are in the proximal stomach. There is persistent relatively mild dilatation of multiple loops of small bowel without air-fluid levels. No free air. There is moderate stool in the colon.  Lung bases are clear. There is postoperative change in the pelvis and lower lumbar spine regions. IMPRESSION: Nasogastric tube tip and side port in proximal stomach. Persistent loops of dilated small bowel, likely due to ileus or enteritis. Bowel obstruction felt to be less likely. No free air. Areas of postoperative change noted. Electronically Signed   By: Lowella Grip III M.D.   On: 09/27/2017 07:18   Dg Abd Portable 2v  Result Date: 09/26/2017 CLINICAL DATA:  83 year old male with recently diagnosed extraperitoneal bladder rupture. Ileus. EXAM: PORTABLE ABDOMEN - 2 VIEW COMPARISON:  CT Abdomen and Pelvis 09/24/2017 and earlier. FINDINGS: Portable AP supine and left lateral decubitus views at 1421 hours. No pneumoperitoneum. Enteric tube in place with side hole at the level of the gastric cardia. Negative lung bases. Mildly improved bowel gas pattern since 09/24/2017 but multiple gas-filled small bowel loops remain at the upper limits of normal to mildly dilated. There is large bowel gas present. Lower lumbar interspinous implant and pelvic surgical clips redemonstrated. No acute osseous abnormality identified. IMPRESSION: 1. Mildly improved bowel gas pattern since 09/24/2017. Multiple gas-filled small bowel loops remain compatible with a degree of residual ileus or partial obstruction. 2. No free air. Electronically Signed   By: Genevie Ann M.D.   On: 09/26/2017 15:07   KUB reviewed.  Cultures negative to date.  Labs reviewed.  Progress notes from medical service and gen surgery reviewed.   Assessment and Plan: 1.  Hemorrhagic cystitis with resolution of hematuria on abx.  Afebrile.   2.  Extraperitoneal bladder rupture.  Foley is draining clear urine.   He has some residual lower abdominal pain.  Current plan is to leave foley for 2 weeks and repeat cystogram and consider cystoscopy, but if he requires exploration for his SBO, it might be worth repeating a cystogram prior to surgery and if there is  persistent extravasation,  repairing the bladder at the time of laparotomy.        LOS: 9 days    [name] 09/28/2017 [id number] ID: Elie Goody, male   DOB: [birth year], 83 y.o.   MRN: [id number]

## 2017-09-28 NOTE — Progress Notes (Signed)
Daily Progress Note   Patient Name: [name]       Date: 09/28/2017 DOB: Nov 18, 1934  Age: 83 y.o. MRN#: [id number] Attending Physician: Mendel Corning, MD Primary Care Physician: Glendale Chard, MD Admit Date: 09/19/2017  Reason for Consultation/Follow-up: Establishing goals of care  Subjective: Pt seen in follow up.  Prior PMT notes indicate family is hoping for resolution of ileus and d/c to SNF, if ileus does not resolve then they are open to Hospice. They feel Ejay has good quality of life and and want to continue aggressive medical care for now to see if ileus can resolve.  Spouse is at bedside this afternoon and feels Kenna is improving.  She reports being surprised when I expressed concern he may not recover.  She and family are focused on his nutrition and his wife is determined to have him spend time out of bed prior to agreeing to focus on comfort (reports being told ileus will not improve if he is just lying in bed).  ROS  Length of Stay: 9  Current Medications: Scheduled Meds:  . chlorhexidine  15 mL Mouth Rinse BID  . fluticasone furoate-vilanterol  1 puff Inhalation Daily  . hydrALAZINE  10 mg Intravenous Q8H  . insulin aspart  0-15 Units Subcutaneous Q4H  . mouth rinse  15 mL Mouth Rinse q12n4p  . metoprolol tartrate  2.5 mg Intravenous Q6H    Continuous Infusions: . cefTRIAXone (ROCEPHIN)  IV 1 g (09/28/17 1352)  . chlorproMAZINE (THORAZINE) IV 25 mg (09/28/17 1712)  . dextrose 5 % and 0.45 % NaCl with KCl 20 mEq/L 75 mL/hr at 09/28/17 2000  . famotidine (PEPCID) IV Stopped (09/28/17 1447)    PRN Meds: acetaminophen **OR** acetaminophen, chlorproMAZINE (THORAZINE) IV, labetalol, ondansetron **OR** ondansetron (ZOFRAN) IV, RESOURCE THICKENUP CLEAR  Physical Exam   Constitutional: He appears well-developed and well-nourished.  HENT:  Head: Normocephalic.  Cardiovascular:  Anasarca in BUE  Neurological:  Awake, alert  Nursing note and vitals reviewed.           Vital Signs: BP (!) 154/83 (BP Location: Right Arm)   Pulse (!) 104   Temp 99 F (37.2 C) (Oral)   Resp (!) 22   Ht 6\' 1"  (1.854 m)   Wt 75.7 kg (166 lb 14.2 oz)   SpO2  97%   BMI 22.02 kg/m  SpO2: SpO2: 97 % O2 Device: O2 Device: Room Air O2 Flow Rate:    Intake/output summary:   Intake/Output Summary (Last 24 hours) at 09/28/2017 2223 Last data filed at 09/28/2017 2000 Gross per 24 hour  Intake 1884.84 ml  Output 1450 ml  Net 434.84 ml   LBM: Last BM Date: (PTA) Baseline Weight: Weight: 72.3 kg (159 lb 6.3 oz) Most recent weight: Weight: 75.7 kg (166 lb 14.2 oz)       Palliative Assessment/Data: PPS: 10%      Patient Active Problem List   Diagnosis Date Noted  . Goals of care, counseling/discussion   . Adynamic ileus ([place])   . Palliative care encounter   . Pressure injury of skin 09/25/2017  . Hematuria 09/19/2017  . Gross hematuria 09/19/2017  . Sepsis ([place]) 05/02/2017  . Diabetes mellitus ([place]) 12/25/2013  . Type I (juvenile type) diabetes mellitus with peripheral circulatory disorders, not stated as uncontrolled(250.71) 12/18/2013  . Acute DVT (deep venous thrombosis) ([place]) 12/12/2013  . PNA (pneumonia) 12/11/2013  . Candida esophagitis ([place]) 09/06/2013  . Chronic systolic CHF (congestive heart failure) ([place]) 09/06/2013  . CAP (community acquired pneumonia) 09/05/2013  . COPD exacerbation ([place]) 09/04/2013  . Microcytic anemia 09/03/2013  . Chronic respiratory failure with hypoxia ([place]) 09/01/2013  . Symptomatic anemia 09/01/2013  . DOE (dyspnea on exertion) 09/01/2013  . Atherosclerosis of native arteries of the extremities with ulceration ([place]) 06/24/2013  . PAD (peripheral artery disease) ([place]) 04/28/2013  . Chest pain 03/23/2013  . Nausea with  vomiting 03/23/2013  . Pain in joint, ankle and foot 03/23/2013  . Dementia 07/04/2012  . URI (upper respiratory infection) 07/04/2012  . PVD (peripheral vascular disease) ([place]) 04/21/2012  . Leg pain 04/18/2012  . Upper airway cough syndrome 06/02/2007  . Hyperlipidemia 05/07/2007  . PVD 05/07/2007  . COPD COPD II  05/07/2007  . DYSPNEA 05/07/2007  . Essential hypertension 05/07/2007    Palliative Care Assessment & Plan   Patient Profile: 83 y.o. male  with past medical history of Alzheimer's dementia, CHF with EF of 20%, DM2, PVD s/p stenting and graft, colon cancer s/p resection who was admitted on 09/19/2017 with hematuria.  He was found to have a ruptured diverticulum in his bladder.  He also had acute kidney injury.  Shortly after admission he developed abdominal pain and was found to have an ileus.  An n/g has been placed for decompression that is putting out thick black/green liquid.  Thus far his ileus has not resolved and he has not been able to take in any nutrition since admission on 7/1.   Per urology the perforated bladder will heal on it's own.  A foley will remain in place for 2 weeks to keep the bladder decompressed and allow it to heal.  Assessment/Recommendations/Plan   Family continue to desire more time to see if ileus resolves.  His wife today reports that she was told by a provider during this stay that his ileus will not improve while he is still in bed.  She is adamant that before she will agree to transition to more of a focus on comfort, she wants to have him have time in a chair as it is her belief that his ileus will not improve as long as he is lying in bed.  I discussed this with bedside RN today.  PMT will continue to follow.  Follow-up meeting tomorrow at 10:00.  Goals of Care and Additional  Recommendations:  Limitations on Scope of Treatment: Full Scope Treatment  Code Status:  DNR  Prognosis:   Guarded  Discharge Planning:  To Be Determined-  if does not resolve then likely residential Hospice  Care plan was discussed with patient's spouse, daughter, son via phone, and sister.   Thank you for allowing the Palliative Medicine Team to assist in the care of this patient.   Total Time 45 mins Prolonged Time Billed no      Greater than 50%  of this time was spent counseling and coordinating care related to the above assessment and plan.  Micheline Rough, MD [facility] Team [phone]  Please contact Palliative Medicine Team phone at [phone] for questions and concerns.

## 2017-09-28 NOTE — Progress Notes (Signed)
[facility]                                                                              Patient Demographics  [name], is a 83 y.o. male, DOB - 1935-03-15, SNK:[id number]  Admit date - 09/19/2017   Admitting Physician Edwin Folks, MD  Outpatient Primary MD for the patient is Edwin Chard, MD  Outpatient specialists:   LOS - 9  days   Medical records reviewed and are as summarized below:    Chief Complaint  Patient presents with  . Hematuria       Brief summary   83 year old African-American male with a past medical history of dementia, peripheral vascular disease status post right to left femoral-femoral graft, right iliac stents, chronic systolic CHF with EF of 20 to 25%, COPD, type 2 diabetes, history of colon cancer status post resection with hematuria.  Patient was seen by urology and was hospitalized.  He was started on continuous bladder irrigation.  Hematuria resolved.  Foley was removed.  Subsequently he developed ileus.  Assessment & Plan    Principal Problem:  Abdominal distention secondary to ileus -Patient was noted to have worsening abdominal distention on 7/1, NG tube was placed -CT abdomen showed worsening abdominal ileus with multiple air-fluid levels findings compatible with ileus -Abdominal x-ray this a.m. shows a slight improvement partial SBO however patient is lethargic, appears to be aspirating, somnolent unresponsive.  Will continue NGT suction, n.p.o. -General surgery following.  - Overall no significant improvement in the last several days, discussed with palliative medicine, Dr. Domingo [name] for goals of care  Active problems Gross hematuria in the setting of Plavix, perforated urinary bladder -Initially appeared to have resolved, urology was consulted.  Patient underwent CBI. -Foley catheter was discontinued on 6/29, subsequently had multiple episodes of urinary retention on 7/1, Foley catheter was reinserted. -Given  patient continued to have abdominal pain, CT abdomen and pelvis was obtained which showed perforated urinary bladder -CT cystogram confirmed the presence of extraperitoneal bladder rupture with extravasation of contrast into the perivesical fat the prostate gland  -Urology following, continue Foley catheter, remain indwelling for 2 weeks, continue antibiotics while on catheter awaiting the healing of bladder rupture.  Patient will need cystoscopy and cystogram outpatient. -Continue IV Rocephin, NPO    Acute kidney injury with metabolic acidosis -Baseline creatinine around 1.1, 1.48 at the time of admission, plateaued at 2.09 -Currently on IV fluid hydration, creatinine improved back to baseline  Leukocytosis -Likely due to #1 and #2, continue IV Rocephin, improving -Cultures negative so far  Acute encephalopathy on dementia -Alert and awake, closer to his baseline  History of PVD -Plavix currently held due to hematuria.  Currently n.p.o. Status  Chronic systolic CHF -EF 20 to 93% per 2D echo in 08/2013 -Currently diuretics are on hold, on IV fluids  - digoxin on hold due to due to ileus -Negative balance of 506 cc  Accelerated hypertension -BP stable, continue IV hydralazine scheduled, metoprolol, labetalol as needed.    Diabetes mellitus type 2, NIDDM -Patient started on D5 drip today, change to sliding scale to moderate   COPD -Coarse breath  sounds bilaterally, possibly aspirating   Code Status: DNR DVT Prophylaxis:   SCD's Family Communication: Discussed with patient's wife and daughter, no significant improvement.  Awaiting palliative recommendation regarding goals of care.   Disposition Plan:  Time Spent in minutes   25 minutes  Procedures:  None  Consultants:   Urology Palliative medicine General surgery  Antimicrobials:   IV Rocephin   Medications  Scheduled Meds: . chlorhexidine  15 mL Mouth Rinse BID  . fluticasone furoate-vilanterol  1 puff  Inhalation Daily  . hydrALAZINE  10 mg Intravenous Q8H  . insulin aspart  0-15 Units Subcutaneous Q4H  . mouth rinse  15 mL Mouth Rinse q12n4p  . metoprolol tartrate  2.5 mg Intravenous Q6H   Continuous Infusions: . cefTRIAXone (ROCEPHIN)  IV 1 g (09/27/17 1446)  . chlorproMAZINE (THORAZINE) IV 25 mg (09/28/17 1007)  . dextrose 5 % and 0.45 % NaCl with KCl 20 mEq/L 1,000 mL (09/28/17 1219)  . famotidine (PEPCID) IV Stopped (09/27/17 1839)   PRN Meds:.acetaminophen **OR** acetaminophen, chlorproMAZINE (THORAZINE) IV, labetalol, ondansetron **OR** ondansetron (ZOFRAN) IV, RESOURCE THICKENUP CLEAR   Antibiotics   Anti-infectives (From admission, onward)   Start     Dose/Rate Route Frequency Ordered Stop   09/26/17 1400  cefTRIAXone (ROCEPHIN) 1 g in sodium chloride 0.9 % 100 mL IVPB     1 g 200 mL/hr over 30 Minutes Intravenous Every 24 hours 09/26/17 1358     09/25/17 1345  cefTRIAXone (ROCEPHIN) 1 g in sodium chloride 0.9 % 100 mL IVPB  Status:  Discontinued     1 g 200 mL/hr over 30 Minutes Intravenous Every 24 hours 09/25/17 1334 09/25/17 1340   09/20/17 1200  cefTRIAXone (ROCEPHIN) 1 g in sodium chloride 0.9 % 100 mL IVPB     1 g 200 mL/hr over 30 Minutes Intravenous Every 24 hours 09/19/17 2009 09/25/17 1405   09/19/17 1530  cefTRIAXone (ROCEPHIN) 1 g in sodium chloride 0.9 % 100 mL IVPB     1 g 200 mL/hr over 30 Minutes Intravenous  Once 09/19/17 1518 09/19/17 1557   09/19/17 1300  cefTRIAXone (ROCEPHIN) 1 g in sodium chloride 0.9 % 100 mL IVPB  Status:  Discontinued     1 g 200 mL/hr over 30 Minutes Intravenous  Once 09/19/17 1258 09/19/17 1259        Subjective:   [name] was seen and examined today.  Somnolent, unresponsive, appears to be aspirating with gurgling.  Still having hiccups.  No fever chills or hematuria.   Objective:   Vitals:   09/28/17 0500 09/28/17 0500 09/28/17 0624 09/28/17 1205  BP:  (!) 171/113 (!) 166/80 (!) 119/59  Pulse:  95 82 87    Resp:  18  18  Temp:  98.7 F (37.1 C)  97.9 F (36.6 C)  TempSrc:  Oral  Oral  SpO2:  98%  100%  Weight: 75.7 kg (166 lb 14.2 oz)     Height:        Intake/Output Summary (Last 24 hours) at 09/28/2017 1235 Last data filed at 09/28/2017 1219 Gross per 24 hour  Intake 2403.04 ml  Output 2160 ml  Net 243.04 ml     Wt Readings from Last 3 Encounters:  09/28/17 75.7 kg (166 lb 14.2 oz)  09/17/17 69.9 kg (154 lb)  08/05/17 70.3 kg (155 lb)     Exam   General: somnolent, lethargic, unresponsive     Eyes:   HEENT:    Cardiovascular: S1  S2 clear, regular rate and rhythm. 1+ pedal edema b/l  Respiratory: Coarse breath sounds bilaterally  Gastrointestinal: Soft, mildly distended, NT, hypoactive bowel sounds   Ext: 1+ pedal edema bilaterally  Neuro: somnolent and lethargic, not following commands  Musculoskeletal: No digital cyanosis, clubbing  Skin: No rashes  Psych: somnolent and lethargic    Data Reviewed:  I have personally reviewed following labs and imaging studies  Micro Results Recent Results (from the past 240 hour(s))  Culture, blood (routine x 2)     Status: None   Collection Time: 09/19/17 12:13 PM  Result Value Ref Range Status   Specimen Description BLOOD RIGHT ANTECUBITAL  Final   Special Requests   Final    BOTTLES DRAWN AEROBIC AND ANAEROBIC Blood Culture adequate volume   Culture   Final    NO GROWTH 5 DAYS Performed at [facility], [address], [place], [place] [address]    Report Status 09/24/2017 FINAL  Final  Culture, blood (routine x 2)     Status: None   Collection Time: 09/19/17  1:30 PM  Result Value Ref Range Status   Specimen Description BLOOD LEFT ANTECUBITAL  Final   Special Requests   Final    BOTTLES DRAWN AEROBIC AND ANAEROBIC Blood Culture adequate volume   Culture   Final    NO GROWTH 5 DAYS Performed at [facility], [place] [address], [place], [place] [address]    Report Status 09/24/2017 FINAL  Final   Urine culture     Status: None   Collection Time: 09/19/17  2:56 PM  Result Value Ref Range Status   Specimen Description URINE, CLEAN CATCH  Final   Special Requests NONE  Final   Culture   Final    NO GROWTH Performed at [facility], [place] [address] [place], [place] [address]    Report Status 09/20/2017 FINAL  Final    Radiology Reports Ct Abdomen Pelvis Wo Contrast  Result Date: 09/24/2017 CLINICAL DATA:  83 year old male with history of potential bowel obstruction. EXAM: CT ABDOMEN AND PELVIS WITHOUT CONTRAST TECHNIQUE: Multidetector CT imaging of the abdomen and pelvis was performed following the standard protocol without IV contrast. COMPARISON:  CT of the abdomen and pelvis 09/19/2017. FINDINGS: Lower chest: Emphysematous changes. Small right pleural effusion lying dependently. Atherosclerotic calcifications in the descending thoracic aorta as well as the left anterior descending and right coronary arteries. Nasogastric tube in the esophagus. Small hiatal hernia. Hepatobiliary: No definite cystic or solid hepatic lesions are confidently identified on today's noncontrast CT examination. Unenhanced appearance of the gallbladder is unremarkable. Pancreas: No definite pancreatic mass or peripancreatic fluid collections or inflammatory changes are noted on today's noncontrast CT examination. Spleen: Unremarkable. Adrenals/Urinary Tract: Unenhanced appearance of the adrenal glands and right kidney is unremarkable. 2 cm exophytic low-attenuation lesion extending off the medial aspect of the lower pole of the left kidney, incompletely characterized on today's noncontrast CT examination, but similar to prior studies, likely a tiny cyst. No hydroureteronephrosis. Urinary bladder is nearly decompressed around an indwelling Foley balloon catheter. There appears to be combination of intramural gas in the wall of the urinary bladder, as well as some extraluminal gas adjacent to the urinary bladder  both anteriorly (in the space of Retzius) and superiorly. This is best appreciated on axial images 62-70 and sagittal images 54-66. Inflammatory changes are also noted throughout the adjacent soft tissues where there is also small volume of intermediate attenuation fluid, which appears  to be predominantly located in the space of Retzius. Stomach/Bowel: Nasogastric tube extends into the stomach. There multiple prominent borderline dilated loops of small bowel with multiple air-fluid levels. No pathologic dilatation of colon. Gas and stool is noted throughout the colon extending to the level of the rectum. Suture line in the rectum indicative of prior partial proctocolectomy. Normal appendix. Vascular/Lymphatic: Aortic atherosclerosis. Fem-fem bypass graft incompletely evaluated on today's noncontrast CT examination. No lymphadenopathy noted in the abdomen or pelvis. Reproductive: Prostate gland and seminal vesicles are unremarkable in appearance. Other: Small volume of ascites. Trace volume of pneumoperitoneum adjacent to the urinary bladder, as discussed above. Musculoskeletal: Posterior orthopedic fixation hardware in place at L4-L5. Interbody graft at L4-L5. 13 mm of anterolisthesis of L4 upon L5. There are no aggressive appearing lytic or blastic lesions noted in the visualized portions of the skeleton. IMPRESSION: 1. Findings are highly concerning for perforated urinary bladder. This includes both intramural gas, extraluminal gas adjacent to the urinary bladder, and gas and fluid in the space of Retzius. 2. Small volume of ascites or urine noted in the peritoneal cavity. 3. Multiple prominent borderline dilated loops of small bowel with multiple air-fluid levels. Findings are not compatible with obstruction, but may reflect a mild ileus. 4. Aortic atherosclerosis, in addition to at least 2 vessel coronary artery disease. 5. Small right pleural effusion lying dependently. 6. Emphysema. 7. Additional incidental  findings, as above. These results will be called to the ordering clinician or representative by the Radiologist Assistant, and communication documented in the PACS or zVision Dashboard. Aortic Atherosclerosis (ICD10-I70.0) and Emphysema (ICD10-J43.9). Electronically Signed   By: Vinnie Langton M.D.   On: 09/24/2017 12:50   Ct Pelvis Wo Contrast  Result Date: 09/24/2017 CLINICAL DATA:  Suspected perforation of the urinary bladder on earlier noncontrast CT. Recent gross hematuria. History of diabetes, peripheral vascular disease and dementia. EXAM: CT PELVIS WITHOUT CONTRAST TECHNIQUE: Multidetector CT imaging of the pelvis was performed following the installation of 50 cc Omnipaque 300 diluted in 450 cc of saline through the indwelling Foley catheter. No intravenous contrast was administered. COMPARISON:  Noncontrast CT 09/19/2017 and 09/24/2017. FINDINGS: Urinary Tract: There is moderate anterior extravasation of contrast from the bladder into the prevesical fat. Contrast extends along the anterior abdominal wall, asymmetric to the left, admixed with extraluminal air. Contrast also extends into the prostate gland, asymmetric to the right. No contrast extension into the peritoneal cavity is seen. A Foley catheter is in place. There is mild bladder wall thickening and trabeculation. The distal ureters do not appear dilated. Bowel: Multiple borderline dilated loops of small bowel with air-fluid levels are again noted, most consistent with an ileus. There is a rectal anastomosis. The visualized colon and appendix otherwise appear normal. Vascular/Lymphatic: No enlarged pelvic lymph nodes identified. Extensive aortic and branch vessel atherosclerosis. Postsurgical changes in both groins from previous femoral-femoral bypass graft. Reproductive: Extravasated bladder contrast extends into the substance of the prostate gland, asymmetric to the right. The seminal vesicles appear normal. Other: No free air or peritoneal  contrast. Musculoskeletal: No evidence of pelvic fracture. Postsurgical changes in the lower lumbar spine and sacroiliac degenerative changes are noted. IMPRESSION: 1. CT cystogram confirms the presence of extraperitoneal bladder rupture with extravasation of contrast into the prevesical fat and prostate gland. No peritoneal contrast demonstrated. 2. No evidence of underlying pelvic fracture. 3. Probable small bowel ileus. 4.  Aortic Atherosclerosis (ICD10-I70.0). Electronically Signed   By: Richardean Sale M.D.   On: 09/24/2017 16:49  Dg Chest Port 1 View  Result Date: 09/21/2017 CLINICAL DATA:  Dyspnea. EXAM: PORTABLE CHEST 1 VIEW COMPARISON:  05/04/2017 and prior studies. FINDINGS: Cardiomediastinal silhouette is normal. Mediastinal contours appear intact. Calcific atherosclerotic disease of the aorta. There is no evidence of pleural effusion or pneumothorax. Low lung volumes. Multiple tiny hyperdense nodules overlie bilateral lung bases, right greater than left. Osseous structures are without acute abnormality. Soft tissues are grossly normal. IMPRESSION: Multiple tiny hyperdense nodules overlie bilateral lung bases, right greater than left. These may represent pleural calcifications, pleuroparenchymal scarring or high density nodules along the bronchovascular tree of the lower lobes. Calcific atherosclerotic disease and tortuosity of the aorta. Electronically Signed   By: Fidela Salisbury M.D.   On: 09/21/2017 11:16   Dg Abd Acute W/chest  Result Date: 09/23/2017 CLINICAL DATA:  Abdominal distension EXAM: DG ABDOMEN ACUTE W/ 1V CHEST COMPARISON:  09/21/2017, CT 09/19/2017 FINDINGS: Single-view chest demonstrates mild basilar atelectasis. Stable cardiomediastinal silhouette with aortic atherosclerosis. Tiny calcified nodules at the lung bases as before. Supine and upright views of the abdomen demonstrate no free air beneath the diaphragm. Development of multiple loops of dilated air-filled small bowel  measuring up to 3.1 cm. Postsurgical changes in the pelvis. Right iliac stent. IMPRESSION: 1. No radiographic evidence for acute cardiopulmonary abnormality. 2. Development of multiple air-filled dilated loops of small bowel with some distal gas present, findings could be secondary to an ileus, however partial small bowel obstruction could also produce this appearance. Electronically Signed   By: Donavan Foil M.D.   On: 09/23/2017 01:39   Dg Abd Portable 1v-small Bowel Obstruction Protocol-initial, 8 Hr Delay  Result Date: 09/28/2017 CLINICAL DATA:  Small bowel obstruction, continued surveillance. EXAM: PORTABLE ABDOMEN - 1 VIEW COMPARISON:  09/26/2017.  09/27/2017. FINDINGS: Enteric contrast is now seen in the RIGHT colon, indicating a degree of improvement. Enteric contrast remains within mildly prominent small bowel loops indicating a degree of partial obstruction. NG tube tip in the stomach. Spinal fixation device spans the L4 and L5 segments. IMPRESSION: Slight improvement partial SBO. Electronically Signed   By: Staci Righter M.D.   On: 09/28/2017 07:27   Dg Abd Portable 1v  Result Date: 09/24/2017 CLINICAL DATA:  NG tube placement EXAM: PORTABLE ABDOMEN - 1 VIEW COMPARISON:  09/23/2017, CT 09/19/2017 FINDINGS: Lung bases are clear. Esophageal tube tip overlies the proximal stomach. Persistent gaseous enlargement of small bowel up to 3.2 cm. IMPRESSION: 1. Esophageal tube tip overlies the proximal stomach 2. Persistent gaseous enlargement of small bowel, suspect for bowel obstruction Electronically Signed   By: Donavan Foil M.D.   On: 09/24/2017 01:46   Dg Abd Portable 2v  Result Date: 09/27/2017 CLINICAL DATA:  Ileus EXAM: PORTABLE ABDOMEN - 2 VIEW COMPARISON:  CT abdomen and pelvis September 24, 2017 and abdominal radiographs September 26, 2017 FINDINGS: Nasogastric tube tip and side port are in the proximal stomach. There is persistent relatively mild dilatation of multiple loops of small bowel without  air-fluid levels. No free air. There is moderate stool in the colon. Lung bases are clear. There is postoperative change in the pelvis and lower lumbar spine regions. IMPRESSION: Nasogastric tube tip and side port in proximal stomach. Persistent loops of dilated small bowel, likely due to ileus or enteritis. Bowel obstruction felt to be less likely. No free air. Areas of postoperative change noted. Electronically Signed   By: Lowella Grip III M.D.   On: 09/27/2017 07:18   Dg Abd Portable 2v  Result  Date: 09/26/2017 CLINICAL DATA:  83 year old male with recently diagnosed extraperitoneal bladder rupture. Ileus. EXAM: PORTABLE ABDOMEN - 2 VIEW COMPARISON:  CT Abdomen and Pelvis 09/24/2017 and earlier. FINDINGS: Portable AP supine and left lateral decubitus views at 1421 hours. No pneumoperitoneum. Enteric tube in place with side hole at the level of the gastric cardia. Negative lung bases. Mildly improved bowel gas pattern since 09/24/2017 but multiple gas-filled small bowel loops remain at the upper limits of normal to mildly dilated. There is large bowel gas present. Lower lumbar interspinous implant and pelvic surgical clips redemonstrated. No acute osseous abnormality identified. IMPRESSION: 1. Mildly improved bowel gas pattern since 09/24/2017. Multiple gas-filled small bowel loops remain compatible with a degree of residual ileus or partial obstruction. 2. No free air. Electronically Signed   By: Genevie Ann M.D.   On: 09/26/2017 15:07   Ct Renal Stone Study  Result Date: 09/19/2017 CLINICAL DATA:  Gross hematuria.  Urinary retention. EXAM: CT ABDOMEN AND PELVIS WITHOUT CONTRAST TECHNIQUE: Multidetector CT imaging of the abdomen and pelvis was performed following the standard protocol without IV contrast. COMPARISON:  None. FINDINGS: Lower chest: No acute abnormality. Hepatobiliary: No focal liver abnormality is seen. No gallstones, gallbladder wall thickening, or biliary dilatation. Pancreas:  Unremarkable. No pancreatic ductal dilatation or surrounding inflammatory changes. Spleen: Normal in size without focal abnormality. Adrenals/Urinary Tract: Adrenal glands appear normal. Kidneys appear normal. No hydronephrosis or renal obstruction is noted. Mild diffuse wall thickening of urinary bladder is noted with small diverticulum seen superiorly. Blood is noted in the dependent portion of the urinary bladder lumen. Stomach/Bowel: Stomach is within normal limits. Appendix appears normal. No evidence of bowel wall thickening, distention, or inflammatory changes. Vascular/Lymphatic: Aortic atherosclerosis. No enlarged abdominal or pelvic lymph nodes. Reproductive: Prostate is unremarkable. Other: No abdominal wall hernia or abnormality. No abdominopelvic ascites. Musculoskeletal: Postsurgical and degenerative changes are noted in the lower lumbar spine. No acute abnormality is noted. IMPRESSION: No renal or ureteral calculi are noted.  No hydronephrosis is noted. Mild diffuse urinary bladder wall thickening is noted concerning for cystitis. Small bladder diverticulum is noted superiorly. Probable hemorrhage is noted within the urinary bladder lumen; cystoscopy is recommended to rule out possible neoplasm or malignancy. Aortic Atherosclerosis (ICD10-I70.0). Electronically Signed   By: Marijo Conception, M.D.   On: 09/19/2017 15:08    Lab Data:  CBC: Recent Labs  Lab 09/22/17 0415 09/23/17 0556 09/24/17 0421 09/25/17 0421 09/27/17 0438 09/28/17 0632  WBC 16.1* 17.4* 15.4* 7.7 7.3 8.4  NEUTROABS 13.4*  --   --   --   --   --   HGB 11.5* 12.6* 13.9 12.4* 11.2* 11.4*  HCT 37.0* 39.1 43.3 38.2* 34.7* 36.9*  MCV 90.5 87.9 87.7 87.0 88.5 91.8  PLT 296 369 334 343 294 379   Basic Metabolic Panel: Recent Labs  Lab 09/24/17 0421 09/25/17 0421 09/25/17 1322 09/26/17 1537 09/27/17 0438 09/28/17 0632  NA 137 138  --  139 136 136  K 4.8 3.6  --  3.7 3.3* 3.9  CL 109 106  --  98 96* 100  CO2 15*  25  --  32 31 28  GLUCOSE 154* 174*  --  156* 164* 125*  BUN 32* 15  --  12 10 10   CREATININE 1.68* 0.71  --  0.69 0.68 0.73  CALCIUM 8.6* 8.1*  --  8.1* 7.7* 8.1*  MG  --   --  2.1  --  2.0  --  GFR: Estimated Creatinine Clearance: 78.9 mL/min (by C-G formula based on SCr of 0.73 mg/dL). Liver Function Tests: No results for input(s): AST, ALT, ALKPHOS, BILITOT, PROT, ALBUMIN in the last 168 hours. No results for input(s): LIPASE, AMYLASE in the last 168 hours. No results for input(s): AMMONIA in the last 168 hours. Coagulation Profile: No results for input(s): INR, PROTIME in the last 168 hours. Cardiac Enzymes: No results for input(s): CKTOTAL, CKMB, CKMBINDEX, TROPONINI in the last 168 hours. BNP (last 3 results) No results for input(s): PROBNP in the last 8760 hours. HbA1C: No results for input(s): HGBA1C in the last 72 hours. CBG: Recent Labs  Lab 09/27/17 2059 09/28/17 0009 09/28/17 0501 09/28/17 0824 09/28/17 1203  GLUCAP 148* 129* 124* 129* 130*   Lipid Profile: No results for input(s): CHOL, HDL, LDLCALC, TRIG, CHOLHDL, LDLDIRECT in the last 72 hours. Thyroid Function Tests: No results for input(s): TSH, T4TOTAL, FREET4, T3FREE, THYROIDAB in the last 72 hours. Anemia Panel: No results for input(s): VITAMINB12, FOLATE, FERRITIN, TIBC, IRON, RETICCTPCT in the last 72 hours. Urine analysis:    Component Value Date/Time   COLORURINE RED (A) 09/19/2017 1456   APPEARANCEUR HAZY (A) 09/19/2017 1456   LABSPEC 1.009 09/19/2017 1456   PHURINE 6.0 09/19/2017 1456   GLUCOSEU NEGATIVE 09/19/2017 1456   HGBUR LARGE (A) 09/19/2017 1456   BILIRUBINUR NEGATIVE 09/19/2017 1456   BILIRUBINUR negative 05/23/2017 0855   KETONESUR NEGATIVE 09/19/2017 1456   PROTEINUR 100 (A) 09/19/2017 1456   UROBILINOGEN 0.2 05/23/2017 0855   UROBILINOGEN 1.0 12/10/2013 1456   NITRITE NEGATIVE 09/19/2017 1456   LEUKOCYTESUR MODERATE (A) 09/19/2017 1456     Delva Derden M.D. [facility] 09/28/2017, 12:35 PM  Pager: [phone] Between 7am to 7pm - call Pager - [phone]  After 7pm go to [email or web address] - password [email or web address]  Call night coverage person covering after 7pm

## 2017-09-28 NOTE — Progress Notes (Signed)
Subjective/Chief Complaint: Pt sedated    Objective: Vital signs in last 24 hours: Temp:  [98.6 F (37 C)-99.4 F (37.4 C)] 98.7 F (37.1 C) (07/06 0500) Pulse Rate:  [63-100] 82 (07/06 0624) Resp:  [16-20] 18 (07/06 0500) BP: (133-188)/(62-127) 166/80 (07/06 0624) SpO2:  [95 %-100 %] 98 % (07/06 0500) Weight:  [75.7 kg (166 lb 14.2 oz)] 75.7 kg (166 lb 14.2 oz) (07/06 0500) Last BM Date: (PTA)  Intake/Output from previous day: 07/05 0701 - 07/06 0700 In: 2289.2 [I.V.:1414.2; IV Piggyback:875] Out: 2620 [Urine:1160; Emesis/NG output:1200; Stool:260] Intake/Output this shift: No intake/output data recorded.  GI: soft NT ND no rebound or guarding   Lab Results:  Recent Labs    09/27/17 0438 09/28/17 0632  WBC 7.3 8.4  HGB 11.2* 11.4*  HCT 34.7* 36.9*  PLT 294 321   BMET Recent Labs    09/26/17 1537 09/27/17 0438  NA 139 136  K 3.7 3.3*  CL 98 96*  CO2 32 31  GLUCOSE 156* 164*  BUN 12 10  CREATININE 0.69 0.68  CALCIUM 8.1* 7.7*   PT/INR No results for input(s): LABPROT, INR in the last 72 hours. ABG No results for input(s): PHART, HCO3 in the last 72 hours.  Invalid input(s): PCO2, PO2  Studies/Results: Dg Abd Portable 1v-small Bowel Obstruction Protocol-initial, 8 Hr Delay  Result Date: 09/28/2017 CLINICAL DATA:  Small bowel obstruction, continued surveillance. EXAM: PORTABLE ABDOMEN - 1 VIEW COMPARISON:  09/26/2017.  09/27/2017. FINDINGS: Enteric contrast is now seen in the RIGHT colon, indicating a degree of improvement. Enteric contrast remains within mildly prominent small bowel loops indicating a degree of partial obstruction. NG tube tip in the stomach. Spinal fixation device spans the L4 and L5 segments. IMPRESSION: Slight improvement partial SBO. Electronically Signed   By: Staci Righter M.D.   On: 09/28/2017 07:27   Dg Abd Portable 2v  Result Date: 09/27/2017 CLINICAL DATA:  Ileus EXAM: PORTABLE ABDOMEN - 2 VIEW COMPARISON:  CT abdomen and  pelvis September 24, 2017 and abdominal radiographs September 26, 2017 FINDINGS: Nasogastric tube tip and side port are in the proximal stomach. There is persistent relatively mild dilatation of multiple loops of small bowel without air-fluid levels. No free air. There is moderate stool in the colon. Lung bases are clear. There is postoperative change in the pelvis and lower lumbar spine regions. IMPRESSION: Nasogastric tube tip and side port in proximal stomach. Persistent loops of dilated small bowel, likely due to ileus or enteritis. Bowel obstruction felt to be less likely. No free air. Areas of postoperative change noted. Electronically Signed   By: Lowella Grip III M.D.   On: 09/27/2017 07:18   Dg Abd Portable 2v  Result Date: 09/26/2017 CLINICAL DATA:  83 year old male with recently diagnosed extraperitoneal bladder rupture. Ileus. EXAM: PORTABLE ABDOMEN - 2 VIEW COMPARISON:  CT Abdomen and Pelvis 09/24/2017 and earlier. FINDINGS: Portable AP supine and left lateral decubitus views at 1421 hours. No pneumoperitoneum. Enteric tube in place with side hole at the level of the gastric cardia. Negative lung bases. Mildly improved bowel gas pattern since 09/24/2017 but multiple gas-filled small bowel loops remain at the upper limits of normal to mildly dilated. There is large bowel gas present. Lower lumbar interspinous implant and pelvic surgical clips redemonstrated. No acute osseous abnormality identified. IMPRESSION: 1. Mildly improved bowel gas pattern since 09/24/2017. Multiple gas-filled small bowel loops remain compatible with a degree of residual ileus or partial obstruction. 2. No free air. Electronically  Signed   By: Genevie Ann M.D.   On: 09/26/2017 15:07    Anti-infectives: Anti-infectives (From admission, onward)   Start     Dose/Rate Route Frequency Ordered Stop   09/26/17 1400  cefTRIAXone (ROCEPHIN) 1 g in sodium chloride 0.9 % 100 mL IVPB     1 g 200 mL/hr over 30 Minutes Intravenous Every 24  hours 09/26/17 1358     09/25/17 1345  cefTRIAXone (ROCEPHIN) 1 g in sodium chloride 0.9 % 100 mL IVPB  Status:  Discontinued     1 g 200 mL/hr over 30 Minutes Intravenous Every 24 hours 09/25/17 1334 09/25/17 1340   09/20/17 1200  cefTRIAXone (ROCEPHIN) 1 g in sodium chloride 0.9 % 100 mL IVPB     1 g 200 mL/hr over 30 Minutes Intravenous Every 24 hours 09/19/17 2009 09/25/17 1405   09/19/17 1530  cefTRIAXone (ROCEPHIN) 1 g in sodium chloride 0.9 % 100 mL IVPB     1 g 200 mL/hr over 30 Minutes Intravenous  Once 09/19/17 1518 09/19/17 1557   09/19/17 1300  cefTRIAXone (ROCEPHIN) 1 g in sodium chloride 0.9 % 100 mL IVPB  Status:  Discontinued     1 g 200 mL/hr over 30 Minutes Intravenous  Once 09/19/17 1258 09/19/17 1259      Assessment/Plan: Patient Active Problem List   Diagnosis Date Noted  . Goals of care, counseling/discussion   . Adynamic ileus ([place])   . Palliative care encounter   . Pressure injury of skin 09/25/2017  . Hematuria 09/19/2017  . Gross hematuria 09/19/2017  . Sepsis ([place]) 05/02/2017  . Diabetes mellitus ([place]) 12/25/2013  . Type I (juvenile type) diabetes mellitus with peripheral circulatory disorders, not stated as uncontrolled(250.71) 12/18/2013  . Acute DVT (deep venous thrombosis) ([place]) 12/12/2013  . PNA (pneumonia) 12/11/2013  . Candida esophagitis ([place]) 09/06/2013  . Chronic systolic CHF (congestive heart failure) ([place]) 09/06/2013  . CAP (community acquired pneumonia) 09/05/2013  . COPD exacerbation ([place]) 09/04/2013  . Microcytic anemia 09/03/2013  . Chronic respiratory failure with hypoxia ([place]) 09/01/2013  . Symptomatic anemia 09/01/2013  . DOE (dyspnea on exertion) 09/01/2013  . Atherosclerosis of native arteries of the extremities with ulceration ([place]) 06/24/2013  . PAD (peripheral artery disease) ([place]) 04/28/2013  . Chest pain 03/23/2013  . Nausea with vomiting 03/23/2013  . Pain in joint, ankle and foot 03/23/2013  . Dementia 07/04/2012  .  URI (upper respiratory infection) 07/04/2012  . PVD (peripheral vascular disease) ([place]) 04/21/2012  . Leg pain 04/18/2012  . Upper airway cough syndrome 06/02/2007  . Hyperlipidemia 05/07/2007  . PVD 05/07/2007  . COPD COPD II  05/07/2007  . DYSPNEA 05/07/2007  . Essential hypertension 05/07/2007    Films better support ileus at this point  Cont NGT for now / npo    LOS: 9 days    Marcello Moores A Almus Woodham 09/28/2017

## 2017-09-28 NOTE — Progress Notes (Signed)
SLP Cancellation Note  Patient Details Name: [name] MRN: [id number] DOB: Sep 18, 1934   Cancelled treatment:       Reason Eval/Treat Not Completed: Medical issues which prohibited therapy; NG with suction continued; will re-attempt when pt medically able.   Elvina Sidle, M.S., CCC-SLP 09/28/2017, 9:42 AM

## 2017-09-29 DIAGNOSIS — R140 Abdominal distension (gaseous): Secondary | ICD-10-CM

## 2017-09-29 LAB — GLUCOSE, CAPILLARY
Glucose-Capillary: 112 mg/dL — ABNORMAL HIGH (ref 70–99)
Glucose-Capillary: 113 mg/dL — ABNORMAL HIGH (ref 70–99)
Glucose-Capillary: 124 mg/dL — ABNORMAL HIGH (ref 70–99)
Glucose-Capillary: 131 mg/dL — ABNORMAL HIGH (ref 70–99)
Glucose-Capillary: 135 mg/dL — ABNORMAL HIGH (ref 70–99)
Glucose-Capillary: 141 mg/dL — ABNORMAL HIGH (ref 70–99)
Glucose-Capillary: 142 mg/dL — ABNORMAL HIGH (ref 70–99)

## 2017-09-29 LAB — HEPATIC FUNCTION PANEL
ALBUMIN: 2.2 g/dL — AB (ref 3.5–5.0)
ALT: 18 U/L (ref 0–44)
AST: 32 U/L (ref 15–41)
Alkaline Phosphatase: 47 U/L (ref 38–126)
BILIRUBIN INDIRECT: 0.7 mg/dL (ref 0.3–0.9)
Bilirubin, Direct: 0.2 mg/dL (ref 0.0–0.2)
TOTAL PROTEIN: 5.3 g/dL — AB (ref 6.5–8.1)
Total Bilirubin: 0.9 mg/dL (ref 0.3–1.2)

## 2017-09-29 LAB — BASIC METABOLIC PANEL
Anion gap: 5 (ref 5–15)
BUN: 10 mg/dL (ref 8–23)
CALCIUM: 7.9 mg/dL — AB (ref 8.9–10.3)
CO2: 27 mmol/L (ref 22–32)
CREATININE: 0.73 mg/dL (ref 0.61–1.24)
Chloride: 105 mmol/L (ref 98–111)
Glucose, Bld: 123 mg/dL — ABNORMAL HIGH (ref 70–99)
Potassium: 4 mmol/L (ref 3.5–5.1)
SODIUM: 137 mmol/L (ref 135–145)

## 2017-09-29 LAB — CBC
HCT: 32.9 % — ABNORMAL LOW (ref 39.0–52.0)
HEMOGLOBIN: 10.2 g/dL — AB (ref 13.0–17.0)
MCH: 27.8 pg (ref 26.0–34.0)
MCHC: 31 g/dL (ref 30.0–36.0)
MCV: 89.6 fL (ref 78.0–100.0)
PLATELETS: 301 10*3/uL (ref 150–400)
RBC: 3.67 MIL/uL — ABNORMAL LOW (ref 4.22–5.81)
RDW: 15.9 % — AB (ref 11.5–15.5)
WBC: 9.2 10*3/uL (ref 4.0–10.5)

## 2017-09-29 MED ORDER — INSULIN ASPART 100 UNIT/ML ~~LOC~~ SOLN: 0.0000 [IU] | Freq: Every day | SUBCUTANEOUS | Status: DC

## 2017-09-29 MED ORDER — STARCH (THICKENING) PO POWD
ORAL | Status: DC | PRN
  Filled 2017-09-29 (×2): qty 227

## 2017-09-29 MED ORDER — INSULIN ASPART 100 UNIT/ML ~~LOC~~ SOLN
0.0000 [IU] | Freq: Three times a day (TID) | SUBCUTANEOUS | Status: DC
  Administered 2017-09-29 – 2017-09-30 (×4): 2 [IU] via SUBCUTANEOUS
  Administered 2017-10-01: 3 [IU] via SUBCUTANEOUS
  Administered 2017-10-01 – 2017-10-02 (×3): 2 [IU] via SUBCUTANEOUS

## 2017-09-29 NOTE — Progress Notes (Signed)
Daily Progress Note   Patient Name: [name]       Date: 09/29/2017 DOB: 1934/09/29  Age: 83 y.o. MRN#: [id number] Attending Physician: Mendel Corning, MD Primary Care Physician: Edwin Chard, MD Admit Date: 09/19/2017  Reason for Consultation/Follow-up: Establishing goals of care  Subjective: Met with wife and daughter at bedside today.  [name] is much more alert and interactive today.  NGT is out (he pulled it out overnight).  Plan to advance to clears.    Reviewed clinical course over the last 24 hours with family.  Plan to continue to see how he does day-by-day, but also discussed that he remains at risk for decline.  ROS  Denies complaints, but confused with dementia  Length of Stay: 10  Current Medications: Scheduled Meds:  . chlorhexidine  15 mL Mouth Rinse BID  . fluticasone furoate-vilanterol  1 puff Inhalation Daily  . hydrALAZINE  10 mg Intravenous Q8H  . insulin aspart  0-15 Units Subcutaneous TID WC  . insulin aspart  0-5 Units Subcutaneous QHS  . mouth rinse  15 mL Mouth Rinse q12n4p  . metoprolol tartrate  2.5 mg Intravenous Q6H    Continuous Infusions: . cefTRIAXone (ROCEPHIN)  IV 1 g (09/28/17 1352)  . chlorproMAZINE (THORAZINE) IV 25 mg (09/28/17 1712)  . dextrose 5 % and 0.45 % NaCl with KCl 20 mEq/L 50 mL/hr at 09/29/17 1032  . famotidine (PEPCID) IV Stopped (09/28/17 1447)    PRN Meds: acetaminophen **OR** acetaminophen, chlorproMAZINE (THORAZINE) IV, labetalol, ondansetron **OR** ondansetron (ZOFRAN) IV, RESOURCE THICKENUP CLEAR  Physical Exam  Constitutional: He appears well-developed and well-nourished.  HENT:  Head: Normocephalic.  Neurological:  Awake, alert, much more interactive.  No acute distress.  Nursing note and vitals  reviewed.           Vital Signs: BP 129/77 (BP Location: Right Arm)   Pulse 93   Temp 98.7 F (37.1 C) (Oral)   Resp 20   Ht '6\' 1"'$  (1.854 m)   Wt 76 kg (167 lb 8.8 oz)   SpO2 96%   BMI 22.11 kg/m  SpO2: SpO2: 96 % O2 Device: O2 Device: Room Air O2 Flow Rate:    Intake/output summary:   Intake/Output Summary (Last 24 hours) at 09/29/2017 1131 Last data filed at 09/29/2017 0934 Gross per  24 hour  Intake 1445.26 ml  Output 1400 ml  Net 45.26 ml   LBM: Last BM Date: 09/29/17 Baseline Weight: Weight: 72.3 kg (159 lb 6.3 oz) Most recent weight: Weight: 76 kg (167 lb 8.8 oz)       Palliative Assessment/Data: PPS: 10%      Patient Active Problem List   Diagnosis Date Noted  . Goals of care, counseling/discussion   . Adynamic ileus ([place])   . Palliative care encounter   . Pressure injury of skin 09/25/2017  . Hematuria 09/19/2017  . Gross hematuria 09/19/2017  . Sepsis ([place]) 05/02/2017  . Diabetes mellitus ([place]) 12/25/2013  . Type I (juvenile type) diabetes mellitus with peripheral circulatory disorders, not stated as uncontrolled(250.71) 12/18/2013  . Acute DVT (deep venous thrombosis) ([place]) 12/12/2013  . PNA (pneumonia) 12/11/2013  . Candida esophagitis ([place]) 09/06/2013  . Chronic systolic CHF (congestive heart failure) ([place]) 09/06/2013  . CAP (community acquired pneumonia) 09/05/2013  . COPD exacerbation ([place]) 09/04/2013  . Microcytic anemia 09/03/2013  . Chronic respiratory failure with hypoxia ([place]) 09/01/2013  . Symptomatic anemia 09/01/2013  . DOE (dyspnea on exertion) 09/01/2013  . Atherosclerosis of native arteries of the extremities with ulceration ([place]) 06/24/2013  . PAD (peripheral artery disease) ([place]) 04/28/2013  . Chest pain 03/23/2013  . Nausea with vomiting 03/23/2013  . Pain in joint, ankle and foot 03/23/2013  . Dementia 07/04/2012  . URI (upper respiratory infection) 07/04/2012  . PVD (peripheral vascular disease) ([place]) 04/21/2012  . Leg pain  04/18/2012  . Upper airway cough syndrome 06/02/2007  . Hyperlipidemia 05/07/2007  . PVD 05/07/2007  . COPD COPD II  05/07/2007  . DYSPNEA 05/07/2007  . Essential hypertension 05/07/2007    Palliative Care Assessment & Plan   Patient Profile: 83 y.o. male  with past medical history of Alzheimer's dementia, CHF with EF of 20%, DM2, PVD s/p stenting and graft, colon cancer s/p resection who was admitted on 09/19/2017 with hematuria.  He was found to have a ruptured diverticulum in his bladder.  He also had acute kidney injury.  Shortly after admission he developed abdominal pain and was found to have an ileus.  An n/g has been placed for decompression that is putting out thick black/green liquid.  Thus far his ileus has not resolved and he has not been able to take in any nutrition since admission on 7/1.   Per urology the perforated bladder will heal on it's own.  A foley will remain in place for 2 weeks to keep the bladder decompressed and allow it to heal.  Assessment/Recommendations/Plan  - [name] appears to be doing much better clinically today. Alert and interactive and plan to see if he can tolerate clears today.  Family desires to continue current care plan at this point. - PMT will continue to follow intermittently.  Please call if there are specific needs with which we can be of assistance.  Goals of Care and Additional Recommendations:  Limitations on Scope of Treatment: Full Scope Treatment  Code Status:  DNR  Prognosis:   Guarded  Discharge Planning: To Be Determined Care plan was discussed with patient's spouse, daughter, son via phone, and sister.   Thank you for allowing the Palliative Medicine Team to assist in the care of this patient.   Total Time 25 mins Prolonged Time Billed no      Greater than 50%  of this time was spent counseling and coordinating care related to the above assessment and plan.  Micheline Rough, MD [place]  Team [phone]  Please contact Palliative Medicine Team phone at [phone] for questions and concerns.

## 2017-09-29 NOTE — Progress Notes (Signed)
  Speech Language Pathology Treatment: Dysphagia  Patient Details Name: [name] MRN: [id number] DOB: 26-Aug-1934 Today's Date: 09/29/2017 Time: 6837-2902 SLP Time Calculation (min) (ACUTE ONLY): 27 min  Assessment / Plan / Recommendation Clinical Impression  Patient seen for follow-up for dysphagia; has been NPO with NG due to ileus, cleared by MD to resume clear liquids. Congested cough at baseline. Pt is alert, remains confused and requires cues for self-feeding via cup. Immediate coughing with ice chips, thin liquids; suspect reduced airway protection due to premature spillage, delayed swallow initiation. Wife denies history of dysphagia. Pt continues with oral holding intermittently, particularly when distracted by commotion in the room. Nectar was generally well-tolerated, though he did have a delayed cough x1 after prolonged oral holding, which required dry spoon cue to trigger swallow. Education provided to pt's wife re: strategies for cuing and assisting pt with self-feeding, reducing distractions in the room, and how to use thickener. SLP will follow up for tolerance, consider instrumental evaluation of swallow function.    HPI HPI: 83 year old male admitted with gross hematuria, etiology unclear. Past medical history of dementia, peripheral vascular disease status post right to left femoral-femoral graft, right iliac stents, chronic systolic CHF, COPD, type 2 diabetes, history of colon cancer status post resection with hematuria. Assessed by SLP 12/11/13 with recommendation for dys 3/thin liquids. Referred for swallowing evaluation.      SLP Plan  Continue with current plan of care       Recommendations  Diet recommendations: Nectar-thick liquid Liquids provided via: Cup;No straw Medication Administration: Crushed with puree Supervision: Staff to assist with self feeding;Full supervision/cueing for compensatory strategies Compensations: Minimize environmental distractions;Slow  rate;Small sips/bites(dry spoon to cue swallow if holding) Postural Changes and/or Swallow Maneuvers: Seated upright 90 degrees                Oral Care Recommendations: Oral care BID Follow up Recommendations: Other (comment)(tba) SLP Visit Diagnosis: Dysphagia, unspecified (R13.10) Plan: Continue with current plan of care       [name], [name], [name] Speech-Language Pathologist [phone]  Aliene Altes 09/29/2017, 12:21 PM

## 2017-09-29 NOTE — Progress Notes (Signed)
Physical Therapy Treatment Patient Details Name: [name] MRN: [id number] DOB: 06-30-1934 Today's Date: 09/29/2017    History of Present Illness Pt is a 83 y.o. M with significant PMH of dementia, PVD s/p right to left femoral-femoral graft, right iliac stents, chronic systolic CHF with EF of 93-71%, COPD, type 2 diabetes, hsitory of colon cancer s/p resection with hematuria. Subsequently developed an ileus.    PT Comments    Patient somewhat limited by lethargy but willing to participate in therapy. Continues to follow simple commands inconsistently, needing max multimodal cueing. Transferring from bed to chair with two person maximal assistance via low pivot. Unable to stand currently. D/c plan remains appropriate.     Follow Up Recommendations  SNF;Supervision/Assistance - 24 hour     Equipment Recommendations  Other (comment)(tbd)    Recommendations for Other Services       Precautions / Restrictions Precautions Precautions: Fall Restrictions Weight Bearing Restrictions: No    Mobility  Bed Mobility Overal bed mobility: Needs Assistance Bed Mobility: Supine to Sit     Supine to sit: Total assist;+2 for physical assistance     General bed mobility comments: Total assistance for progressing BLE to edge of bed and elevating trunk  Transfers Overall transfer level: Needs assistance Equipment used: 2 person hand held assist Transfers: Squat Pivot Transfers     Squat pivot transfers: Max assist;+2 physical assistance     General transfer comment: Initially trialed sit to stand but patient unable to achieve adequate hip clearance. Therefore, performed two person squat pivot transfer from bed to chair with multiple scoots.  Ambulation/Gait                 Stairs             Wheelchair Mobility    Modified Rankin (Stroke Patients Only)       Balance Overall balance assessment: Needs assistance Sitting-balance support: Bilateral upper  extremity supported;Feet supported Sitting balance-Leahy Scale: Fair Sitting balance - Comments: requiring supervision and max cueing to elevate head                                     Cognition Arousal/Alertness: Lethargic Behavior During Therapy: WFL for tasks assessed/performed Overall Cognitive Status: History of cognitive impairments - at baseline                                 General Comments: inconsistently follows one step commands with max multimodal cues      Exercises      General Comments        Pertinent Vitals/Pain Pain Assessment: Faces Faces Pain Scale: No hurt    Home Living                      Prior Function            PT Goals (current goals can now be found in the care plan section) Acute Rehab PT Goals Patient Stated Goal: none stated PT Goal Formulation: Patient unable to participate in goal setting Time For Goal Achievement: 10/13/17 Potential to Achieve Goals: Fair    Frequency    Min 2X/week      PT Plan Current plan remains appropriate    Co-evaluation              AM-PAC PT "  6 Clicks" Daily Activity  Outcome Measure  Difficulty turning over in bed (including adjusting bedclothes, sheets and blankets)?: Unable Difficulty moving from lying on back to sitting on the side of the bed? : Unable Difficulty sitting down on and standing up from a chair with arms (e.g., wheelchair, bedside commode, etc,.)?: Unable Help needed moving to and from a bed to chair (including a wheelchair)?: A Lot Help needed walking in hospital room?: Total Help needed climbing 3-5 steps with a railing? : Total 6 Click Score: 7    End of Session Equipment Utilized During Treatment: Gait belt Activity Tolerance: Patient limited by fatigue Patient left: in chair;with call bell/phone within reach;with chair alarm set;with family/visitor present Nurse Communication: Mobility status;Need for lift equipment PT  Visit Diagnosis: Muscle weakness (generalized) (M62.81);History of falling (Z91.81)     Time: 6151-8343 PT Time Calculation (min) (ACUTE ONLY): 25 min  Charges:  $Therapeutic Activity: 23-37 mins                    G Codes:      Ellamae Sia, PT, DPT Acute Rehabilitation Services  Pager: [phone]   Willy Eddy 09/29/2017, 5:14 PM

## 2017-09-29 NOTE — Progress Notes (Signed)
Subjective/Chief Complaint: nausea Pt pulled out NGT  Looks and feels well  No recorded BM but no N/V    Objective: Vital signs in last 24 hours: Temp:  [97.9 F (36.6 C)-99.1 F (37.3 C)] 98.7 F (37.1 C) (07/07 0805) Pulse Rate:  [87-107] 93 (07/07 0805) Resp:  [18-22] 20 (07/07 0805) BP: (119-159)/(59-83) 129/77 (07/07 0805) SpO2:  [95 %-100 %] 96 % (07/07 0805) Weight:  [76 kg (167 lb 8.8 oz)] 76 kg (167 lb 8.8 oz) (07/07 0500) Last BM Date: (PTA)  Intake/Output from previous day: 07/06 0701 - 07/07 0700 In: 1470.3 [I.V.:1150.3; NG/GT:70; IV Piggyback:250] Out: 800 [Urine:250; Emesis/NG output:550] Intake/Output this shift: No intake/output data recorded.  General appearance: alert and cooperative Resp: clear to auscultation bilaterally GI: SOFT NT ND   Lab Results:  Recent Labs    09/28/17 0632 09/29/17 0415  WBC 8.4 9.2  HGB 11.4* 10.2*  HCT 36.9* 32.9*  PLT 321 301   BMET Recent Labs    09/28/17 0632 09/29/17 0415  NA 136 137  K 3.9 4.0  CL 100 105  CO2 28 27  GLUCOSE 125* 123*  BUN 10 10  CREATININE 0.73 0.73  CALCIUM 8.1* 7.9*   PT/INR No results for input(s): LABPROT, INR in the last 72 hours. ABG No results for input(s): PHART, HCO3 in the last 72 hours.  Invalid input(s): PCO2, PO2  Studies/Results: Dg Abd Portable 1v-small Bowel Obstruction Protocol-initial, 8 Hr Delay  Result Date: 09/28/2017 CLINICAL DATA:  Small bowel obstruction, continued surveillance. EXAM: PORTABLE ABDOMEN - 1 VIEW COMPARISON:  09/26/2017.  09/27/2017. FINDINGS: Enteric contrast is now seen in the RIGHT colon, indicating a degree of improvement. Enteric contrast remains within mildly prominent small bowel loops indicating a degree of partial obstruction. NG tube tip in the stomach. Spinal fixation device spans the L4 and L5 segments. IMPRESSION: Slight improvement partial SBO. Electronically Signed   By: Staci Righter M.D.   On: 09/28/2017 07:27     Anti-infectives: Anti-infectives (From admission, onward)   Start     Dose/Rate Route Frequency Ordered Stop   09/26/17 1400  cefTRIAXone (ROCEPHIN) 1 g in sodium chloride 0.9 % 100 mL IVPB     1 g 200 mL/hr over 30 Minutes Intravenous Every 24 hours 09/26/17 1358     09/25/17 1345  cefTRIAXone (ROCEPHIN) 1 g in sodium chloride 0.9 % 100 mL IVPB  Status:  Discontinued     1 g 200 mL/hr over 30 Minutes Intravenous Every 24 hours 09/25/17 1334 09/25/17 1340   09/20/17 1200  cefTRIAXone (ROCEPHIN) 1 g in sodium chloride 0.9 % 100 mL IVPB     1 g 200 mL/hr over 30 Minutes Intravenous Every 24 hours 09/19/17 2009 09/25/17 1405   09/19/17 1530  cefTRIAXone (ROCEPHIN) 1 g in sodium chloride 0.9 % 100 mL IVPB     1 g 200 mL/hr over 30 Minutes Intravenous  Once 09/19/17 1518 09/19/17 1557   09/19/17 1300  cefTRIAXone (ROCEPHIN) 1 g in sodium chloride 0.9 % 100 mL IVPB  Status:  Discontinued     1 g 200 mL/hr over 30 Minutes Intravenous  Once 09/19/17 1258 09/19/17 1259      Assessment/Plan: Patient Active Problem List   Diagnosis Date Noted  . Goals of care, counseling/discussion   . Adynamic ileus ([place])   . Palliative care encounter   . Pressure injury of skin 09/25/2017  . Hematuria 09/19/2017  . Gross hematuria 09/19/2017  . Sepsis ([place])  05/02/2017  . Diabetes mellitus ([place]) 12/25/2013  . Type I (juvenile type) diabetes mellitus with peripheral circulatory disorders, not stated as uncontrolled(250.71) 12/18/2013  . Acute DVT (deep venous thrombosis) ([place]) 12/12/2013  . PNA (pneumonia) 12/11/2013  . Candida esophagitis ([place]) 09/06/2013  . Chronic systolic CHF (congestive heart failure) ([place]) 09/06/2013  . CAP (community acquired pneumonia) 09/05/2013  . COPD exacerbation ([place]) 09/04/2013  . Microcytic anemia 09/03/2013  . Chronic respiratory failure with hypoxia ([place]) 09/01/2013  . Symptomatic anemia 09/01/2013  . DOE (dyspnea on exertion) 09/01/2013  . Atherosclerosis of  native arteries of the extremities with ulceration ([place]) 06/24/2013  . PAD (peripheral artery disease) ([place]) 04/28/2013  . Chest pain 03/23/2013  . Nausea with vomiting 03/23/2013  . Pain in joint, ankle and foot 03/23/2013  . Dementia 07/04/2012  . URI (upper respiratory infection) 07/04/2012  . PVD (peripheral vascular disease) ([place]) 04/21/2012  . Leg pain 04/18/2012  . Upper airway cough syndrome 06/02/2007  . Hyperlipidemia 05/07/2007  . PVD 05/07/2007  . COPD COPD II  05/07/2007  . DYSPNEA 05/07/2007  . Essential hypertension 05/07/2007    ILEUS / PSBO  IMPROVED  TRY CLEARS    LOS: 10 days    Marcello Moores A Stefan Markarian 09/29/2017

## 2017-09-29 NOTE — Progress Notes (Addendum)
Pt pulled out NGT and IV. Daughter at bedside and she is requesting mittens, she also states that her father appears more comfortable and is breathing better without the NGT. No output out of NGT this shift, pt.'s abdomen is soft with +BS. Will notify provider on call to address need for NGT.   Addendum: Attempted to insert NGT, patients daughter requests for RN to defer and wait until she talks with provider later today. Pt does not appear to be in any acute distress.

## 2017-09-29 NOTE — Progress Notes (Signed)
Per family request and per MD will continue to hold NGT. Will continue to monitor.

## 2017-09-29 NOTE — Progress Notes (Signed)
[facility]                                                                              Patient Demographics  [name], is a 83 y.o. male, DOB - 1934-12-20, HOZ:[id number]  Admit date - 09/19/2017   Admitting Physician Cristy Folks, MD  Outpatient Primary MD for the patient is Glendale Chard, MD  Outpatient specialists:   LOS - 10  days   Medical records reviewed and are as summarized below:    Chief Complaint  Patient presents with  . Hematuria       Brief summary   83 year old African-American male with a past medical history of dementia, peripheral vascular disease status post right to left femoral-femoral graft, right iliac stents, chronic systolic CHF with EF of 20 to 25%, COPD, type 2 diabetes, history of colon cancer status post resection with hematuria.  Patient was seen by urology and was hospitalized.  He was started on continuous bladder irrigation.  Hematuria resolved.  Foley was removed.  Subsequently he developed ileus.  Assessment & Plan    Principal Problem:  Abdominal distention secondary to ileus -Patient was noted to have worsening abdominal distention on 7/1, NG tube was placed -CT abdomen showed worsening abdominal ileus with multiple air-fluid levels findings compatible with ileus -Serial abdominal x-rays were obtained, general surgery was also consulted. -Overnight, patient pulled out his NGT.  This morning he had 2 large bowel movements.  Alert and oriented, close to his baseline, confirmed by daughter at the bedside.  -Clinically appears improved, started on clear liquid diet.  SLP evaluation prior to the diet, advance slowly. -PT OT evaluation, increase ambulation, out of bed to chair every shift  Active problems Gross hematuria in the setting of Plavix, perforated urinary bladder -Initially appeared to have resolved, urology was consulted.  Patient underwent CBI. -Foley catheter was discontinued on 6/29, subsequently had  multiple episodes of urinary retention on 7/1, Foley catheter was reinserted. -Given patient continued to have abdominal pain, CT abdomen and pelvis was obtained which showed perforated urinary bladder -CT cystogram confirmed the presence of extraperitoneal bladder rupture with extravasation of contrast into the perivesical fat the prostate gland  -Urology following, continue Foley catheter, remain indwelling for 2 weeks, continue antibiotics while on catheter awaiting the healing of bladder rupture.  Patient will need cystoscopy and cystogram outpatient.   Acute kidney injury with metabolic acidosis -Baseline creatinine around 1.1, 1.48 at the time of admission, plateaued at 2.09 -Creatinine at baseline, decrease IV fluid hydration to 50 cc/hour, will KVO once patient is tolerating diet Leukocytosis -Likely due to #1 and #2, continue IV Rocephin, improving -Cultures negative so far  Acute encephalopathy on dementia -Much more alert and oriented, close to his baseline, confirmed by his daughter at the bedside  History of PVD -Plavix currently held due to hematuria.  Starting diet today, will restart Plavix at the time of discharge.  Chronic systolic CHF -EF 20 to 70% per 2D echo in 08/2013 -Currently diuretics are on hold, on IV fluids   Accelerated hypertension -BP currently stable, continue IV hydralazine, metoprolol, labetalol as needed  -Will  place on oral antihypertensives once patient is consistently tolerating diet.   Diabetes mellitus type 2, NIDDM -Currently on low-dose D5 drip decreased to 50 cc an hour, continue sliding scale insulin  COPD -Breath sounds improving today, decrease IV fluid hydration to avoid fluid overload  Code Status: DNR DVT Prophylaxis:   SCD's Family Communication: Discussed with patient's daughter at the bedside and son via phone and updated in detail.  Appreciate palliative medicine assistance, Dr. Domingo Cocking.  At this time, family is happy with the  progress and wants to continue the current management.   Disposition Plan:  Time Spent in minutes   25 minutes  Procedures:  None  Consultants:   Urology Palliative medicine General surgery  Antimicrobials:   IV Rocephin   Medications  Scheduled Meds: . chlorhexidine  15 mL Mouth Rinse BID  . fluticasone furoate-vilanterol  1 puff Inhalation Daily  . hydrALAZINE  10 mg Intravenous Q8H  . insulin aspart  0-15 Units Subcutaneous TID WC  . insulin aspart  0-5 Units Subcutaneous QHS  . mouth rinse  15 mL Mouth Rinse q12n4p  . metoprolol tartrate  2.5 mg Intravenous Q6H   Continuous Infusions: . cefTRIAXone (ROCEPHIN)  IV 1 g (09/28/17 1352)  . chlorproMAZINE (THORAZINE) IV 25 mg (09/28/17 1712)  . dextrose 5 % and 0.45 % NaCl with KCl 20 mEq/L 50 mL/hr at 09/29/17 1032  . famotidine (PEPCID) IV Stopped (09/28/17 1447)   PRN Meds:.acetaminophen **OR** acetaminophen, chlorproMAZINE (THORAZINE) IV, labetalol, ondansetron **OR** ondansetron (ZOFRAN) IV, RESOURCE THICKENUP CLEAR   Antibiotics   Anti-infectives (From admission, onward)   Start     Dose/Rate Route Frequency Ordered Stop   09/26/17 1400  cefTRIAXone (ROCEPHIN) 1 g in sodium chloride 0.9 % 100 mL IVPB     1 g 200 mL/hr over 30 Minutes Intravenous Every 24 hours 09/26/17 1358     09/25/17 1345  cefTRIAXone (ROCEPHIN) 1 g in sodium chloride 0.9 % 100 mL IVPB  Status:  Discontinued     1 g 200 mL/hr over 30 Minutes Intravenous Every 24 hours 09/25/17 1334 09/25/17 1340   09/20/17 1200  cefTRIAXone (ROCEPHIN) 1 g in sodium chloride 0.9 % 100 mL IVPB     1 g 200 mL/hr over 30 Minutes Intravenous Every 24 hours 09/19/17 2009 09/25/17 1405   09/19/17 1530  cefTRIAXone (ROCEPHIN) 1 g in sodium chloride 0.9 % 100 mL IVPB     1 g 200 mL/hr over 30 Minutes Intravenous  Once 09/19/17 1518 09/19/17 1557   09/19/17 1300  cefTRIAXone (ROCEPHIN) 1 g in sodium chloride 0.9 % 100 mL IVPB  Status:  Discontinued     1 g 200  mL/hr over 30 Minutes Intravenous  Once 09/19/17 1258 09/19/17 1259        Subjective:   [name] was seen and examined today.  Much more alert and oriented today, pulled out NGT last night.  Had 2 large bowel movements this morning.  No hiccups, no fevers or chills or hematuria.    Objective:   Vitals:   09/29/17 0000 09/29/17 0400 09/29/17 0500 09/29/17 0805  BP: (!) 147/75 (!) 159/67  129/77  Pulse: 96 (!) 107  93  Resp: (!) 22   20  Temp: 99 F (37.2 C) 99.1 F (37.3 C)  98.7 F (37.1 C)  TempSrc: Oral Oral  Oral  SpO2: 96% 95%  96%  Weight:   76 kg (167 lb 8.8 oz)   Height:  Intake/Output Summary (Last 24 hours) at 09/29/2017 1226 Last data filed at 09/29/2017 0934 Gross per 24 hour  Intake 1048.91 ml  Output 1400 ml  Net -351.09 ml     Wt Readings from Last 3 Encounters:  09/29/17 76 kg (167 lb 8.8 oz)  09/17/17 69.9 kg (154 lb)  08/05/17 70.3 kg (155 lb)     Exam    General: Much more alert and awake today, close to his baseline, NAD  Eyes:   HEENT:  Atraumatic, normocephalic  Cardiovascular: S1 S2 auscultated, RRR. No pedal edema b/l  Respiratory: Mild scattered rhonchi bilaterally, improving from yesterday  Gastrointestinal: Soft, nontender, nondistended, + bowel sounds  Ext: no pedal edema bilaterally  Neuro: no new deficits  Musculoskeletal: No digital cyanosis, clubbing  Skin: No rashes  Psych: alert and oriented, close to his baseline    Data Reviewed:  I have personally reviewed following labs and imaging studies  Micro Results Recent Results (from the past 240 hour(s))  Culture, blood (routine x 2)     Status: None   Collection Time: 09/19/17  1:30 PM  Result Value Ref Range Status   Specimen Description BLOOD LEFT ANTECUBITAL  Final   Special Requests   Final    BOTTLES DRAWN AEROBIC AND ANAEROBIC Blood Culture adequate volume   Culture   Final    NO GROWTH 5 DAYS Performed at [facility], [address], [place], [place] [address]    Report Status 09/24/2017 FINAL  Final  Urine culture     Status: None   Collection Time: 09/19/17  2:56 PM  Result Value Ref Range Status   Specimen Description URINE, CLEAN CATCH  Final   Special Requests NONE  Final   Culture   Final    NO GROWTH Performed at [facility], [place] [address] [place], [place] [address]    Report Status 09/20/2017 FINAL  Final    Radiology Reports Ct Abdomen Pelvis Wo Contrast  Result Date: 09/24/2017 CLINICAL DATA:  83 year old male with history of potential bowel obstruction. EXAM: CT ABDOMEN AND PELVIS WITHOUT CONTRAST TECHNIQUE: Multidetector CT imaging of the abdomen and pelvis was performed following the standard protocol without IV contrast. COMPARISON:  CT of the abdomen and pelvis 09/19/2017. FINDINGS: Lower chest: Emphysematous changes. Small right pleural effusion lying dependently. Atherosclerotic calcifications in the descending thoracic aorta as well as the left anterior descending and right coronary arteries. Nasogastric tube in the esophagus. Small hiatal hernia. Hepatobiliary: No definite cystic or solid hepatic lesions are confidently identified on today's noncontrast CT examination. Unenhanced appearance of the gallbladder is unremarkable. Pancreas: No definite pancreatic mass or peripancreatic fluid collections or inflammatory changes are noted on today's noncontrast CT examination. Spleen: Unremarkable. Adrenals/Urinary Tract: Unenhanced appearance of the adrenal glands and right kidney is unremarkable. 2 cm exophytic low-attenuation lesion extending off the medial aspect of the lower pole of the left kidney, incompletely characterized on today's noncontrast CT examination, but similar to prior studies, likely a tiny cyst. No hydroureteronephrosis. Urinary bladder is nearly decompressed around an indwelling Foley balloon catheter. There appears to be combination of intramural gas in the wall of the urinary  bladder, as well as some extraluminal gas adjacent to the urinary bladder both anteriorly (in the space of Retzius) and superiorly. This is best appreciated on axial images 62-70 and sagittal images 54-66. Inflammatory changes are also noted throughout the adjacent soft tissues where there is also small volume of intermediate attenuation fluid,  which appears to be predominantly located in the space of Retzius. Stomach/Bowel: Nasogastric tube extends into the stomach. There multiple prominent borderline dilated loops of small bowel with multiple air-fluid levels. No pathologic dilatation of colon. Gas and stool is noted throughout the colon extending to the level of the rectum. Suture line in the rectum indicative of prior partial proctocolectomy. Normal appendix. Vascular/Lymphatic: Aortic atherosclerosis. Fem-fem bypass graft incompletely evaluated on today's noncontrast CT examination. No lymphadenopathy noted in the abdomen or pelvis. Reproductive: Prostate gland and seminal vesicles are unremarkable in appearance. Other: Small volume of ascites. Trace volume of pneumoperitoneum adjacent to the urinary bladder, as discussed above. Musculoskeletal: Posterior orthopedic fixation hardware in place at L4-L5. Interbody graft at L4-L5. 13 mm of anterolisthesis of L4 upon L5. There are no aggressive appearing lytic or blastic lesions noted in the visualized portions of the skeleton. IMPRESSION: 1. Findings are highly concerning for perforated urinary bladder. This includes both intramural gas, extraluminal gas adjacent to the urinary bladder, and gas and fluid in the space of Retzius. 2. Small volume of ascites or urine noted in the peritoneal cavity. 3. Multiple prominent borderline dilated loops of small bowel with multiple air-fluid levels. Findings are not compatible with obstruction, but may reflect a mild ileus. 4. Aortic atherosclerosis, in addition to at least 2 vessel coronary artery disease. 5. Small right  pleural effusion lying dependently. 6. Emphysema. 7. Additional incidental findings, as above. These results will be called to the ordering clinician or representative by the Radiologist Assistant, and communication documented in the PACS or zVision Dashboard. Aortic Atherosclerosis (ICD10-I70.0) and Emphysema (ICD10-J43.9). Electronically Signed   By: Vinnie Langton M.D.   On: 09/24/2017 12:50   Ct Pelvis Wo Contrast  Result Date: 09/24/2017 CLINICAL DATA:  Suspected perforation of the urinary bladder on earlier noncontrast CT. Recent gross hematuria. History of diabetes, peripheral vascular disease and dementia. EXAM: CT PELVIS WITHOUT CONTRAST TECHNIQUE: Multidetector CT imaging of the pelvis was performed following the installation of 50 cc Omnipaque 300 diluted in 450 cc of saline through the indwelling Foley catheter. No intravenous contrast was administered. COMPARISON:  Noncontrast CT 09/19/2017 and 09/24/2017. FINDINGS: Urinary Tract: There is moderate anterior extravasation of contrast from the bladder into the prevesical fat. Contrast extends along the anterior abdominal wall, asymmetric to the left, admixed with extraluminal air. Contrast also extends into the prostate gland, asymmetric to the right. No contrast extension into the peritoneal cavity is seen. A Foley catheter is in place. There is mild bladder wall thickening and trabeculation. The distal ureters do not appear dilated. Bowel: Multiple borderline dilated loops of small bowel with air-fluid levels are again noted, most consistent with an ileus. There is a rectal anastomosis. The visualized colon and appendix otherwise appear normal. Vascular/Lymphatic: No enlarged pelvic lymph nodes identified. Extensive aortic and branch vessel atherosclerosis. Postsurgical changes in both groins from previous femoral-femoral bypass graft. Reproductive: Extravasated bladder contrast extends into the substance of the prostate gland, asymmetric to the  right. The seminal vesicles appear normal. Other: No free air or peritoneal contrast. Musculoskeletal: No evidence of pelvic fracture. Postsurgical changes in the lower lumbar spine and sacroiliac degenerative changes are noted. IMPRESSION: 1. CT cystogram confirms the presence of extraperitoneal bladder rupture with extravasation of contrast into the prevesical fat and prostate gland. No peritoneal contrast demonstrated. 2. No evidence of underlying pelvic fracture. 3. Probable small bowel ileus. 4.  Aortic Atherosclerosis (ICD10-I70.0). Electronically Signed   By: Richardean Sale M.D.   On:  09/24/2017 16:49   Dg Chest Port 1 View  Result Date: 09/21/2017 CLINICAL DATA:  Dyspnea. EXAM: PORTABLE CHEST 1 VIEW COMPARISON:  05/04/2017 and prior studies. FINDINGS: Cardiomediastinal silhouette is normal. Mediastinal contours appear intact. Calcific atherosclerotic disease of the aorta. There is no evidence of pleural effusion or pneumothorax. Low lung volumes. Multiple tiny hyperdense nodules overlie bilateral lung bases, right greater than left. Osseous structures are without acute abnormality. Soft tissues are grossly normal. IMPRESSION: Multiple tiny hyperdense nodules overlie bilateral lung bases, right greater than left. These may represent pleural calcifications, pleuroparenchymal scarring or high density nodules along the bronchovascular tree of the lower lobes. Calcific atherosclerotic disease and tortuosity of the aorta. Electronically Signed   By: Fidela Salisbury M.D.   On: 09/21/2017 11:16   Dg Abd Acute W/chest  Result Date: 09/23/2017 CLINICAL DATA:  Abdominal distension EXAM: DG ABDOMEN ACUTE W/ 1V CHEST COMPARISON:  09/21/2017, CT 09/19/2017 FINDINGS: Single-view chest demonstrates mild basilar atelectasis. Stable cardiomediastinal silhouette with aortic atherosclerosis. Tiny calcified nodules at the lung bases as before. Supine and upright views of the abdomen demonstrate no free air beneath  the diaphragm. Development of multiple loops of dilated air-filled small bowel measuring up to 3.1 cm. Postsurgical changes in the pelvis. Right iliac stent. IMPRESSION: 1. No radiographic evidence for acute cardiopulmonary abnormality. 2. Development of multiple air-filled dilated loops of small bowel with some distal gas present, findings could be secondary to an ileus, however partial small bowel obstruction could also produce this appearance. Electronically Signed   By: Donavan Foil M.D.   On: 09/23/2017 01:39   Dg Abd Portable 1v-small Bowel Obstruction Protocol-initial, 8 Hr Delay  Result Date: 09/28/2017 CLINICAL DATA:  Small bowel obstruction, continued surveillance. EXAM: PORTABLE ABDOMEN - 1 VIEW COMPARISON:  09/26/2017.  09/27/2017. FINDINGS: Enteric contrast is now seen in the RIGHT colon, indicating a degree of improvement. Enteric contrast remains within mildly prominent small bowel loops indicating a degree of partial obstruction. NG tube tip in the stomach. Spinal fixation device spans the L4 and L5 segments. IMPRESSION: Slight improvement partial SBO. Electronically Signed   By: Staci Righter M.D.   On: 09/28/2017 07:27   Dg Abd Portable 1v  Result Date: 09/24/2017 CLINICAL DATA:  NG tube placement EXAM: PORTABLE ABDOMEN - 1 VIEW COMPARISON:  09/23/2017, CT 09/19/2017 FINDINGS: Lung bases are clear. Esophageal tube tip overlies the proximal stomach. Persistent gaseous enlargement of small bowel up to 3.2 cm. IMPRESSION: 1. Esophageal tube tip overlies the proximal stomach 2. Persistent gaseous enlargement of small bowel, suspect for bowel obstruction Electronically Signed   By: Donavan Foil M.D.   On: 09/24/2017 01:46   Dg Abd Portable 2v  Result Date: 09/27/2017 CLINICAL DATA:  Ileus EXAM: PORTABLE ABDOMEN - 2 VIEW COMPARISON:  CT abdomen and pelvis September 24, 2017 and abdominal radiographs September 26, 2017 FINDINGS: Nasogastric tube tip and side port are in the proximal stomach. There is  persistent relatively mild dilatation of multiple loops of small bowel without air-fluid levels. No free air. There is moderate stool in the colon. Lung bases are clear. There is postoperative change in the pelvis and lower lumbar spine regions. IMPRESSION: Nasogastric tube tip and side port in proximal stomach. Persistent loops of dilated small bowel, likely due to ileus or enteritis. Bowel obstruction felt to be less likely. No free air. Areas of postoperative change noted. Electronically Signed   By: Lowella Grip III M.D.   On: 09/27/2017 07:18   Dg Abd  Portable 2v  Result Date: 09/26/2017 CLINICAL DATA:  83 year old male with recently diagnosed extraperitoneal bladder rupture. Ileus. EXAM: PORTABLE ABDOMEN - 2 VIEW COMPARISON:  CT Abdomen and Pelvis 09/24/2017 and earlier. FINDINGS: Portable AP supine and left lateral decubitus views at 1421 hours. No pneumoperitoneum. Enteric tube in place with side hole at the level of the gastric cardia. Negative lung bases. Mildly improved bowel gas pattern since 09/24/2017 but multiple gas-filled small bowel loops remain at the upper limits of normal to mildly dilated. There is large bowel gas present. Lower lumbar interspinous implant and pelvic surgical clips redemonstrated. No acute osseous abnormality identified. IMPRESSION: 1. Mildly improved bowel gas pattern since 09/24/2017. Multiple gas-filled small bowel loops remain compatible with a degree of residual ileus or partial obstruction. 2. No free air. Electronically Signed   By: Genevie Ann M.D.   On: 09/26/2017 15:07   Ct Renal Stone Study  Result Date: 09/19/2017 CLINICAL DATA:  Gross hematuria.  Urinary retention. EXAM: CT ABDOMEN AND PELVIS WITHOUT CONTRAST TECHNIQUE: Multidetector CT imaging of the abdomen and pelvis was performed following the standard protocol without IV contrast. COMPARISON:  None. FINDINGS: Lower chest: No acute abnormality. Hepatobiliary: No focal liver abnormality is seen. No  gallstones, gallbladder wall thickening, or biliary dilatation. Pancreas: Unremarkable. No pancreatic ductal dilatation or surrounding inflammatory changes. Spleen: Normal in size without focal abnormality. Adrenals/Urinary Tract: Adrenal glands appear normal. Kidneys appear normal. No hydronephrosis or renal obstruction is noted. Mild diffuse wall thickening of urinary bladder is noted with small diverticulum seen superiorly. Blood is noted in the dependent portion of the urinary bladder lumen. Stomach/Bowel: Stomach is within normal limits. Appendix appears normal. No evidence of bowel wall thickening, distention, or inflammatory changes. Vascular/Lymphatic: Aortic atherosclerosis. No enlarged abdominal or pelvic lymph nodes. Reproductive: Prostate is unremarkable. Other: No abdominal wall hernia or abnormality. No abdominopelvic ascites. Musculoskeletal: Postsurgical and degenerative changes are noted in the lower lumbar spine. No acute abnormality is noted. IMPRESSION: No renal or ureteral calculi are noted.  No hydronephrosis is noted. Mild diffuse urinary bladder wall thickening is noted concerning for cystitis. Small bladder diverticulum is noted superiorly. Probable hemorrhage is noted within the urinary bladder lumen; cystoscopy is recommended to rule out possible neoplasm or malignancy. Aortic Atherosclerosis (ICD10-I70.0). Electronically Signed   By: Marijo Conception, M.D.   On: 09/19/2017 15:08    Lab Data:  CBC: Recent Labs  Lab 09/24/17 0421 09/25/17 0421 09/27/17 3383 09/28/17 2919 09/29/17 0415  WBC 15.4* 7.7 7.3 8.4 9.2  HGB 13.9 12.4* 11.2* 11.4* 10.2*  HCT 43.3 38.2* 34.7* 36.9* 32.9*  MCV 87.7 87.0 88.5 91.8 89.6  PLT 334 343 294 321 166   Basic Metabolic Panel: Recent Labs  Lab 09/25/17 0421 09/25/17 1322 09/26/17 1537 09/27/17 0438 09/28/17 0632 09/29/17 0415  NA 138  --  139 136 136 137  K 3.6  --  3.7 3.3* 3.9 4.0  CL 106  --  98 96* 100 105  CO2 25  --  32 31 28  27   GLUCOSE 174*  --  156* 164* 125* 123*  BUN 15  --  12 10 10 10   CREATININE 0.71  --  0.69 0.68 0.73 0.73  CALCIUM 8.1*  --  8.1* 7.7* 8.1* 7.9*  MG  --  2.1  --  2.0  --   --    GFR: Estimated Creatinine Clearance: 79.2 mL/min (by C-G formula based on SCr of 0.73 mg/dL). Liver Function Tests: Recent Labs  Lab 09/29/17 0415  AST 32  ALT 18  ALKPHOS 47  BILITOT 0.9  PROT 5.3*  ALBUMIN 2.2*   No results for input(s): LIPASE, AMYLASE in the last 168 hours. No results for input(s): AMMONIA in the last 168 hours. Coagulation Profile: No results for input(s): INR, PROTIME in the last 168 hours. Cardiac Enzymes: No results for input(s): CKTOTAL, CKMB, CKMBINDEX, TROPONINI in the last 168 hours. BNP (last 3 results) No results for input(s): PROBNP in the last 8760 hours. HbA1C: No results for input(s): HGBA1C in the last 72 hours. CBG: Recent Labs  Lab 09/28/17 2010 09/29/17 0017 09/29/17 0340 09/29/17 0805 09/29/17 1217  GLUCAP 131* 131* 124* 142* 135*   Lipid Profile: No results for input(s): CHOL, HDL, LDLCALC, TRIG, CHOLHDL, LDLDIRECT in the last 72 hours. Thyroid Function Tests: No results for input(s): TSH, T4TOTAL, FREET4, T3FREE, THYROIDAB in the last 72 hours. Anemia Panel: No results for input(s): VITAMINB12, FOLATE, FERRITIN, TIBC, IRON, RETICCTPCT in the last 72 hours. Urine analysis:    Component Value Date/Time   COLORURINE RED (A) 09/19/2017 1456   APPEARANCEUR HAZY (A) 09/19/2017 1456   LABSPEC 1.009 09/19/2017 1456   PHURINE 6.0 09/19/2017 1456   GLUCOSEU NEGATIVE 09/19/2017 1456   HGBUR LARGE (A) 09/19/2017 1456   BILIRUBINUR NEGATIVE 09/19/2017 1456   BILIRUBINUR negative 05/23/2017 0855   KETONESUR NEGATIVE 09/19/2017 1456   PROTEINUR 100 (A) 09/19/2017 1456   UROBILINOGEN 0.2 05/23/2017 0855   UROBILINOGEN 1.0 12/10/2013 1456   NITRITE NEGATIVE 09/19/2017 1456   LEUKOCYTESUR MODERATE (A) 09/19/2017 1456     Amelia Macken M.D. [facility] 09/29/2017, 12:26 PM  Pager: [phone] Between 7am to 7pm - call Pager - [phone]  After 7pm go to [email or web address] - password [email or web address]  Call night coverage person covering after 7pm

## 2017-09-29 NOTE — Progress Notes (Signed)
Patient's family is waiting for MD to decide if he will need NGT or will hold it for a while. Will continue to monitor.

## 2017-09-30 DIAGNOSIS — G301 Alzheimer's disease with late onset: Secondary | ICD-10-CM

## 2017-09-30 LAB — BASIC METABOLIC PANEL
Anion gap: 6 (ref 5–15)
BUN: 6 mg/dL — AB (ref 8–23)
CO2: 25 mmol/L (ref 22–32)
Calcium: 8 mg/dL — ABNORMAL LOW (ref 8.9–10.3)
Chloride: 106 mmol/L (ref 98–111)
Creatinine, Ser: 0.75 mg/dL (ref 0.61–1.24)
GFR calc Af Amer: >60 mL/min (ref >60)
GLUCOSE: 117 mg/dL — AB (ref 70–99)
POTASSIUM: 3.7 mmol/L (ref 3.5–5.1)
Sodium: 137 mmol/L (ref 135–145)

## 2017-09-30 LAB — GLUCOSE, CAPILLARY
Glucose-Capillary: 114 mg/dL — ABNORMAL HIGH (ref 70–99)
Glucose-Capillary: 105 mg/dL — ABNORMAL HIGH (ref 70–99)
Glucose-Capillary: 149 mg/dL — ABNORMAL HIGH (ref 70–99)
Glucose-Capillary: 151 mg/dL — ABNORMAL HIGH (ref 70–99)
Glucose-Capillary: 133 mg/dL — ABNORMAL HIGH (ref 70–99)
Glucose-Capillary: 150 mg/dL — ABNORMAL HIGH (ref 70–99)

## 2017-09-30 MED ORDER — SODIUM CHLORIDE 0.9 % IV SOLN
INTRAVENOUS | Status: DC | PRN
  Administered 2017-09-30 – 2017-10-01 (×2): via INTRAVENOUS

## 2017-09-30 MED ORDER — SENNOSIDES-DOCUSATE SODIUM 8.6-50 MG PO TABS
1.0000 | ORAL_TABLET | Freq: Two times a day (BID) | ORAL | Status: DC
  Administered 2017-09-30 – 2017-10-02 (×4): 1 via ORAL
  Filled 2017-09-30 (×5): qty 1

## 2017-09-30 NOTE — Progress Notes (Addendum)
[facility]                                                                              Patient Demographics  [name], is a 83 y.o. male, DOB - 1934/09/26, HEN:[id number]  Admit date - 09/19/2017   Admitting Physician Cristy Folks, MD  Outpatient Primary MD for the patient is Glendale Chard, MD  Outpatient specialists:   LOS - 11  days   Medical records reviewed and are as summarized below:    Chief Complaint  Patient presents with  . Hematuria       Brief summary   83 year old African-American male with a past medical history of dementia, peripheral vascular disease status post right to left femoral-femoral graft, right iliac stents, chronic systolic CHF with EF of 20 to 25%, COPD, type 2 diabetes, history of colon cancer status post resection with hematuria.  Patient was seen by urology and was hospitalized.  He was started on continuous bladder irrigation.  Hematuria resolved.  Foley was removed.  Subsequently he developed ileus.  Assessment & Plan    Principal Problem:  Abdominal distention secondary to ileus -Patient was noted to have worsening abdominal distention on 7/1, NG tube was placed -CT abdomen showed worsening abdominal ileus with multiple air-fluid levels findings compatible with ileus -Serial abdominal x-rays were obtained, general surgery was also consulted. -On 7/7 overnight, patient pulled out his NGT and subsequently had 2 large bowel movements.  Alert and oriented and was started on clear liquid diet -will advance diet to dysphagia 3 diet, surgery signed off - Increase ambulation, PT Placed on Senokot-S-twice a day  Active problems Gross hematuria in the setting of Plavix, perforated urinary bladder -Initially appeared to have resolved, urology was consulted.  Patient underwent CBI. -Foley catheter was discontinued on 6/29, subsequently had multiple episodes of urinary retention on 7/1, Foley catheter was reinserted. -Given  patient continued to have abdominal pain, CT abdomen and pelvis was obtained which showed perforated urinary bladder -CT cystogram confirmed the presence of extraperitoneal bladder rupture with extravasation of contrast into the perivesical fat the prostate gland  -Urology following, continue Foley catheter, remain indwelling for 2 weeks, continue antibiotics while on catheter awaiting the healing of bladder rupture.  Patient will need cystoscopy and cystogram outpatient. -Patient is stable    Acute kidney injury with metabolic acidosis -Baseline creatinine around 1.1, 1.48 at the time of admission, plateaued at 2.09 -Creatinine at baseline, DC IV fluids   Leukocytosis -Likely due to #1 and #2, continue IV Rocephin, improving -Cultures negative so far  Acute metabolic encephalopathy on dementia likely due to acute illness -Much more alert and oriented, close to his baseline, confirmed by his daughter at the bedside  History of PVD -Plavix currently held due to hematuria.   - will restart Plavix at the time of discharge.  Chronic systolic CHF -EF 20 to 36% per 2D echo in 08/2013 -DC IV fluids  Accelerated hypertension -BP currently stable, continue IV hydralazine, metoprolol, labetalol as needed  -Will place on oral antihypertensives once patient is consistently tolerating diet.   Diabetes mellitus type 2, NIDDM -IV fluids discontinued,  observe CBGs on dysphagia 3 diet  COPD -No wheezing  Code Status: DNR DVT Prophylaxis:   SCD's Family Communication: Discussed in detail with patient's wife   Disposition Plan: Possible DC next 24 to 48 hours Time Spent in minutes   25 minutes  Procedures:  None  Consultants:   Urology Palliative medicine General surgery  Antimicrobials:   IV Rocephin   Medications  Scheduled Meds: . chlorhexidine  15 mL Mouth Rinse BID  . fluticasone furoate-vilanterol  1 puff Inhalation Daily  . hydrALAZINE  10 mg Intravenous Q8H  .  insulin aspart  0-15 Units Subcutaneous TID WC  . insulin aspart  0-5 Units Subcutaneous QHS  . mouth rinse  15 mL Mouth Rinse q12n4p  . metoprolol tartrate  2.5 mg Intravenous Q6H  . senna-docusate  1 tablet Oral BID   Continuous Infusions: . cefTRIAXone (ROCEPHIN)  IV 1 g (09/29/17 1408)  . chlorproMAZINE (THORAZINE) IV 25 mg (09/28/17 1712)  . dextrose 5 % and 0.45 % NaCl with KCl 20 mEq/L 50 mL/hr at 09/30/17 1232  . famotidine (PEPCID) IV 20 mg (09/29/17 1451)   PRN Meds:.acetaminophen **OR** acetaminophen, chlorproMAZINE (THORAZINE) IV, labetalol, ondansetron **OR** ondansetron (ZOFRAN) IV, RESOURCE THICKENUP CLEAR   Antibiotics   Anti-infectives (From admission, onward)   Start     Dose/Rate Route Frequency Ordered Stop   09/26/17 1400  cefTRIAXone (ROCEPHIN) 1 g in sodium chloride 0.9 % 100 mL IVPB     1 g 200 mL/hr over 30 Minutes Intravenous Every 24 hours 09/26/17 1358     09/25/17 1345  cefTRIAXone (ROCEPHIN) 1 g in sodium chloride 0.9 % 100 mL IVPB  Status:  Discontinued     1 g 200 mL/hr over 30 Minutes Intravenous Every 24 hours 09/25/17 1334 09/25/17 1340   09/20/17 1200  cefTRIAXone (ROCEPHIN) 1 g in sodium chloride 0.9 % 100 mL IVPB     1 g 200 mL/hr over 30 Minutes Intravenous Every 24 hours 09/19/17 2009 09/25/17 1405   09/19/17 1530  cefTRIAXone (ROCEPHIN) 1 g in sodium chloride 0.9 % 100 mL IVPB     1 g 200 mL/hr over 30 Minutes Intravenous  Once 09/19/17 1518 09/19/17 1557   09/19/17 1300  cefTRIAXone (ROCEPHIN) 1 g in sodium chloride 0.9 % 100 mL IVPB  Status:  Discontinued     1 g 200 mL/hr over 30 Minutes Intravenous  Once 09/19/17 1258 09/19/17 1259        Subjective:   [name] was seen and examined today.  Alert and awake, close to his baseline.  Tolerating clear liquid diet.  No hiccups fevers or chills.  No hematuria   Objective:   Vitals:   09/30/17 0330 09/30/17 0500 09/30/17 0751 09/30/17 0826  BP: (!) 158/75   (!) 156/60  Pulse:  84  83 86  Resp: 20  18 17   Temp: 98.6 F (37 C)   98.2 F (36.8 C)  TempSrc: Oral   Oral  SpO2: 99%  96% 92%  Weight:  75.2 kg (165 lb 12.6 oz)    Height:        Intake/Output Summary (Last 24 hours) at 09/30/2017 1237 Last data filed at 09/30/2017 0825 Gross per 24 hour  Intake 1059.89 ml  Output 1850 ml  Net -790.11 ml     Wt Readings from Last 3 Encounters:  09/30/17 75.2 kg (165 lb 12.6 oz)  09/17/17 69.9 kg (154 lb)  08/05/17 70.3 kg (155 lb)  Exam   General: Alert and oriented  NAD  Eyes  HEENT:    Cardiovascular: S1 S2 auscultated, no rubs, murmurs or gallops. Regular rate and rhythm. No pedal edema b/l  Respiratory: Clear to auscultation bilaterally, no wheezing, rales or rhonchi  Gastrointestinal: Soft, nontender, nondistended, + bowel sounds  Ext: no pedal edema bilaterally  Neuro: no new deficits  Musculoskeletal: No digital cyanosis, clubbing  Skin: No rashes Psych: appears close to baseline    Data Reviewed:  I have personally reviewed following labs and imaging studies  Micro Results No results found for this or any previous visit (from the past 240 hour(s)).  Radiology Reports Ct Abdomen Pelvis Wo Contrast  Result Date: 09/24/2017 CLINICAL DATA:  83 year old male with history of potential bowel obstruction. EXAM: CT ABDOMEN AND PELVIS WITHOUT CONTRAST TECHNIQUE: Multidetector CT imaging of the abdomen and pelvis was performed following the standard protocol without IV contrast. COMPARISON:  CT of the abdomen and pelvis 09/19/2017. FINDINGS: Lower chest: Emphysematous changes. Small right pleural effusion lying dependently. Atherosclerotic calcifications in the descending thoracic aorta as well as the left anterior descending and right coronary arteries. Nasogastric tube in the esophagus. Small hiatal hernia. Hepatobiliary: No definite cystic or solid hepatic lesions are confidently identified on today's noncontrast CT examination. Unenhanced  appearance of the gallbladder is unremarkable. Pancreas: No definite pancreatic mass or peripancreatic fluid collections or inflammatory changes are noted on today's noncontrast CT examination. Spleen: Unremarkable. Adrenals/Urinary Tract: Unenhanced appearance of the adrenal glands and right kidney is unremarkable. 2 cm exophytic low-attenuation lesion extending off the medial aspect of the lower pole of the left kidney, incompletely characterized on today's noncontrast CT examination, but similar to prior studies, likely a tiny cyst. No hydroureteronephrosis. Urinary bladder is nearly decompressed around an indwelling Foley balloon catheter. There appears to be combination of intramural gas in the wall of the urinary bladder, as well as some extraluminal gas adjacent to the urinary bladder both anteriorly (in the space of Retzius) and superiorly. This is best appreciated on axial images 62-70 and sagittal images 54-66. Inflammatory changes are also noted throughout the adjacent soft tissues where there is also small volume of intermediate attenuation fluid, which appears to be predominantly located in the space of Retzius. Stomach/Bowel: Nasogastric tube extends into the stomach. There multiple prominent borderline dilated loops of small bowel with multiple air-fluid levels. No pathologic dilatation of colon. Gas and stool is noted throughout the colon extending to the level of the rectum. Suture line in the rectum indicative of prior partial proctocolectomy. Normal appendix. Vascular/Lymphatic: Aortic atherosclerosis. Fem-fem bypass graft incompletely evaluated on today's noncontrast CT examination. No lymphadenopathy noted in the abdomen or pelvis. Reproductive: Prostate gland and seminal vesicles are unremarkable in appearance. Other: Small volume of ascites. Trace volume of pneumoperitoneum adjacent to the urinary bladder, as discussed above. Musculoskeletal: Posterior orthopedic fixation hardware in place at  L4-L5. Interbody graft at L4-L5. 13 mm of anterolisthesis of L4 upon L5. There are no aggressive appearing lytic or blastic lesions noted in the visualized portions of the skeleton. IMPRESSION: 1. Findings are highly concerning for perforated urinary bladder. This includes both intramural gas, extraluminal gas adjacent to the urinary bladder, and gas and fluid in the space of Retzius. 2. Small volume of ascites or urine noted in the peritoneal cavity. 3. Multiple prominent borderline dilated loops of small bowel with multiple air-fluid levels. Findings are not compatible with obstruction, but may reflect a mild ileus. 4. Aortic atherosclerosis, in addition to  at least 2 vessel coronary artery disease. 5. Small right pleural effusion lying dependently. 6. Emphysema. 7. Additional incidental findings, as above. These results will be called to the ordering clinician or representative by the Radiologist Assistant, and communication documented in the PACS or zVision Dashboard. Aortic Atherosclerosis (ICD10-I70.0) and Emphysema (ICD10-J43.9). Electronically Signed   By: Vinnie Langton M.D.   On: 09/24/2017 12:50   Ct Pelvis Wo Contrast  Result Date: 09/24/2017 CLINICAL DATA:  Suspected perforation of the urinary bladder on earlier noncontrast CT. Recent gross hematuria. History of diabetes, peripheral vascular disease and dementia. EXAM: CT PELVIS WITHOUT CONTRAST TECHNIQUE: Multidetector CT imaging of the pelvis was performed following the installation of 50 cc Omnipaque 300 diluted in 450 cc of saline through the indwelling Foley catheter. No intravenous contrast was administered. COMPARISON:  Noncontrast CT 09/19/2017 and 09/24/2017. FINDINGS: Urinary Tract: There is moderate anterior extravasation of contrast from the bladder into the prevesical fat. Contrast extends along the anterior abdominal wall, asymmetric to the left, admixed with extraluminal air. Contrast also extends into the prostate gland, asymmetric  to the right. No contrast extension into the peritoneal cavity is seen. A Foley catheter is in place. There is mild bladder wall thickening and trabeculation. The distal ureters do not appear dilated. Bowel: Multiple borderline dilated loops of small bowel with air-fluid levels are again noted, most consistent with an ileus. There is a rectal anastomosis. The visualized colon and appendix otherwise appear normal. Vascular/Lymphatic: No enlarged pelvic lymph nodes identified. Extensive aortic and branch vessel atherosclerosis. Postsurgical changes in both groins from previous femoral-femoral bypass graft. Reproductive: Extravasated bladder contrast extends into the substance of the prostate gland, asymmetric to the right. The seminal vesicles appear normal. Other: No free air or peritoneal contrast. Musculoskeletal: No evidence of pelvic fracture. Postsurgical changes in the lower lumbar spine and sacroiliac degenerative changes are noted. IMPRESSION: 1. CT cystogram confirms the presence of extraperitoneal bladder rupture with extravasation of contrast into the prevesical fat and prostate gland. No peritoneal contrast demonstrated. 2. No evidence of underlying pelvic fracture. 3. Probable small bowel ileus. 4.  Aortic Atherosclerosis (ICD10-I70.0). Electronically Signed   By: Richardean Sale M.D.   On: 09/24/2017 16:49   Dg Chest Port 1 View  Result Date: 09/21/2017 CLINICAL DATA:  Dyspnea. EXAM: PORTABLE CHEST 1 VIEW COMPARISON:  05/04/2017 and prior studies. FINDINGS: Cardiomediastinal silhouette is normal. Mediastinal contours appear intact. Calcific atherosclerotic disease of the aorta. There is no evidence of pleural effusion or pneumothorax. Low lung volumes. Multiple tiny hyperdense nodules overlie bilateral lung bases, right greater than left. Osseous structures are without acute abnormality. Soft tissues are grossly normal. IMPRESSION: Multiple tiny hyperdense nodules overlie bilateral lung bases, right  greater than left. These may represent pleural calcifications, pleuroparenchymal scarring or high density nodules along the bronchovascular tree of the lower lobes. Calcific atherosclerotic disease and tortuosity of the aorta. Electronically Signed   By: Fidela Salisbury M.D.   On: 09/21/2017 11:16   Dg Abd Acute W/chest  Result Date: 09/23/2017 CLINICAL DATA:  Abdominal distension EXAM: DG ABDOMEN ACUTE W/ 1V CHEST COMPARISON:  09/21/2017, CT 09/19/2017 FINDINGS: Single-view chest demonstrates mild basilar atelectasis. Stable cardiomediastinal silhouette with aortic atherosclerosis. Tiny calcified nodules at the lung bases as before. Supine and upright views of the abdomen demonstrate no free air beneath the diaphragm. Development of multiple loops of dilated air-filled small bowel measuring up to 3.1 cm. Postsurgical changes in the pelvis. Right iliac stent. IMPRESSION: 1. No radiographic evidence for  acute cardiopulmonary abnormality. 2. Development of multiple air-filled dilated loops of small bowel with some distal gas present, findings could be secondary to an ileus, however partial small bowel obstruction could also produce this appearance. Electronically Signed   By: Donavan Foil M.D.   On: 09/23/2017 01:39   Dg Abd Portable 1v-small Bowel Obstruction Protocol-initial, 8 Hr Delay  Result Date: 09/28/2017 CLINICAL DATA:  Small bowel obstruction, continued surveillance. EXAM: PORTABLE ABDOMEN - 1 VIEW COMPARISON:  09/26/2017.  09/27/2017. FINDINGS: Enteric contrast is now seen in the RIGHT colon, indicating a degree of improvement. Enteric contrast remains within mildly prominent small bowel loops indicating a degree of partial obstruction. NG tube tip in the stomach. Spinal fixation device spans the L4 and L5 segments. IMPRESSION: Slight improvement partial SBO. Electronically Signed   By: Staci Righter M.D.   On: 09/28/2017 07:27   Dg Abd Portable 1v  Result Date: 09/24/2017 CLINICAL DATA:  NG  tube placement EXAM: PORTABLE ABDOMEN - 1 VIEW COMPARISON:  09/23/2017, CT 09/19/2017 FINDINGS: Lung bases are clear. Esophageal tube tip overlies the proximal stomach. Persistent gaseous enlargement of small bowel up to 3.2 cm. IMPRESSION: 1. Esophageal tube tip overlies the proximal stomach 2. Persistent gaseous enlargement of small bowel, suspect for bowel obstruction Electronically Signed   By: Donavan Foil M.D.   On: 09/24/2017 01:46   Dg Abd Portable 2v  Result Date: 09/27/2017 CLINICAL DATA:  Ileus EXAM: PORTABLE ABDOMEN - 2 VIEW COMPARISON:  CT abdomen and pelvis September 24, 2017 and abdominal radiographs September 26, 2017 FINDINGS: Nasogastric tube tip and side port are in the proximal stomach. There is persistent relatively mild dilatation of multiple loops of small bowel without air-fluid levels. No free air. There is moderate stool in the colon. Lung bases are clear. There is postoperative change in the pelvis and lower lumbar spine regions. IMPRESSION: Nasogastric tube tip and side port in proximal stomach. Persistent loops of dilated small bowel, likely due to ileus or enteritis. Bowel obstruction felt to be less likely. No free air. Areas of postoperative change noted. Electronically Signed   By: Lowella Grip III M.D.   On: 09/27/2017 07:18   Dg Abd Portable 2v  Result Date: 09/26/2017 CLINICAL DATA:  83 year old male with recently diagnosed extraperitoneal bladder rupture. Ileus. EXAM: PORTABLE ABDOMEN - 2 VIEW COMPARISON:  CT Abdomen and Pelvis 09/24/2017 and earlier. FINDINGS: Portable AP supine and left lateral decubitus views at 1421 hours. No pneumoperitoneum. Enteric tube in place with side hole at the level of the gastric cardia. Negative lung bases. Mildly improved bowel gas pattern since 09/24/2017 but multiple gas-filled small bowel loops remain at the upper limits of normal to mildly dilated. There is large bowel gas present. Lower lumbar interspinous implant and pelvic surgical clips  redemonstrated. No acute osseous abnormality identified. IMPRESSION: 1. Mildly improved bowel gas pattern since 09/24/2017. Multiple gas-filled small bowel loops remain compatible with a degree of residual ileus or partial obstruction. 2. No free air. Electronically Signed   By: Genevie Ann M.D.   On: 09/26/2017 15:07   Ct Renal Stone Study  Result Date: 09/19/2017 CLINICAL DATA:  Gross hematuria.  Urinary retention. EXAM: CT ABDOMEN AND PELVIS WITHOUT CONTRAST TECHNIQUE: Multidetector CT imaging of the abdomen and pelvis was performed following the standard protocol without IV contrast. COMPARISON:  None. FINDINGS: Lower chest: No acute abnormality. Hepatobiliary: No focal liver abnormality is seen. No gallstones, gallbladder wall thickening, or biliary dilatation. Pancreas: Unremarkable. No pancreatic ductal dilatation  or surrounding inflammatory changes. Spleen: Normal in size without focal abnormality. Adrenals/Urinary Tract: Adrenal glands appear normal. Kidneys appear normal. No hydronephrosis or renal obstruction is noted. Mild diffuse wall thickening of urinary bladder is noted with small diverticulum seen superiorly. Blood is noted in the dependent portion of the urinary bladder lumen. Stomach/Bowel: Stomach is within normal limits. Appendix appears normal. No evidence of bowel wall thickening, distention, or inflammatory changes. Vascular/Lymphatic: Aortic atherosclerosis. No enlarged abdominal or pelvic lymph nodes. Reproductive: Prostate is unremarkable. Other: No abdominal wall hernia or abnormality. No abdominopelvic ascites. Musculoskeletal: Postsurgical and degenerative changes are noted in the lower lumbar spine. No acute abnormality is noted. IMPRESSION: No renal or ureteral calculi are noted.  No hydronephrosis is noted. Mild diffuse urinary bladder wall thickening is noted concerning for cystitis. Small bladder diverticulum is noted superiorly. Probable hemorrhage is noted within the urinary  bladder lumen; cystoscopy is recommended to rule out possible neoplasm or malignancy. Aortic Atherosclerosis (ICD10-I70.0). Electronically Signed   By: Marijo Conception, M.D.   On: 09/19/2017 15:08    Lab Data:  CBC: Recent Labs  Lab 09/24/17 0421 09/25/17 0421 09/27/17 0438 09/28/17 8850 09/29/17 0415  WBC 15.4* 7.7 7.3 8.4 9.2  HGB 13.9 12.4* 11.2* 11.4* 10.2*  HCT 43.3 38.2* 34.7* 36.9* 32.9*  MCV 87.7 87.0 88.5 91.8 89.6  PLT 334 343 294 321 277   Basic Metabolic Panel: Recent Labs  Lab 09/25/17 1322 09/26/17 1537 09/27/17 0438 09/28/17 0632 09/29/17 0415 09/30/17 0445  NA  --  139 136 136 137 137  K  --  3.7 3.3* 3.9 4.0 3.7  CL  --  98 96* 100 105 106  CO2  --  32 31 28 27 25   GLUCOSE  --  156* 164* 125* 123* 117*  BUN  --  12 10 10 10  6*  CREATININE  --  0.69 0.68 0.73 0.73 0.75  CALCIUM  --  8.1* 7.7* 8.1* 7.9* 8.0*  MG 2.1  --  2.0  --   --   --    GFR: Estimated Creatinine Clearance: 78.3 mL/min (by C-G formula based on SCr of 0.75 mg/dL). Liver Function Tests: Recent Labs  Lab 09/29/17 0415  AST 32  ALT 18  ALKPHOS 47  BILITOT 0.9  PROT 5.3*  ALBUMIN 2.2*   No results for input(s): LIPASE, AMYLASE in the last 168 hours. No results for input(s): AMMONIA in the last 168 hours. Coagulation Profile: No results for input(s): INR, PROTIME in the last 168 hours. Cardiac Enzymes: No results for input(s): CKTOTAL, CKMB, CKMBINDEX, TROPONINI in the last 168 hours. BNP (last 3 results) No results for input(s): PROBNP in the last 8760 hours. HbA1C: No results for input(s): HGBA1C in the last 72 hours. CBG: Recent Labs  Lab 09/29/17 1931 09/29/17 2336 09/30/17 0326 09/30/17 0818 09/30/17 1230  GLUCAP 112* 113* 114* 105* 149*   Lipid Profile: No results for input(s): CHOL, HDL, LDLCALC, TRIG, CHOLHDL, LDLDIRECT in the last 72 hours. Thyroid Function Tests: No results for input(s): TSH, T4TOTAL, FREET4, T3FREE, THYROIDAB in the last 72  hours. Anemia Panel: No results for input(s): VITAMINB12, FOLATE, FERRITIN, TIBC, IRON, RETICCTPCT in the last 72 hours. Urine analysis:    Component Value Date/Time   COLORURINE RED (A) 09/19/2017 1456   APPEARANCEUR HAZY (A) 09/19/2017 1456   LABSPEC 1.009 09/19/2017 1456   PHURINE 6.0 09/19/2017 1456   GLUCOSEU NEGATIVE 09/19/2017 1456   HGBUR LARGE (A) 09/19/2017 1456   BILIRUBINUR  NEGATIVE 09/19/2017 1456   BILIRUBINUR negative 05/23/2017 0855   KETONESUR NEGATIVE 09/19/2017 1456   PROTEINUR 100 (A) 09/19/2017 1456   UROBILINOGEN 0.2 05/23/2017 0855   UROBILINOGEN 1.0 12/10/2013 1456   NITRITE NEGATIVE 09/19/2017 1456   LEUKOCYTESUR MODERATE (A) 09/19/2017 1456     Arleth Mccullar M.D. [facility] 09/30/2017, 12:37 PM  Pager: [phone] Between 7am to 7pm - call Pager - [phone]  After 7pm go to [email or web address] - password [email or web address]  Call night coverage person covering after 7pm

## 2017-09-30 NOTE — Progress Notes (Signed)
Patient ID: MUSA REWERTS, male   DOB: [birth year], 83 y.o.   MRN: [id number]    Assessment: 1. Gross hematuria: His urine remains clear.    2.  Extraperitoneal bladder rupture: No SP tenderness and clear urine with no fever as well as overall clinical improvement.   Plan:  1.  Continue Foley catheter drainage. 2.  Follow-up as an outpatient.  Subjective: Patient appears to be more awake, alert and conversant.  Objective: Vital signs in last 24 hours: Temp:  [98.6 F (37 C)-99.5 F (37.5 C)] 98.6 F (37 C) (07/08 0330) Pulse Rate:  [84-98] 84 (07/08 0330) Resp:  [20-22] 20 (07/08 0330) BP: (129-158)/(64-78) 158/75 (07/08 0330) SpO2:  [94 %-99 %] 99 % (07/08 0330) Weight:  [75.2 kg (165 lb 12.6 oz)] 75.2 kg (165 lb 12.6 oz) (07/08 0500)A  Intake/Output from previous day: 07/07 0701 - 07/08 0700 In: 1059.9 [P.O.:120; I.V.:789.9; IV Piggyback:150] Out: 1600 [Urine:1600] Intake/Output this shift: No intake/output data recorded.  Past Medical History:  Diagnosis Date  . Alzheimer's dementia 2013  . Anemia    HX OF ANEMIA  . CHF (congestive heart failure) ([place])   . Colon cancer ([place])   . COPD (chronic obstructive pulmonary disease) ([place])   . Depression   . Dyspnea   . Hyperlipidemia   . Hypertension   . Pneumonia 2014  . PVD (peripheral vascular disease) ([place])   . Type II diabetes mellitus ([facility])     Physical Exam:  General: Awake, alert and in no apparent distress Lungs: Normal respiratory effort, chest expands symmetrically.  Abdomen: Soft, non-tender   Lab Results: Recent Labs    09/28/17 0632 09/29/17 0415  WBC 8.4 9.2  HGB 11.4* 10.2*  HCT 36.9* 32.9*   BMET Recent Labs    09/29/17 0415 09/30/17 0445  NA 137 137  K 4.0 3.7  CL 105 106  CO2 27 25  GLUCOSE 123* 117*  BUN 10 6*  CREATININE 0.73 0.75  CALCIUM 7.9* 8.0*   No results for input(s): LABURIN in the last 72 hours. Results for orders placed or performed during the hospital encounter  of 09/19/17  Culture, blood (routine x 2)     Status: None   Collection Time: 09/19/17 12:13 PM  Result Value Ref Range Status   Specimen Description BLOOD RIGHT ANTECUBITAL  Final   Special Requests   Final    BOTTLES DRAWN AEROBIC AND ANAEROBIC Blood Culture adequate volume   Culture   Final    NO GROWTH 5 DAYS Performed at [facility], [place] [address], [place], [place] [address]    Report Status 09/24/2017 FINAL  Final  Culture, blood (routine x 2)     Status: None   Collection Time: 09/19/17  1:30 PM  Result Value Ref Range Status   Specimen Description BLOOD LEFT ANTECUBITAL  Final   Special Requests   Final    BOTTLES DRAWN AEROBIC AND ANAEROBIC Blood Culture adequate volume   Culture   Final    NO GROWTH 5 DAYS Performed at [facility], [place] [address], [place], [place] [address]    Report Status 09/24/2017 FINAL  Final  Urine culture     Status: None   Collection Time: 09/19/17  2:56 PM  Result Value Ref Range Status   Specimen Description URINE, CLEAN CATCH  Final   Special Requests NONE  Final   Culture   Final    NO GROWTH Performed at [facility]  [facility], [address], [place], [place] [address]    Report Status 09/20/2017 FINAL  Final    Studies/Results: No results found.    Brandy Zuba C 09/30/2017, 7:07 AM

## 2017-09-30 NOTE — Progress Notes (Signed)
CSW re-submitted patient for SNF insurance review.   Edwin Locus Kimberleigh Mehan LCSW [phone]

## 2017-09-30 NOTE — Progress Notes (Signed)
  Speech Language Pathology Treatment: Dysphagia  Patient Details Name: [name] MRN: [id number] DOB: Apr 14, 1934 Today's Date: 09/30/2017 Time: 0076-2263 SLP Time Calculation (min) (ACUTE ONLY): 9 min  Assessment / Plan / Recommendation Clinical Impression  Pt continues to present with a baseline congested cough along with signs of a cognitively-based oral dysphagia, including oral holding that requires Mod cues to facilitate. SLP also provided Mod verbal/tactile cues for consumption of smaller sips of liquid at a time. With cueing in place, there does not appear to be an increase in frequency of baseline cough, and his voice remains clear throughout trials. He still remains at risk for aspiration particularly in light of mentation, making full supervision important. Would continue current diet with close monitoring for need to complete instrumental testing if in line with overall GOC. Wife not present this morning.     HPI HPI: 83 year old male admitted with gross hematuria, etiology unclear. Past medical history of dementia, peripheral vascular disease status post right to left femoral-femoral graft, right iliac stents, chronic systolic CHF, COPD, type 2 diabetes, history of colon cancer status post resection with hematuria. Assessed by SLP 12/11/13 with recommendation for dys 3/thin liquids. Referred for swallowing evaluation.      SLP Plan  Continue with current plan of care       Recommendations  Diet recommendations: Nectar-thick liquid Liquids provided via: Cup;No straw Medication Administration: Crushed with puree Supervision: Staff to assist with self feeding;Full supervision/cueing for compensatory strategies Compensations: Minimize environmental distractions;Slow rate;Small sips/bites Postural Changes and/or Swallow Maneuvers: Seated upright 90 degrees                Oral Care Recommendations: Oral care BID Follow up Recommendations: (tba) SLP Visit Diagnosis:  Dysphagia, unspecified (R13.10) Plan: Continue with current plan of care       GO                [name] 09/30/2017, 10:00 AM  [name], M.A. CCC-SLP [phone]

## 2017-09-30 NOTE — Progress Notes (Signed)
Nutrition Follow-up  DOCUMENTATION CODES:   Not applicable  INTERVENTION:  Cater to patient preferences Monitor for needs  NUTRITION DIAGNOSIS:   Inadequate oral intake related to altered GI function as evidenced by NPO status. -resolved  GOAL:   Patient will meet greater than or equal to 90% of their needs -progressing  MONITOR:   PO intake, Weight trends  REASON FOR ASSESSMENT:   Low Braden    ASSESSMENT:   [name] is a 83 y.o. male with medical history significant of dementia, peripheral vascular disease status post right to left femorofemoral graft, to right iliac stents with most recent peripheral arteriogram on 07/14/2017 showing 50% stenosis in between right stents and 50% stenosis at the origin of the right to left femorofemoral bypass, chronic systolic heart failure with EF of 20 to 25% by echo on 09/05/2013 with grade 2 diastolic dysfunction, COPD, type 2 diabetes on oral hypoglycemics, hyperlipidemia, history of colon cancer status post resection remote who comes in with hematuria.   Pulled out NGT overnight from 7/6-7/7 and had 2 large BMs on 7/7. AKI and hematuria resolved. 1.6L fluid negative, 1629mL UOP last shift  Spoke with patient and wife at bedside. He had eaten 100% of his meal during this RD's visit. Abd soft today, not distended.  Labs reviewed Medications reviewed and include:  Insulin, Senokot-S D5 1/2 NS at 74mL/hr --> 204 calories   Diet Order:   Diet Order           DIET DYS 3 Room service appropriate? Yes; Fluid consistency: Nectar Thick  Diet effective now          EDUCATION NEEDS:   Education needs have been addressed  Skin:  Skin Assessment: Skin Integrity Issues: Skin Integrity Issues:: Stage I Stage I: heel  Last BM:  09/30/2017  Height:   Ht Readings from Last 1 Encounters:  09/19/17 6\' 1"  (1.854 m)    Weight:   Wt Readings from Last 1 Encounters:  09/30/17 165 lb 12.6 oz (75.2 kg)    Ideal Body Weight:   83.6 kg  BMI:  Body mass index is 21.87 kg/m.  Estimated Nutritional Needs:   Kcal:  1850-2050  Protein:  80-85 grams  Fluid:  > 1.8 L    Satira Anis. Edwin Aird, MS, RD LDN Inpatient Clinical Dietitian Pager [phone]

## 2017-09-30 NOTE — Final Consult Note (Signed)
Consultant Final Sign-Off Note    Assessment/Final recommendations  [name] is a 83 y.o. male followed by me for SBO   Wound care (if applicable):    Diet at discharge: okay for soft diet/ what speech recommends   Activity at discharge: per primary team   Follow-up appointment:  Non needed    Pending results:  Unresulted Labs (From admission, onward)   Start     Ordered   09/30/17 9562  Basic metabolic panel  Daily,   R    Question:  Specimen collection method  Answer:  Lab=Lab collect   09/29/17 0942       Medication recommendations:   Other recommendations:     Thank you for allowing [place] to participate in the care of your patient!  Please consult [place] again if you have further needs for your patient.  Kalman Drape 09/30/2017 10:55 AM    Subjective   CC: SBO  [name] at bedside. She states pt was diagnosed with dementia 11 years ago. She states he ate well yesterday without any issues of nausea or vomiting. 3 BM's reported in chart. Pt states he is feeling well this am. He knows who his wife is today. He denies abdominal pain.   Objective  Vital signs in last 24 hours: Temp:  [98.2 F (36.8 C)-99.5 F (37.5 C)] 98.2 F (36.8 C) (07/08 0826) Pulse Rate:  [83-98] 86 (07/08 0826) Resp:  [17-22] 17 (07/08 0826) BP: (134-158)/(60-78) 156/60 (07/08 0826) SpO2:  [92 %-99 %] 92 % (07/08 0826) Weight:  [75.2 kg (165 lb 12.6 oz)] 75.2 kg (165 lb 12.6 oz) (07/08 0500)  PE: General: well appearing, NAD, alert, cooperative Abd: soft, not distended, +BS, very mild TTP without guarding  Pertinent labs and Studies: Recent Labs    09/28/17 0632 09/29/17 0415  WBC 8.4 9.2  HGB 11.4* 10.2*  HCT 36.9* 32.9*   BMET Recent Labs    09/29/17 0415 09/30/17 0445  NA 137 137  K 4.0 3.7  CL 105 106  CO2 27 25  GLUCOSE 123* 117*  BUN 10 6*  CREATININE 0.73 0.75  CALCIUM 7.9* 8.0*   No results for input(s): LABURIN in the last 72 hours. Results for orders  placed or performed during the hospital encounter of 09/19/17  Culture, blood (routine x 2)     Status: None   Collection Time: 09/19/17 12:13 PM  Result Value Ref Range Status   Specimen Description BLOOD RIGHT ANTECUBITAL  Final   Special Requests   Final    BOTTLES DRAWN AEROBIC AND ANAEROBIC Blood Culture adequate volume   Culture   Final    NO GROWTH 5 DAYS Performed at [facility], [place] [address] [place], [place] [address]    Report Status 09/24/2017 FINAL  Final  Culture, blood (routine x 2)     Status: None   Collection Time: 09/19/17  1:30 PM  Result Value Ref Range Status   Specimen Description BLOOD LEFT ANTECUBITAL  Final   Special Requests   Final    BOTTLES DRAWN AEROBIC AND ANAEROBIC Blood Culture adequate volume   Culture   Final    NO GROWTH 5 DAYS Performed at [facility], [place] [address], [place], [place] [address]    Report Status 09/24/2017 FINAL  Final  Urine culture     Status: None   Collection Time: 09/19/17  2:56 PM  Result Value Ref Range Status   Specimen Description  URINE, CLEAN CATCH  Final   Special Requests NONE  Final   Culture   Final    NO GROWTH Performed at [facility], [place] [address] [place], [place] [address]    Report Status 09/20/2017 FINAL  Final    Imaging: No results found.

## 2017-10-01 ENCOUNTER — Other Ambulatory Visit: Payer: Self-pay | Admitting: Pharmacy Technician

## 2017-10-01 DIAGNOSIS — N179 Acute kidney failure, unspecified: Secondary | ICD-10-CM

## 2017-10-01 DIAGNOSIS — R140 Abdominal distension (gaseous): Secondary | ICD-10-CM

## 2017-10-01 LAB — BASIC METABOLIC PANEL
ANION GAP: 9 (ref 5–15)
BUN: 11 mg/dL (ref 8–23)
CHLORIDE: 105 mmol/L (ref 98–111)
CO2: 23 mmol/L (ref 22–32)
CREATININE: 0.79 mg/dL (ref 0.61–1.24)
Calcium: 8 mg/dL — ABNORMAL LOW (ref 8.9–10.3)
GFR calc non Af Amer: >60 mL/min (ref >60)
Glucose, Bld: 103 mg/dL — ABNORMAL HIGH (ref 70–99)
Potassium: 3.8 mmol/L (ref 3.5–5.1)
Sodium: 137 mmol/L (ref 135–145)

## 2017-10-01 LAB — GLUCOSE, CAPILLARY
Glucose-Capillary: 126 mg/dL — ABNORMAL HIGH (ref 70–99)
Glucose-Capillary: 131 mg/dL — ABNORMAL HIGH (ref 70–99)
Glucose-Capillary: 155 mg/dL — ABNORMAL HIGH (ref 70–99)
Glucose-Capillary: 67 mg/dL — ABNORMAL LOW (ref 70–99)
Glucose-Capillary: 83 mg/dL (ref 70–99)

## 2017-10-01 MED ORDER — CEFUROXIME AXETIL 500 MG PO TABS: 500.0000 mg | ORAL_TABLET | Freq: Two times a day (BID) | ORAL | 0 refills | Status: DC

## 2017-10-01 MED ORDER — ONDANSETRON HCL 4 MG PO TABS: 4.0000 mg | ORAL_TABLET | Freq: Four times a day (QID) | ORAL | 0 refills | Status: AC | PRN

## 2017-10-01 MED ORDER — DM-GUAIFENESIN ER 30-600 MG PO TB12
1.0000 | ORAL_TABLET | Freq: Two times a day (BID) | ORAL | Status: DC
  Administered 2017-10-01 – 2017-10-02 (×3): 1 via ORAL
  Filled 2017-10-01 (×3): qty 1

## 2017-10-01 MED ORDER — HYDRALAZINE HCL 10 MG PO TABS: 10.0000 mg | ORAL_TABLET | Freq: Three times a day (TID) | ORAL | 0 refills | Status: AC

## 2017-10-01 MED ORDER — DM-GUAIFENESIN ER 30-600 MG PO TB12: 1.0000 | ORAL_TABLET | Freq: Two times a day (BID) | ORAL | 0 refills | Status: DC

## 2017-10-01 MED ORDER — HYDRALAZINE HCL 10 MG PO TABS
10.0000 mg | ORAL_TABLET | Freq: Three times a day (TID) | ORAL | Status: DC
  Administered 2017-10-01 – 2017-10-02 (×4): 10 mg via ORAL
  Filled 2017-10-01 (×4): qty 1

## 2017-10-01 MED ORDER — METOPROLOL TARTRATE 25 MG PO TABS
25.0000 mg | ORAL_TABLET | Freq: Two times a day (BID) | ORAL | Status: DC
Start: 2017-10-01 — End: 2017-10-02
  Administered 2017-10-01 – 2017-10-02 (×3): 25 mg via ORAL
  Filled 2017-10-01 (×3): qty 1

## 2017-10-01 MED ORDER — METOPROLOL TARTRATE 25 MG PO TABS: 25.0000 mg | ORAL_TABLET | Freq: Two times a day (BID) | ORAL | 0 refills | Status: AC

## 2017-10-01 NOTE — Progress Notes (Signed)
  Speech Language Pathology Treatment: Dysphagia  Patient Details Name: [name] MRN: [id number] DOB: 04-Mar-1935 Today's Date: 10/01/2017 Time: 7711-6579 SLP Time Calculation (min) (ACUTE ONLY): 11 min  Assessment / Plan / Recommendation Clinical Impression  SLP provided skilled observation and assistance with feeding during lunch meal. Pt has prolonged mastication but minimal to trace residue remaining in his oral cavity given extra time. He does not have any signs of impulsivity, waiting for SLP to initiate each bite, giving him ample time to clear his oral cavity before his next bolus. SLP also administered advanced trials of thin liquids during meal with one cough following straw sips. Pt has mild audible congestion at baseline but does not have baseline coughing as he did on previous date, and no coughing or changes in vocal quality were noted given cup sips of water. Of note, pt could not be challenged with larger, consecutive boluses despite cueing, instead preferring to take small, single sips. Recommend Dys 3 diet and thin liquids by cup. SLP to follow for tolerance.   HPI HPI: 83 year old male admitted with gross hematuria, etiology unclear. Past medical history of dementia, peripheral vascular disease status post right to left femoral-femoral graft, right iliac stents, chronic systolic CHF, COPD, type 2 diabetes, history of colon cancer status post resection with hematuria. Assessed by SLP 12/11/13 with recommendation for dys 3/thin liquids. Referred for swallowing evaluation.      SLP Plan  Continue with current plan of care       Recommendations  Diet recommendations: Dysphagia 3 (mechanical soft);Thin liquid Liquids provided via: Cup;No straw Medication Administration: Crushed with puree Supervision: Staff to assist with self feeding;Full supervision/cueing for compensatory strategies Compensations: Minimize environmental distractions;Slow rate;Small sips/bites Postural  Changes and/or Swallow Maneuvers: Seated upright 90 degrees                Oral Care Recommendations: Oral care BID Follow up Recommendations: Skilled Nursing facility SLP Visit Diagnosis: Dysphagia, unspecified (R13.10) Plan: Continue with current plan of care       GO                [name] 10/01/2017, 2:40 PM  [name], M.A. CCC-SLP [phone]

## 2017-10-01 NOTE — Patient Outreach (Signed)
[facility]) Care Management  10/01/2017  VARNELL ORVIS 1934-06-19 [id number]   Incoming call from patients wife, [name]. She stated that she was calling me back because she had a voicemail from me. I informed her that I had spoken to [facility] and was informed that Mr. Macomber has been in the hospital for a long period of time. Mrs. Deroo did question, What exactly is patient assistance? I reminded her that she had spoken to [facility] and getting help with the cost of a couple of his medications, she stated that she still had the paperwork. I told her that as of now she can just contact upon his discharge and let me know if they wanted to continue with the process.   Updated Alwyn Ren on conversation with wife.  Maud Deed [place], [place] Management [phone]

## 2017-10-01 NOTE — Progress Notes (Signed)
Daily Progress Note   Patient Name: [name]       Date: 10/01/2017 DOB: 09-24-1934  Age: 83 y.o. MRN#: [id number] Attending Physician: Mendel Corning, MD Primary Care Physician: Glendale Chard, MD Admit Date: 09/19/2017  Reason for Consultation/Follow-up: Establishing goals of care  Subjective: [name] remains alert and interactive today.    He remains confused, but denies any complaints during encounter.  No family at bedside today. ROS  Denies complaints, but confused with dementia  Length of Stay: 12  Current Medications: Scheduled Meds:  . chlorhexidine  15 mL Mouth Rinse BID  . fluticasone furoate-vilanterol  1 puff Inhalation Daily  . hydrALAZINE  10 mg Intravenous Q8H  . insulin aspart  0-15 Units Subcutaneous TID WC  . insulin aspart  0-5 Units Subcutaneous QHS  . mouth rinse  15 mL Mouth Rinse q12n4p  . metoprolol tartrate  2.5 mg Intravenous Q6H  . senna-docusate  1 tablet Oral BID    Continuous Infusions: . sodium chloride Stopped (09/30/17 1600)  . cefTRIAXone (ROCEPHIN)  IV Stopped (09/30/17 1346)  . chlorproMAZINE (THORAZINE) IV 25 mg (09/28/17 1712)  . famotidine (PEPCID) IV Stopped (09/30/17 1446)    PRN Meds: sodium chloride, acetaminophen **OR** acetaminophen, chlorproMAZINE (THORAZINE) IV, labetalol, ondansetron **OR** ondansetron (ZOFRAN) IV, RESOURCE THICKENUP CLEAR  Physical Exam  Constitutional: He appears well-developed and well-nourished.  HENT:  Head: Normocephalic.  Neurological:  Awake, alert, much more interactive.  No acute distress.  Nursing note and vitals reviewed.           Vital Signs: BP 125/80   Pulse 87   Temp 97.7 F (36.5 C)   Resp 17   Ht 6\' 1"  (1.854 m)   Wt 75.2 kg (165 lb 12.6 oz)   SpO2 100%   BMI 21.87  kg/m  SpO2: SpO2: 100 % O2 Device: O2 Device: Room Air O2 Flow Rate:    Intake/output summary:   Intake/Output Summary (Last 24 hours) at 10/01/2017 0022 Last data filed at 09/30/2017 1841 Gross per 24 hour  Intake 932.72 ml  Output 850 ml  Net 82.72 ml   LBM: Last BM Date: 09/30/17 Baseline Weight: Weight: 72.3 kg (159 lb 6.3 oz) Most recent weight: Weight: 75.2 kg (165 lb 12.6 oz)       Palliative Assessment/Data: PPS:  10%      Patient Active Problem List   Diagnosis Date Noted  . Goals of care, counseling/discussion   . Adynamic ileus ([place])   . Palliative care encounter   . Pressure injury of skin 09/25/2017  . Hematuria 09/19/2017  . Gross hematuria 09/19/2017  . Sepsis ([place]) 05/02/2017  . Diabetes mellitus ([place]) 12/25/2013  . Type I (juvenile type) diabetes mellitus with peripheral circulatory disorders, not stated as uncontrolled(250.71) 12/18/2013  . Acute DVT (deep venous thrombosis) ([place]) 12/12/2013  . PNA (pneumonia) 12/11/2013  . Candida esophagitis ([place]) 09/06/2013  . Chronic systolic CHF (congestive heart failure) ([place]) 09/06/2013  . CAP (community acquired pneumonia) 09/05/2013  . COPD exacerbation ([place]) 09/04/2013  . Microcytic anemia 09/03/2013  . Chronic respiratory failure with hypoxia ([place]) 09/01/2013  . Symptomatic anemia 09/01/2013  . DOE (dyspnea on exertion) 09/01/2013  . Atherosclerosis of native arteries of the extremities with ulceration ([place]) 06/24/2013  . PAD (peripheral artery disease) ([place]) 04/28/2013  . Chest pain 03/23/2013  . Nausea with vomiting 03/23/2013  . Pain in joint, ankle and foot 03/23/2013  . Dementia 07/04/2012  . URI (upper respiratory infection) 07/04/2012  . PVD (peripheral vascular disease) ([place]) 04/21/2012  . Leg pain 04/18/2012  . Upper airway cough syndrome 06/02/2007  . Hyperlipidemia 05/07/2007  . PVD 05/07/2007  . COPD COPD II  05/07/2007  . DYSPNEA 05/07/2007  . Essential hypertension 05/07/2007     Palliative Care Assessment & Plan   Patient Profile: 83 y.o. male  with past medical history of Alzheimer's dementia, CHF with EF of 20%, DM2, PVD s/p stenting and graft, colon cancer s/p resection who was admitted on 09/19/2017 with hematuria.  He was found to have a ruptured diverticulum in his bladder.  He also had acute kidney injury.  Shortly after admission he developed abdominal pain and was found to have an ileus.  An n/g has been placed for decompression that is putting out thick black/green liquid.  Thus far his ileus has not resolved and he has not been able to take in any nutrition since admission on 7/1.   Per urology the perforated bladder will heal on it's own.  A foley will remain in place for 2 weeks to keep the bladder decompressed and allow it to heal.  Assessment/Recommendations/Plan  - Mr. Martine appears to be doing much better clinically over the last 48 hours. Alert and interactive and working to advance diet.  Family desires to continue current care plan at this point. - PMT will continue to follow intermittently.  Please call if there are specific needs with which we can be of assistance.  Goals of Care and Additional Recommendations:  Limitations on Scope of Treatment: Full Scope Treatment  Code Status:  DNR  Prognosis:   Unable to determine  Discharge Planning: To Be Determined  Thank you for allowing the Palliative Medicine Team to assist in the care of this patient.   Total Time 20 mins Prolonged Time Billed no      Greater than 50%  of this time was spent counseling and coordinating care related to the above assessment and plan.  Micheline Rough, MD [place] Team [phone]  Please contact Palliative Medicine Team phone at [phone] for questions and concerns.

## 2017-10-01 NOTE — Progress Notes (Signed)
CSW received denial from insurance for rehab. MD completing a peer-to-peer. Wife updated.   Edwin Locus Corinthia Helmers LCSW [phone]

## 2017-10-01 NOTE — Progress Notes (Addendum)
Physical Therapy Treatment Patient Details Name: [name] MRN: [id number] DOB: 12-20-1934 Today's Date: 10/01/2017    History of Present Illness 83 y.o. male admitted on 09/19/17 for abdominal distension secondary to ileus (NGT placed initially), hematuria in the setting of plavix, peforated bladder pt underwent CBI, indwelling urinary catheter placed, acute kidney injury with metabolic acidosis, and accelerated HTN.  Pt with significant PMH of DM 2, PVD, COPD, CHF, anemia, Alzheimer's dementia, spine surgery, multiple LE bypass grafts on the R, and colon CA s/p resection.    PT Comments    Pt was able to participate with PT, following basic, one step commands with increased time to process and multimodal cues for initiation.  He was able to tell me his name and recognize his wife (although not by the correct name).  He participated in bed mobility for peri care, and transfers OOB to the chair with one person assist from elevated surface today.  He also participated in UE and LE exercises from the chair with cues for initiation, and assist to complete through full ROM.  I believe he would participate in a rehab program.  PT will continue to follow acutely to try to progress safe mobility.   Follow Up Recommendations  SNF;Supervision/Assistance - 24 hour     Equipment Recommendations  Wheelchair (measurements PT);Wheelchair cushion (measurements PT);Hospital bed;3in1 (PT);Rolling walker with 5" wheels;Other (comment)(hoyer lift)    Recommendations for Other Services   NA     Precautions / Restrictions Precautions Precautions: Fall    Mobility  Bed Mobility Overal bed mobility: Needs Assistance Bed Mobility: Rolling;Supine to Sit Rolling: Min assist   Supine to sit: Mod assist;HOB elevated     General bed mobility comments: Min assist to roll bil.  Verbal cues and then manual initiation at flexed knees with pt following with trunk and using hands to pull on bed rail to assist.   Supine to sit, also with basic verbal cues and then manual assist to initiate movement and then pt followed therapist's initiation, mod assist to help complete movement of legs EOB and trunk up to sitting.  Hand held assist provided and HOB ~30 degrees  Transfers Overall transfer level: Needs assistance Equipment used: 1 person hand held assist Transfers: Sit to/from [facility] Sit to Stand: Mod assist;From elevated surface Stand pivot transfers: Mod assist;From elevated surface       General transfer comment: Pt able to get to fully upright standing today with one hand on bed rail and one hand on therapist' s shoulder.  Pt needed support for balance, verbal and manual assist to reach for far armrest of chair and pt tood 2-3 pivotal steps around to the chair with uncontrolled descent to sit in low seat.  Attempted to stand again from the recliner and pt unable with therapist initiation and one person assist.  Advised RN that they would need two people to get him back to bed.          Balance Overall balance assessment: Needs assistance Sitting-balance support: Feet supported;Bilateral upper extremity supported Sitting balance-Leahy Scale: Fair     Standing balance support: Bilateral upper extremity supported Standing balance-Leahy Scale: Poor                              Cognition Arousal/Alertness: Awake/alert Behavior During Therapy: WFL for tasks assessed/performed Overall Cognitive Status: History of cognitive impairments - at baseline  General Comments: Pt with history of Alzheimer's dementia (recognized his wife, but not by name, and told me his name)      Exercises General Exercises - Upper Extremity Shoulder Flexion: AAROM;Both;10 reps Elbow Flexion: AAROM;Both;10 reps General Exercises - Lower Extremity Long Arc Quad: AROM;AAROM;Both;10 reps Hip Flexion/Marching: AAROM;Both;20 reps         Pertinent Vitals/Pain Pain Assessment: Faces Faces Pain Scale: No hurt           PT Goals (current goals can now be found in the care plan section) Acute Rehab PT Goals Patient Stated Goal: none stated Progress towards PT goals: Progressing toward goals    Frequency    Min 2X/week      PT Plan Current plan remains appropriate       AM-PAC PT "6 Clicks" Daily Activity  Outcome Measure  Difficulty turning over in bed (including adjusting bedclothes, sheets and blankets)?: Unable Difficulty moving from lying on back to sitting on the side of the bed? : Unable Difficulty sitting down on and standing up from a chair with arms (e.g., wheelchair, bedside commode, etc,.)?: Unable Help needed moving to and from a bed to chair (including a wheelchair)?: A Lot Help needed walking in hospital room?: A Lot Help needed climbing 3-5 steps with a railing? : Total 6 Click Score: 8    End of Session   Activity Tolerance: Patient tolerated treatment well Patient left: in chair;with call bell/phone within reach;with chair alarm set;with family/visitor present Nurse Communication: Mobility status PT Visit Diagnosis: Muscle weakness (generalized) (M62.81);History of falling (Z91.81)     Time: 8676-7209 PT Time Calculation (min) (ACUTE ONLY): 37 min  Charges:  $Therapeutic Exercise: 8-22 mins $Therapeutic Activity: 8-22 mins          Flor Whitacre B. [place], [place], DPT [phone]            10/01/2017, 12:59 PM

## 2017-10-01 NOTE — Progress Notes (Signed)
Patient has been approved for SNF by insurance. Provided wife with SNF list for discharge Wednesday.   Edwin Locus Jadarrius Maselli LCSW [id number]

## 2017-10-01 NOTE — Progress Notes (Addendum)
[facility]                                                                              Patient Demographics  [name], is a 83 y.o. male, DOB - 07-08-1934, INO:[id number]  Admit date - 09/19/2017   Admitting Physician Cristy Folks, MD  Outpatient Primary MD for the patient is Glendale Chard, MD  Outpatient specialists:   LOS - 12  days   Medical records reviewed and are as summarized below:    Chief Complaint  Patient presents with  . Hematuria       Brief summary   83 year old African-American male with a past medical history of dementia, peripheral vascular disease status post right to left femoral-femoral graft, right iliac stents, chronic systolic CHF with EF of 20 to 25%, COPD, type 2 diabetes, history of colon cancer status post resection with hematuria.  Patient was seen by urology and was hospitalized.  He was started on continuous bladder irrigation.  Hematuria resolved.  Foley was removed.  Subsequently he developed ileus.  Assessment & Plan    Principal Problem:  Abdominal distention secondary to ileus -Patient was noted to have worsening abdominal distention on 7/1, NG tube was placed -CT abdomen showed worsening abdominal ileus with multiple air-fluid levels findings compatible with ileus -Serial abdominal x-rays were obtained, general surgery was also consulted. -On 7/7 overnight, patient pulled out his NGT and subsequently had 2 large bowel movements.  He became alert and oriented and was started on clear liquid diet. -Patient tolerating dysphagia 3 diet, surgery has signed off.   -Continue PT, increase ambulation, stool softeners   Active problems Gross hematuria in the setting of Plavix, perforated urinary bladder -Initially appeared to have resolved, urology was consulted.  Patient underwent CBI. -Foley catheter was discontinued on 6/29, subsequently had multiple episodes of urinary retention on 7/1, Foley catheter was  reinserted. -Given patient continued to have abdominal pain, CT abdomen and pelvis was obtained which showed perforated urinary bladder -CT cystogram confirmed the presence of extraperitoneal bladder rupture with extravasation of contrast into the perivesical fat the prostate gland  -Urology following, continue Foley catheter, remain indwelling for 2 weeks, continue antibiotics while on catheter awaiting the healing of bladder rupture.  Patient will need cystoscopy and cystogram outpatient.    Acute kidney injury with metabolic acidosis -Baseline creatinine around 1.1, 1.48 at the time of admission, plateaued at 2.09 -Creatinine at baseline, IV fluids discontinued   Leukocytosis -Resolved, likely due to #1 and #2, continue IV Rocephin -Cultures negative so far, change to Ceftin at discharge.  Acute metabolic encephalopathy on dementia likely due to acute illness -Much more alert and awake, oriented, close to baseline  History of PVD -Plavix currently held due to hematuria.   - will restart Plavix at the time of discharge.  Chronic systolic CHF -EF 20 to 09% per 2D echo in 08/2013 -DC IV fluids  Accelerated hypertension -BP improving, DC IV hydralazine and IV metoprolol -Restart oral antihypertensives, started on oral Toprol-XL and hydralazine  Diabetes mellitus type 2, NIDDM -CBG stable dysphagia 3 diet   COPD -No wheezing  Code Status:  DNR DVT Prophylaxis:   SCD's Family Communication: Discussed in detail with patient's wife yesterday in detail on 7/8. Called patient's daughter today 7/9 and left detailed voicemail.   Disposition Plan: Per social work, patient's insurance denied rehab.  Patient's family is requesting for nursing facility and do not feel patient's wife is able to take care of him (patient's wife had mentioned it to me as well).  Patient's family has decided to appeal.  I called Dr Amalia Hailey [phone] for peer-to-peer review, went to voicemail, will continue  to try again. Addendum: 11:50AM D/w Dr Amalia Hailey, requested updated SLP eval, PT eval. I have placed the consults. Dr. Amalia Hailey and Percell Locus SW will follow up on these tests, will await outcome of the review.      Time Spent in minutes   25 minutes  Procedures:  None  Consultants:   Urology Palliative medicine General surgery  Antimicrobials:   IV Rocephin   Medications  Scheduled Meds: . chlorhexidine  15 mL Mouth Rinse BID  . fluticasone furoate-vilanterol  1 puff Inhalation Daily  . hydrALAZINE  10 mg Oral Q8H  . insulin aspart  0-15 Units Subcutaneous TID WC  . insulin aspart  0-5 Units Subcutaneous QHS  . mouth rinse  15 mL Mouth Rinse q12n4p  . metoprolol tartrate  25 mg Oral BID  . senna-docusate  1 tablet Oral BID   Continuous Infusions: . sodium chloride Stopped (09/30/17 1600)  . cefTRIAXone (ROCEPHIN)  IV Stopped (09/30/17 1346)  . chlorproMAZINE (THORAZINE) IV 25 mg (09/28/17 1712)  . famotidine (PEPCID) IV Stopped (09/30/17 1446)   PRN Meds:.sodium chloride, acetaminophen **OR** acetaminophen, chlorproMAZINE (THORAZINE) IV, labetalol, ondansetron **OR** ondansetron (ZOFRAN) IV, RESOURCE THICKENUP CLEAR   Antibiotics   Anti-infectives (From admission, onward)   Start     Dose/Rate Route Frequency Ordered Stop   10/01/17 0000  cefUROXime (CEFTIN) 500 MG tablet     500 mg Oral 2 times daily with meals 10/01/17 0834 10/15/17 2359   09/26/17 1400  cefTRIAXone (ROCEPHIN) 1 g in sodium chloride 0.9 % 100 mL IVPB     1 g 200 mL/hr over 30 Minutes Intravenous Every 24 hours 09/26/17 1358     09/25/17 1345  cefTRIAXone (ROCEPHIN) 1 g in sodium chloride 0.9 % 100 mL IVPB  Status:  Discontinued     1 g 200 mL/hr over 30 Minutes Intravenous Every 24 hours 09/25/17 1334 09/25/17 1340   09/20/17 1200  cefTRIAXone (ROCEPHIN) 1 g in sodium chloride 0.9 % 100 mL IVPB     1 g 200 mL/hr over 30 Minutes Intravenous Every 24 hours 09/19/17 2009 09/25/17 1405   09/19/17 1530   cefTRIAXone (ROCEPHIN) 1 g in sodium chloride 0.9 % 100 mL IVPB     1 g 200 mL/hr over 30 Minutes Intravenous  Once 09/19/17 1518 09/19/17 1557   09/19/17 1300  cefTRIAXone (ROCEPHIN) 1 g in sodium chloride 0.9 % 100 mL IVPB  Status:  Discontinued     1 g 200 mL/hr over 30 Minutes Intravenous  Once 09/19/17 1258 09/19/17 1259        Subjective:   [name] was seen and examined today.  Alert and awake, tolerating diet, no new complaints.  No fever chills or hiccups.  No hematuria.    Objective:   Vitals:   10/01/17 0007 10/01/17 0533 10/01/17 0713 10/01/17 0839  BP: 125/80 (!) 147/68 (!) 155/80   Pulse: 87 86 80 82  Resp:  17  18  Temp:  98.4 F (36.9 C)    TempSrc:  Oral    SpO2: 100% 96% 100% 98%  Weight:  74.4 kg (164 lb 0.4 oz)    Height:        Intake/Output Summary (Last 24 hours) at 10/01/2017 1127 Last data filed at 10/01/2017 0451 Gross per 24 hour  Intake 932.72 ml  Output 300 ml  Net 632.72 ml     Wt Readings from Last 3 Encounters:  10/01/17 74.4 kg (164 lb 0.4 oz)  09/17/17 69.9 kg (154 lb)  08/05/17 70.3 kg (155 lb)     Exam   General: Alert and awake, much more oriented, NAD  Eyes:   HEENT:  Atraumatic, normocephalic  Cardiovascular: S1 S2 auscultated, RRR no pedal edema b/l  Respiratory: Clear to auscultation bilaterally, no wheezing, rales or rhonchi  Gastrointestinal: Soft, nontender, nondistended, + bowel sounds  Ext: no pedal edema bilaterally  Neuro: no new deficits  Musculoskeletal: No digital cyanosis, clubbing  Skin: No rashes  Psych: close to his baseline   Data Reviewed:  I have personally reviewed following labs and imaging studies  Micro Results No results found for this or any previous visit (from the past 240 hour(s)).  Radiology Reports Ct Abdomen Pelvis Wo Contrast  Result Date: 09/24/2017 CLINICAL DATA:  83 year old male with history of potential bowel obstruction. EXAM: CT ABDOMEN AND PELVIS WITHOUT  CONTRAST TECHNIQUE: Multidetector CT imaging of the abdomen and pelvis was performed following the standard protocol without IV contrast. COMPARISON:  CT of the abdomen and pelvis 09/19/2017. FINDINGS: Lower chest: Emphysematous changes. Small right pleural effusion lying dependently. Atherosclerotic calcifications in the descending thoracic aorta as well as the left anterior descending and right coronary arteries. Nasogastric tube in the esophagus. Small hiatal hernia. Hepatobiliary: No definite cystic or solid hepatic lesions are confidently identified on today's noncontrast CT examination. Unenhanced appearance of the gallbladder is unremarkable. Pancreas: No definite pancreatic mass or peripancreatic fluid collections or inflammatory changes are noted on today's noncontrast CT examination. Spleen: Unremarkable. Adrenals/Urinary Tract: Unenhanced appearance of the adrenal glands and right kidney is unremarkable. 2 cm exophytic low-attenuation lesion extending off the medial aspect of the lower pole of the left kidney, incompletely characterized on today's noncontrast CT examination, but similar to prior studies, likely a tiny cyst. No hydroureteronephrosis. Urinary bladder is nearly decompressed around an indwelling Foley balloon catheter. There appears to be combination of intramural gas in the wall of the urinary bladder, as well as some extraluminal gas adjacent to the urinary bladder both anteriorly (in the space of Retzius) and superiorly. This is best appreciated on axial images 62-70 and sagittal images 54-66. Inflammatory changes are also noted throughout the adjacent soft tissues where there is also small volume of intermediate attenuation fluid, which appears to be predominantly located in the space of Retzius. Stomach/Bowel: Nasogastric tube extends into the stomach. There multiple prominent borderline dilated loops of small bowel with multiple air-fluid levels. No pathologic dilatation of colon. Gas  and stool is noted throughout the colon extending to the level of the rectum. Suture line in the rectum indicative of prior partial proctocolectomy. Normal appendix. Vascular/Lymphatic: Aortic atherosclerosis. Fem-fem bypass graft incompletely evaluated on today's noncontrast CT examination. No lymphadenopathy noted in the abdomen or pelvis. Reproductive: Prostate gland and seminal vesicles are unremarkable in appearance. Other: Small volume of ascites. Trace volume of pneumoperitoneum adjacent to the urinary bladder, as discussed above. Musculoskeletal: Posterior orthopedic fixation hardware in place at L4-L5. Interbody graft at L4-L5. 13  mm of anterolisthesis of L4 upon L5. There are no aggressive appearing lytic or blastic lesions noted in the visualized portions of the skeleton. IMPRESSION: 1. Findings are highly concerning for perforated urinary bladder. This includes both intramural gas, extraluminal gas adjacent to the urinary bladder, and gas and fluid in the space of Retzius. 2. Small volume of ascites or urine noted in the peritoneal cavity. 3. Multiple prominent borderline dilated loops of small bowel with multiple air-fluid levels. Findings are not compatible with obstruction, but may reflect a mild ileus. 4. Aortic atherosclerosis, in addition to at least 2 vessel coronary artery disease. 5. Small right pleural effusion lying dependently. 6. Emphysema. 7. Additional incidental findings, as above. These results will be called to the ordering clinician or representative by the Radiologist Assistant, and communication documented in the PACS or zVision Dashboard. Aortic Atherosclerosis (ICD10-I70.0) and Emphysema (ICD10-J43.9). Electronically Signed   By: Vinnie Langton M.D.   On: 09/24/2017 12:50   Ct Pelvis Wo Contrast  Result Date: 09/24/2017 CLINICAL DATA:  Suspected perforation of the urinary bladder on earlier noncontrast CT. Recent gross hematuria. History of diabetes, peripheral vascular  disease and dementia. EXAM: CT PELVIS WITHOUT CONTRAST TECHNIQUE: Multidetector CT imaging of the pelvis was performed following the installation of 50 cc Omnipaque 300 diluted in 450 cc of saline through the indwelling Foley catheter. No intravenous contrast was administered. COMPARISON:  Noncontrast CT 09/19/2017 and 09/24/2017. FINDINGS: Urinary Tract: There is moderate anterior extravasation of contrast from the bladder into the prevesical fat. Contrast extends along the anterior abdominal wall, asymmetric to the left, admixed with extraluminal air. Contrast also extends into the prostate gland, asymmetric to the right. No contrast extension into the peritoneal cavity is seen. A Foley catheter is in place. There is mild bladder wall thickening and trabeculation. The distal ureters do not appear dilated. Bowel: Multiple borderline dilated loops of small bowel with air-fluid levels are again noted, most consistent with an ileus. There is a rectal anastomosis. The visualized colon and appendix otherwise appear normal. Vascular/Lymphatic: No enlarged pelvic lymph nodes identified. Extensive aortic and branch vessel atherosclerosis. Postsurgical changes in both groins from previous femoral-femoral bypass graft. Reproductive: Extravasated bladder contrast extends into the substance of the prostate gland, asymmetric to the right. The seminal vesicles appear normal. Other: No free air or peritoneal contrast. Musculoskeletal: No evidence of pelvic fracture. Postsurgical changes in the lower lumbar spine and sacroiliac degenerative changes are noted. IMPRESSION: 1. CT cystogram confirms the presence of extraperitoneal bladder rupture with extravasation of contrast into the prevesical fat and prostate gland. No peritoneal contrast demonstrated. 2. No evidence of underlying pelvic fracture. 3. Probable small bowel ileus. 4.  Aortic Atherosclerosis (ICD10-I70.0). Electronically Signed   By: Richardean Sale M.D.   On:  09/24/2017 16:49   Dg Chest Port 1 View  Result Date: 09/21/2017 CLINICAL DATA:  Dyspnea. EXAM: PORTABLE CHEST 1 VIEW COMPARISON:  05/04/2017 and prior studies. FINDINGS: Cardiomediastinal silhouette is normal. Mediastinal contours appear intact. Calcific atherosclerotic disease of the aorta. There is no evidence of pleural effusion or pneumothorax. Low lung volumes. Multiple tiny hyperdense nodules overlie bilateral lung bases, right greater than left. Osseous structures are without acute abnormality. Soft tissues are grossly normal. IMPRESSION: Multiple tiny hyperdense nodules overlie bilateral lung bases, right greater than left. These may represent pleural calcifications, pleuroparenchymal scarring or high density nodules along the bronchovascular tree of the lower lobes. Calcific atherosclerotic disease and tortuosity of the aorta. Electronically Signed   By: Thomas Hoff  Dimitrova M.D.   On: 09/21/2017 11:16   Dg Abd Acute W/chest  Result Date: 09/23/2017 CLINICAL DATA:  Abdominal distension EXAM: DG ABDOMEN ACUTE W/ 1V CHEST COMPARISON:  09/21/2017, CT 09/19/2017 FINDINGS: Single-view chest demonstrates mild basilar atelectasis. Stable cardiomediastinal silhouette with aortic atherosclerosis. Tiny calcified nodules at the lung bases as before. Supine and upright views of the abdomen demonstrate no free air beneath the diaphragm. Development of multiple loops of dilated air-filled small bowel measuring up to 3.1 cm. Postsurgical changes in the pelvis. Right iliac stent. IMPRESSION: 1. No radiographic evidence for acute cardiopulmonary abnormality. 2. Development of multiple air-filled dilated loops of small bowel with some distal gas present, findings could be secondary to an ileus, however partial small bowel obstruction could also produce this appearance. Electronically Signed   By: Donavan Foil M.D.   On: 09/23/2017 01:39   Dg Abd Portable 1v-small Bowel Obstruction Protocol-initial, 8 Hr  Delay  Result Date: 09/28/2017 CLINICAL DATA:  Small bowel obstruction, continued surveillance. EXAM: PORTABLE ABDOMEN - 1 VIEW COMPARISON:  09/26/2017.  09/27/2017. FINDINGS: Enteric contrast is now seen in the RIGHT colon, indicating a degree of improvement. Enteric contrast remains within mildly prominent small bowel loops indicating a degree of partial obstruction. NG tube tip in the stomach. Spinal fixation device spans the L4 and L5 segments. IMPRESSION: Slight improvement partial SBO. Electronically Signed   By: Staci Righter M.D.   On: 09/28/2017 07:27   Dg Abd Portable 1v  Result Date: 09/24/2017 CLINICAL DATA:  NG tube placement EXAM: PORTABLE ABDOMEN - 1 VIEW COMPARISON:  09/23/2017, CT 09/19/2017 FINDINGS: Lung bases are clear. Esophageal tube tip overlies the proximal stomach. Persistent gaseous enlargement of small bowel up to 3.2 cm. IMPRESSION: 1. Esophageal tube tip overlies the proximal stomach 2. Persistent gaseous enlargement of small bowel, suspect for bowel obstruction Electronically Signed   By: Donavan Foil M.D.   On: 09/24/2017 01:46   Dg Abd Portable 2v  Result Date: 09/27/2017 CLINICAL DATA:  Ileus EXAM: PORTABLE ABDOMEN - 2 VIEW COMPARISON:  CT abdomen and pelvis September 24, 2017 and abdominal radiographs September 26, 2017 FINDINGS: Nasogastric tube tip and side port are in the proximal stomach. There is persistent relatively mild dilatation of multiple loops of small bowel without air-fluid levels. No free air. There is moderate stool in the colon. Lung bases are clear. There is postoperative change in the pelvis and lower lumbar spine regions. IMPRESSION: Nasogastric tube tip and side port in proximal stomach. Persistent loops of dilated small bowel, likely due to ileus or enteritis. Bowel obstruction felt to be less likely. No free air. Areas of postoperative change noted. Electronically Signed   By: Lowella Grip III M.D.   On: 09/27/2017 07:18   Dg Abd Portable 2v  Result  Date: 09/26/2017 CLINICAL DATA:  83 year old male with recently diagnosed extraperitoneal bladder rupture. Ileus. EXAM: PORTABLE ABDOMEN - 2 VIEW COMPARISON:  CT Abdomen and Pelvis 09/24/2017 and earlier. FINDINGS: Portable AP supine and left lateral decubitus views at 1421 hours. No pneumoperitoneum. Enteric tube in place with side hole at the level of the gastric cardia. Negative lung bases. Mildly improved bowel gas pattern since 09/24/2017 but multiple gas-filled small bowel loops remain at the upper limits of normal to mildly dilated. There is large bowel gas present. Lower lumbar interspinous implant and pelvic surgical clips redemonstrated. No acute osseous abnormality identified. IMPRESSION: 1. Mildly improved bowel gas pattern since 09/24/2017. Multiple gas-filled small bowel loops remain compatible with a  degree of residual ileus or partial obstruction. 2. No free air. Electronically Signed   By: Genevie Ann M.D.   On: 09/26/2017 15:07   Ct Renal Stone Study  Result Date: 09/19/2017 CLINICAL DATA:  Gross hematuria.  Urinary retention. EXAM: CT ABDOMEN AND PELVIS WITHOUT CONTRAST TECHNIQUE: Multidetector CT imaging of the abdomen and pelvis was performed following the standard protocol without IV contrast. COMPARISON:  None. FINDINGS: Lower chest: No acute abnormality. Hepatobiliary: No focal liver abnormality is seen. No gallstones, gallbladder wall thickening, or biliary dilatation. Pancreas: Unremarkable. No pancreatic ductal dilatation or surrounding inflammatory changes. Spleen: Normal in size without focal abnormality. Adrenals/Urinary Tract: Adrenal glands appear normal. Kidneys appear normal. No hydronephrosis or renal obstruction is noted. Mild diffuse wall thickening of urinary bladder is noted with small diverticulum seen superiorly. Blood is noted in the dependent portion of the urinary bladder lumen. Stomach/Bowel: Stomach is within normal limits. Appendix appears normal. No evidence of bowel  wall thickening, distention, or inflammatory changes. Vascular/Lymphatic: Aortic atherosclerosis. No enlarged abdominal or pelvic lymph nodes. Reproductive: Prostate is unremarkable. Other: No abdominal wall hernia or abnormality. No abdominopelvic ascites. Musculoskeletal: Postsurgical and degenerative changes are noted in the lower lumbar spine. No acute abnormality is noted. IMPRESSION: No renal or ureteral calculi are noted.  No hydronephrosis is noted. Mild diffuse urinary bladder wall thickening is noted concerning for cystitis. Small bladder diverticulum is noted superiorly. Probable hemorrhage is noted within the urinary bladder lumen; cystoscopy is recommended to rule out possible neoplasm or malignancy. Aortic Atherosclerosis (ICD10-I70.0). Electronically Signed   By: Marijo Conception, M.D.   On: 09/19/2017 15:08    Lab Data:  CBC: Recent Labs  Lab 09/25/17 0421 09/27/17 0438 09/28/17 0632 09/29/17 0415  WBC 7.7 7.3 8.4 9.2  HGB 12.4* 11.2* 11.4* 10.2*  HCT 38.2* 34.7* 36.9* 32.9*  MCV 87.0 88.5 91.8 89.6  PLT 343 294 321 195   Basic Metabolic Panel: Recent Labs  Lab 09/25/17 1322  09/27/17 0438 09/28/17 0632 09/29/17 0415 09/30/17 0445 10/01/17 0437  NA  --    < > 136 136 137 137 137  K  --    < > 3.3* 3.9 4.0 3.7 3.8  CL  --    < > 96* 100 105 106 105  CO2  --    < > 31 28 27 25 23   GLUCOSE  --    < > 164* 125* 123* 117* 103*  BUN  --    < > 10 10 10  6* 11  CREATININE  --    < > 0.68 0.73 0.73 0.75 0.79  CALCIUM  --    < > 7.7* 8.1* 7.9* 8.0* 8.0*  MG 2.1  --  2.0  --   --   --   --    < > = values in this interval not displayed.   GFR: Estimated Creatinine Clearance: 77.5 mL/min (by C-G formula based on SCr of 0.79 mg/dL). Liver Function Tests: Recent Labs  Lab 09/29/17 0415  AST 32  ALT 18  ALKPHOS 47  BILITOT 0.9  PROT 5.3*  ALBUMIN 2.2*   No results for input(s): LIPASE, AMYLASE in the last 168 hours. No results for input(s): AMMONIA in the last 168  hours. Coagulation Profile: No results for input(s): INR, PROTIME in the last 168 hours. Cardiac Enzymes: No results for input(s): CKTOTAL, CKMB, CKMBINDEX, TROPONINI in the last 168 hours. BNP (last 3 results) No results for input(s): PROBNP in the last  8760 hours. HbA1C: No results for input(s): HGBA1C in the last 72 hours. CBG: Recent Labs  Lab 09/30/17 1230 09/30/17 1612 09/30/17 1734 09/30/17 2150 10/01/17 0813  GLUCAP 149* 151* 133* 150* 126*   Lipid Profile: No results for input(s): CHOL, HDL, LDLCALC, TRIG, CHOLHDL, LDLDIRECT in the last 72 hours. Thyroid Function Tests: No results for input(s): TSH, T4TOTAL, FREET4, T3FREE, THYROIDAB in the last 72 hours. Anemia Panel: No results for input(s): VITAMINB12, FOLATE, FERRITIN, TIBC, IRON, RETICCTPCT in the last 72 hours. Urine analysis:    Component Value Date/Time   COLORURINE RED (A) 09/19/2017 1456   APPEARANCEUR HAZY (A) 09/19/2017 1456   LABSPEC 1.009 09/19/2017 1456   PHURINE 6.0 09/19/2017 1456   GLUCOSEU NEGATIVE 09/19/2017 1456   HGBUR LARGE (A) 09/19/2017 1456   BILIRUBINUR NEGATIVE 09/19/2017 1456   BILIRUBINUR negative 05/23/2017 0855   KETONESUR NEGATIVE 09/19/2017 1456   PROTEINUR 100 (A) 09/19/2017 1456   UROBILINOGEN 0.2 05/23/2017 0855   UROBILINOGEN 1.0 12/10/2013 1456   NITRITE NEGATIVE 09/19/2017 1456   LEUKOCYTESUR MODERATE (A) 09/19/2017 1456     Ripudeep Rai M.D. [facility] 10/01/2017, 11:27 AM  Pager: [phone] Between 7am to 7pm - call Pager - [phone]  After 7pm go to [email or web address] - password [email or web address]  Call night coverage person covering after 7pm

## 2017-10-02 ENCOUNTER — Ambulatory Visit: Payer: Self-pay | Admitting: Pharmacy Technician

## 2017-10-02 ENCOUNTER — Ambulatory Visit: Payer: PPO | Admitting: Internal Medicine

## 2017-10-02 DIAGNOSIS — R262 Difficulty in walking, not elsewhere classified: Secondary | ICD-10-CM | POA: Diagnosis not present

## 2017-10-02 DIAGNOSIS — I5042 Chronic combined systolic (congestive) and diastolic (congestive) heart failure: Secondary | ICD-10-CM | POA: Diagnosis not present

## 2017-10-02 DIAGNOSIS — F0390 Unspecified dementia without behavioral disturbance: Secondary | ICD-10-CM | POA: Diagnosis not present

## 2017-10-02 DIAGNOSIS — N3289 Other specified disorders of bladder: Secondary | ICD-10-CM | POA: Diagnosis not present

## 2017-10-02 DIAGNOSIS — Z7401 Bed confinement status: Secondary | ICD-10-CM | POA: Diagnosis not present

## 2017-10-02 DIAGNOSIS — I10 Essential (primary) hypertension: Secondary | ICD-10-CM | POA: Diagnosis not present

## 2017-10-02 DIAGNOSIS — R140 Abdominal distension (gaseous): Secondary | ICD-10-CM | POA: Diagnosis not present

## 2017-10-02 DIAGNOSIS — F0280 Dementia in other diseases classified elsewhere without behavioral disturbance: Secondary | ICD-10-CM | POA: Diagnosis not present

## 2017-10-02 DIAGNOSIS — Z85038 Personal history of other malignant neoplasm of large intestine: Secondary | ICD-10-CM | POA: Diagnosis not present

## 2017-10-02 DIAGNOSIS — N179 Acute kidney failure, unspecified: Secondary | ICD-10-CM | POA: Diagnosis not present

## 2017-10-02 DIAGNOSIS — Z66 Do not resuscitate: Secondary | ICD-10-CM | POA: Diagnosis not present

## 2017-10-02 DIAGNOSIS — J9611 Chronic respiratory failure with hypoxia: Secondary | ICD-10-CM | POA: Diagnosis not present

## 2017-10-02 DIAGNOSIS — J449 Chronic obstructive pulmonary disease, unspecified: Secondary | ICD-10-CM

## 2017-10-02 DIAGNOSIS — M6281 Muscle weakness (generalized): Secondary | ICD-10-CM | POA: Diagnosis not present

## 2017-10-02 DIAGNOSIS — G9341 Metabolic encephalopathy: Secondary | ICD-10-CM | POA: Diagnosis not present

## 2017-10-02 DIAGNOSIS — R1314 Dysphagia, pharyngoesophageal phase: Secondary | ICD-10-CM | POA: Diagnosis not present

## 2017-10-02 DIAGNOSIS — F0150 Vascular dementia without behavioral disturbance: Secondary | ICD-10-CM

## 2017-10-02 DIAGNOSIS — I4891 Unspecified atrial fibrillation: Secondary | ICD-10-CM | POA: Diagnosis not present

## 2017-10-02 DIAGNOSIS — T83098A Other mechanical complication of other indwelling urethral catheter, initial encounter: Secondary | ICD-10-CM | POA: Diagnosis not present

## 2017-10-02 DIAGNOSIS — R05 Cough: Secondary | ICD-10-CM | POA: Diagnosis not present

## 2017-10-02 DIAGNOSIS — N329 Bladder disorder, unspecified: Secondary | ICD-10-CM | POA: Diagnosis not present

## 2017-10-02 DIAGNOSIS — M2550 Pain in unspecified joint: Secondary | ICD-10-CM | POA: Diagnosis not present

## 2017-10-02 DIAGNOSIS — K567 Ileus, unspecified: Secondary | ICD-10-CM | POA: Diagnosis not present

## 2017-10-02 DIAGNOSIS — R4182 Altered mental status, unspecified: Secondary | ICD-10-CM | POA: Diagnosis not present

## 2017-10-02 DIAGNOSIS — E875 Hyperkalemia: Secondary | ICD-10-CM | POA: Diagnosis not present

## 2017-10-02 DIAGNOSIS — Z823 Family history of stroke: Secondary | ICD-10-CM | POA: Diagnosis not present

## 2017-10-02 DIAGNOSIS — T83511A Infection and inflammatory reaction due to indwelling urethral catheter, initial encounter: Secondary | ICD-10-CM | POA: Diagnosis not present

## 2017-10-02 DIAGNOSIS — E872 Acidosis: Secondary | ICD-10-CM | POA: Diagnosis not present

## 2017-10-02 DIAGNOSIS — R339 Retention of urine, unspecified: Secondary | ICD-10-CM | POA: Diagnosis not present

## 2017-10-02 DIAGNOSIS — G309 Alzheimer's disease, unspecified: Secondary | ICD-10-CM | POA: Diagnosis not present

## 2017-10-02 DIAGNOSIS — I5022 Chronic systolic (congestive) heart failure: Secondary | ICD-10-CM | POA: Diagnosis not present

## 2017-10-02 DIAGNOSIS — R319 Hematuria, unspecified: Secondary | ICD-10-CM | POA: Diagnosis not present

## 2017-10-02 DIAGNOSIS — R2681 Unsteadiness on feet: Secondary | ICD-10-CM | POA: Diagnosis not present

## 2017-10-02 DIAGNOSIS — R278 Other lack of coordination: Secondary | ICD-10-CM | POA: Diagnosis not present

## 2017-10-02 DIAGNOSIS — K560 Paralytic ileus: Secondary | ICD-10-CM | POA: Diagnosis not present

## 2017-10-02 DIAGNOSIS — I739 Peripheral vascular disease, unspecified: Secondary | ICD-10-CM | POA: Diagnosis not present

## 2017-10-02 DIAGNOSIS — R52 Pain, unspecified: Secondary | ICD-10-CM | POA: Diagnosis not present

## 2017-10-02 DIAGNOSIS — R488 Other symbolic dysfunctions: Secondary | ICD-10-CM | POA: Diagnosis not present

## 2017-10-02 DIAGNOSIS — R310 Gross hematuria: Secondary | ICD-10-CM | POA: Diagnosis not present

## 2017-10-02 DIAGNOSIS — R1312 Dysphagia, oropharyngeal phase: Secondary | ICD-10-CM | POA: Diagnosis not present

## 2017-10-02 DIAGNOSIS — R338 Other retention of urine: Secondary | ICD-10-CM | POA: Diagnosis not present

## 2017-10-02 DIAGNOSIS — I110 Hypertensive heart disease with heart failure: Secondary | ICD-10-CM | POA: Diagnosis not present

## 2017-10-02 DIAGNOSIS — Z7984 Long term (current) use of oral hypoglycemic drugs: Secondary | ICD-10-CM | POA: Diagnosis not present

## 2017-10-02 DIAGNOSIS — Z8701 Personal history of pneumonia (recurrent): Secondary | ICD-10-CM | POA: Diagnosis not present

## 2017-10-02 DIAGNOSIS — E1151 Type 2 diabetes mellitus with diabetic peripheral angiopathy without gangrene: Secondary | ICD-10-CM | POA: Diagnosis not present

## 2017-10-02 DIAGNOSIS — R1084 Generalized abdominal pain: Secondary | ICD-10-CM | POA: Diagnosis not present

## 2017-10-02 DIAGNOSIS — Z8249 Family history of ischemic heart disease and other diseases of the circulatory system: Secondary | ICD-10-CM | POA: Diagnosis not present

## 2017-10-02 DIAGNOSIS — Z7902 Long term (current) use of antithrombotics/antiplatelets: Secondary | ICD-10-CM | POA: Diagnosis not present

## 2017-10-02 DIAGNOSIS — N390 Urinary tract infection, site not specified: Secondary | ICD-10-CM | POA: Diagnosis not present

## 2017-10-02 DIAGNOSIS — E785 Hyperlipidemia, unspecified: Secondary | ICD-10-CM | POA: Diagnosis not present

## 2017-10-02 DIAGNOSIS — E118 Type 2 diabetes mellitus with unspecified complications: Secondary | ICD-10-CM | POA: Diagnosis not present

## 2017-10-02 DIAGNOSIS — E119 Type 2 diabetes mellitus without complications: Secondary | ICD-10-CM | POA: Diagnosis not present

## 2017-10-02 LAB — BASIC METABOLIC PANEL
ANION GAP: 12 (ref 5–15)
BUN: 11 mg/dL (ref 8–23)
CO2: 21 mmol/L — ABNORMAL LOW (ref 22–32)
Calcium: 8.1 mg/dL — ABNORMAL LOW (ref 8.9–10.3)
Chloride: 105 mmol/L (ref 98–111)
Creatinine, Ser: 0.74 mg/dL (ref 0.61–1.24)
GFR calc Af Amer: >60 mL/min (ref >60)
Glucose, Bld: 106 mg/dL — ABNORMAL HIGH (ref 70–99)
POTASSIUM: 4.1 mmol/L (ref 3.5–5.1)
SODIUM: 138 mmol/L (ref 135–145)

## 2017-10-02 LAB — GLUCOSE, CAPILLARY
Glucose-Capillary: 120 mg/dL — ABNORMAL HIGH (ref 70–99)
Glucose-Capillary: 148 mg/dL — ABNORMAL HIGH (ref 70–99)
Glucose-Capillary: 122 mg/dL — ABNORMAL HIGH (ref 70–99)

## 2017-10-02 MED ORDER — FUROSEMIDE 20 MG PO TABS: 20.0000 mg | ORAL_TABLET | ORAL | 0 refills | Status: AC

## 2017-10-02 MED ORDER — FAMOTIDINE 20 MG PO TABS: 20.0000 mg | ORAL_TABLET | Freq: Every day | ORAL | Status: DC

## 2017-10-02 NOTE — Discharge Summary (Signed)
Discharge Summary  [name]:[id number] DOB: March 23, 1935  PCP: Glendale Chard, MD  Admit date: 09/19/2017 Discharge date: 10/02/2017  Time spent: 35mins  Recommendations for Outpatient Follow-up:  1. F/u with SNF MD  for hospital discharge follow up, repeat cbc/bmp at follow up. 2. F/u with urology Dr Karsten Ro in two weeks for foley management. 3. Keep foley in, urology Dr Karsten Ro to decide on foley duration 4. Palliative care continue to follow patient while at SNF  Discharge Diagnoses:  Active Hospital Problems   Diagnosis Date Noted  . Hematuria 09/19/2017  . Abdominal distention   . AKI (acute kidney injury) ([place])   . Goals of care, counseling/discussion   . Adynamic ileus ([place])   . Palliative care encounter   . Pressure injury of skin 09/25/2017  . Gross hematuria 09/19/2017  . Chronic systolic CHF (congestive heart failure) ([place]) 09/06/2013  . Dementia 07/04/2012  . PVD (peripheral vascular disease) ([place]) 04/21/2012  . Hyperlipidemia 05/07/2007  . Essential hypertension 05/07/2007  . COPD COPD II  05/07/2007    Resolved Hospital Problems  No resolved problems to display.    Discharge Condition: stable  Diet recommendation: carb modified Diet recommendations: Dysphagia 3 (mechanical soft);Thin liquid Liquids provided via: Cup;No straw Medication Administration: Crushed with puree Supervision: Staff to assist with self feeding;Full supervision/cueing for compensatory strategies Compensations: Minimize environmental distractions;Slow rate;Small sips/bites Postural Changes and/or Swallow Maneuvers: Seated upright 90 degrees   Filed Weights   09/30/17 0500 10/01/17 0533 10/02/17 0521  Weight: 75.2 kg (165 lb 12.6 oz) 74.4 kg (164 lb 0.4 oz) 72.4 kg (159 lb 9.8 oz)    History of present illness: (per admitting MD Dr Herbert Moors) PCP: Glendale Chard, MD   Patient coming from: Home   Chief Complaint: Hematuria  HPI: [name] is a 83 y.o. male  with medical history significant of dementia, peripheral vascular disease status post right to left femorofemoral graft, to right iliac stents with most recent peripheral arteriogram on 07/14/2017 showing 50% stenosis in between right stents and 50% stenosis at the origin of the right to left femorofemoral bypass, chronic systolic heart failure with EF of 20 to 25% by echo on 09/05/2013 with grade 2 diastolic dysfunction, COPD, type 2 diabetes on oral hypoglycemics, hyperlipidemia, history of colon cancer status post resection remote who comes in with hematuria.  Patient's wife placed patient in respite care approximately 2 weeks ago.  He was initially doing well.  Proximal only 1 week ago wife noted some blood in patient's depends.  She initially attributed this to GI bleeding however patient's caretaker noted some blood at the 61s of the penis.  Patient also began to have episodes of gross hematuria.  Wife took patient to see PCP who initially try to obtain urine specimen but patient could not void.  Patient was sent to the ER where a urine specimen was obtained and patient was started on cephalexin.  Patient was then sent back to respite care and was noted to have gross hematuria that continued and they called his wife who called the PCP recommended evaluation in the emergency department.  The wife reports that the patient rarely complains but has been complaining of leg pain which due to his underlying dementia could be anything.  Not had any abdominal pain, fevers, nausea, vomiting, syncope/presyncope, cough, congestion, diarrhea.  ED Course: In the ED patient's vitals were notable for mild bradycardia and some hypertension.  Labs are notable for mildly elevated creatinine at 1.48.  Lactate  was elevated at 2.6.  UA showed gross hematuria along with moderate leukocytes, many bacteria and greater than 50 white blood cells.  CT renal stone protocol showed diffuse urinary bladder wall thickening concerning for  cystitis.  Urology was consulted and Dr. Karsten Ro has seen the patient.  Recommendations are pending.    Hospital Course:  Principal Problem:   Hematuria Active Problems:   Hyperlipidemia   COPD COPD II    Essential hypertension   PVD (peripheral vascular disease) ([facility])   Dementia   Chronic systolic CHF (congestive heart failure) ([facility])   Gross hematuria   Pressure injury of skin   Adynamic ileus ([facility])   Palliative care encounter   Goals of care, counseling/discussion   Abdominal distention   AKI (acute kidney injury) ([place])   Abdominal distention secondary to ileus -Patient was noted to have worsening abdominal distention on 7/1, NG tube was placed -CT abdomen showed worsening abdominal ileus with multiple air-fluid levels findings compatible with ileus -Serial abdominal x-rays were obtained, general surgery was also consulted. -On 7/7 overnight, patient pulled out his NGT and subsequently had 2 large bowel movements.  He became alert and oriented and was started on clear liquid diet. -Patient tolerating dysphagia 3 diet, surgery has signed off.   -Continue PT, increase ambulation, stool softeners   Active problems Gross hematuria in the setting of Plavix, perforated urinary bladder -Initially appeared to have resolved, urology was consulted.  Patient underwent CBI. -Foley catheter was discontinued on 6/29, subsequently had multiple episodes of urinary retention on 7/1, Foley catheter was reinserted. -Given patient continued to have abdominal pain, CT abdomen and pelvis was obtained which showed perforated urinary bladder -CT cystogram confirmed the presence of extraperitoneal bladder rupture with extravasation of contrast into the perivesical fat the prostate gland  -Urology following, continue Foley catheter, remain indwelling for 2 weeks, continue antibiotics while on catheter awaiting the healing of bladder rupture.  Patient will need cystoscopy and cystogram  outpatient.    Acute kidney injury with metabolic acidosis -Baseline creatinine around 1.1, 1.48 at the time of admission, plateaued at 2.09 -Creatinine at baseline, IV fluids discontinued   Leukocytosis -Resolved, likely due to #1 and #2, -he received  IV Rocephin -Cultures negative so far, change to Ceftin at discharge.  Acute metabolic encephalopathy on dementia likely due to acute illness -Much more alert and awake, oriented to person only, close to baseline per wife  History of PVD -Plavix  held in the hospital due to hematuria.   - Plavix resumed at the time of discharge.  Chronic systolic CHF -EF 20 to 37% per 2D echo in 08/2013 -was on lasix 20mg  daily prior to hospitalization. Lasix dose decreased to 20mg  MWF. Further adjustment of lasix per SNF MD.  Accelerated hypertension -BP improving,  -Discharged on oral Toprol-XL and hydralazine. Lasix MWF.  Diabetes mellitus type 2, NIDDM -CBG stable dysphagia 3 diet   COPD -No wheezing  Code Status: DNR DVT Prophylaxis:   SCD's Family Communication: Discussed in detail with patient's wife at bedside  Disposition Plan: SNF with palliative care following   Procedures:  Foley placement and CBI  Consultants:   Urology Palliative medicine General surgery  Antimicrobials:   IV Rocephin   Discharge Exam: BP (!) 145/67 (BP Location: Left Arm)   Pulse 73   Temp 98.9 F (37.2 C)   Resp 18   Ht 6\' 1"  (1.854 m)   Wt 72.4 kg (159 lb 9.8 oz)   SpO2 96%  BMI 21.06 kg/m   General: NAD, very pleasant, oriented to person only Cardiovascular: RRR Respiratory: diminished at basis  Discharge Instructions You were cared for by a hospitalist during your hospital stay. If you have any questions about your discharge medications or the care you received while you were in the hospital after you are discharged, you can call the unit and asked to speak with the hospitalist on call if the hospitalist that  took care of you is not available. Once you are discharged, your primary care physician will handle any further medical issues. Please note that NO REFILLS for any discharge medications will be authorized once you are discharged, as it is imperative that you return to your primary care physician (or establish a relationship with a primary care physician if you do not have one) for your aftercare needs so that they can reassess your need for medications and monitor your lab values.  Discharge Instructions    Diet general   Complete by:  As directed    Carb modified   Diet recommendations: Dysphagia 3 (mechanical soft);Thin liquid Liquids provided via: Cup;No straw Medication Administration: Crushed with puree Supervision: Staff to assist with self feeding;Full supervision/cueing for compensatory strategies Compensations: Minimize environmental distractions;Slow rate;Small sips/bites Postural Changes and/or Swallow Maneuvers: Seated upright 90 degrees   Increase activity slowly   Complete by:  As directed      Allergies as of 10/02/2017   No Known Allergies     Medication List    STOP taking these medications   cephALEXin 500 MG capsule Commonly known as:  KEFLEX   digoxin 0.125 MG tablet Commonly known as:  LANOXIN   IBUPROFEN PO   naproxen sodium 220 MG tablet Commonly known as:  ALEVE     TAKE these medications   acetaminophen 500 MG tablet Commonly known as:  TYLENOL Take 500 mg by mouth every 6 (six) hours as needed for mild pain.   albuterol (2.5 MG/3ML) 0.083% nebulizer solution Commonly known as:  PROVENTIL Take 2.5 mg by nebulization every 6 (six) hours as needed for wheezing or shortness of breath.   BREO ELLIPTA 100-25 MCG/INH Aepb Generic drug:  fluticasone furoate-vilanterol TAKE 1 PUFF BY MOUTH EVERY DAY   cefUROXime 500 MG tablet Commonly known as:  CEFTIN Take 1 tablet (500 mg total) by mouth 2 (two) times daily with a meal for 14 days.   clopidogrel  75 MG tablet Commonly known as:  PLAVIX TAKE 1 TABLET BY MOUTH DAILY. What changed:    how much to take  how to take this  when to take this   co-enzyme Q-10 30 MG capsule Take 30 mg by mouth at bedtime.   dextromethorphan-guaiFENesin 30-600 MG 12hr tablet Commonly known as:  MUCINEX DM Take 1 tablet by mouth 2 (two) times daily for 14 days.   famotidine 20 MG tablet Commonly known as:  PEPCID One at bedtime What changed:    how much to take  how to take this  when to take this  additional instructions   furosemide 20 MG tablet Commonly known as:  LASIX Take 1 tablet (20 mg total) by mouth every Monday, Wednesday, and Friday. What changed:  when to take this   hydrALAZINE 10 MG tablet Commonly known as:  APRESOLINE Take 1 tablet (10 mg total) by mouth 3 (three) times daily.   metoprolol tartrate 25 MG tablet Commonly known as:  LOPRESSOR Take 1 tablet (25 mg total) by mouth 2 (two) times daily.  What changed:  how much to take   multivitamin-iron-minerals-folic acid chewable tablet Chew 1 tablet by mouth at bedtime.   omega-3 acid ethyl esters 1 g capsule Commonly known as:  LOVAZA Take by mouth at bedtime.   ondansetron 4 MG tablet Commonly known as:  ZOFRAN Take 1 tablet (4 mg total) by mouth every 6 (six) hours as needed for nausea.   pravastatin 80 MG tablet Commonly known as:  PRAVACHOL Take 80 mg by mouth daily.   sitaGLIPtin 100 MG tablet Commonly known as:  JANUVIA Take 100 mg by mouth daily.      No Known Allergies  Contact information for follow-up providers    Call Kathie Rhodes, MD.   Specialty:  Urology Why:  For an appointment in ~2 weeks when you get home. Contact information: [place] [address] [phone]        Glendale Chard, MD. Schedule an appointment as soon as possible for a visit in 2 week(s).   Specialty:  Internal Medicine Contact information: [address] [place]  [address] [phone]            Contact information for after-discharge care    Destination    [facility] .   Service:  Skilled Nursing Contact information: [address] [place] [phone]                   The results of significant diagnostics from this hospitalization (including imaging, microbiology, ancillary and laboratory) are listed below for reference.    Significant Diagnostic Studies: Ct Abdomen Pelvis Wo Contrast  Result Date: 09/24/2017 CLINICAL DATA:  83 year old male with history of potential bowel obstruction. EXAM: CT ABDOMEN AND PELVIS WITHOUT CONTRAST TECHNIQUE: Multidetector CT imaging of the abdomen and pelvis was performed following the standard protocol without IV contrast. COMPARISON:  CT of the abdomen and pelvis 09/19/2017. FINDINGS: Lower chest: Emphysematous changes. Small right pleural effusion lying dependently. Atherosclerotic calcifications in the descending thoracic aorta as well as the left anterior descending and right coronary arteries. Nasogastric tube in the esophagus. Small hiatal hernia. Hepatobiliary: No definite cystic or solid hepatic lesions are confidently identified on today's noncontrast CT examination. Unenhanced appearance of the gallbladder is unremarkable. Pancreas: No definite pancreatic mass or peripancreatic fluid collections or inflammatory changes are noted on today's noncontrast CT examination. Spleen: Unremarkable. Adrenals/Urinary Tract: Unenhanced appearance of the adrenal glands and right kidney is unremarkable. 2 cm exophytic low-attenuation lesion extending off the medial aspect of the lower pole of the left kidney, incompletely characterized on today's noncontrast CT examination, but similar to prior studies, likely a tiny cyst. No hydroureteronephrosis. Urinary bladder is nearly decompressed around an indwelling Foley balloon catheter. There appears to be combination of  intramural gas in the wall of the urinary bladder, as well as some extraluminal gas adjacent to the urinary bladder both anteriorly (in the space of Retzius) and superiorly. This is best appreciated on axial images 62-70 and sagittal images 54-66. Inflammatory changes are also noted throughout the adjacent soft tissues where there is also small volume of intermediate attenuation fluid, which appears to be predominantly located in the space of Retzius. Stomach/Bowel: Nasogastric tube extends into the stomach. There multiple prominent borderline dilated loops of small bowel with multiple air-fluid levels. No pathologic dilatation of colon. Gas and stool is noted throughout the colon extending to the level of the rectum. Suture line in the rectum indicative of prior  partial proctocolectomy. Normal appendix. Vascular/Lymphatic: Aortic atherosclerosis. Fem-fem bypass graft incompletely evaluated on today's noncontrast CT examination. No lymphadenopathy noted in the abdomen or pelvis. Reproductive: Prostate gland and seminal vesicles are unremarkable in appearance. Other: Small volume of ascites. Trace volume of pneumoperitoneum adjacent to the urinary bladder, as discussed above. Musculoskeletal: Posterior orthopedic fixation hardware in place at L4-L5. Interbody graft at L4-L5. 13 mm of anterolisthesis of L4 upon L5. There are no aggressive appearing lytic or blastic lesions noted in the visualized portions of the skeleton. IMPRESSION: 1. Findings are highly concerning for perforated urinary bladder. This includes both intramural gas, extraluminal gas adjacent to the urinary bladder, and gas and fluid in the space of Retzius. 2. Small volume of ascites or urine noted in the peritoneal cavity. 3. Multiple prominent borderline dilated loops of small bowel with multiple air-fluid levels. Findings are not compatible with obstruction, but may reflect a mild ileus. 4. Aortic atherosclerosis, in addition to at least 2 vessel  coronary artery disease. 5. Small right pleural effusion lying dependently. 6. Emphysema. 7. Additional incidental findings, as above. These results will be called to the ordering clinician or representative by the Radiologist Assistant, and communication documented in the PACS or zVision Dashboard. Aortic Atherosclerosis (ICD10-I70.0) and Emphysema (ICD10-J43.9). Electronically Signed   By: Vinnie Langton M.D.   On: 09/24/2017 12:50   Ct Pelvis Wo Contrast  Result Date: 09/24/2017 CLINICAL DATA:  Suspected perforation of the urinary bladder on earlier noncontrast CT. Recent gross hematuria. History of diabetes, peripheral vascular disease and dementia. EXAM: CT PELVIS WITHOUT CONTRAST TECHNIQUE: Multidetector CT imaging of the pelvis was performed following the installation of 50 cc Omnipaque 300 diluted in 450 cc of saline through the indwelling Foley catheter. No intravenous contrast was administered. COMPARISON:  Noncontrast CT 09/19/2017 and 09/24/2017. FINDINGS: Urinary Tract: There is moderate anterior extravasation of contrast from the bladder into the prevesical fat. Contrast extends along the anterior abdominal wall, asymmetric to the left, admixed with extraluminal air. Contrast also extends into the prostate gland, asymmetric to the right. No contrast extension into the peritoneal cavity is seen. A Foley catheter is in place. There is mild bladder wall thickening and trabeculation. The distal ureters do not appear dilated. Bowel: Multiple borderline dilated loops of small bowel with air-fluid levels are again noted, most consistent with an ileus. There is a rectal anastomosis. The visualized colon and appendix otherwise appear normal. Vascular/Lymphatic: No enlarged pelvic lymph nodes identified. Extensive aortic and branch vessel atherosclerosis. Postsurgical changes in both groins from previous femoral-femoral bypass graft. Reproductive: Extravasated bladder contrast extends into the substance of  the prostate gland, asymmetric to the right. The seminal vesicles appear normal. Other: No free air or peritoneal contrast. Musculoskeletal: No evidence of pelvic fracture. Postsurgical changes in the lower lumbar spine and sacroiliac degenerative changes are noted. IMPRESSION: 1. CT cystogram confirms the presence of extraperitoneal bladder rupture with extravasation of contrast into the prevesical fat and prostate gland. No peritoneal contrast demonstrated. 2. No evidence of underlying pelvic fracture. 3. Probable small bowel ileus. 4.  Aortic Atherosclerosis (ICD10-I70.0). Electronically Signed   By: Richardean Sale M.D.   On: 09/24/2017 16:49   Dg Chest Port 1 View  Result Date: 09/21/2017 CLINICAL DATA:  Dyspnea. EXAM: PORTABLE CHEST 1 VIEW COMPARISON:  05/04/2017 and prior studies. FINDINGS: Cardiomediastinal silhouette is normal. Mediastinal contours appear intact. Calcific atherosclerotic disease of the aorta. There is no evidence of pleural effusion or pneumothorax. Low lung volumes. Multiple tiny hyperdense nodules  overlie bilateral lung bases, right greater than left. Osseous structures are without acute abnormality. Soft tissues are grossly normal. IMPRESSION: Multiple tiny hyperdense nodules overlie bilateral lung bases, right greater than left. These may represent pleural calcifications, pleuroparenchymal scarring or high density nodules along the bronchovascular tree of the lower lobes. Calcific atherosclerotic disease and tortuosity of the aorta. Electronically Signed   By: Fidela Salisbury M.D.   On: 09/21/2017 11:16   Dg Abd Acute W/chest  Result Date: 09/23/2017 CLINICAL DATA:  Abdominal distension EXAM: DG ABDOMEN ACUTE W/ 1V CHEST COMPARISON:  09/21/2017, CT 09/19/2017 FINDINGS: Single-view chest demonstrates mild basilar atelectasis. Stable cardiomediastinal silhouette with aortic atherosclerosis. Tiny calcified nodules at the lung bases as before. Supine and upright views of the  abdomen demonstrate no free air beneath the diaphragm. Development of multiple loops of dilated air-filled small bowel measuring up to 3.1 cm. Postsurgical changes in the pelvis. Right iliac stent. IMPRESSION: 1. No radiographic evidence for acute cardiopulmonary abnormality. 2. Development of multiple air-filled dilated loops of small bowel with some distal gas present, findings could be secondary to an ileus, however partial small bowel obstruction could also produce this appearance. Electronically Signed   By: Donavan Foil M.D.   On: 09/23/2017 01:39   Dg Abd Portable 1v-small Bowel Obstruction Protocol-initial, 8 Hr Delay  Result Date: 09/28/2017 CLINICAL DATA:  Small bowel obstruction, continued surveillance. EXAM: PORTABLE ABDOMEN - 1 VIEW COMPARISON:  09/26/2017.  09/27/2017. FINDINGS: Enteric contrast is now seen in the RIGHT colon, indicating a degree of improvement. Enteric contrast remains within mildly prominent small bowel loops indicating a degree of partial obstruction. NG tube tip in the stomach. Spinal fixation device spans the L4 and L5 segments. IMPRESSION: Slight improvement partial SBO. Electronically Signed   By: Staci Righter M.D.   On: 09/28/2017 07:27   Dg Abd Portable 1v  Result Date: 09/24/2017 CLINICAL DATA:  NG tube placement EXAM: PORTABLE ABDOMEN - 1 VIEW COMPARISON:  09/23/2017, CT 09/19/2017 FINDINGS: Lung bases are clear. Esophageal tube tip overlies the proximal stomach. Persistent gaseous enlargement of small bowel up to 3.2 cm. IMPRESSION: 1. Esophageal tube tip overlies the proximal stomach 2. Persistent gaseous enlargement of small bowel, suspect for bowel obstruction Electronically Signed   By: Donavan Foil M.D.   On: 09/24/2017 01:46   Dg Abd Portable 2v  Result Date: 09/27/2017 CLINICAL DATA:  Ileus EXAM: PORTABLE ABDOMEN - 2 VIEW COMPARISON:  CT abdomen and pelvis September 24, 2017 and abdominal radiographs September 26, 2017 FINDINGS: Nasogastric tube tip and side port are  in the proximal stomach. There is persistent relatively mild dilatation of multiple loops of small bowel without air-fluid levels. No free air. There is moderate stool in the colon. Lung bases are clear. There is postoperative change in the pelvis and lower lumbar spine regions. IMPRESSION: Nasogastric tube tip and side port in proximal stomach. Persistent loops of dilated small bowel, likely due to ileus or enteritis. Bowel obstruction felt to be less likely. No free air. Areas of postoperative change noted. Electronically Signed   By: Lowella Grip III M.D.   On: 09/27/2017 07:18   Dg Abd Portable 2v  Result Date: 09/26/2017 CLINICAL DATA:  83 year old male with recently diagnosed extraperitoneal bladder rupture. Ileus. EXAM: PORTABLE ABDOMEN - 2 VIEW COMPARISON:  CT Abdomen and Pelvis 09/24/2017 and earlier. FINDINGS: Portable AP supine and left lateral decubitus views at 1421 hours. No pneumoperitoneum. Enteric tube in place with side hole at the level of the  gastric cardia. Negative lung bases. Mildly improved bowel gas pattern since 09/24/2017 but multiple gas-filled small bowel loops remain at the upper limits of normal to mildly dilated. There is large bowel gas present. Lower lumbar interspinous implant and pelvic surgical clips redemonstrated. No acute osseous abnormality identified. IMPRESSION: 1. Mildly improved bowel gas pattern since 09/24/2017. Multiple gas-filled small bowel loops remain compatible with a degree of residual ileus or partial obstruction. 2. No free air. Electronically Signed   By: Genevie Ann M.D.   On: 09/26/2017 15:07   Ct Renal Stone Study  Result Date: 09/19/2017 CLINICAL DATA:  Gross hematuria.  Urinary retention. EXAM: CT ABDOMEN AND PELVIS WITHOUT CONTRAST TECHNIQUE: Multidetector CT imaging of the abdomen and pelvis was performed following the standard protocol without IV contrast. COMPARISON:  None. FINDINGS: Lower chest: No acute abnormality. Hepatobiliary: No focal  liver abnormality is seen. No gallstones, gallbladder wall thickening, or biliary dilatation. Pancreas: Unremarkable. No pancreatic ductal dilatation or surrounding inflammatory changes. Spleen: Normal in size without focal abnormality. Adrenals/Urinary Tract: Adrenal glands appear normal. Kidneys appear normal. No hydronephrosis or renal obstruction is noted. Mild diffuse wall thickening of urinary bladder is noted with small diverticulum seen superiorly. Blood is noted in the dependent portion of the urinary bladder lumen. Stomach/Bowel: Stomach is within normal limits. Appendix appears normal. No evidence of bowel wall thickening, distention, or inflammatory changes. Vascular/Lymphatic: Aortic atherosclerosis. No enlarged abdominal or pelvic lymph nodes. Reproductive: Prostate is unremarkable. Other: No abdominal wall hernia or abnormality. No abdominopelvic ascites. Musculoskeletal: Postsurgical and degenerative changes are noted in the lower lumbar spine. No acute abnormality is noted. IMPRESSION: No renal or ureteral calculi are noted.  No hydronephrosis is noted. Mild diffuse urinary bladder wall thickening is noted concerning for cystitis. Small bladder diverticulum is noted superiorly. Probable hemorrhage is noted within the urinary bladder lumen; cystoscopy is recommended to rule out possible neoplasm or malignancy. Aortic Atherosclerosis (ICD10-I70.0). Electronically Signed   By: Marijo Conception, M.D.   On: 09/19/2017 15:08    Microbiology: No results found for this or any previous visit (from the past 240 hour(s)).   Labs: Basic Metabolic Panel: Recent Labs  Lab 09/27/17 0438 09/28/17 9163 09/29/17 0415 09/30/17 0445 10/01/17 0437 10/02/17 0449  NA 136 136 137 137 137 138  K 3.3* 3.9 4.0 3.7 3.8 4.1  CL 96* 100 105 106 105 105  CO2 31 28 27 25 23  21*  GLUCOSE 164* 125* 123* 117* 103* 106*  BUN 10 10 10  6* 11 11  CREATININE 0.68 0.73 0.73 0.75 0.79 0.74  CALCIUM 7.7* 8.1* 7.9* 8.0*  8.0* 8.1*  MG 2.0  --   --   --   --   --    Liver Function Tests: Recent Labs  Lab 09/29/17 0415  AST 32  ALT 18  ALKPHOS 47  BILITOT 0.9  PROT 5.3*  ALBUMIN 2.2*   No results for input(s): LIPASE, AMYLASE in the last 168 hours. No results for input(s): AMMONIA in the last 168 hours. CBC: Recent Labs  Lab 09/27/17 0438 09/28/17 0632 09/29/17 0415  WBC 7.3 8.4 9.2  HGB 11.2* 11.4* 10.2*  HCT 34.7* 36.9* 32.9*  MCV 88.5 91.8 89.6  PLT 294 321 301   Cardiac Enzymes: No results for input(s): CKTOTAL, CKMB, CKMBINDEX, TROPONINI in the last 168 hours. BNP: BNP (last 3 results) No results for input(s): BNP in the last 8760 hours.  ProBNP (last 3 results) No results for input(s): PROBNP in the  last 8760 hours.  CBG: Recent Labs  Lab 10/01/17 1659 10/01/17 2209 10/01/17 2235 10/02/17 0741 10/02/17 1215  GLUCAP 155* 67* 83 120* 148*       Signed:  Florencia Reasons MD, PhD  [facility] 10/02/2017, 3:09 PM

## 2017-10-02 NOTE — Progress Notes (Signed)
Patient was discharged to nursing home [facility]) by MD order; discharged instructions review and sent to facility with care notes and prescriptions via [facility]; IV DIC; facility was called and report was given to the nurse who is going to receive the patient; patient will be transported to facility via [place].

## 2017-10-02 NOTE — Consult Note (Signed)
   [facility] CM Inpatient Consult   10/02/2017  APOLLO TIMOTHY October 23, 1934 [id number]    [facility] Care Management follow up.  Chart reviewed. Noted discharge plan is for [facility]. Will make referral to [place] for follow up.  Will also update [facility] team of disposition plans.   Marthenia Rolling, MSN-Ed, RN,BSN [facility] Liaison [phone]

## 2017-10-02 NOTE — Progress Notes (Signed)
Occupational Therapy Evaluation Patient Details Name: [name] MRN: [id number] DOB: Feb 25, 1935 Today's Date: 10/02/2017    History of Present Illness 83 y.o. male admitted on 09/19/17 for abdominal distension secondary to ileus (NGT placed initially), hematuria in the setting of plavix, peforated bladder pt underwent CBI, indwelling urinary catheter placed, acute kidney injury with metabolic acidosis, and accelerated HTN.  Pt with significant PMH of DM 2, PVD, COPD, CHF, anemia, Alzheimer's dementia, spine surgery, multiple LE bypass grafts on the R, and colon CA s/p resection.   Clinical Impression   Per pt's chart, patient's wife placed patient in respite care 2 weeks prior to admission. Due to diagnosis of dementia, pt is a poor historian of prior level of function and no family or caregiver present to determine pt's baseline. Pt currently requires maxA for functional mobility and ADLs. Pt required consistent verbal cues and extended time for processing for task completion during session. Due to deficits listed below (see OT problem list) recommend SNF follow-up. All additional OT needs to be addressed by next venue of care. OT to sign off.      Follow Up Recommendations  SNF;Supervision/Assistance - 24 hour    Equipment Recommendations  Other (comment)(TBD at next venue)    Recommendations for Other Services       Precautions / Restrictions Precautions Precautions: Fall Restrictions Weight Bearing Restrictions: No      Mobility Bed Mobility Overal bed mobility: Needs Assistance Bed Mobility: Rolling;Supine to Sit Rolling: Mod assist   Supine to sit: Mod assist;HOB elevated     General bed mobility comments: modA to progress BLE off bed and to position trunk upright  Transfers Overall transfer level: Needs assistance Equipment used: Rolling walker (2 wheeled) Transfers: Sit to/from [facility] Sit to Stand: Mod assist;From elevated surface Stand  pivot transfers: Max assist;From elevated surface       General transfer comment: Pt able to get to fully upright standing today with modA for power up. Pt needed support for balance,pt maxA to take 2-3 pivotal steps around to the chair with uncontrolled descent to sit in low seat.    Balance Overall balance assessment: Needs assistance Sitting-balance support: Feet supported;Bilateral upper extremity supported Sitting balance-Leahy Scale: Fair   Postural control: Posterior lean Standing balance support: Bilateral upper extremity supported Standing balance-Leahy Scale: Poor Standing balance comment: pt required maxA to take 2-3 pivotal steps and repositioning of RW                            ADL either performed or assessed with clinical judgement   ADL Overall ADL's : Needs assistance/impaired Eating/Feeding: Moderate assistance   Grooming: Moderate assistance;Cueing for sequencing   Upper Body Bathing: Minimal assistance;Cueing for sequencing   Lower Body Bathing: Moderate assistance   Upper Body Dressing : Moderate assistance   Lower Body Dressing: Maximal assistance   Toilet Transfer: Moderate assistance;Stand-pivot;RW Toilet Transfer Details (indicate cue type and reason): simulated with stand pivot from EOB to recliner Toileting- Clothing Manipulation and Hygiene: Moderate assistance       Functional mobility during ADLs: Moderate assistance;Rolling walker;Cueing for safety General ADL Comments: pt required extended time to process commands;pt required consistent VC for task completion     Vision         Perception     Praxis      Pertinent Vitals/Pain Pain Assessment: No/denies pain Faces Pain Scale: No hurt     Hand  Dominance     Extremity/Trunk Assessment Upper Extremity Assessment Upper Extremity Assessment: Generalized weakness   Lower Extremity Assessment Lower Extremity Assessment: Defer to PT evaluation   Cervical / Trunk  Assessment Cervical / Trunk Assessment: Kyphotic   Communication Communication Communication: HOH   Cognition Arousal/Alertness: Awake/alert Behavior During Therapy: WFL for tasks assessed/performed Overall Cognitive Status: History of cognitive impairments - at baseline                                 General Comments: Pt with history of Alzheimer's dementia; required extended time for processing;able to folllow one-step commands with increased time;pt poor historian, no family/caregiver present to determine PLOF   General Comments       Exercises     Shoulder Instructions      Home Living Family/patient expects to be discharged to:: Skilled nursing facility Living Arrangements: Spouse/significant other                               Additional Comments: Per chart, pt from facility      Prior Functioning/Environment Level of Independence: Needs assistance        Comments: pt unable to provide accurate history due to cognitive deficits, no caregiver present in room        OT Problem List: Decreased strength;Decreased range of motion;Decreased activity tolerance;Impaired balance (sitting and/or standing);Decreased coordination;Decreased cognition;Decreased safety awareness;Decreased knowledge of use of DME or AE      OT Treatment/Interventions: Self-care/ADL training;Therapeutic exercise;Energy conservation;DME and/or AE instruction;Therapeutic activities;Cognitive remediation/compensation;Patient/family education;Balance training    OT Goals(Current goals can be found in the care plan section) Acute Rehab OT Goals Patient Stated Goal: none stated OT Goal Formulation: Patient unable to participate in goal setting Time For Goal Achievement: 10/16/17 Potential to Achieve Goals: Fair  OT Frequency: Min 2X/week   Barriers to D/C:            Co-evaluation              AM-PAC PT "6 Clicks" Daily Activity     Outcome Measure Help from  another person eating meals?: A Lot Help from another person taking care of personal grooming?: A Lot Help from another person toileting, which includes using toliet, bedpan, or urinal?: A Lot Help from another person bathing (including washing, rinsing, drying)?: A Lot Help from another person to put on and taking off regular upper body clothing?: A Lot Help from another person to put on and taking off regular lower body clothing?: A Lot 6 Click Score: 12   End of Session Equipment Utilized During Treatment: Gait belt;Rolling walker Nurse Communication: Mobility status  Activity Tolerance: Patient tolerated treatment well Patient left: in chair;with call bell/phone within reach;with chair alarm set  OT Visit Diagnosis: Unsteadiness on feet (R26.81);Muscle weakness (generalized) (M62.81);Other symptoms and signs involving cognitive function                Time: 1453-1515 OT Time Calculation (min): 22 min Charges:    G-Codes:     Dorinda Hill OTS    Dorinda Hill 10/02/2017, 3:59 PM

## 2017-10-02 NOTE — Progress Notes (Signed)
Patient will DC to: [facility] Anticipated DC date: 10/02/17 Family notified: Spouse Transport by: Corey Harold   Per MD patient ready for DC to [facility]. RN, patient, patient's family, and facility notified of DC. Discharge Summary sent to facility. RN given number for report [phone]). DC packet on chart. Ambulance transport requested for patient.   CSW signing off.  Cedric Fishman, LCSW Clinical Social Worker [phone]

## 2017-10-02 NOTE — Progress Notes (Signed)
OT Note Addendum    10/02/17 1604  OT Visit Information  Last OT Received On 10/02/17  OT General Charges  $OT Visit 1 Visit  OT Evaluation  $OT Eval Moderate Complexity 1 Mod   [name] A. Ulice Brilliant, M.S., OTR/L Acute Rehab Department: [phone]

## 2017-10-02 NOTE — Clinical Social Work Placement (Signed)
   CLINICAL SOCIAL WORK PLACEMENT  NOTE  Date:  10/02/2017  Patient Details  Name: [name] MRN: [id number] Date of Birth: Mar 23, 1935  Clinical Social Work is seeking post-discharge placement for this patient at the [place] level of care (*CSW will initial, date and re-position this form in  chart as items are completed):  Yes   Patient/family provided with [facility] Work Department's list of facilities offering this level of care within the geographic area requested by the patient (or if unable, by the patient's family).  Yes   Patient/family informed of their freedom to choose among providers that offer the needed level of care, that participate in Medicare, Medicaid or managed care program needed by the patient, have an available bed and are willing to accept the patient.  Yes   Patient/family informed of [facility]'s ownership interest in [facility] and [facility], as well as of the fact that they are under no obligation to receive care at these facilities.  PASRR submitted to EDS on       PASRR number received on       Existing PASRR number confirmed on 09/23/17     FL2 transmitted to all facilities in geographic area requested by pt/family on 09/23/17     FL2 transmitted to all facilities within larger geographic area on       Patient informed that his/her managed care company has contracts with or will negotiate with certain facilities, including the following:        Yes   Patient/family informed of bed offers received.  Patient chooses bed at [facility]     Physician recommends and patient chooses bed at      Patient to be transferred to [facility] on 10/02/17.  Patient to be transferred to facility by [facility]     Patient family notified on 10/02/17 of transfer.  Name of family member notified:  Spouse     PHYSICIAN Please prepare priority discharge summary, including medications      Additional Comment:    _______________________________________________ Benard Halsted, [place] 10/02/2017, 12:26 PM

## 2017-10-02 NOTE — Progress Notes (Signed)
Patient's spouse has selected [facility] and will go there to sign admission paperwork. Insurance authorization updated and received. CSW provided spouse with directions to facility.   Percell Locus Aurelie Dicenzo LCSW [phone]

## 2017-10-02 NOTE — Progress Notes (Signed)
  Speech Language Pathology Treatment: Dysphagia  Patient Details Name: ROURKE MCQUITTY MRN: [id number] DOB: 02/14/1935 Today's Date: 10/02/2017 Time: 2836-6294 SLP Time Calculation (min) (ACUTE ONLY): 26 min  Assessment / Plan / Recommendation Clinical Impression  Pt continues to tolerate advanced diet of dysphagia 3, thin liquids with no overt s/s of aspiration; persisting prolonged mastication, but functional.  Pt is alert, following commands and able to feed himself using cup and spoon today with cues to initiate and persist through task.  Pt with baseline cough when SLP entered room; PO intake did not change frequency.  Discussed with Mrs. Wayson the nature of dementia-based dysphagias, fluctuating function based on overall medical condition and MS, and the inherent risk of aspiration.  She verbalizes understanding.    HPI HPI: 83 year old male admitted with gross hematuria, etiology unclear. Past medical history of dementia, peripheral vascular disease status post right to left femoral-femoral graft, right iliac stents, chronic systolic CHF, COPD, type 2 diabetes, history of colon cancer status post resection with hematuria. Assessed by SLP 12/11/13 with recommendation for dys 3/thin liquids. Referred for swallowing evaluation.      SLP Plan  Continue with current plan of care       Recommendations  Diet recommendations: Dysphagia 3 (mechanical soft);Thin liquid Liquids provided via: Cup;No straw Medication Administration: Crushed with puree Supervision: Staff to assist with self feeding;Full supervision/cueing for compensatory strategies Compensations: Minimize environmental distractions;Slow rate;Small sips/bites Postural Changes and/or Swallow Maneuvers: Seated upright 90 degrees                Oral Care Recommendations: Oral care BID Follow up Recommendations: Skilled Nursing facility SLP Visit Diagnosis: Dysphagia, unspecified (R13.10) Plan: Continue with current plan of  care       GO                Juan Quam Laurice 10/02/2017, 11:52 AM

## 2017-10-03 ENCOUNTER — Encounter: Payer: Self-pay | Admitting: Internal Medicine

## 2017-10-03 ENCOUNTER — Non-Acute Institutional Stay (SKILLED_NURSING_FACILITY): Payer: PPO | Admitting: Internal Medicine

## 2017-10-03 ENCOUNTER — Ambulatory Visit: Payer: Self-pay | Admitting: Pharmacist

## 2017-10-03 DIAGNOSIS — R140 Abdominal distension (gaseous): Secondary | ICD-10-CM | POA: Diagnosis not present

## 2017-10-03 DIAGNOSIS — R338 Other retention of urine: Secondary | ICD-10-CM

## 2017-10-03 DIAGNOSIS — I5022 Chronic systolic (congestive) heart failure: Secondary | ICD-10-CM | POA: Diagnosis not present

## 2017-10-03 DIAGNOSIS — K560 Paralytic ileus: Secondary | ICD-10-CM

## 2017-10-03 DIAGNOSIS — N329 Bladder disorder, unspecified: Secondary | ICD-10-CM | POA: Diagnosis not present

## 2017-10-03 DIAGNOSIS — I739 Peripheral vascular disease, unspecified: Secondary | ICD-10-CM | POA: Diagnosis not present

## 2017-10-03 DIAGNOSIS — E872 Acidosis, unspecified: Secondary | ICD-10-CM

## 2017-10-03 DIAGNOSIS — I10 Essential (primary) hypertension: Secondary | ICD-10-CM

## 2017-10-03 DIAGNOSIS — N179 Acute kidney failure, unspecified: Secondary | ICD-10-CM | POA: Diagnosis not present

## 2017-10-03 DIAGNOSIS — F0150 Vascular dementia without behavioral disturbance: Secondary | ICD-10-CM | POA: Diagnosis not present

## 2017-10-03 DIAGNOSIS — G9341 Metabolic encephalopathy: Secondary | ICD-10-CM

## 2017-10-03 DIAGNOSIS — R310 Gross hematuria: Secondary | ICD-10-CM | POA: Diagnosis not present

## 2017-10-03 DIAGNOSIS — E785 Hyperlipidemia, unspecified: Secondary | ICD-10-CM

## 2017-10-03 NOTE — Progress Notes (Signed)
:  Nursing Home Room Number: 573U Place of Service:  SNF (31)  Noah Delaine. Sheppard Coil, MD  Patient Care Team: Glendale Chard, MD as PCP - General (Internal Medicine) Tanda Rockers, MD as Attending Physician (Pulmonary Disease) Adrian Prows, MD as Attending Physician (Cardiology) [place]ale Chard, MD as Referring Physician (Internal Medicine) Elayne Guerin, [facility] as [place] Management (Pharmacist) Adaline Sill, CPhT as [place] Management (Pharmacy Technician)  Extended Emergency Contact Information Primary Emergency Contact: [facility] Address: [address]          [place], [place] [address] Johnnette Litter of [place] Phone: [phone] Mobile Phone: [phone] Relation: Spouse Secondary Emergency Contact: Myra Gianotti Mobile Phone: [phone] Relation: Grandaughter     Allergies: Patient has no known allergies.  Chief Complaint  Patient presents with  . New Admit To SNF    Admit to [facility]    HPI: Patient is 83 y.o. male with dementia, peripheral vascular disease status post right to left femoral-femoral graft to right iliac stents with 50% peripheral vascular stenosis on last arteriogram, chronic systolic heart failure with EF 20 to 25% with grade 2 diastolic dysfunction, COPD, type 2 diabetes on oral hypoglycemics, hyperlipidemia, history of colon cancer status post resection remote who presented to [facility] ED with hematuria.  Patient been seen in the ED with gross hematuria and sent home with Keflex, on the second presentation to ED with hematuria patient was admitted to [facility] from 6/27-7/10.  CT stone protocol showed urinary bladder wall thickening concerning for cystitis.  Patient was subsequently found to have a perforated urinary bladder so Foley catheter was placed to stay in for 2 weeks while continuing on antibiotics awaiting healing of the rupture.  Hospital course was further complicated by  abdominal distention which turned out to be an ileus which was treated with an NG tube.  On the night of 7/7 patient pulled out his NG tube, subsequently had 2 large bowel movements and became alert and oriented was started on clear liquid diet.  Patient is acute kidney injury with metabolic acidosis resolved with IV fluids and his metabolic encephalopathy secondary to dementia and acute illness improved after having bowel movements.  Patient had no problems with his history of peripheral vascular disease, although his Plavix was held during his episodes of hematuria and he also had no problems with his systolic congestive heart failure.  Patient did have problems with his blood pressure and a new blood pressure medication was added.  Patient admitted to skilled nursing facility for OT/PT.  While in skilled nursing facility patient will be followed for peripheral vascular disease treated with Plavix, chronic systolic congestive heart failure treated with Lasix Monday Wednesday Friday and Lopressor 25 mg twice daily and hyperlipidemia treated with Pravachol.  Past Medical History:  Diagnosis Date  . Alzheimer's dementia 2013  . Anemia    HX OF ANEMIA  . CHF (congestive heart failure) ([place])   . Colon cancer ([place])   . COPD (chronic obstructive pulmonary disease) ([place])   . Depression   . Dyspnea   . Hyperlipidemia   . Hypertension   . Pneumonia 2014  . PVD (peripheral vascular disease) ([place])   . Type II diabetes mellitus ([place])     Past Surgical History:  Procedure Laterality Date  . ABDOMINAL ANGIOGRAM N/A 04/20/2013   Procedure: ABDOMINAL ANGIOGRAM;  Surgeon: Angelia Mould, MD;  Location[facility] CATH LAB;  Service: Cardiovascular;  Laterality: N/A;  .  ABDOMINAL AORTOGRAM W/LOWER EXTREMITY N/A 11/20/2016   Procedure: ABDOMINAL AORTOGRAM W/LOWER EXTREMITY;  Surgeon: Serafina Mitchell, MD;  Location: [facility] CV LAB;  Service: Cardiovascular;  Laterality: N/A;  . CATARACT EXTRACTION Right  02/2013  . CATARACT EXTRACTION W/ INTRAOCULAR LENS IMPLANT Left 01/2013  . COLON RESECTION  1997   /enc. notes 05/17/2004 (04/28/2013)  . COLON SURGERY  1996   cancer  . ESOPHAGOGASTRODUODENOSCOPY N/A 09/05/2013   Procedure: ESOPHAGOGASTRODUODENOSCOPY (EGD);  Surgeon: Lear Ng, MD;  Location: [facility] ENDOSCOPY;  Service: Endoscopy;  Laterality: N/A;  . FEMORAL ARTERY - FEMORAL ARTERY BYPASS GRAFT     right to left/enc. notes 05/17/2004 (04/28/2013)  . FEMORAL BYPASS Right    /enc. notes 05/17/2004 (04/28/2013)  . FEMORAL-POPLITEAL BYPASS GRAFT Right 04/29/2013   Procedure:  FEMORAL-POPLITEAL ARTERY Bypass Graft with intraoperative ultrasound and arteriogarm;  Surgeon: Mal Misty, MD;  Location: [place];  Service: Vascular;  Laterality: Right;  . FIBEROPTIC BRONCHOSCOPY     Yvette Rack. notes 03/16/2005 (04/28/2013)  . INTRAOPERATIVE ARTERIOGRAM Right 04/29/2013   Procedure: INTRA OPERATIVE ARTERIOGRAM;  Surgeon: Mal Misty, MD;  Location: [place];  Service: Vascular;  Laterality: Right;  . LOWER EXTREMITY ANGIOGRAM  04/20/2013   Procedure: LOWER EXTREMITY ANGIOGRAM;  Surgeon: Angelia Mould, MD;  Location: [facility] CATH LAB;  Service: Cardiovascular;;  . LOWER EXTREMITY ANGIOGRAPHY Bilateral 06/25/2017   Procedure: LOWER EXTREMITY ANGIOGRAPHY;  Surgeon: Serafina Mitchell, MD;  Location: [place] CV LAB;  Service: Cardiovascular;  Laterality: Bilateral;  . [place] SURGERY  2005  . PERCUTANEOUS STENT INTERVENTION  04/20/2013   Procedure: PERCUTANEOUS STENT INTERVENTION;  Surgeon: Angelia Mould, MD;  Location: [facility] CATH LAB;  Service: Cardiovascular;;  right common iliac artery  . PERIPHERAL VASCULAR CATHETERIZATION N/A 01/31/2016   Procedure: Abdominal Aortogram w/Lower Extremity;  Surgeon: Serafina Mitchell, MD;  Location: [place] CV LAB;  Service: Cardiovascular;  Laterality: N/A;  . PERIPHERAL VASCULAR CATHETERIZATION Right 01/31/2016   Procedure: Peripheral Vascular Atherectomy;  Surgeon:  Serafina Mitchell, MD;  Location: [place] CV LAB;  Service: Cardiovascular;  Laterality: Right;  Iliac common and external  . PERIPHERAL VASCULAR CATHETERIZATION Right 01/31/2016   Procedure: Peripheral Vascular Intervention;  Surgeon: Serafina Mitchell, MD;  Location: [place] CV LAB;  Service: Cardiovascular;  Laterality: Right;  external iliac and common iliac  . PERIPHERAL VASCULAR CATHETERIZATION N/A 04/24/2016   Procedure: Abdominal Aortogram w/Lower Extremity;  Surgeon: Serafina Mitchell, MD;  Location: [place] CV LAB;  Service: Cardiovascular;  Laterality: N/A;  . PERIPHERAL VASCULAR CATHETERIZATION Right 04/24/2016   Procedure: Peripheral Vascular Intervention;  Surgeon: Serafina Mitchell, MD;  Location: [place] CV LAB;  Service: Cardiovascular;  Laterality: Right;  external illiac Rt  . SPINE SURGERY    . VASCULAR SURGERY      Allergies as of 10/03/2017   No Known Allergies     Medication List        Accurate as of 10/03/17  9:58 AM. Always use your most recent med list.          acetaminophen 500 MG tablet Commonly known as:  TYLENOL Take 500 mg by mouth every 6 (six) hours as needed for mild pain.   albuterol (2.5 MG/3ML) 0.083% nebulizer solution Commonly known as:  PROVENTIL Take 2.5 mg by nebulization every 6 (six) hours as needed for wheezing or shortness of breath.   BREO ELLIPTA 100-25 MCG/INH Aepb Generic drug:  fluticasone furoate-vilanterol TAKE 1 PUFF  BY MOUTH EVERY DAY   cefUROXime 500 MG tablet Commonly known as:  CEFTIN Take 1 tablet (500 mg total) by mouth 2 (two) times daily with a meal for 14 days.   clopidogrel 75 MG tablet Commonly known as:  PLAVIX TAKE 1 TABLET BY MOUTH DAILY.   co-enzyme Q-10 30 MG capsule Take 30 mg by mouth at bedtime.   dextromethorphan-guaiFENesin 30-600 MG 12hr tablet Commonly known as:  MUCINEX DM Take 1 tablet by mouth 2 (two) times daily for 14 days.   famotidine 20 MG tablet Commonly known as:  PEPCID One  at bedtime   furosemide 20 MG tablet Commonly known as:  LASIX Take 1 tablet (20 mg total) by mouth every Monday, Wednesday, and Friday.   hydrALAZINE 10 MG tablet Commonly known as:  APRESOLINE Take 1 tablet (10 mg total) by mouth 3 (three) times daily.   metoprolol tartrate 25 MG tablet Commonly known as:  LOPRESSOR Take 1 tablet (25 mg total) by mouth 2 (two) times daily.   multivitamin-iron-minerals-folic acid chewable tablet Chew 1 tablet by mouth at bedtime.   omega-3 acid ethyl esters 1 g capsule Commonly known as:  LOVAZA Take by mouth at bedtime.   ondansetron 4 MG tablet Commonly known as:  ZOFRAN Take 1 tablet (4 mg total) by mouth every 6 (six) hours as needed for nausea.   pravastatin 80 MG tablet Commonly known as:  PRAVACHOL Take 80 mg by mouth daily.   sitaGLIPtin 100 MG tablet Commonly known as:  JANUVIA Take 100 mg by mouth daily.       No orders of the defined types were placed in this encounter.   Immunization History  Administered Date(s) Administered  . Influenza Whole 12/25/2010, 12/24/2016  . Influenza, High Dose Seasonal PF 02/10/2014, 12/28/2015  . Influenza,inj,Quad PF,6+ Mos 01/27/2014, 12/22/2014  . Influenza-Unspecified 11/24/2012  . Pneumococcal Conjugate-13 09/23/2013  . Pneumococcal-Unspecified 09/04/1999    Social History   Tobacco Use  . Smoking status: Former Smoker    Packs/day: 0.30    Years: 40.00    Pack years: 12.00    Types: Cigarettes    Last attempt to quit: 03/26/1993    Years since quitting: 24.5  . Smokeless tobacco: Never Used  Substance Use Topics  . Alcohol use: No    Family history is   Family History  Problem Relation Age of Onset  . Stroke Mother 49  . Hypertension Mother   . Cancer Sister   . Heart disease Sister   . Heart disease Brother        Heart Disease before age 56  . Heart attack Brother   . Cancer Sister   . Cancer Brother       Review of Systems  DATA OBTAINED: from  patient, nurse GENERAL:  no fevers, fatigue, appetite changes SKIN: No itching, or rash EYES: No eye pain, redness, discharge EARS: No earache, tinnitus, change in hearing NOSE: No congestion, drainage or bleeding  MOUTH/THROAT: No mouth or tooth pain, No sore throat RESPIRATORY: No cough, wheezing, SOB CARDIAC: No chest pain, palpitations, lower extremity edema  GI: No abdominal pain, No N/V/D or constipation, No heartburn or reflux  GU: No dysuria, frequency or urgency, or incontinence  MUSCULOSKELETAL: No unrelieved bone/joint pain NEUROLOGIC: No headache, dizziness or focal weakness PSYCHIATRIC: No c/o anxiety or sadness   Vitals:   10/03/17 0956  BP: 133/65  Pulse: 60  Resp: 17  Temp: 98.6 F (37 C)    SpO2  Readings from Last 1 Encounters:  10/02/17 96%   Body mass index is 21.11 kg/m.     Physical Exam  GENERAL APPEARANCE: Alert, conversant,  No acute distress.  SKIN: No diaphoresis rash HEAD: Normocephalic, atraumatic  EYES: Conjunctiva/lids clear. Pupils round, reactive. EOMs intact.  EARS: External exam WNL, canals clear. Hearing grossly normal.  NOSE: No deformity or discharge.  MOUTH/THROAT: Lips w/o lesions  RESPIRATORY: Breathing is even, unlabored. Lung sounds are clear   CARDIOVASCULAR: Heart RRR no murmurs, rubs or gallops. No peripheral edema.   GASTROINTESTINAL: Abdomen is soft, non-tender, not distended w/ normal bowel sounds. GENITOURINARY: Bladder non tender, not distended  MUSCULOSKELETAL: No abnormal joints or musculature NEUROLOGIC:  Cranial nerves 2-12 grossly intact. Moves all extremities  PSYCHIATRIC: Mood and affect with dementia, no behavioral issues  Patient Active Problem List   Diagnosis Date Noted  . Abdominal distention   . AKI (acute kidney injury) ([place])   . Goals of care, counseling/discussion   . Adynamic ileus ([place])   . Palliative care encounter   . Pressure injury of skin 09/25/2017  . Hematuria 09/19/2017  . Gross  hematuria 09/19/2017  . Sepsis ([place]) 05/02/2017  . Diabetes mellitus ([place]) 12/25/2013  . Type I (juvenile type) diabetes mellitus with peripheral circulatory disorders, not stated as uncontrolled(250.71) 12/18/2013  . Acute DVT (deep venous thrombosis) ([place]) 12/12/2013  . PNA (pneumonia) 12/11/2013  . Candida esophagitis ([place]) 09/06/2013  . Chronic systolic CHF (congestive heart failure) ([place]) 09/06/2013  . CAP (community acquired pneumonia) 09/05/2013  . COPD exacerbation ([place]) 09/04/2013  . Microcytic anemia 09/03/2013  . Chronic respiratory failure with hypoxia ([place]) 09/01/2013  . Symptomatic anemia 09/01/2013  . DOE (dyspnea on exertion) 09/01/2013  . Atherosclerosis of native arteries of the extremities with ulceration ([place]) 06/24/2013  . PAD (peripheral artery disease) ([place]) 04/28/2013  . Chest pain 03/23/2013  . Nausea with vomiting 03/23/2013  . Pain in joint, ankle and foot 03/23/2013  . Dementia 07/04/2012  . URI (upper respiratory infection) 07/04/2012  . PVD (peripheral vascular disease) ([place]) 04/21/2012  . Leg pain 04/18/2012  . Upper airway cough syndrome 06/02/2007  . Hyperlipidemia 05/07/2007  . PVD 05/07/2007  . COPD COPD II  05/07/2007  . DYSPNEA 05/07/2007  . Essential hypertension 05/07/2007      Labs reviewed: Basic Metabolic Panel:    Component Value Date/Time   NA 138 10/02/2017 0449   K 4.1 10/02/2017 0449   CL 105 10/02/2017 0449   CO2 21 (L) 10/02/2017 0449   GLUCOSE 106 (H) 10/02/2017 0449   BUN 11 10/02/2017 0449   CREATININE 0.74 10/02/2017 0449   CREATININE 1.13 07/12/2015 1408   CALCIUM 8.1 (L) 10/02/2017 0449   PROT 5.3 (L) 09/29/2017 0415   ALBUMIN 2.2 (L) 09/29/2017 0415   AST 32 09/29/2017 0415   ALT 18 09/29/2017 0415   ALKPHOS 47 09/29/2017 0415   BILITOT 0.9 09/29/2017 0415   GFRNONAA >60 10/02/2017 0449   GFRAA >60 10/02/2017 0449    Recent Labs    09/25/17 1322  09/27/17 0438  09/30/17 0445 10/01/17 0437  10/02/17 0449  NA  --    < > 136   < > 137 137 138  K  --    < > 3.3*   < > 3.7 3.8 4.1  CL  --    < > 96*   < > 106 105 105  CO2  --    < > 31   < > 25  23 21*  GLUCOSE  --    < > 164*   < > 117* 103* 106*  BUN  --    < > 10   < > 6* 11 11  CREATININE  --    < > 0.68   < > 0.75 0.79 0.74  CALCIUM  --    < > 7.7*   < > 8.0* 8.0* 8.1*  MG 2.1  --  2.0  --   --   --   --    < > = values in this interval not displayed.   Liver Function Tests: Recent Labs    05/02/17 2032 09/19/17 1038 09/29/17 0415  AST 19 26 32  ALT 10* 12 18  ALKPHOS 71 64 47  BILITOT 0.7 0.8 0.9  PROT 6.4* 7.4 5.3*  ALBUMIN 3.1* 3.7 2.2*   No results for input(s): LIPASE, AMYLASE in the last 8760 hours. No results for input(s): AMMONIA in the last 8760 hours. CBC: Recent Labs    05/12/17 1649  09/19/17 1038  09/22/17 0415  09/27/17 0438 09/28/17 0632 09/29/17 0415  WBC 7.5   < > 8.5   < > 16.1*   < > 7.3 8.4 9.2  NEUTROABS 4.6  --  5.4  --  13.4*  --   --   --   --   HGB 12.6*   < > 13.6   < > 11.5*   < > 11.2* 11.4* 10.2*  HCT 38.4*   < > 43.6   < > 37.0*   < > 34.7* 36.9* 32.9*  MCV 87.7   < > 90.6   < > 90.5   < > 88.5 91.8 89.6  PLT 365   < > 384   < > 296   < > 294 321 301   < > = values in this interval not displayed.   Lipid No results for input(s): CHOL, HDL, LDLCALC, TRIG in the last 8760 hours.  Cardiac Enzymes: No results for input(s): CKTOTAL, CKMB, CKMBINDEX, TROPONINI in the last 8760 hours. BNP: No results for input(s): BNP in the last 8760 hours. No results found for: [facility] Lab Results  Component Value Date   HGBA1C 6.4 (H) 03/23/2013   Lab Results  Component Value Date   TSH 0.94 06/04/2014   Lab Results  Component Value Date   FVCBSWHQ75 916 09/30/2007   No results found for: FOLATE Lab Results  Component Value Date   IRON <10 (L) 09/01/2013   TIBC Not calculated due to Iron <10. 09/01/2013   FERRITIN 8 (L) 09/01/2013    Imaging and Procedures obtained  prior to SNF admission: Ct Renal Stone Study  Result Date: 09/19/2017 CLINICAL DATA:  Gross hematuria.  Urinary retention. EXAM: CT ABDOMEN AND PELVIS WITHOUT CONTRAST TECHNIQUE: Multidetector CT imaging of the abdomen and pelvis was performed following the standard protocol without IV contrast. COMPARISON:  None. FINDINGS: Lower chest: No acute abnormality. Hepatobiliary: No focal liver abnormality is seen. No gallstones, gallbladder wall thickening, or biliary dilatation. Pancreas: Unremarkable. No pancreatic ductal dilatation or surrounding inflammatory changes. Spleen: Normal in size without focal abnormality. Adrenals/Urinary Tract: Adrenal glands appear normal. Kidneys appear normal. No hydronephrosis or renal obstruction is noted. Mild diffuse wall thickening of urinary bladder is noted with small diverticulum seen superiorly. Blood is noted in the dependent portion of the urinary bladder lumen. Stomach/Bowel: Stomach is within normal limits. Appendix appears normal. No evidence of bowel wall thickening, distention, or inflammatory changes.  Vascular/Lymphatic: Aortic atherosclerosis. No enlarged abdominal or pelvic lymph nodes. Reproductive: Prostate is unremarkable. Other: No abdominal wall hernia or abnormality. No abdominopelvic ascites. Musculoskeletal: Postsurgical and degenerative changes are noted in the lower lumbar spine. No acute abnormality is noted. IMPRESSION: No renal or ureteral calculi are noted.  No hydronephrosis is noted. Mild diffuse urinary bladder wall thickening is noted concerning for cystitis. Small bladder diverticulum is noted superiorly. Probable hemorrhage is noted within the urinary bladder lumen; cystoscopy is recommended to rule out possible neoplasm or malignancy. Aortic Atherosclerosis (ICD10-I70.0). Electronically Signed   By: Marijo Conception, M.D.   On: 09/19/2017 15:08     Not all labs, radiology exams or other studies done during hospitalization come through on my  EPIC note; however they are reviewed by me.    Assessment and Plan  Gross hematuria/perforated urinary bladder/acute urinary retention- CT abdomen pelvis showed perforated urinary bladder with CT cystogram confirming bladder rupture with extravasation of contrast; urology recommending Foley catheter for 2 weeks while on Keflex and follow-up outpatient urology; hematuria resolved SNF -admitted for OT/PT; Ceftin 500 mg twice daily for 14 days  Acute kidney injury/metabolic acidosis- baseline creatinine 1.1, creatinine 1.48 at time of admission and plateaued at 2.09; back down to baseline with IV fluids SNF -BMP in 5 days  Abdominal distention/ileus- CT abdomen showed abdominal ileus with multiple air-fluid level findings general surgery was consulted serial abdominal x-rays were obtained; on the night of 7/7 patient pulled out his NG tube, subsequently had 2 large bowel movements and became more alert and oriented and was able to be started on clear liquid diet SNF -continue dysphagia 3 diet with thin liquids  Acute metabolic encephalopathy/dementia-resolved with treatments SNF -T supportive care  Peripheral vascular disease SNF -restart Plavix 75 mg daily  Chronic systolic congestive heart failure-EF 20 to 25% in 2015-Lasix decreased to 20 mg Monday Wednesday Friday SNF -continue Monday Wednesday Friday Lasix for now; continue metoprolol 25 mg twice daily  Accelerated hypertension- hydralazine was added to patient's Toprol-XL and Lasix SNF -tube; continue Toprol-XL 25 mg twice daily, hydralazine 10 mg 3 times daily and Lasix 20 mg Monday Wednesday Friday  Hyperlipidemia SNF -no status uncontrolled; continue Zocor 80 mg daily    Time spent greater than 45 minutes;> 50% of time with patient was spent reviewing records, labs, tests and studies, counseling and developing plan of care  Webb Silversmith D. Sheppard Coil, MD

## 2017-10-05 ENCOUNTER — Encounter: Payer: Self-pay | Admitting: Internal Medicine

## 2017-10-05 DIAGNOSIS — R338 Other retention of urine: Secondary | ICD-10-CM | POA: Insufficient documentation

## 2017-10-05 DIAGNOSIS — G9341 Metabolic encephalopathy: Secondary | ICD-10-CM | POA: Insufficient documentation

## 2017-10-05 DIAGNOSIS — I10 Essential (primary) hypertension: Secondary | ICD-10-CM | POA: Insufficient documentation

## 2017-10-05 DIAGNOSIS — E872 Acidosis, unspecified: Secondary | ICD-10-CM | POA: Insufficient documentation

## 2017-10-05 DIAGNOSIS — N329 Bladder disorder, unspecified: Secondary | ICD-10-CM | POA: Insufficient documentation

## 2017-10-07 ENCOUNTER — Other Ambulatory Visit: Payer: Self-pay | Admitting: Licensed Clinical Social Worker

## 2017-10-07 NOTE — Patient Outreach (Signed)
[facility]) Care Management  10/07/2017  DUGAN VANHOESEN 1934/06/19 [id number]  [facility] CSW arrived at [facility] SNF to complete [facility] Consult after receiving new request to follow patient on 10/03/17. [facility] CSW arrived at patient's room and introduced self, reason for visit and of [place]. Patient was already active with [facility] prior to SNF admission. Patient provided HIPPA verifications but unable to state his home address. Patient reports that he is "feeling good" and has not been experiencing any pain or discomfort. Patient shares that his appetite continues to remain normal. Patient has an active consent on file and patient gave [facility] CSW permission to contact his spouse as patient is unable to answer certain questions when questioned. [facility] CSW was able to reach spouse successfully. Spouse provided HIPPA verifications as well. Spouse reports that she feels that she is unable to care of patient at home anymore as she is 49 and deals with health complications as well. Spouse reports that she has visited patient at SNF daily since he arrived but did not feel up to it today because she felt weak. Spouse was provided SNF social worker's contact information in order to start communication in terms of LTC placement at facility. [facility] CSW educated spouse on the LTC placement process as well. Spouse shares that patient will have to apply for Medicaid in order to be placed long term at facility as they are unable to afford cost. [facility] CSW provided education on how to apply for LTC Medicaid. Spouse informed that she will need to go to DSS as soon as possible to apply for Medicaid on patient's behalf. Spouse expressed understanding. [facility] CSW met with SNF social worker Cristy and informed her that family is wanting LTC placement. No other updates provided.   [facility] CSW will follow up with SNF within two weeks and will follow patient and provide social work assistance with  the LTC placement process.  [name], BSW, MSW, [email or web address] Phone: [phone] Fax: [phone]

## 2017-10-14 ENCOUNTER — Other Ambulatory Visit: Payer: Self-pay | Admitting: Licensed Clinical Social Worker

## 2017-10-14 NOTE — Patient Outreach (Signed)
[facility]) Care Management  10/14/2017  [name] 21-Oct-1934 [id number]  Secure email sent to [facility] SNF social worker in order to gain discharge updates on patient. [facility] CSW will complete PAC Consult within two weeks and will await for updates from SNF.  [name], BSW, MSW, [email or web address] Phone: [phone] Fax: [phone]

## 2017-10-15 ENCOUNTER — Emergency Department (HOSPITAL_COMMUNITY): Payer: PPO

## 2017-10-15 ENCOUNTER — Encounter (HOSPITAL_COMMUNITY): Payer: Self-pay | Admitting: Family Medicine

## 2017-10-15 ENCOUNTER — Inpatient Hospital Stay (HOSPITAL_COMMUNITY)
Admission: EM | Admit: 2017-10-15 | Discharge: 2017-10-18 | DRG: 683 | Disposition: A | Discharge status: Skilled Nursing Facility | Payer: PPO | Attending: Family Medicine | Admitting: Family Medicine

## 2017-10-15 ENCOUNTER — Other Ambulatory Visit: Payer: Self-pay | Admitting: Licensed Clinical Social Worker

## 2017-10-15 ENCOUNTER — Other Ambulatory Visit: Payer: Self-pay

## 2017-10-15 DIAGNOSIS — K567 Ileus, unspecified: Secondary | ICD-10-CM | POA: Diagnosis not present

## 2017-10-15 DIAGNOSIS — Z87891 Personal history of nicotine dependence: Secondary | ICD-10-CM

## 2017-10-15 DIAGNOSIS — Z8249 Family history of ischemic heart disease and other diseases of the circulatory system: Secondary | ICD-10-CM | POA: Diagnosis not present

## 2017-10-15 DIAGNOSIS — Z8701 Personal history of pneumonia (recurrent): Secondary | ICD-10-CM | POA: Diagnosis not present

## 2017-10-15 DIAGNOSIS — R05 Cough: Secondary | ICD-10-CM | POA: Diagnosis not present

## 2017-10-15 DIAGNOSIS — I5022 Chronic systolic (congestive) heart failure: Secondary | ICD-10-CM | POA: Diagnosis present

## 2017-10-15 DIAGNOSIS — F0280 Dementia in other diseases classified elsewhere without behavioral disturbance: Secondary | ICD-10-CM | POA: Diagnosis not present

## 2017-10-15 DIAGNOSIS — Z9841 Cataract extraction status, right eye: Secondary | ICD-10-CM

## 2017-10-15 DIAGNOSIS — E119 Type 2 diabetes mellitus without complications: Secondary | ICD-10-CM

## 2017-10-15 DIAGNOSIS — Z85038 Personal history of other malignant neoplasm of large intestine: Secondary | ICD-10-CM

## 2017-10-15 DIAGNOSIS — E1151 Type 2 diabetes mellitus with diabetic peripheral angiopathy without gangrene: Secondary | ICD-10-CM | POA: Diagnosis not present

## 2017-10-15 DIAGNOSIS — E785 Hyperlipidemia, unspecified: Secondary | ICD-10-CM | POA: Diagnosis present

## 2017-10-15 DIAGNOSIS — F0390 Unspecified dementia without behavioral disturbance: Secondary | ICD-10-CM | POA: Diagnosis present

## 2017-10-15 DIAGNOSIS — N179 Acute kidney failure, unspecified: Secondary | ICD-10-CM | POA: Diagnosis present

## 2017-10-15 DIAGNOSIS — N390 Urinary tract infection, site not specified: Secondary | ICD-10-CM | POA: Diagnosis present

## 2017-10-15 DIAGNOSIS — R310 Gross hematuria: Secondary | ICD-10-CM | POA: Diagnosis present

## 2017-10-15 DIAGNOSIS — J449 Chronic obstructive pulmonary disease, unspecified: Secondary | ICD-10-CM | POA: Diagnosis not present

## 2017-10-15 DIAGNOSIS — Z7902 Long term (current) use of antithrombotics/antiplatelets: Secondary | ICD-10-CM

## 2017-10-15 DIAGNOSIS — T83511A Infection and inflammatory reaction due to indwelling urethral catheter, initial encounter: Secondary | ICD-10-CM | POA: Diagnosis not present

## 2017-10-15 DIAGNOSIS — Z66 Do not resuscitate: Secondary | ICD-10-CM | POA: Diagnosis not present

## 2017-10-15 DIAGNOSIS — R319 Hematuria, unspecified: Secondary | ICD-10-CM | POA: Diagnosis not present

## 2017-10-15 DIAGNOSIS — T83098A Other mechanical complication of other indwelling urethral catheter, initial encounter: Secondary | ICD-10-CM | POA: Diagnosis not present

## 2017-10-15 DIAGNOSIS — G309 Alzheimer's disease, unspecified: Secondary | ICD-10-CM | POA: Diagnosis not present

## 2017-10-15 DIAGNOSIS — I739 Peripheral vascular disease, unspecified: Secondary | ICD-10-CM | POA: Diagnosis not present

## 2017-10-15 DIAGNOSIS — Z961 Presence of intraocular lens: Secondary | ICD-10-CM | POA: Diagnosis present

## 2017-10-15 DIAGNOSIS — E875 Hyperkalemia: Secondary | ICD-10-CM | POA: Diagnosis present

## 2017-10-15 DIAGNOSIS — I5042 Chronic combined systolic (congestive) and diastolic (congestive) heart failure: Secondary | ICD-10-CM | POA: Diagnosis present

## 2017-10-15 DIAGNOSIS — Z823 Family history of stroke: Secondary | ICD-10-CM

## 2017-10-15 DIAGNOSIS — Z7984 Long term (current) use of oral hypoglycemic drugs: Secondary | ICD-10-CM

## 2017-10-15 DIAGNOSIS — I110 Hypertensive heart disease with heart failure: Secondary | ICD-10-CM | POA: Diagnosis present

## 2017-10-15 DIAGNOSIS — M2550 Pain in unspecified joint: Secondary | ICD-10-CM | POA: Diagnosis not present

## 2017-10-15 DIAGNOSIS — I10 Essential (primary) hypertension: Secondary | ICD-10-CM | POA: Diagnosis not present

## 2017-10-15 DIAGNOSIS — Z7401 Bed confinement status: Secondary | ICD-10-CM | POA: Diagnosis not present

## 2017-10-15 DIAGNOSIS — Z9842 Cataract extraction status, left eye: Secondary | ICD-10-CM

## 2017-10-15 DIAGNOSIS — R338 Other retention of urine: Secondary | ICD-10-CM | POA: Diagnosis not present

## 2017-10-15 DIAGNOSIS — E118 Type 2 diabetes mellitus with unspecified complications: Secondary | ICD-10-CM | POA: Diagnosis not present

## 2017-10-15 DIAGNOSIS — B952 Enterococcus as the cause of diseases classified elsewhere: Secondary | ICD-10-CM | POA: Diagnosis present

## 2017-10-15 DIAGNOSIS — J9611 Chronic respiratory failure with hypoxia: Secondary | ICD-10-CM | POA: Diagnosis not present

## 2017-10-15 DIAGNOSIS — R1314 Dysphagia, pharyngoesophageal phase: Secondary | ICD-10-CM | POA: Diagnosis present

## 2017-10-15 LAB — CBC WITH DIFFERENTIAL/PLATELET
BASOS ABS: 0 10*3/uL (ref 0.0–0.1)
Basophils Relative: 0 %
EOS ABS: 0 10*3/uL (ref 0.0–0.7)
Eosinophils Relative: 0 %
HCT: 42.2 % (ref 39.0–52.0)
Hemoglobin: 13.7 g/dL (ref 13.0–17.0)
LYMPHS ABS: 0.8 10*3/uL (ref 0.7–4.0)
Lymphocytes Relative: 3 %
MCH: 28.8 pg (ref 26.0–34.0)
MCHC: 32.5 g/dL (ref 30.0–36.0)
MCV: 88.7 fL (ref 78.0–100.0)
MONO ABS: 2.1 10*3/uL — AB (ref 0.1–1.0)
Monocytes Relative: 8 %
Neutro Abs: 23.8 10*3/uL — ABNORMAL HIGH (ref 1.7–7.7)
Neutrophils Relative %: 89 %
PLATELETS: 492 10*3/uL — AB (ref 150–400)
RBC: 4.76 MIL/uL (ref 4.22–5.81)
RDW: 15.6 % — AB (ref 11.5–15.5)
WBC: 26.7 10*3/uL — ABNORMAL HIGH (ref 4.0–10.5)

## 2017-10-15 LAB — URINALYSIS, ROUTINE W REFLEX MICROSCOPIC

## 2017-10-15 LAB — BASIC METABOLIC PANEL
Anion gap: 14 (ref 5–15)
BUN: 33 mg/dL — ABNORMAL HIGH (ref 8–23)
CALCIUM: 10 mg/dL (ref 8.9–10.3)
CO2: 23 mmol/L (ref 22–32)
Chloride: 104 mmol/L (ref 98–111)
Creatinine, Ser: 2.09 mg/dL — ABNORMAL HIGH (ref 0.61–1.24)
GFR calc Af Amer: 33 mL/min — ABNORMAL LOW (ref >60)
GFR calc non Af Amer: 28 mL/min — ABNORMAL LOW (ref >60)
Glucose, Bld: 209 mg/dL — ABNORMAL HIGH (ref 70–99)
Potassium: 5.8 mmol/L — ABNORMAL HIGH (ref 3.5–5.1)
Sodium: 141 mmol/L (ref 135–145)

## 2017-10-15 LAB — URINALYSIS, MICROSCOPIC (REFLEX)

## 2017-10-15 MED ORDER — ALBUTEROL SULFATE (2.5 MG/3ML) 0.083% IN NEBU
2.5000 mg | INHALATION_SOLUTION | Freq: Four times a day (QID) | RESPIRATORY_TRACT | Status: DC | PRN
Start: 2017-10-15 — End: 2017-10-18

## 2017-10-15 MED ORDER — SODIUM CHLORIDE 0.9 % IV SOLN: 1.0000 g | INTRAVENOUS | Status: DC

## 2017-10-15 MED ORDER — GLUCERNA SHAKE PO LIQD
237.0000 mL | Freq: Three times a day (TID) | ORAL | Status: DC
  Administered 2017-10-16 (×2): 237 mL via ORAL
  Filled 2017-10-15 (×2): qty 237

## 2017-10-15 MED ORDER — SODIUM CHLORIDE 0.9 % IV SOLN
Freq: Once | INTRAVENOUS | Status: AC
  Administered 2017-10-15: 21:00:00 via INTRAVENOUS

## 2017-10-15 MED ORDER — ACETAMINOPHEN 325 MG PO TABS: 650.0000 mg | ORAL_TABLET | Freq: Four times a day (QID) | ORAL | Status: DC | PRN

## 2017-10-15 MED ORDER — ONDANSETRON HCL 4 MG/2ML IJ SOLN: 4.0000 mg | Freq: Four times a day (QID) | INTRAMUSCULAR | Status: DC | PRN

## 2017-10-15 MED ORDER — SODIUM CHLORIDE 0.9 % IV SOLN: 2.0000 g | Freq: Once | INTRAVENOUS | Status: DC

## 2017-10-15 MED ORDER — ONDANSETRON HCL 4 MG PO TABS: 4.0000 mg | ORAL_TABLET | Freq: Four times a day (QID) | ORAL | Status: DC | PRN

## 2017-10-15 MED ORDER — SODIUM CHLORIDE 0.9 % IV BOLUS
1000.0000 mL | Freq: Once | INTRAVENOUS | Status: AC
  Administered 2017-10-15: 1000 mL via INTRAVENOUS

## 2017-10-15 MED ORDER — FAMOTIDINE 20 MG PO TABS
20.0000 mg | ORAL_TABLET | Freq: Every day | ORAL | Status: DC
  Administered 2017-10-16: 20 mg via ORAL
  Filled 2017-10-15: qty 1

## 2017-10-15 MED ORDER — HYDRALAZINE HCL 10 MG PO TABS
10.0000 mg | ORAL_TABLET | Freq: Three times a day (TID) | ORAL | Status: DC
  Administered 2017-10-15 – 2017-10-16 (×3): 10 mg via ORAL
  Filled 2017-10-15 (×3): qty 1

## 2017-10-15 MED ORDER — METOPROLOL TARTRATE 25 MG PO TABS
25.0000 mg | ORAL_TABLET | Freq: Two times a day (BID) | ORAL | Status: DC
  Administered 2017-10-15 – 2017-10-16 (×3): 25 mg via ORAL
  Filled 2017-10-15 (×3): qty 1

## 2017-10-15 MED ORDER — SODIUM CHLORIDE 0.9 % IV SOLN
1.0000 g | Freq: Once | INTRAVENOUS | Status: AC
  Administered 2017-10-15: 1 g via INTRAVENOUS
  Filled 2017-10-15: qty 1

## 2017-10-15 MED ORDER — ACETAMINOPHEN 650 MG RE SUPP: 650.0000 mg | Freq: Four times a day (QID) | RECTAL | Status: DC | PRN

## 2017-10-15 MED ORDER — LINAGLIPTIN 5 MG PO TABS
5.0000 mg | ORAL_TABLET | Freq: Every day | ORAL | Status: DC
  Administered 2017-10-16: 5 mg via ORAL
  Filled 2017-10-15: qty 1

## 2017-10-15 MED ORDER — POLYETHYLENE GLYCOL 3350 17 G PO PACK: 17.0000 g | PACK | Freq: Every day | ORAL | Status: DC | PRN

## 2017-10-15 MED ORDER — DM-GUAIFENESIN ER 30-600 MG PO TB12
1.0000 | ORAL_TABLET | Freq: Two times a day (BID) | ORAL | Status: DC
  Administered 2017-10-15 – 2017-10-18 (×6): 1 via ORAL
  Filled 2017-10-15 (×6): qty 1

## 2017-10-15 MED ORDER — TRAMADOL HCL 50 MG PO TABS: 50.0000 mg | ORAL_TABLET | Freq: Two times a day (BID) | ORAL | Status: DC | PRN

## 2017-10-15 MED ORDER — FLUTICASONE FUROATE-VILANTEROL 100-25 MCG/INH IN AEPB
1.0000 | INHALATION_SPRAY | Freq: Every day | RESPIRATORY_TRACT | Status: DC
  Administered 2017-10-16 – 2017-10-18 (×3): 1 via RESPIRATORY_TRACT
  Filled 2017-10-15: qty 28

## 2017-10-15 MED ORDER — PRAVASTATIN SODIUM 40 MG PO TABS
80.0000 mg | ORAL_TABLET | Freq: Every day | ORAL | Status: DC
  Administered 2017-10-17: 80 mg via ORAL
  Filled 2017-10-15 (×2): qty 2

## 2017-10-15 MED ORDER — INSULIN ASPART 100 UNIT/ML ~~LOC~~ SOLN
0.0000 [IU] | Freq: Three times a day (TID) | SUBCUTANEOUS | Status: DC
  Administered 2017-10-16: 1 [IU] via SUBCUTANEOUS

## 2017-10-15 MED ORDER — SODIUM CHLORIDE 0.9 % IV SOLN
INTRAVENOUS | Status: AC
  Administered 2017-10-15: via INTRAVENOUS

## 2017-10-15 MED ORDER — LIDOCAINE HCL URETHRAL/MUCOSAL 2 % EX GEL
1.0000 "application " | Freq: Once | CUTANEOUS | Status: AC
  Administered 2017-10-15: 1 via URETHRAL
  Filled 2017-10-15: qty 5

## 2017-10-15 NOTE — Patient Outreach (Signed)
[facility]) Care Management  10/15/2017  [name] 1934-10-27 [id number]  [facility] CSW arrived at [name]. [facility] CSW was informed that SNF social worker feels the goal is to return home. However, she does not have an expected discharge date. SNF social worker reports meeting with wife but that meeting was not as beneficial as it needed to be as she was providing care for him during meeting time so they left. [facility] CSW informed SNF social worker that family was interested in [name] placement and was encouraged to apply for Medicaid as soon as possible. SNF social worker appreciative of update.   [facility] CSW met with patient successfully. Patient reports that he is feeling better. He shares that his family continues to visit and support him during his SNF stay at [facility]. Patient admits to experiencing some SOB still but denies any chest pain. Patient's PT came in and informed [name] CSW that patient has made progress in therapy and that patient is able to walk with walker but that he is mainly use wheelchair for now. [facility] CSW was informed that they are working on safety cues and staying on task with patient as there continues to be safety concerns.   [facility] CSW completed call to patient's spouse [name] and was able to reach her successfully. HIPPA verifications received. Spouse reports that she went to DSS last Friday and successfully applied for LTC Medicaid on patient's behalf. Spouse is VERY ADAMANT about patient not being able to return home as she is not able to take care of him and is also disabled. Spouse is agreeable to go to SNF tomorrow and discuss LTC placement with SNF social worker again now that she has applied for Medicaid. [facility] CSW sent a secure email to SNF social worker providing Medicaid update. [facility] CSW will follow up within one week.   [name], BSW, MSW, [email or web address] Phone: [phone] Fax:  [phone]

## 2017-10-15 NOTE — ED Notes (Signed)
Foley bag was changed because it was leaking

## 2017-10-15 NOTE — ED Notes (Signed)
Bed: KM62 Expected date:  Expected time:  Means of arrival:  Comments: EMS-urinary retention

## 2017-10-15 NOTE — ED Notes (Signed)
Patient transported to X-ray 

## 2017-10-15 NOTE — Patient Outreach (Signed)
[facility]) Care Management  10/15/2017  [name] 27-Apr-1934 [id number]  [facility] CSW received incoming return email from SNF social worker Marita [place] who states that [facility] is currently not accepting long term placement as they have too many with pending Medicaid and wanting to remain in the facility. [facility] CSW was informed that spouse will need to look at other facilities for LTC placement.  [facility] CSW received an additional email stating that they are sending patient to the hospital right now due to no urine output. [employer] Education officer, museum states that if [name] is admitted, it would be in his best interest to be discharged to a facility that can keep him for long term placement under Medicaid. [facility] CSW will route encounter to [place] so that they are aware that patient is NOT able to be placed long term at [facility] anymore and if admitted will need to go to a different SNF that offers long term placement.   [name], BSW, MSW, [email or web address] Phone: [phone] Fax: [phone]

## 2017-10-15 NOTE — ED Notes (Signed)
2x unsuccessful IV attempt 

## 2017-10-15 NOTE — H&P (Addendum)
History and Physical    [name] ZSW:[id number] DOB: 11-Apr-1934 DOA: 10/15/2017  PCP: Glendale Chard, MD   Patient coming from: [facility] rehab.  Chief Complaint:   HPI: [name] is a 83 y.o. male with medical history significant for dementia, systolic CHF- 05/2200 Echo-EF 75 to 25%, G2DD, COPD with chronic respiratory failure, HTN, PVD, DM, who was brought to the ED via EMS from [place] and rehab with reports of reduced urine output yesterday with blood, and today no urine output via chronic indwelling Foley.  Spouse is at bedside.  History is obtained mostly from the spouse and chart review, and nursing home notes, as patient- has significant dementia and cannot give me history.  Patient also complained of lower abdominal pain, and refused po intake today.  Patient spouse sees him daily she endorses a wet cough she noticed today, otherwise denies difficulty breathing reports of fever or chills at nursing home. Recent hospital admission 6/27- 10/02/17-initially for gross hematuria, with perforated urinary bladder and development of an ileus during admission.  Patient was discharged to SNF with Foley catheter and antibiotics cefuroxime to follow-up with urology outpatient.   ED Course: Elevated blood pressure 166/89 otherwise unremarkable vitals.  Leukocytosis- 26.  Creatinine elevated 2.03, elevated potassium 5.8.  Two-view chest and abdominal x-ray negative for acute abnormality. Urology- Dr. Fonda Kinder, consulted in ED-recommended changing Foley catheter.  514ml of cloudy bloody urine drained . 1 L bolus normal saline given in ED IV cefepime started.  Hospitalist called to admit for AKI and probable UTI.  Review of Systems: Unable to obtain due to patient's significant dementia.  Past Medical History:  Diagnosis Date  . Alzheimer's dementia 2013  . Anemia    HX OF ANEMIA  . CHF (congestive heart failure) ([place])   . Colon cancer ([place])   . COPD (chronic obstructive pulmonary  disease) ([place])   . Depression   . Dyspnea   . Hyperlipidemia   . Hypertension   . Pneumonia 2014  . PVD (peripheral vascular disease) ([place])   . Type II diabetes mellitus ([place])     Past Surgical History:  Procedure Laterality Date  . ABDOMINAL ANGIOGRAM N/A 04/20/2013   Procedure: ABDOMINAL ANGIOGRAM;  Surgeon: Angelia Mould, MD;  Location: [facility] CATH LAB;  Service: Cardiovascular;  Laterality: N/A;  . ABDOMINAL AORTOGRAM W/LOWER EXTREMITY N/A 11/20/2016   Procedure: ABDOMINAL AORTOGRAM W/LOWER EXTREMITY;  Surgeon: Serafina Mitchell, MD;  Location: [place] CV LAB;  Service: Cardiovascular;  Laterality: N/A;  . CATARACT EXTRACTION Right 02/2013  . CATARACT EXTRACTION W/ INTRAOCULAR LENS IMPLANT Left 01/2013  . COLON RESECTION  1997   /enc. notes 05/17/2004 (04/28/2013)  . COLON SURGERY  1996   cancer  . ESOPHAGOGASTRODUODENOSCOPY N/A 09/05/2013   Procedure: ESOPHAGOGASTRODUODENOSCOPY (EGD);  Surgeon: Lear Ng, MD;  Location: [facility] ENDOSCOPY;  Service: Endoscopy;  Laterality: N/A;  . FEMORAL ARTERY - FEMORAL ARTERY BYPASS GRAFT     right to left/enc. notes 05/17/2004 (04/28/2013)  . FEMORAL BYPASS Right    /enc. notes 05/17/2004 (04/28/2013)  . FEMORAL-POPLITEAL BYPASS GRAFT Right 04/29/2013   Procedure:  FEMORAL-POPLITEAL ARTERY Bypass Graft with intraoperative ultrasound and arteriogarm;  Surgeon: Mal Misty, MD;  Location: [place];  Service: Vascular;  Laterality: Right;  . FIBEROPTIC BRONCHOSCOPY     Yvette Rack. notes 03/16/2005 (04/28/2013)  . INTRAOPERATIVE ARTERIOGRAM Right 04/29/2013   Procedure: INTRA OPERATIVE ARTERIOGRAM;  Surgeon: Mal Misty, MD;  Location: [place];  Service: Vascular;  Laterality: Right;  . LOWER EXTREMITY ANGIOGRAM  04/20/2013   Procedure: LOWER EXTREMITY ANGIOGRAM;  Surgeon: Angelia Mould, MD;  Location: [facility] CATH LAB;  Service: Cardiovascular;;  . LOWER EXTREMITY ANGIOGRAPHY Bilateral 06/25/2017   Procedure: LOWER EXTREMITY ANGIOGRAPHY;  Surgeon:  Serafina Mitchell, MD;  Location: [place] CV LAB;  Service: Cardiovascular;  Laterality: Bilateral;  . [place] SURGERY  2005  . PERCUTANEOUS STENT INTERVENTION  04/20/2013   Procedure: PERCUTANEOUS STENT INTERVENTION;  Surgeon: Angelia Mould, MD;  Location: [facility] C[facility] LAB;  Service: Cardiovascular;;  right common iliac artery  . PERIPHERAL VASCULAR CATHETERIZATION N/A 01/31/2016   Procedure: Abdominal Aortogram w/Lower Extremity;  Surgeon: Serafina Mitchell, MD;  Location: [place] CV LAB;  Service: Cardiovascular;  Laterality: N/A;  . PERIPHERAL VASCULAR CATHETERIZATION Right 01/31/2016   Procedure: Peripheral Vascular Atherectomy;  Surgeon: Serafina Mitchell, MD;  Location: [place] CV LAB;  Service: Cardiovascular;  Laterality: Right;  Iliac common and external  . PERIPHERAL VASCULAR CATHETERIZATION Right 01/31/2016   Procedure: Peripheral Vascular Intervention;  Surgeon: Serafina Mitchell, MD;  Location: [place] CV LAB;  Service: Cardiovascular;  Laterality: Right;  external iliac and common iliac  . PERIPHERAL VASCULAR CATHETERIZATION N/A 04/24/2016   Procedure: Abdominal Aortogram w/Lower Extremity;  Surgeon: Serafina Mitchell, MD;  Location: [place] CV LAB;  Service: Cardiovascular;  Laterality: N/A;  . PERIPHERAL VASCULAR CATHETERIZATION Right 04/24/2016   Procedure: Peripheral Vascular Intervention;  Surgeon: Serafina Mitchell, MD;  Location: [place] CV LAB;  Service: Cardiovascular;  Laterality: Right;  external illiac Rt  . SPINE SURGERY    . VASCULAR SURGERY       reports that he quit smoking about 24 years ago. His smoking use included cigarettes. He has a 12.00 pack-year smoking history. He has never used smokeless tobacco. He reports that he does not drink alcohol or use drugs.  No Known Allergies  Family History  Problem Relation Age of Onset  . Stroke Mother 71  . Hypertension Mother   . Cancer Sister   . Heart disease Sister   . Heart disease Brother         Heart Disease before age 42  . Heart attack Brother   . Cancer Sister   . Cancer Brother     Prior to Admission medications   Medication Sig Start Date End Date Taking? Authorizing Provider  acetaminophen (TYLENOL) 500 MG tablet Take 500 mg by mouth every 6 (six) hours as needed for mild pain.    Yes [provider]  albuterol (PROVENTIL) (2.5 MG/3ML) 0.083% nebulizer solution Take 2.5 mg by nebulization every 6 (six) hours as needed for wheezing or shortness of breath.   Yes [provider]  bisacodyl (DULCOLAX) 10 MG suppository Place 10 mg rectally daily as needed for moderate constipation (If Milk of Magnesia doesn't work).   Yes [provider]  BREO ELLIPTA 100-25 MCG/INH AEPB TAKE 1 PUFF BY MOUTH EVERY DAY 09/09/17  Yes Tanda Rockers, MD  cefUROXime (CEFTIN) 500 MG tablet Take 1 tablet (500 mg total) by mouth 2 (two) times daily with a meal for 14 days. 10/01/17 10/15/17 Yes Rai, Ripudeep K, MD  clopidogrel (PLAVIX) 75 MG tablet TAKE 1 TABLET BY MOUTH DAILY. Patient taking differently: TAKE 75 MG BY MOUTH DAILY. 05/28/17  Yes Waynetta Sandy, MD  co-enzyme Q-10 30 MG capsule Take 30 mg by mouth at bedtime.   Yes [provider]  dextromethorphan-guaiFENesin [facility] DM)  30-600 MG 12hr tablet Take 1 tablet by mouth 2 (two) times daily for 14 days. 10/01/17 10/15/17 Yes Rai, Ripudeep K, MD  famotidine (PEPCID) 20 MG tablet One at bedtime Patient taking differently: Take 20 mg by mouth daily. One at bedtime 04/26/17  Yes Tanda Rockers, MD  furosemide (LASIX) 20 MG tablet Take 1 tablet (20 mg total) by mouth every Monday, Wednesday, and Friday. 10/02/17  Yes Florencia Reasons, MD  GLUCERNA [facility]) LIQD Take 237 mLs by mouth 3 (three) times daily with meals. Chocolate   Yes [provider]  hydrALAZINE (APRESOLINE) 10 MG tablet Take 1 tablet (10 mg total) by mouth 3 (three) times daily. 10/01/17  Yes Rai, Ripudeep K, MD  magnesium hydroxide (MILK OF  MAGNESIA) 400 MG/5ML suspension Take 30 mLs by mouth daily as needed for mild constipation.   Yes [provider]  metoprolol tartrate (LOPRESSOR) 25 MG tablet Take 1 tablet (25 mg total) by mouth 2 (two) times daily. 10/01/17  Yes Rai, Ripudeep K, MD  multivitamin-iron-minerals-folic acid (CENTRUM) chewable tablet Chew 1 tablet by mouth at bedtime.   Yes [provider]  omega-3 acid ethyl esters (LOVAZA) 1 g capsule Take by mouth at bedtime.   Yes [provider]  ondansetron (ZOFRAN) 4 MG tablet Take 1 tablet (4 mg total) by mouth every 6 (six) hours as needed for nausea. 10/01/17  Yes Rai, Ripudeep K, MD  pravastatin (PRAVACHOL) 80 MG tablet Take 80 mg by mouth daily. 07/17/17  Yes [provider]  sitaGLIPtin (JANUVIA) 100 MG tablet Take 100 mg by mouth daily.   Yes [provider]  Sodium Phosphates (RA SALINE ENEMA) 19-7 GM/118ML ENEM Place 1 application rectally daily as needed (If Milk of Magnesia and Bisacodyl don't work).   Yes [provider]    Physical Exam: Physical exam limited by patient's significant dementia Vitals:   10/15/17 1650 10/15/17 1700 10/15/17 1817 10/15/17 2021  BP: (!) 166/89 (!) 158/78 (!) 145/74 140/68  Pulse: 76 74 81 81  Resp: 18  18 20   Temp: 98.3 F (36.8 C)     TempSrc: Oral     SpO2: 100% 100% 99% 100%  Weight:      Height:        Constitutional: Initially sleeping easily arousable to voice awake alert, wet sounding cough, calm, comfortable, not answering questions. Vitals:   10/15/17 1650 10/15/17 1700 10/15/17 1817 10/15/17 2021  BP: (!) 166/89 (!) 158/78 (!) 145/74 140/68  Pulse: 76 74 81 81  Resp: 18  18 20   Temp: 98.3 F (36.8 C)     TempSrc: Oral     SpO2: 100% 100% 99% 100%  Weight:      Height:       Eyes: PERRL, lids and conjunctivae normal ENMT: Mucous membranes are dry.  Neck: no masses, no thyromegaly Respiratory: clear to auscultation bilaterally, no wheezing, no crackles.  Normal respiratory effort. No accessory muscle use.  Cardiovascular: Regular rate and rhythm, no murmurs / rubs / gallops. No extremity edema. Abdomen: firm especially lower abdomen, s/p changed foley, with guarding, mild lower abdominal tenderness. Musculoskeletal: no clubbing / cyanosis. No joint deformity upper and lower extremities. Good ROM, no contractures. Normal muscle tone. Warm lower extremities, but Right lower extremity slightly cooler than left. Skin: no rashes, lesions, ulcers. No induration Neurologic: Unable to fully examine due to significant dementia, moving all extremities spontaneously. psychiatric: Unable to fully assess, but awake and alert.   Labs on  Admission: I have personally reviewed following labs and imaging studies  CBC: Recent Labs  Lab 10/15/17 1842  WBC 26.7*  NEUTROABS 23.8*  HGB 13.7  HCT 42.2  MCV 88.7  PLT 308*   Basic Metabolic Panel: Recent Labs  Lab 10/15/17 1842  NA 141  K 5.8*  CL 104  CO2 23  GLUCOSE 209*  BUN 33*  CREATININE 2.09*  CALCIUM 10.0   Urine analysis:    Component Value Date/Time   COLORURINE RED (A) 10/15/2017 1814   APPEARANCEUR TURBID (A) 10/15/2017 1814   LABSPEC  10/15/2017 1814    TEST NOT REPORTED DUE TO COLOR INTERFERENCE OF URINE PIGMENT   PHURINE  10/15/2017 1814    TEST NOT REPORTED DUE TO COLOR INTERFERENCE OF URINE PIGMENT   GLUCOSEU (A) 10/15/2017 1814    TEST NOT REPORTED DUE TO COLOR INTERFERENCE OF URINE PIGMENT   HGBUR (A) 10/15/2017 1814    TEST NOT REPORTED DUE TO COLOR INTERFERENCE OF URINE PIGMENT   BILIRUBINUR (A) 10/15/2017 1814    TEST NOT REPORTED DUE TO COLOR INTERFERENCE OF URINE PIGMENT   BILIRUBINUR negative 05/23/2017 0855   KETONESUR (A) 10/15/2017 1814    TEST NOT REPORTED DUE TO COLOR INTERFERENCE OF URINE PIGMENT   PROTEINUR (A) 10/15/2017 1814    TEST NOT REPORTED DUE TO COLOR INTERFERENCE OF URINE PIGMENT   UROBILINOGEN 0.2 05/23/2017 0855   UROBILINOGEN 1.0 12/10/2013  1456   NITRITE (A) 10/15/2017 1814    TEST NOT REPORTED DUE TO COLOR INTERFERENCE OF URINE PIGMENT   LEUKOCYTESUR (A) 10/15/2017 1814    TEST NOT REPORTED DUE TO COLOR INTERFERENCE OF URINE PIGMENT    Radiological Exams on Admission: Dg Chest 2 View  Result Date: 10/15/2017 CLINICAL DATA:  Cough EXAM: CHEST - 2 VIEW COMPARISON:  09/21/2017 FINDINGS: Fine reticular opacities at the right base correlates with pulmonary microcalcifications on abdominal CT 09/24/2017. No edema or consolidation. No effusion or pneumothorax. Normal heart size and mediastinal contours. Atherosclerotic calcification. IMPRESSION: No acute finding when compared to priors. Electronically Signed   By: Monte Fantasia M.D.   On: 10/15/2017 19:12   Dg Abd 2 Views  Result Date: 10/15/2017 CLINICAL DATA:  Ileus.  Decreased urine output EXAM: ABDOMEN - 2 VIEW COMPARISON:  09/28/2017 FINDINGS: Normal bowel gas pattern. No excessive stool retention. Interspinous spacer at L4-5. Clips and bowel sutures in the pelvis with right iliac stenting. History of Foley catheter with none visualized on this study. The visualized lung bases are clear. IMPRESSION: Normal bowel gas pattern. Electronically Signed   By: Monte Fantasia M.D.   On: 10/15/2017 19:09    EKG: Artifacts, QTC 482  Assessment/Plan Principal Problem:   AKI (acute kidney injury) ([place]) Active Problems:   PVD   COPD COPD II    Dementia   Chronic systolic CHF (congestive heart failure) (HCC)   Diabetes mellitus ([place])   Gross hematuria  Acute kidney injury- Cr- 2.  Baseline ~0.7.  BUN 33.  Post obstruction Vs prerenal. 1L bolus given in the ED. -Continue hydration gently at 50 cc/h X 12 hr - BMP a.m  Acute urinary retention with gross hematuria- likely 2/2 occluded Foley catheter.  With lower abdominal pain, gross hematuria in urine bag. -CT abdomen and pelvis considering persistent abdominal pain history of bladder rupture -Urology reconsultation in a.m. -Hold  Plavix for now  Leukocytosis- 26, Ua-gross hematuria, few bacteria.  On cefuroxime.  Two-view chest x-ray negative for acute abnormality. No SOB, but  has wet cough.  -F/u urine cultures -Continue IV cefepime (pharmacy to dose) started in ED - CBC a.m  Mild hyperkalemia- K- 5.8.  Likely related to AKI. EKG with artifacts.  - Hydrate, K likely down trend with hydration, continue to increase consider Kayexalate, insulin and D50. -Repeat BMP a.m. -Repeat EKG  COPD-chronic respiratory failure,coughing, no wheeze or dyspnea. WBC- 26. -Continue home bronchodilators  Chronic systolic CHF-appears stable, with AKI, and dry mucous membranes.  Previously on digoxin not on med list at this time -Gently hydrate, hold lasix -Continue metoprolol and pravastatin  PVD-multiple interventions in the past including bypass and stents.  Further interventions limited by patient's severe dementia and medical comorbidities -Hold Plavix  DM- glucose- 209.  Home medications Januvia.  No recent A1c on file  DVT prophylaxis: Scds for now with gross hematuria Code Status: DNR, family spouse at bedside Family Communication: Spouse present at bedside Disposition Plan: Per rounding team Consults called: none, consider urology consult in a.m Admission status: inpt, tele for hyperK.   Bethena Roys MD [facility] Pager [phone] From 6PM-2AM.  Otherwise please contact night-coverage [email or web address] Password [email or web address]  10/15/2017, 9:00 PM

## 2017-10-15 NOTE — Progress Notes (Signed)
A consult was received from an ED physician for Cefepime per pharmacy dosing.  The patient's profile has been reviewed for ht/wt/allergies/indication/available labs.    A one time order has been placed for Cefepime 1g IV.  Further antibiotics/pharmacy consults should be ordered by admitting physician if indicated.                       Thank you, Luiz Ochoa 10/15/2017  8:00 PM

## 2017-10-15 NOTE — ED Notes (Signed)
ED TO INPATIENT HANDOFF REPORT  Name/Age/Gender [name] 83 y.o. male  Code Status Code Status History    Date Active Date Inactive Code Status Order ID Comments User Context   09/24/2017 4696 10/02/2017 2101 DNR [id number]  Bonnielee Haff, MD Inpatient   09/19/2017 2009 09/24/2017 1544 Full Code [id number]  Cristy Folks, MD Inpatient   06/25/2017 1809 06/28/2017 1938 Full Code [id number]  Ulyses Amor, PA-C Inpatient   06/25/2017 1130 06/25/2017 1809 Full Code [id number]  Serafina Mitchell, MD Inpatient   05/02/2017 1953 05/05/2017 1552 Full Code [id number]  Cristal Ford, DO ED   11/20/2016 1722 11/21/2016 0034 Full Code [id number]  Serafina Mitchell, MD Inpatient   04/24/2016 1736 04/25/2016 0010 Full Code [id number]  Serafina Mitchell, MD Inpatient   01/31/2016 1912 02/01/2016 1510 Full Code [id number]  Serafina Mitchell, MD Inpatient   01/31/2016 1912 01/31/2016 1912 Full Code [id number]  Serafina Mitchell, MD Inpatient   12/09/2013 1947 12/14/2013 2126 Full Code [id number]  Etta Quill, DO Inpatient   09/01/2013 2201 09/07/2013 1735 Full Code [id number]  Etta Quill, DO ED   04/29/2013 1209 05/02/2013 1842 Full Code [id number]  Gabriel Earing, PA-C Inpatient   04/28/2013 1127 04/29/2013 1209 Full Code [id number]  Ulyses Amor, PA-C Inpatient   03/23/2013 0554 03/24/2013 1710 Full Code [id number]  Etta Quill, DO ED   07/04/2012 1717 07/05/2012 1842 Full Code [id number]  Thurnell Lose, MD Inpatient    Questions for Most Recent Historical Code Status (Order [id number])    Question Answer Comment   In the event of cardiac or respiratory ARREST Do not call a "code blue"    In the event of cardiac or respiratory ARREST Do not perform Intubation, CPR, defibrillation or ACLS    In the event of cardiac or respiratory ARREST Use medication by any route, position, wound care, and other measures to relive pain and suffering. May use oxygen, suction and manual treatment of airway obstruction as needed for  comfort.         Advance Directive Documentation     Most Recent Value  Type of Advance Directive  Healthcare Power of [place], Out of facility DNR (pink MOST or yellow form)  Pre-existing out of facility DNR order (yellow form or pink MOST form)  -  "MOST" Form in Place?  -      Home/SNF/Other Skilled nursing facility  Chief Complaint Urinary Retention  Level of Care/Admitting Diagnosis ED Disposition    ED Disposition Condition [place]: [place] [100102]  Level of Care: Telemetry [5]  Admit to tele based on following criteria: Other see comments  Comments: Hyperkalemia  Diagnosis: Acute kidney injury [facility]) [527782]  Admitting Physician: Bethena Roys [phone]  Attending Physician: Bethena Roys [phone]  Estimated length of stay: past midnight tomorrow  Certification:: I certify this patient will need inpatient services for at least 2 midnights  PT Class (Do Not Modify): Inpatient [101]  PT Acc Code (Do Not Modify): Private [1]       Medical History Past Medical History:  Diagnosis Date  . Alzheimer's dementia 2013  . Anemia    HX OF ANEMIA  . CHF (congestive heart failure) ([place])   . Colon cancer ([place])   . COPD (chronic obstructive pulmonary disease) ([place])   . Depression   . Dyspnea   . Hyperlipidemia   . Hypertension   . Pneumonia 2014  .  PVD (peripheral vascular disease) ([place])   . Type II diabetes mellitus ([place])     Allergies No Known Allergies  IV Location/Drains/Wounds Patient Lines/Drains/Airways Status   Active Line/Drains/Airways    Name:   Placement date:   Placement time:   Site:   Days:   Peripheral IV 10/15/17 Left Antecubital   10/15/17    1841    Antecubital   less than 1   Urethral Catheter [place] Record NT Latex;Straight-tip 16 Fr.   10/15/17    1806    Latex;Straight-tip   less than 1   Pressure Injury 09/22/17 Stage I -  Intact skin with non-blanchable redness of a localized  area usually over a bony prominence.   09/22/17    0049     23   Pressure Injury 09/28/17 Stage I -  Intact skin with non-blanchable redness of a localized area usually over a bony prominence.   09/28/17    0700     17          Labs/Imaging Results for orders placed or performed during the hospital encounter of 10/15/17 (from the past 48 hour(s))  Urinalysis, Routine w reflex microscopic     Status: Abnormal   Collection Time: 10/15/17  6:14 PM  Result Value Ref Range   Color, Urine RED (A) YELLOW    Comment: BIOCHEMICALS MAY BE AFFECTED BY COLOR   APPearance TURBID (A) CLEAR   Specific Gravity, Urine  1.005 - 1.030    TEST NOT REPORTED DUE TO COLOR INTERFERENCE OF URINE PIGMENT   pH  5.0 - 8.0    TEST NOT REPORTED DUE TO COLOR INTERFERENCE OF URINE PIGMENT   Glucose, UA (A) NEGATIVE mg/dL    TEST NOT REPORTED DUE TO COLOR INTERFERENCE OF URINE PIGMENT   Hgb urine dipstick (A) NEGATIVE    TEST NOT REPORTED DUE TO COLOR INTERFERENCE OF URINE PIGMENT   Bilirubin Urine (A) NEGATIVE    TEST NOT REPORTED DUE TO COLOR INTERFERENCE OF URINE PIGMENT   Ketones, ur (A) NEGATIVE mg/dL    TEST NOT REPORTED DUE TO COLOR INTERFERENCE OF URINE PIGMENT   Protein, ur (A) NEGATIVE mg/dL    TEST NOT REPORTED DUE TO COLOR INTERFERENCE OF URINE PIGMENT   Nitrite (A) NEGATIVE    TEST NOT REPORTED DUE TO COLOR INTERFERENCE OF URINE PIGMENT   Leukocytes, UA (A) NEGATIVE    TEST NOT REPORTED DUE TO COLOR INTERFERENCE OF URINE PIGMENT    Comment: Performed at [facility], [place] [address], [place], [place] [address]  Urinalysis, Microscopic (reflex)     Status: Abnormal   Collection Time: 10/15/17  6:14 PM  Result Value Ref Range   RBC / HPF >50 0 - 5 RBC/hpf   WBC, UA >50 0 - 5 WBC/hpf   Bacteria, UA FEW (A) NONE SEEN   Squamous Epithelial / LPF 0-5 0 - 5    Comment: Performed at [facility], [place] [address], [place], [place] [address]  CBC with Differential      Status: Abnormal   Collection Time: 10/15/17  6:42 PM  Result Value Ref Range   WBC 26.7 (H) 4.0 - 10.5 K/uL   RBC 4.76 4.22 - 5.81 MIL/uL   Hemoglobin 13.7 13.0 - 17.0 g/dL   HCT 42.2 39.0 - 52.0 %   MCV 88.7 78.0 - 100.0 fL   MCH 28.8 26.0 - 34.0 pg   MCHC 32.5 30.0 - 36.0 g/dL   RDW  15.6 (H) 11.5 - 15.5 %   Platelets 492 (H) 150 - 400 K/uL   Neutrophils Relative % 89 %   Lymphocytes Relative 3 %   Monocytes Relative 8 %   Eosinophils Relative 0 %   Basophils Relative 0 %   Neutro Abs 23.8 (H) 1.7 - 7.7 K/uL   Lymphs Abs 0.8 0.7 - 4.0 K/uL   Monocytes Absolute 2.1 (H) 0.1 - 1.0 K/uL   Eosinophils Absolute 0.0 0.0 - 0.7 K/uL   Basophils Absolute 0.0 0.0 - 0.1 K/uL   WBC Morphology WHITE COUNT CONFIRMED ON SMEAR     Comment: Performed at [facility], [place] [address] [place], [place] [address]  Basic metabolic panel     Status: Abnormal   Collection Time: 10/15/17  6:42 PM  Result Value Ref Range   Sodium 141 135 - 145 mmol/L   Potassium 5.8 (H) 3.5 - 5.1 mmol/L   Chloride 104 98 - 111 mmol/L   CO2 23 22 - 32 mmol/L   Glucose, Bld 209 (H) 70 - 99 mg/dL   BUN 33 (H) 8 - 23 mg/dL   Creatinine, Ser 2.09 (H) 0.61 - 1.24 mg/dL   Calcium 10.0 8.9 - 10.3 mg/dL   GFR calc non Af Amer 28 (L) >60 mL/min   GFR calc Af Amer 33 (L) >60 mL/min    Comment: (NOTE) The eGFR has been calculated using the CKD EPI equation. This calculation has not been validated in all clinical situations. eGFR's persistently <60 mL/min signify possible Chronic Kidney Disease.    Anion gap 14 5 - 15    Comment: Performed at [facility], [place] [address], [place], [place] [address]   Dg Chest 2 View  Result Date: 10/15/2017 CLINICAL DATA:  Cough EXAM: CHEST - 2 VIEW COMPARISON:  09/21/2017 FINDINGS: Fine reticular opacities at the right base correlates with pulmonary microcalcifications on abdominal CT 09/24/2017. No edema or consolidation. No effusion or pneumothorax.  Normal heart size and mediastinal contours. Atherosclerotic calcification. IMPRESSION: No acute finding when compared to priors. Electronically Signed   By: Monte Fantasia M.D.   On: 10/15/2017 19:12   Dg Abd 2 Views  Result Date: 10/15/2017 CLINICAL DATA:  Ileus.  Decreased urine output EXAM: ABDOMEN - 2 VIEW COMPARISON:  09/28/2017 FINDINGS: Normal bowel gas pattern. No excessive stool retention. Interspinous spacer at L4-5. Clips and bowel sutures in the pelvis with right iliac stenting. History of Foley catheter with none visualized on this study. The visualized lung bases are clear. IMPRESSION: Normal bowel gas pattern. Electronically Signed   By: Monte Fantasia M.D.   On: 10/15/2017 19:09    Pending Labs Unresulted Labs (From admission, onward)   Start     Ordered   10/15/17 1923  Urine culture  STAT,   STAT     10/15/17 1922   Signed and Held  Basic metabolic panel  Tomorrow morning,   R     Signed and Held   Signed and Held  CBC  Tomorrow morning,   R     Signed and Held      Vitals/Pain Today's Vitals   10/15/17 1650 10/15/17 1700 10/15/17 1817 10/15/17 2021  BP: (!) 166/89 (!) 158/78 (!) 145/74 140/68  Pulse: 76 74 81 81  Resp: '18  18 20  '$ Temp: 98.3 F (36.8 C)     TempSrc: Oral     SpO2: 100% 100% 99% 100%  Weight:  Height:        Isolation Precautions No active isolations  Medications Medications  ceFEPIme (MAXIPIME) 1 g in sodium chloride 0.9 % 100 mL IVPB (has no administration in time range)  lidocaine (XYLOCAINE) 2 % jelly 1 application (1 application Urethral Given 10/15/17 1756)  sodium chloride 0.9 % bolus 1,000 mL (0 mLs Intravenous Stopped 10/15/17 2032)  0.9 %  sodium chloride infusion ( Intravenous New Bag/Given 10/15/17 2033)  ceFEPIme (MAXIPIME) 1 g in sodium chloride 0.9 % 100 mL IVPB (1 g Intravenous New Bag/Given 10/15/17 2033)    Mobility non-ambulatory

## 2017-10-15 NOTE — Progress Notes (Signed)
Pharmacy Antibiotic Note  [name] is a 83 y.o. male presented from [place] on 10/15/2017 with no urine output via chronic indwelling foley.  Pharmacy has been consulted for Cefepime dosing for UTI.  Plan: Cefepime 1g IV q24h. Monitor renal function, cultures, clinical course.   Height: 6\' 1"  (185.4 cm) Weight: 160 lb (72.6 kg) IBW/kg (Calculated) : 79.9  Temp (24hrs), Avg:98.3 F (36.8 C), Min:98.3 F (36.8 C), Max:98.3 F (36.8 C)  Recent Labs  Lab 10/15/17 1842  WBC 26.7*  CREATININE 2.09*    Estimated Creatinine Clearance: 28.9 mL/min (A) (by C-G formula based on SCr of 2.09 mg/dL (H)).    No Known Allergies  Antimicrobials this admission: 7/23 >> Cefepime >>  Dose adjustments this admission: --  Microbiology results: 7/23 UCx: sent    Thank you for allowing pharmacy to be a part of this patient's care.   Lindell Spar, PharmD, BCPS Pager: [phone] 10/15/2017 9:19 PM

## 2017-10-15 NOTE — ED Notes (Signed)
Report given to [facility]

## 2017-10-15 NOTE — ED Provider Notes (Signed)
[place] DEPT Provider Note   CSN: [id number] Arrival date & time: 10/15/17  1617     History   Chief Complaint Chief Complaint  Patient presents with  . Urinary Retention    HPI [name] is a 83 y.o. male.  HPI 83 year old male with extensive past medical history including recent admission for urinary retention and AKI complicated by extraperitoneal bladder rupture here with urinary retention.  Patient had 300 cc of urine out overnight.  Since this morning, he has had a small amount of blood in his Foley catheter, but otherwise has not had any urine output.  Patient is complained of increasing lower abdominal pain and pressure.  Patient reportedly had a history of recent rupture, and per review of records, had difficulty due to passage of blood clots during his admission.  He is not wanted to eat or drink today.  Remainder of history limited due to dementia.  Level 5 caveat invoked as remainder of history, ROS, and physical exam limited due to patient's dementia.   Past Medical History:  Diagnosis Date  . Alzheimer's dementia 2013  . Anemia    HX OF ANEMIA  . CHF (congestive heart failure) ([place])   . Colon cancer ([place])   . COPD (chronic obstructive pulmonary disease) ([place])   . Depression   . Dyspnea   . Hyperlipidemia   . Hypertension   . Pneumonia 2014  . PVD (peripheral vascular disease) ([place])   . Type II diabetes mellitus [facility])     Patient Active Problem List   Diagnosis Date Noted  . Urinary bladder disorder 10/05/2017  . Acute urinary retention 10/05/2017  . Metabolic acidosis 40/34/7425  . Metabolic encephalopathy 95/63/8756  . Accelerated hypertension 10/05/2017  . Abdominal distention   . AKI (acute kidney injury) ([place])   . Goals of care, counseling/discussion   . Adynamic ileus ([place])   . Palliative care encounter   . Pressure injury of skin 09/25/2017  . Hematuria 09/19/2017  . Gross hematuria 09/19/2017  . Sepsis  ([place]) 05/02/2017  . Diabetes mellitus ([place]) 12/25/2013  . Type I (juvenile type) diabetes mellitus with peripheral circulatory disorders, not stated as uncontrolled(250.71) 12/18/2013  . Acute DVT (deep venous thrombosis) ([place]) 12/12/2013  . PNA (pneumonia) 12/11/2013  . Candida esophagitis ([place]) 09/06/2013  . Chronic systolic CHF (congestive heart failure) ([place]) 09/06/2013  . CAP (community acquired pneumonia) 09/05/2013  . COPD exacerbation ([place]) 09/04/2013  . Microcytic anemia 09/03/2013  . Chronic respiratory failure with hypoxia ([place]) 09/01/2013  . Symptomatic anemia 09/01/2013  . DOE (dyspnea on exertion) 09/01/2013  . Atherosclerosis of native arteries of the extremities with ulceration ([place]) 06/24/2013  . PAD (peripheral artery disease) ([place]) 04/28/2013  . Chest pain 03/23/2013  . Nausea with vomiting 03/23/2013  . Pain in joint, ankle and foot 03/23/2013  . Dementia 07/04/2012  . URI (upper respiratory infection) 07/04/2012  . PVD (peripheral vascular disease) ([place]) 04/21/2012  . Leg pain 04/18/2012  . Upper airway cough syndrome 06/02/2007  . Hyperlipidemia 05/07/2007  . PVD 05/07/2007  . COPD COPD II  05/07/2007  . DYSPNEA 05/07/2007  . Essential hypertension 05/07/2007    Past Surgical History:  Procedure Laterality Date  . ABDOMINAL ANGIOGRAM N/A 04/20/2013   Procedure: ABDOMINAL ANGIOGRAM;  Surgeon: Angelia Mould, MD;  Location: [facility] CATH LAB;  Service: Cardiovascular;  Laterality: N/A;  . ABDOMINAL AORTOGRAM W/LOWER EXTREMITY N/A 11/20/2016   Procedure: ABDOMINAL AORTOGRAM W/LOWER EXTREMITY;  Surgeon: Serafina Mitchell,  MD;  Location: [place] CV LAB;  Service: Cardiovascular;  Laterality: N/A;  . CATARACT EXTRACTION Right 02/2013  . CATARACT EXTRACTION W/ INTRAOCULAR LENS IMPLANT Left 01/2013  . COLON RESECTION  1997   /enc. notes 05/17/2004 (04/28/2013)  . COLON SURGERY  1996   cancer  . ESOPHAGOGASTRODUODENOSCOPY N/A 09/05/2013   Procedure:  ESOPHAGOGASTRODUODENOSCOPY (EGD);  Surgeon: Lear Ng, MD;  Location: [facility] ENDOSCOPY;  Service: Endoscopy;  Laterality: N/A;  . FEMORAL ARTERY - FEMORAL ARTERY BYPASS GRAFT     right to left/enc. notes 05/17/2004 (04/28/2013)  . FEMORAL BYPASS Right    /enc. notes 05/17/2004 (04/28/2013)  . FEMORAL-POPLITEAL BYPASS GRAFT Right 04/29/2013   Procedure:  FEMORAL-POPLITEAL ARTERY Bypass Graft with intraoperative ultrasound and arteriogarm;  Surgeon: Mal Misty, MD;  Location: [place];  Service: Vascular;  Laterality: Right;  . FIBEROPTIC BRONCHOSCOPY     Yvette Rack. notes 03/16/2005 (04/28/2013)  . INTRAOPERATIVE ARTERIOGRAM Right 04/29/2013   Procedure: INTRA OPERATIVE ARTERIOGRAM;  Surgeon: Mal Misty, MD;  Location: [place];  Service: Vascular;  Laterality: Right;  . LOWER EXTREMITY ANGIOGRAM  04/20/2013   Procedure: LOWER EXTREMITY ANGIOGRAM;  Surgeon: Angelia Mould, MD;  Location: [facility] CATH LAB;  Service: Cardiovascular;;  . LOWER EXTREMITY ANGIOGRAPHY Bilateral 06/25/2017   Procedure: LOWER EXTREMITY ANGIOGRAPHY;  Surgeon: Serafina Mitchell, MD;  Location: [place] CV LAB;  Service: Cardiovascular;  Laterality: Bilateral;  . [place] SURGERY  2005  . PERCUTANEOUS STENT INTERVENTION  04/20/2013   Procedure: PERCUTANEOUS STENT INTERVENTION;  Surgeon: Angelia Mould, MD;  Location: [facility] CATH LAB;  Service: Cardiovascular;;  right common iliac artery  . PERIPHERAL VASCULAR CATHETERIZATION N/A 01/31/2016   Procedure: Abdominal Aortogram w/Lower Extremity;  Surgeon: Serafina Mitchell, MD;  Location: [place] CV LAB;  Service: Cardiovascular;  Laterality: N/A;  . PERIPHERAL VASCULAR CATHETERIZATION Right 01/31/2016   Procedure: Peripheral Vascular Atherectomy;  Surgeon: Serafina Mitchell, MD;  Location: [place] CV LAB;  Service: Cardiovascular;  Laterality: Right;  Iliac common and external  . PERIPHERAL VASCULAR CATHETERIZATION Right 01/31/2016   Procedure: Peripheral Vascular Intervention;   Surgeon: Serafina Mitchell, MD;  Location: [place] CV LAB;  Service: Cardiovascular;  Laterality: Right;  external iliac and common iliac  . PERIPHERAL VASCULAR CATHETERIZATION N/A 04/24/2016   Procedure: Abdominal Aortogram w/Lower Extremity;  Surgeon: Serafina Mitchell, MD;  Location: [place] CV LAB;  Service: Cardiovascular;  Laterality: N/A;  . PERIPHERAL VASCULAR CATHETERIZATION Right 04/24/2016   Procedure: Peripheral Vascular Intervention;  Surgeon: Serafina Mitchell, MD;  Location: [place] CV LAB;  Service: Cardiovascular;  Laterality: Right;  external illiac Rt  . SPINE SURGERY    . VASCULAR SURGERY          Home Medications    Prior to Admission medications   Medication Sig Start Date End Date Taking? Authorizing Provider  acetaminophen (TYLENOL) 500 MG tablet Take 500 mg by mouth every 6 (six) hours as needed for mild pain.    Yes [provider]  albuterol (PROVENTIL) (2.5 MG/3ML) 0.083% nebulizer solution Take 2.5 mg by nebulization every 6 (six) hours as needed for wheezing or shortness of breath.   Yes [provider]  bisacodyl (DULCOLAX) 10 MG suppository Place 10 mg rectally daily as needed for moderate constipation (If Milk of Magnesia doesn't work).   Yes [provider]  BREO ELLIPTA 100-25 MCG/INH AEPB TAKE 1 PUFF BY MOUTH EVERY DAY 09/09/17  Yes Tanda Rockers, MD  cefUROXime (CEFTIN) 500 MG tablet Take 1 tablet (500 mg total) by mouth 2 (two) times daily with a meal for 14 days. 10/01/17 10/15/17 Yes Rai, Ripudeep K, MD  clopidogrel (PLAVIX) 75 MG tablet TAKE 1 TABLET BY MOUTH DAILY. Patient taking differently: TAKE 75 MG BY MOUTH DAILY. 05/28/17  Yes Waynetta Sandy, MD  co-enzyme Q-10 30 MG capsule Take 30 mg by mouth at bedtime.   Yes [provider]  dextromethorphan-guaiFENesin (MUCINEX DM) 30-600 MG 12hr tablet Take 1 tablet by mouth 2 (two) times daily for 14 days. 10/01/17 10/15/17 Yes Rai, Ripudeep K, MD  famotidine  (PEPCID) 20 MG tablet One at bedtime Patient taking differently: Take 20 mg by mouth daily. One at bedtime 04/26/17  Yes Tanda Rockers, MD  furosemide (LASIX) 20 MG tablet Take 1 tablet (20 mg total) by mouth every Monday, Wednesday, and Friday. 10/02/17  Yes Florencia Reasons, MD  GLUCERNA [facility]) LIQD Take 237 mLs by mouth 3 (three) times daily with meals. Chocolate   Yes [provider]  hydrALAZINE (APRESOLINE) 10 MG tablet Take 1 tablet (10 mg total) by mouth 3 (three) times daily. 10/01/17  Yes Rai, Ripudeep K, MD  magnesium hydroxide (MILK OF MAGNESIA) 400 MG/5ML suspension Take 30 mLs by mouth daily as needed for mild constipation.   Yes [provider]  metoprolol tartrate (LOPRESSOR) 25 MG tablet Take 1 tablet (25 mg total) by mouth 2 (two) times daily. 10/01/17  Yes Rai, Ripudeep K, MD  multivitamin-iron-minerals-folic acid (CENTRUM) chewable tablet Chew 1 tablet by mouth at bedtime.   Yes [provider]  omega-3 acid ethyl esters (LOVAZA) 1 g capsule Take by mouth at bedtime.   Yes [provider]  ondansetron (ZOFRAN) 4 MG tablet Take 1 tablet (4 mg total) by mouth every 6 (six) hours as needed for nausea. 10/01/17  Yes Rai, Ripudeep K, MD  pravastatin (PRAVACHOL) 80 MG tablet Take 80 mg by mouth daily. 07/17/17  Yes [provider]  sitaGLIPtin (JANUVIA) 100 MG tablet Take 100 mg by mouth daily.   Yes [provider]  Sodium Phosphates (RA SALINE ENEMA) 19-7 GM/118ML ENEM Place 1 application rectally daily as needed (If Milk of Magnesia and Bisacodyl don't work).   Yes [provider]    Family History Family History  Problem Relation Age of Onset  . Stroke Mother 85  . Hypertension Mother   . Cancer Sister   . Heart disease Sister   . Heart disease Brother        Heart Disease before age 63  . Heart attack Brother   . Cancer Sister   . Cancer Brother     Social History Social History   Tobacco Use  . Smoking status:  Former Smoker    Packs/day: 0.30    Years: 40.00    Pack years: 12.00    Types: Cigarettes    Last attempt to quit: 03/26/1993    Years since quitting: 24.5  . Smokeless tobacco: Never Used  Substance Use Topics  . Alcohol use: No  . Drug use: No     Allergies   Patient has no known allergies.   Review of Systems Review of Systems  Unable to perform ROS: Dementia  Gastrointestinal: Positive for abdominal pain and nausea.  Genitourinary: Positive for decreased urine volume.     Physical Exam Updated Vital Signs BP (!) 132/58 (BP Location: Right Arm)   Pulse 78   Temp 99.1 F (37.3 C) (  Oral)   Resp 19   Ht 6\' 1"  (1.854 m)   Wt 72.6 kg (160 lb)   SpO2 99%   BMI 21.11 kg/m   Physical Exam  Constitutional: He appears well-developed and well-nourished. No distress.  HENT:  Head: Normocephalic and atraumatic.  Eyes: Conjunctivae are normal.  Neck: Neck supple.  Cardiovascular: Normal rate, regular rhythm and normal heart sounds. Exam reveals no friction rub.  No murmur heard. Pulmonary/Chest: Effort normal and breath sounds normal. No respiratory distress. He has no wheezes. He has no rales.  Abdominal: He exhibits no distension.  Significant lower abdominal fullness, with palpable bladder that is diffusely tender  Genitourinary:  Genitourinary Comments: Foley catheter in place, no drainage noted.  Small amount of blood noted in the Foley tubing.  Musculoskeletal: He exhibits no edema.  Neurological: He is alert. He exhibits normal muscle tone.  Oriented to person only, which is reportedly baseline  Skin: Skin is warm. Capillary refill takes less than 2 seconds.  Psychiatric: He has a normal mood and affect.  Nursing note and vitals reviewed.    ED Treatments / Results  Labs (all labs ordered are listed, but only abnormal results are displayed) Labs Reviewed  CBC WITH DIFFERENTIAL/PLATELET - Abnormal; Notable for the following components:      Result Value    WBC 26.7 (*)    RDW 15.6 (*)    Platelets 492 (*)    Neutro Abs 23.8 (*)    Monocytes Absolute 2.1 (*)    All other components within normal limits  BASIC METABOLIC PANEL - Abnormal; Notable for the following components:   Potassium 5.8 (*)    Glucose, Bld 209 (*)    BUN 33 (*)    Creatinine, Ser 2.09 (*)    GFR calc non Af Amer 28 (*)    GFR calc Af Amer 33 (*)    All other components within normal limits  URINALYSIS, ROUTINE W REFLEX MICROSCOPIC - Abnormal; Notable for the following components:   Color, Urine RED (*)    APPearance TURBID (*)    Glucose, UA   (*)    Value: TEST NOT REPORTED DUE TO COLOR INTERFERENCE OF URINE PIGMENT   Hgb urine dipstick   (*)    Value: TEST NOT REPORTED DUE TO COLOR INTERFERENCE OF URINE PIGMENT   Bilirubin Urine   (*)    Value: TEST NOT REPORTED DUE TO COLOR INTERFERENCE OF URINE PIGMENT   Ketones, ur   (*)    Value: TEST NOT REPORTED DUE TO COLOR INTERFERENCE OF URINE PIGMENT   Protein, ur   (*)    Value: TEST NOT REPORTED DUE TO COLOR INTERFERENCE OF URINE PIGMENT   Nitrite   (*)    Value: TEST NOT REPORTED DUE TO COLOR INTERFERENCE OF URINE PIGMENT   Leukocytes, UA   (*)    Value: TEST NOT REPORTED DUE TO COLOR INTERFERENCE OF URINE PIGMENT   All other components within normal limits  URINALYSIS, MICROSCOPIC (REFLEX) - Abnormal; Notable for the following components:   Bacteria, UA FEW (*)    All other components within normal limits  URINE CULTURE  BASIC METABOLIC PANEL  CBC    EKG EKG Interpretation  Date/Time:  Tuesday October 15 2017 20:12:08 EDT Ventricular Rate:  77 PR Interval:    QRS Duration: 74 QT Interval:  426 QTC Calculation: 482 R Axis:   62 Text Interpretation:  Sinus rhythm with A-V dissociation and Accelerated Junctional rhythm ST &  T wave abnormality, consider inferior ischemia Prolonged QT Abnormal ECG No significant change since last tracing Confirmed by Duffy Bruce [phone]) on 10/15/2017 8:33:09  PM   Radiology Ct Abdomen Pelvis Wo Contrast  Result Date: 10/15/2017 CLINICAL DATA:  Hematuria decreased urine output EXAM: CT ABDOMEN AND PELVIS WITHOUT CONTRAST TECHNIQUE: Multidetector CT imaging of the abdomen and pelvis was performed following the standard protocol without IV contrast. COMPARISON:  09/24/2017 FINDINGS: Lower chest: Chronic changes are noted in the bases bilaterally stable from the prior exam. Hepatobiliary: No focal liver abnormality is seen. No gallstones, gallbladder wall thickening, or biliary dilatation. Pancreas: Unremarkable. No pancreatic ductal dilatation or surrounding inflammatory changes. Spleen: Normal in size without focal abnormality. Adrenals/Urinary Tract: Adrenal glands are within normal limits. The kidneys are well visualized bilaterally without renal calculi or urinary tract obstructive changes. Hypodensity is again identified from the inferior aspect of the left kidney likely representing small cyst. The bladder is decompressed by Foley catheter. Considerable amount of air is noted within the bladder as well. The changes suggestive of bladder rupture adjacent to the dome of the bladder seen on the prior exam are no longer identified. Stomach/Bowel: Stomach is within normal limits. Appendix appears normal. No evidence of bowel wall thickening, distention, or inflammatory changes. Postsurgical changes in the distal colon are noted. Vascular/Lymphatic: Aortic atherosclerosis. No enlarged abdominal or pelvic lymph nodes. Reproductive: Prostate is unremarkable. Other: No abdominal wall hernia or abnormality. No abdominopelvic ascites. Musculoskeletal: Postsurgical changes at L4-5 are noted. IMPRESSION: Previously seen changes suggestive of bladder rupture have resolved in the interval from the prior exam. Bladder is decompressed by Foley catheter. No new focal abnormality is noted. Electronically Signed   By: Inez Catalina M.D.   On: 10/15/2017 21:39   Dg Chest 2  View  Result Date: 10/15/2017 CLINICAL DATA:  Cough EXAM: CHEST - 2 VIEW COMPARISON:  09/21/2017 FINDINGS: Fine reticular opacities at the right base correlates with pulmonary microcalcifications on abdominal CT 09/24/2017. No edema or consolidation. No effusion or pneumothorax. Normal heart size and mediastinal contours. Atherosclerotic calcification. IMPRESSION: No acute finding when compared to priors. Electronically Signed   By: Monte Fantasia M.D.   On: 10/15/2017 19:12   Dg Abd 2 Views  Result Date: 10/15/2017 CLINICAL DATA:  Ileus.  Decreased urine output EXAM: ABDOMEN - 2 VIEW COMPARISON:  09/28/2017 FINDINGS: Normal bowel gas pattern. No excessive stool retention. Interspinous spacer at L4-5. Clips and bowel sutures in the pelvis with right iliac stenting. History of Foley catheter with none visualized on this study. The visualized lung bases are clear. IMPRESSION: Normal bowel gas pattern. Electronically Signed   By: Monte Fantasia M.D.   On: 10/15/2017 19:09    Procedures Procedures (including critical care time)  Medications Ordered in ED Medications  albuterol (PROVENTIL) (2.5 MG/3ML) 0.083% nebulizer solution 2.5 mg (has no administration in time range)  fluticasone furoate-vilanterol (BREO ELLIPTA) 100-25 MCG/INH 1 puff (has no administration in time range)  dextromethorphan-guaiFENesin (MUCINEX DM) 30-600 MG per 12 hr tablet 1 tablet (has no administration in time range)  famotidine (PEPCID) tablet 20 mg (has no administration in time range)  feeding supplement (GLUCERNA SHAKE) (GLUCERNA SHAKE) liquid 237 mL (has no administration in time range)  hydrALAZINE (APRESOLINE) tablet 10 mg (has no administration in time range)  metoprolol tartrate (LOPRESSOR) tablet 25 mg (has no administration in time range)  pravastatin (PRAVACHOL) tablet 80 mg (has no administration in time range)  linagliptin (TRADJENTA) tablet 5 mg (has no administration  in time range)  0.9 %  sodium chloride  infusion (has no administration in time range)  acetaminophen (TYLENOL) tablet 650 mg (has no administration in time range)    Or  acetaminophen (TYLENOL) suppository 650 mg (has no administration in time range)  traMADol (ULTRAM) tablet 50 mg (has no administration in time range)  ondansetron (ZOFRAN) tablet 4 mg (has no administration in time range)    Or  ondansetron (ZOFRAN) injection 4 mg (has no administration in time range)  polyethylene glycol (MIRALAX / GLYCOLAX) packet 17 g (has no administration in time range)  insulin aspart (novoLOG) injection 0-9 Units (has no administration in time range)  ceFEPIme (MAXIPIME) 1 g in sodium chloride 0.9 % 100 mL IVPB (has no administration in time range)  lidocaine (XYLOCAINE) 2 % jelly 1 application (1 application Urethral Given 10/15/17 1756)  sodium chloride 0.9 % bolus 1,000 mL (0 mLs Intravenous Stopped 10/15/17 2032)  0.9 %  sodium chloride infusion ( Intravenous New Bag/Given 10/15/17 2033)  ceFEPIme (MAXIPIME) 1 g in sodium chloride 0.9 % 100 mL IVPB (0 g Intravenous Stopped 10/15/17 2118)     Initial Impression / Assessment and Plan / ED Course  I have reviewed the triage vital signs and the nursing notes.  Pertinent labs & imaging results that were available during my care of the patient were reviewed by me and considered in my medical decision making (see chart for details).     83 year old male with past medical history of recent hospitalization for AKI and urinary retention complicated by extraperitoneal bladder rupture here with decreased drainage from his catheter.  I suspect he may have recurrent UTI or clotting causing obstruction of his catheter.  I discussed with Dr. Fonda Kinder of urology, who states that first plan would be to attempt replacement of the Foley, which can be done safely in the emergency department by nursing per his report.  Foley catheter subsequently replaced uneventfully.  Patient tolerated very well.  He drained  approximately 500 cc of cloudy bloody urine.  Lab work is concerning for recurrent acute kidney injury secondary to obstruction, as well as significant leukocytosis and concern for recurrent UTI.  Will give cefepime for broader coverage, plan for admission for IV fluids and monitoring.  Final Clinical Impressions(s) / ED Diagnoses   Final diagnoses:  Ileus [facility])    ED Discharge Orders    None       Duffy Bruce, MD 10/15/17 [phone]

## 2017-10-15 NOTE — ED Triage Notes (Signed)
Patient is from [place] and transported via [facility]. Patient has a foley catheter with decreased urine output and blood in the catheter. Last night, the facility reported he had 335mL of urine output. Today, he has had no output. Facility spoke with urologist, and he would like to follow up in the emergency department.

## 2017-10-16 DIAGNOSIS — I739 Peripheral vascular disease, unspecified: Secondary | ICD-10-CM

## 2017-10-16 DIAGNOSIS — F0390 Unspecified dementia without behavioral disturbance: Secondary | ICD-10-CM

## 2017-10-16 DIAGNOSIS — I5022 Chronic systolic (congestive) heart failure: Secondary | ICD-10-CM

## 2017-10-16 DIAGNOSIS — R310 Gross hematuria: Secondary | ICD-10-CM

## 2017-10-16 DIAGNOSIS — N179 Acute kidney failure, unspecified: Principal | ICD-10-CM

## 2017-10-16 LAB — BASIC METABOLIC PANEL
Anion gap: 7 (ref 5–15)
BUN: 24 mg/dL — AB (ref 8–23)
CO2: 25 mmol/L (ref 22–32)
Calcium: 8.9 mg/dL (ref 8.9–10.3)
Chloride: 109 mmol/L (ref 98–111)
Creatinine, Ser: 1.03 mg/dL (ref 0.61–1.24)
GFR calc Af Amer: >60 mL/min (ref >60)
GFR calc non Af Amer: >60 mL/min (ref >60)
GLUCOSE: 139 mg/dL — AB (ref 70–99)
POTASSIUM: 4.4 mmol/L (ref 3.5–5.1)
SODIUM: 141 mmol/L (ref 135–145)

## 2017-10-16 LAB — CBC
HEMATOCRIT: 33.9 % — AB (ref 39.0–52.0)
Hemoglobin: 10.7 g/dL — ABNORMAL LOW (ref 13.0–17.0)
MCH: 27.7 pg (ref 26.0–34.0)
MCHC: 31.6 g/dL (ref 30.0–36.0)
MCV: 87.8 fL (ref 78.0–100.0)
Platelets: 405 10*3/uL — ABNORMAL HIGH (ref 150–400)
RBC: 3.86 MIL/uL — ABNORMAL LOW (ref 4.22–5.81)
RDW: 15.8 % — AB (ref 11.5–15.5)
WBC: 18.4 10*3/uL — AB (ref 4.0–10.5)

## 2017-10-16 LAB — GLUCOSE, CAPILLARY
GLUCOSE-CAPILLARY: 105 mg/dL — AB (ref 70–99)
GLUCOSE-CAPILLARY: 107 mg/dL — AB (ref 70–99)
GLUCOSE-CAPILLARY: 120 mg/dL — AB (ref 70–99)
Glucose-Capillary: 167 mg/dL — ABNORMAL HIGH (ref 70–99)
Glucose-Capillary: 68 mg/dL — ABNORMAL LOW (ref 70–99)
Glucose-Capillary: 98 mg/dL (ref 70–99)

## 2017-10-16 LAB — MRSA PCR SCREENING: MRSA by PCR: NEGATIVE

## 2017-10-16 MED ORDER — ORAL CARE MOUTH RINSE
15.0000 mL | Freq: Two times a day (BID) | OROMUCOSAL | Status: DC
  Administered 2017-10-16 – 2017-10-18 (×5): 15 mL via OROMUCOSAL

## 2017-10-16 MED ORDER — INSULIN ASPART 100 UNIT/ML ~~LOC~~ SOLN
0.0000 [IU] | SUBCUTANEOUS | Status: DC
  Administered 2017-10-17: 1 [IU] via SUBCUTANEOUS
  Administered 2017-10-17: 2 [IU] via SUBCUTANEOUS
  Administered 2017-10-18 (×2): 1 [IU] via SUBCUTANEOUS

## 2017-10-16 MED ORDER — DEXTROSE-NACL 5-0.45 % IV SOLN
INTRAVENOUS | Status: DC
  Administered 2017-10-16 – 2017-10-17 (×2): via INTRAVENOUS

## 2017-10-16 MED ORDER — CEFEPIME HCL 1 G IJ SOLR
1.0000 g | Freq: Three times a day (TID) | INTRAMUSCULAR | Status: DC
Start: 2017-10-16 — End: 2017-10-18
  Administered 2017-10-16 – 2017-10-18 (×6): 1 g via INTRAVENOUS
  Filled 2017-10-16 (×7): qty 1

## 2017-10-16 NOTE — NC FL2 (Signed)
[place] LEVEL OF CARE SCREENING TOOL     IDENTIFICATION  Patient Name: [name] Birthdate: 12/10/1934 Sex: male Admission Date (Current Location): 10/15/2017  [facility] and [place] Number:  Herbalist and Address:  [facility],  [place], [place]      Provider Number: [id number]  Attending Physician Name and Address:  Mariel Aloe, MD  Relative Name and Phone Number:  [name], spouse, [phone]    Current Level of Care: Hospital Recommended Level of Care: [place] Prior Approval Number:    Date Approved/Denied:   PASRR Number: [id number]  Discharge Plan: SNF    Current Diagnoses: Patient Active Problem List   Diagnosis Date Noted  . Urinary bladder disorder 10/05/2017  . Acute urinary retention 10/05/2017  . Metabolic acidosis 53/66/4403  . Metabolic encephalopathy 47/42/5956  . Accelerated hypertension 10/05/2017  . Abdominal distention   . AKI (acute kidney injury) ([place])   . Goals of care, counseling/discussion   . Adynamic ileus ([place])   . Palliative care encounter   . Pressure injury of skin 09/25/2017  . Hematuria 09/19/2017  . Gross hematuria 09/19/2017  . Sepsis ([place]) 05/02/2017  . Diabetes mellitus ([place]) 12/25/2013  . Type I (juvenile type) diabetes mellitus with peripheral circulatory disorders, not stated as uncontrolled(250.71) 12/18/2013  . Acute DVT (deep venous thrombosis) ([place]) 12/12/2013  . PNA (pneumonia) 12/11/2013  . Candida esophagitis ([place]) 09/06/2013  . Chronic systolic CHF (congestive heart failure) ([place]) 09/06/2013  . CAP (community acquired pneumonia) 09/05/2013  . COPD exacerbation ([place]) 09/04/2013  . Microcytic anemia 09/03/2013  . Chronic respiratory failure with hypoxia ([place]) 09/01/2013  . Symptomatic anemia 09/01/2013  . DOE (dyspnea on exertion) 09/01/2013  . Atherosclerosis of native arteries of the extremities with ulceration ([place]) 06/24/2013  .  PAD (peripheral artery disease) ([place]) 04/28/2013  . Chest pain 03/23/2013  . Nausea with vomiting 03/23/2013  . Pain in joint, ankle and foot 03/23/2013  . Dementia 07/04/2012  . URI (upper respiratory infection) 07/04/2012  . PVD (peripheral vascular disease) ([place]) 04/21/2012  . Leg pain 04/18/2012  . Upper airway cough syndrome 06/02/2007  . Hyperlipidemia 05/07/2007  . PVD 05/07/2007  . COPD COPD II  05/07/2007  . DYSPNEA 05/07/2007  . Essential hypertension 05/07/2007    Orientation RESPIRATION BLADDER Height & Weight     Self  Normal Indwelling catheter Weight: 160 lb (72.6 kg) Height:  6\' 1"  (185.4 cm)  BEHAVIORAL SYMPTOMS/MOOD NEUROLOGICAL BOWEL NUTRITION STATUS      Incontinent Diet(heart healthy/carb modified)  AMBULATORY STATUS COMMUNICATION OF NEEDS Skin   Extensive Assist Verbally Other (Comment)(PressureInjuryStageI-Intactskinwithnon-blanchablerednessofalocalizedareausuallyoverabonyprominence.right/left heel;)                       Personal Care Assistance Level of Assistance  Bathing, Feeding, Dressing Bathing Assistance: Maximum assistance Feeding assistance: Maximum assistance Dressing Assistance: Maximum assistance     Functional Limitations Info  Sight, Hearing, Speech Sight Info: Adequate Hearing Info: Adequate Speech Info: Adequate    SPECIAL CARE FACTORS FREQUENCY  PT (By licensed PT), OT (By licensed OT)     PT Frequency: 5x/week OT Frequency: 5x/week            Contractures Contractures Info: Not present    Additional Factors Info  Code Status, Allergies Code Status Info: DNR Allergies Info: NKA           Current Medications (10/16/2017):  This is the current hospital active medication  list Current Facility-Administered Medications  Medication Dose Route Frequency Provider Last Rate Last Dose  . acetaminophen (TYLENOL) tablet 650 mg  650 mg Oral Q6H PRN Emokpae, Ejiroghene E, MD       Or  . acetaminophen  (TYLENOL) suppository 650 mg  650 mg Rectal Q6H PRN Emokpae, Ejiroghene E, MD      . albuterol (PROVENTIL) (2.5 MG/3ML) 0.083% nebulizer solution 2.5 mg  2.5 mg Nebulization Q6H PRN Emokpae, Ejiroghene E, MD      . ceFEPIme (MAXIPIME) 1 g in sodium chloride 0.9 % 100 mL IVPB  1 g Intravenous Q8H Berton Mount, RPH   Stopped at 10/16/17 1242  . dextromethorphan-guaiFENesin (MUCINEX DM) 30-600 MG per 12 hr tablet 1 tablet  1 tablet Oral BID Emokpae, Ejiroghene E, MD   1 tablet at 10/16/17 1053  . famotidine (PEPCID) tablet 20 mg  20 mg Oral Daily Emokpae, Ejiroghene E, MD   20 mg at 10/16/17 1054  . fluticasone furoate-vilanterol (BREO ELLIPTA) 100-25 MCG/INH 1 puff  1 puff Inhalation Daily Emokpae, Ejiroghene E, MD   1 puff at 10/16/17 0923  . hydrALAZINE (APRESOLINE) tablet 10 mg  10 mg Oral TID Emokpae, Ejiroghene E, MD   10 mg at 10/16/17 1056  . insulin aspart (novoLOG) injection 0-9 Units  0-9 Units Subcutaneous TID WC Emokpae, Ejiroghene E, MD   1 Units at 10/16/17 1242  . linagliptin (TRADJENTA) tablet 5 mg  5 mg Oral Daily Emokpae, Ejiroghene E, MD   5 mg at 10/16/17 1055  . MEDLINE mouth rinse  15 mL Mouth Rinse BID Emokpae, Ejiroghene E, MD   15 mL at 10/16/17 1055  . metoprolol tartrate (LOPRESSOR) tablet 25 mg  25 mg Oral BID Emokpae, Ejiroghene E, MD   25 mg at 10/16/17 1055  . ondansetron (ZOFRAN) tablet 4 mg  4 mg Oral Q6H PRN Emokpae, Ejiroghene E, MD       Or  . ondansetron (ZOFRAN) injection 4 mg  4 mg Intravenous Q6H PRN Emokpae, Ejiroghene E, MD      . polyethylene glycol (MIRALAX / GLYCOLAX) packet 17 g  17 g Oral Daily PRN Emokpae, Ejiroghene E, MD      . pravastatin (PRAVACHOL) tablet 80 mg  80 mg Oral q1800 Emokpae, Ejiroghene E, MD      . traMADol (ULTRAM) tablet 50 mg  50 mg Oral Q12H PRN Emokpae, Ejiroghene E, MD         Discharge Medications: Please see discharge summary for a list of discharge medications.  Relevant Imaging Results:  Relevant Lab  Results:   Additional Information SS# [id number]  Burnis Medin, LCSW

## 2017-10-16 NOTE — Progress Notes (Signed)
Patient had 6 runs of V-tach, no pain/distress noted, patient denies any pain as well (hx of dementia). Vital 140/62, 80, 100%-RA. K.Schorr-NP notified, no new order given, will continue to monitor patient.

## 2017-10-16 NOTE — Progress Notes (Signed)
Pt coughing at intermittently when taken in oral liquids, meat at breakfast chopped due to coughing. MD made aware, speech therapy consult ordered. SRP, RN

## 2017-10-16 NOTE — Clinical Social Work Note (Signed)
Patient recently assessed on 09/23/2017, no psychosocial changes reported see assessment below. CSW consulted to assist with discharge planning. Patient admitted from [facility] where patient was getting [place] rehab. CSW spoke with patient's wife at bedside who reported that her goal is to transition patient from [place] rehab to [name] term care. Patient's wife reported that she is unable to care for patient in the home. Patient's wife reported that she applied for Medicaid on 10/11/17. CSW informed patient's wife that [facility] does not currently have any [name] term care beds and discussed patient's transition. Patient's wife reported that she is agreeable to patient going to another SNF to complete ST rehab that also has [name] term care beds. CSW agreed to complete patient's FL2 and seek a [name] term care Medicaid pending bed for patient. CSW will continue to follow and assist with discharge planning.  Clinical Social Work Assessment  Patient Details  Name: [name] MRN: [id number] Date of Birth: 1935-01-07  Date of referral:                  Reason for consult:                   Permission sought to share information with:    Permission granted to share information::     Name::        Agency::     Relationship::     Contact Information:     Housing/Transportation Living arrangements for the past 2 months:    Source of Information:    Patient Interpreter Needed:    Criminal Activity/Legal Involvement Pertinent to Current Situation/Hospitalization:    Significant Relationships:    Lives with:    Do you feel safe going back to the place where you live?    Need for family participation in patient care:     Care giving concerns:  CSW received consult for possible SNF placement at time of discharge. CSW spoke with patient's spouse at bedside regarding PT recommendation of SNF placement at time of discharge. Patient's spouse reported that patient has fallen at home and she is unable to pick  him up. She has to call the neighbor. She would like for patient to get rehab. CSW to continue to follow and assist with discharge planning needs.   Social Worker assessment / plan:  CSW spoke with patient's spouse concerning possibility of rehab at [facility] before returning home.  Employment status:    Insurance information:    PT Recommendations:    Information / Referral to community resources:     Patient/Family's Response to care:  Patient's spouse recognizes need for rehab before returning home and is agreeable to a SNF in [place]. Patient's spouse reported preference for [facility]. CSW spoke with her about the fact that patient was denied during his admission in April due to insurance stating he was custodial. She states she has tried to apply for Medicaid but she believes he was denied. CSW contacted CSW through [facility] and [facility] as well. They had been trying to place patient at [facility] but patient ended up in the hospital. CSW alerted patient's spouse that if insurance denied patient again, patient would be private pay for [name] term care. CSW attempting to contact Medicaid to see if patient has a Product/process development scientist. Patient's wife appears overwhelmed.   Patient/Family's Understanding of and Emotional Response to Diagnosis, Current Treatment, and Prognosis:  Patient/family is realistic regarding therapy needs and expressed being  hopeful for SNF placement. Patient's spouse expressed understanding of CSW role and discharge process as well as medical condition. No questions/concerns about plan or treatment.    Emotional Assessment Appearance:    Attitude/Demeanor/Rapport:    Affect (typically observed):    Orientation:    Alcohol / Substance use:    Psych involvement (Current and /or in the community):     Discharge Needs  Concerns to be addressed:    Readmission within the last 30 days:    Current discharge risk:    Barriers to Discharge:      Burnis Medin,  LCSW 10/16/2017, 3:07 PM

## 2017-10-16 NOTE — Progress Notes (Signed)
PROGRESS NOTE    ESTEPHAN GALLARDO  NLZ:[id number] DOB: 05/01/1934 DOA: 10/15/2017 PCP: Glendale Chard, MD   Brief Narrative: SEVIN LANGENBACH is a 83 y.o. male with a history of systolic heart failure, dementia, diabetes mellitus, COPD with chronic respiratory failure, hypertension, peripheral vascular disease, chronic Foley use.  Patient presents secondary to acute urinary retention.  Concern for all.  Patient with gross hematuria.  Treating for urinary tract infection.  Associated acute kidney injury has improved.   Assessment & Plan:   Principal Problem:   AKI (acute kidney injury) ([place]) Active Problems:   PVD   COPD COPD II    Dementia   Chronic systolic CHF (congestive heart failure) (HCC)   Diabetes mellitus ([place])   Gross hematuria   Acute kidney injury In setting of acute obstruction.  Resolved with Foley replacement in addition to IV fluids.  Baseline of 0.7. On admission, creatinine of 2.09 improved to 1.03 today.  Acute urinary retention Likely secondary to urinary tract infection in addition to occluded Foley catheter.  Associated gross hematuria.  On Plavix as an outpatient.  PAD Patient is status post femoral-femoral bypass graft and stent placement.  Patient is on Plavix as an outpatient. -Continue to hold Plavix  Leukocytosis Improved. In setting of likely UTI  UTI Urine culture pending. On Ceftin as an outpatient for  -Continue Cefepime  Hyperkalemia In setting of AKI. Resolved.  COPD Chronic respiratory failure with hypoxia -Continue Breo ellipta  Chronic combined systolic and diastolic heart failure Last EF of 20-25% with diffuse hypokinesis and grade 2 diastolic dysfunction. -Continue metoprolol  Essential hypertension -Continue hydralazine and metoprolol  Diabetes mellitus On Januvia as an outpatient -Continued DPP-4 -SSI  Hyperlipidemia -Continue pravastatin   DVT prophylaxis: SCDs Code Status:   Code Status: DNR Family Communication:  Wife on telephone Disposition Plan: Discharge back to SNF in 24 to 48 hours pending urine culture data   Consultants:   None  Procedures:   None  Antimicrobials:  Cefepime (7/23>>   Subjective: No concerns.  Objective: Vitals:   10/15/17 2021 10/15/17 2247 10/15/17 2249 10/16/17 0606  BP: 140/68 (!) 132/58  140/78  Pulse: 81 79 78 79  Resp: 20 19  20   Temp:  99.1 F (37.3 C)  97.6 F (36.4 C)  TempSrc:  Oral  Oral  SpO2: 100% (!) 89% 99% 100%  Weight:      Height:        Intake/Output Summary (Last 24 hours) at 10/16/2017 0812 Last data filed at 10/16/2017 0622 Gross per 24 hour  Intake 1428.33 ml  Output 1500 ml  Net -71.67 ml   Filed Weights   10/15/17 1637  Weight: 72.6 kg (160 lb)    Examination:  General exam: Appears calm and comfortable Respiratory system: Clear to auscultation. Respiratory effort normal. Cardiovascular system: S1 & S2 heard, RRR. No murmurs, rubs, gallops or clicks. Gastrointestinal system: Abdomen is nondistended, soft and nontender. No organomegaly or masses felt. Normal bowel sounds heard. Central nervous system: Alert and oriented to person. Extremities: No edema. No calf tenderness Skin: No cyanosis. No rashes Psychiatry: Judgement and insight appear normal. Mood & affect appropriate.     Data Reviewed: I have personally reviewed following labs and imaging studies  CBC: Recent Labs  Lab 10/15/17 1842 10/16/17 0455  WBC 26.7* 18.4*  NEUTROABS 23.8*  --   HGB 13.7 10.7*  HCT 42.2 33.9*  MCV 88.7 87.8  PLT 492* 902*   Basic Metabolic Panel:  Recent Labs  Lab 10/15/17 1842 10/16/17 0455  NA 141 141  K 5.8* 4.4  CL 104 109  CO2 23 25  GLUCOSE 209* 139*  BUN 33* 24*  CREATININE 2.09* 1.03  CALCIUM 10.0 8.9   GFR: Estimated Creatinine Clearance: 58.7 mL/min (by C-G formula based on SCr of 1.03 mg/dL). Liver Function Tests: No results for input(s): AST, ALT, ALKPHOS, BILITOT, PROT, ALBUMIN in the last 168  hours. No results for input(s): LIPASE, AMYLASE in the last 168 hours. No results for input(s): AMMONIA in the last 168 hours. Coagulation Profile: No results for input(s): INR, PROTIME in the last 168 hours. Cardiac Enzymes: No results for input(s): CKTOTAL, CKMB, CKMBINDEX, TROPONINI in the last 168 hours. BNP (last 3 results) No results for input(s): PROBNP in the last 8760 hours. HbA1C: No results for input(s): HGBA1C in the last 72 hours. CBG: Recent Labs  Lab 10/16/17 0810  GLUCAP 105*   Lipid Profile: No results for input(s): CHOL, HDL, LDLCALC, TRIG, CHOLHDL, LDLDIRECT in the last 72 hours. Thyroid Function Tests: No results for input(s): TSH, T4TOTAL, FREET4, T3FREE, THYROIDAB in the last 72 hours. Anemia Panel: No results for input(s): VITAMINB12, FOLATE, FERRITIN, TIBC, IRON, RETICCTPCT in the last 72 hours. Sepsis Labs: No results for input(s): PROCALCITON, LATICACIDVEN in the last 168 hours.  Recent Results (from the past 240 hour(s))  MRSA PCR Screening     Status: None   Collection Time: 10/16/17 12:03 AM  Result Value Ref Range Status   MRSA by PCR NEGATIVE NEGATIVE Final    Comment:        The GeneXpert MRSA Assay (FDA approved for NASAL specimens only), is one component of a comprehensive MRSA colonization surveillance program. It is not intended to diagnose MRSA infection nor to guide or monitor treatment for MRSA infections. Performed at [facility], [place] [address], [place], [place] [address]          Radiology Studies: Ct Abdomen Pelvis Wo Contrast  Result Date: 10/15/2017 CLINICAL DATA:  Hematuria decreased urine output EXAM: CT ABDOMEN AND PELVIS WITHOUT CONTRAST TECHNIQUE: Multidetector CT imaging of the abdomen and pelvis was performed following the standard protocol without IV contrast. COMPARISON:  09/24/2017 FINDINGS: Lower chest: Chronic changes are noted in the bases bilaterally stable from the prior exam.  Hepatobiliary: No focal liver abnormality is seen. No gallstones, gallbladder wall thickening, or biliary dilatation. Pancreas: Unremarkable. No pancreatic ductal dilatation or surrounding inflammatory changes. Spleen: Normal in size without focal abnormality. Adrenals/Urinary Tract: Adrenal glands are within normal limits. The kidneys are well visualized bilaterally without renal calculi or urinary tract obstructive changes. Hypodensity is again identified from the inferior aspect of the left kidney likely representing small cyst. The bladder is decompressed by Foley catheter. Considerable amount of air is noted within the bladder as well. The changes suggestive of bladder rupture adjacent to the dome of the bladder seen on the prior exam are no longer identified. Stomach/Bowel: Stomach is within normal limits. Appendix appears normal. No evidence of bowel wall thickening, distention, or inflammatory changes. Postsurgical changes in the distal colon are noted. Vascular/Lymphatic: Aortic atherosclerosis. No enlarged abdominal or pelvic lymph nodes. Reproductive: Prostate is unremarkable. Other: No abdominal wall hernia or abnormality. No abdominopelvic ascites. Musculoskeletal: Postsurgical changes at L4-5 are noted. IMPRESSION: Previously seen changes suggestive of bladder rupture have resolved in the interval from the prior exam. Bladder is decompressed by Foley catheter. No new focal abnormality is noted. Electronically Signed   By:  Inez Catalina M.D.   On: 10/15/2017 21:39   Dg Chest 2 View  Result Date: 10/15/2017 CLINICAL DATA:  Cough EXAM: CHEST - 2 VIEW COMPARISON:  09/21/2017 FINDINGS: Fine reticular opacities at the right base correlates with pulmonary microcalcifications on abdominal CT 09/24/2017. No edema or consolidation. No effusion or pneumothorax. Normal heart size and mediastinal contours. Atherosclerotic calcification. IMPRESSION: No acute finding when compared to priors. Electronically Signed    By: Monte Fantasia M.D.   On: 10/15/2017 19:12   Dg Abd 2 Views  Result Date: 10/15/2017 CLINICAL DATA:  Ileus.  Decreased urine output EXAM: ABDOMEN - 2 VIEW COMPARISON:  09/28/2017 FINDINGS: Normal bowel gas pattern. No excessive stool retention. Interspinous spacer at L4-5. Clips and bowel sutures in the pelvis with right iliac stenting. History of Foley catheter with none visualized on this study. The visualized lung bases are clear. IMPRESSION: Normal bowel gas pattern. Electronically Signed   By: Monte Fantasia M.D.   On: 10/15/2017 19:09        Scheduled Meds: . dextromethorphan-guaiFENesin  1 tablet Oral BID  . famotidine  20 mg Oral Daily  . feeding supplement (GLUCERNA SHAKE)  237 mL Oral TID WC  . fluticasone furoate-vilanterol  1 puff Inhalation Daily  . hydrALAZINE  10 mg Oral TID  . insulin aspart  0-9 Units Subcutaneous TID WC  . linagliptin  5 mg Oral Daily  . mouth rinse  15 mL Mouth Rinse BID  . metoprolol tartrate  25 mg Oral BID  . pravastatin  80 mg Oral q1800   Continuous Infusions: . sodium chloride 50 mL/hr at 10/15/17 2348  . ceFEPime (MAXIPIME) IV       LOS: 1 day     Cordelia Poche, MD [facility] 10/16/2017, 8:12 AM Pager: [phone]  If 7PM-7AM, please contact night-coverage [email or web address] 10/16/2017, 8:12 AM

## 2017-10-16 NOTE — Progress Notes (Signed)
Pharmacy Antibiotic Note  [name] is a 83 y.o. male presented from [place] on 10/15/2017 with no urine output via chronic indwelling foley.  Pharmacy has been consulted for Cefepime dosing for UTI.  Today, 10/16/2017 Day #1 cefepime  Renal function improved  WBC improved  Afebrile  UCx pending  Plan:  For improved renal function , change cefepime to 1g IV q8h. Monitor renal function, cultures, clinical course.   Height: 6\' 1"  (185.4 cm) Weight: 160 lb (72.6 kg) IBW/kg (Calculated) : 79.9  Temp (24hrs), Avg:98.3 F (36.8 C), Min:97.6 F (36.4 C), Max:99.1 F (37.3 C)  Recent Labs  Lab 10/15/17 1842 10/16/17 0455  WBC 26.7* 18.4*  CREATININE 2.09* 1.03    Estimated Creatinine Clearance: 58.7 mL/min (by C-G formula based on SCr of 1.03 mg/dL).    No Known Allergies  Antimicrobials this admission: 7/23 >> Cefepime >>  Dose adjustments this admission: --  Microbiology results: 7/23 UCx: sent    Thank you for allowing pharmacy to be a part of this patient's care.  Doreene Eland, PharmD, BCPS.   Pager: [phone] 10/16/2017 7:43 AM

## 2017-10-16 NOTE — Evaluation (Signed)
Physical Therapy Evaluation Patient Details Name: [name] MRN: [id number] DOB: 29-Jul-1934 Today's Date: 10/16/2017   History of Present Illness  Pt is an 83 year old male brought to the ED via EMS from [facility] and admitted for acute kidney injury.  Pt with significant PMH of DM 2, PVD, COPD, CHF, anemia, Alzheimer's dementia, spine surgery, multiple LE bypass grafts on the R, and colon CA s/p resection.  Clinical Impression  Pt admitted with above diagnosis. Pt currently with functional limitations due to the deficits listed below (see PT Problem List).  Pt will benefit from skilled PT to increase their independence and safety with mobility to allow discharge to the venue listed below.   Pt assisted to sitting EOB however had difficulty following commands and keeping eyes open once EOB.  Pt assisted back to bed and repositioned with assist from RN.  Recommend return to SNF upon d/c.     Follow Up Recommendations SNF;Supervision/Assistance - 24 hour    Equipment Recommendations  None recommended by PT(per next venue)    Recommendations for Other Services       Precautions / Restrictions Precautions Precautions: Fall Restrictions Weight Bearing Restrictions: No      Mobility  Bed Mobility Overal bed mobility: Needs Assistance Bed Mobility: Rolling;Sidelying to Sit;Sit to Sidelying Rolling: Mod assist Sidelying to sit: Max assist;HOB elevated     Sit to sidelying: Min assist General bed mobility comments: multimodal cues for technique, pt requiring assist to initiate tasks, assist for LEs over EOB and trunk upright, pt able to assist more with return to supine  Transfers                 General transfer comment: NT, pt falling asleep EOB and stopped following commands, reports being tired  Ambulation/Gait                Stairs            Wheelchair Mobility    Modified Rankin (Stroke Patients Only)       Balance Overall balance  assessment: Needs assistance Sitting-balance support: Feet supported;Bilateral upper extremity supported Sitting balance-Leahy Scale: Poor Sitting balance - Comments: requiring assist for trunk support                                     Pertinent Vitals/Pain Pain Assessment: No/denies pain Faces Pain Scale: No hurt    Home Living Family/patient expects to be discharged to:: Skilled nursing facility Living Arrangements: Spouse/significant other               Additional Comments: Per chart, pt was at home prior to last admission and then to [facility] for rehab upon d/c    Prior Function Level of Independence: Needs assistance   Gait / Transfers Assistance Needed: Pt poor historian     Comments: pt unable to provide accurate history due to cognitive deficits, no caregiver present in room     Hand Dominance        Extremity/Trunk Assessment   Upper Extremity Assessment Upper Extremity Assessment: Generalized weakness    Lower Extremity Assessment Lower Extremity Assessment: Generalized weakness       Communication   Communication: HOH  Cognition Arousal/Alertness: Awake/alert Behavior During Therapy: WFL for tasks assessed/performed Overall Cognitive Status: History of cognitive impairments - at baseline(only orientated to self)  General Comments: Pt with history of Alzheimer's dementia; very sleepy today however able to remain awake for session; able to folllow one-step commands however inconsistent; pt poor historian, no family/caregiver present to determine PLOF      General Comments      Exercises     Assessment/Plan    PT Assessment Patient needs continued PT services  PT Problem List Decreased strength;Decreased range of motion;Decreased activity tolerance;Decreased balance;Decreased mobility;Decreased coordination;Decreased cognition;Decreased safety awareness       PT Treatment  Interventions DME instruction;Gait training;Functional mobility training;Therapeutic activities;Therapeutic exercise;Balance training;Cognitive remediation;Patient/family education    PT Goals (Current goals can be found in the Care Plan section)  Acute Rehab PT Goals PT Goal Formulation: Patient unable to participate in goal setting Time For Goal Achievement: 10/30/17 Potential to Achieve Goals: Fair    Frequency Min 2X/week   Barriers to discharge        Co-evaluation               AM-PAC PT "6 Clicks" Daily Activity  Outcome Measure Difficulty turning over in bed (including adjusting bedclothes, sheets and blankets)?: Unable Difficulty moving from lying on back to sitting on the side of the bed? : Unable Difficulty sitting down on and standing up from a chair with arms (e.g., wheelchair, bedside commode, etc,.)?: Unable Help needed moving to and from a bed to chair (including a wheelchair)?: Total Help needed walking in hospital room?: Total Help needed climbing 3-5 steps with a railing? : Total 6 Click Score: 6    End of Session   Activity Tolerance: Patient limited by fatigue Patient left: with call bell/phone within reach;in bed;with bed alarm set Nurse Communication: Mobility status PT Visit Diagnosis: Muscle weakness (generalized) (M62.81);Adult, failure to thrive (R62.7)    Time: 2334-3568 PT Time Calculation (min) (ACUTE ONLY): 15 min   Charges:   PT Evaluation $PT Eval Low Complexity: 1 Low     PT G Codes:        Carmelia Bake, PT, DPT 10/16/2017 Pager: [phone]  York Ram E 10/16/2017, 12:23 PM

## 2017-10-16 NOTE — Progress Notes (Signed)
Initial Nutrition Assessment  DOCUMENTATION CODES:   Not applicable  INTERVENTION:    Monitor for SLP recommendations  Magic cup TID with meals, each supplement provides 290 kcal and 9 grams of protein  NUTRITION DIAGNOSIS:   Increased nutrient needs related to acute illness as evidenced by estimated needs.  GOAL:   Patient will meet greater than or equal to 90% of their needs  MONITOR:   PO intake, Supplement acceptance, Labs, Weight trends  REASON FOR ASSESSMENT:   Malnutrition Screening Tool    ASSESSMENT:   Patient with PMH significant for dementia, systolic CHF, COPD with chronic respiratory failure, HTN, PVD, and DM. Recently admitted 7/8 at [facility] for hematuria. Presents this admission with urinary retention with gross hematuria and AKI.    No family at bedside to provide nutrition information. During pt's visit on 7/5 wife reported pt had a very hearty appetite. Will attempt to speak with her if possible to find out how he was doing after discharge. RN reports "Pt coughing at intermittently when taken in oral liquids, meat at breakfast chopped due to coughing. MD made aware, speech therapy consult ordered." He consumed 50% of his breakfast this morning. Will monitor for SLP recommendations.   Weight shows to be stable from last admission 159 lb to 160 lb. Weight fluctuates likely related to his CHF history, but suspect pt has lost dry weight. Nutrition-Focused physical exam completed.   Medications reviewed.  Labs reviewed.   NUTRITION - FOCUSED PHYSICAL EXAM:    Most Recent Value  Orbital Region  No depletion  Upper Arm Region  Mild depletion  Thoracic and Lumbar Region  Unable to assess  Buccal Region  No depletion  Temple Region  Moderate depletion  Clavicle Bone Region  Moderate depletion  Clavicle and Acromion Bone Region  Mild depletion  Scapular Bone Region  Unable to assess  Dorsal Hand  Mild depletion  Patellar Region  Moderate depletion  Anterior  Thigh Region  Moderate depletion  Posterior Calf Region  Moderate depletion  Edema (RD Assessment)  Mild  Hair  Reviewed  Eyes  Reviewed  Mouth  Reviewed  Skin  Reviewed  Nails  Reviewed     Diet Order:   Diet Order           Diet heart healthy/carb modified Room service appropriate? Yes; Fluid consistency: Thin  Diet effective now          EDUCATION NEEDS:   Not appropriate for education at this time  Skin:  Skin Integrity Issues:: Stage I Stage I: right and left heel  Last BM:  10/16/17  Height:   Ht Readings from Last 1 Encounters:  10/15/17 6\' 1"  (1.854 m)    Weight:   Wt Readings from Last 1 Encounters:  10/15/17 160 lb (72.6 kg)    Ideal Body Weight:  83.6 kg  BMI:  Body mass index is 21.11 kg/m.  Estimated Nutritional Needs:   Kcal:  1900-2100 kcal  Protein:  85-95 grams   Fluid:  >1.9 L/day    Mariana Single RD, LDN Clinical Nutrition Pager # - [phone]

## 2017-10-17 ENCOUNTER — Inpatient Hospital Stay (HOSPITAL_COMMUNITY): Payer: PPO

## 2017-10-17 LAB — BASIC METABOLIC PANEL
ANION GAP: 9 (ref 5–15)
BUN: 16 mg/dL (ref 8–23)
CALCIUM: 8.8 mg/dL — AB (ref 8.9–10.3)
CO2: 23 mmol/L (ref 22–32)
Chloride: 106 mmol/L (ref 98–111)
Creatinine, Ser: 0.91 mg/dL (ref 0.61–1.24)
GFR calc Af Amer: >60 mL/min (ref >60)
GFR calc non Af Amer: >60 mL/min (ref >60)
GLUCOSE: 134 mg/dL — AB (ref 70–99)
Potassium: 4 mmol/L (ref 3.5–5.1)
Sodium: 138 mmol/L (ref 135–145)

## 2017-10-17 LAB — CBC
HEMATOCRIT: 34.8 % — AB (ref 39.0–52.0)
HEMOGLOBIN: 10.9 g/dL — AB (ref 13.0–17.0)
MCH: 28.1 pg (ref 26.0–34.0)
MCHC: 31.3 g/dL (ref 30.0–36.0)
MCV: 89.7 fL (ref 78.0–100.0)
Platelets: 379 10*3/uL (ref 150–400)
RBC: 3.88 MIL/uL — ABNORMAL LOW (ref 4.22–5.81)
RDW: 15.7 % — AB (ref 11.5–15.5)
WBC: 13.8 10*3/uL — ABNORMAL HIGH (ref 4.0–10.5)

## 2017-10-17 LAB — GLUCOSE, CAPILLARY
GLUCOSE-CAPILLARY: 120 mg/dL — AB (ref 70–99)
GLUCOSE-CAPILLARY: 119 mg/dL — AB (ref 70–99)
Glucose-Capillary: 103 mg/dL — ABNORMAL HIGH (ref 70–99)
Glucose-Capillary: 137 mg/dL — ABNORMAL HIGH (ref 70–99)
Glucose-Capillary: 160 mg/dL — ABNORMAL HIGH (ref 70–99)

## 2017-10-17 MED ORDER — FAMOTIDINE IN NACL 20-0.9 MG/50ML-% IV SOLN
20.0000 mg | INTRAVENOUS | Status: DC
  Administered 2017-10-17 – 2017-10-18 (×2): 20 mg via INTRAVENOUS
  Filled 2017-10-17 (×2): qty 50

## 2017-10-17 MED ORDER — HYDRALAZINE HCL 20 MG/ML IJ SOLN
10.0000 mg | Freq: Three times a day (TID) | INTRAMUSCULAR | Status: DC
  Administered 2017-10-17 – 2017-10-18 (×4): 10 mg via INTRAVENOUS
  Filled 2017-10-17 (×4): qty 1

## 2017-10-17 MED ORDER — METOPROLOL TARTRATE 5 MG/5ML IV SOLN
2.5000 mg | Freq: Four times a day (QID) | INTRAVENOUS | Status: DC
  Administered 2017-10-17 – 2017-10-18 (×5): 2.5 mg via INTRAVENOUS
  Filled 2017-10-17 (×6): qty 5

## 2017-10-17 NOTE — Progress Notes (Signed)
CMT called to report pt had 5 beats of wide QRS, no distress noted. Pt denies pain. VS 151/81 96% RA. MD made aware. SRP, RN

## 2017-10-17 NOTE — Consult Note (Signed)
   [facility] CM Inpatient Consult   10/17/2017  [name] 09-01-1934 [id number]   Mr. [name] is active with [facility]. He is followed by [facility] while at [facility].   Made inpatient LCSW and inpatient RNCM aware that Mr. Morozov will need a SNF that is accepting long term care beds.   Inpatient LCSW following for placement. Will continue to follow.   Marthenia Rolling, MSN-Ed, RN,BSN [facility] Liaison [phone]

## 2017-10-17 NOTE — Progress Notes (Signed)
Modified Barium Swallow Progress Note  Patient Details  Name: [name] MRN: [id number] Date of Birth: Apr 09, 1934  Today's Date: 10/17/2017  Modified Barium Swallow completed.  Full report located under Chart Review in the Imaging Section.  Brief recommendations include the following:  Clinical Impression  Pt with mild oropharyngeal and cervical esophageal dysphagia.  No aspiration noted and trace penetration of thin cleared with same swallow.  Pt did have oral holding of barium- worse with liquids that may allow premature spillage of liquids into pharynx with increased risk of aspiration.  Pharyngeal swallow is strong without residuals.  Mild residuals of liquids in pharynx is likely due to increased pressures due to suspected esophageal deficits.  Prominent cricopharyngeus noted which resulted in stasis with liquids with backflow to distal pharynx and pudding without awareness.  Barium tablet given with thin appeared to clear readily.  However pt did have retrograde propulsion of liquids from distal to midesophagus.  Recommend he follow strict esophageal precautions.  Pt may be at risk of developing Zenker's diverticulum in the future.  Will follow for education to mitigate dysphagia/aspiration.  Thanks for this consult.    Swallow Evaluation Recommendations       SLP Diet Recommendations: Dysphagia 3 (Mech soft) solids;Thin liquid   Liquid Administration via: Cup;Straw   Medication Administration: Whole meds with liquid   Supervision: Patient able to self feed   Compensations: Minimize environmental distractions;Slow rate;Small sips/bites;Other (Comment)(small frequent meals preferred)   Postural Changes: Remain semi-upright after after feeds/meals (Comment);Seated upright at 90 degrees   Oral Care Recommendations: Oral care BID        Macario Golds 10/17/2017,3:32 PM

## 2017-10-17 NOTE — Progress Notes (Signed)
PROGRESS NOTE    [name]  SWF:[id number] DOB: December 17, 1934 DOA: 10/15/2017 PCP: [place] Chard, MD   Brief Narrative: [name] is a 83 y.o. male with a history of systolic heart failure, dementia, diabetes mellitus, COPD with chronic respiratory failure, hypertension, peripheral vascular disease, chronic Foley use.  Patient presents secondary to acute urinary retention.  Concern for all.  Patient with gross hematuria.  Treating for urinary tract infection.  Associated acute kidney injury has improved.   Assessment & Plan:   Principal Problem:   AKI (acute kidney injury) ([place]) Active Problems:   PVD   COPD COPD II    Dementia   Chronic systolic CHF (congestive heart failure) (HCC)   Diabetes mellitus ([place])   Gross hematuria   Acute kidney injury In setting of acute obstruction.  Resolved with Foley replacement in addition to IV fluids.  Baseline of 0.7. On admission, creatinine of 2.09 improved to around baseline.  Acute urinary retention Likely secondary to urinary tract infection in addition to occluded Foley catheter.  Associated gross hematuria.  On Plavix as an outpatient.  PAD Patient is status post femoral-femoral bypass graft and stent placement.  Patient is on Plavix as an outpatient. -Continue to hold Plavix  Leukocytosis Improved. In setting of likely UTI.  UTI Urine culture pending; currently unidentified organism. On Ceftin as an outpatient for unknown infection -Continue Cefepime  Hyperkalemia In setting of AKI. Resolved.  COPD Chronic respiratory failure with hypoxia -Continue Breo ellipta  Chronic combined systolic and diastolic heart failure Last EF of 20-25% with diffuse hypokinesis and grade 2 diastolic dysfunction. -Continue metoprolol  Essential hypertension -Continue hydralazine and metoprolol  Diabetes mellitus On Januvia as an outpatient -SSI q4 hours while NPO -D5 1/2 NS while NPO  Hyperlipidemia -Continue  pravastatin   DVT prophylaxis: SCDs Code Status:   Code Status: DNR Family Communication: None at bedside Disposition Plan: Discharge back to SNF in 24 hours pending urine culture data   Consultants:   None  Procedures:   None  Antimicrobials:  Cefepime (7/23>>   Subjective: No pain today. No concerns.  Objective: Vitals:   10/16/17 1351 10/16/17 2033 10/16/17 2209 10/17/17 0402  BP: 138/63 140/73 (!) 147/62 (!) 166/79  Pulse: 70 67 80 76  Resp: 20 18  (!) 24  Temp: 97.8 F (36.6 C) 97.8 F (36.6 C)  98.6 F (37 C)  TempSrc: Oral Oral  Oral  SpO2: 97% 95% 100% 95%  Weight:      Height:        Intake/Output Summary (Last 24 hours) at 10/17/2017 0818 Last data filed at 10/17/2017 0708 Gross per 24 hour  Intake 2051.67 ml  Output 825 ml  Net 1226.67 ml   Filed Weights   10/15/17 1637  Weight: 72.6 kg (160 lb)    Examination:  General exam: Appears calm and comfortable Respiratory system: Clear to auscultation. Respiratory effort normal. Cardiovascular system: S1 & S2 heard, RRR. No murmurs, rubs, gallops or clicks. Gastrointestinal system: Abdomen is nondistended, soft and nontender.  Normal bowel sounds heard. Central nervous system: Alert and oriented to person. Extremities: No edema. No calf tenderness Skin: No cyanosis. No rashes Psychiatry: Judgement and insight appear impaired. Flat affect.    Data Reviewed: I have personally reviewed following labs and imaging studies  CBC: Recent Labs  Lab 10/15/17 1842 10/16/17 0455  WBC 26.7* 18.4*  NEUTROABS 23.8*  --   HGB 13.7 10.7*  HCT 42.2 33.9*  MCV 88.7 87.8  PLT 492* 093*   Basic Metabolic Panel: Recent Labs  Lab 10/15/17 1842 10/16/17 0455 10/17/17 0640  NA 141 141 138  K 5.8* 4.4 4.0  CL 104 109 106  CO2 23 25 23   GLUCOSE 209* 139* 134*  BUN 33* 24* 16  CREATININE 2.09* 1.03 0.91  CALCIUM 10.0 8.9 8.8*   GFR: Estimated Creatinine Clearance: 66.5 mL/min (by C-G formula  based on SCr of 0.91 mg/dL). Liver Function Tests: No results for input(s): AST, ALT, ALKPHOS, BILITOT, PROT, ALBUMIN in the last 168 hours. No results for input(s): LIPASE, AMYLASE in the last 168 hours. No results for input(s): AMMONIA in the last 168 hours. Coagulation Profile: No results for input(s): INR, PROTIME in the last 168 hours. Cardiac Enzymes: No results for input(s): CKTOTAL, CKMB, CKMBINDEX, TROPONINI in the last 168 hours. BNP (last 3 results) No results for input(s): PROBNP in the last 8760 hours. HbA1C: No results for input(s): HGBA1C in the last 72 hours. CBG: Recent Labs  Lab 10/16/17 1742 10/16/17 2031 10/16/17 2341 10/17/17 0358 10/17/17 0727  GLUCAP 98 120* 107* 120* 103*   Lipid Profile: No results for input(s): CHOL, HDL, LDLCALC, TRIG, CHOLHDL, LDLDIRECT in the last 72 hours. Thyroid Function Tests: No results for input(s): TSH, T4TOTAL, FREET4, T3FREE, THYROIDAB in the last 72 hours. Anemia Panel: No results for input(s): VITAMINB12, FOLATE, FERRITIN, TIBC, IRON, RETICCTPCT in the last 72 hours. Sepsis Labs: No results for input(s): PROCALCITON, LATICACIDVEN in the last 168 hours.  Recent Results (from the past 240 hour(s))  Urine culture     Status: Abnormal (Preliminary result)   Collection Time: 10/15/17  6:14 PM  Result Value Ref Range Status   Specimen Description   Final    URINE, CLEAN CATCH Performed at [facility], [place] [address], [place], [place] [address]    Special Requests   Final    NONE Performed at [facility], [place] [address], [place], [place] [address]    Culture (A)  Final    >=100,000 COLONIES/mL UNIDENTIFIED ORGANISM Performed at [facility], [place] [address], [place], [place] [address]    Report Status PENDING  Incomplete  MRSA PCR Screening     Status: None   Collection Time: 10/16/17 12:03 AM  Result Value Ref Range Status   MRSA by PCR NEGATIVE NEGATIVE Final     Comment:        The GeneXpert MRSA Assay (FDA approved for NASAL specimens only), is one component of a comprehensive MRSA colonization surveillance program. It is not intended to diagnose MRSA infection nor to guide or monitor treatment for MRSA infections. Performed at [facility], [place] [address] [place], [place] [address]          Radiology Studies: Ct Abdomen Pelvis Wo Contrast  Result Date: 10/15/2017 CLINICAL DATA:  Hematuria decreased urine output EXAM: CT ABDOMEN AND PELVIS WITHOUT CONTRAST TECHNIQUE: Multidetector CT imaging of the abdomen and pelvis was performed following the standard protocol without IV contrast. COMPARISON:  09/24/2017 FINDINGS: Lower chest: Chronic changes are noted in the bases bilaterally stable from the prior exam. Hepatobiliary: No focal liver abnormality is seen. No gallstones, gallbladder wall thickening, or biliary dilatation. Pancreas: Unremarkable. No pancreatic ductal dilatation or surrounding inflammatory changes. Spleen: Normal in size without focal abnormality. Adrenals/Urinary Tract: Adrenal glands are within normal limits. The kidneys are well visualized bilaterally without renal calculi or urinary tract obstructive changes. Hypodensity is again identified from the inferior  aspect of the left kidney likely representing small cyst. The bladder is decompressed by Foley catheter. Considerable amount of air is noted within the bladder as well. The changes suggestive of bladder rupture adjacent to the dome of the bladder seen on the prior exam are no longer identified. Stomach/Bowel: Stomach is within normal limits. Appendix appears normal. No evidence of bowel wall thickening, distention, or inflammatory changes. Postsurgical changes in the distal colon are noted. Vascular/Lymphatic: Aortic atherosclerosis. No enlarged abdominal or pelvic lymph nodes. Reproductive: Prostate is unremarkable. Other: No abdominal wall hernia or  abnormality. No abdominopelvic ascites. Musculoskeletal: Postsurgical changes at L4-5 are noted. IMPRESSION: Previously seen changes suggestive of bladder rupture have resolved in the interval from the prior exam. Bladder is decompressed by Foley catheter. No new focal abnormality is noted. Electronically Signed   By: Inez Catalina M.D.   On: 10/15/2017 21:39   Dg Chest 2 View  Result Date: 10/15/2017 CLINICAL DATA:  Cough EXAM: CHEST - 2 VIEW COMPARISON:  09/21/2017 FINDINGS: Fine reticular opacities at the right base correlates with pulmonary microcalcifications on abdominal CT 09/24/2017. No edema or consolidation. No effusion or pneumothorax. Normal heart size and mediastinal contours. Atherosclerotic calcification. IMPRESSION: No acute finding when compared to priors. Electronically Signed   By: Monte Fantasia M.D.   On: 10/15/2017 19:12   Dg Abd 2 Views  Result Date: 10/15/2017 CLINICAL DATA:  Ileus.  Decreased urine output EXAM: ABDOMEN - 2 VIEW COMPARISON:  09/28/2017 FINDINGS: Normal bowel gas pattern. No excessive stool retention. Interspinous spacer at L4-5. Clips and bowel sutures in the pelvis with right iliac stenting. History of Foley catheter with none visualized on this study. The visualized lung bases are clear. IMPRESSION: Normal bowel gas pattern. Electronically Signed   By: Monte Fantasia M.D.   On: 10/15/2017 19:09        Scheduled Meds: . dextromethorphan-guaiFENesin  1 tablet Oral BID  . famotidine  20 mg Oral Daily  . fluticasone furoate-vilanterol  1 puff Inhalation Daily  . hydrALAZINE  10 mg Oral TID  . insulin aspart  0-9 Units Subcutaneous Q4H  . linagliptin  5 mg Oral Daily  . mouth rinse  15 mL Mouth Rinse BID  . metoprolol tartrate  25 mg Oral BID  . pravastatin  80 mg Oral q1800   Continuous Infusions: . ceFEPime (MAXIPIME) IV Stopped (10/17/17 8756)  . dextrose 5 % and 0.45% NaCl 75 mL/hr at 10/17/17 0708     LOS: 2 days     Cordelia Poche,  MD [facility] 10/17/2017, 8:18 AM Pager: [phone]  If 7PM-7AM, please contact night-coverage [email or web address] 10/17/2017, 8:18 AM

## 2017-10-18 DIAGNOSIS — J449 Chronic obstructive pulmonary disease, unspecified: Secondary | ICD-10-CM

## 2017-10-18 DIAGNOSIS — E118 Type 2 diabetes mellitus with unspecified complications: Secondary | ICD-10-CM

## 2017-10-18 LAB — GLUCOSE, CAPILLARY
GLUCOSE-CAPILLARY: 124 mg/dL — AB (ref 70–99)
GLUCOSE-CAPILLARY: 116 mg/dL — AB (ref 70–99)
Glucose-Capillary: 123 mg/dL — ABNORMAL HIGH (ref 70–99)
Glucose-Capillary: 140 mg/dL — ABNORMAL HIGH (ref 70–99)
Glucose-Capillary: 96 mg/dL (ref 70–99)

## 2017-10-18 LAB — URINE CULTURE

## 2017-10-18 MED ORDER — AMOXICILLIN 500 MG PO CAPS: 500.0000 mg | ORAL_CAPSULE | Freq: Three times a day (TID) | ORAL | Status: AC

## 2017-10-18 MED ORDER — AMOXICILLIN 250 MG PO CAPS
500.0000 mg | ORAL_CAPSULE | Freq: Three times a day (TID) | ORAL | Status: DC
  Administered 2017-10-18: 500 mg via ORAL
  Filled 2017-10-18: qty 2

## 2017-10-18 NOTE — Care Management Important Message (Signed)
Important Message  Patient Details  Name: [name] MRN: [id number] Date of Birth: 1934-09-30   Medicare Important Message Given:  Yes    Kerin Salen 10/18/2017, 10:44 AMImportant Message  Patient Details  Name: [name] MRN: [id number] Date of Birth: 05-14-1934   Medicare Important Message Given:  Yes    Kerin Salen 10/18/2017, 10:44 AM

## 2017-10-18 NOTE — Clinical Social Work Placement (Signed)
Patient received and accepted bed at [facility] SNF. [facility] accepted patient for [place] [facility] term care Medicaid pending [facility] Worker Launiupoko Paylor [phone])  # [id number] P). Facility aware of patient's discharge and confirmed bed offer. PTAR contacted, patient's wife notified. Patient's RN can call report to [phone], packet complete. CSW signing off, no other needs identified at this time.  CLINICAL SOCIAL WORK PLACEMENT  NOTE  Date:  10/18/2017  Patient Details  Name: [name] MRN: [id number] Date of Birth: 1934/08/08  Clinical Social Work is seeking post-discharge placement for this patient at the [place] level of care (*CSW will initial, date and re-position this form in  chart as items are completed):  Yes   Patient/family provided with [facility] Work Department's list of facilities offering this level of care within the geographic area requested by the patient (or if unable, by the patient's family).  Yes   Patient/family informed of their freedom to choose among providers that offer the needed level of care, that participate in Medicare, Medicaid or managed care program needed by the patient, have an available bed and are willing to accept the patient.  Yes   Patient/family informed of [facility]'s ownership interest in [facility] and [facility], as well as of the fact that they are under no obligation to receive care at these facilities.  PASRR submitted to EDS on       PASRR number received on       Existing PASRR number confirmed on 10/16/17     FL2 transmitted to all facilities in geographic area requested by pt/family on 10/16/17     FL2 transmitted to all facilities within larger geographic area on       Patient informed that his/her managed care company has contracts with or will negotiate with certain facilities, including the following:        Yes   Patient/family informed of bed  offers received.  Patient chooses bed at [facility]     Physician recommends and patient chooses bed at      Patient to be transferred to [facility] on 10/18/17.  Patient to be transferred to facility by PTAR     Patient family notified on 10/18/17 of transfer.  Name of family member notified:  [facility]     PHYSICIAN       Additional Comment:    _______________________________________________ Burnis Medin, LCSW 10/18/2017, 3:41 PM

## 2017-10-18 NOTE — Discharge Instructions (Signed)
Catheter-Associated Urinary Tract Infection FAQs  What is "catheter-associated urinary tract infection"?  A urinary tract infection (also called "UTI") is an infection in the urinary system, which includes the bladder (which stores the urine) and the kidneys (which filter the blood to make urine). Germs (for example, bacteria or yeasts) do not normally live in these areas; but if germs are introduced, an infection can occur.  If you have a urinary catheter, germs can travel along the catheter and cause an infection in your bladder or your kidney; in that case it is called a catheter-associated urinary tract infection (or "CA-UTI").  What is a urinary catheter?  A urinary catheter is a thin tube placed in the bladder to drain urine. Urine drains through the tube into a bag that collects the urine. A urinary catheter may be used:   If you are not able to urinate on your own   To measure the amount of urine that you make, for example, during intensive care   During and after some types of surgery   During some tests of the kidneys and bladder    People with urinary catheters have a much higher chance of getting a urinary tract infection than people who don't have a catheter.  How do I get a catheter-associated urinary tract infection (CA-UTI)?  If germs enter the urinary tract, they may cause an infection. Many of the germs that cause a catheter-associated urinary tract infection are common germs found in your intestines that do not usually cause an infection there. Germs can enter the urinary tract when the catheter is being put in or while the catheter remains in the bladder.  What are the symptoms of a urinary tract infection?  Some of the common symptoms of a urinary tract infection are:   Burning or pain in the lower abdomen (that is, below the stomach)   Fever   Bloody urine may be a sign of infection, but is also caused by other problems   Burning during urination or an increase in the frequency of  urination after the catheter is removed.    Sometimes people with catheter-associated urinary tract infections do not have these symptoms of infection.  Can catheter-associated urinary tract infections be treated?  Yes, most catheter-associated urinary tract infections can be treated with antibiotics and removal or change of the catheter. Your doctor will determine which antibiotic is best for you.  What are some of the things that hospitals are doing to prevent catheter-associated urinary tract infections?  To prevent urinary tract infections, doctors and nurses take the following actions.  Catheter insertion   Catheters are put in only when necessary and they are removed as soon as possible.   Only properly trained persons insert catheters using sterile ("clean") technique.   The skin in the area where the catheter will be inserted is cleaned before inserting the catheter.   Other methods to drain the urine are sometimes used, such as:  ? External catheters in men (these look like condoms and are placed over the penis rather than into the penis)  ? Putting a temporary catheter in to drain the urine and removing it right away. This is called intermittent urethral catheterization.    Catheter care   Healthcare providers clean their hands by washing them with soap and water or using an alcohol-based hand rub before and after touching your catheter.   If you do not see your providers clean their hands, please ask them to do so.     Avoid disconnecting the catheter and drain tube. This helps to prevent germs from getting into the catheter tube.   The catheter is secured to the leg to prevent pulling on the catheter.   Avoid twisting or kinking the catheter.   Keep the bag lower than the bladder to prevent urine from backflowing to the bladder.   Empty the bag regularly. The drainage spout should not touch anything while emptying the bag.    What can I do to help prevent catheter-associated urinary tract  infections if I have a catheter?   Always clean your hands before and after doing catheter care.   Always keep your urine bag below the level of your bladder.   Do not tug or pull on the tubing.   Do not twist or kink the catheter tubing.   Ask your healthcare provider each day if you still need the catheter.    What do I need to do when I go home from the hospital?   If you will be going home with a catheter, your doctor or nurse should explain everything you need to know about taking care of the catheter. Make sure you understand how to care for it before you leave the hospital.   If you develop any of the symptoms of a urinary tract infection, such as burning or pain in the lower abdomen, fever, or an increase in the frequency of urination, contact your doctor or nurse immediately.   Before you go home, make sure you know who to contact if you have questions or problems after you get home.  If you have questions, please ask your doctor or nurse.  Developed and co-sponsored by The Society for Healthcare Epidemiology of America (SHEA); Infectious Diseases Society of America (IDSA); American Hospital Association; Association for Professionals in Infection Control and Epidemiology (APIC); Centers for Disease Control and Prevention (CDC); and The Joint Commission.  This information is not intended to replace advice given to you by your health care provider. Make sure you discuss any questions you have with your health care provider.  Document Released: 12/05/2011 Document Revised: 08/24/2015 Document Reviewed: 05/26/2014  [employer]vier Interactive Patient Education  2018 [employer]

## 2017-10-18 NOTE — Progress Notes (Signed)
Physical Therapy Treatment Patient Details Name: [name] MRN: [id number] DOB: 07-Apr-1934 Today's Date: 10/18/2017    History of Present Illness Pt is an 83 year old male brought to the ED via EMS from [facility] and admitted for acute kidney injury.  Pt with significant PMH of DM 2, PVD, COPD, CHF, anemia, Alzheimer's dementia, spine surgery, multiple LE bypass grafts on the R, and colon CA s/p resection.    PT Comments    Pt's spouse present today.  She reports pt was ambulatory in home without assistive device and used RW for community prior to last admission.  She also states she was told pt was making some progress with therapy at SNF upon d/c from previous admission however has not seen him ambulating since previous admission.  Pt more awake and alert today, following commands with time and multimodal cues.  Pt able to perform a couple sit to stands and march in place in preparation for ambulating.  Pt also performed UE and LE exercises sitting EOB.  Recommend return to SNF for continued rehab as spouse would like pt to be ambulatory again.   Follow Up Recommendations  SNF;Supervision/Assistance - 24 hour     Equipment Recommendations  None recommended by PT(per next venue)    Recommendations for Other Services       Precautions / Restrictions Precautions Precautions: Fall    Mobility  Bed Mobility Overal bed mobility: Needs Assistance Bed Mobility: Supine to Sit;Sit to Supine     Supine to sit: Mod assist;HOB elevated Sit to supine: Mod assist;HOB elevated   General bed mobility comments: multimodal cues for technique, requiring assist for LEs over EOB and provided hands for pt to self assist trunk upright, pt required assist for LE onto bed likely due to fatigue  Transfers Overall transfer level: Needs assistance Equipment used: Rolling walker (2 wheeled) Transfers: Sit to/from Stand Sit to Stand: Mod assist;Min assist         General transfer  comment: multimodal cues for technique, pt attempting to follow cues and assist as able, pt performed sit to stand twice for strengthening, also able to march in place approx 30 secs x2  Ambulation/Gait             General Gait Details: deferred today for safety, pt has not ambulated in a while per spouse and would need recliner following for safety   Stairs             Wheelchair Mobility    Modified Rankin (Stroke Patients Only)       Balance Overall balance assessment: Needs assistance Sitting-balance support: No upper extremity supported;Feet supported Sitting balance-Leahy Scale: Fair     Standing balance support: Bilateral upper extremity supported Standing balance-Leahy Scale: Poor                              Cognition Arousal/Alertness: Awake/alert Behavior During Therapy: WFL for tasks assessed/performed Overall Cognitive Status: History of cognitive impairments - at baseline                                 General Comments: pt more awake/alert for session today, able to follow commands with time      Exercises General Exercises - Upper Extremity Shoulder Flexion: Both;10 reps;AROM;Limitations Shoulder Flexion Limitations: limited shoulder flexion to approx 90* Elbow Flexion: Both;10 reps;AROM Elbow  Extension: AROM;Both;10 reps General Exercises - Lower Extremity Ankle Circles/Pumps: Both;10 reps;Seated;AROM Long Arc Quad: AROM;Both;10 reps;Seated Hip Flexion/Marching: Both;AROM;10 reps;Seated    General Comments        Pertinent Vitals/Pain Pain Assessment: No/denies pain Faces Pain Scale: No hurt    Home Living                      Prior Function            PT Goals (current goals can now be found in the care plan section) Progress towards PT goals: Progressing toward goals    Frequency    Min 2X/week      PT Plan Current plan remains appropriate    Co-evaluation               AM-PAC PT "6 Clicks" Daily Activity  Outcome Measure  Difficulty turning over in bed (including adjusting bedclothes, sheets and blankets)?: A Lot Difficulty moving from lying on back to sitting on the side of the bed? : A Lot Difficulty sitting down on and standing up from a chair with arms (e.g., wheelchair, bedside commode, etc,.)?: Unable Help needed moving to and from a bed to chair (including a wheelchair)?: A Little Help needed walking in hospital room?: A Lot Help needed climbing 3-5 steps with a railing? : Total 6 Click Score: 11    End of Session Equipment Utilized During Treatment: Gait belt Activity Tolerance: Patient limited by fatigue Patient left: with call bell/phone within reach;in bed;with bed alarm set;with family/visitor present Nurse Communication: Mobility status PT Visit Diagnosis: Muscle weakness (generalized) (M62.81)     Time: 5465-0354 PT Time Calculation (min) (ACUTE ONLY): 27 min  Charges:  $Therapeutic Exercise: 8-22 mins $Therapeutic Activity: 8-22 mins                    Carmelia Bake, PT, DPT 10/18/2017 Pager: [phone]  York Ram E 10/18/2017, 11:46 AM

## 2017-10-18 NOTE — Discharge Summary (Signed)
Physician Discharge Summary  [name] NFA:[id number] DOB: [birth year] DOA: 10/15/2017  PCP: Glendale Chard, MD  Admit date: 10/15/2017 Discharge date: 10/18/2017  Admitted From: SNF Disposition: SNF  Recommendations for Outpatient Follow-up:  1. Follow up with PCP in 1 week 2. Please obtain BMP/CBC in one week 3. If recurrent hematuria, please consider holding Plavix 4. Please follow up on the following pending results: None  Home Health: None Equipment/Devices: None  Discharge Condition: Stable CODE STATUS: DNR Diet recommendation: Heart healthy   Brief/Interim Summary:  Admission HPI written by Bethena Roys, MD   HPI: [name] is a 83 y.o. male with medical history significant for dementia, systolic CHF- 06/6960 Echo-EF 13 to 25%, G2DD, COPD with chronic respiratory failure, HTN, PVD, DM, who was brought to the ED via EMS from [facility] and rehab with reports of reduced urine output yesterday with blood, and today no urine output via chronic indwelling Foley.  Spouse is at bedside.  History is obtained mostly from the spouse and chart review, and nursing home notes, as patient- has significant dementia and cannot give me history.  Patient also complained of lower abdominal pain, and refused po intake today.  Patient spouse sees him daily she endorses a wet cough she noticed today, otherwise denies difficulty breathing reports of fever or chills at nursing home. Recent hospital admission 6/27- 10/02/17-initially for gross hematuria, with perforated urinary bladder and development of an ileus during admission.  Patient was discharged to SNF with Foley catheter and antibiotics cefuroxime to follow-up with urology outpatient.   ED Course: Elevated blood pressure 166/89 otherwise unremarkable vitals.  Leukocytosis- 26.  Creatinine elevated 2.03, elevated potassium 5.8.  Two-view chest and abdominal x-ray negative for acute abnormality. Urology- Dr. Fonda Kinder, consulted  in ED-recommended changing Foley catheter.  582ml of cloudy bloody urine drained . 1 L bolus normal saline given in ED IV cefepime started.  Hospitalist called to admit for AKI and probable UTI.    Hospital course:  Acute kidney injury In setting of acute obstruction.  Resolved with Foley replacement in addition to IV fluids.  Baseline of 0.7. On admission, creatinine of 2.09 improved to around baseline.  Acute urinary retention Likely secondary to urinary tract infection in addition to occluded Foley catheter.  Associated gross hematuria.  On Plavix as an outpatient. Held on admission. Restart on discharge. If recurrent hematuria, recommend holding Plavix  History of perforated bladder This was a problem.  This hospitalization.  Patient was seen by urology and Foley was placed.  Plan was to have urology follow-up as an outpatient.  Patient will need to follow-up with urology as soon as possible for reevaluation including cystoscopy.  PAD Patient is status post femoral-femoral bypass graft and stent placement.  Patient is on Plavix as an outpatient. Continued to hold Plavix.  Leukocytosis Improved. In setting of likely UTI.  UTI On Ceftin as an outpatient for unknown infection. Started on cefepime empirically. Urine culture significant for Enterococcus faecalis sensitive to ampicillin.  Patient was transitioned to amoxicillin to finish a 10-day course.  Hyperkalemia In setting of AKI. Resolved.  COPD Chronic respiratory failure with hypoxia Continued Breo ellipta  Chronic combined systolic and diastolic heart failure Last EF of 20-25% with diffuse hypokinesis and grade 2 diastolic dysfunction. Continued metoprolol  Essential hypertension Continued hydralazine and metoprolol  Diabetes mellitus On Januvia as an outpatient.  There is concern for aspiration.  Patient had a modified barium swallow which went well.  Patient was recommended to have regular diet.  Continue  Janumet as an outpatient.  Hyperlipidemia Continued pravastatin   Discharge Diagnoses:  Principal Problem:   AKI (acute kidney injury) ([place]) Active Problems:   PVD   COPD COPD II    Dementia   Chronic systolic CHF (congestive heart failure) ([facility])   Diabetes mellitus ([place])   Gross hematuria    Discharge Instructions  Discharge Instructions    Diet - low sodium heart healthy   Complete by:  As directed    Increase activity slowly   Complete by:  As directed      Allergies as of 10/18/2017   No Known Allergies     Medication List    STOP taking these medications   cefUROXime 500 MG tablet Commonly known as:  CEFTIN   dextromethorphan-guaiFENesin 30-600 MG 12hr tablet Commonly known as:  Casey DM     TAKE these medications   acetaminophen 500 MG tablet Commonly known as:  TYLENOL Take 500 mg by mouth every 6 (six) hours as needed for mild pain.   albuterol (2.5 MG/3ML) 0.083% nebulizer solution Commonly known as:  PROVENTIL Take 2.5 mg by nebulization every 6 (six) hours as needed for wheezing or shortness of breath.   amoxicillin 500 MG capsule Commonly known as:  AMOXIL Take 1 capsule (500 mg total) by mouth every 8 (eight) hours for 7 days. End date: 10/25/17   bisacodyl 10 MG suppository Commonly known as:  DULCOLAX Place 10 mg rectally daily as needed for moderate constipation (If Milk of Magnesia doesn't work).   BREO ELLIPTA 100-25 MCG/INH Aepb Generic drug:  fluticasone furoate-vilanterol TAKE 1 PUFF BY MOUTH EVERY DAY   clopidogrel 75 MG tablet Commonly known as:  PLAVIX TAKE 1 TABLET BY MOUTH DAILY. What changed:    how much to take  how to take this  when to take this   co-enzyme Q-10 30 MG capsule Take 30 mg by mouth at bedtime.   famotidine 20 MG tablet Commonly known as:  PEPCID One at bedtime What changed:    how much to take  how to take this  when to take this  additional instructions   furosemide 20 MG  tablet Commonly known as:  LASIX Take 1 tablet (20 mg total) by mouth every Monday, Wednesday, and Friday.   GLUCERNA Liqd Take 237 mLs by mouth 3 (three) times daily with meals. Chocolate   hydrALAZINE 10 MG tablet Commonly known as:  APRESOLINE Take 1 tablet (10 mg total) by mouth 3 (three) times daily.   magnesium hydroxide 400 MG/5ML suspension Commonly known as:  MILK OF MAGNESIA Take 30 mLs by mouth daily as needed for mild constipation.   metoprolol tartrate 25 MG tablet Commonly known as:  LOPRESSOR Take 1 tablet (25 mg total) by mouth 2 (two) times daily.   multivitamin-iron-minerals-folic acid chewable tablet Chew 1 tablet by mouth at bedtime.   omega-3 acid ethyl esters 1 g capsule Commonly known as:  LOVAZA Take by mouth at bedtime.   ondansetron 4 MG tablet Commonly known as:  ZOFRAN Take 1 tablet (4 mg total) by mouth every 6 (six) hours as needed for nausea.   pravastatin 80 MG tablet Commonly known as:  PRAVACHOL Take 80 mg by mouth daily.   RA SALINE ENEMA 19-7 GM/118ML Enem Place 1 application rectally daily as needed (If Milk of Magnesia and Bisacodyl don't work).   sitaGLIPtin 100 MG tablet Commonly known as:  JANUVIA Take 100  mg by mouth daily.      Follow-up Information    Kathie Rhodes, MD. Schedule an appointment as soon as possible for a visit.   Specialty:  Urology Why:  Follow-up for bladder perforation Contact information: [address] [place] [address] [phone]          No Known Allergies  Consultations:  None   Procedures/Studies: Ct Abdomen Pelvis Wo Contrast  Result Date: 10/15/2017 CLINICAL DATA:  Hematuria decreased urine output EXAM: CT ABDOMEN AND PELVIS WITHOUT CONTRAST TECHNIQUE: Multidetector CT imaging of the abdomen and pelvis was performed following the standard protocol without IV contrast. COMPARISON:  09/24/2017 FINDINGS: Lower chest: Chronic changes are noted in the bases bilaterally stable from  the prior exam. Hepatobiliary: No focal liver abnormality is seen. No gallstones, gallbladder wall thickening, or biliary dilatation. Pancreas: Unremarkable. No pancreatic ductal dilatation or surrounding inflammatory changes. Spleen: Normal in size without focal abnormality. Adrenals/Urinary Tract: Adrenal glands are within normal limits. The kidneys are well visualized bilaterally without renal calculi or urinary tract obstructive changes. Hypodensity is again identified from the inferior aspect of the left kidney likely representing small cyst. The bladder is decompressed by Foley catheter. Considerable amount of air is noted within the bladder as well. The changes suggestive of bladder rupture adjacent to the dome of the bladder seen on the prior exam are no longer identified. Stomach/Bowel: Stomach is within normal limits. Appendix appears normal. No evidence of bowel wall thickening, distention, or inflammatory changes. Postsurgical changes in the distal colon are noted. Vascular/Lymphatic: Aortic atherosclerosis. No enlarged abdominal or pelvic lymph nodes. Reproductive: Prostate is unremarkable. Other: No abdominal wall hernia or abnormality. No abdominopelvic ascites. Musculoskeletal: Postsurgical changes at L4-5 are noted. IMPRESSION: Previously seen changes suggestive of bladder rupture have resolved in the interval from the prior exam. Bladder is decompressed by Foley catheter. No new focal abnormality is noted. Electronically Signed   By: Inez Catalina M.D.   On: 10/15/2017 21:39   Ct Abdomen Pelvis Wo Contrast  Result Date: 09/24/2017 CLINICAL DATA:  83 year old male with history of potential bowel obstruction. EXAM: CT ABDOMEN AND PELVIS WITHOUT CONTRAST TECHNIQUE: Multidetector CT imaging of the abdomen and pelvis was performed following the standard protocol without IV contrast. COMPARISON:  CT of the abdomen and pelvis 09/19/2017. FINDINGS: Lower chest: Emphysematous changes. Small right pleural  effusion lying dependently. Atherosclerotic calcifications in the descending thoracic aorta as well as the left anterior descending and right coronary arteries. Nasogastric tube in the esophagus. Small hiatal hernia. Hepatobiliary: No definite cystic or solid hepatic lesions are confidently identified on today's noncontrast CT examination. Unenhanced appearance of the gallbladder is unremarkable. Pancreas: No definite pancreatic mass or peripancreatic fluid collections or inflammatory changes are noted on today's noncontrast CT examination. Spleen: Unremarkable. Adrenals/Urinary Tract: Unenhanced appearance of the adrenal glands and right kidney is unremarkable. 2 cm exophytic low-attenuation lesion extending off the medial aspect of the lower pole of the left kidney, incompletely characterized on today's noncontrast CT examination, but similar to prior studies, likely a tiny cyst. No hydroureteronephrosis. Urinary bladder is nearly decompressed around an indwelling Foley balloon catheter. There appears to be combination of intramural gas in the wall of the urinary bladder, as well as some extraluminal gas adjacent to the urinary bladder both anteriorly (in the space of Retzius) and superiorly. This is best appreciated on axial images 62-70 and sagittal images 54-66. Inflammatory changes are also noted throughout the adjacent soft tissues where there is also small volume  of intermediate attenuation fluid, which appears to be predominantly located in the space of Retzius. Stomach/Bowel: Nasogastric tube extends into the stomach. There multiple prominent borderline dilated loops of small bowel with multiple air-fluid levels. No pathologic dilatation of colon. Gas and stool is noted throughout the colon extending to the level of the rectum. Suture line in the rectum indicative of prior partial proctocolectomy. Normal appendix. Vascular/Lymphatic: Aortic atherosclerosis. Fem-fem bypass graft incompletely evaluated on  today's noncontrast CT examination. No lymphadenopathy noted in the abdomen or pelvis. Reproductive: Prostate gland and seminal vesicles are unremarkable in appearance. Other: Small volume of ascites. Trace volume of pneumoperitoneum adjacent to the urinary bladder, as discussed above. Musculoskeletal: Posterior orthopedic fixation hardware in place at L4-L5. Interbody graft at L4-L5. 13 mm of anterolisthesis of L4 upon L5. There are no aggressive appearing lytic or blastic lesions noted in the visualized portions of the skeleton. IMPRESSION: 1. Findings are highly concerning for perforated urinary bladder. This includes both intramural gas, extraluminal gas adjacent to the urinary bladder, and gas and fluid in the space of Retzius. 2. Small volume of ascites or urine noted in the peritoneal cavity. 3. Multiple prominent borderline dilated loops of small bowel with multiple air-fluid levels. Findings are not compatible with obstruction, but may reflect a mild ileus. 4. Aortic atherosclerosis, in addition to at least 2 vessel coronary artery disease. 5. Small right pleural effusion lying dependently. 6. Emphysema. 7. Additional incidental findings, as above. These results will be called to the ordering clinician or representative by the Radiologist Assistant, and communication documented in the PACS or zVision Dashboard. Aortic Atherosclerosis (ICD10-I70.0) and Emphysema (ICD10-J43.9). Electronically Signed   By: Vinnie Langton M.D.   On: 09/24/2017 12:50   Dg Chest 2 View  Result Date: 10/15/2017 CLINICAL DATA:  Cough EXAM: CHEST - 2 VIEW COMPARISON:  09/21/2017 FINDINGS: Fine reticular opacities at the right base correlates with pulmonary microcalcifications on abdominal CT 09/24/2017. No edema or consolidation. No effusion or pneumothorax. Normal heart size and mediastinal contours. Atherosclerotic calcification. IMPRESSION: No acute finding when compared to priors. Electronically Signed   By: Monte Fantasia M.D.   On: 10/15/2017 19:12   Ct Pelvis Wo Contrast  Result Date: 09/24/2017 CLINICAL DATA:  Suspected perforation of the urinary bladder on earlier noncontrast CT. Recent gross hematuria. History of diabetes, peripheral vascular disease and dementia. EXAM: CT PELVIS WITHOUT CONTRAST TECHNIQUE: Multidetector CT imaging of the pelvis was performed following the installation of 50 cc Omnipaque 300 diluted in 450 cc of saline through the indwelling Foley catheter. No intravenous contrast was administered. COMPARISON:  Noncontrast CT 09/19/2017 and 09/24/2017. FINDINGS: Urinary Tract: There is moderate anterior extravasation of contrast from the bladder into the prevesical fat. Contrast extends along the anterior abdominal wall, asymmetric to the left, admixed with extraluminal air. Contrast also extends into the prostate gland, asymmetric to the right. No contrast extension into the peritoneal cavity is seen. A Foley catheter is in place. There is mild bladder wall thickening and trabeculation. The distal ureters do not appear dilated. Bowel: Multiple borderline dilated loops of small bowel with air-fluid levels are again noted, most consistent with an ileus. There is a rectal anastomosis. The visualized colon and appendix otherwise appear normal. Vascular/Lymphatic: No enlarged pelvic lymph nodes identified. Extensive aortic and branch vessel atherosclerosis. Postsurgical changes in both groins from previous femoral-femoral bypass graft. Reproductive: Extravasated bladder contrast extends into the substance of the prostate gland, asymmetric to the right. The seminal vesicles appear normal. Other:  No free air or peritoneal contrast. Musculoskeletal: No evidence of pelvic fracture. Postsurgical changes in the lower lumbar spine and sacroiliac degenerative changes are noted. IMPRESSION: 1. CT cystogram confirms the presence of extraperitoneal bladder rupture with extravasation of contrast into the prevesical fat  and prostate gland. No peritoneal contrast demonstrated. 2. No evidence of underlying pelvic fracture. 3. Probable small bowel ileus. 4.  Aortic Atherosclerosis (ICD10-I70.0). Electronically Signed   By: Richardean Sale M.D.   On: 09/24/2017 16:49   Dg Chest Port 1 View  Result Date: 09/21/2017 CLINICAL DATA:  Dyspnea. EXAM: PORTABLE CHEST 1 VIEW COMPARISON:  05/04/2017 and prior studies. FINDINGS: Cardiomediastinal silhouette is normal. Mediastinal contours appear intact. Calcific atherosclerotic disease of the aorta. There is no evidence of pleural effusion or pneumothorax. Low lung volumes. Multiple tiny hyperdense nodules overlie bilateral lung bases, right greater than left. Osseous structures are without acute abnormality. Soft tissues are grossly normal. IMPRESSION: Multiple tiny hyperdense nodules overlie bilateral lung bases, right greater than left. These may represent pleural calcifications, pleuroparenchymal scarring or high density nodules along the bronchovascular tree of the lower lobes. Calcific atherosclerotic disease and tortuosity of the aorta. Electronically Signed   By: Fidela Salisbury M.D.   On: 09/21/2017 11:16   Dg Abd 2 Views  Result Date: 10/15/2017 CLINICAL DATA:  Ileus.  Decreased urine output EXAM: ABDOMEN - 2 VIEW COMPARISON:  09/28/2017 FINDINGS: Normal bowel gas pattern. No excessive stool retention. Interspinous spacer at L4-5. Clips and bowel sutures in the pelvis with right iliac stenting. History of Foley catheter with none visualized on this study. The visualized lung bases are clear. IMPRESSION: Normal bowel gas pattern. Electronically Signed   By: Monte Fantasia M.D.   On: 10/15/2017 19:09   Dg Abd Acute W/chest  Result Date: 09/23/2017 CLINICAL DATA:  Abdominal distension EXAM: DG ABDOMEN ACUTE W/ 1V CHEST COMPARISON:  09/21/2017, CT 09/19/2017 FINDINGS: Single-view chest demonstrates mild basilar atelectasis. Stable cardiomediastinal silhouette with aortic  atherosclerosis. Tiny calcified nodules at the lung bases as before. Supine and upright views of the abdomen demonstrate no free air beneath the diaphragm. Development of multiple loops of dilated air-filled small bowel measuring up to 3.1 cm. Postsurgical changes in the pelvis. Right iliac stent. IMPRESSION: 1. No radiographic evidence for acute cardiopulmonary abnormality. 2. Development of multiple air-filled dilated loops of small bowel with some distal gas present, findings could be secondary to an ileus, however partial small bowel obstruction could also produce this appearance. Electronically Signed   By: Donavan Foil M.D.   On: 09/23/2017 01:39   Dg Abd Portable 1v-small Bowel Obstruction Protocol-initial, 8 Hr Delay  Result Date: 09/28/2017 CLINICAL DATA:  Small bowel obstruction, continued surveillance. EXAM: PORTABLE ABDOMEN - 1 VIEW COMPARISON:  09/26/2017.  09/27/2017. FINDINGS: Enteric contrast is now seen in the RIGHT colon, indicating a degree of improvement. Enteric contrast remains within mildly prominent small bowel loops indicating a degree of partial obstruction. NG tube tip in the stomach. Spinal fixation device spans the L4 and L5 segments. IMPRESSION: Slight improvement partial SBO. Electronically Signed   By: Staci Righter M.D.   On: 09/28/2017 07:27   Dg Abd Portable 1v  Result Date: 09/24/2017 CLINICAL DATA:  NG tube placement EXAM: PORTABLE ABDOMEN - 1 VIEW COMPARISON:  09/23/2017, CT 09/19/2017 FINDINGS: Lung bases are clear. Esophageal tube tip overlies the proximal stomach. Persistent gaseous enlargement of small bowel up to 3.2 cm. IMPRESSION: 1. Esophageal tube tip overlies the proximal stomach 2. Persistent gaseous enlargement  of small bowel, suspect for bowel obstruction Electronically Signed   By: Donavan Foil M.D.   On: 09/24/2017 01:46   Dg Abd Portable 2v  Result Date: 09/27/2017 CLINICAL DATA:  Ileus EXAM: PORTABLE ABDOMEN - 2 VIEW COMPARISON:  CT abdomen and pelvis  September 24, 2017 and abdominal radiographs September 26, 2017 FINDINGS: Nasogastric tube tip and side port are in the proximal stomach. There is persistent relatively mild dilatation of multiple loops of small bowel without air-fluid levels. No free air. There is moderate stool in the colon. Lung bases are clear. There is postoperative change in the pelvis and lower lumbar spine regions. IMPRESSION: Nasogastric tube tip and side port in proximal stomach. Persistent loops of dilated small bowel, likely due to ileus or enteritis. Bowel obstruction felt to be less likely. No free air. Areas of postoperative change noted. Electronically Signed   By: Lowella Grip III M.D.   On: 09/27/2017 07:18   Dg Abd Portable 2v  Result Date: 09/26/2017 CLINICAL DATA:  83 year old male with recently diagnosed extraperitoneal bladder rupture. Ileus. EXAM: PORTABLE ABDOMEN - 2 VIEW COMPARISON:  CT Abdomen and Pelvis 09/24/2017 and earlier. FINDINGS: Portable AP supine and left lateral decubitus views at 1421 hours. No pneumoperitoneum. Enteric tube in place with side hole at the level of the gastric cardia. Negative lung bases. Mildly improved bowel gas pattern since 09/24/2017 but multiple gas-filled small bowel loops remain at the upper limits of normal to mildly dilated. There is large bowel gas present. Lower lumbar interspinous implant and pelvic surgical clips redemonstrated. No acute osseous abnormality identified. IMPRESSION: 1. Mildly improved bowel gas pattern since 09/24/2017. Multiple gas-filled small bowel loops remain compatible with a degree of residual ileus or partial obstruction. 2. No free air. Electronically Signed   By: Genevie Ann M.D.   On: 09/26/2017 15:07   Dg Swallowing Func-speech Pathology  Result Date: 10/17/2017 Objective Swallowing Evaluation: Type of Study: MBS-Modified Barium Swallow Study  Patient Details Name: [name] MRN: [id number] Date of Birth: 04-17-1934 Today's Date: 10/17/2017 Time: SLP Start  Time (ACUTE ONLY): 9233 -SLP Stop Time (ACUTE ONLY): 0076 SLP Time Calculation (min) (ACUTE ONLY): 18 min Past Medical History: Past Medical History: Diagnosis Date . Alzheimer's dementia 2013 . Anemia   HX OF ANEMIA . CHF (congestive heart failure) ([place])  . Colon cancer ([place])  . COPD (chronic obstructive pulmonary disease) ([place])  . Depression  . Dyspnea  . Hyperlipidemia  . Hypertension  . Pneumonia 2014 . PVD (peripheral vascular disease) ([place])  . Type II diabetes mellitus ([place])  Past Surgical History: Past Surgical History: Procedure Laterality Date . ABDOMINAL ANGIOGRAM N/A 04/20/2013  Procedure: ABDOMINAL ANGIOGRAM;  Surgeon: Angelia Mould, MD;  Location: [facility] CATH LAB;  Service: Cardiovascular;  Laterality: N/A; . ABDOMINAL AORTOGRAM W/LOWER EXTREMITY N/A 11/20/2016  Procedure: ABDOMINAL AORTOGRAM W/LOWER EXTREMITY;  Surgeon: Serafina Mitchell, MD;  Location: [facility] CV LAB;  Service: Cardiovascular;  Laterality: N/A; . CATARACT EXTRACTION Right 02/2013 . CATARACT EXTRACTION W/ INTRAOCULAR LENS IMPLANT Left 01/2013 . COLON RESECTION  1997  /enc. notes 05/17/2004 (04/28/2013) . COLON SURGERY  1996  cancer . ESOPHAGOGASTRODUODENOSCOPY N/A 09/05/2013  Procedure: ESOPHAGOGASTRODUODENOSCOPY (EGD);  Surgeon: Lear Ng, MD;  Location[facility] ENDOSCOPY;  Service: Endoscopy;  Laterality: N/A; . FEMORAL ARTERY - FEMORAL ARTERY BYPASS GRAFT    right to left/enc. notes 05/17/2004 (04/28/2013) . FEMORAL BYPASS Right   /enc. notes 05/17/2004 (04/28/2013) . FEMORAL-POPLITEAL BYPASS GRAFT Right 04/29/2013  Procedure:  FEMORAL-POPLITEAL  ARTERY Bypass Graft with intraoperative ultrasound and arteriogarm;  Surgeon: Mal Misty, MD;  Location: [facility];  Service: Vascular;  Laterality: Right; . FIBEROPTIC BRONCHOSCOPY    Yvette Rack. notes 03/16/2005 (04/28/2013) . INTRAOPERATIVE ARTERIOGRAM Right 04/29/2013  Procedure: INTRA OPERATIVE ARTERIOGRAM;  Surgeon: Mal Misty, MD;  Location: [facility];  Service: Vascular;  Laterality: Right; .  LOWER EXTREMITY ANGIOGRAM  04/20/2013  Procedure: LOWER EXTREMITY ANGIOGRAM;  Surgeon: Angelia Mould, MD;  Location: [facility] CATH LAB;  Service: Cardiovascular;; . LOWER EXTREMITY ANGIOGRAPHY Bilateral 06/25/2017  Procedure: LOWER EXTREMITY ANGIOGRAPHY;  Surgeon: Serafina Mitchell, MD;  Location: [facility] CV LAB;  Service: Cardiovascular;  Laterality: Bilateral; . [place] SURGERY  2005 . PERCUTANEOUS STENT INTERVENTION  04/20/2013  Procedure: PERCUTANEOUS STENT INTERVENTION;  Surgeon: Angelia Mould, MD;  Location: [facility] CATH LAB;  Service: Cardiovascular;;  right common iliac artery . PERIPHERAL VASCULAR CATHETERIZATION N/A 01/31/2016  Procedure: Abdominal Aortogram w/Lower Extremity;  Surgeon: Serafina Mitchell, MD;  Location: [facility];  Service: Cardiovascular;  Laterality: N/A; . PERIPHERAL VASCULAR CATHETERIZATION Right 01/31/2016  Procedure: Peripheral Vascular Atherectomy;  Surgeon: Serafina Mitchell, MD;  Location: [facility];  Service: Cardiovascular;  Laterality: Right;  Iliac common and external . PERIPHERAL VASCULAR CATHETERIZATION Right 01/31/2016  Procedure: Peripheral Vascular Intervention;  Surgeon: Serafina Mitchell, MD;  Location: [facility];  Service: Cardiovascular;  Laterality: Right;  external iliac and common iliac . PERIPHERAL VASCULAR CATHETERIZATION N/A 04/24/2016  Procedure: Abdominal Aortogram w/Lower Extremity;  Surgeon: Serafina Mitchell, MD;  Location: [facility];  Service: Cardiovascular;  Laterality: N/A; . PERIPHERAL VASCULAR CATHETERIZATION Right 04/24/2016  Procedure: Peripheral Vascular Intervention;  Surgeon: Serafina Mitchell, MD;  Location: [facility];  Service: Cardiovascular;  Laterality: Right;  external illiac Rt . SPINE SURGERY   . VASCULAR SURGERY   HPI: 83 year old male admitted with gross hematuria, etiology unclear. Past medical history of dementia, peripheral vascular disease status post right to left femoral-femoral graft, right  iliac stents, chronic systolic CHF, COPD, type 2 diabetes, history of colon cancer status post resection with hematuria. Assessed by SLP 12/11/13 with recommendation for dys 3/thin liquids. Referred for swallowing evaluation.  Pt found to have hypdrocephalus and has had 4 admits in six months.  Pt CXR right base reticular opacity with pulmonary microcalcifications seen on CT.   Subjective: pt awake in chair Assessment / Plan / Recommendation CHL IP CLINICAL IMPRESSIONS 10/17/2017 Clinical Impression Pt with mild oropharyngeal and cervical esophageal dysphagia.  No aspiration noted and trace penetration of thin cleared with same swallow.  Pt did have oral holding of barium- worse with liquids that may allow premature spillage of liquids into pharynx with increased risk of aspiration.  Pharyngeal swallow is strong without residuals.  Mild residuals of liquids in pharynx is likely due to increased pressures due to suspected esophageal deficits.  Prominent cricopharyngeus noted which resulted in stasis with liquids with backflow to distal pharynx and pudding without awareness.  Barium tablet given with thin appeared to clear readily.  However pt did have retrograde propulsion of liquids from distal to midesophagus.  Recommend he follow strict esophageal precautions.  Pt may be at risk of developing Zenker's diverticulum in the future.  Will follow for education to mitigate dysphagia/aspiration.  Thanks for this consult.  SLP Visit Diagnosis Dysphagia, pharyngoesophageal phase (R13.14);Dysphagia, oral phase (R13.11) Attention and concentration deficit following -- Frontal lobe and executive function deficit following -- Impact on  safety and function Mild aspiration risk   CHL IP TREATMENT RECOMMENDATION 10/17/2017 Treatment Recommendations Therapy as outlined in treatment plan below   Prognosis 10/17/2017 Prognosis for Safe Diet Advancement Guarded Barriers to Reach Goals Time post onset Barriers/Prognosis Comment -- CHL IP  DIET RECOMMENDATION 10/17/2017 SLP Diet Recommendations Dysphagia 3 (Mech soft) solids;Thin liquid Liquid Administration via Cup;Straw Medication Administration Whole meds with liquid Compensations Minimize environmental distractions;Slow rate;Small sips/bites;Other (Comment) Postural Changes Remain semi-upright after after feeds/meals (Comment);Seated upright at 90 degrees   CHL IP OTHER RECOMMENDATIONS 10/17/2017 Recommended Consults -- Oral Care Recommendations Oral care BID Other Recommendations --   CHL IP FOLLOW UP RECOMMENDATIONS 10/02/2017 Follow up Recommendations Skilled Nursing facility   [facility] IP FREQUENCY AND DURATION 10/17/2017 Speech Therapy Frequency (ACUTE ONLY) min 1 x/week Treatment Duration 1 week      CHL IP ORAL PHASE 10/17/2017 Oral Phase Impaired Oral - Pudding Teaspoon -- Oral - Pudding Cup -- Oral - Honey Teaspoon -- Oral - Honey Cup -- Oral - Nectar Teaspoon -- Oral - Nectar Cup Decreased bolus cohesion Oral - Nectar Straw -- Oral - Thin Teaspoon Delayed oral transit;Holding of bolus Oral - Thin Cup Delayed oral transit;Holding of bolus Oral - Thin Straw Delayed oral transit;Holding of bolus Oral - Puree Delayed oral transit Oral - Mech Soft Delayed oral transit Oral - Regular -- Oral - Multi-Consistency -- Oral - Pill -- Oral Phase - Comment --  CHL IP PHARYNGEAL PHASE 10/17/2017 Pharyngeal Phase Impaired Pharyngeal- Pudding Teaspoon -- Pharyngeal -- Pharyngeal- Pudding Cup -- Pharyngeal -- Pharyngeal- Honey Teaspoon -- Pharyngeal -- Pharyngeal- Honey Cup -- Pharyngeal -- Pharyngeal- Nectar Teaspoon -- Pharyngeal -- Pharyngeal- Nectar Cup Pharyngeal residue - pyriform;Pharyngeal residue - valleculae;WFL Pharyngeal -- Pharyngeal- Nectar Straw -- Pharyngeal -- Pharyngeal- Thin Teaspoon -- Pharyngeal -- Pharyngeal- Thin Cup Pharyngeal residue - pyriform;Pharyngeal residue - valleculae;WFL Pharyngeal -- Pharyngeal- Thin Straw WFL;Penetration/Aspiration during swallow Pharyngeal Material enters  airway, remains ABOVE vocal cords then ejected out Pharyngeal- Puree WFL Pharyngeal -- Pharyngeal- Mechanical Soft WFL Pharyngeal -- Pharyngeal- Regular -- Pharyngeal -- Pharyngeal- Multi-consistency -- Pharyngeal -- Pharyngeal- Pill WFL Pharyngeal -- Pharyngeal Comment --  CHL IP CERVICAL ESOPHAGEAL PHASE 10/17/2017 Cervical Esophageal Phase Impaired Pudding Teaspoon -- Pudding Cup -- Honey Teaspoon -- Honey Cup -- Nectar Teaspoon -- Nectar Cup Prominent cricopharyngeal segment Nectar Straw -- Thin Teaspoon Esophageal backflow into the pharynx;Prominent cricopharyngeal segment Thin Cup Prominent cricopharyngeal segment;Esophageal backflow into the pharynx Thin Straw Prominent cricopharyngeal segment;Esophageal backflow into the pharynx Puree Prominent cricopharyngeal segment;Other (Comment) Mechanical Soft Prominent cricopharyngeal segment Regular -- Multi-consistency -- Pill Prominent cricopharyngeal segment Cervical Esophageal Comment pt appears with prominent cp and backflow of thin liquids into distal pharynx without awareness Macario Golds 10/17/2017, 4:33 PM  Luanna Salk, [facility] SLP [phone]             Ct Renal Stone Study  Result Date: 09/19/2017 CLINICAL DATA:  Gross hematuria.  Urinary retention. EXAM: CT ABDOMEN AND PELVIS WITHOUT CONTRAST TECHNIQUE: Multidetector CT imaging of the abdomen and pelvis was performed following the standard protocol without IV contrast. COMPARISON:  None. FINDINGS: Lower chest: No acute abnormality. Hepatobiliary: No focal liver abnormality is seen. No gallstones, gallbladder wall thickening, or biliary dilatation. Pancreas: Unremarkable. No pancreatic ductal dilatation or surrounding inflammatory changes. Spleen: Normal in size without focal abnormality. Adrenals/Urinary Tract: Adrenal glands appear normal. Kidneys appear normal. No hydronephrosis or renal obstruction is noted. Mild diffuse wall thickening of urinary bladder is noted with small diverticulum  seen  superiorly. Blood is noted in the dependent portion of the urinary bladder lumen. Stomach/Bowel: Stomach is within normal limits. Appendix appears normal. No evidence of bowel wall thickening, distention, or inflammatory changes. Vascular/Lymphatic: Aortic atherosclerosis. No enlarged abdominal or pelvic lymph nodes. Reproductive: Prostate is unremarkable. Other: No abdominal wall hernia or abnormality. No abdominopelvic ascites. Musculoskeletal: Postsurgical and degenerative changes are noted in the lower lumbar spine. No acute abnormality is noted. IMPRESSION: No renal or ureteral calculi are noted.  No hydronephrosis is noted. Mild diffuse urinary bladder wall thickening is noted concerning for cystitis. Small bladder diverticulum is noted superiorly. Probable hemorrhage is noted within the urinary bladder lumen; cystoscopy is recommended to rule out possible neoplasm or malignancy. Aortic Atherosclerosis (ICD10-I70.0). Electronically Signed   By: Marijo Conception, M.D.   On: 09/19/2017 15:08      Subjective: No issues overnight  Discharge Exam: Vitals:   10/18/17 0411 10/18/17 0900  BP: (!) 148/86   Pulse: 76   Resp: 20   Temp: 98 F (36.7 C)   SpO2: 99% 99%   Vitals:   10/17/17 2020 10/18/17 0036 10/18/17 0411 10/18/17 0900  BP: 139/77 (!) 160/80 (!) 148/86   Pulse: 78 89 76   Resp: 20  20   Temp: 98 F (36.7 C)  98 F (36.7 C)   TempSrc: Oral  Oral   SpO2: 92%  99% 99%  Weight:      Height:        General: Pt is alert, awake, not in acute distress Cardiovascular: RRR, S1/S2 +, no rubs, no gallops Respiratory: CTA bilaterally, no wheezing, no rhonchi Abdominal: Soft, NT, ND, bowel sounds + Extremities: no edema, no cyanosis    The results of significant diagnostics from this hospitalization (including imaging, microbiology, ancillary and laboratory) are listed below for reference.     Microbiology: Recent Results (from the past 240 hour(s))  Urine culture     Status:  Abnormal   Collection Time: 10/15/17  6:14 PM  Result Value Ref Range Status   Specimen Description   Final    URINE, CLEAN CATCH Performed at [facility], [place] [address], [place], [place] [address]    Special Requests   Final    NONE Performed at [facility], [place] [address] [place], [place] [address]    Culture >=100,000 COLONIES/mL ENTEROCOCCUS FAECALIS (A)  Final   Report Status 10/18/2017 FINAL  Final   Organism ID, Bacteria ENTEROCOCCUS FAECALIS (A)  Final      Susceptibility   Enterococcus faecalis - MIC*    AMPICILLIN <=2 SENSITIVE Sensitive     LEVOFLOXACIN >=8 RESISTANT Resistant     NITROFURANTOIN <=16 SENSITIVE Sensitive     VANCOMYCIN 1 SENSITIVE Sensitive     * >=100,000 COLONIES/mL ENTEROCOCCUS FAECALIS  MRSA PCR Screening     Status: None   Collection Time: 10/16/17 12:03 AM  Result Value Ref Range Status   MRSA by PCR NEGATIVE NEGATIVE Final    Comment:        The GeneXpert MRSA Assay (FDA approved for NASAL specimens only), is one component of a comprehensive MRSA colonization surveillance program. It is not intended to diagnose MRSA infection nor to guide or monitor treatment for MRSA infections. Performed at [facility], [place] [address], [place], [place] [address]      Labs: BNP (last 3 results) No results for input(s): BNP in the last 8760 hours. Basic Metabolic Panel: Recent Labs  Lab 10/15/17 1842 10/16/17 0455 10/17/17 0640  NA 141 141 138  K 5.8* 4.4 4.0  CL 104 109 106  CO2 23 25 23   GLUCOSE 209* 139* 134*  BUN 33* 24* 16  CREATININE 2.09* 1.03 0.91  CALCIUM 10.0 8.9 8.8*   Liver Function Tests: No results for input(s): AST, ALT, ALKPHOS, BILITOT, PROT, ALBUMIN in the last 168 hours. No results for input(s): LIPASE, AMYLASE in the last 168 hours. No results for input(s): AMMONIA in the last 168 hours. CBC: Recent Labs  Lab 10/15/17 1842 10/16/17 0455 10/17/17 0640    WBC 26.7* 18.4* 13.8*  NEUTROABS 23.8*  --   --   HGB 13.7 10.7* 10.9*  HCT 42.2 33.9* 34.8*  MCV 88.7 87.8 89.7  PLT 492* 405* 379   Cardiac Enzymes: No results for input(s): CKTOTAL, CKMB, CKMBINDEX, TROPONINI in the last 168 hours. BNP: Invalid input(s): POCBNP CBG: Recent Labs  Lab 10/17/17 1632 10/17/17 2022 10/18/17 0014 10/18/17 0412 10/18/17 0739  GLUCAP 160* 119* 96 140* 123*   D-Dimer No results for input(s): DDIMER in the last 72 hours. Hgb A1c No results for input(s): HGBA1C in the last 72 hours. Lipid Profile No results for input(s): CHOL, HDL, LDLCALC, TRIG, CHOLHDL, LDLDIRECT in the last 72 hours. Thyroid function studies No results for input(s): TSH, T4TOTAL, T3FREE, THYROIDAB in the last 72 hours.  Invalid input(s): FREET3 Anemia work up No results for input(s): VITAMINB12, FOLATE, FERRITIN, TIBC, IRON, RETICCTPCT in the last 72 hours. Urinalysis    Component Value Date/Time   COLORURINE RED (A) 10/15/2017 1814   APPEARANCEUR TURBID (A) 10/15/2017 1814   LABSPEC  10/15/2017 1814    TEST NOT REPORTED DUE TO COLOR INTERFERENCE OF URINE PIGMENT   PHURINE  10/15/2017 1814    TEST NOT REPORTED DUE TO COLOR INTERFERENCE OF URINE PIGMENT   GLUCOSEU (A) 10/15/2017 1814    TEST NOT REPORTED DUE TO COLOR INTERFERENCE OF URINE PIGMENT   HGBUR (A) 10/15/2017 1814    TEST NOT REPORTED DUE TO COLOR INTERFERENCE OF URINE PIGMENT   BILIRUBINUR (A) 10/15/2017 1814    TEST NOT REPORTED DUE TO COLOR INTERFERENCE OF URINE PIGMENT   BILIRUBINUR negative 05/23/2017 0855   KETONESUR (A) 10/15/2017 1814    TEST NOT REPORTED DUE TO COLOR INTERFERENCE OF URINE PIGMENT   PROTEINUR (A) 10/15/2017 1814    TEST NOT REPORTED DUE TO COLOR INTERFERENCE OF URINE PIGMENT   UROBILINOGEN 0.2 05/23/2017 0855   UROBILINOGEN 1.0 12/10/2013 1456   NITRITE (A) 10/15/2017 1814    TEST NOT REPORTED DUE TO COLOR INTERFERENCE OF URINE PIGMENT   LEUKOCYTESUR (A) 10/15/2017 1814    TEST  NOT REPORTED DUE TO COLOR INTERFERENCE OF URINE PIGMENT   Sepsis Labs Invalid input(s): PROCALCITONIN,  WBC,  LACTICIDVEN Microbiology Recent Results (from the past 240 hour(s))  Urine culture     Status: Abnormal   Collection Time: 10/15/17  6:14 PM  Result Value Ref Range Status   Specimen Description   Final    URINE, CLEAN CATCH Performed at [facility], [place] [address], [place], [place] [address]    Special Requests   Final    NONE Performed at [facility], [place] [address], [place], [place] [address]    Culture >=100,000 COLONIES/mL ENTEROCOCCUS FAECALIS (A)  Final   Report Status 10/18/2017 FINAL  Final   Organism ID, Bacteria ENTEROCOCCUS FAECALIS (A)  Final      Susceptibility   Enterococcus  faecalis - MIC*    AMPICILLIN <=2 SENSITIVE Sensitive     LEVOFLOXACIN >=8 RESISTANT Resistant     NITROFURANTOIN <=16 SENSITIVE Sensitive     VANCOMYCIN 1 SENSITIVE Sensitive     * >=100,000 COLONIES/mL ENTEROCOCCUS FAECALIS  MRSA PCR Screening     Status: None   Collection Time: 10/16/17 12:03 AM  Result Value Ref Range Status   MRSA by PCR NEGATIVE NEGATIVE Final    Comment:        The GeneXpert MRSA Assay (FDA approved for NASAL specimens only), is one component of a comprehensive MRSA colonization surveillance program. It is not intended to diagnose MRSA infection nor to guide or monitor treatment for MRSA infections. Performed at [facility], [place] [address] [place], [place] [address]      SIGNED:   Cordelia Poche, MD [facility] 10/18/2017, 12:23 PM

## 2017-10-18 NOTE — Progress Notes (Signed)
Discharge instructions (including medications) discussed with and copy provided to patient/caregiver 

## 2017-10-21 ENCOUNTER — Other Ambulatory Visit: Payer: Self-pay | Admitting: Licensed Clinical Social Worker

## 2017-10-21 ENCOUNTER — Other Ambulatory Visit: Payer: Self-pay | Admitting: *Deleted

## 2017-10-21 DIAGNOSIS — Z741 Need for assistance with personal care: Secondary | ICD-10-CM | POA: Diagnosis not present

## 2017-10-21 DIAGNOSIS — N390 Urinary tract infection, site not specified: Secondary | ICD-10-CM | POA: Diagnosis not present

## 2017-10-21 DIAGNOSIS — I739 Peripheral vascular disease, unspecified: Secondary | ICD-10-CM | POA: Diagnosis not present

## 2017-10-21 DIAGNOSIS — E785 Hyperlipidemia, unspecified: Secondary | ICD-10-CM | POA: Diagnosis not present

## 2017-10-21 DIAGNOSIS — K219 Gastro-esophageal reflux disease without esophagitis: Secondary | ICD-10-CM | POA: Diagnosis not present

## 2017-10-21 DIAGNOSIS — R262 Difficulty in walking, not elsewhere classified: Secondary | ICD-10-CM | POA: Diagnosis not present

## 2017-10-21 DIAGNOSIS — E119 Type 2 diabetes mellitus without complications: Secondary | ICD-10-CM | POA: Diagnosis not present

## 2017-10-21 DIAGNOSIS — I10 Essential (primary) hypertension: Secondary | ICD-10-CM | POA: Diagnosis not present

## 2017-10-21 DIAGNOSIS — R54 Age-related physical debility: Secondary | ICD-10-CM | POA: Diagnosis not present

## 2017-10-21 DIAGNOSIS — F0390 Unspecified dementia without behavioral disturbance: Secondary | ICD-10-CM | POA: Diagnosis not present

## 2017-10-21 DIAGNOSIS — M6281 Muscle weakness (generalized): Secondary | ICD-10-CM | POA: Diagnosis not present

## 2017-10-21 DIAGNOSIS — J449 Chronic obstructive pulmonary disease, unspecified: Secondary | ICD-10-CM | POA: Diagnosis not present

## 2017-10-21 DIAGNOSIS — I5021 Acute systolic (congestive) heart failure: Secondary | ICD-10-CM | POA: Diagnosis not present

## 2017-10-21 DIAGNOSIS — J441 Chronic obstructive pulmonary disease with (acute) exacerbation: Secondary | ICD-10-CM | POA: Diagnosis not present

## 2017-10-21 DIAGNOSIS — R52 Pain, unspecified: Secondary | ICD-10-CM | POA: Diagnosis not present

## 2017-10-21 NOTE — Patient Outreach (Signed)
[facility]) Care Management  10/21/2017  [name] [birth year] [id number]  [facility] CSW received the following update that patient discharged to [facility] on 10/18/17 with medicaid pending for longterm care placement. Patient's pending Medicaid # is [id number] P. [facility] CSW will complete case closure at this time as patient will be placed at [facility] SNF and will reside there long term.   [name], BSW, MSW, [email or web address] Phone: [phone] Fax: [phone]

## 2017-10-21 NOTE — Consult Note (Signed)
[facility] Care Management follow up.  Reviewed inpatient LCSW's notes. [name] discharged to [facility] with Medicaid pending for long term care at the SNF.   Updated [facility] LCSW.  Marthenia Rolling, MSN-Ed, RN,BSN [facility] Liaison [phone]

## 2017-10-22 DIAGNOSIS — I5021 Acute systolic (congestive) heart failure: Secondary | ICD-10-CM | POA: Diagnosis not present

## 2017-10-22 DIAGNOSIS — J449 Chronic obstructive pulmonary disease, unspecified: Secondary | ICD-10-CM | POA: Diagnosis not present

## 2017-10-22 DIAGNOSIS — K219 Gastro-esophageal reflux disease without esophagitis: Secondary | ICD-10-CM | POA: Diagnosis not present

## 2017-10-22 DIAGNOSIS — E119 Type 2 diabetes mellitus without complications: Secondary | ICD-10-CM | POA: Diagnosis not present

## 2017-10-22 DIAGNOSIS — R05 Cough: Secondary | ICD-10-CM | POA: Diagnosis not present

## 2017-10-22 DIAGNOSIS — E785 Hyperlipidemia, unspecified: Secondary | ICD-10-CM | POA: Diagnosis not present

## 2017-10-22 DIAGNOSIS — I10 Essential (primary) hypertension: Secondary | ICD-10-CM | POA: Diagnosis not present

## 2017-10-22 DIAGNOSIS — I739 Peripheral vascular disease, unspecified: Secondary | ICD-10-CM | POA: Diagnosis not present

## 2017-10-22 DIAGNOSIS — R54 Age-related physical debility: Secondary | ICD-10-CM | POA: Diagnosis not present

## 2017-10-22 DIAGNOSIS — R52 Pain, unspecified: Secondary | ICD-10-CM | POA: Diagnosis not present

## 2017-10-22 DIAGNOSIS — N390 Urinary tract infection, site not specified: Secondary | ICD-10-CM | POA: Diagnosis not present

## 2017-10-22 DIAGNOSIS — K5900 Constipation, unspecified: Secondary | ICD-10-CM | POA: Diagnosis not present

## 2017-10-24 DIAGNOSIS — J449 Chronic obstructive pulmonary disease, unspecified: Secondary | ICD-10-CM | POA: Diagnosis not present

## 2017-10-24 DIAGNOSIS — R262 Difficulty in walking, not elsewhere classified: Secondary | ICD-10-CM | POA: Diagnosis not present

## 2017-10-24 DIAGNOSIS — W19XXXA Unspecified fall, initial encounter: Secondary | ICD-10-CM | POA: Diagnosis not present

## 2017-10-24 DIAGNOSIS — K5900 Constipation, unspecified: Secondary | ICD-10-CM | POA: Diagnosis not present

## 2017-10-24 DIAGNOSIS — E119 Type 2 diabetes mellitus without complications: Secondary | ICD-10-CM | POA: Diagnosis not present

## 2017-10-24 DIAGNOSIS — J441 Chronic obstructive pulmonary disease with (acute) exacerbation: Secondary | ICD-10-CM | POA: Diagnosis not present

## 2017-10-24 DIAGNOSIS — Z741 Need for assistance with personal care: Secondary | ICD-10-CM | POA: Diagnosis not present

## 2017-10-24 DIAGNOSIS — I5021 Acute systolic (congestive) heart failure: Secondary | ICD-10-CM | POA: Diagnosis not present

## 2017-10-24 DIAGNOSIS — I10 Essential (primary) hypertension: Secondary | ICD-10-CM | POA: Diagnosis not present

## 2017-10-24 DIAGNOSIS — E785 Hyperlipidemia, unspecified: Secondary | ICD-10-CM | POA: Diagnosis not present

## 2017-10-24 DIAGNOSIS — R52 Pain, unspecified: Secondary | ICD-10-CM | POA: Diagnosis not present

## 2017-10-24 DIAGNOSIS — M6281 Muscle weakness (generalized): Secondary | ICD-10-CM | POA: Diagnosis not present

## 2017-10-24 DIAGNOSIS — I739 Peripheral vascular disease, unspecified: Secondary | ICD-10-CM | POA: Diagnosis not present

## 2017-10-24 DIAGNOSIS — R54 Age-related physical debility: Secondary | ICD-10-CM | POA: Diagnosis not present

## 2017-10-24 DIAGNOSIS — N390 Urinary tract infection, site not specified: Secondary | ICD-10-CM | POA: Diagnosis not present

## 2017-10-24 DIAGNOSIS — K219 Gastro-esophageal reflux disease without esophagitis: Secondary | ICD-10-CM | POA: Diagnosis not present

## 2017-10-25 ENCOUNTER — Other Ambulatory Visit: Payer: Self-pay | Admitting: Pharmacist

## 2017-10-25 NOTE — Patient Outreach (Signed)
[place] [facility]) Care Management  10/25/2017  [name] 03/30/1934 [id number]   Patient's pharmacy case is being officially closed as the patient will be going to the [facility] on 10/18/17 with medicaid pending for longterm care placement.   Elayne Guerin, PharmD, [place] Clinical Pharmacist [phone]

## 2017-10-31 DIAGNOSIS — I5021 Acute systolic (congestive) heart failure: Secondary | ICD-10-CM | POA: Diagnosis not present

## 2017-10-31 DIAGNOSIS — N390 Urinary tract infection, site not specified: Secondary | ICD-10-CM | POA: Diagnosis not present

## 2017-10-31 DIAGNOSIS — J449 Chronic obstructive pulmonary disease, unspecified: Secondary | ICD-10-CM | POA: Diagnosis not present

## 2017-10-31 DIAGNOSIS — R05 Cough: Secondary | ICD-10-CM | POA: Diagnosis not present

## 2017-10-31 DIAGNOSIS — R52 Pain, unspecified: Secondary | ICD-10-CM | POA: Diagnosis not present

## 2017-10-31 DIAGNOSIS — K219 Gastro-esophageal reflux disease without esophagitis: Secondary | ICD-10-CM | POA: Diagnosis not present

## 2017-10-31 DIAGNOSIS — I739 Peripheral vascular disease, unspecified: Secondary | ICD-10-CM | POA: Diagnosis not present

## 2017-10-31 DIAGNOSIS — R54 Age-related physical debility: Secondary | ICD-10-CM | POA: Diagnosis not present

## 2017-10-31 DIAGNOSIS — E119 Type 2 diabetes mellitus without complications: Secondary | ICD-10-CM | POA: Diagnosis not present

## 2017-10-31 DIAGNOSIS — N3289 Other specified disorders of bladder: Secondary | ICD-10-CM | POA: Diagnosis not present

## 2017-10-31 DIAGNOSIS — E785 Hyperlipidemia, unspecified: Secondary | ICD-10-CM | POA: Diagnosis not present

## 2017-10-31 DIAGNOSIS — I10 Essential (primary) hypertension: Secondary | ICD-10-CM | POA: Diagnosis not present

## 2017-10-31 DIAGNOSIS — R310 Gross hematuria: Secondary | ICD-10-CM | POA: Diagnosis not present

## 2017-10-31 DIAGNOSIS — K5900 Constipation, unspecified: Secondary | ICD-10-CM | POA: Diagnosis not present

## 2017-11-07 DIAGNOSIS — F0150 Vascular dementia without behavioral disturbance: Secondary | ICD-10-CM | POA: Diagnosis not present

## 2017-11-11 ENCOUNTER — Other Ambulatory Visit: Payer: Self-pay | Admitting: Pharmacist

## 2017-11-11 NOTE — Patient Outreach (Signed)
[place]) Care Management  11/11/2017  [name] [birth year] [id number]   Incoming call from patient's wife, [name], in response to the EMMI Medication Adherence Campaign on behalf of [name].   [name] reports that she has had all of the patient's medications put on hold at their local pharmacy as the patient is currently in a nursing facility. Per note in Epic from [facility] Pharmacist Denyse Amass, patient went to the [facility] on 10/18/17 with medicaid pending for longterm care placement.  Will close pharmacy episode at this time.  Harlow Asa, PharmD, [place] [facility] [phone]

## 2017-11-14 DIAGNOSIS — R310 Gross hematuria: Secondary | ICD-10-CM | POA: Diagnosis not present

## 2017-11-14 DIAGNOSIS — S3729XD Other injury of bladder, subsequent encounter: Secondary | ICD-10-CM | POA: Diagnosis not present

## 2017-11-16 DIAGNOSIS — I10 Essential (primary) hypertension: Secondary | ICD-10-CM | POA: Diagnosis not present

## 2017-11-16 DIAGNOSIS — R531 Weakness: Secondary | ICD-10-CM | POA: Diagnosis not present

## 2017-11-16 DIAGNOSIS — F0390 Unspecified dementia without behavioral disturbance: Secondary | ICD-10-CM | POA: Diagnosis not present

## 2017-11-16 DIAGNOSIS — E119 Type 2 diabetes mellitus without complications: Secondary | ICD-10-CM | POA: Diagnosis not present

## 2017-11-16 DIAGNOSIS — J189 Pneumonia, unspecified organism: Secondary | ICD-10-CM | POA: Diagnosis not present

## 2017-11-16 DIAGNOSIS — I509 Heart failure, unspecified: Secondary | ICD-10-CM | POA: Diagnosis not present

## 2017-11-16 DIAGNOSIS — I517 Cardiomegaly: Secondary | ICD-10-CM | POA: Diagnosis not present

## 2017-11-16 DIAGNOSIS — J441 Chronic obstructive pulmonary disease with (acute) exacerbation: Secondary | ICD-10-CM | POA: Diagnosis not present

## 2017-11-16 DIAGNOSIS — Z79899 Other long term (current) drug therapy: Secondary | ICD-10-CM | POA: Diagnosis not present

## 2017-11-18 ENCOUNTER — Ambulatory Visit (HOSPITAL_COMMUNITY)
Admission: RE | Admit: 2017-11-18 | Discharge: 2017-11-18 | Disposition: A | Discharge status: Home / Self Care | Payer: PPO | Source: Ambulatory Visit | Attending: Surgery | Admitting: Surgery

## 2017-11-18 ENCOUNTER — Emergency Department (HOSPITAL_COMMUNITY)
Admission: EM | Admit: 2017-11-18 | Discharge: 2017-11-24 | Disposition: E | Payer: PPO | Attending: Emergency Medicine | Admitting: Emergency Medicine

## 2017-11-18 ENCOUNTER — Other Ambulatory Visit: Payer: Self-pay

## 2017-11-18 ENCOUNTER — Encounter (HOSPITAL_COMMUNITY): Payer: Self-pay

## 2017-11-18 ENCOUNTER — Emergency Department (HOSPITAL_COMMUNITY): Payer: PPO

## 2017-11-18 ENCOUNTER — Ambulatory Visit (INDEPENDENT_AMBULATORY_CARE_PROVIDER_SITE_OTHER): Payer: Self-pay | Admitting: Surgery

## 2017-11-18 DIAGNOSIS — Z7902 Long term (current) use of antithrombotics/antiplatelets: Secondary | ICD-10-CM | POA: Diagnosis not present

## 2017-11-18 DIAGNOSIS — F0390 Unspecified dementia without behavioral disturbance: Secondary | ICD-10-CM | POA: Insufficient documentation

## 2017-11-18 DIAGNOSIS — R404 Transient alteration of awareness: Secondary | ICD-10-CM | POA: Diagnosis not present

## 2017-11-18 DIAGNOSIS — I739 Peripheral vascular disease, unspecified: Secondary | ICD-10-CM

## 2017-11-18 DIAGNOSIS — I779 Disorder of arteries and arterioles, unspecified: Secondary | ICD-10-CM

## 2017-11-18 DIAGNOSIS — Z79899 Other long term (current) drug therapy: Secondary | ICD-10-CM | POA: Diagnosis not present

## 2017-11-18 DIAGNOSIS — I469 Cardiac arrest, cause unspecified: Secondary | ICD-10-CM | POA: Diagnosis not present

## 2017-11-18 DIAGNOSIS — R0689 Other abnormalities of breathing: Secondary | ICD-10-CM | POA: Diagnosis not present

## 2017-11-18 DIAGNOSIS — R0602 Shortness of breath: Secondary | ICD-10-CM | POA: Insufficient documentation

## 2017-11-18 DIAGNOSIS — E109 Type 1 diabetes mellitus without complications: Secondary | ICD-10-CM | POA: Diagnosis not present

## 2017-11-18 DIAGNOSIS — I771 Stricture of artery: Secondary | ICD-10-CM

## 2017-11-18 DIAGNOSIS — R55 Syncope and collapse: Secondary | ICD-10-CM | POA: Diagnosis present

## 2017-11-18 DIAGNOSIS — R0902 Hypoxemia: Secondary | ICD-10-CM | POA: Diagnosis not present

## 2017-11-18 DIAGNOSIS — I10 Essential (primary) hypertension: Secondary | ICD-10-CM | POA: Insufficient documentation

## 2017-11-18 DIAGNOSIS — J449 Chronic obstructive pulmonary disease, unspecified: Secondary | ICD-10-CM | POA: Insufficient documentation

## 2017-11-18 DIAGNOSIS — Z87891 Personal history of nicotine dependence: Secondary | ICD-10-CM | POA: Diagnosis not present

## 2017-11-18 DIAGNOSIS — Z7984 Long term (current) use of oral hypoglycemic drugs: Secondary | ICD-10-CM | POA: Insufficient documentation

## 2017-11-18 DIAGNOSIS — R4020 Unspecified coma: Secondary | ICD-10-CM | POA: Diagnosis not present

## 2017-11-18 DIAGNOSIS — R230 Cyanosis: Secondary | ICD-10-CM | POA: Diagnosis not present

## 2017-11-18 LAB — COMPREHENSIVE METABOLIC PANEL
ALBUMIN: 3 g/dL — AB (ref 3.5–5.0)
ALT: 31 U/L (ref 0–44)
AST: 51 U/L — ABNORMAL HIGH (ref 15–41)
Alkaline Phosphatase: 69 U/L (ref 38–126)
Anion gap: 14 (ref 5–15)
BILIRUBIN TOTAL: 0.9 mg/dL (ref 0.3–1.2)
BUN: 24 mg/dL — ABNORMAL HIGH (ref 8–23)
CO2: 18 mmol/L — AB (ref 22–32)
Calcium: 9 mg/dL (ref 8.9–10.3)
Chloride: 108 mmol/L (ref 98–111)
Creatinine, Ser: 1.17 mg/dL (ref 0.61–1.24)
GFR calc Af Amer: >60 mL/min (ref >60)
GFR calc non Af Amer: 57 mL/min — ABNORMAL LOW (ref >60)
GLUCOSE: 209 mg/dL — AB (ref 70–99)
POTASSIUM: 4.9 mmol/L (ref 3.5–5.1)
Sodium: 140 mmol/L (ref 135–145)
TOTAL PROTEIN: 6.3 g/dL — AB (ref 6.5–8.1)

## 2017-11-18 LAB — CBC WITH DIFFERENTIAL/PLATELET
Abs Immature Granulocytes: 0.5 10*3/uL — ABNORMAL HIGH (ref 0.0–0.1)
Basophils Absolute: 0 10*3/uL (ref 0.0–0.1)
Basophils Relative: 0 %
EOS ABS: 0 10*3/uL (ref 0.0–0.7)
Eosinophils Relative: 0 %
HEMATOCRIT: 35.7 % — AB (ref 39.0–52.0)
HEMOGLOBIN: 10.4 g/dL — AB (ref 13.0–17.0)
IMMATURE GRANULOCYTES: 5 %
LYMPHS ABS: 2.2 10*3/uL (ref 0.7–4.0)
LYMPHS PCT: 19 %
MCH: 27 pg (ref 26.0–34.0)
MCHC: 29.1 g/dL — ABNORMAL LOW (ref 30.0–36.0)
MCV: 92.7 fL (ref 78.0–100.0)
Monocytes Absolute: 0.9 10*3/uL (ref 0.1–1.0)
Monocytes Relative: 8 %
NEUTROS PCT: 68 %
Neutro Abs: 7.6 10*3/uL (ref 1.7–7.7)
Platelets: 262 10*3/uL (ref 150–400)
RBC: 3.85 MIL/uL — AB (ref 4.22–5.81)
RDW: 15.2 % (ref 11.5–15.5)
WBC: 11.2 10*3/uL — AB (ref 4.0–10.5)

## 2017-11-18 LAB — I-STAT CG4 LACTIC ACID, ED: LACTIC ACID, VENOUS: 7.21 mmol/L — AB (ref 0.5–1.9)

## 2017-11-18 LAB — MAGNESIUM: MAGNESIUM: 2 mg/dL (ref 1.7–2.4)

## 2017-11-18 LAB — BRAIN NATRIURETIC PEPTIDE: B NATRIURETIC PEPTIDE 5: 1896.8 pg/mL — AB (ref 0.0–100.0)

## 2017-11-18 LAB — I-STAT TROPONIN, ED: Troponin i, poc: 0.31 ng/mL (ref 0.00–0.08)

## 2017-11-18 MED ORDER — ALBUTEROL SULFATE (2.5 MG/3ML) 0.083% IN NEBU
2.5000 mg | INHALATION_SOLUTION | Freq: Once | RESPIRATORY_TRACT | Status: AC
  Administered 2017-11-18: 2.5 mg via RESPIRATORY_TRACT

## 2017-11-18 MED ORDER — NOREPINEPHRINE 4 MG/250ML-% IV SOLN
0.0000 ug/min | INTRAVENOUS | Status: DC
  Filled 2017-11-18: qty 250

## 2017-11-18 MED ORDER — ALBUTEROL SULFATE (2.5 MG/3ML) 0.083% IN NEBU
INHALATION_SOLUTION | RESPIRATORY_TRACT | Status: AC
  Filled 2017-11-18: qty 3

## 2017-11-18 MED ORDER — METHYLPREDNISOLONE SODIUM SUCC 125 MG IJ SOLR: 125.0000 mg | Freq: Once | INTRAMUSCULAR | Status: DC

## 2017-11-18 MED ORDER — ETOMIDATE 2 MG/ML IV SOLN
INTRAVENOUS | Status: DC | PRN
  Administered 2017-11-18: 20 mg via INTRAVENOUS

## 2017-11-18 MED ORDER — ROCURONIUM BROMIDE 50 MG/5ML IV SOLN
INTRAVENOUS | Status: DC | PRN
  Administered 2017-11-18: 50 mg via INTRAVENOUS

## 2017-11-18 MED ORDER — SODIUM CHLORIDE 0.9 % IV SOLN
INTRAVENOUS | Status: DC | PRN
  Administered 2017-11-18: 1000 mL via INTRAVENOUS

## 2017-11-18 MED ORDER — DEXTROSE 5 % IV SOLN
INTRAVENOUS | Status: DC | PRN
  Administered 2017-11-18: 300 mg via INTRAVENOUS

## 2017-11-18 MED ORDER — IPRATROPIUM-ALBUTEROL 0.5-2.5 (3) MG/3ML IN SOLN: 3.0000 mL | Freq: Once | RESPIRATORY_TRACT | Status: DC

## 2017-11-18 MED ORDER — EPINEPHRINE PF 1 MG/10ML IJ SOSY
PREFILLED_SYRINGE | INTRAMUSCULAR | Status: DC | PRN
  Administered 2017-11-18 (×3): 1 mg via INTRAVENOUS

## 2017-11-18 MED FILL — Medication: Qty: 1 | Status: AC

## 2017-11-24 NOTE — ED Provider Notes (Signed)
[facility] Provider Note   CSN: [id number] Arrival date & time: 19-Nov-2017  1026     History   Chief Complaint Chief Complaint  Patient presents with  . Post CPR    HPI [name] is a 83 y.o. male.  The history is provided by the spouse and a relative. The history is limited by the condition of the patient.  Cardiac Arrest  Witnessed by:  Healthcare provider Incident location: at clinic. Time since incident:  30 minutes Time before BLS initiated:  Immediate Time before ALS initiated:  Immediate Condition upon EMS arrival:  Unresponsive Pulse:  Present Initial cardiac rhythm per EMS:  Normal sinus Treatments prior to arrival:  ACLS protocol Meds prior to arrival: ns. Airway:  Nasopharyngeal Rhythm on admission to ED:  Unchanged Risk factors: COPD     Past Medical History:  Diagnosis Date  . Alzheimer's dementia 2013  . Anemia    HX OF ANEMIA  . CHF (congestive heart failure) ([place])   . Colon cancer ([place])   . COPD (chronic obstructive pulmonary disease) ([name])   . Depression   . Dyspnea   . Hyperlipidemia   . Hypertension   . Pneumonia 2014  . PVD (peripheral vascular disease) ([place])   . Type II diabetes mellitus [facility])     Patient Active Problem List   Diagnosis Date Noted  . Urinary bladder disorder 10/05/2017  . Acute urinary retention 10/05/2017  . Metabolic acidosis 96/28/3662  . Metabolic encephalopathy 94/76/5465  . Accelerated hypertension 10/05/2017  . Abdominal distention   . AKI (acute kidney injury) ([place])   . Goals of care, counseling/discussion   . Adynamic ileus ([place])   . Palliative care encounter   . Pressure injury of skin 09/25/2017  . Hematuria 09/19/2017  . Gross hematuria 09/19/2017  . Sepsis ([place]) 05/02/2017  . Diabetes mellitus ([place]) 12/25/2013  . Type I (juvenile type) diabetes mellitus with peripheral circulatory disorders, not stated as uncontrolled(250.71) 12/18/2013  . Acute DVT  (deep venous thrombosis) ([place]) 12/12/2013  . PNA (pneumonia) 12/11/2013  . Candida esophagitis ([place]) 09/06/2013  . Chronic systolic CHF (congestive heart failure) ([place]) 09/06/2013  . CAP (community acquired pneumonia) 09/05/2013  . COPD exacerbation ([place]) 09/04/2013  . Microcytic anemia 09/03/2013  . Chronic respiratory failure with hypoxia ([place]) 09/01/2013  . Symptomatic anemia 09/01/2013  . DOE (dyspnea on exertion) 09/01/2013  . Atherosclerosis of native arteries of the extremities with ulceration ([place]) 06/24/2013  . PAD (peripheral artery disease) ([place]) 04/28/2013  . Chest pain 03/23/2013  . Nausea with vomiting 03/23/2013  . Pain in joint, ankle and foot 03/23/2013  . Dementia 07/04/2012  . URI (upper respiratory infection) 07/04/2012  . PVD (peripheral vascular disease) ([place]) 04/21/2012  . Leg pain 04/18/2012  . Upper airway cough syndrome 06/02/2007  . Hyperlipidemia 05/07/2007  . PVD 05/07/2007  . COPD COPD II  05/07/2007  . DYSPNEA 05/07/2007  . Essential hypertension 05/07/2007    Past Surgical History:  Procedure Laterality Date  . ABDOMINAL ANGIOGRAM N/A 04/20/2013   Procedure: ABDOMINAL ANGIOGRAM;  Surgeon: Angelia Mould, MD;  Location: [facility] CATH LAB;  Service: Cardiovascular;  Laterality: N/A;  . ABDOMINAL AORTOGRAM W/LOWER EXTREMITY N/A 11/20/2016   Procedure: ABDOMINAL AORTOGRAM W/LOWER EXTREMITY;  Surgeon: Serafina Mitchell, MD;  Location: [facility] CV LAB;  Service: Cardiovascular;  Laterality: N/A;  . CATARACT EXTRACTION Right 02/2013  . CATARACT EXTRACTION W/ INTRAOCULAR LENS IMPLANT Left 01/2013  . COLON RESECTION  1997   Yvette Rack. notes 05/17/2004 (04/28/2013)  . COLON SURGERY  1996   cancer  . ESOPHAGOGASTRODUODENOSCOPY N/A 09/05/2013   Procedure: ESOPHAGOGASTRODUODENOSCOPY (EGD);  Surgeon: Lear Ng, MD;  Location: [facility] ENDOSCOPY;  Service: Endoscopy;  Laterality: N/A;  . FEMORAL ARTERY - FEMORAL ARTERY BYPASS GRAFT     right to left/enc. notes  05/17/2004 (04/28/2013)  . FEMORAL BYPASS Right    /enc. notes 05/17/2004 (04/28/2013)  . FEMORAL-POPLITEAL BYPASS GRAFT Right 04/29/2013   Procedure:  FEMORAL-POPLITEAL ARTERY Bypass Graft with intraoperative ultrasound and arteriogarm;  Surgeon: Mal Misty, MD;  Location: [place];  Service: Vascular;  Laterality: Right;  . FIBEROPTIC BRONCHOSCOPY     Yvette Rack. notes 03/16/2005 (04/28/2013)  . INTRAOPERATIVE ARTERIOGRAM Right 04/29/2013   Procedure: INTRA OPERATIVE ARTERIOGRAM;  Surgeon: Mal Misty, MD;  Location: [place];  Service: Vascular;  Laterality: Right;  . LOWER EXTREMITY ANGIOGRAM  04/20/2013   Procedure: LOWER EXTREMITY ANGIOGRAM;  Surgeon: Angelia Mould, MD;  Location: [facility] CATH LAB;  Service: Cardiovascular;;  . LOWER EXTREMITY ANGIOGRAPHY Bilateral 06/25/2017   Procedure: LOWER EXTREMITY ANGIOGRAPHY;  Surgeon: Serafina Mitchell, MD;  Location: [place] CV LAB;  Service: Cardiovascular;  Laterality: Bilateral;  . [place] SURGERY  2005  . PERCUTANEOUS STENT INTERVENTION  04/20/2013   Procedure: PERCUTANEOUS STENT INTERVENTION;  Surgeon: Angelia Mould, MD;  Location[facility] CATH LAB;  Service: Cardiovascular;;  right common iliac artery  . PERIPHERAL VASCULAR CATHETERIZATION N/A 01/31/2016   Procedure: Abdominal Aortogram w/Lower Extremity;  Surgeon: Serafina Mitchell, MD;  Location: [place] CV LAB;  Service: Cardiovascular;  Laterality: N/A;  . PERIPHERAL VASCULAR CATHETERIZATION Right 01/31/2016   Procedure: Peripheral Vascular Atherectomy;  Surgeon: Serafina Mitchell, MD;  Location: [place] CV LAB;  Service: Cardiovascular;  Laterality: Right;  Iliac common and external  . PERIPHERAL VASCULAR CATHETERIZATION Right 01/31/2016   Procedure: Peripheral Vascular Intervention;  Surgeon: Serafina Mitchell, MD;  Location: [facility] CV LAB;  Service: Cardiovascular;  Laterality: Right;  external iliac and common iliac  . PERIPHERAL VASCULAR CATHETERIZATION N/A 04/24/2016   Procedure:  Abdominal Aortogram w/Lower Extremity;  Surgeon: Serafina Mitchell, MD;  Location: [place] CV LAB;  Service: Cardiovascular;  Laterality: N/A;  . PERIPHERAL VASCULAR CATHETERIZATION Right 04/24/2016   Procedure: Peripheral Vascular Intervention;  Surgeon: Serafina Mitchell, MD;  Location: [place] CV LAB;  Service: Cardiovascular;  Laterality: Right;  external illiac Rt  . SPINE SURGERY    . VASCULAR SURGERY          Home Medications    Prior to Admission medications   Medication Sig Start Date End Date Taking? Authorizing Provider  acetaminophen (TYLENOL) 500 MG tablet Take 500 mg by mouth every 6 (six) hours as needed for mild pain.     [provider]  albuterol (PROVENTIL) (2.5 MG/3ML) 0.083% nebulizer solution Take 2.5 mg by nebulization every 6 (six) hours as needed for wheezing or shortness of breath.    [provider]  bisacodyl (DULCOLAX) 10 MG suppository Place 10 mg rectally daily as needed for moderate constipation (If Milk of Magnesia doesn't work).    [provider]  BREO ELLIPTA 100-25 MCG/INH AEPB TAKE 1 PUFF BY MOUTH EVERY DAY 09/09/17   Tanda Rockers, MD  clopidogrel (PLAVIX) 75 MG tablet TAKE 1 TABLET BY MOUTH DAILY. Patient taking differently: TAKE 75 MG BY MOUTH DAILY. 05/28/17   Waynetta Sandy, MD  co-enzyme Q-10 30 MG capsule Take  30 mg by mouth at bedtime.    [provider]  famotidine (PEPCID) 20 MG tablet One at bedtime Patient taking differently: Take 20 mg by mouth daily. One at bedtime 04/26/17   Tanda Rockers, MD  furosemide (LASIX) 20 MG tablet Take 1 tablet (20 mg total) by mouth every Monday, Wednesday, and Friday. 10/02/17   Florencia Reasons, MD  GLUCERNA [facility]) LIQD Take 237 mLs by mouth 3 (three) times daily with meals. Chocolate    [provider]  hydrALAZINE (APRESOLINE) 10 MG tablet Take 1 tablet (10 mg total) by mouth 3 (three) times daily. 10/01/17   Rai, Vernelle Emerald, MD  magnesium hydroxide (MILK  OF MAGNESIA) 400 MG/5ML suspension Take 30 mLs by mouth daily as needed for mild constipation.    [provider]  metoprolol tartrate (LOPRESSOR) 25 MG tablet Take 1 tablet (25 mg total) by mouth 2 (two) times daily. 10/01/17   Rai, Vernelle Emerald, MD  multivitamin-iron-minerals-folic acid (CENTRUM) chewable tablet Chew 1 tablet by mouth at bedtime.    [provider]  omega-3 acid ethyl esters (LOVAZA) 1 g capsule Take by mouth at bedtime.    [provider]  ondansetron (ZOFRAN) 4 MG tablet Take 1 tablet (4 mg total) by mouth every 6 (six) hours as needed for nausea. 10/01/17   Rai, Ripudeep Raliegh Ip, MD  pravastatin (PRAVACHOL) 80 MG tablet Take 80 mg by mouth daily. 07/17/17   [provider]  sitaGLIPtin (JANUVIA) 100 MG tablet Take 100 mg by mouth daily.    [provider]  Sodium Phosphates (RA SALINE ENEMA) 19-7 GM/118ML ENEM Place 1 application rectally daily as needed (If Milk of Magnesia and Bisacodyl don't work).    [provider]    Family History Family History  Problem Relation Age of Onset  . Stroke Mother 38  . Hypertension Mother   . Cancer Sister   . Heart disease Sister   . Heart disease Brother        Heart Disease before age 66  . Heart attack Brother   . Cancer Sister   . Cancer Brother     Social History Social History   Tobacco Use  . Smoking status: Former Smoker    Packs/day: 0.30    Years: 40.00    Pack years: 12.00    Types: Cigarettes    Last attempt to quit: 03/26/1993    Years since quitting: 24.6  . Smokeless tobacco: Never Used  Substance Use Topics  . Alcohol use: No  . Drug use: No     Allergies   Patient has no known allergies.   Review of Systems Review of Systems  Unable to perform ROS: Patient unresponsive     Physical Exam Updated Vital Signs BP (!) 206/86   Pulse (!) 110   Temp (!) 84.7 F (29.3 C) (Temporal)   Resp 19   Wt 72.6 kg   SpO2 (!) 47%   BMI 21.12 kg/m   Physical  Exam  Constitutional: He appears well-developed and well-nourished. He appears distressed.  HENT:  Head: Normocephalic.  Mouth/Throat: Oropharynx is clear and moist. No oropharyngeal exudate.  Eyes: Pupils are equal, round, and reactive to light.  Neck: Neck supple.  Cardiovascular: Intact distal pulses. Bradycardia present.  No murmur heard. Pulmonary/Chest: No stridor. He is in respiratory distress. He has wheezes. He has rhonchi. He has no rales.  Wheezing in all lung fields.  Breath sounds decreased on right versus left with wheezing.  Also rhonchi in all lung fields.  Abdominal: He exhibits no distension. There is no tenderness.  Musculoskeletal: He exhibits no edema or tenderness.  Neurological: He is unresponsive. He displays no tremor. GCS eye subscore is 1. GCS verbal subscore is 1. GCS motor subscore is 3.  GCS was 4 on arrival.  Skin: Capillary refill takes less than 2 seconds. He is not diaphoretic. No erythema.  Nursing note and vitals reviewed.    ED Treatments / Results  Labs (all labs ordered are listed, but only abnormal results are displayed) Labs Reviewed  CBC WITH DIFFERENTIAL/PLATELET - Abnormal; Notable for the following components:      Result Value   WBC 11.2 (*)    RBC 3.85 (*)    Hemoglobin 10.4 (*)    HCT 35.7 (*)    MCHC 29.1 (*)    Abs Immature Granulocytes 0.5 (*)    All other components within normal limits  I-STAT TROPONIN, ED - Abnormal; Notable for the following components:   Troponin i, poc 0.31 (*)    All other components within normal limits  I-STAT CG4 LACTIC ACID, ED - Abnormal; Notable for the following components:   Lactic Acid, Venous 7.21 (*)    All other components within normal limits  CULTURE, BLOOD (ROUTINE X 2)  CULTURE, BLOOD (ROUTINE X 2)  COMPREHENSIVE METABOLIC PANEL  BRAIN NATRIURETIC PEPTIDE  MAGNESIUM    EKG None ED ECG REPORT   Date: 2017/12/15  Rate: 52   Rhythm: sinus bradycardia  QRS Axis: normal   Intervals: QT prolonged  ST/T Wave abnormalities: normal and   Conduction Disutrbances:right bundle branch block  Narrative Interpretation:   Old EKG Reviewed: new RBBB  I have personally reviewed the EKG tracing and agree with the computerized printout as noted.   Radiology No results found.  Procedures Procedure Name: Intubation Date/Time: 12/15/2017 11:38 AM Performed by: Courtney Paris, MD Pre-anesthesia Checklist: Patient identified, Patient being monitored, Emergency Drugs available, Timeout performed and Suction available Oxygen Delivery Method: Non-rebreather mask Preoxygenation: Pre-oxygenation with 100% oxygen Induction Type: Rapid sequence Ventilation: Mask ventilation without difficulty Laryngoscope Size: Glidescope Grade View: Grade I Tube size: 7.5 mm Number of attempts: 1 Placement Confirmation: ETT inserted through vocal cords under direct vision,  CO2 detector and Breath sounds checked- equal and bilateral Tube secured with: ETT holder      (including critical care time)  CRITICAL CARE Performed by: Gwenyth Allegra Myleka Moncure Total critical care time: 70 minutes Critical care time was exclusive of separately billable procedures and treating other patients. Critical care was necessary to treat or prevent imminent or life-threatening deterioration. Critical care was time spent personally by me on the following activities: development of treatment plan with patient and/or surrogate as well as nursing, discussions with consultants, evaluation of patient's response to treatment, examination of patient, obtaining history from patient or surrogate, ordering and performing treatments and interventions, ordering and review of laboratory studies, ordering and review of radiographic studies, pulse oximetry and re-evaluation of patient's condition.   Medications Ordered in ED Medications  ipratropium-albuterol (DUONEB) 0.5-2.5 (3) MG/3ML nebulizer solution 3 mL (3  mLs Nebulization Not Given Dec 15, 2017 1045)  methylPREDNISolone sodium succinate (SOLU-MEDROL) 125 mg/2 mL injection 125 mg (has no administration in time range)  albuterol (PROVENTIL) (2.5 MG/3ML) 0.083% nebulizer solution (  Not Given 12-15-2017 1116)  norepinephrine (LEVOPHED) 4mg  in D5W 237mL premix infusion (has no administration in time range)  EPINEPHrine (ADRENALIN) 1 MG/10ML injection (1 mg Intravenous Given 2017/12/15 1103)  etomidate (AMIDATE) injection (20 mg Intravenous Given 11/28/2017 1034)  rocuronium (ZEMURON) injection (50 mg Intravenous Given November 28, 2017 1034)  0.9 %  sodium chloride infusion (1,000 mLs Intravenous New Bag/Given Nov 28, 2017 1047)  amiodarone (CORDARONE) 300 mg in dextrose 5 % 100 mL bolus (300 mg Intravenous New Bag/Given 11/28/2017 1101)  albuterol (PROVENTIL) (2.5 MG/3ML) 0.083% nebulizer solution 2.5 mg (2.5 mg Nebulization Given 2017/11/28 1048)     Initial Impression / Assessment and Plan / ED Course  I have reviewed the triage vital signs and the nursing notes.  Pertinent labs & imaging results that were available during my care of the patient were reviewed by me and considered in my medical decision making (see chart for details).     [name] is a 83 y.o. male with a past medical history significant for COPD, colon cancer, Alzheimer's dementia, diabetes, congestive heart failure, hypertension, hyperlipidemia, peripheral vascular disease status post femoral artery bypass grafting, and recent pneumonia who presents post CPR.  According to EMS, patient was at a vascular appointment today when he was found to be lethargic and not acting like himself.  When he was assessed, he was found to be in PEA arrest.  According to EMS he received approximately 2 rounds of CPR with rescue breathing.  He did not require epinephrine at the time.  He had an IO placed and was started on fluids.   Upon arrival, patient remained in [place].  Patient was in a sinus rhythm.  Patient would only  withdraw from painful stimuli.  Due to his poor mental status and low GCS, decision was made to intubate patient for airway protection.  Patient was intubated without difficulty.  Patient's breath sounds were wheezing with decreased movement on the right compared to the left.  Portal chest x-ray was ordered as well as lab testing.  While speaking with the family about what had occurred thus far, I was called the patient was back in cardiac arrest.    CPR was continued on the patient.  He received 4 doses of epinephrine, amiodarone, and at one point was in pulseless V. tach.  He was shocked without ROSC.   Patient continued to have no response and had PEA arrest.  Patient was continued on CPR for period of time longer.   After he did not respond to any of our interventions and after the revelation that he has been a DNR for some time, decision to cease resuscitation efforts was made.  Time of death was called at 11:09 AM.   Family was informed of the patient's death and PCP was called, [name], who will fill out the death certificate.     Final Clinical Impressions(s) / ED Diagnoses   Final diagnoses:  Cardiac arrest ([place])     Clinical Impression: 1. Cardiac arrest [facility])     Disposition: Expired  This note was prepared with assistance of Dragon voice recognition software. Occasional wrong-word or sound-a-like substitutions may have occurred due to the inherent limitations of voice recognition software.       Elvenia Godden, Gwenyth Allegra, MD 11/28/17 1139

## 2017-11-24 NOTE — ED Notes (Signed)
MD speaking with family at this time.

## 2017-11-24 NOTE — ED Triage Notes (Signed)
Per [facility]: Patient to ED from [facility] with witnessed cardiac arrest. Patient appeared lethargic just prior to arrest. Fire reported PEA at 9:45am, manual CPR initiated with ROSC at 9:53am with sinus rhythm with PVCs. IO placed in L tibia - given 100c NS PTA. No epi or shocks PTA. Patient did have L sided gaze, but with sternal rub, eyes returned midline. Breathing spontaneously en route - nasopharyngeal airway in place on arrival to ED - SpO2 91% on 15L NRB. EMS VS: HR 84, 140/100, CBG 146.

## 2017-11-24 NOTE — Code Documentation (Signed)
MD performed bilateral needle decompression - SpO2 initially improved but decreasing back to 40s at this time.

## 2017-11-24 NOTE — Code Documentation (Addendum)
Patient time of death occurred at 65.

## 2017-11-24 NOTE — ED Notes (Signed)
Pt's family at bedside- given belongings

## 2017-11-24 NOTE — Progress Notes (Signed)
Chaplain responded to the request by the ED to to escort the family of the patient who was transported via EMS to the ED. The family was taken to the Consult rm, where the Dr provide for the wife and friend the patient's current medical status. Chaplain will follow up at a later time.  Upon follow up to the ED Chaplain informed that the patient had died. The family had already the the hospital. Chaplain Yaakov Guthrie [phone]

## 2017-11-24 NOTE — Progress Notes (Signed)
Patient coded in the vascular lab.  Chest compressions were performed until EMS arrived.  He had a palpable pulse at that time.  He was unresponsive.  Wife updated.

## 2017-11-24 DEATH — deceased

## 2020-01-15 IMAGING — CR DG ANKLE COMPLETE 3+V*L*
3 series · 3 of 3 positions shown · non-contrast
Comparison: None.

CLINICAL DATA: Pain after fall

EXAM:
LEFT ANKLE COMPLETE - 3+ VIEW

[x ankle ap left]
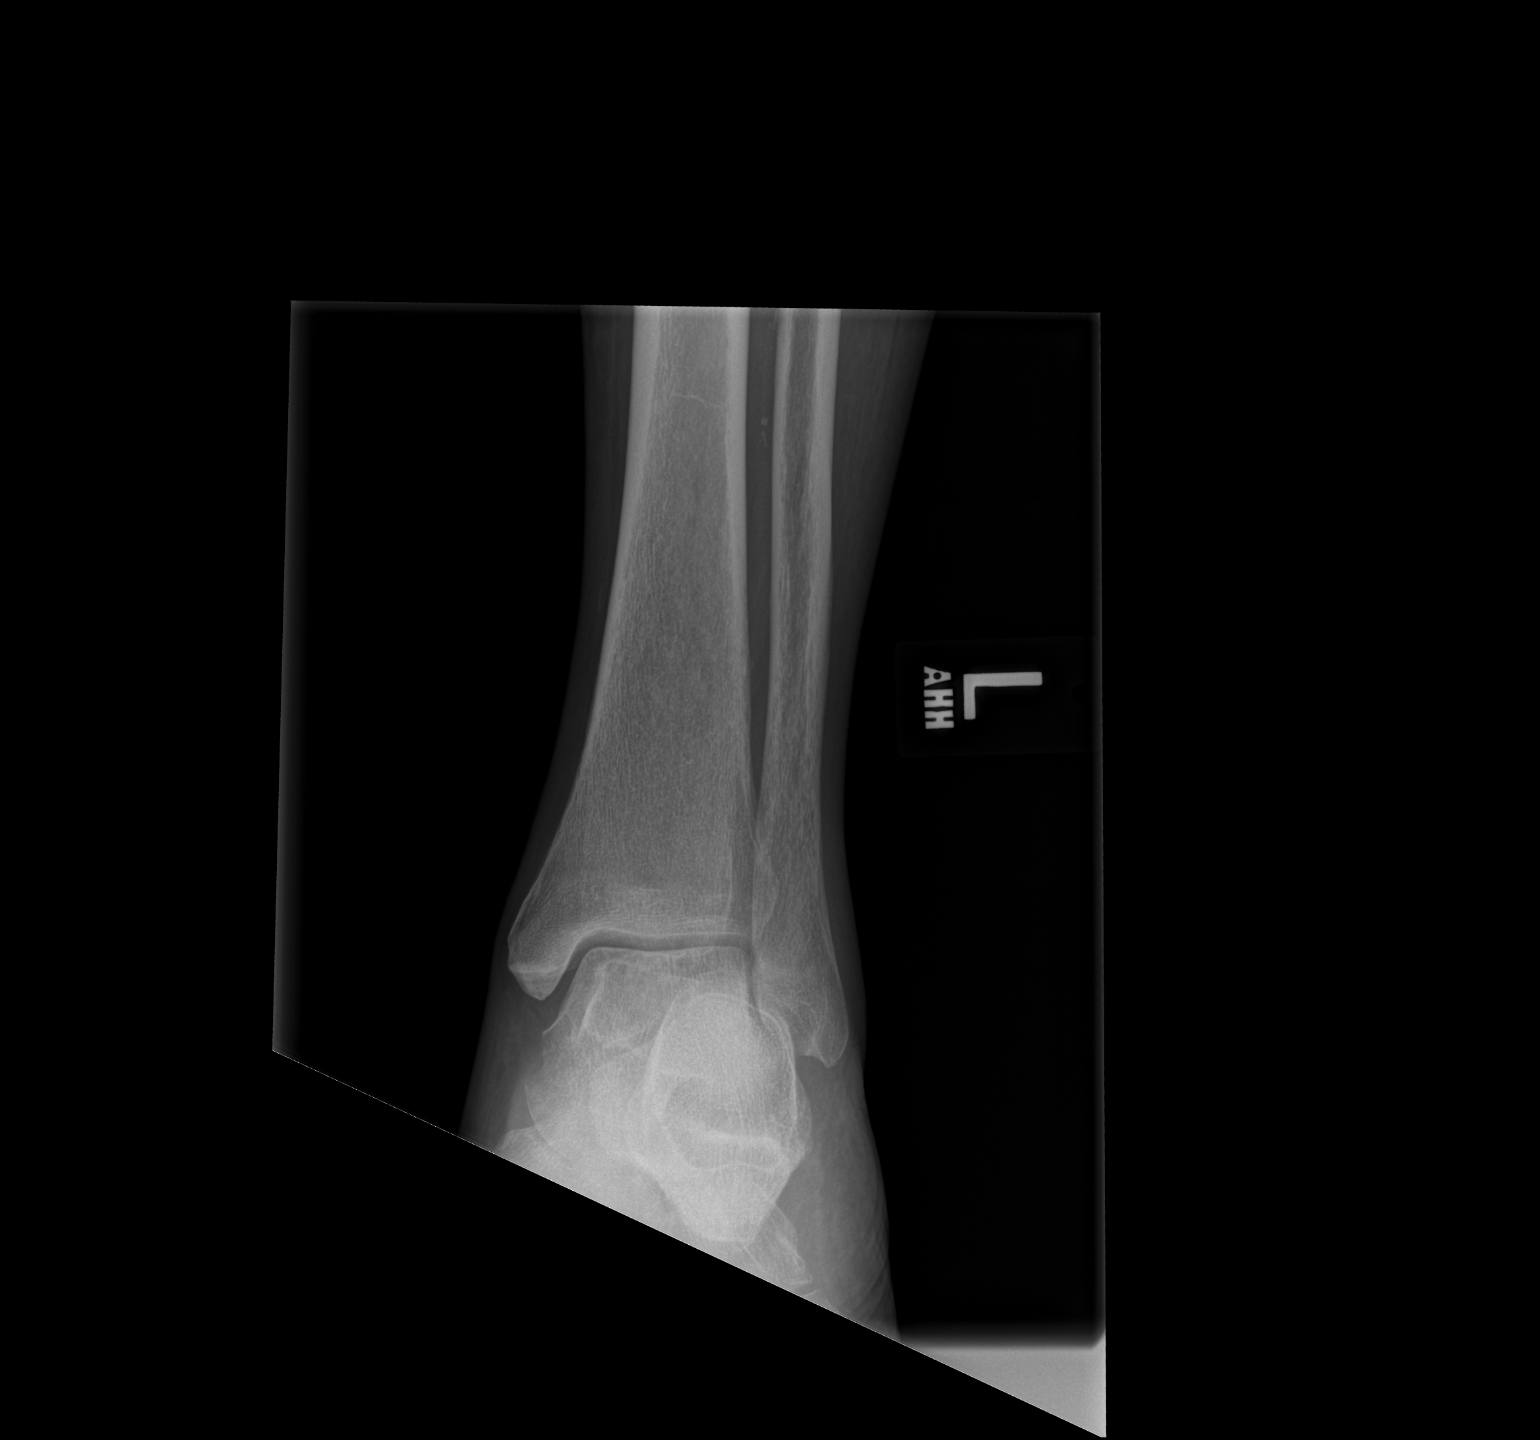

[x ankle obl left]
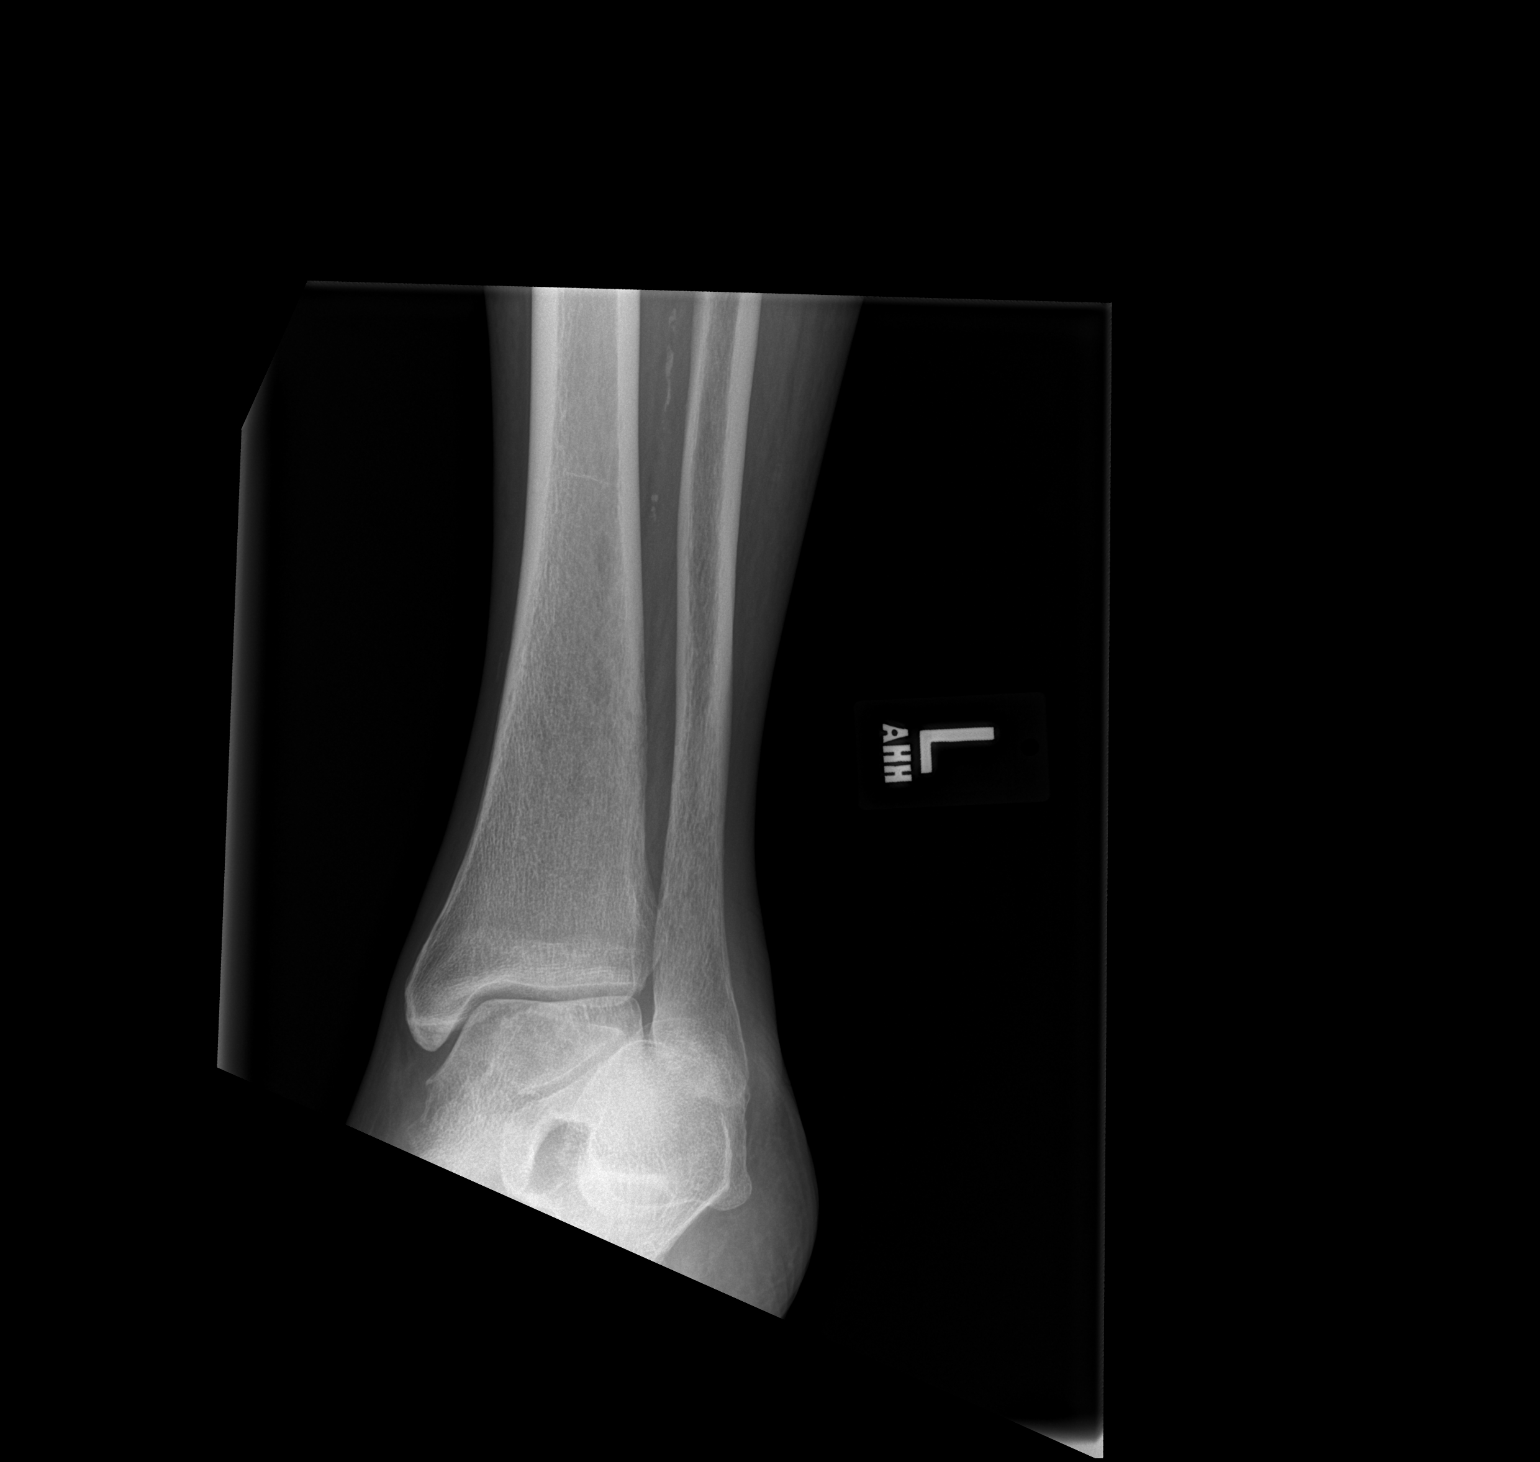

[x ankle lat left]
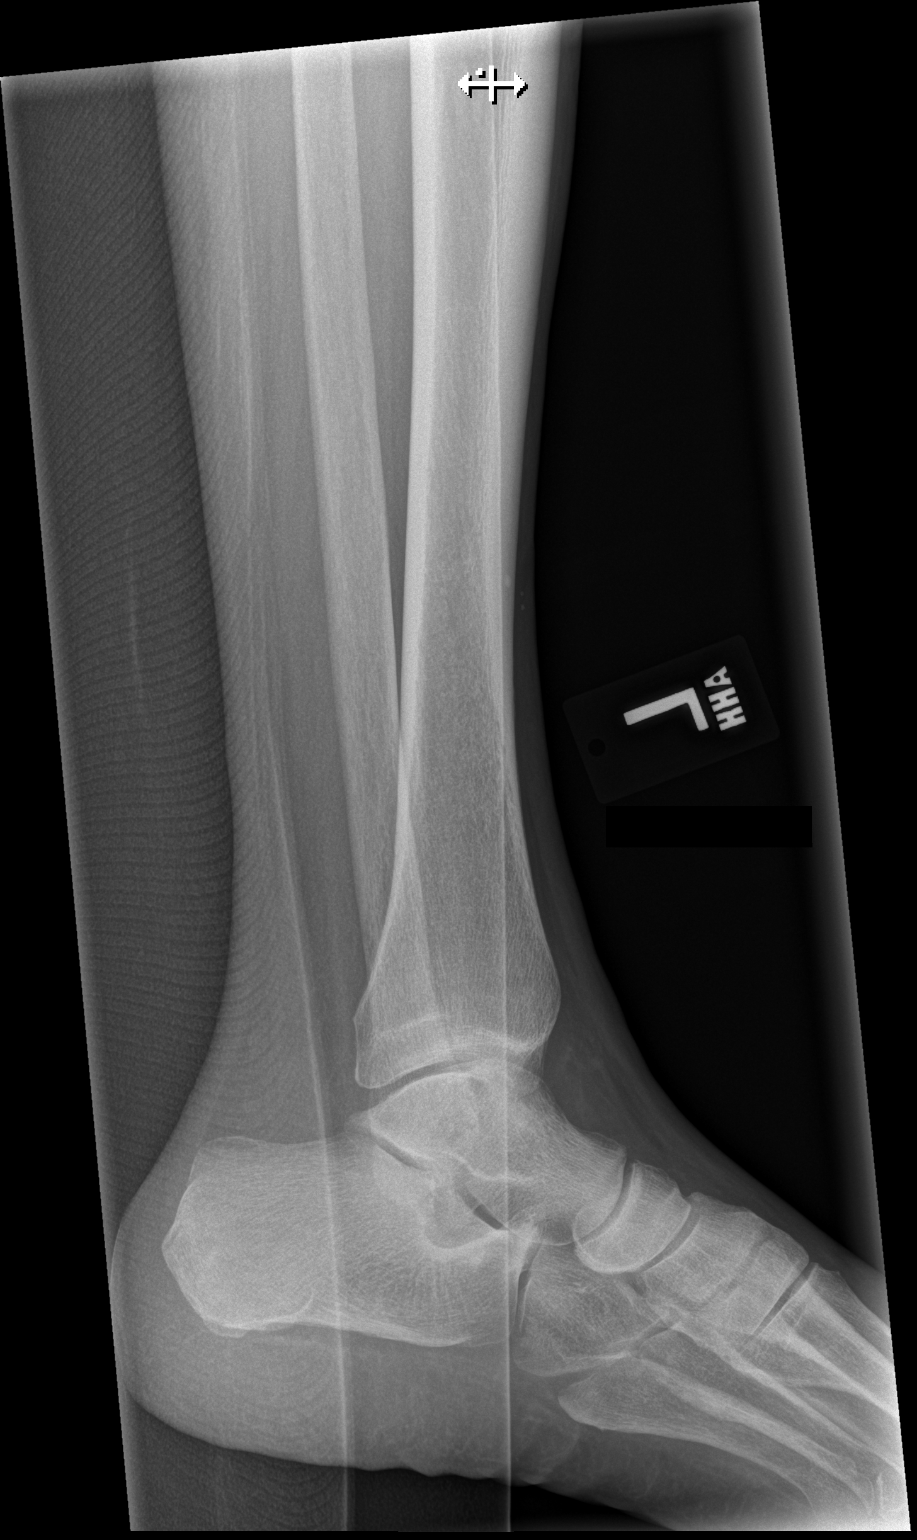

[3 of 3 positions shown; findings below may reference images not displayed]

FINDINGS: There is no evidence of fracture, dislocation, or joint effusion.
There is no evidence of arthropathy or other focal bone abnormality.
Soft tissues are unremarkable.
IMPRESSION: Negative.
